# Patient Record
Sex: Female | Born: 1975
Health system: Southern US, Community
[De-identification: ages and names within clinical notes are randomized; demographics above are authoritative.]

## PROBLEM LIST (undated history)

## (undated) DIAGNOSIS — M1990 Unspecified osteoarthritis, unspecified site: Secondary | ICD-10-CM

## (undated) DIAGNOSIS — E785 Hyperlipidemia, unspecified: Secondary | ICD-10-CM

## (undated) DIAGNOSIS — K219 Gastro-esophageal reflux disease without esophagitis: Secondary | ICD-10-CM

## (undated) DIAGNOSIS — E119 Type 2 diabetes mellitus without complications: Secondary | ICD-10-CM

## (undated) DIAGNOSIS — Z800 Family history of malignant neoplasm of digestive organs: Secondary | ICD-10-CM

## (undated) DIAGNOSIS — Z1589 Genetic susceptibility to other disease: Secondary | ICD-10-CM

## (undated) DIAGNOSIS — Z8639 Personal history of other endocrine, nutritional and metabolic disease: Secondary | ICD-10-CM

## (undated) DIAGNOSIS — F32A Depression, unspecified: Secondary | ICD-10-CM

## (undated) DIAGNOSIS — D649 Anemia, unspecified: Secondary | ICD-10-CM

## (undated) DIAGNOSIS — C50919 Malignant neoplasm of unspecified site of unspecified female breast: Secondary | ICD-10-CM

## (undated) DIAGNOSIS — I10 Essential (primary) hypertension: Secondary | ICD-10-CM

## (undated) DIAGNOSIS — T7840XA Allergy, unspecified, initial encounter: Secondary | ICD-10-CM

## (undated) DIAGNOSIS — F339 Major depressive disorder, recurrent, unspecified: Secondary | ICD-10-CM

## (undated) DIAGNOSIS — F419 Anxiety disorder, unspecified: Secondary | ICD-10-CM

## (undated) DIAGNOSIS — R519 Headache, unspecified: Secondary | ICD-10-CM

## (undated) DIAGNOSIS — Z923 Personal history of irradiation: Secondary | ICD-10-CM

## (undated) HISTORY — PX: COLONOSCOPY: SHX174

## (undated) HISTORY — PX: BREAST CYST EXCISION: SHX579

## (undated) HISTORY — DX: Hyperlipidemia, unspecified: E78.5

## (undated) HISTORY — DX: Personal history of other endocrine, nutritional and metabolic disease: Z86.39

## (undated) HISTORY — DX: Family history of malignant neoplasm of digestive organs: Z80.0

## (undated) HISTORY — DX: Genetic susceptibility to other disease: Z15.89

## (undated) HISTORY — PX: THYROIDECTOMY, PARTIAL: SHX18

## (undated) HISTORY — PX: UPPER GASTROINTESTINAL ENDOSCOPY: SHX188

## (undated) HISTORY — DX: Malignant neoplasm of unspecified site of unspecified female breast: C50.919

## (undated) HISTORY — DX: Type 2 diabetes mellitus without complications: E11.9

## (undated) HISTORY — DX: Personal history of irradiation: Z92.3

## (undated) HISTORY — DX: Major depressive disorder, recurrent, unspecified: F33.9

## (undated) HISTORY — DX: Essential (primary) hypertension: I10

## (undated) HISTORY — DX: Allergy, unspecified, initial encounter: T78.40XA

## (undated) MED FILL — Dexamethasone Sodium Phosphate Inj 100 MG/10ML: INTRAMUSCULAR | Qty: 1 | Status: AC

---

## 2018-02-27 ENCOUNTER — Other Ambulatory Visit: Payer: Self-pay | Admitting: Family Medicine

## 2018-02-27 DIAGNOSIS — Z1231 Encounter for screening mammogram for malignant neoplasm of breast: Secondary | ICD-10-CM

## 2020-01-12 ENCOUNTER — Other Ambulatory Visit: Payer: Self-pay

## 2020-01-12 ENCOUNTER — Emergency Department (HOSPITAL_COMMUNITY)
Admission: EM | Admit: 2020-01-12 | Discharge: 2020-01-12 | Discharge status: Against Medical Advice | Payer: Self-pay | Attending: Emergency Medicine | Admitting: Emergency Medicine

## 2020-01-12 ENCOUNTER — Encounter (HOSPITAL_COMMUNITY): Payer: Self-pay | Admitting: Emergency Medicine

## 2020-01-12 DIAGNOSIS — R1084 Generalized abdominal pain: Secondary | ICD-10-CM | POA: Insufficient documentation

## 2020-01-12 DIAGNOSIS — Z5321 Procedure and treatment not carried out due to patient leaving prior to being seen by health care provider: Secondary | ICD-10-CM | POA: Insufficient documentation

## 2020-01-12 LAB — COMPREHENSIVE METABOLIC PANEL
ALT: 12 U/L (ref 0–44)
AST: 13 U/L — ABNORMAL LOW (ref 15–41)
Albumin: 3.8 g/dL (ref 3.5–5.0)
Alkaline Phosphatase: 58 U/L (ref 38–126)
Anion gap: 7 (ref 5–15)
BUN: 7 mg/dL (ref 6–20)
CO2: 25 mmol/L (ref 22–32)
Calcium: 8.6 mg/dL — ABNORMAL LOW (ref 8.9–10.3)
Chloride: 108 mmol/L (ref 98–111)
Creatinine, Ser: 0.67 mg/dL (ref 0.44–1.00)
GFR calc Af Amer: >60 mL/min (ref >60)
GFR calc non Af Amer: >60 mL/min (ref >60)
Glucose, Bld: 150 mg/dL — ABNORMAL HIGH (ref 70–99)
Potassium: 3.7 mmol/L (ref 3.5–5.1)
Sodium: 140 mmol/L (ref 135–145)
Total Bilirubin: 0.7 mg/dL (ref 0.3–1.2)
Total Protein: 7 g/dL (ref 6.5–8.1)

## 2020-01-12 LAB — CBC
HCT: 35.2 % — ABNORMAL LOW (ref 36.0–46.0)
Hemoglobin: 10.5 g/dL — ABNORMAL LOW (ref 12.0–15.0)
MCH: 25.1 pg — ABNORMAL LOW (ref 26.0–34.0)
MCHC: 29.8 g/dL — ABNORMAL LOW (ref 30.0–36.0)
MCV: 84 fL (ref 80.0–100.0)
Platelets: 277 10*3/uL (ref 150–400)
RBC: 4.19 MIL/uL (ref 3.87–5.11)
RDW: 15.8 % — ABNORMAL HIGH (ref 11.5–15.5)
WBC: 9.3 10*3/uL (ref 4.0–10.5)
nRBC: 0 % (ref 0.0–0.2)

## 2020-01-12 LAB — I-STAT BETA HCG BLOOD, ED (MC, WL, AP ONLY): I-stat hCG, quantitative: <5 m[IU]/mL (ref <5)

## 2020-01-12 LAB — LIPASE, BLOOD: Lipase: 23 U/L (ref 11–51)

## 2020-01-12 MED ORDER — SODIUM CHLORIDE 0.9% FLUSH: 3.0000 mL | Freq: Once | INTRAVENOUS | Status: DC

## 2020-01-12 NOTE — ED Notes (Addendum)
Called pt X3 no answer looked in bathrooms and outside of ED doors no answer as well.

## 2020-01-12 NOTE — ED Triage Notes (Signed)
Pt in w/generalized low abdominal pain and cramping x 5 days. Denies any n/v/d, just feels "tight". No constipation reported. Pain radiates to low back

## 2020-06-25 ENCOUNTER — Other Ambulatory Visit: Payer: Self-pay

## 2020-06-25 DIAGNOSIS — N6310 Unspecified lump in the right breast, unspecified quadrant: Secondary | ICD-10-CM

## 2020-07-07 ENCOUNTER — Ambulatory Visit: Payer: Self-pay | Admitting: *Deleted

## 2020-07-07 ENCOUNTER — Other Ambulatory Visit: Payer: Self-pay

## 2020-07-07 VITALS — BP 110/64 | Temp 97.8°F | Wt 207.0 lb

## 2020-07-07 DIAGNOSIS — Z1239 Encounter for other screening for malignant neoplasm of breast: Secondary | ICD-10-CM

## 2020-07-07 DIAGNOSIS — N6310 Unspecified lump in the right breast, unspecified quadrant: Secondary | ICD-10-CM

## 2020-07-07 DIAGNOSIS — N644 Mastodynia: Secondary | ICD-10-CM

## 2020-07-07 NOTE — Patient Instructions (Signed)
Explained breast self awareness with Armando Reichert. Patient did not need a Pap smear today due to last Pap smear and HPV typing was 05/26/2020. Let her know BCCCP will cover Pap smears and HPV typing every 5 years unless has a history of abnormal Pap smears. Referred patient to the [facility] for a diagnostic mammogram. Appointment scheduled Tuesday, July 13, 2020 at 0910. Patient aware of appointment and will be there. Discussed smoking cessation with patient. Referred to the [facility] Quitline and gave resources to the free smoking cessation classes at [facility]. Halleigh Comes verbalized understanding.  Romar Woodrick, Arvil Chaco, RN 1:56 PM

## 2020-07-07 NOTE — Progress Notes (Signed)
Ms. Melody Rodriguez is a 44 y.o. female who presents to [facility] clinic today with complaint of right breast lump x 2 years that has been increasing size. Patient complained of right breast pain x one year that is constant. Patient rates the pain at a 7 out of 10. Patient complained of bilateral nipple discharge when expresses x 10 years. Patient states the discharge is bloody to pus like within the left breast and pus like right breast.    Pap Smear: Pap smear not completed today. Last Pap smear was 05/26/2020 at the [facility] and was normal with negative HPV per patient. Per patient unsure if she has had an abnormal Pap smear. Last Pap smear result is not available in Epic. Previous Pap smear result from 01/01/2015 is in [place].   Physical exam: Breasts Breasts symmetrical. No skin abnormalities bilateral breasts. No nipple retraction bilateral breasts. No nipple discharge bilateral breasts. Unable to express any nipple discharge on exam. No lymphadenopathy. No lumps palpated left breast. Palpated a lump within the right breast between 9 o'clock and 12 o'clock 2 cm from the nipple. Patient complained of tenderness when palpated right breast lump.   Pelvic/Bimanual Pap is not indicated today per BCCCP guidelines.   Smoking History: Patient is a current smoker. Discussed smoking cessation with patient. Referred to the [name] Quitline and gave resources to the free smoking cessation classes at [facility].   Patient Navigation: Patient education provided. Access to services provided for patient through BCCCP program.    Breast and Cervical Cancer Risk Assessment: Patient does not have family history of breast cancer, known genetic mutations, or radiation treatment to the chest before age 30. Patient does not have history of cervical dysplasia, immunocompromised, or DES exposure in-utero.  Risk Assessment    Risk Scores      07/07/2020   Last edited by: Demetrius Revel, LPN   5-year risk: 0.8  %   Lifetime risk: 9.4 %          A: BCCCP exam without pap smear Complaint of right breast lump and pain.  P: Referred patient to the [facility] for a diagnostic mammogram. Appointment scheduled Tuesday, July 13, 2020 at 0910.  Loletta Parish, RN 07/07/2020 1:57 PM

## 2020-07-13 ENCOUNTER — Ambulatory Visit
Admission: RE | Admit: 2020-07-13 | Discharge: 2020-07-13 | Disposition: A | Discharge status: Home / Self Care | Payer: Medicaid Other | Source: Ambulatory Visit | Attending: Obstetrics and Gynecology | Admitting: Obstetrics and Gynecology

## 2020-07-13 ENCOUNTER — Other Ambulatory Visit: Payer: Self-pay | Admitting: Obstetrics and Gynecology

## 2020-07-13 DIAGNOSIS — N6310 Unspecified lump in the right breast, unspecified quadrant: Secondary | ICD-10-CM

## 2020-07-23 ENCOUNTER — Other Ambulatory Visit: Payer: Self-pay | Admitting: Obstetrics and Gynecology

## 2020-07-23 ENCOUNTER — Ambulatory Visit
Admission: RE | Admit: 2020-07-23 | Discharge: 2020-07-23 | Disposition: A | Discharge status: Home / Self Care | Payer: Medicaid Other | Source: Ambulatory Visit | Attending: Obstetrics and Gynecology | Admitting: Obstetrics and Gynecology

## 2020-07-23 ENCOUNTER — Ambulatory Visit
Admission: RE | Admit: 2020-07-23 | Discharge: 2020-07-23 | Disposition: A | Discharge status: Home / Self Care | Payer: No Typology Code available for payment source | Source: Ambulatory Visit | Attending: Obstetrics and Gynecology | Admitting: Obstetrics and Gynecology

## 2020-07-23 ENCOUNTER — Other Ambulatory Visit: Payer: Self-pay

## 2020-07-23 DIAGNOSIS — N6310 Unspecified lump in the right breast, unspecified quadrant: Secondary | ICD-10-CM

## 2020-07-26 ENCOUNTER — Telehealth: Payer: Self-pay | Admitting: *Deleted

## 2020-07-26 ENCOUNTER — Encounter: Payer: Self-pay | Admitting: *Deleted

## 2020-07-26 NOTE — Telephone Encounter (Signed)
Spoke with patient to discuss [facility] for 11/17 at 815am.  Discussed navigation resources and gave information regarding clinic. Patient verbalized understanding. Will email and mail packet for patient to fill out.

## 2020-07-27 ENCOUNTER — Telehealth: Payer: Self-pay

## 2020-07-27 NOTE — Telephone Encounter (Addendum)
Attempted to contact patient regarding BCCCP medicaid. Left message on voicemail requesting return call.   Patient returned call, application was completed, plans to come by [facility] tomorrow to sign Medicaid form. Patient has a very positive disposition, but is doubtful about having a supportive person/family members. Patient was encouraged to discuss with family members/friends, and to call back as needed. Patient stated she was meeting with some family members this week, and plans to discuss her diagnosis with her family members.

## 2020-07-28 ENCOUNTER — Other Ambulatory Visit: Payer: Self-pay | Admitting: *Deleted

## 2020-07-28 DIAGNOSIS — C50411 Malignant neoplasm of upper-outer quadrant of right female breast: Secondary | ICD-10-CM | POA: Insufficient documentation

## 2020-07-28 DIAGNOSIS — Z170 Estrogen receptor positive status [ER+]: Secondary | ICD-10-CM | POA: Insufficient documentation

## 2020-08-03 NOTE — Progress Notes (Signed)
[facility] NOTE  Patient Care Team: Patient, [name] as PCP - General (General Practice) Rockwell Germany, RN as Oncology Nurse Navigator Mauro Kaufmann, RN as Oncology Nurse Navigator [name], MD as Consulting Physician (General Surgery) Nicholas Lose, MD as Consulting Physician (Hematology and Oncology) Gery Pray, MD as Consulting Physician (Radiation Oncology)  CHIEF COMPLAINTS/PURPOSE OF CONSULTATION:  Newly diagnosed breast cancer  HISTORY OF PRESENTING ILLNESS:  Melody Rodriguez 44 y.o. female is here because of recent diagnosis of invasive ductal carcinoma of the right breast. Patient palpated a right breast mass for 1-2 years. Mammogram and [place] on 07/13/20 showed a 2.2cm mass at the 11 o'clock position with surrounding calcifications, 6.4cm in total extent, and up to 5 abnormal right axillary lymph nodes. Biopsy on 07/23/20 showed invasive and in situ ductal carcinoma in the breast and axilla, grade 2, HER-2 equivocal by IHC (2+), negative by FISH (ratio 1.6), ER+ 50% weak, PR+ 20%, Ki67 20%. She presents to the clinic today for initial evaluation and discussion of treatment options.   I reviewed her records extensively and collaborated the history with the patient.  SUMMARY OF ONCOLOGIC HISTORY: Oncology History  Malignant neoplasm of upper-outer quadrant of right breast in female, estrogen receptor positive ([place])  07/28/2020 Initial Diagnosis   Patient palpated a right breast mass for 1-2 years. Mammogram showed a 2.2cm mass at the 11 o'clock position with surrounding calcifications, 6.4cm in total extent, and up to 5 abnormal right axillary lymph nodes. 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      MEDICAL HISTORY:  Past Medical History:  Diagnosis Date  . Breast cancer [facility])     SURGICAL  HISTORY: Past Surgical History:  Procedure Laterality Date  . BREAST CYST EXCISION Right    Patient does not recall (2014 or 2015)  . CESAREAN SECTION    . THYROIDECTOMY, PARTIAL      SOCIAL HISTORY: Social History   Socioeconomic History  . Marital status: Single    Spouse name: Not on file  . Number of children: 2  . Years of education: Not on file  . Highest education level: Some college, no degree  Occupational History  . Not on file  Tobacco Use  . Smoking status: Current Every Day Smoker    Packs/day: 1.00    Types: Cigars  . Smokeless tobacco: Never Used  Vaping Use  . Vaping Use: Never used  Substance and Sexual Activity  . Alcohol use: Yes  . Drug use: Yes    Types: Marijuana  . Sexual activity: Not Currently  Other Topics Concern  . Not on file  Social History Narrative  . Not on file   Social Determinants of Health   Financial Resource Strain:   . Difficulty of Paying Living Expenses: Not on file  Food Insecurity:   . Worried About Charity fundraiser in the Last Year: Not on file  . Ran Out of Food in the Last Year: Not on file  Transportation Needs: No Transportation Needs  . Lack of Transportation (Medical): No  . Lack of Transportation (Non-Medical): No  Physical Activity:   . Days of Exercise per Week: Not on file  . Minutes of Exercise per Session: Not on file  Stress:   . Feeling of Stress : Not on file  Social  Connections:   . Frequency of Communication with Friends and Family: Not on file  . Frequency of Social Gatherings with Friends and Family: Not on file  . Attends Religious Services: Not on file  . Active Member of Clubs or Organizations: Not on file  . Attends Archivist Meetings: Not on file  . Marital Status: Not on file  Intimate Partner Violence:   . Fear of Current or Ex-Partner: Not on file  . Emotionally Abused: Not on file  . Physically Abused: Not on file  . Sexually Abused: Not on file    FAMILY  HISTORY: Family History  Problem Relation Age of Onset  . Hypertension Mother   . Colon cancer Sister   . Diabetes Maternal Grandmother     ALLERGIES:  is allergic to latex.  MEDICATIONS:  Current Outpatient Medications  Medication Sig Dispense Refill  . tamoxifen (NOLVADEX) 20 MG tablet Take 1 tablet (20 mg total) by mouth daily. 90 tablet 3   No current facility-administered medications for this visit.    REVIEW OF SYSTEMS:   Constitutional: Denies fevers, chills or abnormal night sweats All other systems were reviewed with the patient and are negative.  PHYSICAL EXAMINATION: ECOG PERFORMANCE STATUS: 1 - Symptomatic but completely ambulatory  Vitals:   08/04/20 0936  BP: 127/73  Pulse: 78  Resp: 18  Temp: 98.3 F (36.8 C)  SpO2: 99%   Filed Weights   08/04/20 0936  Weight: 204 lb (92.5 kg)      LABORATORY DATA:  I have reviewed the data as listed Lab Results  Component Value Date   WBC 7.0 08/04/2020   HGB 9.6 (L) 08/04/2020   HCT 31.6 (L) 08/04/2020   MCV 77.3 (L) 08/04/2020   PLT 290 08/04/2020   Lab Results  Component Value Date   NA 141 08/04/2020   K 3.4 (L) 08/04/2020   CL 108 08/04/2020   CO2 24 08/04/2020    RADIOGRAPHIC STUDIES: I have personally reviewed the radiological reports and agreed with the findings in the report.  ASSESSMENT AND PLAN:  Malignant neoplasm of upper-outer quadrant of right breast in female, estrogen receptor positive ([place]) 07/28/2020:Patient palpated a right breast mass for 1-2 years. Mammogram showed a 2.2cm mass at the 11 o'clock position with surrounding calcifications, 6.4cm in total extent, and up to 5 abnormal right axillary lymph nodes. 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   Pathology and radiology counseling: Discussed with the patient, the details of pathology including  the type of breast cancer,the clinical staging, the significance of ER, PR and HER-2/neu receptors and the implications for treatment. After reviewing the pathology in detail, we proceeded to discuss the different treatment options between surgery, radiation, chemotherapy, antiestrogen therapies.  Treatment plan: 1.  Neoadjuvant therapy (neoadjuvant antiestrogen therapy versus neoadjuvant chemotherapy based upon MammaPrint test results) 2. mastectomy versus breast conserving surgery with targeted node dissection 3.  Adjuvant radiation therapy 4.  Follow-up adjuvant antiestrogen therapy  Tamoxifen counseling:We discussed the risks and benefits of tamoxifen. These include but not limited to insomnia, hot flashes, mood changes, vaginal dryness, and weight gain. Although rare, serious side effects including endometrial cancer, risk of blood clots were also discussed. We strongly believe that the benefits far outweigh the risks. Patient understands these risks and consented to starting treatment.    Heavy menses will  refer her to gynecology.  Iron deficiency anemia: I recommend administering IV iron therapy. We will obtain iron studies and ferritin. Since her mother has a history of von Willebrand disease, we will obtain von Willebrand factor panel.   All questions were answered. The patient knows to call the clinic with any problems, questions or concerns.    Rulon Eisenmenger, MD, MPH 08/04/2020    I, Molly Dorshimer, am acting as scribe for Nicholas Lose, MD.  I have reviewed the above documentation for accuracy and completeness, and I agree with the above.

## 2020-08-04 ENCOUNTER — Encounter: Payer: Self-pay | Admitting: Hematology and Oncology

## 2020-08-04 ENCOUNTER — Ambulatory Visit (HOSPITAL_BASED_OUTPATIENT_CLINIC_OR_DEPARTMENT_OTHER): Payer: No Typology Code available for payment source | Admitting: Genetic Counselor

## 2020-08-04 ENCOUNTER — Inpatient Hospital Stay: Payer: Medicaid Other

## 2020-08-04 ENCOUNTER — Encounter: Payer: Self-pay | Admitting: Genetic Counselor

## 2020-08-04 ENCOUNTER — Encounter: Payer: Self-pay | Admitting: Physical Therapy

## 2020-08-04 ENCOUNTER — Encounter: Payer: Self-pay | Admitting: *Deleted

## 2020-08-04 ENCOUNTER — Inpatient Hospital Stay: Payer: Medicaid Other | Attending: Hematology and Oncology | Admitting: Hematology and Oncology

## 2020-08-04 ENCOUNTER — Inpatient Hospital Stay: Payer: Medicaid Other | Admitting: Licensed Clinical Social Worker

## 2020-08-04 ENCOUNTER — Other Ambulatory Visit: Payer: Self-pay

## 2020-08-04 ENCOUNTER — Other Ambulatory Visit: Payer: Self-pay | Admitting: *Deleted

## 2020-08-04 ENCOUNTER — Ambulatory Visit: Payer: No Typology Code available for payment source

## 2020-08-04 ENCOUNTER — Ambulatory Visit: Payer: Medicaid Other | Attending: Surgery | Admitting: Physical Therapy

## 2020-08-04 ENCOUNTER — Ambulatory Visit
Admission: RE | Admit: 2020-08-04 | Discharge: 2020-08-04 | Disposition: A | Discharge status: Home / Self Care | Payer: Medicaid Other | Source: Ambulatory Visit | Attending: Radiation Oncology | Admitting: Radiation Oncology

## 2020-08-04 VITALS — BP 127/73 | HR 78 | Temp 98.3°F | Resp 18 | Ht 65.0 in | Wt 204.0 lb

## 2020-08-04 DIAGNOSIS — R3589 Other polyuria: Secondary | ICD-10-CM | POA: Diagnosis not present

## 2020-08-04 DIAGNOSIS — D500 Iron deficiency anemia secondary to blood loss (chronic): Secondary | ICD-10-CM

## 2020-08-04 DIAGNOSIS — C50411 Malignant neoplasm of upper-outer quadrant of right female breast: Secondary | ICD-10-CM

## 2020-08-04 DIAGNOSIS — Z800 Family history of malignant neoplasm of digestive organs: Secondary | ICD-10-CM

## 2020-08-04 DIAGNOSIS — R293 Abnormal posture: Secondary | ICD-10-CM | POA: Diagnosis present

## 2020-08-04 DIAGNOSIS — D508 Other iron deficiency anemias: Secondary | ICD-10-CM | POA: Diagnosis not present

## 2020-08-04 DIAGNOSIS — Z79810 Long term (current) use of selective estrogen receptor modulators (SERMs): Secondary | ICD-10-CM | POA: Insufficient documentation

## 2020-08-04 DIAGNOSIS — Z833 Family history of diabetes mellitus: Secondary | ICD-10-CM | POA: Insufficient documentation

## 2020-08-04 DIAGNOSIS — R58 Hemorrhage, not elsewhere classified: Secondary | ICD-10-CM

## 2020-08-04 DIAGNOSIS — Z170 Estrogen receptor positive status [ER+]: Secondary | ICD-10-CM | POA: Insufficient documentation

## 2020-08-04 DIAGNOSIS — Z8249 Family history of ischemic heart disease and other diseases of the circulatory system: Secondary | ICD-10-CM | POA: Diagnosis not present

## 2020-08-04 DIAGNOSIS — N920 Excessive and frequent menstruation with regular cycle: Secondary | ICD-10-CM | POA: Diagnosis not present

## 2020-08-04 DIAGNOSIS — F17210 Nicotine dependence, cigarettes, uncomplicated: Secondary | ICD-10-CM | POA: Diagnosis not present

## 2020-08-04 LAB — CBC WITH DIFFERENTIAL (CANCER CENTER ONLY)
Abs Immature Granulocytes: 0.03 10*3/uL (ref 0.00–0.07)
Basophils Absolute: 0.1 10*3/uL (ref 0.0–0.1)
Basophils Relative: 1 %
Eosinophils Absolute: 0.3 10*3/uL (ref 0.0–0.5)
Eosinophils Relative: 4 %
HCT: 31.6 % — ABNORMAL LOW (ref 36.0–46.0)
Hemoglobin: 9.6 g/dL — ABNORMAL LOW (ref 12.0–15.0)
Immature Granulocytes: 0 %
Lymphocytes Relative: 26 %
Lymphs Abs: 1.8 10*3/uL (ref 0.7–4.0)
MCH: 23.5 pg — ABNORMAL LOW (ref 26.0–34.0)
MCHC: 30.4 g/dL (ref 30.0–36.0)
MCV: 77.3 fL — ABNORMAL LOW (ref 80.0–100.0)
Monocytes Absolute: 0.3 10*3/uL (ref 0.1–1.0)
Monocytes Relative: 5 %
Neutro Abs: 4.5 10*3/uL (ref 1.7–7.7)
Neutrophils Relative %: 64 %
Platelet Count: 290 10*3/uL (ref 150–400)
RBC: 4.09 MIL/uL (ref 3.87–5.11)
RDW: 17.2 % — ABNORMAL HIGH (ref 11.5–15.5)
WBC Count: 7 10*3/uL (ref 4.0–10.5)
nRBC: 0 % (ref 0.0–0.2)

## 2020-08-04 LAB — CMP (CANCER CENTER ONLY)
ALT: 10 U/L (ref 0–44)
AST: 13 U/L — ABNORMAL LOW (ref 15–41)
Albumin: 3.7 g/dL (ref 3.5–5.0)
Alkaline Phosphatase: 59 U/L (ref 38–126)
Anion gap: 9 (ref 5–15)
BUN: 10 mg/dL (ref 6–20)
CO2: 24 mmol/L (ref 22–32)
Calcium: 8.6 mg/dL — ABNORMAL LOW (ref 8.9–10.3)
Chloride: 108 mmol/L (ref 98–111)
Creatinine: 0.77 mg/dL (ref 0.44–1.00)
GFR, Estimated: >60 mL/min (ref >60)
Glucose, Bld: 191 mg/dL — ABNORMAL HIGH (ref 70–99)
Potassium: 3.4 mmol/L — ABNORMAL LOW (ref 3.5–5.1)
Sodium: 141 mmol/L (ref 135–145)
Total Bilirubin: 0.3 mg/dL (ref 0.3–1.2)
Total Protein: 6.9 g/dL (ref 6.5–8.1)

## 2020-08-04 LAB — IRON AND TIBC
Iron: 20 ug/dL — ABNORMAL LOW (ref 41–142)
Saturation Ratios: 5 % — ABNORMAL LOW (ref 21–57)
TIBC: 420 ug/dL (ref 236–444)
UIBC: 400 ug/dL — ABNORMAL HIGH (ref 120–384)

## 2020-08-04 LAB — FERRITIN: Ferritin: <4 ng/mL — ABNORMAL LOW (ref 11–307)

## 2020-08-04 LAB — GENETIC SCREENING ORDER

## 2020-08-04 MED ORDER — TAMOXIFEN CITRATE 20 MG PO TABS
20.0000 mg | ORAL_TABLET | Freq: Every day | ORAL | 3 refills | Status: DC
Start: 2020-08-04 — End: 2020-08-05

## 2020-08-04 NOTE — Progress Notes (Signed)
[place] Work  Initial Assessment   Melody Rodriguez is a 44 y.o. year old female presenting by herself. Clinical Social Work was referred by [facility] for assessment of psychosocial needs.   SDOH (Social Determinants of Health) assessments performed: Yes SDOH Interventions     Most Recent Value  SDOH Interventions  Food Insecurity Interventions Intervention Not Indicated  Financial Strain Interventions Development worker, community, Other (Comment)  [breast cancer foundations,  BCCCP Medicaid pending]      Distress Screen completed: Yes ONCBCN DISTRESS SCREENING 08/04/2020  Screening Type Initial Screening  Distress experienced in past week (1-10) 7  Practical problem type Insurance;Work/school;Childcare  Family Problem type Other (comment)  Emotional problem type Depression;Boredom  Spiritual/Religous concerns type Relating to God  Information Concerns Type Lack of info about diagnosis;Lack of info about treatment;Lack of info about complementary therapy choices  Physical Problem type Pain;Sleep/insomnia;Breathing;Loss of appetitie;Talking;Constipation/diarrhea;Changes in urination;Tingling hands/feet;Skin dry/itchy;Swollen arms/legs;Other (comment)      Family/Social Information:  . Housing Arrangement: patient lives with 10yo son who has Autism. 41 yo son lives in [place] . Family members/support persons in your life? Extremely limited. Has some family (mom, aunts) but they live in [place] . Transportation concerns: no  . Employment: Working part time for [employer]. Income source: Employment and son's SSI . Financial concerns: Yes, current concerns and will worsen if missing working hours due to treatment o Type of concern: Utilities, Rent/ mortgage, Medical bills and Care giving (Child care or elder care services) . Food access concerns: no, receiving enough SNAP benefits at this time . Medication Concerns: yes, worried about cost since BCCCP Medicaid is pending approval   . Services Currently in place:  SNAP, SSI (for son)  Coping/ Adjustment to diagnosis: . Patient understands treatment plan and what happens next? Understands current plan although is waiting on results from Mammaprint and MRI to determine exact plan . Concerns about diagnosis and/or treatment: How I will care for other members of my family and How will I pay for living expenses when missing work . 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 Hopes and priorities: priority is making sure her son is taken care of . Current coping skills/ strengths: Capable of independent living    SUMMARY: Current SDOH Barriers:  . Financial constraints related to losing hours for work due to treatment . Limited social support  Clinical Social Work Clinical Goal(s):  Marland Kitchen Patient will follow-up on resources provided by CSW for financial assistance  Interventions: . Discussed common feeling and emotions when being diagnosed with cancer, and the importance of support during treatment . Informed patient of the support team roles and support services at [facility] . Provided CSW contact information and encouraged patient to call with any questions or concerns . Provided patient with information about breast cancer foundations   Follow Up Plan: CSW will follow-up with patient by phone  and Patient will work on applications for breast cancer foundations Patient verbalizes understanding of plan: Yes    Christeen Douglas LCSW

## 2020-08-04 NOTE — Progress Notes (Signed)
REFERRING PROVIDER: Nicholas Lose, MD [place],  [place] [id number]  PRIMARY PROVIDER:  Patient, [name]  PRIMARY REASON FOR VISIT:  1. Malignant neoplasm of upper-outer quadrant of right breast in female, estrogen receptor positive ([place])   2. Family history of colon cancer      I connected with Melody Rodriguez on 08/04/2020 at 12:15 pm EDT by video conference and verified that I am speaking with the correct person using two identifiers.   Patient location: [facility] clinic Provider location: [facility] office  HISTORY OF PRESENT ILLNESS:   Melody Rodriguez, a 44 y.o. female, was seen for a [facility] cancer genetics consultation at the request of Dr. Lindi Adie due to a personal and family history of cancer.  Melody Rodriguez presents to clinic today to discuss the possibility of a hereditary predisposition to cancer, genetic testing, and to further clarify her future cancer risks, as well as potential cancer risks for family members.   In 2021, at the age of 18, Melody Rodriguez was diagnosed with invasive ductal carcinoma and ductal carcinoma in situ, ER+/PR+/Her2-, of the right breast. The treatment plan includes surgery, radiation therapy, and antiestrogen therapy.    CANCER HISTORY:  Oncology History  Malignant neoplasm of upper-outer quadrant of right breast in female, estrogen receptor positive ([place])  07/28/2020 Initial Diagnosis   Patient palpated a right breast mass for 1-2 years. Mammogram showed a 2.2cm mass at the 11 o'clock position with surrounding calcifications, 6.4cm in total extent, and up to 5 abnormal right axillary lymph nodes. 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       Past Medical History:  Diagnosis Date  . Breast cancer ([place])   . Family history of colon cancer     Past Surgical History:  Procedure Laterality Date  .  BREAST CYST EXCISION Right    Patient does not recall (2014 or 2015)  . CESAREAN SECTION    . THYROIDECTOMY, PARTIAL      Social History   Socioeconomic History  . Marital status: Single    Spouse name: Not on file  . Number of children: 2  . Years of education: Not on file  . Highest education level: Some college, no degree  Occupational History  . Not on file  Tobacco Use  . Smoking status: Current Every Day Smoker    Packs/day: 1.00    Types: Cigars  . Smokeless tobacco: Never Used  Vaping Use  . Vaping Use: Never used  Substance and Sexual Activity  . Alcohol use: Yes  . Drug use: Yes    Types: Marijuana  . Sexual activity: Not Currently  Other Topics Concern  . Not on file  Social History Narrative  . Not on file   Social Determinants of Health   Financial Resource Strain:   . Difficulty of Paying Living Expenses: Not on file  Food Insecurity:   . Worried About Charity fundraiser in the Last Year: Not on file  . Ran Out of Food in the Last Year: Not on file  Transportation Needs: No Transportation Needs  . Lack of Transportation (Medical): No  . Lack of Transportation (Non-Medical): No  Physical Activity:   . Days of Exercise per Week: Not on file  . Minutes of Exercise per Session: Not on file  Stress:   . Feeling  of Stress : Not on file  Social Connections:   . Frequency of Communication with Friends and Family: Not on file  . Frequency of Social Gatherings with Friends and Family: Not on file  . Attends Religious Services: Not on file  . Active Member of Clubs or Organizations: Not on file  . Attends Archivist Meetings: Not on file  . Marital Status: Not on file     FAMILY HISTORY:  We obtained a detailed, 4-generation family history.  Significant diagnoses are listed below: Family History  Problem Relation Age of Onset  . Hypertension Mother   . Colon cancer Sister 10  . Diabetes Maternal Grandmother   . Cancer Maternal Aunt         unknown type dx late 40s   Melody Rodriguez has two sons (ages 7 and 50). She has one sister (age 42) who has a history of colon cancer diagnosed at the age of 58. She does not know if her sister has had genetic testing.  Melody Rodriguez mother is 36 and has not had cancer. Melody Rodriguez has two maternal uncles and six maternal aunts. One aunt was diagnosed with cancer in her late 42s, although Melody Rodriguez does not know what type of cancer this was. Her maternal grandmother died at age 14 and her maternal grandfather died in his early 16s.  Melody Rodriguez father is 58 and has not had cancer. She does not know if she has any paternal aunts or uncles. Her paternal grandmother died older than 56, and she does not know how old her paternal grandfather was when he died.  Melody Rodriguez is [place] of previous family history of genetic testing for hereditary cancer risks. Patient's ancestors are of Black/African American, Native American, and White/Caucasian descent. There is no reported Ashkenazi Jewish ancestry. There is no known consanguinity.  GENETIC COUNSELING ASSESSMENT: Melody Rodriguez is a 44 y.o. female with a personal history of young-onset breast cancer and a family history of young-onset colon cancer, which is somewhat suggestive of a hereditary cancer syndrome and predisposition to cancer. We, therefore, discussed and recommended the following at today's visit.   DISCUSSION: We discussed that approximately 5-10% of breast cancer is hereditary, with most cases associated with the BRCA1 and BRCA2 genes. There are other genes that can be associated with hereditary breast cancer syndromes. These include ATM, CHEK2, PALB2, etc. We discussed that testing is beneficial for several reasons, including knowing about other cancer risks, identifying potential screening and risk-reduction options that may be appropriate, and to understand if other family members could be at risk for cancer and allow them to undergo genetic  testing.  We reviewed the characteristics, features and inheritance patterns of hereditary cancer syndromes. We also discussed genetic testing, including the appropriate family members to test, the process of testing, insurance coverage and turn-around-time for results. We discussed the implications of a negative, positive and/or variant of uncertain significant result. In order to get genetic test results in a timely manner so that Melody Rodriguez can use these genetic test results for surgical decisions, we recommended Melody Rodriguez pursue genetic testing for the [facility] Breast Cancer STAT panel. Once complete, we recommend Melody Rodriguez pursue reflex genetic testing to the Common Hereditary Cancers panel.   The Breast Cancer STAT Panel offered by [facility] includes sequencing and deletion/duplication analysis for the following 9 genes:  ATM, BRCA1, BRCA2, CDH1, CHEK2, PALB2, PTEN, STK11 and TP53. The Common Hereditary Cancers Panel offered by [facility] includes sequencing and/or  deletion duplication testing of the following 48 genes: APC, ATM, AXIN2, BARD1, BMPR1A, BRCA1, BRCA2, BRIP1, CDH1, CDK4, CDKN2A (p14ARF), CDKN2A (p16INK4a), CHEK2, CTNNA1, DICER1, EPCAM (Deletion/duplication testing only), GREM1 (promoter region deletion/duplication testing only), KIT, MEN1, MLH1, MSH2, MSH3, MSH6, MUTYH, NBN, NF1, NTHL1, PALB2, PDGFRA, PMS2, POLD1, POLE, PTEN, RAD50, RAD51C, RAD51D, RNF43, SDHB, SDHC, SDHD, SMAD4, SMARCA4. STK11, TP53, TSC1, TSC2, and VHL.  The following genes were evaluated for sequence changes only: SDHA and HOXB13 c.251G>A variant only.  Based on MelodyRodriguez's personal and family history of cancer, she meets medical criteria for genetic testing. Despite that she meets criteria, she may still have an out of pocket cost. We discussed that she qualifies for [facility]'s patient assistance program, which will waive the cost of her genetic testing if she provides her most recent federal tax return form. Otherwise, the  out of pocket cost may be $250.   PLAN: After considering the risks, benefits, and limitations, Melody Rodriguez provided informed consent to pursue genetic testing and the blood sample was sent to [facility] for analysis of the Breast Cancer STAT panel + Common Hereditary Cancers panel. Results should be available within approximately one-two weeks' time, at which point they will be disclosed by telephone to Melody Rodriguez, as will any additional recommendations warranted by these results. Melody Rodriguez will receive a summary of her genetic counseling visit and a copy of her results once available. This information will also be available in Epic.   Ms. Milholland questions were answered to her satisfaction today. Our contact information was provided should additional questions or concerns arise. Thank you for the referral and allowing [place] to share in the care of your patient.   Clint Guy, [place], [facility] Licensed, Certified Dispensing [email or web address] Phone: [phone]  The patient was seen for a total of 20 minutes in face-to-face genetic counseling.  This patient was discussed with Drs. Magrinat, Lindi Adie and/or Burr Medico who agrees with the above.    _______________________________________________________________________ For Office Staff:  Number of people involved in session: 1 Was an Intern/ student involved with case: no

## 2020-08-04 NOTE — Progress Notes (Signed)
Radiation Oncology         [phone] ________________________________  Multidisciplinary Breast Oncology Clinic [facility]) Initial Outpatient Consultation  Name: Melody Rodriguez MRN: [id number]  Date: 08/04/2020  DOB: 14-Apr-1976  YN:[id number], [name]  [name], MD   REFERRING PHYSICIAN: [name], MD  DIAGNOSIS: The encounter diagnosis was Malignant neoplasm of upper-outer quadrant of right breast in female, estrogen receptor positive ([place]).  Stage T2, N1, Mx Right Breast UOQ, Invasive Ductal Carcinoma with DCIS, ER+ / PR+ / Her2-, Grade 2    ICD-10-CM   1. Malignant neoplasm of upper-outer quadrant of right breast in female, estrogen receptor positive ([place])  C50.411    Z17.0     HISTORY OF PRESENT ILLNESS::Melody Rodriguez is a 44 y.o. female who is presenting to the office today for evaluation of her newly diagnosed breast cancer.  She underwent bilateral diagnostic mammography with tomography and right breast ultrasonography at [facility] on 07/13/2020 for evaluation of 1-2 year history of palpable right breast mass. Results showed a highly suspicious 2.2 cm mass in the right breast at the 11 o'clock position. There were calcifications within the mass that extended posteriorly and laterally to the mass, which altogether spanned 6.3 cm. Ultrasound of the right axilla demonstrated two clearly abnormal lymph nodes and three additional possible abnormal lymph nodes. There was no evidence of left breast malignancy.  Biopsy on 07/23/2020 showed grade 2 invasive ductal carcinoma and ductal carcinoma in situ with a right axillary lymph node that was positive for invasive carcinoma. Prognostic indicators were significant for: estrogen receptor, 50% positive with a weak staining intensity and progesterone receptor, 20% positive with a strong staining intensity. Proliferation marker Ki67 at 20%. HER2 negative.  Menarche: 44 years old Age at first live birth: 44years old GP:  2 LMP: 08/01/2020 Contraceptive: None HRT: None   The patient was referred today for presentation in the multidisciplinary conference.  Radiology studies and pathology slides were presented there for review and discussion of treatment options.  A consensus was discussed regarding potential next steps.  PREVIOUS RADIATION THERAPY: No  PAST MEDICAL HISTORY:  Past Medical History:  Diagnosis Date  . Breast cancer ([place])   . Family history of colon cancer     PAST SURGICAL HISTORY: Past Surgical History:  Procedure Laterality Date  . BREAST CYST EXCISION Right    Patient does not recall (2014 or 2015)  . CESAREAN SECTION    . THYROIDECTOMY, PARTIAL      FAMILY HISTORY:  Family History  Problem Relation Age of Onset  . Hypertension Mother   . Colon cancer Sister 31  . Diabetes Maternal Grandmother   . Cancer Maternal Aunt        unknown type dx late 1s    SOCIAL HISTORY:  Social History   Socioeconomic History  . Marital status: Single    Spouse name: Not on file  . Number of children: 2  . Years of education: Not on file  . Highest education level: Some college, no degree  Occupational History  . Not on file  Tobacco Use  . Smoking status: Current Every Day Smoker    Packs/day: 1.00    Types: Cigars  . Smokeless tobacco: Never Used  Vaping Use  . Vaping Use: Never used  Substance and Sexual Activity  . Alcohol use: Yes  . Drug use: Yes    Types: Marijuana  . Sexual activity: Not Currently  Other Topics Concern  . Not on file  Social History Narrative  . Not on file   Social Determinants of Health   Financial Resource Strain: High Risk  . Difficulty of Paying Living Expenses: Hard  Food Insecurity: No Food Insecurity  . Worried About Charity fundraiser in the Last Year: Never true  . Ran Out of Food in the Last Year: Never true  Transportation Needs: No Transportation Needs  . Lack of Transportation (Medical): No  . Lack of Transportation  (Non-Medical): No  Physical Activity:   . Days of Exercise per Week: Not on file  . Minutes of Exercise per Session: Not on file  Stress:   . Feeling of Stress : Not on file  Social Connections:   . Frequency of Communication with Friends and Family: Not on file  . Frequency of Social Gatherings with Friends and Family: Not on file  . Attends Religious Services: Not on file  . Active Member of Clubs or Organizations: Not on file  . Attends Archivist Meetings: Not on file  . Marital Status: Not on file    ALLERGIES:  Allergies  Allergen Reactions  . Latex Swelling    Itching, swelling    MEDICATIONS:  Current Outpatient Medications  Medication Sig Dispense Refill  . tamoxifen (NOLVADEX) 20 MG tablet Take 1 tablet (20 mg total) by mouth daily. 90 tablet 3   No current facility-administered medications for this encounter.    REVIEW OF SYSTEMS: A 10+ POINT REVIEW OF SYSTEMS WAS OBTAINED including neurology, dermatology, psychiatry, cardiac, respiratory, lymph, extremities, GI, GU, musculoskeletal, constitutional, reproductive, HEENT. On the provided form, she reports night sweats, fatigue, generalized pain and muscle aches, wearing glasses and contacts, ear drainage, tinnitus, rhinorrhea, sinus problems, hoarse voice, chest pain, pedal edema, shortness of breath with rest/walking/stairs, sleeping with 3-4 pillows, cough, heartburn, change in stool habits, abdominal pain, breast pain, lump in breast, nipple discharge, breast dimpling, rash, back pain, joint pain, headaches, weakness, numbness, depression, thyroid problem, hot flashes, and anemia. She denies dysuria and any other symptoms.    PHYSICAL EXAM:   Vitals with BMI 08/04/2020  Height _0   Weight 204 lbs  BMI 86.57  Systolic 846  Diastolic 73  Pulse 78   Lungs are clear to auscultation bilaterally. Heart has regular rate and rhythm. No palpable cervical, supraclavicular, or axillary adenopathy. Abdomen soft,  non-tender, normal bowel sounds. Left breast with no palpable mass, nipple discharge, or bleeding.  Right breast with a palpable mass in the periareolar area that measured approximately 2.0 - 2.5 cm in size. There was no nipple discharge or bleeding. No palpable adenopathy.   KPS = 90  100 - Normal; no complaints; no evidence of disease. 90   - Able to carry on normal activity; minor signs or symptoms of disease. 80   - Normal activity with effort; some signs or symptoms of disease. 77   - Cares for self; unable to carry on normal activity or to do active work. 60   - Requires occasional assistance, but is able to care for most of his personal needs. 50   - Requires considerable assistance and frequent medical care. 86   - Disabled; requires special care and assistance. 60   - Severely disabled; hospital admission is indicated although death not imminent. 83   - Very sick; hospital admission necessary; active supportive treatment necessary. 10   - Moribund; fatal processes progressing rapidly. 0     - Dead  Karnofsky DA, Abelmann WH, Craver LS and  Burchenal JH [phone]) The use of the nitrogen mustards in the palliative treatment of carcinoma: with particular reference to bronchogenic carcinoma Cancer 1 634-56  LABORATORY DATA:  Lab Results  Component Value Date   WBC 7.0 08/04/2020   HGB 9.6 (L) 08/04/2020   HCT 31.6 (L) 08/04/2020   MCV 77.3 (L) 08/04/2020   PLT 290 08/04/2020   Lab Results  Component Value Date   NA 141 08/04/2020   K 3.4 (L) 08/04/2020   CL 108 08/04/2020   CO2 24 08/04/2020   Lab Results  Component Value Date   ALT 10 08/04/2020   AST 13 (L) 08/04/2020   ALKPHOS 59 08/04/2020   BILITOT 0.3 08/04/2020    PULMONARY FUNCTION TEST:   Recent Review Flowsheet Data   There is no flowsheet data to display.     RADIOGRAPHY: US BREAST LTD UNI RIGHT INC AXILLA  Result Date: 07/13/2020 CLINICAL DATA:  44 year old female presenting for evaluation of a  palpable lump in the right breast present for 1-2 years. EXAM: DIGITAL DIAGNOSTIC BILATERAL MAMMOGRAM WITH TOMO AND CAD; ULTRASOUND RIGHT BREAST LIMITED COMPARISON:  None. The patient apparently has prior mammograms, but they are unable to be retrieved. ACR Breast Density Category b: There are scattered areas of fibroglandular density. FINDINGS: Spot compression tomosynthesis images through the lateral middle depth of the right breast demonstrates a highly suspicious spiculated mass measuring approximately 2.5 cm. There are associated amorphous and fine punctate calcifications throughout the mass and extending into the lateral posterior right breast. All together, the mass and the calcifications span about 6.4 cm. No suspicious calcifications, masses or areas of distortion are seen in the left breast. Mammographic images were processed with CAD. Ultrasound targeted to the right breast at 11 o'clock, 4 cm from the nipple demonstrates an irregular hypoechoic vascular mass measuring 2.2 x 1.9 x 2.0 cm. Ultrasound of the right axilla demonstrates at least 2 clearly abnormal and 3 suspicious but borderline lymph nodes. IMPRESSION: 1. There is a highly suspicious 2.2 cm mass in the right breast at 11 o'clock. There are calcifications within the mass and extending posterior and lateral to the mass, which altogether spans about 6.4 cm. 2. Ultrasound of the right axilla demonstrates 2 clearly abnormal lymph nodes, and 3 additional possible abnormal lymph nodes. 3.  No evidence of left breast malignancy. RECOMMENDATION: 1. Ultrasound-guided biopsy is recommended for the right breast mass at 11 o'clock and the most abnormal appearing right axillary lymph node. 2. Stereotactic biopsy is recommended for a distant site of calcifications in the upper-outer right breast. These procedures have been scheduled for 07/23/2020 at 7:30 a.m. I have discussed the findings and recommendations with the patient. If applicable, a reminder  letter will be sent to the patient regarding the next appointment. BI-RADS CATEGORY  5: Highly suggestive of malignancy. Electronically Signed   By: Ammie Ferrier M.D.   On: 07/13/2020 11:28   [place] AXILLARY NODE CORE BIOPSY RIGHT  Addendum Date: 07/26/2020   ADDENDUM REPORT: 07/26/2020 13:26 ADDENDUM: Pathology revealed GRADE II INVASIVE DUCTAL CARCINOMA, DUCTAL CARCINOMA IN SITU of the Right breast, upper outer. This was found to be concordant by Dr. Dorise Bullion. Pathology revealed INVASIVE CARCINOMA of the Right axillary lymph node. There is invasive carcinoma with no residual lymph node tissue present. This was found to be concordant by Dr. Dorise Bullion. Pathology results were discussed with the patient by telephone. The patient reported doing well after the biopsies with tenderness at the sites. Post biopsy  instructions and care were reviewed and questions were answered. The patient was encouraged to call [facility] for any additional concerns. My direct phone number was provided. The patient was referred to [facility] on August 04, 2020. Recommendation for a bilateral breast MRI for extent to exclude any additional sites of disease. NOTE: It is important to note the patient was also scheduled for stereotactic biopsy of calcifications extending posterolateral to the Right breast mass. On the diagnostic mammogram, there are clearly very subtle calcifications extending posterolateral to the Right breast mass. However, these calcifications are not well seen on non magnified views. With imaging obtained for the stereotactic biopsy on November 5, there were no definitive groups of calcifications on imaging to correlate with the magnified views. While there are clearly calcifications in this region on magnified views, they are not reliably seen on non magnified views. As a result, the stereotactic  biopsy was canceled on July 23, 2020. If the patient wishes to pursue breast conservation, recommend a breast MRI. If the MRI shows a clear target to demonstrate extent of disease, recommend MRI guided biopsy. If the MRI does not show a clear target to demonstrate extent of disease, we should re-attempt the stereotactic biopsy. If the patient pursues a mastectomy, no additional biopsies are necessary. Pathology results reported by Terie Purser, RN on 07/26/2020. Electronically Signed   By: Dorise Bullion III M.D   On: 07/26/2020 13:26   Result Date: 07/26/2020 CLINICAL DATA:  Biopsy of a right breast mass and a right axillary lymph node EXAM: ULTRASOUND GUIDED RIGHT BREAST CORE NEEDLE BIOPSIES COMPARISON:  Previous exam(s). PROCEDURE: I met with the patient and we discussed the procedure of ultrasound-guided biopsy, including benefits and alternatives. We discussed the high likelihood of a successful procedure. We discussed the risks of the procedure, including infection, bleeding, tissue injury, clip migration, and inadequate sampling. Informed written consent was given. The usual time-out protocol was performed immediately prior to the procedure. Lesion quadrant: Upper outer quadrant Using sterile technique and 1% Lidocaine as local anesthetic, under direct ultrasound visualization, a 12 gauge spring-loaded device was used to perform biopsy of a mass in the upper outer quadrant of the right breast using a lateral approach. At the conclusion of the procedure a ribbon shaped tissue marker clip was deployed into the biopsy cavity. Follow up 2 view mammogram was performed and dictated separately. Lesion quadrant: Right axilla Using sterile technique and 1% Lidocaine as local anesthetic, under direct ultrasound visualization, a 14 gauge spring-loaded device was used to perform biopsy of a right axillary lymph node using a lateral approach. At the conclusion of the procedure a heart shaped tissue marker clip was  deployed into the biopsy cavity. Follow up 2 view mammogram was performed and dictated separately. IMPRESSION: Ultrasound guided biopsy of a right breast mass and a right axillary lymph node. No apparent complications. It is important to note the patient was also scheduled for stereotactic biopsy of calcifications extending posterolateral to the right breast mass. On the diagnostic mammogram, there are clearly very subtle calcifications extending posterolateral to the right breast mass. However, these calcifications are not well seen on non magnified views. With imaging obtained for the stereotactic biopsy today, there were no definitive groups of calcifications on today's imaging to correlate with the magnified views. While there are clearly calcifications in this region on magnified views, they are not reliably seen on non magnified  views. As a result, the stereotactic biopsy was canceled. If today's of biopsies do demonstrate malignancy and the patient wishes to pursue breast conservation, recommend a breast MRI. If the MRI shows a clear target to demonstrate extent of disease, recommend MRI guided biopsy. If the MRI does not show a clear target to demonstrate extent of disease, we should re-attempt the stereotactic biopsy. If today's biopsies are malignant and the patient wants a mastectomy, no additional biopsies are necessary. Electronically Signed: By: Dorise Bullion III M.D On: 07/23/2020 09:30   MS DIGITAL DIAG TOMO BILAT  Result Date: 07/13/2020 CLINICAL DATA:  44 year old female presenting for evaluation of a palpable lump in the right breast present for 1-2 years. EXAM: DIGITAL DIAGNOSTIC BILATERAL MAMMOGRAM WITH TOMO AND CAD; ULTRASOUND RIGHT BREAST LIMITED COMPARISON:  None. The patient apparently has prior mammograms, but they are unable to be retrieved. ACR Breast Density Category b: There are scattered areas of fibroglandular density. FINDINGS: Spot compression tomosynthesis images through the  lateral middle depth of the right breast demonstrates a highly suspicious spiculated mass measuring approximately 2.5 cm. There are associated amorphous and fine punctate calcifications throughout the mass and extending into the lateral posterior right breast. All together, the mass and the calcifications span about 6.4 cm. No suspicious calcifications, masses or areas of distortion are seen in the left breast. Mammographic images were processed with CAD. Ultrasound targeted to the right breast at 11 o'clock, 4 cm from the nipple demonstrates an irregular hypoechoic vascular mass measuring 2.2 x 1.9 x 2.0 cm. Ultrasound of the right axilla demonstrates at least 2 clearly abnormal and 3 suspicious but borderline lymph nodes. IMPRESSION: 1. There is a highly suspicious 2.2 cm mass in the right breast at 11 o'clock. There are calcifications within the mass and extending posterior and lateral to the mass, which altogether spans about 6.4 cm. 2. Ultrasound of the right axilla demonstrates 2 clearly abnormal lymph nodes, and 3 additional possible abnormal lymph nodes. 3.  No evidence of left breast malignancy. RECOMMENDATION: 1. Ultrasound-guided biopsy is recommended for the right breast mass at 11 o'clock and the most abnormal appearing right axillary lymph node. 2. Stereotactic biopsy is recommended for a distant site of calcifications in the upper-outer right breast. These procedures have been scheduled for 07/23/2020 at 7:30 a.m. I have discussed the findings and recommendations with the patient. If applicable, a reminder letter will be sent to the patient regarding the next appointment. BI-RADS CATEGORY  5: Highly suggestive of malignancy. Electronically Signed   By: Ammie Ferrier M.D.   On: 07/13/2020 11:28   MM CLIP PLACEMENT RIGHT  Result Date: 07/23/2020 CLINICAL DATA:  Evaluate biopsy marker EXAM: DIAGNOSTIC RIGHT MAMMOGRAM POST ULTRASOUND BIOPSY COMPARISON:  Previous exam(s). FINDINGS: Mammographic  images were obtained following ultrasound guided biopsy of both a right breast mass and a right axillary node. The ribbon shaped clip is within the biopsied right breast mass. The heart shaped clip placed into the biopsied right axillary node is not visualized on this study. IMPRESSION: The ribbon shaped clip is within the biopsied right breast mass. The heart shaped clip placed into the biopsied right axillary node cannot be visualized on this study. Final Assessment: Post Procedure Mammograms for Marker Placement Electronically Signed   By: Dorise Bullion III M.D   On: 07/23/2020 09:20   [place] RT BREAST BX W LOC DEV 1ST LESION IMG BX SPEC US GUIDE  Addendum Date: 07/26/2020   ADDENDUM REPORT: 07/26/2020 13:26 ADDENDUM: Pathology revealed  GRADE II INVASIVE DUCTAL CARCINOMA, DUCTAL CARCINOMA IN SITU of the Right breast, upper outer. This was found to be concordant by Dr. Dorise Bullion. Pathology revealed INVASIVE CARCINOMA of the Right axillary lymph node. There is invasive carcinoma with no residual lymph node tissue present. This was found to be concordant by Dr. Dorise Bullion. Pathology results were discussed with the patient by telephone. The patient reported doing well after the biopsies with tenderness at the sites. Post biopsy instructions and care were reviewed and questions were answered. The patient was encouraged to call [facility] for any additional concerns. My direct phone number was provided. The patient was referred to [facility] on August 04, 2020. Recommendation for a bilateral breast MRI for extent to exclude any additional sites of disease. NOTE: It is important to note the patient was also scheduled for stereotactic biopsy of calcifications extending posterolateral to the Right breast mass. On the diagnostic mammogram, there are clearly very subtle calcifications extending  posterolateral to the Right breast mass. However, these calcifications are not well seen on non magnified views. With imaging obtained for the stereotactic biopsy on November 5, there were no definitive groups of calcifications on imaging to correlate with the magnified views. While there are clearly calcifications in this region on magnified views, they are not reliably seen on non magnified views. As a result, the stereotactic biopsy was canceled on July 23, 2020. If the patient wishes to pursue breast conservation, recommend a breast MRI. If the MRI shows a clear target to demonstrate extent of disease, recommend MRI guided biopsy. If the MRI does not show a clear target to demonstrate extent of disease, we should re-attempt the stereotactic biopsy. If the patient pursues a mastectomy, no additional biopsies are necessary. Pathology results reported by Terie Purser, RN on 07/26/2020. Electronically Signed   By: Dorise Bullion III M.D   On: 07/26/2020 13:26   Result Date: 07/26/2020 CLINICAL DATA:  Biopsy of a right breast mass and a right axillary lymph node EXAM: ULTRASOUND GUIDED RIGHT BREAST CORE NEEDLE BIOPSIES COMPARISON:  Previous exam(s). PROCEDURE: I met with the patient and we discussed the procedure of ultrasound-guided biopsy, including benefits and alternatives. We discussed the high likelihood of a successful procedure. We discussed the risks of the procedure, including infection, bleeding, tissue injury, clip migration, and inadequate sampling. Informed written consent was given. The usual time-out protocol was performed immediately prior to the procedure. Lesion quadrant: Upper outer quadrant Using sterile technique and 1% Lidocaine as local anesthetic, under direct ultrasound visualization, a 12 gauge spring-loaded device was used to perform biopsy of a mass in the upper outer quadrant of the right breast using a lateral approach. At the conclusion of the procedure a ribbon shaped tissue  marker clip was deployed into the biopsy cavity. Follow up 2 view mammogram was performed and dictated separately. Lesion quadrant: Right axilla Using sterile technique and 1% Lidocaine as local anesthetic, under direct ultrasound visualization, a 14 gauge spring-loaded device was used to perform biopsy of a right axillary lymph node using a lateral approach. At the conclusion of the procedure a heart shaped tissue marker clip was deployed into the biopsy cavity. Follow up 2 view mammogram was performed and dictated separately. IMPRESSION: Ultrasound guided biopsy of a right breast mass and a right axillary lymph node. No apparent complications. It is important to note the patient was also scheduled for stereotactic biopsy  of calcifications extending posterolateral to the right breast mass. On the diagnostic mammogram, there are clearly very subtle calcifications extending posterolateral to the right breast mass. However, these calcifications are not well seen on non magnified views. With imaging obtained for the stereotactic biopsy today, there were no definitive groups of calcifications on today's imaging to correlate with the magnified views. While there are clearly calcifications in this region on magnified views, they are not reliably seen on non magnified views. As a result, the stereotactic biopsy was canceled. If today's of biopsies do demonstrate malignancy and the patient wishes to pursue breast conservation, recommend a breast MRI. If the MRI shows a clear target to demonstrate extent of disease, recommend MRI guided biopsy. If the MRI does not show a clear target to demonstrate extent of disease, we should re-attempt the stereotactic biopsy. If today's biopsies are malignant and the patient wants a mastectomy, no additional biopsies are necessary. Electronically Signed: By: Dorise Bullion III M.D On: 07/23/2020 09:30      IMPRESSION: Stage T2, N1, Mx Right Breast UOQ, Invasive Ductal Carcinoma with  DCIS, ER+ / PR+ / Her2-, Grade 2  The patient may be candidate for breast conservation with radiotherapy to the right breast depending on MRI. We discussed the general course of radiation, potential side effects, and toxicities with radiation and the patient is interested in this approach.  If she is not a candidate for breast conserving surgery, we would recommend postmastectomy radiation therapy given her node positivity.   PLAN:  1. Genetics 2. MRI 3. MammaPrint from biopsy 4. Surgery TBD with TAD (targeted node dissection) 5. Adjuvant radiation therapy 6. Aromatase inhibitor   ------------------------------------------------  Blair Promise, PhD, MD  This document serves as a record of services personally performed by Gery Pray, MD. It was created on his behalf by Clerance Lav, a trained medical scribe. The creation of this record is based on the scribe's personal observations and the provider's statements to them. This document has been checked and approved by the attending provider.

## 2020-08-04 NOTE — Patient Instructions (Signed)

## 2020-08-04 NOTE — Therapy (Signed)
[place], [place], [address] Phone: [phone]   Fax:  [phone]  Physical Therapy Evaluation  Patient Details  Name: Melody Rodriguez MRN: [id number] Date of Birth: 19-Sep-1975 Referring Provider (PT): Dr. [name]   Encounter Date: 08/04/2020   PT End of Session - 08/04/20 1508    Visit Number 1    Number of Visits 2    Date for PT Re-Evaluation 02/01/21    PT Start Time 6962    PT Stop Time 1041   Also saw pt from 1105-1120 for a total of 34 minutes   PT Time Calculation (min) 19 min    Activity Tolerance Patient tolerated treatment well    Behavior During Therapy [facility] for tasks assessed/performed           Past Medical History:  Diagnosis Date  . Breast cancer ([place])   . Family history of colon cancer     Past Surgical History:  Procedure Laterality Date  . BREAST CYST EXCISION Right    Patient does not recall (2014 or 2015)  . CESAREAN SECTION    . THYROIDECTOMY, PARTIAL      There were no vitals filed for this visit.    Subjective Assessment - 08/04/20 1459    Subjective Patient reports she is here today at the cancer center to be seen by her medical team for her newly diagnosed left breast cancer. She also reports fatigue and significant bleeding from a prolonged menstral cycle.    Pertinent History Patient was diagnosed on 07/13/2020 with right grade 2 invasive ductal carcinoma breast cancer. The mass measures 2.5 cm with a total size of 6.4 cm including calcifications. It is ER/PR positive and HER2 negative with a Ki67 of 20%. She has 2 abnormal appearing lymph nodes with 1 biopsied and found to be positive for carcinoma. Iron deficiency.    Patient Stated Goals Reduce lymphedema risk and learn post op shoulder ROM HEP    Currently in Pain? Yes    Pain Score 7     Pain Location Neck    Pain Orientation Right    Pain Descriptors / Indicators Aching    Pain Type Chronic  pain    Pain Onset More than a month ago    Pain Frequency Intermittent    Aggravating Factors  stress    Pain Relieving Factors unknown              [facility] PT Assessment - 08/04/20 0001      Assessment   Medical Diagnosis Right breast cancer    Referring Provider (PT) Dr. [name]    Onset Date/Surgical Date 07/13/20    Hand Dominance Right    Prior Therapy none      Precautions   Precautions Other (comment)    Precaution Comments active cancer      Restrictions   Weight Bearing Restrictions No      Balance Screen   Has the patient fallen in the past 6 months No    Has the patient had a decrease in activity level because of a fear of falling?  No    Is the patient reluctant to leave their home because of a fear of falling?  No      Home Environment   Living Environment Private residence    Living Arrangements Children   28 y.o. son who has autism   Available Help at Discharge Family      Prior Function  Level of Independence Independent    Vocation Full time employment    Vocation Requirements [employer]    Leisure She does not exercise      Cognition   Overall Cognitive Status Within Functional Limits for tasks assessed      Posture/Postural Control   Posture/Postural Control Postural limitations    Postural Limitations Rounded Shoulders;Forward head      ROM / Strength   AROM / PROM / Strength AROM;Strength      AROM   Overall AROM Comments Cervical extension limited 25%all other cervical AROM is WNL    AROM Assessment Site Shoulder    Right/Left Shoulder Right;Left    Right Shoulder Extension 37 Degrees    Right Shoulder Flexion 130 Degrees    Right Shoulder ABduction 144 Degrees    Right Shoulder Internal Rotation 53 Degrees    Right Shoulder External Rotation 82 Degrees    Left Shoulder Extension 46 Degrees    Left Shoulder Flexion 123 Degrees    Left Shoulder ABduction 150 Degrees    Left Shoulder Internal Rotation 65 Degrees     Left Shoulder External Rotation 82 Degrees      Strength   Overall Strength Within functional limits for tasks performed             LYMPHEDEMA/ONCOLOGY QUESTIONNAIRE - 08/04/20 0001      Type   Cancer Type Right breast cancer      Lymphedema Assessments   Lymphedema Assessments Upper extremities      Right Upper Extremity Lymphedema   10 cm Proximal to Olecranon Process 33 cm    Olecranon Process 26.5 cm    10 cm Proximal to Ulnar Styloid Process 25 cm    Just Proximal to Ulnar Styloid Process 16.8 cm    Across Hand at [employer] 20.3 cm    At [place] of 2nd Digit 6.7 cm      Left Upper Extremity Lymphedema   10 cm Proximal to Olecranon Process 33.2 cm    Olecranon Process 26.5 cm    10 cm Proximal to Ulnar Styloid Process 23.7 cm    Just Proximal to Ulnar Styloid Process 17.2 cm    Across Hand at [employer] 20.8 cm    At [place] of 2nd Digit 6.4 cm           L-DEX FLOWSHEETS - 08/04/20 1500      L-DEX LYMPHEDEMA SCREENING   Measurement Type Unilateral    L-DEX MEASUREMENT EXTREMITY Upper Extremity    POSITION  Standing    DOMINANT SIDE Right    At Risk Side Right    BASELINE SCORE (UNILATERAL) 0.4           The patient was assessed using the L-Dex machine today to produce a lymphedema index baseline score. The patient will be reassessed on a regular basis (typically every 3 months) to obtain new L-Dex scores. If the score is > 6.5 points away from his/her baseline score indicating onset of subclinical lymphedema, it will be recommended to wear a compression garment for 4 weeks, 12 hours per day and then be reassessed. If the score continues to be > 6.5 points from baseline at reassessment, we will initiate lymphedema treatment. Assessing in this manner has a 95% rate of preventing clinically significant lymphedema.      Katina Dung - 08/04/20 0001    Open a tight or new jar Moderate difficulty    Do heavy household chores (  wash walls, wash floors) Mild  difficulty    Carry a shopping bag or briefcase Moderate difficulty    Wash your back Severe difficulty    Use a knife to cut food Moderate difficulty    Recreational activities in which you take some force or impact through your arm, shoulder, or hand (golf, hammering, tennis) Severe difficulty    During the past week, to what extent has your arm, shoulder or hand problem interfered with your normal social activities with family, friends, neighbors, or groups? Quite a bit    During the past week, to what extent has your arm, shoulder or hand problem limited your work or other regular daily activities Slightly    Arm, shoulder, or hand pain. Moderate    Tingling (pins and needles) in your arm, shoulder, or hand Moderate    Difficulty Sleeping Moderate difficulty    DASH Score 52.27 %            Objective measurements completed on examination: See above findings.       Patient was instructed today in a home exercise program today for post op shoulder range of motion. These included active assist shoulder flexion in sitting, scapular retraction, wall walking with shoulder abduction, and hands behind head external rotation.  She was encouraged to do these twice a day, holding 3 seconds and repeating 5 times when permitted by her physician.            PT Education - 08/04/20 1507    Education Details Lymphedema risk reduction and post op shoulder ROM HEP    Person(s) Educated Patient    Methods Explanation;Demonstration;Handout    Comprehension Returned demonstration;Verbalized understanding               PT Long Term Goals - 08/04/20 1515      PT LONG TERM GOAL #1   Title Patient will demonstrate she has regained full shoulder ROM and function post operatively compared to baselines.    Time 6    Period Months    Status New    Target Date 02/01/21           Breast Clinic Goals - 08/04/20 1515      Patient will be able to verbalize understanding of pertinent  lymphedema risk reduction practices relevant to her diagnosis specifically related to skin care.   Time 1    Period Days    Status Achieved      Patient will be able to return demonstrate and/or verbalize understanding of the post-op home exercise program related to regaining shoulder range of motion.   Time 1    Period Days    Status Achieved      Patient will be able to verbalize understanding of the importance of attending the postoperative After Breast Cancer Class for further lymphedema risk reduction education and therapeutic exercise.   Time 1    Period Days    Status Achieved                 Plan - 08/04/20 1509    Clinical Impression Statement Patient was diagnosed on 07/13/2020 with right grade 2 invasive ductal carcinoma breast cancer. The mass measures 2.5 cm with a total size of 6.4 cm including calcifications. It is ER/PR positive and HER2 negative with a Ki67 of 20%. She has 2 abnormal appearing lymph nodes with 1 biopsied and found to be positive for carcinoma. Iron deficiency. Her multidisciplinary medical team met prior to her assessments  to determine a recommended treatment plan. She is planning to have either neoadjuvant chemotherapy or a right mastectomy with a targeted axillary lymph node dissection as her first step. This will be followed by radiation and anti-estrogen therapy. She will benefit from a post op PT reassessment to determine needs and from L-Dex screens every 3 months for 2 years to detect subclinical lymphedema.    Stability/Clinical Decision Making Stable/Uncomplicated    Clinical Decision Making Low    Rehab Potential Excellent    PT Frequency --   Eval and 1 f/u visit   PT Treatment/Interventions ADLs/Self Care Home Management;Therapeutic exercise;Patient/family education    PT Next Visit Plan Will reassess 3-4 weeks post op to determine needs    PT Home Exercise Plan Post op shoulder ROM HEP    Consulted and Agree with Plan of Care Patient            Patient will benefit from skilled therapeutic intervention in order to improve the following deficits and impairments:  Postural dysfunction, Decreased range of motion, Pain, Impaired UE functional use, Decreased knowledge of precautions  Visit Diagnosis: Malignant neoplasm of upper-outer quadrant of right breast in female, estrogen receptor positive ([place]) - Plan: PT plan of care cert/re-cert  Abnormal posture - Plan: PT plan of care cert/re-cert   Patient will follow up at outpatient cancer rehab 3-4 weeks following surgery.  If the patient requires physical therapy at that time, a specific plan will be dictated and sent to the referring physician for approval. The patient was educated today on appropriate basic range of motion exercises to begin post operatively and the importance of attending the After Breast Cancer class following surgery.  Patient was educated today on lymphedema risk reduction practices as it pertains to recommendations that will benefit the patient immediately following surgery.  She verbalized good understanding.     Problem List Patient Active Problem List   Diagnosis Date Noted  . Iron deficiency anemia due to chronic blood loss 08/04/2020  . Family history of colon cancer   . Malignant neoplasm of upper-outer quadrant of right breast in female, estrogen receptor positive ([place]) 07/28/2020   Annia Friendly, PT 08/04/20 3:18 PM  [place], [place], [address] Phone: [phone]   Fax:  [phone]  Name: Janyla Biscoe MRN: [id number] Date of Birth: 10-09-1975

## 2020-08-04 NOTE — Assessment & Plan Note (Signed)
07/28/2020:Patient palpated a right breast mass for 1-2 years. Mammogram showed a 2.2cm mass at the 11 o'clock position with surrounding calcifications, 6.4cm in total extent, and up to 5 abnormal right axillary lymph nodes. 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   Pathology and radiology counseling: Discussed with the patient, the details of pathology including the type of breast cancer,the clinical staging, the significance of ER, PR and HER-2/neu receptors and the implications for treatment. After reviewing the pathology in detail, we proceeded to discuss the different treatment options between surgery, radiation, chemotherapy, antiestrogen therapies.  Treatment plan: 1.  Neoadjuvant therapy (neoadjuvant antiestrogen therapy versus neoadjuvant chemotherapy based upon MammaPrint test results) 2. mastectomy versus breast conserving surgery with targeted node dissection 3.  Adjuvant radiation therapy 4.  Follow-up adjuvant antiestrogen therapy  Tamoxifen counseling:We discussed the risks and benefits of tamoxifen. These include but not limited to insomnia, hot flashes, mood changes, vaginal dryness, and weight gain. Although rare, serious side effects including endometrial cancer, risk of blood clots were also discussed. We strongly believe that the benefits far outweigh the risks. Patient understands these risks and consented to starting treatment.    Heavy menses will refer her to gynecology. Iron deficiency anemia: I recommend administering IV iron therapy. We will obtain iron studies and ferritin.

## 2020-08-05 ENCOUNTER — Telehealth: Payer: Self-pay | Admitting: *Deleted

## 2020-08-05 ENCOUNTER — Telehealth: Payer: Self-pay | Admitting: Hematology and Oncology

## 2020-08-05 ENCOUNTER — Other Ambulatory Visit: Payer: Self-pay | Admitting: Pharmacist

## 2020-08-05 ENCOUNTER — Encounter: Payer: Self-pay | Admitting: Hematology and Oncology

## 2020-08-05 DIAGNOSIS — C50411 Malignant neoplasm of upper-outer quadrant of right female breast: Secondary | ICD-10-CM

## 2020-08-05 DIAGNOSIS — Z170 Estrogen receptor positive status [ER+]: Secondary | ICD-10-CM

## 2020-08-05 LAB — VON WILLEBRAND PANEL
Coagulation Factor VIII: 164 % — ABNORMAL HIGH (ref 56–140)
Ristocetin Co-factor, Plasma: 96 % (ref 50–200)
Von Willebrand Antigen, Plasma: 120 % (ref 50–200)

## 2020-08-05 LAB — COAG STUDIES INTERP REPORT

## 2020-08-05 MED ORDER — TAMOXIFEN CITRATE 20 MG PO TABS: 20.0000 mg | ORAL_TABLET | Freq: Every day | ORAL | 3 refills | Status: DC

## 2020-08-05 MED FILL — TAMOXIFEN 20 MG TABLET: 20 | 30 days supply | Qty: 30 | Fill #0

## 2020-08-05 NOTE — Telephone Encounter (Signed)
Spoke to pt concerning [place] from 11.17.21. Denies questions or concerns regarding dx or treatment care plan.   Pt did report having cough and congestion since "weather change" with out relief with OTC. Worse at night. Non-productive cough. Afebrile. Dr. Lindi Adie notified

## 2020-08-05 NOTE — Telephone Encounter (Signed)
Received order for mammaprint testing on core bx. Requisition faxed to pathology and [facility].

## 2020-08-05 NOTE — Telephone Encounter (Signed)
Scheduled appts per 11/17 los. Pt confirmed appt dates and time.

## 2020-08-05 NOTE — Progress Notes (Signed)
Met with patient at registration area to obtain income documents for [employer] grant to assist with Tamoxifen cost.  Patient approved for one-time $1000 [employer] and qualifications to assist with medication and other personal expenses. Went over expense sheet in detail. She received a gift card today from her grant. She has a copy of the approval letter and expense sheet along with the Outpatient pharmacy information.  She has heading over to [place] to pick up prescription.  She has my card for any additional financial questions or concerns.

## 2020-08-05 NOTE — Progress Notes (Signed)
Oral Oncology Pharmacist Encounter  Prescription for Tamoxifen redirected to [facility] per patient request given that cost will be more affordable for patient.   Leron Croak, PharmD, BCPS Hematology/Oncology Clinical Pharmacist [facility] [phone] 08/05/2020 9:17 AM

## 2020-08-06 ENCOUNTER — Other Ambulatory Visit: Payer: Self-pay | Admitting: *Deleted

## 2020-08-06 MED ORDER — AZITHROMYCIN 250 MG PO TABS: ORAL_TABLET | ORAL | 0 refills | Status: DC

## 2020-08-06 MED FILL — AZITHROMYCIN 250 MG TABLET: 250 | 5 days supply | Qty: 6 | Fill #0

## 2020-08-06 NOTE — Progress Notes (Signed)
Pt spoke with nurse navigator [place] yesterday regarding sinus infection.  Per MD pt to start Z-pack and follow up if symptoms do not resolve.  Pt notified and verbalized understanding.

## 2020-08-09 ENCOUNTER — Encounter: Payer: Self-pay | Admitting: Hematology and Oncology

## 2020-08-09 NOTE — Progress Notes (Signed)
Called patient whom emailed concerns regarding bills and treatment.   Spoke words of encouragement and advised all uninsured patients receive an automatic 56% discount for services billed through [facility] and that she may apply for additional discount by completing the financial assistance application if her Medicaid application is denied.  Spent quite a bit of time listening to concerns and helping patient identify what she has accomplished thus far. She was very Patent attorney.  She has my card for any additional financial questions or concerns.

## 2020-08-11 ENCOUNTER — Encounter: Payer: Self-pay | Admitting: Genetic Counselor

## 2020-08-11 ENCOUNTER — Telehealth: Payer: Self-pay | Admitting: Genetic Counselor

## 2020-08-11 DIAGNOSIS — Z1379 Encounter for other screening for genetic and chromosomal anomalies: Secondary | ICD-10-CM | POA: Insufficient documentation

## 2020-08-11 NOTE — Telephone Encounter (Signed)
Revealed negative genetic testing for [facility] Breast Cancer STAT Panel (9 genes).  Discussed that we do not know why she has breast cancer. It could be sporadic, due to a different gene that we are not testing, or maybe our current technology may not be able to pick something up.  It will be important for her to keep in contact with genetics to keep up with whether additional testing may be needed.  Common Hereditary Cancers Panel (48 genes) is pending, and Melody Rodriguez will be contacted when results are available.

## 2020-08-13 ENCOUNTER — Encounter: Payer: Self-pay | Admitting: Hematology and Oncology

## 2020-08-14 ENCOUNTER — Inpatient Hospital Stay: Payer: Medicaid Other

## 2020-08-14 VITALS — BP 121/61 | HR 70 | Temp 97.6°F | Resp 18

## 2020-08-14 DIAGNOSIS — C50411 Malignant neoplasm of upper-outer quadrant of right female breast: Secondary | ICD-10-CM | POA: Diagnosis not present

## 2020-08-14 DIAGNOSIS — D500 Iron deficiency anemia secondary to blood loss (chronic): Secondary | ICD-10-CM

## 2020-08-14 MED ORDER — SODIUM CHLORIDE 0.9 % IV SOLN
Freq: Once | INTRAVENOUS | Status: AC
  Filled 2020-08-14: qty 250

## 2020-08-14 MED ORDER — SODIUM CHLORIDE 0.9 % IV SOLN
300.0000 mg | Freq: Once | INTRAVENOUS | Status: AC
  Administered 2020-08-14: 300 mg via INTRAVENOUS
  Filled 2020-08-14: qty 10

## 2020-08-14 NOTE — Patient Instructions (Signed)

## 2020-08-16 ENCOUNTER — Other Ambulatory Visit: Payer: Self-pay

## 2020-08-16 ENCOUNTER — Telehealth: Payer: Self-pay | Admitting: Emergency Medicine

## 2020-08-16 ENCOUNTER — Other Ambulatory Visit: Payer: Self-pay | Admitting: Hematology and Oncology

## 2020-08-16 ENCOUNTER — Ambulatory Visit (HOSPITAL_COMMUNITY)
Admission: RE | Admit: 2020-08-16 | Discharge: 2020-08-16 | Disposition: A | Discharge status: Home / Self Care | Payer: Medicaid Other | Source: Ambulatory Visit | Attending: Surgery | Admitting: Surgery

## 2020-08-16 ENCOUNTER — Encounter: Payer: Self-pay | Admitting: *Deleted

## 2020-08-16 ENCOUNTER — Telehealth: Payer: Self-pay | Admitting: *Deleted

## 2020-08-16 DIAGNOSIS — Z170 Estrogen receptor positive status [ER+]: Secondary | ICD-10-CM

## 2020-08-16 DIAGNOSIS — M545 Low back pain, unspecified: Secondary | ICD-10-CM

## 2020-08-16 DIAGNOSIS — C50411 Malignant neoplasm of upper-outer quadrant of right female breast: Secondary | ICD-10-CM | POA: Diagnosis present

## 2020-08-16 DIAGNOSIS — R3589 Other polyuria: Secondary | ICD-10-CM

## 2020-08-16 MED ORDER — CYCLOBENZAPRINE HCL 5 MG PO TABS: 5.0000 mg | ORAL_TABLET | Freq: Three times a day (TID) | ORAL | 0 refills | Status: DC | PRN

## 2020-08-16 MED ORDER — GADOBUTROL 1 MMOL/ML IV SOLN
10.0000 mL | Freq: Once | INTRAVENOUS | Status: AC | PRN
  Administered 2020-08-16: 9 mL via INTRAVENOUS

## 2020-08-16 NOTE — Telephone Encounter (Signed)
Pt called to report muscle spasms on lower right/left back that started after she received IV iron infusion on Saturday (1st time).  Pt states she has a hx of back spasms but never in her lower back/this severe.  Pt reports polyuria but denies dysuria or hematuria.  Pt denies any other symptoms.  Pt advised to come to [facility] for urine sample tomorrow morning at 8 am, agreed to plan.  Orders placed and high priority scheduling message sent.  Pt will be contacted by [facility] regarding results.  Pt advised per PA Lucianne Lei that iron is not likely to cause back spasms but that she can go to Urgent care if symptoms worsen/change as needed or call [facility] back for more advisement.  Pt states she received medication for muscle spasms a while back from [name] and is requesting a refill.  MD Gudena's office made aware.

## 2020-08-16 NOTE — Telephone Encounter (Signed)
Received mammaprint result of HIGH RISK on CORE bx. Physician team notified. Called pt with results, scheduled and confirmed appt with Dr. Lindi Adie on 08/19/20 at 1200.  Discussed MRI results as well and need for MRI bx of NME. Received verbal understanding.

## 2020-08-17 ENCOUNTER — Inpatient Hospital Stay: Payer: Medicaid Other

## 2020-08-17 ENCOUNTER — Other Ambulatory Visit: Payer: Self-pay | Admitting: Hematology and Oncology

## 2020-08-17 ENCOUNTER — Other Ambulatory Visit: Payer: Self-pay | Admitting: *Deleted

## 2020-08-17 ENCOUNTER — Other Ambulatory Visit: Payer: Self-pay

## 2020-08-17 ENCOUNTER — Telehealth: Payer: Self-pay | Admitting: Emergency Medicine

## 2020-08-17 ENCOUNTER — Encounter: Payer: Self-pay | Admitting: *Deleted

## 2020-08-17 DIAGNOSIS — C50411 Malignant neoplasm of upper-outer quadrant of right female breast: Secondary | ICD-10-CM

## 2020-08-17 DIAGNOSIS — R3589 Other polyuria: Secondary | ICD-10-CM

## 2020-08-17 DIAGNOSIS — M545 Low back pain, unspecified: Secondary | ICD-10-CM

## 2020-08-17 DIAGNOSIS — Z170 Estrogen receptor positive status [ER+]: Secondary | ICD-10-CM

## 2020-08-17 LAB — URINALYSIS, COMPLETE (UACMP) WITH MICROSCOPIC
Bilirubin Urine: NEGATIVE
Glucose, UA: NEGATIVE mg/dL
Hgb urine dipstick: NEGATIVE
Ketones, ur: 5 mg/dL — AB
Leukocytes,Ua: NEGATIVE
Nitrite: NEGATIVE
Protein, ur: NEGATIVE mg/dL
Specific Gravity, Urine: 1.023 (ref 1.005–1.030)
pH: 5 (ref 5.0–8.0)

## 2020-08-17 MED ORDER — CYCLOBENZAPRINE HCL 5 MG PO TABS
5.0000 mg | ORAL_TABLET | Freq: Three times a day (TID) | ORAL | 0 refills | Status: DC | PRN
Start: 2020-08-17 — End: 2020-08-17

## 2020-08-17 MED FILL — CYCLOBENZAPRINE HCL 5 MG TA: 5 | 6 days supply | Qty: 20 | Fill #0

## 2020-08-17 NOTE — Telephone Encounter (Signed)
Called pt to report negative urinalysis results.  Pt states she forgot to let RN Navigator Dawn S know that her muscle relaxer refill needs to go to [facility] instead of [facility].  RN will make her aware.  Pt denies any further questions or concerns at this time.

## 2020-08-18 LAB — URINE CULTURE: Culture: NO GROWTH

## 2020-08-18 NOTE — Progress Notes (Signed)
..  The following Medication: Venofer is approved for drug replacement program by [employer]. The enrollment period is from 08/17/2020 to 11/15/2020.  Reason for Assistance: Self Pay/Medicaid Family Planning. ID: [id number] First DOS:08/14/2020.

## 2020-08-18 NOTE — Assessment & Plan Note (Signed)
07/28/2020:Patient palpated a right breast mass for 1-2 years. Mammogram showed a 2.2cm mass at the 11 o'clock position with surrounding calcifications, 6.4cm in total extent, and up to 5 abnormal right axillary lymph nodes. 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   Pathology and radiology counseling: Discussed with the patient, the details of pathology including the type of breast cancer,the clinical staging, the significance of ER, PR and HER-2/neu receptors and the implications for treatment. After reviewing the pathology in detail, we proceeded to discuss the different treatment options between surgery, radiation, chemotherapy, antiestrogen therapies.  Treatment plan: 1.  Neoadjuvant chemotherapy (MammaPrint test High Risk): AC-Pembro foll by Taxol-Pembro 2. mastectomy versus breast conserving surgery with targeted node dissection 3.  Adjuvant radiation therapy 4.  Follow-up adjuvant antiestrogen therapy ------------------------------------------------------------------------------------------------------------------------------- Chemotherapy Counseling: I discussed the risks and benefits of chemotherapy including the risks of nausea/ vomiting, risk of infection from low WBC count, fatigue due to chemo or anemia, bruising or bleeding due to low platelets, mouth sores, loss/ change in taste and decreased appetite. Liver and kidney function will be monitored through out chemotherapy as abnormalities in liver and kidney function may be a side effect of treatment. Cardiac dysfunction due to Adriamycin  were discussed in detail. Risk of permanent bone marrow dysfunction and leukemia due to chemo were also discussed.Immunotherapy AE was also discussed.  Port, chemo class, ECHO [facility] 16070: Treatment of refractory nausea.  After first cycle of chemo if patient  experience chemo induced nausea and vomiting the randomized from cycle 2 to Aloxi plus Dex plus olanzapine or placebo plus Compazine or placebo plus placebo prior to chemo and take home medications for day 2 today for of Dex plus olanzapine or placebo and Compazine or placebo every 8 hours.  If patient does not have nausea after cycle 1, then the trial is complete.  RTC in 2 weeks to start chemo

## 2020-08-18 NOTE — Progress Notes (Signed)
Patient Care Team: Patient, [name] as PCP - General (General Practice) Rockwell Germany, RN as Oncology Nurse Navigator Mauro Kaufmann, RN as Oncology Nurse Navigator [name], MD as Consulting Physician (General Surgery) Nicholas Lose, MD as Consulting Physician (Hematology and Oncology) Gery Pray, MD as Consulting Physician (Radiation Oncology)  DIAGNOSIS:    ICD-10-CM   1. Malignant neoplasm of upper-outer quadrant of right breast in female, estrogen receptor positive ([place])  C50.411    Z17.0     SUMMARY OF ONCOLOGIC HISTORY: Oncology History  Malignant neoplasm of upper-outer quadrant of right breast in female, estrogen receptor positive ([place])  07/28/2020 Initial Diagnosis   Patient palpated a right breast mass for 1-2 years. Mammogram showed a 2.2cm mass at the 11 o'clock position with surrounding calcifications, 6.4cm in total extent, and up to 5 abnormal right axillary lymph nodes. 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    08/05/2020 Miscellaneous   MammaPrint: High risk luminal type B   08/11/2020 Genetic Testing   Negative genetic testing: no pathogenic variants detected in [facility] Breast Cancer STAT Panel.  The report date is August 11, 2020.    The STAT Breast cancer panel offered by [facility] includes sequencing and rearrangement analysis for the following 9 genes:  ATM, BRCA1, BRCA2, CDH1, CHEK2, PALB2, PTEN, STK11 and TP53.    Results of Common Hereditary Cancers Panel (48 genes) are pending.      CHIEF COMPLIANT: Follow-up to discuss Mammaprint results  INTERVAL HISTORY: Melody Rodriguez is a 44 y.o. with above-mentioned history of right breast cancer. Mammaprint testing showed she is high risk. She presents to the clinic today to discuss the Mammaprint result and further treatment.  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  ALLERGIES:  is allergic to latex.  MEDICATIONS:  Current Outpatient Medications  Medication Sig Dispense Refill  . azithromycin (ZITHROMAX Z-PAK) 250 MG tablet Take as directed 6 each 0  . cyclobenzaprine (FLEXERIL) 5 MG tablet Take 1 tablet (5 mg total) by mouth 3 (three) times daily as needed for muscle spasms. 20 tablet 0  . tamoxifen (NOLVADEX) 20 MG tablet Take 1 tablet (20 mg total) by mouth daily. 90 tablet 3   No current facility-administered medications for this visit.    PHYSICAL EXAMINATION: ECOG PERFORMANCE STATUS: 1 - Symptomatic but completely ambulatory  Vitals:   08/19/20 1228  BP: 122/65  Pulse: 72  Resp: (!) 0  Temp: 99.4 F (37.4 C)  SpO2: 99%   Filed Weights   08/19/20 1228  Weight: 199 lb 9.6 oz (90.5 kg)    LABORATORY DATA:  I have reviewed the data as listed CMP Latest Ref Rng & Units 08/04/2020 01/12/2020  Glucose 70 - 99 mg/dL 191(H) 150(H)  BUN 6 - 20 mg/dL 10 7  Creatinine 0.44 - 1.00 mg/dL 0.77 0.67  Sodium 135 - 145 mmol/L 141 140  Potassium 3.5 - 5.1 mmol/L 3.4(L) 3.7  Chloride 98 - 111 mmol/L 108 108  CO2 22 - 32 mmol/L 24 25  Calcium 8.9 - 10.3 mg/dL 8.6(L) 8.6(L)  Total Protein 6.5 - 8.1 g/dL 6.9 7.0  Total Bilirubin 0.3 - 1.2 mg/dL 0.3 0.7  Alkaline Phos 38 - 126 U/L 59 58  AST 15 - 41  U/L 13(L) 13(L)  ALT 0 - 44 U/L 10 12    Lab Results  Component Value Date   WBC 7.0 08/04/2020   HGB 9.6 (L) 08/04/2020   HCT 31.6 (L) 08/04/2020   MCV 77.3 (L) 08/04/2020   PLT 290 08/04/2020   NEUTROABS 4.5 08/04/2020    ASSESSMENT & PLAN:  Malignant neoplasm of upper-outer quadrant of right breast in female, estrogen receptor positive ([place]) 07/28/2020:Patient palpated a right breast mass for 1-2 years. Mammogram showed a 2.2cm mass at the 11 o'clock position with surrounding calcifications, 6.4cm in total extent, and up to  5 abnormal right axillary lymph nodes. 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   Pathology and radiology counseling: Discussed with the patient, the details of pathology including the type of breast cancer,the clinical staging, the significance of ER, PR and HER-2/neu receptors and the implications for treatment. After reviewing the pathology in detail, we proceeded to discuss the different treatment options between surgery, radiation, chemotherapy, antiestrogen therapies.  Treatment plan: 1.  Neoadjuvant chemotherapy (MammaPrint test High Risk): AC  foll by Taxol  2. mastectomy versus breast conserving surgery with targeted node dissection 3.  Adjuvant radiation therapy 4.  Follow-up adjuvant antiestrogen therapy ------------------------------------------------------------------------------------------------------------------------------- Chemotherapy Counseling: I discussed the risks and benefits of chemotherapy including the risks of nausea/ vomiting, risk of infection from low WBC count, fatigue due to chemo or anemia, bruising or bleeding due to low platelets, mouth sores, loss/ change in taste and decreased appetite. Liver and kidney function will be monitored through out chemotherapy as abnormalities in liver and kidney function may be a side effect of treatment. Cardiac dysfunction due to Adriamycin  were discussed in detail. Risk of permanent bone marrow dysfunction and leukemia due to chemo were also discussed.   Port, chemo class, ECHO [facility] 16070: Treatment of refractory nausea.  After first cycle of chemo if patient experience chemo induced nausea and vomiting the randomized from cycle 2 to Aloxi plus Dex plus olanzapine or placebo plus Compazine or placebo plus placebo prior to chemo and take home medications for day 2 today for of Dex plus olanzapine  or placebo and Compazine or placebo every 8 hours.  If patient does not have nausea after cycle 1, then the trial is complete.  Patient plans to undergo additional biopsies. RTC in 2 weeks to start chemo  No orders of the defined types were placed in this encounter.  The patient has a good understanding of the overall plan. she agrees with it. she will call with any problems that may develop before the next visit here.  Total time spent: 45 mins including face to face time and time spent for planning, charting and coordination of care  Nicholas Lose, MD 08/19/2020  I, Cloyde Reams Dorshimer, am acting as scribe for Dr. Nicholas Lose.  I have reviewed the above documentation for accuracy and completeness, and I agree with the above.

## 2020-08-19 ENCOUNTER — Encounter: Payer: Self-pay | Admitting: Emergency Medicine

## 2020-08-19 ENCOUNTER — Other Ambulatory Visit: Payer: Self-pay

## 2020-08-19 ENCOUNTER — Encounter: Payer: Self-pay | Admitting: Licensed Clinical Social Worker

## 2020-08-19 ENCOUNTER — Other Ambulatory Visit: Payer: Self-pay | Admitting: Hematology and Oncology

## 2020-08-19 ENCOUNTER — Inpatient Hospital Stay: Payer: Medicaid Other | Attending: Hematology and Oncology | Admitting: Hematology and Oncology

## 2020-08-19 VITALS — BP 122/65 | HR 72 | Temp 99.4°F | Resp 0 | Ht 65.0 in | Wt 199.6 lb

## 2020-08-19 DIAGNOSIS — Z79899 Other long term (current) drug therapy: Secondary | ICD-10-CM | POA: Insufficient documentation

## 2020-08-19 DIAGNOSIS — Z8249 Family history of ischemic heart disease and other diseases of the circulatory system: Secondary | ICD-10-CM | POA: Diagnosis not present

## 2020-08-19 DIAGNOSIS — C50411 Malignant neoplasm of upper-outer quadrant of right female breast: Secondary | ICD-10-CM | POA: Diagnosis not present

## 2020-08-19 DIAGNOSIS — Z5111 Encounter for antineoplastic chemotherapy: Secondary | ICD-10-CM | POA: Diagnosis present

## 2020-08-19 DIAGNOSIS — Z5189 Encounter for other specified aftercare: Secondary | ICD-10-CM | POA: Insufficient documentation

## 2020-08-19 DIAGNOSIS — Z170 Estrogen receptor positive status [ER+]: Secondary | ICD-10-CM | POA: Diagnosis not present

## 2020-08-19 DIAGNOSIS — Z79810 Long term (current) use of selective estrogen receptor modulators (SERMs): Secondary | ICD-10-CM | POA: Insufficient documentation

## 2020-08-19 DIAGNOSIS — Z800 Family history of malignant neoplasm of digestive organs: Secondary | ICD-10-CM | POA: Diagnosis not present

## 2020-08-19 DIAGNOSIS — F17210 Nicotine dependence, cigarettes, uncomplicated: Secondary | ICD-10-CM | POA: Insufficient documentation

## 2020-08-19 DIAGNOSIS — D508 Other iron deficiency anemias: Secondary | ICD-10-CM | POA: Diagnosis present

## 2020-08-19 DIAGNOSIS — Z7952 Long term (current) use of systemic steroids: Secondary | ICD-10-CM | POA: Diagnosis not present

## 2020-08-19 DIAGNOSIS — Z833 Family history of diabetes mellitus: Secondary | ICD-10-CM | POA: Insufficient documentation

## 2020-08-19 DIAGNOSIS — C773 Secondary and unspecified malignant neoplasm of axilla and upper limb lymph nodes: Secondary | ICD-10-CM | POA: Diagnosis not present

## 2020-08-19 MED ORDER — ONDANSETRON HCL 8 MG PO TABS: 8.0000 mg | ORAL_TABLET | Freq: Two times a day (BID) | ORAL | 1 refills | Status: DC | PRN

## 2020-08-19 MED ORDER — LIDOCAINE-PRILOCAINE 2.5-2.5 % EX CREA: TOPICAL_CREAM | CUTANEOUS | 3 refills | Status: DC

## 2020-08-19 MED ORDER — PROCHLORPERAZINE MALEATE 10 MG PO TABS: 10.0000 mg | ORAL_TABLET | Freq: Four times a day (QID) | ORAL | 1 refills | Status: DC | PRN

## 2020-08-19 MED ORDER — DEXAMETHASONE 4 MG PO TABS: 4.0000 mg | ORAL_TABLET | Freq: Every day | ORAL | 0 refills | Status: DC

## 2020-08-19 MED FILL — DEXAMETHASONE 4 MG TABLET: 4 | 8 days supply | Qty: 8 | Fill #0

## 2020-08-19 MED FILL — ONDANSETRON HCL 8 MG TABLET: 8 | 15 days supply | Qty: 30 | Fill #0

## 2020-08-19 MED FILL — PROCHLORPERAZINE 10 MG TAB: 10 | 7 days supply | Qty: 30 | Fill #0

## 2020-08-19 NOTE — Progress Notes (Signed)
[facility] CSW Progress Note  [facility] representative met with patient to follow up on resources. Patient brought in [employer] application and [employer] forms.  CSW submitted [employer] by fax. [facility] missing one document which patient will give to CSW before submission.  CSW signed patient up for [employer] and give first installment today.     Christeen Douglas , LCSW

## 2020-08-19 NOTE — Research (Signed)
[id number] - TREATMENT OF REFRACTORY NAUSEA  08/19/20   Patient referred to study by Dr. Gudena.  Met with patient in private exam room for aproximately 10 minutes to provide patient with information about this study.  Presented patient with copy of informed consent form, protocol version date 04/04/19, and hippa form [place] active date 01/13/2020. Briefly explained to patient the purpose of the study along with possible risks and benefits. Informed patient that participation is completely voluntary.  Explained there are 2 parts to this study and patient could continue to part 2 which involves taking study drug combination for nausea at cycle 2 of chemotherapy. Informed patient she may receive placebo for one or more of the anti nausea medications and that it is blinded meaning neither patient or any of the study team will know which drugs she is taking. Informed patient that part 2 of the study depends on her level of nausea reported after her first cycle of chemotherapy. Informed patient the study requests blood sample prior to starting chemotherapy and questionnaires to be completed at various time points including prior to first chemotherapy.  Patient stated she was interested in participating in this study and she will review the consent form at home.  Informed patient that research nurse will contact her on Monday (08/23/20) to see if she has any questions about the study.  Informed patient if she decides to enroll then we can meet with her again when she comes in for chemotherapy education class and that the consent process will take approximately 30 minutes to complete. Patient verbalized understanding.   DCP-001 Use of a Clinical Trial Screening Tool to Address Cancer Health Disparities in the NCI Community Oncology Research Program: Informed patient briefly of the data collection study for patients being screened on NCI trials.  Informed patient research nurse will provide more information about  this one time data collection study at next visit. Gave patient a clinical trials informational brochure and my business card.  Encouraged patient to call or email if she has any questions or concerns before our next contact.  Thanked patient for her time and willingness to consider this study.  Patient verbalized understanding.     Clinical Research Coordinator I  08/19/20 1:45 PM  

## 2020-08-20 ENCOUNTER — Telehealth: Payer: Self-pay | Admitting: Genetic Counselor

## 2020-08-20 ENCOUNTER — Encounter: Payer: Self-pay | Admitting: *Deleted

## 2020-08-20 ENCOUNTER — Ambulatory Visit: Payer: Self-pay | Admitting: Genetic Counselor

## 2020-08-20 ENCOUNTER — Ambulatory Visit
Admission: RE | Admit: 2020-08-20 | Discharge: 2020-08-20 | Disposition: A | Discharge status: Home / Self Care | Payer: Medicaid Other | Source: Ambulatory Visit | Attending: Hematology and Oncology | Admitting: Hematology and Oncology

## 2020-08-20 ENCOUNTER — Other Ambulatory Visit (HOSPITAL_COMMUNITY): Payer: Self-pay | Admitting: Diagnostic Radiology

## 2020-08-20 ENCOUNTER — Ambulatory Visit
Admission: RE | Admit: 2020-08-20 | Discharge: 2020-08-20 | Disposition: A | Discharge status: Home / Self Care | Payer: Self-pay | Source: Ambulatory Visit | Attending: Hematology and Oncology | Admitting: Hematology and Oncology

## 2020-08-20 ENCOUNTER — Encounter: Payer: Self-pay | Admitting: Licensed Clinical Social Worker

## 2020-08-20 DIAGNOSIS — C50411 Malignant neoplasm of upper-outer quadrant of right female breast: Secondary | ICD-10-CM

## 2020-08-20 DIAGNOSIS — Z170 Estrogen receptor positive status [ER+]: Secondary | ICD-10-CM

## 2020-08-20 DIAGNOSIS — Z1379 Encounter for other screening for genetic and chromosomal anomalies: Secondary | ICD-10-CM

## 2020-08-20 MED ORDER — GADOBUTROL 1 MMOL/ML IV SOLN
8.0000 mL | Freq: Once | INTRAVENOUS | Status: AC | PRN
  Administered 2020-08-20: 8 mL via INTRAVENOUS

## 2020-08-20 NOTE — Progress Notes (Addendum)
HPI:  Ms. Decook was previously seen in the [facility] clinic due to a personal and family history of cancer and concerns regarding a hereditary predisposition to cancer. Please refer to our prior cancer genetics clinic note for more information regarding our discussion, assessment and recommendations, at the time. Ms. Fenner recent genetic test results were disclosed to her, as were recommendations warranted by these results. These results and recommendations are discussed in more detail below.  CANCER HISTORY:  Oncology History  Malignant neoplasm of upper-outer quadrant of right breast in female, estrogen receptor positive ([place])  07/28/2020 Initial Diagnosis   Patient palpated a right breast mass for 1-2 years. Mammogram showed a 2.2cm mass at the 11 o'clock position with surrounding calcifications, 6.4cm in total extent, and up to 5 abnormal right axillary lymph nodes. 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    08/05/2020 Miscellaneous   MammaPrint: High risk luminal type B   08/11/2020 Genetic Testing   Negative genetic testing: no pathogenic variants detected in [facility] Breast Cancer STAT Panel or Common Hereditary Cancers panel. The report dates are August 11, 2020 and August 19, 2020, respectively. Two variants of uncertain signficance were detected - one in the CTNNA1 gene called c.86del and the second in the MLH1 gene called c.808A>G.   The STAT Breast cancer panel offered by [facility] includes sequencing and rearrangement analysis for the following 9 genes:  ATM, BRCA1, BRCA2, CDH1, CHEK2, PALB2, PTEN, STK11 and TP53.  The Common Hereditary Cancers Panel offered by [facility] includes sequencing and/or deletion duplication testing of the following 48 genes: APC, ATM, AXIN2, BARD1, BMPR1A, BRCA1, BRCA2, BRIP1, CDH1, CDK4, CDKN2A (p14ARF),  CDKN2A (p16INK4a), CHEK2, CTNNA1, DICER1, EPCAM (Deletion/duplication testing only), GREM1 (promoter region deletion/duplication testing only), KIT, MEN1, MLH1, MSH2, MSH3, MSH6, MUTYH, NBN, NF1, NTHL1, PALB2, PDGFRA, PMS2, POLD1, POLE, PTEN, RAD50, RAD51C, RAD51D, RNF43, SDHB, SDHC, SDHD, SMAD4, SMARCA4. STK11, TP53, TSC1, TSC2, and VHL.  The following genes were evaluated for sequence changes only: SDHA and HOXB13 c.251G>A variant only.    09/02/2020 -  Chemotherapy   The patient had dexamethasone (DECADRON) 4 MG tablet, 4 mg (100 % of original dose 4 mg), Oral, Daily, 1 of 1 cycle, Start date: 08/19/2020, End date: -- Dose modification: 4 mg (original dose 4 mg, Cycle 0) DOXOrubicin (ADRIAMYCIN) chemo injection 122 mg, 60 mg/m2 = 122 mg, Intravenous,  Once, 0 of 4 cycles palonosetron (ALOXI) injection 0.25 mg, 0.25 mg, Intravenous,  Once, 0 of 8 cycles pegfilgrastim-jmdb (FULPHILA) injection 6 mg, 6 mg, Subcutaneous,  Once, 0 of 4 cycles cyclophosphamide (CYTOXAN) 1,220 mg in sodium chloride 0.9 % 250 mL chemo infusion, 600 mg/m2 = 1,220 mg, Intravenous,  Once, 0 of 4 cycles PACLitaxel (TAXOL) 162 mg in sodium chloride 0.9 % 250 mL chemo infusion (</= 30m/m2), 80 mg/m2 = 162 mg, Intravenous,  Once, 0 of 4 cycles fosaprepitant (EMEND) 150 mg in sodium chloride 0.9 % 145 mL IVPB, 150 mg, Intravenous,  Once, 0 of 8 cycles  for chemotherapy treatment.      FAMILY HISTORY:  We obtained a detailed, 4-generation family history.  Significant diagnoses are listed below: Family History  Problem Relation Age of Onset  . Hypertension Mother   . Colon cancer Sister 321 . Diabetes Maternal Grandmother   . Cancer Maternal Aunt  unknown type dx late 66s   Ms. Sobieski has two sons (ages 24 and 46). She has one sister (age 31) who has a history of colon cancer diagnosed at the age of 55. She does not know if her sister has had genetic testing.  Ms. Neeb mother is 64 and has not had cancer. Ms.  Weich has two maternal uncles and six maternal aunts. One aunt was diagnosed with cancer in her late 51s, although Ms. Goffredo does not know what type of cancer this was. Her maternal grandmother died at age 77 and her maternal grandfather died in his early 17s.  Ms. Laning father is 58 and has not had cancer. She does not know if she has any paternal aunts or uncles. Her paternal grandmother died older than 30, and she does not know how old her paternal grandfather was when he died.  Ms. Koslow is [place] of previous family history of genetic testing for hereditary cancer risks. Patient's ancestors are of Black/African American, Native American, and White/Caucasian descent. There is no reported Ashkenazi Jewish ancestry. There is no known consanguinity.  GENETIC TEST RESULTS: Genetic testing reported out on 08/19/2020 through the [facility] Common Hereditary Cancers panel, and on 08/11/2020 through the Breast Cancer STAT panel. No pathogenic variants were detected.   The Breast Cancer STAT Panel offered by [facility] includes sequencing and deletion/duplication analysis for the following 9 genes:  ATM, BRCA1, BRCA2, CDH1, CHEK2, PALB2, PTEN, STK11 and TP53. The Common Hereditary Cancers Panel offered by [facility] includes sequencing and/or deletion duplication testing of the following 48 genes: APC, ATM, AXIN2, BARD1, BMPR1A, BRCA1, BRCA2, BRIP1, CDH1, CDK4, CDKN2A (p14ARF), CDKN2A (p16INK4a), CHEK2, CTNNA1, DICER1, EPCAM (Deletion/duplication testing only), GREM1 (promoter region deletion/duplication testing only), KIT, MEN1, MLH1, MSH2, MSH3, MSH6, MUTYH, NBN, NF1, NTHL1, PALB2, PDGFRA, PMS2, POLD1, POLE, PTEN, RAD50, RAD51C, RAD51D, RNF43, SDHB, SDHC, SDHD, SMAD4, SMARCA4. STK11, TP53, TSC1, TSC2, and VHL.  The following genes were evaluated for sequence changes only: SDHA and HOXB13 c.251G>A variant only. The test report will be scanned into EPIC and located under the Molecular Pathology section of the  Results Review tab.  A portion of the result report is included below for reference.     We discussed with Ms. Urbanik that because current genetic testing is not perfect, it is possible there may be a gene mutation in one of these genes that current testing cannot detect, but that chance is small.  We also discussed that there could be another gene that has not yet been discovered, or that we have not yet tested, that is responsible for the cancer diagnoses in the family. It is also possible there is a hereditary cause for the cancer in the family that Ms. Vanamburg did not inherit and therefore was not identified in her testing.  Therefore, it is important to remain in touch with cancer genetics in the future so that we can continue to offer Ms. Bobby the most up to date genetic testing.   Genetic testing did identify two variants of uncertain significance (VUS) - one in the CTNNA1 gene called c.86del and a second in the MLH1 gene called c.808A>G.  At this time, it is unknown if these variants are associated with increased cancer risk or if they are normal findings, but most variants such as these get reclassified to being inconsequential. They should not be used to make medical management decisions. With time, we suspect the lab will determine the significance of these variants, if any. If we  do learn more about them, we will try to contact Ms. Toda to discuss it further. However, it is important to stay in touch with [place] periodically and keep the address and phone number up to date.  UPDATE:  The MLH1 c.808A>G VUS was reclassified to "Likely Benign" on 02/19/2021. The change in variant classification was made as a result of re-review of the evidence in light of new variant interpretation guidelines and/or new information.   CANCER SCREENING RECOMMENDATIONS: Ms. Rilling test result is considered negative (normal).  This means that we have not identified a hereditary cause for her personal and family  history of cancer at this time. While reassuring, this does not definitively rule out a hereditary predisposition to cancer. It is still possible that there could be genetic mutations that are undetectable by current technology. There could be genetic mutations in genes that have not been tested or identified to increase cancer risk.  Therefore, it is recommended she continue to follow the cancer management and screening guidelines provided by her oncology and primary healthcare provider.   We discussed that Ms. Colantuono should have colonoscopies beginning younger than the general population and at more frequent intervals given the family history of colon cancer in her sister at age 3. Ms. Hudson has not previously had a colonoscopy and is interested in a referral to GI. We will make the referral to [facility].   An individual's cancer risk and medical management are not determined by genetic test results alone. Overall cancer risk assessment incorporates additional factors, including personal medical history, family history, and any available genetic information that may result in a personalized plan for cancer prevention and surveillance.  RECOMMENDATIONS FOR FAMILY MEMBERS:  Individuals in this family might be at some increased risk of developing cancer, over the general population risk, simply due to the family history of cancer.  We recommended women in this family have a yearly mammogram beginning at age 31, or 24 years younger than the earliest onset of cancer, an annual clinical breast exam, and perform monthly breast self-exams. Women in this family should also have a gynecological exam as recommended by their primary provider. All family members should be referred for colonoscopy as recommended by their physicians given the young diagnosis of colon cancer in the family.  It is also possible there is a hereditary cause for the cancer in Ms. Cayton's family that she did not inherit and therefore was  not identified in her.  Based on Ms. Heinicke's family history, we recommended her sister, who was diagnosed with colon cancer at age 58, have genetic counseling and testing. Ms. Mcneil will let us know if we can be of any assistance in coordinating genetic counseling and/or testing for this family member.   FOLLOW-UP: Lastly, we discussed with Ms. Coderre that cancer genetics is a rapidly advancing field and it is possible that new genetic tests will be appropriate for her and/or her family members in the future. We encouraged her to remain in contact with cancer genetics on an annual basis so we can update her personal and family histories and let her know of advances in cancer genetics that may benefit this family.   Our contact number was provided. Ms. Meggison questions were answered to her satisfaction, and she knows she is welcome to call us at anytime with additional questions or concerns.   Clint Guy, MS, [facility] Genetic Counselor [email or web address] Phone: [phone]

## 2020-08-20 NOTE — Progress Notes (Signed)
[place] CSW Progress Note  Clinical Education officer, museum received last documents from patient for [employer] application. Application mailed today. They will contact patient directly via e-mail once application is received and processed.    Christeen Douglas , LCSW

## 2020-08-20 NOTE — Telephone Encounter (Signed)
Revealed negative genetic testing. Discussed that we do not know why she has breast cancer or why there is cancer in the family. There could be a genetic mutation in the family that Melody Rodriguez did not inherit. There could also be a mutation in a different gene that we are not testing, or our current technology may not be able detect certain mutations. It will therefore be important for her to stay in contact with genetics to keep up with whether additional testing may be appropriate in the future.  Two variants of uncertain significance were detected - one in the CTNNA1 gene called c.86del and a second in the MLH1 gene called c.808A>G. Her result is still considered normal at this time and should not impact her medical management.

## 2020-08-23 ENCOUNTER — Encounter: Payer: Self-pay | Admitting: *Deleted

## 2020-08-23 ENCOUNTER — Telehealth: Payer: Self-pay | Admitting: *Deleted

## 2020-08-23 NOTE — Telephone Encounter (Signed)
Called pt with port placement by IR on 12/10 at 12pm arrival time after chemo class. Received confirmation. Discussed starting chemo on 12/14 to allow pt to go home to visit family for Christmas.  No further needs voiced at this time.

## 2020-08-24 ENCOUNTER — Telehealth: Payer: Self-pay | Admitting: Hematology and Oncology

## 2020-08-24 ENCOUNTER — Other Ambulatory Visit: Payer: Self-pay

## 2020-08-24 ENCOUNTER — Inpatient Hospital Stay: Payer: Medicaid Other

## 2020-08-24 VITALS — BP 143/76 | HR 72 | Temp 98.5°F | Resp 18 | Ht 64.0 in | Wt 200.0 lb

## 2020-08-24 DIAGNOSIS — D500 Iron deficiency anemia secondary to blood loss (chronic): Secondary | ICD-10-CM

## 2020-08-24 DIAGNOSIS — Z5111 Encounter for antineoplastic chemotherapy: Secondary | ICD-10-CM | POA: Diagnosis not present

## 2020-08-24 MED ORDER — SODIUM CHLORIDE 0.9 % IV SOLN
Freq: Once | INTRAVENOUS | Status: AC
  Filled 2020-08-24: qty 250

## 2020-08-24 MED ORDER — SODIUM CHLORIDE 0.9 % IV SOLN
300.0000 mg | Freq: Once | INTRAVENOUS | Status: AC
  Administered 2020-08-24: 300 mg via INTRAVENOUS
  Filled 2020-08-24: qty 10

## 2020-08-24 NOTE — Telephone Encounter (Signed)
No 12/2 los, no changes made to pt schedule

## 2020-08-24 NOTE — Patient Instructions (Signed)

## 2020-08-24 NOTE — Progress Notes (Signed)
Patient declines to wait 30 minute post observation period. Vital signs stable and IV catheter removed.

## 2020-08-25 ENCOUNTER — Telehealth: Payer: Self-pay | Admitting: *Deleted

## 2020-08-25 ENCOUNTER — Other Ambulatory Visit: Payer: Self-pay | Admitting: Radiology

## 2020-08-25 NOTE — Telephone Encounter (Signed)
Spoke to pt concerning pre-meds and refills as well a future appts.

## 2020-08-26 ENCOUNTER — Encounter: Payer: Self-pay | Admitting: *Deleted

## 2020-08-26 ENCOUNTER — Other Ambulatory Visit: Payer: Self-pay

## 2020-08-26 ENCOUNTER — Inpatient Hospital Stay: Payer: Medicaid Other

## 2020-08-26 VITALS — BP 140/77 | HR 76 | Temp 99.0°F | Resp 16

## 2020-08-26 DIAGNOSIS — D500 Iron deficiency anemia secondary to blood loss (chronic): Secondary | ICD-10-CM

## 2020-08-26 DIAGNOSIS — Z5111 Encounter for antineoplastic chemotherapy: Secondary | ICD-10-CM | POA: Diagnosis not present

## 2020-08-26 MED ORDER — SODIUM CHLORIDE 0.9 % IV SOLN
300.0000 mg | Freq: Once | INTRAVENOUS | Status: AC
  Administered 2020-08-26: 300 mg via INTRAVENOUS
  Filled 2020-08-26: qty 5

## 2020-08-26 MED ORDER — SODIUM CHLORIDE 0.9 % IV SOLN
Freq: Once | INTRAVENOUS | Status: AC
  Filled 2020-08-26: qty 250

## 2020-08-26 NOTE — Progress Notes (Signed)
Pharmacist Chemotherapy Monitoring - Initial Assessment    Anticipated start date: 09/01/20  Regimen:  . Are orders appropriate based on the patient's diagnosis, regimen, and cycle? Yes . Does the plan date match the patient's scheduled date? Yes . Is the sequencing of drugs appropriate? Yes . Are the premedications appropriate for the patient's regimen? Yes . Prior Authorization for treatment is: Not Started o If applicable, is the correct biosimilar selected based on the patient's insurance? yes  Organ Function and Labs: Marland Kitchen Are dose adjustments needed based on the patient's renal function, hepatic function, or hematologic function? No . Are appropriate labs ordered prior to the start of patient's treatment? Yes . Other organ system assessment, if indicated: women of childbearing potential: pregnancy status  & ECHO - Adriamycin . The following baseline labs, if indicated, have been ordered: N/A  Dose Assessment: . Are the drug doses appropriate? Yes . Are the following correct: o Drug concentrations Yes o IV fluid compatible with drug Yes o Administration routes Yes o Timing of therapy Yes . If applicable, does the patient have documented access for treatment and/or plans for port-a-cath placement? yes . If applicable, have lifetime cumulative doses been properly documented and assessed? yes Lifetime Dose Tracking  No doses have been documented on this patient for the following tracked chemicals: Doxorubicin, Epirubicin, Idarubicin, Daunorubicin, Mitoxantrone, Bleomycin, Oxaliplatin, Carboplatin, Liposomal Doxorubicin  o   Toxicity Monitoring/Prevention: . The patient has the following take home antiemetics prescribed: Ondansetron, Prochlorperazine and Dexamethasone . The patient has the following take home medications prescribed: N/A . Medication allergies and previous infusion related reactions, if applicable, have been reviewed and addressed. Yes . The patient's current  medication list has been assessed for drug-drug interactions with their chemotherapy regimen. no significant drug-drug interactions were identified on review.  Order Review: . Are the treatment plan orders signed? Yes . Is the patient scheduled to see a provider prior to their treatment? Yes  I verify that I have reviewed each item in the above checklist and answered each question accordingly.  Kennith Center P 08/26/2020 3:02 PM

## 2020-08-26 NOTE — Patient Instructions (Signed)

## 2020-08-26 NOTE — Progress Notes (Signed)
Patient completed infusion without incident. Stayed for 15 minutes of 30 minute post-observation period. VSS. In no visible distress at time of discharge. Ambulated out of cancer center. AVS declined.

## 2020-08-27 ENCOUNTER — Inpatient Hospital Stay: Payer: Medicaid Other | Admitting: Emergency Medicine

## 2020-08-27 ENCOUNTER — Ambulatory Visit (HOSPITAL_COMMUNITY)
Admission: RE | Admit: 2020-08-27 | Discharge: 2020-08-27 | Disposition: A | Discharge status: Home / Self Care | Payer: Medicaid Other | Source: Ambulatory Visit | Attending: Hematology and Oncology | Admitting: Hematology and Oncology

## 2020-08-27 ENCOUNTER — Encounter (HOSPITAL_COMMUNITY): Payer: Self-pay

## 2020-08-27 ENCOUNTER — Other Ambulatory Visit: Payer: Self-pay

## 2020-08-27 ENCOUNTER — Telehealth: Payer: Self-pay

## 2020-08-27 ENCOUNTER — Ambulatory Visit: Payer: Medicaid Other

## 2020-08-27 ENCOUNTER — Other Ambulatory Visit: Payer: Self-pay | Admitting: *Deleted

## 2020-08-27 ENCOUNTER — Inpatient Hospital Stay: Payer: Medicaid Other

## 2020-08-27 DIAGNOSIS — Z5181 Encounter for therapeutic drug level monitoring: Secondary | ICD-10-CM | POA: Insufficient documentation

## 2020-08-27 DIAGNOSIS — Z170 Estrogen receptor positive status [ER+]: Secondary | ICD-10-CM

## 2020-08-27 DIAGNOSIS — C50411 Malignant neoplasm of upper-outer quadrant of right female breast: Secondary | ICD-10-CM

## 2020-08-27 DIAGNOSIS — Z5111 Encounter for antineoplastic chemotherapy: Secondary | ICD-10-CM | POA: Diagnosis not present

## 2020-08-27 DIAGNOSIS — Z0189 Encounter for other specified special examinations: Secondary | ICD-10-CM

## 2020-08-27 LAB — ECHOCARDIOGRAM COMPLETE
Area-P 1/2: 4.06 cm2
S' Lateral: 2.8 cm

## 2020-08-27 LAB — PREGNANCY, URINE: Preg Test, Ur: NEGATIVE

## 2020-08-27 MED ORDER — SODIUM CHLORIDE 0.9 % IV SOLN: INTRAVENOUS | Status: DC

## 2020-08-27 MED ORDER — LIDOCAINE-PRILOCAINE 2.5-2.5 % EX CREA
TOPICAL_CREAM | CUTANEOUS | Status: AC
  Filled 2020-08-27: qty 5

## 2020-08-27 MED ORDER — CEFAZOLIN SODIUM-DEXTROSE 2-4 GM/100ML-% IV SOLN: 2.0000 g | INTRAVENOUS | Status: DC

## 2020-08-27 NOTE — Research (Addendum)
[id number] - TREATMENT OF REFRACTORY NAUSEA  08/27/20   Consent:  Alona Danford presented to the cancer today alone.  This Research officer, political party met with patient in private room along with Doreatha Martin, RN, for aproximately 45 minutes to review the informed consent and HIPAA forms with the patient.  Presented patient with copy of informed consent form, protocol version date 04/04/19, and HIPAA form [place] active date 01/13/2020.  The patient has had time to review the forms at home and states she is still interested in participating.  She did not have any questions at the beginning of our visit.  The informed consent form (ICF) was reviewed section by section with the patient.  She was informed that the study is voluntary and she may withdraw at any time.  We discussed the potential risks and benefits of participating including potential side effects of study drugs.  Pregnancy risks were discussed with the patient.  The patient's other options including the usual approach to treating chemotherapy-related nausea and vomiting and the study procedures beyond standard care including blood collection, questionnaires and taking additional anti-nausea (or placebo) medications were reviewed.  Informed patient of time points for the study activities. Discussed the study design including randomization and the that is it double blinded. Neither patient nor study staff will know if patient is taking the study drug (prochlorperzine or olanzapine) or placebo. Informed patient she will not be paid to participate and that routine care will billed to her or insurance as usual.  I encouraged patient to contact her insurance company prior to participating in the study.  Informed patient that participation in cycle 2 of the study depends on the level of nausea she has after cycle 1. Informed patient that the research coordinator will call patient on day 4 of cycle 1 to assess her symptoms. Patient circled "yes" on ICF that we  can leave messages on her voicemail. Patient also agreed to the optional use of her leftover blood for future research and circled "yes" to the optional studies. Patient verbalized understanding of study information, denied any questions and signed and dated the study ICF and HIPAA forms at 8:44am.  She was provided with a signed copy of the ICF and HIPAA form.  Patient requests to complete questionnaires online by e-mail link if she is able to enroll and confirmed her e-mail address as [email or web address].  Eligibility: Eligibility for the study was discussed including a negative pregnancy test within 10 days of starting chemotherapy treatment.  She agreed to have a urine pregnancy test performed today while at the clinic and she provided a urine specimen to the lab while here.  Dr. Lindi Adie was made aware of the patient's most recent blood glucose levels.  He did not consider the patient to have uncontrolled diabetes mellitus or uncontrolled hyperglycemia.  The patient's eligibility criteria was reviewed by this clinical research coordinator and patient is eligible for participation.   Dr. Lindi Adie signed the eligibility checklist today, 08/27/2020.  Secondary eligibility review was performed by clinical research nurse, Doristine Johns, RN 08/30/2020.  Thanked patient for her time today. Encouraged patient to call clinic if any questions before our next contact.   Clabe Seal Clinical Research Coordinator I  08/27/20 4:15 PM

## 2020-08-27 NOTE — Progress Notes (Signed)
Patient ID: Melody Rodriguez, female   DOB: 14-Jun-1976, 44 y.o.   MRN: [id number] Pt presented to IR department today for outpatient Port-A-Cath placement.  Patient drove herself to the hospital. Preprocedure instructions had been given to patient prior to arrival today.  Per IR and hospital protocol patients are not allowed to drive home same day following IV conscious sedation; patient cannot find individual to assist with picking up her vehicle after procedure ,therefore she has canceled port placement at this time.  She chose not to reschedule.  Voicemail left with Dr. Geralyn Flash nurse regarding above.

## 2020-08-27 NOTE — Telephone Encounter (Signed)
RN received call today from IR that patient was unable to get port placement due to not having a driver/family member present.   RN spoke with patient to inquire a new date when a family member would be available.  Pt verbalized she did not have anyone available to take her. Pt stated, "I'm done with doing anything related to treatment."  RN provided therapeutic communication, and collaborated with social work and Art therapist.   After discussion patient has agreed to have PICC line placed prior to treatment.  RN arranged for placement and patient has been update and aware.  Patient educated that flushes will need to be 3 times weekly, with weekly PICC dressing changes.    Patient later called back to update that her aunt will be in town and would like to schedule for port placement the last week in December.  RN left message with IR to have scheduled for last week in December.    Patient was very thankful for care and coordination.

## 2020-08-27 NOTE — Research (Signed)
DCP-001 Use of a Clinical Trial Screening Tool to Address Cancer Health Disparities in the [place] Program  08/27/20   Informed patient briefly of the data collection study for patients being screened on NCI trials.  Due to lack of time today, this will be discussed in more detail at her next visit.   The patient was thanked for her time.  Clabe Seal Clinical Research Coordinator I  08/27/20 4:00 PM

## 2020-08-27 NOTE — Progress Notes (Addendum)
Patient arrived and stated she drove herself. She arranged a ride and stated her son would be present at home, but she did not have a way to get her car home. Rowe Robert, PA spoke with patient and she stated she would have to reschedule, but chose not to at this time.

## 2020-08-27 NOTE — Progress Notes (Signed)
  Echocardiogram 2D Echocardiogram has been performed.  Orlene Salmons G Cira Deyoe 08/27/2020, 10:07 AM

## 2020-08-30 ENCOUNTER — Encounter: Payer: Self-pay | Admitting: *Deleted

## 2020-08-30 ENCOUNTER — Other Ambulatory Visit: Payer: Self-pay | Admitting: *Deleted

## 2020-08-30 ENCOUNTER — Encounter: Payer: Self-pay | Admitting: Emergency Medicine

## 2020-08-30 DIAGNOSIS — C50411 Malignant neoplasm of upper-outer quadrant of right female breast: Secondary | ICD-10-CM

## 2020-08-30 DIAGNOSIS — Z170 Estrogen receptor positive status [ER+]: Secondary | ICD-10-CM

## 2020-08-30 DIAGNOSIS — Z006 Encounter for examination for normal comparison and control in clinical research program: Secondary | ICD-10-CM

## 2020-08-30 NOTE — Research (Signed)
[id number] - TREATMENT OF REFRACTORY NAUSEA  08/30/20   Registration:  The patient meets all eligibility criteria and was registered to the [place] study using the [facility] registration portal.  Study ID #[id number] was assigned.  Called patient to inform her that she was registered to the study and to remind her to complete the questionnaires sent to her e-mail address prior to Wednesday.  She verbalized understanding.  I encouraged her to call with any questions.  Patient was thanked for her time and participation.  Clabe Seal Clinical Research Coordinator I  08/30/20 3:58 PM

## 2020-08-30 NOTE — Progress Notes (Signed)
The following biosimilar Udenyca (pegfilgrastim-cbqv) has been selected for use in this patient.  Attempting to obtain patient assistance.  Kennith Center, Pharm.D., CPP 08/30/2020@11 :13 AM

## 2020-08-31 NOTE — Progress Notes (Signed)
Patient Care Team: Patient, [name] as PCP - General (General Practice) Rockwell Germany, RN as Oncology Nurse Navigator Mauro Kaufmann, RN as Oncology Nurse Navigator [name], MD as Consulting Physician (General Surgery) Nicholas Lose, MD as Consulting Physician (Hematology and Oncology) Gery Pray, MD as Consulting Physician (Radiation Oncology)  DIAGNOSIS:    ICD-10-CM   1. Malignant neoplasm of upper-outer quadrant of right breast in female, estrogen receptor positive ([place])  C50.411    Z17.0     SUMMARY OF ONCOLOGIC HISTORY: Oncology History  Malignant neoplasm of upper-outer quadrant of right breast in female, estrogen receptor positive ([place])  07/28/2020 Initial Diagnosis   Patient palpated a right breast mass for 1-2 years. Mammogram showed a 2.2cm mass at the 11 o'clock position with surrounding calcifications, 6.4cm in total extent, and up to 5 abnormal right axillary lymph nodes. 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    08/05/2020 Miscellaneous   MammaPrint: High risk luminal type B   08/11/2020 Genetic Testing   Negative genetic testing: no pathogenic variants detected in [facility] Breast Cancer STAT Panel or Common Hereditary Cancers panel. The report dates are August 11, 2020 and August 19, 2020, respectively. Two variants of uncertain signficance were detected - one in the CTNNA1 gene called c.86del and the second in the MLH1 gene called c.808A>G.   The STAT Breast cancer panel offered by [facility] includes sequencing and rearrangement analysis for the following 9 genes:  ATM, BRCA1, BRCA2, CDH1, CHEK2, PALB2, PTEN, STK11 and TP53.  The Common Hereditary Cancers Panel offered by [facility] includes sequencing and/or deletion duplication testing of the following 48 genes: APC, ATM, AXIN2, BARD1, BMPR1A, BRCA1, BRCA2, BRIP1,  CDH1, CDK4, CDKN2A (p14ARF), CDKN2A (p16INK4a), CHEK2, CTNNA1, DICER1, EPCAM (Deletion/duplication testing only), GREM1 (promoter region deletion/duplication testing only), KIT, MEN1, MLH1, MSH2, MSH3, MSH6, MUTYH, NBN, NF1, NTHL1, PALB2, PDGFRA, PMS2, POLD1, POLE, PTEN, RAD50, RAD51C, RAD51D, RNF43, SDHB, SDHC, SDHD, SMAD4, SMARCA4. STK11, TP53, TSC1, TSC2, and VHL.  The following genes were evaluated for sequence changes only: SDHA and HOXB13 c.251G>A variant only.    09/01/2020 -  Chemotherapy   The patient had dexamethasone (DECADRON) 4 MG tablet, 4 mg (100 % of original dose 4 mg), Oral, Daily, 1 of 1 cycle, Start date: 08/19/2020, End date: -- Dose modification: 4 mg (original dose 4 mg, Cycle 0) DOXOrubicin (ADRIAMYCIN) chemo injection 122 mg, 60 mg/m2 = 122 mg, Intravenous,  Once, 0 of 4 cycles palonosetron (ALOXI) injection 0.25 mg, 0.25 mg, Intravenous,  Once, 0 of 8 cycles pegfilgrastim-cbqv (UDENYCA) injection 6 mg, 6 mg, Subcutaneous, Once, 0 of 4 cycles cyclophosphamide (CYTOXAN) 1,220 mg in sodium chloride 0.9 % 250 mL chemo infusion, 600 mg/m2 = 1,220 mg, Intravenous,  Once, 0 of 4 cycles PACLitaxel (TAXOL) 162 mg in sodium chloride 0.9 % 250 mL chemo infusion (</= 19m/m2), 80 mg/m2 = 162 mg, Intravenous,  Once, 0 of 4 cycles fosaprepitant (EMEND) 150 mg in sodium chloride 0.9 % 145 mL IVPB, 150 mg, Intravenous,  Once, 0 of 8 cycles  for chemotherapy treatment.      CHIEF COMPLIANT: Cycle 1 Adriamycin and Cytoxan  INTERVAL HISTORY: Melody Foodyis a 44y.o. with above-mentioned history of right breast cancer currently on neoadjuvant chemotherapy with dose dense Adriamycin and Cytoxan. She presents to the clinic today for cycle 1.  She had a PICC line placed and is ready to get started with treatment.  She is also on the nausea trial URCC study.  ALLERGIES:  is allergic to latex.  MEDICATIONS:  Current Outpatient Medications  Medication Sig Dispense Refill  . azithromycin  (ZITHROMAX Z-PAK) 250 MG tablet Take as directed 6 each 0  . cyclobenzaprine (FLEXERIL) 5 MG tablet Take 1 tablet (5 mg total) by mouth 3 (three) times daily as needed for muscle spasms. 20 tablet 0  . dexamethasone (DECADRON) 4 MG tablet Take 1 tablet (4 mg total) by mouth daily. Take 1 tablet day after chemo and 1 tablet 2 days after chemo with food 8 tablet 0  . lidocaine-prilocaine (EMLA) cream Apply to affected area once 30 g 3  . ondansetron (ZOFRAN) 8 MG tablet Take 1 tablet (8 mg total) by mouth 2 (two) times daily as needed. Start on the third day after chemotherapy. 30 tablet 1  . prochlorperazine (COMPAZINE) 10 MG tablet Take 1 tablet (10 mg total) by mouth every 6 (six) hours as needed (Nausea or vomiting). 30 tablet 1  . tamoxifen (NOLVADEX) 20 MG tablet Take 1 tablet (20 mg total) by mouth daily. 90 tablet 3   No current facility-administered medications for this visit.   Facility-Administered Medications Ordered in Other Visits  Medication Dose Route Frequency Provider Last Rate Last Admin  . lidocaine (XYLOCAINE) 1 % (with pres) injection             PHYSICAL EXAMINATION: ECOG PERFORMANCE STATUS: 1 - Symptomatic but completely ambulatory  Vitals:   09/01/20 1019  BP: 133/72  Pulse: 73  Resp: 18  Temp: 98 F (36.7 C)  SpO2: 100%   Filed Weights   09/01/20 1019  Weight: 198 lb 6.4 oz (90 kg)    LABORATORY DATA:  I have reviewed the data as listed CMP Latest Ref Rng & Units 08/04/2020 01/12/2020  Glucose 70 - 99 mg/dL 191(H) 150(H)  BUN 6 - 20 mg/dL 10 7  Creatinine 0.44 - 1.00 mg/dL 0.77 0.67  Sodium 135 - 145 mmol/L 141 140  Potassium 3.5 - 5.1 mmol/L 3.4(L) 3.7  Chloride 98 - 111 mmol/L 108 108  CO2 22 - 32 mmol/L 24 25  Calcium 8.9 - 10.3 mg/dL 8.6(L) 8.6(L)  Total Protein 6.5 - 8.1 g/dL 6.9 7.0  Total Bilirubin 0.3 - 1.2 mg/dL 0.3 0.7  Alkaline Phos 38 - 126 U/L 59 58  AST 15 - 41 U/L 13(L) 13(L)  ALT 0 - 44 U/L 10 12    Lab Results  Component Value  Date   WBC 9.7 09/01/2020   HGB 10.9 (L) 09/01/2020   HCT 35.7 (L) 09/01/2020   MCV 81.9 09/01/2020   PLT 257 09/01/2020   NEUTROABS 6.6 09/01/2020    ASSESSMENT & PLAN:  Malignant neoplasm of upper-outer quadrant of right breast in female, estrogen receptor positive ([place]) 07/28/2020:Patient palpated a right breast mass for 1-2 years. Mammogram showed a 2.2cm mass at the 11 o'clock position with surrounding calcifications, 6.4cm in total extent, and up to 5 abnormal right axillary lymph nodes. 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  Treatment plan: 1.Neoadjuvant chemotherapy (MammaPrint test High Risk): AC  foll by Taxol  2.mastectomy versus breast conserving surgery with targeted node dissection 3.Adjuvant radiation therapy 4.Follow-up adjuvant antiestrogen therapy URCC 16070: Treatment of  refractory nausea ------------------------------------------------------------------------------------------------------------------------------- Current Treatment: Cycle 1 Adriamycin and Cytoxan Port could not be placed.  She now has a PICC line. Echocardiogram 08/27/2020: EF 55 to 60%: Normal Labs reviewed, chemo consent obtained, chemo education completed Return to clinic in 1 week for toxicity check     No orders of the defined types were placed in this encounter.  The patient has a good understanding of the overall plan. she agrees with it. she will call with any problems that may develop before the next visit here.  Total time spent: 30 mins including face to face time and time spent for planning, charting and coordination of care  Nicholas Lose, MD 09/01/2020  I, Cloyde Reams Dorshimer, am acting as scribe for Dr. Nicholas Lose.  I have reviewed the above documentation for accuracy and completeness, and I agree with the above.

## 2020-09-01 ENCOUNTER — Encounter: Payer: Self-pay | Admitting: Emergency Medicine

## 2020-09-01 ENCOUNTER — Other Ambulatory Visit: Payer: Self-pay

## 2020-09-01 ENCOUNTER — Inpatient Hospital Stay: Payer: Medicaid Other

## 2020-09-01 ENCOUNTER — Inpatient Hospital Stay (HOSPITAL_BASED_OUTPATIENT_CLINIC_OR_DEPARTMENT_OTHER): Payer: Medicaid Other | Admitting: Medical

## 2020-09-01 ENCOUNTER — Encounter: Payer: Self-pay | Admitting: Licensed Clinical Social Worker

## 2020-09-01 ENCOUNTER — Ambulatory Visit (HOSPITAL_COMMUNITY)
Admission: RE | Admit: 2020-09-01 | Discharge: 2020-09-01 | Disposition: A | Discharge status: Home / Self Care | Payer: Medicaid Other | Source: Ambulatory Visit | Attending: Hematology and Oncology | Admitting: Hematology and Oncology

## 2020-09-01 ENCOUNTER — Other Ambulatory Visit: Payer: Self-pay | Admitting: Hematology and Oncology

## 2020-09-01 ENCOUNTER — Inpatient Hospital Stay (HOSPITAL_BASED_OUTPATIENT_CLINIC_OR_DEPARTMENT_OTHER): Payer: Medicaid Other | Admitting: Hematology and Oncology

## 2020-09-01 ENCOUNTER — Other Ambulatory Visit: Payer: Self-pay | Admitting: *Deleted

## 2020-09-01 ENCOUNTER — Telehealth: Payer: Self-pay

## 2020-09-01 ENCOUNTER — Encounter: Payer: Self-pay | Admitting: *Deleted

## 2020-09-01 VITALS — BP 128/68 | HR 70 | Temp 98.2°F | Resp 18

## 2020-09-01 DIAGNOSIS — C50411 Malignant neoplasm of upper-outer quadrant of right female breast: Secondary | ICD-10-CM | POA: Diagnosis not present

## 2020-09-01 DIAGNOSIS — Z170 Estrogen receptor positive status [ER+]: Secondary | ICD-10-CM

## 2020-09-01 DIAGNOSIS — D500 Iron deficiency anemia secondary to blood loss (chronic): Secondary | ICD-10-CM

## 2020-09-01 DIAGNOSIS — Z5111 Encounter for antineoplastic chemotherapy: Secondary | ICD-10-CM | POA: Diagnosis not present

## 2020-09-01 DIAGNOSIS — T8090XA Unspecified complication following infusion and therapeutic injection, initial encounter: Secondary | ICD-10-CM

## 2020-09-01 DIAGNOSIS — R0602 Shortness of breath: Secondary | ICD-10-CM

## 2020-09-01 DIAGNOSIS — Z006 Encounter for examination for normal comparison and control in clinical research program: Secondary | ICD-10-CM

## 2020-09-01 LAB — CBC WITH DIFFERENTIAL (CANCER CENTER ONLY)
Abs Immature Granulocytes: 0.02 10*3/uL (ref 0.00–0.07)
Basophils Absolute: 0.1 10*3/uL (ref 0.0–0.1)
Basophils Relative: 1 %
Eosinophils Absolute: 0.2 10*3/uL (ref 0.0–0.5)
Eosinophils Relative: 2 %
HCT: 35.7 % — ABNORMAL LOW (ref 36.0–46.0)
Hemoglobin: 10.9 g/dL — ABNORMAL LOW (ref 12.0–15.0)
Immature Granulocytes: 0 %
Lymphocytes Relative: 25 %
Lymphs Abs: 2.4 10*3/uL (ref 0.7–4.0)
MCH: 25 pg — ABNORMAL LOW (ref 26.0–34.0)
MCHC: 30.5 g/dL (ref 30.0–36.0)
MCV: 81.9 fL (ref 80.0–100.0)
Monocytes Absolute: 0.4 10*3/uL (ref 0.1–1.0)
Monocytes Relative: 4 %
Neutro Abs: 6.6 10*3/uL (ref 1.7–7.7)
Neutrophils Relative %: 68 %
Platelet Count: 257 10*3/uL (ref 150–400)
RBC: 4.36 MIL/uL (ref 3.87–5.11)
RDW: 20.9 % — ABNORMAL HIGH (ref 11.5–15.5)
WBC Count: 9.7 10*3/uL (ref 4.0–10.5)
nRBC: 0 % (ref 0.0–0.2)

## 2020-09-01 LAB — CMP (CANCER CENTER ONLY)
ALT: 13 U/L (ref 0–44)
AST: 12 U/L — ABNORMAL LOW (ref 15–41)
Albumin: 3.9 g/dL (ref 3.5–5.0)
Alkaline Phosphatase: 48 U/L (ref 38–126)
Anion gap: 1 — ABNORMAL LOW (ref 5–15)
BUN: 11 mg/dL (ref 6–20)
CO2: 27 mmol/L (ref 22–32)
Calcium: 8.7 mg/dL — ABNORMAL LOW (ref 8.9–10.3)
Chloride: 115 mmol/L — ABNORMAL HIGH (ref 98–111)
Creatinine: 0.73 mg/dL (ref 0.44–1.00)
GFR, Estimated: >60 mL/min (ref >60)
Glucose, Bld: 109 mg/dL — ABNORMAL HIGH (ref 70–99)
Potassium: 3.9 mmol/L (ref 3.5–5.1)
Sodium: 143 mmol/L (ref 135–145)
Total Bilirubin: 0.5 mg/dL (ref 0.3–1.2)
Total Protein: 7.1 g/dL (ref 6.5–8.1)

## 2020-09-01 LAB — RESEARCH LABS

## 2020-09-01 MED ORDER — DOXORUBICIN HCL CHEMO IV INJECTION 2 MG/ML
60.0000 mg/m2 | Freq: Once | INTRAVENOUS | Status: AC
  Administered 2020-09-01: 122 mg via INTRAVENOUS
  Filled 2020-09-01: qty 61

## 2020-09-01 MED ORDER — SODIUM CHLORIDE 0.9% FLUSH
10.0000 mL | INTRAVENOUS | Status: AC | PRN
  Administered 2020-09-01: 10 mL
  Filled 2020-09-01: qty 10

## 2020-09-01 MED ORDER — SODIUM CHLORIDE 0.9% FLUSH
10.0000 mL | INTRAVENOUS | Status: DC | PRN
  Filled 2020-09-01: qty 10

## 2020-09-01 MED ORDER — SODIUM CHLORIDE 0.9 % IV SOLN
Freq: Once | INTRAVENOUS | Status: AC
  Filled 2020-09-01: qty 250

## 2020-09-01 MED ORDER — SODIUM CHLORIDE 0.9 % IV SOLN
10.0000 mg | Freq: Once | INTRAVENOUS | Status: AC
  Administered 2020-09-01: 10 mg via INTRAVENOUS
  Filled 2020-09-01: qty 10

## 2020-09-01 MED ORDER — HEPARIN SOD (PORK) LOCK FLUSH 100 UNIT/ML IV SOLN
250.0000 [IU] | Freq: Once | INTRAVENOUS | Status: AC | PRN
  Administered 2020-09-01: 250 [IU]
  Filled 2020-09-01: qty 5

## 2020-09-01 MED ORDER — PALONOSETRON HCL INJECTION 0.25 MG/5ML
0.2500 mg | Freq: Once | INTRAVENOUS | Status: AC
  Administered 2020-09-01: 0.25 mg via INTRAVENOUS

## 2020-09-01 MED ORDER — SODIUM CHLORIDE 0.9 % IV SOLN
150.0000 mg | Freq: Once | INTRAVENOUS | Status: AC
  Administered 2020-09-01: 150 mg via INTRAVENOUS
  Filled 2020-09-01: qty 150

## 2020-09-01 MED ORDER — SODIUM CHLORIDE 0.9% FLUSH
3.0000 mL | Freq: Once | INTRAVENOUS | Status: DC | PRN
  Filled 2020-09-01: qty 10

## 2020-09-01 MED ORDER — SODIUM CHLORIDE 0.9% FLUSH
10.0000 mL | Freq: Once | INTRAVENOUS | Status: DC | PRN
  Filled 2020-09-01: qty 10

## 2020-09-01 MED ORDER — ACETAMINOPHEN 500 MG PO TABS
1000.0000 mg | ORAL_TABLET | Freq: Once | ORAL | Status: AC
  Administered 2020-09-01: 1000 mg via ORAL

## 2020-09-01 MED ORDER — METHYLPREDNISOLONE SODIUM SUCC 125 MG IJ SOLR
125.0000 mg | Freq: Once | INTRAMUSCULAR | Status: AC | PRN
  Administered 2020-09-01: 60 mg via INTRAVENOUS

## 2020-09-01 MED ORDER — PALONOSETRON HCL INJECTION 0.25 MG/5ML
INTRAVENOUS | Status: AC
  Filled 2020-09-01: qty 5

## 2020-09-01 MED ORDER — ACETAMINOPHEN 500 MG PO TABS
ORAL_TABLET | ORAL | Status: AC
  Filled 2020-09-01: qty 2

## 2020-09-01 MED ORDER — LIDOCAINE HCL 1 % IJ SOLN
INTRAMUSCULAR | Status: AC | PRN
Start: 2020-09-01 — End: 2020-09-01
  Administered 2020-09-01: 10 mL via INTRADERMAL

## 2020-09-01 MED ORDER — DIPHENHYDRAMINE HCL 50 MG/ML IJ SOLN
50.0000 mg | Freq: Once | INTRAMUSCULAR | Status: AC | PRN
  Administered 2020-09-01: 25 mg via INTRAVENOUS

## 2020-09-01 MED ORDER — SODIUM CHLORIDE 0.9 % IV SOLN
600.0000 mg/m2 | Freq: Once | INTRAVENOUS | Status: AC
  Administered 2020-09-01: 1220 mg via INTRAVENOUS
  Filled 2020-09-01: qty 61

## 2020-09-01 MED ORDER — LIDOCAINE HCL 1 % IJ SOLN
INTRAMUSCULAR | Status: AC
  Filled 2020-09-01: qty 20

## 2020-09-01 MED ORDER — FAMOTIDINE IN NACL 20-0.9 MG/50ML-% IV SOLN
20.0000 mg | Freq: Once | INTRAVENOUS | Status: AC | PRN
  Administered 2020-09-01: 20 mg via INTRAVENOUS

## 2020-09-01 MED ORDER — HEPARIN SOD (PORK) LOCK FLUSH 100 UNIT/ML IV SOLN
250.0000 [IU] | INTRAVENOUS | Status: AC | PRN
  Administered 2020-09-01: 250 [IU]
  Filled 2020-09-01: qty 5

## 2020-09-01 MED ORDER — HEPARIN SOD (PORK) LOCK FLUSH 100 UNIT/ML IV SOLN
500.0000 [IU] | Freq: Once | INTRAVENOUS | Status: DC | PRN
  Filled 2020-09-01: qty 5

## 2020-09-01 NOTE — Progress Notes (Signed)
At approximately 1435 during the Cytoxin infusion patient began to complain of tightness in the chest.  Infusion was stopped and emergency protocol was put in place.  Lucianne Lei tanner called to chair, see MAR.  The nurse was instructed to restart infusion after emergency medications given and symptoms resolved.  At 1540 patient began to complain of sinus burning.  Infusion was stopped and Lucianne Lei tanner called to chair.  See MAR.  Vital signs remained stable and nurse instructed to re-start infusion once symptoms resolved.  Patient was discharged in stable condition and instructed to call the center with any concerns.

## 2020-09-01 NOTE — Patient Instructions (Signed)
[facility] Discharge Instructions for Patients Receiving Chemotherapy  Today you received the following chemotherapy agents Doxorubicin; Cyclophosphamide  To help prevent nausea and vomiting after your treatment, we encourage you to take your nausea medication as directed If you develop nausea and vomiting that is not controlled by your nausea medication, call the clinic.   BELOW ARE SYMPTOMS THAT SHOULD BE REPORTED IMMEDIATELY:  *FEVER GREATER THAN 100.5 F  *CHILLS WITH OR WITHOUT FEVER  NAUSEA AND VOMITING THAT IS NOT CONTROLLED WITH YOUR NAUSEA MEDICATION  *UNUSUAL SHORTNESS OF BREATH  *UNUSUAL BRUISING OR BLEEDING  TENDERNESS IN MOUTH AND THROAT WITH OR WITHOUT PRESENCE OF ULCERS  *URINARY PROBLEMS  *BOWEL PROBLEMS  UNUSUAL RASH Items with * indicate a potential emergency and should be followed up as soon as possible.  Feel free to call the clinic should you have any questions or concerns. The clinic phone number is [phone].  Please show the [facility] at check-in to the Emergency Department and triage nurse.  Doxorubicin injection What is this medicine? DOXORUBICIN (dox oh ROO bi sin) is a chemotherapy drug. It is used to treat many kinds of cancer like leukemia, lymphoma, neuroblastoma, sarcoma, and Wilms' tumor. It is also used to treat bladder cancer, breast cancer, lung cancer, ovarian cancer, stomach cancer, and thyroid cancer. This medicine may be used for other purposes; ask your health care provider or pharmacist if you have questions. COMMON BRAND NAME(S): Adriamycin, Adriamycin PFS, Adriamycin RDF, Rubex What should I tell my health care provider before I take this medicine? They need to know if you have any of these conditions:  heart disease  history of low blood counts caused by a medicine  liver disease  recent or ongoing radiation therapy  an unusual or allergic reaction to doxorubicin, other chemotherapy agents, other  medicines, foods, dyes, or preservatives  pregnant or trying to get pregnant  breast-feeding How should I use this medicine? This drug is given as an infusion into a vein. It is administered in a hospital or clinic by a specially trained health care professional. If you have pain, swelling, burning or any unusual feeling around the site of your injection, tell your health care professional right away. Talk to your pediatrician regarding the use of this medicine in children. Special care may be needed. Overdosage: If you think you have taken too much of this medicine contact a poison control center or emergency room at once. NOTE: This medicine is only for you. Do not share this medicine with others. What if I miss a dose? It is important not to miss your dose. Call your doctor or health care professional if you are unable to keep an appointment. What may interact with this medicine? This medicine may interact with the following medications:  6-mercaptopurine  paclitaxel  phenytoin  St. John's Wort  trastuzumab  verapamil This list may not describe all possible interactions. Give your health care provider a list of all the medicines, herbs, non-prescription drugs, or dietary supplements you use. Also tell them if you smoke, drink alcohol, or use illegal drugs. Some items may interact with your medicine. What should I watch for while using this medicine? This drug may make you feel generally unwell. This is not uncommon, as chemotherapy can affect healthy cells as well as cancer cells. Report any side effects. Continue your course of treatment even though you feel ill unless your doctor tells you to stop. There is a maximum amount of this medicine  you should receive throughout your life. The amount depends on the medical condition being treated and your overall health. Your doctor will watch how much of this medicine you receive in your lifetime. Tell your doctor if you have taken this  medicine before. You may need blood work done while you are taking this medicine. Your urine may turn red for a few days after your dose. This is not blood. If your urine is dark or brown, call your doctor. In some cases, you may be given additional medicines to help with side effects. Follow all directions for their use. Call your doctor or health care professional for advice if you get a fever, chills or sore throat, or other symptoms of a cold or flu. Do not treat yourself. This drug decreases your body's ability to fight infections. Try to avoid being around people who are sick. This medicine may increase your risk to bruise or bleed. Call your doctor or health care professional if you notice any unusual bleeding. Talk to your doctor about your risk of cancer. You may be more at risk for certain types of cancers if you take this medicine. Do not become pregnant while taking this medicine or for 6 months after stopping it. Women should inform their doctor if they wish to become pregnant or think they might be pregnant. Men should not father a child while taking this medicine and for 6 months after stopping it. There is a potential for serious side effects to an unborn child. Talk to your health care professional or pharmacist for more information. Do not breast-feed an infant while taking this medicine. This medicine has caused ovarian failure in some women and reduced sperm counts in some men This medicine may interfere with the ability to have a child. Talk with your doctor or health care professional if you are concerned about your fertility. This medicine may cause a decrease in Co-Enzyme Q-10. You should make sure that you get enough Co-Enzyme Q-10 while you are taking this medicine. Discuss the foods you eat and the vitamins you take with your health care professional. What side effects may I notice from receiving this medicine? Side effects that you should report to your doctor or health care  professional as soon as possible:  allergic reactions like skin rash, itching or hives, swelling of the face, lips, or tongue  breathing problems  chest pain  fast or irregular heartbeat  low blood counts - this medicine may decrease the number of white blood cells, red blood cells and platelets. You may be at increased risk for infections and bleeding.  pain, redness, or irritation at site where injected  signs of infection - fever or chills, cough, sore throat, pain or difficulty passing urine  signs of decreased platelets or bleeding - bruising, pinpoint red spots on the skin, black, tarry stools, blood in the urine  swelling of the ankles, feet, hands  tiredness  weakness Side effects that usually do not require medical attention (report to your doctor or health care professional if they continue or are bothersome):  diarrhea  hair loss  mouth sores  nail discoloration or damage  nausea  red colored urine  vomiting This list may not describe all possible side effects. Call your doctor for medical advice about side effects. You may report side effects to FDA at 1-800-FDA-1088. Where should I keep my medicine? This drug is given in a hospital or clinic and will not be stored at home. NOTE: This sheet  is a summary. It may not cover all possible information. If you have questions about this medicine, talk to your doctor, pharmacist, or health care provider.  2020 Elsevier/Gold Standard (2017-04-18 11:01:26)  Cyclophosphamide Injection What is this medicine? CYCLOPHOSPHAMIDE (sye kloe FOSS fa mide) is a chemotherapy drug. It slows the growth of cancer cells. This medicine is used to treat many types of cancer like lymphoma, myeloma, leukemia, breast cancer, and ovarian cancer, to name a few. This medicine may be used for other purposes; ask your health care provider or pharmacist if you have questions. COMMON BRAND NAME(S): Cytoxan, Neosar What should I tell my health  care provider before I take this medicine? They need to know if you have any of these conditions:  heart disease  history of irregular heartbeat  infection  kidney disease  liver disease  low blood counts, like white cells, platelets, or red blood cells  on hemodialysis  recent or ongoing radiation therapy  scarring or thickening of the lungs  trouble passing urine  an unusual or allergic reaction to cyclophosphamide, other medicines, foods, dyes, or preservatives  pregnant or trying to get pregnant  breast-feeding How should I use this medicine? This drug is usually given as an injection into a vein or muscle or by infusion into a vein. It is administered in a hospital or clinic by a specially trained health care professional. Talk to your pediatrician regarding the use of this medicine in children. Special care may be needed. Overdosage: If you think you have taken too much of this medicine contact a poison control center or emergency room at once. NOTE: This medicine is only for you. Do not share this medicine with others. What if I miss a dose? It is important not to miss your dose. Call your doctor or health care professional if you are unable to keep an appointment. What may interact with this medicine?  amphotericin B  azathioprine  certain antivirals for HIV or hepatitis  certain medicines for blood pressure, heart disease, irregular heart beat  certain medicines that treat or prevent blood clots like warfarin  certain other medicines for cancer  cyclosporine  etanercept  indomethacin  medicines that relax muscles for surgery  medicines to increase blood counts  metronidazole This list may not describe all possible interactions. Give your health care provider a list of all the medicines, herbs, non-prescription drugs, or dietary supplements you use. Also tell them if you smoke, drink alcohol, or use illegal drugs. Some items may interact with your  medicine. What should I watch for while using this medicine? Your condition will be monitored carefully while you are receiving this medicine. You may need blood work done while you are taking this medicine. Drink water or other fluids as directed. Urinate often, even at night. Some products may contain alcohol. Ask your health care professional if this medicine contains alcohol. Be sure to tell all health care professionals you are taking this medicine. Certain medicines, like metronidazole and disulfiram, can cause an unpleasant reaction when taken with alcohol. The reaction includes flushing, headache, nausea, vomiting, sweating, and increased thirst. The reaction can last from 30 minutes to several hours. Do not become pregnant while taking this medicine or for 1 year after stopping it. Women should inform their health care professional if they wish to become pregnant or think they might be pregnant. Men should not father a child while taking this medicine and for 4 months after stopping it. There is potential for serious  side effects to an unborn child. Talk to your health care professional for more information. Do not breast-feed an infant while taking this medicine or for 1 week after stopping it. This medicine has caused ovarian failure in some women. This medicine may make it more difficult to get pregnant. Talk to your health care professional if you are concerned about your fertility. This medicine has caused decreased sperm counts in some men. This may make it more difficult to father a child. Talk to your health care professional if you are concerned about your fertility. Call your health care professional for advice if you get a fever, chills, or sore throat, or other symptoms of a cold or flu. Do not treat yourself. This medicine decreases your body's ability to fight infections. Try to avoid being around people who are sick. Avoid taking medicines that contain aspirin, acetaminophen,  ibuprofen, naproxen, or ketoprofen unless instructed by your health care professional. These medicines may hide a fever. Talk to your health care professional about your risk of cancer. You may be more at risk for certain types of cancer if you take this medicine. If you are going to need surgery or other procedure, tell your health care professional that you are using this medicine. Be careful brushing or flossing your teeth or using a toothpick because you may get an infection or bleed more easily. If you have any dental work done, tell your dentist you are receiving this medicine. What side effects may I notice from receiving this medicine? Side effects that you should report to your doctor or health care professional as soon as possible:  allergic reactions like skin rash, itching or hives, swelling of the face, lips, or tongue  breathing problems  nausea, vomiting  signs and symptoms of bleeding such as bloody or black, tarry stools; red or dark brown urine; spitting up blood or brown material that looks like coffee grounds; red spots on the skin; unusual bruising or bleeding from the eyes, gums, or nose  signs and symptoms of heart failure like fast, irregular heartbeat, sudden weight gain; swelling of the ankles, feet, hands  signs and symptoms of infection like fever; chills; cough; sore throat; pain or trouble passing urine  signs and symptoms of kidney injury like trouble passing urine or change in the amount of urine  signs and symptoms of liver injury like dark yellow or brown urine; general ill feeling or flu-like symptoms; light-colored stools; loss of appetite; nausea; right upper belly pain; unusually weak or tired; yellowing of the eyes or skin Side effects that usually do not require medical attention (report to your doctor or health care professional if they continue or are bothersome):  confusion  decreased hearing  diarrhea  facial flushing  hair  loss  headache  loss of appetite  missed menstrual periods  signs and symptoms of low red blood cells or anemia such as unusually weak or tired; feeling faint or lightheaded; falls  skin discoloration This list may not describe all possible side effects. Call your doctor for medical advice about side effects. You may report side effects to FDA at 1-800-FDA-1088. Where should I keep my medicine? This drug is given in a hospital or clinic and will not be stored at home. NOTE: This sheet is a summary. It may not cover all possible information. If you have questions about this medicine, talk to your doctor, pharmacist, or health care provider.  2020 Elsevier/Gold Standard (2019-06-09 09:53:29)

## 2020-09-01 NOTE — Assessment & Plan Note (Signed)
07/28/2020:Patient palpated a right breast mass for 1-2 years. Mammogram showed a 2.2cm mass at the 11 o'clock position with surrounding calcifications, 6.4cm in total extent, and up to 5 abnormal right axillary lymph nodes. 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  Treatment plan: 1.Neoadjuvant chemotherapy (MammaPrint test High Risk): AC  foll by Taxol  2.mastectomy versus breast conserving surgery with targeted node dissection 3.Adjuvant radiation therapy 4.Follow-up adjuvant antiestrogen therapy URCC 16070: Treatment of refractory nausea ------------------------------------------------------------------------------------------------------------------------------- Current Treatment: Cycle 1 Adriamycin and Cytoxan Port could not be placed.  She now has a PICC line. Echocardiogram 08/27/2020: EF 55 to 60%: Normal Labs reviewed, chemo consent obtained, chemo education completed Return to clinic in 1 week for toxicity check

## 2020-09-01 NOTE — Research (Signed)
DCP-001 Use of a Clinical Trial Screening Tool to Address Cancer Health Disparities in the [place] Program  09/01/20   Americus Scheurich presented to the clinic for her Cycle 1 of chemotherapy today. The DCP-001 study was discussed with the patient including that it was an optional additional study.  She declined to participate in this study.  The patient was thanked for her time.    Clabe Seal Clinical Research Coordinator I  09/01/20 11:48 AM

## 2020-09-01 NOTE — Research (Signed)
[id number] - TREATMENT OF REFRACTORY NAUSEA  09/01/20   Armando Reichert presented to the clinic for Cycle 1 of chemotherapy today.  Baseline Questionnaires:  Montia Haslip completed her baseline questionnaires online yesterday, 08/31/2020.    Research Specimen:  The patient's research labs were drawn at 10:05 am in kit # [id number] through her PICC line along with routine labs.  Cycle 1 visit:  The patient's medication list was reviewed and updated.  The patient was given the 4 day home record and was instructed on how to complete this over the next four days.  She was also given paper copies of the FACT-G form, Medical symptom checklist, and [facility] Antiemesis Tool to use if unable to complete questionnaires online.  I informed patient that I would call on Friday, 09/03/20,  to remind her to complete questionnaires.    The patient did not have any questions at this time.  I encouraged her to call with any questions.  Patient was thanked for her time and participation.  Clabe Seal Clinical Research Coordinator I  09/01/20 11:42 AM

## 2020-09-01 NOTE — Procedures (Signed)
PROCEDURE SUMMARY:  Successful placement of image-guided double lumen PICC line to the right brachial vein. Length 34 cm. Tip at lower SVC/RA. No complications. EBL = <3 ml. Ready for use.  Please see imaging section of Epic for full dictation.   Theresa Duty, NP 09/01/2020 9:46 AM

## 2020-09-01 NOTE — Progress Notes (Signed)
[facility] CSW Progress Note  Holiday representative met with patient briefly to provide second set of [employer]. Spoke briefly about difficulties with transportation to appts that she is not allowed to drive herself. CSW referred pt for [facility] transportation for appts like infusion. Pt is having aunt come out to help when she gets her port placed.    Christeen Douglas , LCSW

## 2020-09-01 NOTE — Telephone Encounter (Signed)
Patient informed [facility] Medicaid approved x 12 months (07/19/2020-07/18/2021) ( MID# [id number] L.

## 2020-09-02 ENCOUNTER — Telehealth: Payer: Self-pay | Admitting: *Deleted

## 2020-09-02 NOTE — Progress Notes (Signed)
..  The following Medication: Melody Rodriguez is approved for drug replacement program by [employer]. The enrollment period is from 09/01/2020 to 09/01/2021.  Reason for Assistance: Self Pay. ID: [id number] First DOS:09/03/2020.

## 2020-09-03 ENCOUNTER — Telehealth: Payer: Self-pay | Admitting: Hematology and Oncology

## 2020-09-03 ENCOUNTER — Other Ambulatory Visit: Payer: Self-pay

## 2020-09-03 ENCOUNTER — Inpatient Hospital Stay: Payer: Medicaid Other

## 2020-09-03 ENCOUNTER — Telehealth: Payer: Self-pay | Admitting: Emergency Medicine

## 2020-09-03 VITALS — BP 143/79 | HR 82 | Temp 98.5°F | Resp 18

## 2020-09-03 DIAGNOSIS — Z5111 Encounter for antineoplastic chemotherapy: Secondary | ICD-10-CM | POA: Diagnosis not present

## 2020-09-03 DIAGNOSIS — Z170 Estrogen receptor positive status [ER+]: Secondary | ICD-10-CM

## 2020-09-03 DIAGNOSIS — C50411 Malignant neoplasm of upper-outer quadrant of right female breast: Secondary | ICD-10-CM

## 2020-09-03 DIAGNOSIS — D500 Iron deficiency anemia secondary to blood loss (chronic): Secondary | ICD-10-CM

## 2020-09-03 MED ORDER — PEGFILGRASTIM-CBQV 6 MG/0.6ML ~~LOC~~ SOSY
PREFILLED_SYRINGE | SUBCUTANEOUS | Status: AC
  Filled 2020-09-03: qty 0.6

## 2020-09-03 MED ORDER — HEPARIN SOD (PORK) LOCK FLUSH 100 UNIT/ML IV SOLN
250.0000 [IU] | Freq: Once | INTRAVENOUS | Status: AC | PRN
  Administered 2020-09-03: 250 [IU]
  Filled 2020-09-03: qty 5

## 2020-09-03 MED ORDER — SODIUM CHLORIDE 0.9% FLUSH
10.0000 mL | Freq: Once | INTRAVENOUS | Status: AC | PRN
  Administered 2020-09-03: 10 mL
  Filled 2020-09-03: qty 10

## 2020-09-03 MED ORDER — PEGFILGRASTIM-CBQV 6 MG/0.6ML ~~LOC~~ SOSY
6.0000 mg | PREFILLED_SYRINGE | Freq: Once | SUBCUTANEOUS | Status: AC
  Administered 2020-09-03: 6 mg via SUBCUTANEOUS

## 2020-09-03 NOTE — Telephone Encounter (Signed)
No 12/15 los, no changes made to pt schedule

## 2020-09-03 NOTE — Telephone Encounter (Signed)
[id number] - TREATMENT OF REFRACTORY NAUSEA  09/03/20  2:21 pm  Cycle 1 Telephone call: Lashunda Greis  was called today (Cycle 1, Day 3) to remind her to complete questionnaires and 4 day home diary tomorrow.  She was also reminded to bring in the diary with her to her appointment next week on Tuesday, 09/07/20.    The patient reports having nausea of as great as 6 on the scale of 1-7.  She reports no changes in her medications since her visit last week.  No adverse events related to study procedures were reported.  I advised her that based on the current information she would be eligible to continue on to the cycle 2 portion of the study involving taking study medication, and she agreed that she was willing to proceed at this time.  I reminded her of the voluntary nature of the study.  Plan:  Will plan on confirming eligibility criteria for the cycle 2 portion of the study randomizing patient on Monday once she completes her diary and questionnaires.  She has an appointment with Dr. Lindi Adie scheduled on 09/07/20.  Her next cycle of chemo is scheduled for 09/14/20.  Clabe Seal Clinical Research Coordinator I  09/03/20  2:36 PM

## 2020-09-03 NOTE — Progress Notes (Signed)
    DATE:  09/01/2020                                          X  CHEMO/IMMUNOTHERAPY REACTION           MD:  Dr. Nicholas Lose   AGENT/BLOOD PRODUCT RECEIVING TODAY:               Adriamycin and Cytoxan   AGENT/BLOOD PRODUCT RECEIVING IMMEDIATELY PRIOR TO REACTION:           Cytoxan   VS: BP:      133/65   P:        69       SPO2:        100% on room air                  REACTION(S):            Shortness of breath, chest tightness, and nasal congestion   PREMEDS:      Aloxi, [place], and dexamethasone   INTERVENTION: Cytoxan was paused and the patient was given Pepcid 20 mg IV x1   Review of Systems  Review of Systems  Constitutional: Negative for chills, diaphoresis and fever.  HENT: Positive for congestion. Negative for trouble swallowing and voice change.   Respiratory: Positive for chest tightness and shortness of breath. Negative for cough and wheezing.   Cardiovascular: Negative for chest pain and palpitations.  Gastrointestinal: Negative for abdominal pain, constipation, diarrhea, nausea and vomiting.  Musculoskeletal: Negative for back pain and myalgias.  Neurological: Negative for dizziness, light-headedness and headaches.     Physical Exam  Physical Exam Constitutional:      General: She is not in acute distress.    Appearance: She is not diaphoretic.  HENT:     Head: Normocephalic and atraumatic.  Cardiovascular:     Rate and Rhythm: Normal rate and regular rhythm.     Heart sounds: Normal heart sounds. No murmur heard. No friction rub. No gallop.   Pulmonary:     Effort: Pulmonary effort is normal. No respiratory distress.     Breath sounds: Normal breath sounds. No wheezing or rales.  Skin:    General: Skin is warm and dry.     Findings: No erythema or rash.  Neurological:     Mental Status: She is alert.     OUTCOME:                 The patient's symptoms abated after she was given Pepcid 20 mg IV.  Cytoxan was restarted and completed without any  additional issues of concern.   Sandi Mealy, MHS, PA-C

## 2020-09-06 MED FILL — LIDOCAINE-PRILOCAINE CREAM: 2.5-2.5 | 30 days supply | Qty: 30 | Fill #0

## 2020-09-06 NOTE — Progress Notes (Signed)
Patient Care Team: Patient, [name] as PCP - General (General Practice) Rockwell Germany, RN as Oncology Nurse Navigator Mauro Kaufmann, RN as Oncology Nurse Navigator [name], MD as Consulting Physician (General Surgery) Nicholas Lose, MD as Consulting Physician (Hematology and Oncology) Gery Pray, MD as Consulting Physician (Radiation Oncology)  DIAGNOSIS:    ICD-10-CM   1. Malignant neoplasm of upper-outer quadrant of right breast in female, estrogen receptor positive ([place])  C50.411    Z17.0     SUMMARY OF ONCOLOGIC HISTORY: Oncology History  Malignant neoplasm of upper-outer quadrant of right breast in female, estrogen receptor positive ([place])  07/28/2020 Initial Diagnosis   Patient palpated a right breast mass for 1-2 years. Mammogram showed a 2.2cm mass at the 11 o'clock position with surrounding calcifications, 6.4cm in total extent, and up to 5 abnormal right axillary lymph nodes. 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    08/05/2020 Miscellaneous   MammaPrint: High risk luminal type B   08/11/2020 Genetic Testing   Negative genetic testing: no pathogenic variants detected in [facility] Breast Cancer STAT Panel or Common Hereditary Cancers panel. The report dates are August 11, 2020 and August 19, 2020, respectively. Two variants of uncertain signficance were detected - one in the CTNNA1 gene called c.86del and the second in the MLH1 gene called c.808A>G.   The STAT Breast cancer panel offered by [facility] includes sequencing and rearrangement analysis for the following 9 genes:  ATM, BRCA1, BRCA2, CDH1, CHEK2, PALB2, PTEN, STK11 and TP53.  The Common Hereditary Cancers Panel offered by [facility] includes sequencing and/or deletion duplication testing of the following 48 genes: APC, ATM, AXIN2, BARD1, BMPR1A, BRCA1, BRCA2, BRIP1,  CDH1, CDK4, CDKN2A (p14ARF), CDKN2A (p16INK4a), CHEK2, CTNNA1, DICER1, EPCAM (Deletion/duplication testing only), GREM1 (promoter region deletion/duplication testing only), KIT, MEN1, MLH1, MSH2, MSH3, MSH6, MUTYH, NBN, NF1, NTHL1, PALB2, PDGFRA, PMS2, POLD1, POLE, PTEN, RAD50, RAD51C, RAD51D, RNF43, SDHB, SDHC, SDHD, SMAD4, SMARCA4. STK11, TP53, TSC1, TSC2, and VHL.  The following genes were evaluated for sequence changes only: SDHA and HOXB13 c.251G>A variant only.    09/01/2020 -  Chemotherapy   The patient had dexamethasone (DECADRON) 4 MG tablet, 4 mg (100 % of original dose 4 mg), Oral, Daily, 1 of 1 cycle, Start date: 08/19/2020, End date: -- Dose modification: 4 mg (original dose 4 mg, Cycle 0) DOXOrubicin (ADRIAMYCIN) chemo injection 122 mg, 60 mg/m2 = 122 mg, Intravenous,  Once, 1 of 4 cycles Dose modification: 50 mg/m2 (original dose 60 mg/m2, Cycle 2, Reason: Dose not tolerated) Administration: 122 mg (09/01/2020) palonosetron (ALOXI) injection 0.25 mg, 0.25 mg, Intravenous,  Once, 1 of 8 cycles Administration: 0.25 mg (09/01/2020) pegfilgrastim-cbqv (UDENYCA) injection 6 mg, 6 mg, Subcutaneous, Once, 1 of 4 cycles Administration: 6 mg (09/03/2020) cyclophosphamide (CYTOXAN) 1,220 mg in sodium chloride 0.9 % 250 mL chemo infusion, 600 mg/m2 = 1,220 mg, Intravenous,  Once, 1 of 4 cycles Dose modification: 500 mg/m2 (original dose 600 mg/m2, Cycle 2, Reason: Dose not tolerated) Administration: 1,220 mg (09/01/2020) PACLitaxel (TAXOL) 162 mg in sodium chloride 0.9 % 250 mL chemo infusion (</= 62m/m2), 80 mg/m2 = 162 mg, Intravenous,  Once, 0 of 4 cycles fosaprepitant (EMEND) 150 mg in sodium chloride 0.9 % 145 mL IVPB, 150 mg, Intravenous,  Once, 1 of 8 cycles Administration: 150 mg (09/01/2020)  for chemotherapy treatment.  CHIEF COMPLIANT: Cycle 1 Day 7 Adriamycin and Cytoxan  INTERVAL HISTORY: Melody Rodriguez is a 44 y.o. with above-mentioned history of right breast cancer  currently on neoadjuvant chemotherapy with dose dense Adriamycin and Cytoxan. She presents to the clinic todayfor toxicity check following cycle 1.   She experience moderate to severe nausea and threw up once.  Her biggest complaint is fogginess in the head, occasional dizziness and lack of taste.  She feels hungry but she cannot eat because of lack of taste.  On yesterday her symptoms started to improve.  She is starting to eat better today.  She had a reaction to Cytoxan when she felt shortness of breath and some chest tightness.  She was given Benadryl and the treatment was resumed.  She blames the Benadryl for her fogginess in the head.  ALLERGIES:  is allergic to latex.  MEDICATIONS:  Current Outpatient Medications  Medication Sig Dispense Refill  . cyclobenzaprine (FLEXERIL) 5 MG tablet Take 1 tablet (5 mg total) by mouth 3 (three) times daily as needed for muscle spasms. 20 tablet 0  . dexamethasone (DECADRON) 4 MG tablet Take 1 tablet (4 mg total) by mouth daily. Take 1 tablet day after chemo and 1 tablet 2 days after chemo with food 8 tablet 0  . lidocaine-prilocaine (EMLA) cream Apply to affected area once 30 g 3  . ondansetron (ZOFRAN) 8 MG tablet Take 1 tablet (8 mg total) by mouth 2 (two) times daily as needed. Start on the third day after chemotherapy. 30 tablet 1   No current facility-administered medications for this visit.    PHYSICAL EXAMINATION: ECOG PERFORMANCE STATUS: 1 - Symptomatic but completely ambulatory  Vitals:   09/07/20 1104  BP: 132/77  Pulse: 83  Resp: 18  Temp: 99 F (37.2 C)  SpO2: 100%   Filed Weights   09/07/20 1104  Weight: 196 lb 3.2 oz (89 kg)    LABORATORY DATA:  I have reviewed the data as listed CMP Latest Ref Rng & Units 09/01/2020 08/04/2020 01/12/2020  Glucose 70 - 99 mg/dL 109(H) 191(H) 150(H)  BUN 6 - 20 mg/dL _0 Creatinine 0.44 - 1.00 mg/dL 0.73 0.77 0.67  Sodium 135 - 145 mmol/L 143 141 140  Potassium 3.5 - 5.1 mmol/L 3.9  3.4(L) 3.7  Chloride 98 - 111 mmol/L 115(H) 108 108  CO2 22 - 32 mmol/L _1 Calcium 8.9 - 10.3 mg/dL 8.7(L) 8.6(L) 8.6(L)  Total Protein 6.5 - 8.1 g/dL 7.1 6.9 7.0  Total Bilirubin 0.3 - 1.2 mg/dL 0.5 0.3 0.7  Alkaline Phos 38 - 126 U/L 48 59 58  AST 15 - 41 U/L 12(L) 13(L) 13(L)  ALT 0 - 44 U/L _2 Lab Results  Component Value Date   WBC 1.9 (L) 09/07/2020   HGB 11.3 (L) 09/07/2020   HCT 35.6 (L) 09/07/2020   MCV 81.3 09/07/2020   PLT 132 (L) 09/07/2020   NEUTROABS PENDING 09/07/2020    ASSESSMENT & PLAN:  Malignant neoplasm of upper-outer quadrant of right breast in female, estrogen receptor positive ([place]) 07/28/2020:Patient palpated a right breast mass for 1-2 years. Mammogram showed a 2.2cm mass at the 11 o'clock position with surrounding calcifications, 6.4cm in total extent, and up to 5 abnormal right axillary lymph nodes. Biopsy showed invasive and in situ ductal carcinoma in the breast and axilla, grade 2, HER-2 equivocal by IHC (2+), negative by FISH (ratio 1.6), ER+ 50% weak, PR+ 20%, Ki67  20%.  Treatment plan: 1.Neoadjuvantchemotherapy (MammaPrint testHigh Risk): AC foll by Taxol  2.mastectomy versus breast conserving surgery with targeted node dissection 3.Adjuvant radiation therapy 4.Follow-up adjuvant antiestrogen therapy [facility] 16070: Treatment of refractory nausea ------------------------------------------------------------------------------------------------------------------------------- Current Treatment: Cycle 1 day 8 Adriamycin and Cytoxan  Chemo toxicities: 1.  Nausea and vomiting: Patient is on the clinical trial and will be randomized. 2. cloudiness in the head: I discussed with her that it will improve with time. 3.  Cytopenias: I reduce the dosage of Adriamycin and Cytoxan for the next treatment 4.  Severe fatigue 5.  Intermittent headaches  Return to clinic in 1 week for cycle 2    No orders of the defined types were  placed in this encounter.  The patient has a good understanding of the overall plan. she agrees with it. she will call with any problems that may develop before the next visit here.  Total time spent: 30 mins including face to face time and time spent for planning, charting and coordination of care  , , MD 09/07/2020  I, Molly Dorshimer, am acting as scribe for Dr.  .  I have reviewed the above documentation for accuracy and completeness, and I agree with the above.       

## 2020-09-07 ENCOUNTER — Encounter: Payer: Self-pay | Admitting: *Deleted

## 2020-09-07 ENCOUNTER — Inpatient Hospital Stay: Payer: Medicaid Other

## 2020-09-07 ENCOUNTER — Encounter: Payer: Self-pay | Admitting: Licensed Clinical Social Worker

## 2020-09-07 ENCOUNTER — Inpatient Hospital Stay (HOSPITAL_BASED_OUTPATIENT_CLINIC_OR_DEPARTMENT_OTHER): Payer: Medicaid Other | Admitting: Hematology and Oncology

## 2020-09-07 ENCOUNTER — Other Ambulatory Visit: Payer: Self-pay

## 2020-09-07 ENCOUNTER — Encounter: Payer: Self-pay | Admitting: Emergency Medicine

## 2020-09-07 DIAGNOSIS — Z170 Estrogen receptor positive status [ER+]: Secondary | ICD-10-CM

## 2020-09-07 DIAGNOSIS — C50411 Malignant neoplasm of upper-outer quadrant of right female breast: Secondary | ICD-10-CM

## 2020-09-07 DIAGNOSIS — D500 Iron deficiency anemia secondary to blood loss (chronic): Secondary | ICD-10-CM

## 2020-09-07 DIAGNOSIS — Z5111 Encounter for antineoplastic chemotherapy: Secondary | ICD-10-CM | POA: Diagnosis not present

## 2020-09-07 LAB — CMP (CANCER CENTER ONLY)
ALT: 11 U/L (ref 0–44)
AST: 12 U/L — ABNORMAL LOW (ref 15–41)
Albumin: 3.8 g/dL (ref 3.5–5.0)
Alkaline Phosphatase: 59 U/L (ref 38–126)
Anion gap: 8 (ref 5–15)
BUN: 9 mg/dL (ref 6–20)
CO2: 22 mmol/L (ref 22–32)
Calcium: 8.5 mg/dL — ABNORMAL LOW (ref 8.9–10.3)
Chloride: 108 mmol/L (ref 98–111)
Creatinine: 0.66 mg/dL (ref 0.44–1.00)
GFR, Estimated: >60 mL/min (ref >60)
Glucose, Bld: 109 mg/dL — ABNORMAL HIGH (ref 70–99)
Potassium: 3.4 mmol/L — ABNORMAL LOW (ref 3.5–5.1)
Sodium: 138 mmol/L (ref 135–145)
Total Bilirubin: 0.5 mg/dL (ref 0.3–1.2)
Total Protein: 7 g/dL (ref 6.5–8.1)

## 2020-09-07 LAB — CBC WITH DIFFERENTIAL (CANCER CENTER ONLY)
Abs Immature Granulocytes: 0.11 10*3/uL — ABNORMAL HIGH (ref 0.00–0.07)
Basophils Absolute: 0 10*3/uL (ref 0.0–0.1)
Basophils Relative: 2 %
Eosinophils Absolute: 0 10*3/uL (ref 0.0–0.5)
Eosinophils Relative: 2 %
HCT: 35.6 % — ABNORMAL LOW (ref 36.0–46.0)
Hemoglobin: 11.3 g/dL — ABNORMAL LOW (ref 12.0–15.0)
Immature Granulocytes: 6 %
Lymphocytes Relative: 57 %
Lymphs Abs: 1.1 10*3/uL (ref 0.7–4.0)
MCH: 25.8 pg — ABNORMAL LOW (ref 26.0–34.0)
MCHC: 31.7 g/dL (ref 30.0–36.0)
MCV: 81.3 fL (ref 80.0–100.0)
Monocytes Absolute: 0 10*3/uL — ABNORMAL LOW (ref 0.1–1.0)
Monocytes Relative: 2 %
Neutro Abs: 0.6 10*3/uL — ABNORMAL LOW (ref 1.7–7.7)
Neutrophils Relative %: 31 %
Platelet Count: 132 10*3/uL — ABNORMAL LOW (ref 150–400)
RBC: 4.38 MIL/uL (ref 3.87–5.11)
RDW: 20.1 % — ABNORMAL HIGH (ref 11.5–15.5)
WBC Count: 1.9 10*3/uL — ABNORMAL LOW (ref 4.0–10.5)
nRBC: 0 % (ref 0.0–0.2)

## 2020-09-07 MED ORDER — SODIUM CHLORIDE 0.9% FLUSH
10.0000 mL | Freq: Once | INTRAVENOUS | Status: AC | PRN
  Administered 2020-09-07: 10 mL
  Filled 2020-09-07: qty 10

## 2020-09-07 MED ORDER — HEPARIN SOD (PORK) LOCK FLUSH 100 UNIT/ML IV SOLN
250.0000 [IU] | Freq: Once | INTRAVENOUS | Status: AC | PRN
  Administered 2020-09-07: 250 [IU]
  Filled 2020-09-07: qty 5

## 2020-09-07 NOTE — Progress Notes (Signed)
[facility] CSW Progress Note  Holiday representative met with patient to update information for [employer] application. Pt is working on downloading and sending copies of requested documents. She did get her driver's license updated with her current address. CSW scanned & sent it per pt's request.  Pt having side effects from chemo, especially with taste, which is difficult for her. She has not been able to work much because of this and hopes to be feeling better by the end of the week. Her son is in [place] for the week which is letting her rest more.  CSW will see patient in infusion 10/16/19. Pt then getting port placed 1/29    Christeen Douglas , LCSW

## 2020-09-07 NOTE — Patient Instructions (Signed)

## 2020-09-07 NOTE — Research (Signed)
[id number] - TREATMENT OF REFRACTORY NAUSEA  09/07/20   Home Record: Melody Rodriguez presented to the clinic for her lab appointment and toxicity visit with Dr. Lindi Adie.  She brought her 4 day home record but it had not been completed yet.  She completed the diary in the clinic based off of her memory.  Assistance was required to fill out the medication section of the diary.  Her medication list was reviewed and updated.   Eligibility:  Patient meets all eligibility criteria for the cycle 2 portion of the study.  She agreed to continue with part 2 of the study. Second eligibility review was performed by Foye Spurling, RN, Clinical Research Nurse.  Randomization:  The patient was randomized to a treatment arm in [employer].  Study medication was ordered.  Plan: The patient is scheduled for cycle 2 of chemo on 09/14/2020.    The patient did not have any questions at this time.  I encouraged her to call with any questions.  Patient was thanked for her time and participation.  Clabe Seal Clinical Research Coordinator I  09/07/20 3:40 PM

## 2020-09-07 NOTE — Assessment & Plan Note (Signed)
07/28/2020:Patient palpated a right breast mass for 1-2 years. Mammogram showed a 2.2cm mass at the 11 o'clock position with surrounding calcifications, 6.4cm in total extent, and up to 5 abnormal right axillary lymph nodes. 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  Treatment plan: 1.Neoadjuvantchemotherapy (MammaPrint testHigh Risk): AC foll by Taxol  2.mastectomy versus breast conserving surgery with targeted node dissection 3.Adjuvant radiation therapy 4.Follow-up adjuvant antiestrogen therapy [facility] 16070: Treatment of refractory nausea ------------------------------------------------------------------------------------------------------------------------------- Current Treatment: Cycle 1 day 8 Adriamycin and Cytoxan  Chemo toxicities:  Return to clinic in 1 week for cycle 2

## 2020-09-09 ENCOUNTER — Inpatient Hospital Stay: Payer: Medicaid Other

## 2020-09-09 ENCOUNTER — Telehealth: Payer: Self-pay | Admitting: Hematology and Oncology

## 2020-09-09 DIAGNOSIS — Z5111 Encounter for antineoplastic chemotherapy: Secondary | ICD-10-CM | POA: Diagnosis not present

## 2020-09-09 DIAGNOSIS — Z452 Encounter for adjustment and management of vascular access device: Secondary | ICD-10-CM | POA: Insufficient documentation

## 2020-09-09 DIAGNOSIS — D500 Iron deficiency anemia secondary to blood loss (chronic): Secondary | ICD-10-CM

## 2020-09-09 MED ORDER — HEPARIN SOD (PORK) LOCK FLUSH 100 UNIT/ML IV SOLN
250.0000 [IU] | Freq: Once | INTRAVENOUS | Status: AC
  Administered 2020-09-09: 250 [IU]
  Filled 2020-09-09: qty 5

## 2020-09-09 MED ORDER — SODIUM CHLORIDE 0.9% FLUSH
10.0000 mL | Freq: Once | INTRAVENOUS | Status: AC
  Administered 2020-09-09: 10 mL
  Filled 2020-09-09: qty 10

## 2020-09-09 NOTE — Telephone Encounter (Signed)
No 12/21 los, no changes made to pt schedule  

## 2020-09-11 ENCOUNTER — Encounter (HOSPITAL_COMMUNITY): Payer: Self-pay

## 2020-09-11 ENCOUNTER — Other Ambulatory Visit: Payer: Self-pay

## 2020-09-11 DIAGNOSIS — Z853 Personal history of malignant neoplasm of breast: Secondary | ICD-10-CM | POA: Insufficient documentation

## 2020-09-11 DIAGNOSIS — F17290 Nicotine dependence, other tobacco product, uncomplicated: Secondary | ICD-10-CM | POA: Insufficient documentation

## 2020-09-11 DIAGNOSIS — M791 Myalgia, unspecified site: Secondary | ICD-10-CM | POA: Diagnosis present

## 2020-09-11 DIAGNOSIS — Z91040 Latex allergy status: Secondary | ICD-10-CM | POA: Diagnosis not present

## 2020-09-11 DIAGNOSIS — Z20822 Contact with and (suspected) exposure to covid-19: Secondary | ICD-10-CM | POA: Insufficient documentation

## 2020-09-11 DIAGNOSIS — F1590 Other stimulant use, unspecified, uncomplicated: Secondary | ICD-10-CM | POA: Diagnosis not present

## 2020-09-11 DIAGNOSIS — R079 Chest pain, unspecified: Secondary | ICD-10-CM | POA: Insufficient documentation

## 2020-09-11 DIAGNOSIS — R6883 Chills (without fever): Secondary | ICD-10-CM | POA: Diagnosis not present

## 2020-09-11 NOTE — ED Triage Notes (Signed)
Pt presents with c/o chills and body aches. Pt reports she was exposed to her soon who tested positive for covid. Pt is fully vaccinated against Covid, has not had a booster shot yet. Pt is a cancer pt, last chemo on 12/17.

## 2020-09-12 ENCOUNTER — Emergency Department (HOSPITAL_COMMUNITY)
Admission: EM | Admit: 2020-09-12 | Discharge: 2020-09-12 | Disposition: A | Discharge status: Home / Self Care | Payer: Medicaid Other | Attending: Emergency Medicine | Admitting: Emergency Medicine

## 2020-09-12 ENCOUNTER — Emergency Department (HOSPITAL_COMMUNITY): Payer: Medicaid Other

## 2020-09-12 DIAGNOSIS — M791 Myalgia, unspecified site: Secondary | ICD-10-CM

## 2020-09-12 LAB — COMPREHENSIVE METABOLIC PANEL
ALT: 16 U/L (ref 0–44)
AST: 14 U/L — ABNORMAL LOW (ref 15–41)
Albumin: 3.4 g/dL — ABNORMAL LOW (ref 3.5–5.0)
Alkaline Phosphatase: 44 U/L (ref 38–126)
Anion gap: 9 (ref 5–15)
BUN: 10 mg/dL (ref 6–20)
CO2: 24 mmol/L (ref 22–32)
Calcium: 8.7 mg/dL — ABNORMAL LOW (ref 8.9–10.3)
Chloride: 106 mmol/L (ref 98–111)
Creatinine, Ser: 0.64 mg/dL (ref 0.44–1.00)
GFR, Estimated: >60 mL/min (ref >60)
Glucose, Bld: 117 mg/dL — ABNORMAL HIGH (ref 70–99)
Potassium: 3.5 mmol/L (ref 3.5–5.1)
Sodium: 139 mmol/L (ref 135–145)
Total Bilirubin: 0.2 mg/dL — ABNORMAL LOW (ref 0.3–1.2)
Total Protein: 6 g/dL — ABNORMAL LOW (ref 6.5–8.1)

## 2020-09-12 LAB — URINALYSIS, ROUTINE W REFLEX MICROSCOPIC
Bilirubin Urine: NEGATIVE
Glucose, UA: NEGATIVE mg/dL
Hgb urine dipstick: NEGATIVE
Ketones, ur: NEGATIVE mg/dL
Leukocytes,Ua: NEGATIVE
Nitrite: NEGATIVE
Protein, ur: NEGATIVE mg/dL
Specific Gravity, Urine: 1.03 (ref 1.005–1.030)
pH: 5 (ref 5.0–8.0)

## 2020-09-12 LAB — CBC WITH DIFFERENTIAL/PLATELET
Abs Immature Granulocytes: 0.98 10*3/uL — ABNORMAL HIGH (ref 0.00–0.07)
Basophils Absolute: 0 10*3/uL (ref 0.0–0.1)
Basophils Relative: 0 %
Eosinophils Absolute: 0 10*3/uL (ref 0.0–0.5)
Eosinophils Relative: 0 %
HCT: 32.1 % — ABNORMAL LOW (ref 36.0–46.0)
Hemoglobin: 9.8 g/dL — ABNORMAL LOW (ref 12.0–15.0)
Immature Granulocytes: 9 %
Lymphocytes Relative: 21 %
Lymphs Abs: 2.4 10*3/uL (ref 0.7–4.0)
MCH: 25.7 pg — ABNORMAL LOW (ref 26.0–34.0)
MCHC: 30.5 g/dL (ref 30.0–36.0)
MCV: 84.3 fL (ref 80.0–100.0)
Monocytes Absolute: 1 10*3/uL (ref 0.1–1.0)
Monocytes Relative: 9 %
Neutro Abs: 6.8 10*3/uL (ref 1.7–7.7)
Neutrophils Relative %: 61 %
Platelets: 116 10*3/uL — ABNORMAL LOW (ref 150–400)
RBC: 3.81 MIL/uL — ABNORMAL LOW (ref 3.87–5.11)
RDW: 21.3 % — ABNORMAL HIGH (ref 11.5–15.5)
WBC Morphology: INCREASED
WBC: 11.3 10*3/uL — ABNORMAL HIGH (ref 4.0–10.5)
nRBC: 0.3 % — ABNORMAL HIGH (ref 0.0–0.2)

## 2020-09-12 LAB — CK: Total CK: 20 U/L — ABNORMAL LOW (ref 38–234)

## 2020-09-12 LAB — RESP PANEL BY RT-PCR (FLU A&B, COVID) ARPGX2
Influenza A by PCR: NEGATIVE
Influenza B by PCR: NEGATIVE
SARS Coronavirus 2 by RT PCR: NEGATIVE

## 2020-09-12 LAB — MAGNESIUM: Magnesium: 1.6 mg/dL — ABNORMAL LOW (ref 1.7–2.4)

## 2020-09-12 LAB — TROPONIN I (HIGH SENSITIVITY)
Troponin I (High Sensitivity): 6 ng/L (ref <18)
Troponin I (High Sensitivity): 9 ng/L (ref <18)

## 2020-09-12 MED ORDER — FENTANYL CITRATE (PF) 100 MCG/2ML IJ SOLN
100.0000 ug | Freq: Once | INTRAMUSCULAR | Status: AC
Start: 2020-09-12 — End: 2020-09-12
  Administered 2020-09-12: 100 ug via INTRAVENOUS
  Filled 2020-09-12: qty 2

## 2020-09-12 MED ORDER — SODIUM CHLORIDE 0.9 % IV BOLUS
1000.0000 mL | Freq: Once | INTRAVENOUS | Status: AC
  Administered 2020-09-12: 1000 mL via INTRAVENOUS

## 2020-09-12 MED ORDER — HYDROCODONE-ACETAMINOPHEN 5-325 MG PO TABS
1.0000 | ORAL_TABLET | Freq: Four times a day (QID) | ORAL | 0 refills | Status: DC | PRN
Start: 2020-09-12 — End: 2020-09-12

## 2020-09-12 MED ORDER — HYDROCODONE-ACETAMINOPHEN 5-325 MG PO TABS: 1.0000 | ORAL_TABLET | Freq: Four times a day (QID) | ORAL | 0 refills | Status: DC | PRN

## 2020-09-12 MED ORDER — ONDANSETRON HCL 4 MG/2ML IJ SOLN
4.0000 mg | Freq: Once | INTRAMUSCULAR | Status: AC
  Administered 2020-09-12: 4 mg via INTRAVENOUS
  Filled 2020-09-12: qty 2

## 2020-09-12 NOTE — ED Provider Notes (Signed)
[facility] DEPT Provider Note: Georgena Spurling, MD, FACEP  CSN: [id number] MRN: [id number] ARRIVAL: 09/11/20 at [place]: [id number]   CHIEF COMPLAINT  Generalized Body Aches   HISTORY OF PRESENT ILLNESS  09/12/20 12:26 AM Melody Rodriguez is a 44 y.o. female with breast cancer undergoing chemotherapy.  She is here with 2 days of generalized body aches, which she describes as feeling like muscle cramps.  She rates his pain is a 10 out of 10.  She is also having chest pain which she describes as a pressure.  She has taken a muscle relaxant without improvement in her pain.  She has had generalized weakness and chills but is not aware of having a fever (her temperature on arrival was 99.2).  She denies nasal congestion, sore throat, cough, shortness of breath, nausea, vomiting or diarrhea.  She has had a recent Covid exposure.   Past Medical History:  Diagnosis Date  . Breast cancer ([place])   . Family history of colon cancer     Past Surgical History:  Procedure Laterality Date  . BREAST CYST EXCISION Right    Patient does not recall (2014 or 2015)  . CESAREAN SECTION    . THYROIDECTOMY, PARTIAL      Family History  Problem Relation Age of Onset  . Hypertension Mother   . Colon cancer Sister 37  . Diabetes Maternal Grandmother   . Cancer Maternal Aunt        unknown type dx late 23s    Social History   Tobacco Use  . Smoking status: Current Every Day Smoker    Packs/day: 1.00    Types: Cigars  . Smokeless tobacco: Never Used  Vaping Use  . Vaping Use: Never used  Substance Use Topics  . Alcohol use: Yes  . Drug use: Yes    Types: Marijuana    Prior to Admission medications   Medication Sig Start Date End Date Taking? Authorizing Provider  cyclobenzaprine (FLEXERIL) 5 MG tablet Take 1 tablet (5 mg total) by mouth 3 (three) times daily as needed for muscle spasms. 08/17/20   Nicholas Lose, MD  dexamethasone (DECADRON) 4 MG tablet Take 1 tablet (4 mg total) by mouth  daily. Take 1 tablet day after chemo and 1 tablet 2 days after chemo with food 08/19/20   Nicholas Lose, MD  HYDROcodone-acetaminophen (NORCO) 5-325 MG tablet Take 1 tablet by mouth every 6 (six) hours as needed (for pain). 09/12/20   Camrynn Mcclintic, MD  lidocaine-prilocaine (EMLA) cream Apply to affected area once 08/19/20   Nicholas Lose, MD  loratadine (CLARITIN) 10 MG tablet Take 10 mg by mouth daily. 09/01/20   [provider]  ondansetron (ZOFRAN) 8 MG tablet Take 1 tablet (8 mg total) by mouth 2 (two) times daily as needed. Start on the third day after chemotherapy. 08/19/20   Nicholas Lose, MD    Allergies Latex   REVIEW OF SYSTEMS  Negative except as noted here or in the History of Present Illness.   PHYSICAL EXAMINATION  Initial Vital Signs Blood pressure 139/66, pulse 81, temperature 99.2 F (37.3 C), temperature source Oral, resp. rate 18, last menstrual period 09/03/2020, SpO2 97 %.  Examination General: Well-developed, well-nourished female in no acute distress; appearance consistent with age of record HENT: normocephalic; atraumatic Eyes: pupils equal, round and reactive to light; extraocular muscles intact Neck: supple Heart: regular rate and rhythm Lungs: clear to auscultation bilaterally Abdomen: soft; nondistended; nontender; bowel sounds present Extremities: No deformity; full range  of motion; pulses normal; generalized muscle tenderness Neurologic: Awake, alert and oriented; motor function intact in all extremities and symmetric; no facial droop Skin: Warm and dry Psychiatric: Flat affect   RESULTS  Summary of this visit's results, reviewed and interpreted by myself:   EKG Interpretation  Date/Time:  Sunday September 12 2020 00:57:35 EST Ventricular Rate:  71 PR Interval:    QRS Duration: 113 QT Interval:  421 QTC Calculation: 458 R Axis:   17 Text Interpretation: Sinus rhythm Borderline intraventricular conduction delay Abnormal R-wave  progression, early transition No previous ECGs available Confirmed by Shanon Rosser [phone]) on 09/12/2020 1:06:14 AM      Laboratory Studies: Results for orders placed or performed during the hospital encounter of 09/12/20 (from the past 24 hour(s))  Urinalysis, Routine w reflex microscopic Urine, Clean Catch     Status: None   Collection Time: 09/12/20 12:37 AM  Result Value Ref Range   Color, Urine YELLOW YELLOW   APPearance CLEAR CLEAR   Specific Gravity, Urine 1.030 1.005 - 1.030   pH 5.0 5.0 - 8.0   Glucose, UA NEGATIVE NEGATIVE mg/dL   Hgb urine dipstick NEGATIVE NEGATIVE   Bilirubin Urine NEGATIVE NEGATIVE   Ketones, ur NEGATIVE NEGATIVE mg/dL   Protein, ur NEGATIVE NEGATIVE mg/dL   Nitrite NEGATIVE NEGATIVE   Leukocytes,Ua NEGATIVE NEGATIVE  CBC with Differential/Platelet     Status: Abnormal   Collection Time: 09/12/20 12:56 AM  Result Value Ref Range   WBC 11.3 (H) 4.0 - 10.5 K/uL   RBC 3.81 (L) 3.87 - 5.11 MIL/uL   Hemoglobin 9.8 (L) 12.0 - 15.0 g/dL   HCT 32.1 (L) 36.0 - 46.0 %   MCV 84.3 80.0 - 100.0 fL   MCH 25.7 (L) 26.0 - 34.0 pg   MCHC 30.5 30.0 - 36.0 g/dL   RDW 21.3 (H) 11.5 - 15.5 %   Platelets 116 (L) 150 - 400 K/uL   nRBC 0.3 (H) 0.0 - 0.2 %   Neutrophils Relative % 61 %   Neutro Abs 6.8 1.7 - 7.7 K/uL   Lymphocytes Relative 21 %   Lymphs Abs 2.4 0.7 - 4.0 K/uL   Monocytes Relative 9 %   Monocytes Absolute 1.0 0.1 - 1.0 K/uL   Eosinophils Relative 0 %   Eosinophils Absolute 0.0 0.0 - 0.5 K/uL   Basophils Relative 0 %   Basophils Absolute 0.0 0.0 - 0.1 K/uL   WBC Morphology INCREASED BANDS (>20% BANDS)    Immature Granulocytes 9 %   Abs Immature Granulocytes 0.98 (H) 0.00 - 0.07 K/uL  Comprehensive metabolic panel     Status: Abnormal   Collection Time: 09/12/20 12:56 AM  Result Value Ref Range   Sodium 139 135 - 145 mmol/L   Potassium 3.5 3.5 - 5.1 mmol/L   Chloride 106 98 - 111 mmol/L   CO2 24 22 - 32 mmol/L   Glucose, Bld 117 (H) 70 - 99  mg/dL   BUN 10 6 - 20 mg/dL   Creatinine, Ser 0.64 0.44 - 1.00 mg/dL   Calcium 8.7 (L) 8.9 - 10.3 mg/dL   Total Protein 6.0 (L) 6.5 - 8.1 g/dL   Albumin 3.4 (L) 3.5 - 5.0 g/dL   AST 14 (L) 15 - 41 U/L   ALT 16 0 - 44 U/L   Alkaline Phosphatase 44 38 - 126 U/L   Total Bilirubin 0.2 (L) 0.3 - 1.2 mg/dL   GFR, Estimated >60 >60 mL/min   Anion gap 9  5 - 15  Magnesium     Status: Abnormal   Collection Time: 09/12/20 12:56 AM  Result Value Ref Range   Magnesium 1.6 (L) 1.7 - 2.4 mg/dL  Troponin I (High Sensitivity)     Status: None   Collection Time: 09/12/20 12:56 AM  Result Value Ref Range   Troponin I (High Sensitivity) 6 <18 ng/L  CK     Status: Abnormal   Collection Time: 09/12/20 12:56 AM  Result Value Ref Range   Total CK 20 (L) 38 - 234 U/L  Resp Panel by RT-PCR (Flu A&B, Covid) Nasopharyngeal Swab     Status: None   Collection Time: 09/12/20 12:56 AM   Specimen: Nasopharyngeal Swab; Nasopharyngeal(NP) swabs in vial transport medium  Result Value Ref Range   SARS Coronavirus 2 by RT PCR NEGATIVE NEGATIVE   Influenza A by PCR NEGATIVE NEGATIVE   Influenza B by PCR NEGATIVE NEGATIVE  Troponin I (High Sensitivity)     Status: None   Collection Time: 09/12/20  3:28 AM  Result Value Ref Range   Troponin I (High Sensitivity) 9 <18 ng/L   Imaging Studies: DG Chest 2 View  Result Date: 09/12/2020 CLINICAL DATA:  Chest pain. Chills and body aches. COVID-19 exposure. EXAM: CHEST - 2 VIEW COMPARISON:  None. FINDINGS: A right PICC terminates over the upper SVC. The cardiomediastinal silhouette is within normal limits. No airspace consolidation, edema, pleural effusion, or pneumothorax is identified. No acute osseous abnormality is seen. IMPRESSION: No active cardiopulmonary disease. Electronically Signed   By: Logan Bores M.D.   On: 09/12/2020 01:23    ED COURSE and MDM  Nursing notes, initial and subsequent vitals signs, including pulse oximetry, reviewed and interpreted by  myself.  Vitals:   09/12/20 0245 09/12/20 0300 09/12/20 0320 09/12/20 0330  BP: 118/64 119/66 122/65 116/65  Pulse: 69 72 76 67  Resp:      Temp:      TempSrc:      SpO2: 93% 96% 99% 95%   Medications  ondansetron (ZOFRAN) injection 4 mg (4 mg Intravenous Given 09/12/20 0104)  fentaNYL (SUBLIMAZE) injection 100 mcg (100 mcg Intravenous Given 09/12/20 0104)  sodium chloride 0.9 % bolus 1,000 mL (1,000 mLs Intravenous New Bag/Given 09/12/20 0327)   3:44 AM The patient's pain is improved with IV fentanyl.  Her urine shows no urinary tract infection.  Her chest x-ray shows no pneumonia.  Her Covid and influenza tests are negative.  She does not meet sepsis criteria and continues to have vital signs within normal limits.  The cause of her myalgias is unclear but could represent a viral illness.  4:25 AM Patient able to ambulate without difficulty.  Her vital signs remained normal.  I see no indication for admission at this time.  Blood cultures were obtained as a precaution.  She was advised to return if symptoms worsen.   PROCEDURES  Procedures   ED DIAGNOSES     ICD-10-CM   1. Myalgia  M79.10        Shanecia Hoganson, Jenny Reichmann, MD 09/12/20 0430

## 2020-09-12 NOTE — ED Notes (Signed)
Pt. Ambulated to bathroom without need for assistance. Pt. Gait steady on her feet.

## 2020-09-13 ENCOUNTER — Other Ambulatory Visit: Payer: Self-pay | Admitting: *Deleted

## 2020-09-13 ENCOUNTER — Other Ambulatory Visit: Payer: Self-pay | Admitting: Radiology

## 2020-09-13 NOTE — Progress Notes (Signed)
[facility] Follow up:    Patient, [name] No address on file   DIAGNOSIS: Cancer Staging Malignant neoplasm of upper-outer quadrant of right breast in female, estrogen receptor positive ([place]) Staging form: Breast, AJCC 8th Edition - Clinical stage from 08/04/2020: Stage IIA (cT2, cN1, cM0, G2, ER+, PR+, HER2-) - Unsigned   SUMMARY OF ONCOLOGIC HISTORY: Oncology History  Malignant neoplasm of upper-outer quadrant of right breast in female, estrogen receptor positive ([place])  07/28/2020 Initial Diagnosis   Patient palpated a right breast mass for 1-2 years. Mammogram showed a 2.2cm mass at the 11 o'clock position with surrounding calcifications, 6.4cm in total extent, and up to 5 abnormal right axillary lymph nodes. 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    08/05/2020 Miscellaneous   MammaPrint: High risk luminal type B   08/11/2020 Genetic Testing   Negative genetic testing: no pathogenic variants detected in [facility] Breast Cancer STAT Panel or Common Hereditary Cancers panel. The report dates are August 11, 2020 and August 19, 2020, respectively. Two variants of uncertain signficance were detected - one in the CTNNA1 gene called c.86del and the second in the MLH1 gene called c.808A>G.   The STAT Breast cancer panel offered by [facility] includes sequencing and rearrangement analysis for the following 9 genes:  ATM, BRCA1, BRCA2, CDH1, CHEK2, PALB2, PTEN, STK11 and TP53.  The Common Hereditary Cancers Panel offered by [facility] includes sequencing and/or deletion duplication testing of the following 48 genes: APC, ATM, AXIN2, BARD1, BMPR1A, BRCA1, BRCA2, BRIP1, CDH1, CDK4, CDKN2A (p14ARF), CDKN2A (p16INK4a), CHEK2, CTNNA1, DICER1, EPCAM (Deletion/duplication testing only), GREM1 (promoter region deletion/duplication testing only),  KIT, MEN1, MLH1, MSH2, MSH3, MSH6, MUTYH, NBN, NF1, NTHL1, PALB2, PDGFRA, PMS2, POLD1, POLE, PTEN, RAD50, RAD51C, RAD51D, RNF43, SDHB, SDHC, SDHD, SMAD4, SMARCA4. STK11, TP53, TSC1, TSC2, and VHL.  The following genes were evaluated for sequence changes only: SDHA and HOXB13 c.251G>A variant only.    09/01/2020 -  Chemotherapy   The patient had dexamethasone (DECADRON) 4 MG tablet, 4 mg (100 % of original dose 4 mg), Oral, Daily, 1 of 1 cycle, Start date: 08/19/2020, End date: -- Dose modification: 4 mg (original dose 4 mg, Cycle 0) DOXOrubicin (ADRIAMYCIN) chemo injection 122 mg, 60 mg/m2 = 122 mg, Intravenous,  Once, 1 of 4 cycles Dose modification: 50 mg/m2 (original dose 60 mg/m2, Cycle 2, Reason: Dose not tolerated) Administration: 122 mg (09/01/2020) palonosetron (ALOXI) injection 0.25 mg, 0.25 mg, Intravenous,  Once, 1 of 8 cycles Administration: 0.25 mg (09/01/2020) pegfilgrastim-cbqv (UDENYCA) injection 6 mg, 6 mg, Subcutaneous, Once, 1 of 4 cycles Administration: 6 mg (09/03/2020) cyclophosphamide (CYTOXAN) 1,220 mg in sodium chloride 0.9 % 250 mL chemo infusion, 600 mg/m2 = 1,220 mg, Intravenous,  Once, 1 of 4 cycles Dose modification: 500 mg/m2 (original dose 600 mg/m2, Cycle 2, Reason: Dose not tolerated) Administration: 1,220 mg (09/01/2020) PACLitaxel (TAXOL) 162 mg in sodium chloride 0.9 % 250 mL chemo infusion (</= 47m/m2), 80 mg/m2 = 162 mg, Intravenous,  Once, 0 of 4 cycles fosaprepitant (EMEND) 150 mg in sodium chloride 0.9 % 145 mL IVPB, 150 mg, Intravenous,  Once, 1 of 8 cycles Administration: 150 mg (09/01/2020)  for chemotherapy treatment.      CURRENT THERAPY:Adriamycin/Cytoxan cycle 2  INTERVAL HISTORY: Melody Harkless89y.o. female returns for evaluation of cycle 2 of Adriamycin, Cytoxan.  She had refractory nausea and vomiting following cycle 1.  She has enrolled in the [id number] nausea study.  She had significant issues with nausea, dehydration, and subsequent bony  pain after receiving treatment requiring an ER visit.  She underwent full work up, including a COVID test which was negative.    She still has mild pain, but otherwise is feeling moderately well without fever, chills, or any other concerns.  She is fatigued.   Patient Active Problem List   Diagnosis Date Noted  . PICC (peripherally inserted central catheter) in place 09/09/2020  . Genetic testing 08/11/2020  . Iron deficiency anemia due to chronic blood loss 08/04/2020  . Family history of colon cancer   . Malignant neoplasm of upper-outer quadrant of right breast in female, estrogen receptor positive ([place]) 07/28/2020    is allergic to latex.  MEDICAL HISTORY: Past Medical History:  Diagnosis Date  . Breast cancer ([place])   . Family history of colon cancer     SURGICAL HISTORY: Past Surgical History:  Procedure Laterality Date  . BREAST CYST EXCISION Right    Patient does not recall (2014 or 2015)  . CESAREAN SECTION    . THYROIDECTOMY, PARTIAL      SOCIAL HISTORY: Social History   Socioeconomic History  . Marital status: Single    Spouse name: Not on file  . Number of children: 2  . Years of education: Not on file  . Highest education level: Some college, no degree  Occupational History  . Not on file  Tobacco Use  . Smoking status: Current Every Day Smoker    Packs/day: 1.00    Types: Cigars  . Smokeless tobacco: Never Used  Vaping Use  . Vaping Use: Never used  Substance and Sexual Activity  . Alcohol use: Yes  . Drug use: Yes    Types: Marijuana  . Sexual activity: Not Currently  Other Topics Concern  . Not on file  Social History Narrative  . Not on file   Social Determinants of Health   Financial Resource Strain: High Risk  . Difficulty of Paying Living Expenses: Hard  Food Insecurity: No Food Insecurity  . Worried About Charity fundraiser in the Last Year: Never true  . Ran Out of Food in the Last Year: Never true  Transportation Needs: No  Transportation Needs  . Lack of Transportation (Medical): No  . Lack of Transportation (Non-Medical): No  Physical Activity: Not on file  Stress: Not on file  Social Connections: Not on file  Intimate Partner Violence: Not on file    FAMILY HISTORY: Family History  Problem Relation Age of Onset  . Hypertension Mother   . Colon cancer Sister 42  . Diabetes Maternal Grandmother   . Cancer Maternal Aunt        unknown type dx late 68s    Review of Systems  Constitutional: Positive for fatigue. Negative for appetite change, chills, fever and unexpected weight change.  HENT:   Negative for hearing loss, lump/mass and trouble swallowing.   Eyes: Negative for eye problems and icterus.  Respiratory: Negative for chest tightness, cough and shortness of breath.   Cardiovascular: Negative for chest pain, leg swelling and palpitations.  Gastrointestinal: Negative for abdominal distention, abdominal pain, constipation, diarrhea, nausea and vomiting.  Endocrine: Negative for hot flashes.  Genitourinary: Negative for difficulty urinating.   Musculoskeletal: Negative for arthralgias.  Skin: Negative for itching and rash.  Neurological: Negative for dizziness, extremity weakness, headaches and numbness.  Hematological: Negative for adenopathy. Does not bruise/bleed easily.  Psychiatric/Behavioral: Negative for depression. The patient is not nervous/anxious.       PHYSICAL EXAMINATION  ECOG PERFORMANCE STATUS: 2 - Symptomatic, <50% confined to bed  Vitals:   09/14/20 0837  BP: 122/74  Pulse: 78  Resp: 18  Temp: 98.1 F (36.7 C)  SpO2: 99%    Physical Exam Constitutional:      General: She is not in acute distress.    Appearance: Normal appearance. She is not toxic-appearing.  HENT:     Head: Normocephalic and atraumatic.  Eyes:     General: No scleral icterus. Cardiovascular:     Rate and Rhythm: Normal rate and regular rhythm.     Pulses: Normal pulses.     Heart sounds:  Normal heart sounds.  Pulmonary:     Effort: Pulmonary effort is normal.     Breath sounds: Normal breath sounds.     Comments: Softer right breast mass, likely early clinical indication of response, no progression noted Abdominal:     General: Abdomen is flat. Bowel sounds are normal. There is no distension.     Palpations: Abdomen is soft.     Tenderness: There is no abdominal tenderness.  Musculoskeletal:        General: No swelling.     Cervical back: Neck supple.  Lymphadenopathy:     Cervical: No cervical adenopathy.  Skin:    General: Skin is warm and dry.     Findings: No rash.  Neurological:     General: No focal deficit present.     Mental Status: She is alert.  Psychiatric:        Mood and Affect: Mood normal.        Behavior: Behavior normal.     LABORATORY DATA:  CBC    Component Value Date/Time   WBC 16.5 (H) 09/14/2020 0822   WBC 11.3 (H) 09/12/2020 0056   RBC 3.85 (L) 09/14/2020 0822   HGB 9.9 (L) 09/14/2020 0822   HCT 32.4 (L) 09/14/2020 0822   PLT 127 (L) 09/14/2020 0822   MCV 84.2 09/14/2020 0822   MCH 25.7 (L) 09/14/2020 0822   MCHC 30.6 09/14/2020 0822   RDW 21.4 (H) 09/14/2020 0822   LYMPHSABS 1.8 09/14/2020 0822   MONOABS 1.1 (H) 09/14/2020 0822   EOSABS 0.0 09/14/2020 0822   BASOSABS 0.1 09/14/2020 0822    CMP     Component Value Date/Time   NA 141 09/14/2020 0822   K 4.0 09/14/2020 0822   CL 110 09/14/2020 0822   CO2 25 09/14/2020 0822   GLUCOSE 133 (H) 09/14/2020 0822   BUN 9 09/14/2020 0822   CREATININE 0.66 09/14/2020 0822   CALCIUM 8.7 (L) 09/14/2020 0822   PROT 6.4 (L) 09/14/2020 0822   ALBUMIN 3.5 09/14/2020 0822   AST 14 (L) 09/14/2020 0822   ALT 15 09/14/2020 0822   ALKPHOS 63 09/14/2020 0822   BILITOT <0.2 (L) 09/14/2020 0822   GFRNONAA >60 09/14/2020 0822   GFRAA >60 01/12/2020 0759        RADIOGRAPHIC STUDIES:  No results found.       ASSESSMENT and PLAN:   Malignant neoplasm of upper-outer quadrant  of right breast in female, estrogen receptor positive ([place]) 07/28/2020:Patient palpated a right breast mass for 1-2 years. Mammogram showed a 2.2cm mass at the 11 o'clock position with surrounding calcifications, 6.4cm in total extent, and up to 5 abnormal right axillary lymph nodes. Biopsy showed invasive  and in situ ductal carcinoma in the breast and axilla, grade 2, HER-2 equivocal by IHC (2+), negative by FISH (ratio 1.6), ER+ 50% weak, PR+ 20%, Ki67 20%.  Treatment plan: 1.Neoadjuvantchemotherapy (MammaPrint testHigh Risk): AC foll by Taxol  2.mastectomy versus breast conserving surgery with targeted node dissection 3.Adjuvant radiation therapy 4.Follow-up adjuvant antiestrogen therapy [facility] 16070: Treatment of refractory nausea ------------------------------------------------------------------------------------------------------------------------------- Current Treatment: Cycle 2 day 1 Adriamycin and Cytoxan  Chemo toxicities: Refractory nausea/vomiting: she is enrolled in [facility] 16070 study.  She was instructed to let me know if she develops any further dehydration/inability to eat/drink.  I let her know we can get her in for IV fluids.  Bone pain: this is likely secondary to the neulasta she receives on day 3 of treatment.  I recommended she take 1 extra strength tylenol and one aleve as much as TID PRN for her bone aches.  Hopefully her pain will not be as severe with subsequent cycles.    Her breast tumor is softer, which indicates early clinical response.    I reviewed her labs today which are all within parameters for her to receive treatment.    Return to clinic in 1 week for cycle 3   All questions were answered. The patient knows to call the clinic with any problems, questions or concerns. We can certainly see the patient much sooner if necessary. This note was electronically signed.  Total encounter time: 30 minutes*  Wilber Bihari, NP 09/14/20 9:38 AM Medical  Oncology and Hematology [facility] [place], [place] [address] Tel. [phone]    Fax. [phone]  *Total Encounter Time as defined by the Centers for Medicare and Medicaid Services includes, in addition to the face-to-face time of a patient visit (documented in the note above) non-face-to-face time: obtaining and reviewing outside history, ordering and reviewing medications, tests or procedures, care coordination (communications with other health care professionals or caregivers) and documentation in the medical record.

## 2020-09-13 NOTE — Assessment & Plan Note (Addendum)
07/28/2020:Patient palpated a right breast mass for 1-2 years. Mammogram showed a 2.2cm mass at the 11 o'clock position with surrounding calcifications, 6.4cm in total extent, and up to 5 abnormal right axillary lymph nodes. 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  Treatment plan: 1.Neoadjuvantchemotherapy (MammaPrint testHigh Risk): AC foll by Taxol  2.mastectomy versus breast conserving surgery with targeted node dissection 3.Adjuvant radiation therapy 4.Follow-up adjuvant antiestrogen therapy [facility] 16070: Treatment of refractory nausea ------------------------------------------------------------------------------------------------------------------------------- Current Treatment: Cycle 2 day 1 Adriamycin and Cytoxan  Chemo toxicities: Refractory nausea/vomiting: she is enrolled in [facility] 16070 study.  She was instructed to let me know if she develops any further dehydration/inability to eat/drink.  I let her know we can get her in for IV fluids.  Bone pain: this is likely secondary to the neulasta she receives on day 3 of treatment.  I recommended she take 1 extra strength tylenol and one aleve as much as TID PRN for her bone aches.  Hopefully her pain will not be as severe with subsequent cycles.    Her breast tumor is softer, which indicates early clinical response.    I reviewed her labs today which are all within parameters for her to receive treatment.    Return to clinic in 1 week for cycle 3

## 2020-09-14 ENCOUNTER — Encounter: Payer: Self-pay | Admitting: Emergency Medicine

## 2020-09-14 ENCOUNTER — Encounter: Payer: Self-pay | Admitting: Adult Health

## 2020-09-14 ENCOUNTER — Inpatient Hospital Stay: Payer: Medicaid Other

## 2020-09-14 ENCOUNTER — Inpatient Hospital Stay (HOSPITAL_BASED_OUTPATIENT_CLINIC_OR_DEPARTMENT_OTHER): Payer: Medicaid Other | Admitting: Adult Health

## 2020-09-14 ENCOUNTER — Other Ambulatory Visit: Payer: Self-pay

## 2020-09-14 ENCOUNTER — Encounter: Payer: Self-pay | Admitting: Licensed Clinical Social Worker

## 2020-09-14 VITALS — BP 122/74 | HR 78 | Temp 98.1°F | Resp 18 | Ht 64.0 in | Wt 203.2 lb

## 2020-09-14 DIAGNOSIS — Z452 Encounter for adjustment and management of vascular access device: Secondary | ICD-10-CM

## 2020-09-14 DIAGNOSIS — Z5111 Encounter for antineoplastic chemotherapy: Secondary | ICD-10-CM | POA: Diagnosis not present

## 2020-09-14 DIAGNOSIS — Z170 Estrogen receptor positive status [ER+]: Secondary | ICD-10-CM

## 2020-09-14 DIAGNOSIS — D500 Iron deficiency anemia secondary to blood loss (chronic): Secondary | ICD-10-CM

## 2020-09-14 DIAGNOSIS — C50411 Malignant neoplasm of upper-outer quadrant of right female breast: Secondary | ICD-10-CM

## 2020-09-14 LAB — CBC WITH DIFFERENTIAL (CANCER CENTER ONLY)
Abs Immature Granulocytes: 1.82 10*3/uL — ABNORMAL HIGH (ref 0.00–0.07)
Basophils Absolute: 0.1 10*3/uL (ref 0.0–0.1)
Basophils Relative: 1 %
Eosinophils Absolute: 0 10*3/uL (ref 0.0–0.5)
Eosinophils Relative: 0 %
HCT: 32.4 % — ABNORMAL LOW (ref 36.0–46.0)
Hemoglobin: 9.9 g/dL — ABNORMAL LOW (ref 12.0–15.0)
Immature Granulocytes: 11 %
Lymphocytes Relative: 11 %
Lymphs Abs: 1.8 10*3/uL (ref 0.7–4.0)
MCH: 25.7 pg — ABNORMAL LOW (ref 26.0–34.0)
MCHC: 30.6 g/dL (ref 30.0–36.0)
MCV: 84.2 fL (ref 80.0–100.0)
Monocytes Absolute: 1.1 10*3/uL — ABNORMAL HIGH (ref 0.1–1.0)
Monocytes Relative: 7 %
Neutro Abs: 11.6 10*3/uL — ABNORMAL HIGH (ref 1.7–7.7)
Neutrophils Relative %: 70 %
Platelet Count: 127 10*3/uL — ABNORMAL LOW (ref 150–400)
RBC: 3.85 MIL/uL — ABNORMAL LOW (ref 3.87–5.11)
RDW: 21.4 % — ABNORMAL HIGH (ref 11.5–15.5)
WBC Count: 16.5 10*3/uL — ABNORMAL HIGH (ref 4.0–10.5)
nRBC: 0.4 % — ABNORMAL HIGH (ref 0.0–0.2)

## 2020-09-14 LAB — CMP (CANCER CENTER ONLY)
ALT: 15 U/L (ref 0–44)
AST: 14 U/L — ABNORMAL LOW (ref 15–41)
Albumin: 3.5 g/dL (ref 3.5–5.0)
Alkaline Phosphatase: 63 U/L (ref 38–126)
Anion gap: 6 (ref 5–15)
BUN: 9 mg/dL (ref 6–20)
CO2: 25 mmol/L (ref 22–32)
Calcium: 8.7 mg/dL — ABNORMAL LOW (ref 8.9–10.3)
Chloride: 110 mmol/L (ref 98–111)
Creatinine: 0.66 mg/dL (ref 0.44–1.00)
GFR, Estimated: >60 mL/min (ref >60)
Glucose, Bld: 133 mg/dL — ABNORMAL HIGH (ref 70–99)
Potassium: 4 mmol/L (ref 3.5–5.1)
Sodium: 141 mmol/L (ref 135–145)
Total Bilirubin: <0.2 mg/dL — ABNORMAL LOW (ref 0.3–1.2)
Total Protein: 6.4 g/dL — ABNORMAL LOW (ref 6.5–8.1)

## 2020-09-14 MED ORDER — ACETAMINOPHEN 325 MG PO TABS
ORAL_TABLET | ORAL | Status: AC
  Filled 2020-09-14: qty 2

## 2020-09-14 MED ORDER — INV-OLANZAPINE/PLACEBO 10 MG CAP URCC 16070 BLISTER CARD 2: 1.0000 | ORAL_CAPSULE | Freq: Every day | ORAL | 0 refills | Status: DC

## 2020-09-14 MED ORDER — INV-DEXAMETHASONE 4 MG PO TAB URCC 16070 BLISTER CARD 2: 2.0000 | ORAL_TABLET | Freq: Every day | ORAL | 0 refills | Status: DC

## 2020-09-14 MED ORDER — SODIUM CHLORIDE 0.9% FLUSH
10.0000 mL | INTRAVENOUS | Status: DC | PRN
  Administered 2020-09-14: 10 mL
  Filled 2020-09-14: qty 10

## 2020-09-14 MED ORDER — ACETAMINOPHEN 325 MG PO TABS
650.0000 mg | ORAL_TABLET | Freq: Once | ORAL | Status: AC
  Administered 2020-09-14: 650 mg via ORAL

## 2020-09-14 MED ORDER — SODIUM CHLORIDE 0.9% FLUSH
10.0000 mL | Freq: Once | INTRAVENOUS | Status: AC
  Administered 2020-09-14: 10 mL
  Filled 2020-09-14: qty 10

## 2020-09-14 MED ORDER — INV-PROCHLORPERAZINE MALEATE/PLACEBO 10 MG CAP URCC 16070
ORAL_TABLET | Freq: Once | ORAL | Status: AC
  Filled 2020-09-14: qty 1

## 2020-09-14 MED ORDER — SODIUM CHLORIDE 0.9 % IV SOLN
500.0000 mg/m2 | Freq: Once | INTRAVENOUS | Status: AC
  Administered 2020-09-14: 1020 mg via INTRAVENOUS
  Filled 2020-09-14: qty 51

## 2020-09-14 MED ORDER — SODIUM CHLORIDE 0.9 % IV SOLN
Freq: Once | INTRAVENOUS | Status: AC
  Filled 2020-09-14: qty 250

## 2020-09-14 MED ORDER — DOXORUBICIN HCL CHEMO IV INJECTION 2 MG/ML
50.0000 mg/m2 | Freq: Once | INTRAVENOUS | Status: AC
  Administered 2020-09-14: 102 mg via INTRAVENOUS
  Filled 2020-09-14: qty 51

## 2020-09-14 MED ORDER — INV-PROCHLORPERAZINE MALEATE/PLACEBO 10 MG CAP URCC 16070 BLISTER CARD 2: 1.0000 | ORAL_CAPSULE | Freq: Three times a day (TID) | ORAL | 0 refills | Status: DC

## 2020-09-14 MED ORDER — HEPARIN SOD (PORK) LOCK FLUSH 100 UNIT/ML IV SOLN
500.0000 [IU] | Freq: Once | INTRAVENOUS | Status: AC | PRN
  Administered 2020-09-14: 500 [IU]
  Filled 2020-09-14: qty 5

## 2020-09-14 NOTE — Patient Instructions (Signed)
[facility] Discharge Instructions for Patients Receiving Chemotherapy  Today you received the following chemotherapy agents Doxorubicin; Cyclophosphamide  To help prevent nausea and vomiting after your treatment, we encourage you to take your nausea medication as directed If you develop nausea and vomiting that is not controlled by your nausea medication, call the clinic.   BELOW ARE SYMPTOMS THAT SHOULD BE REPORTED IMMEDIATELY:  *FEVER GREATER THAN 100.5 F  *CHILLS WITH OR WITHOUT FEVER  NAUSEA AND VOMITING THAT IS NOT CONTROLLED WITH YOUR NAUSEA MEDICATION  *UNUSUAL SHORTNESS OF BREATH  *UNUSUAL BRUISING OR BLEEDING  TENDERNESS IN MOUTH AND THROAT WITH OR WITHOUT PRESENCE OF ULCERS  *URINARY PROBLEMS  *BOWEL PROBLEMS  UNUSUAL RASH Items with * indicate a potential emergency and should be followed up as soon as possible.  Feel free to call the clinic should you have any questions or concerns. The clinic phone number is [phone].  Please show the CHEMO ALERT CARD at check-in to the Emergency Department and triage nurse.   

## 2020-09-14 NOTE — Research (Signed)
[id number] - TREATMENT OF REFRACTORY NAUSEA  09/14/20   Cycle 2 Visit: Melody Rodriguez presented to the clinic today for her second cycle of chemotherapy.  The patient confirmed she wanted to continue in the study and receive study medication today.  She was seen by Lillard Anes, NP prior to her treatment.  Cycle 2 Treatment: Chemo premedications were administered per protocol by Domenica Reamer, RN.  Netupitant-palonsetron 300-0.5mg  was administered orally at 0951.  12mg  of dexamethasone was administered orally at 1020.  The capsules containing 10mg  of prochlorperazine or placebo, and 10mg  of olanzapine or placebo, were both administered at 1052.  Chemotherapy was started at 1053.  Home Medications: A blister pack containing the home medications for the patient was dispensed to the patient.  She was instructed on how to take these medications and given written instructions.  She was instructed how fill out the medication diary.  She was advised to not take zofran until the third day after chemo, and to not take benzodiazepines or metoclopramide while taking study medication. She was also advised to alert providers that she is on a research study taking study medication if she calls with symptoms or presents to an ER. The patient was urged to call with any side effects or new symptoms she notices. Patient verbalized understanding and denied having any questions.  Home Record and Questionnaires: The patient was also given the 4 day home record and a paper version of the questionnaires.  She was instructed to fill out the 4 day home record and return it in the mail along with the medication diary after day 4 in a provided preaddressed, prepaid envelope.  She was instructed to fill out the paper version of the questionnaires only if she is unable to complete the questionnaires online.  Follow up: The patient was made aware that she will receive a call on Monday 09/21/2019 to remind her to complete diary and  questionnaires as the office will be closed on Friday 09/17/2020.    The patient was thanked for her time and ongoing participation in the research study.  Monday Clinical Research Coordinator I  09/14/20 3:33 PM

## 2020-09-14 NOTE — Progress Notes (Signed)
1130--Patient with complaint of mild nasal burning. Rate of cytoxan decreased to 339mL/hr.

## 2020-09-14 NOTE — Progress Notes (Signed)
Patient c/o burning in sinus cavity accompanied by headache during Cytoxan infusion.  RN slowed rate per protocol.  Burning subsided minimally, however did not worsen.  Lillard Anes, NP notified - verbal orders for Tylenol 650mg  given for headache.  RN updated pharmacy to inquire rate for future infusions.

## 2020-09-14 NOTE — Progress Notes (Signed)
Patient stated she was getting a port placed tomorrow and the PICC line removed. However, I changed the PICC dressing, statlock and the biopatch today.

## 2020-09-14 NOTE — Patient Instructions (Signed)
PICC Home Care Guide  A peripherally inserted central catheter (PICC) is a form of IV access that allows medicines and IV fluids to be quickly distributed throughout the body. The PICC is a long, thin, flexible tube (catheter) that is inserted into a vein in the upper arm. The catheter ends in a large vein in the chest (superior vena cava, or SVC). After the PICC is inserted, a chest X-ray may be done to make sure that it is in the correct place. A PICC may be placed for different reasons, such as:  To give medicines and liquid nutrition.  To give IV fluids and blood products.  If there is trouble placing a peripheral intravenous (PIV) catheter. If taken care of properly, a PICC can remain in place for several months. Having a PICC can also allow a person to go home from the hospital sooner. Medicine and PICC care can be managed at home by a family member, caregiver, or home health care team. What are the risks? Generally, having a PICC is safe. However, problems may occur, including:  A blood clot (thrombus) forming in or at the tip of the PICC.  A blood clot forming in a vein (deep vein thrombosis) or traveling to the lung (pulmonary embolism).  Inflammation of the vein (phlebitis) in which the PICC is placed.  Infection. Central line associated blood stream infection (CLABSI) is a serious infection that often requires hospitalization.  PICC movement (malposition). The PICC tip may move from its original position due to excessive physical activity, forceful coughing, sneezing, or vomiting.  A break or cut in the PICC. It is important not to use scissors near the PICC.  Nerve or tendon irritation or injury during PICC insertion. How to take care of your PICC Preventing problems  You and any caregivers should wash your hands often with soap. Wash hands: ? Before touching the PICC line or the infusion device. ? Before changing a bandage (dressing).  Flush the PICC as told by your  health care provider. Let your health care provider know right away if the PICC is hard to flush or does not flush. Do not use force to flush the PICC.  Do not use a syringe that is less than 10 mL to flush the PICC.  Avoid blood pressure checks on the arm in which the PICC is placed.  Never pull or tug on the PICC.  Do not take the PICC out yourself. Only a trained clinical professional should remove the PICC.  Use clean and sterile supplies only. Keep the supplies in a dry place. Do not reuse needles, syringes, or any other supplies. Doing that can lead to infection.  Keep pets and children away from your PICC line.  Check the PICC insertion site every day for signs of infection. Check for: ? Leakage. ? Redness, swelling, or pain. ? Fluid or blood. ? Warmth. ? Pus or a bad smell. PICC dressing care  Keep your PICC bandage (dressing) clean and dry to prevent infection.  Do not take baths, swim, or use a hot tub until your health care provider approves. Ask your health care provider if you can take showers. You may only be allowed to take sponge baths for bathing. When you are allowed to shower: ? Ask your health care provider to teach you how to wrap the PICC line. ? Cover the PICC line with clear plastic wrap and tape to keep it dry while showering.  Follow instructions from your health care provider   about how to take care of your insertion site and dressing. Make sure you: ? Wash your hands with soap and water before you change your bandage (dressing). If soap and water are not available, use hand sanitizer. ? Change your dressing as told by your health care provider. ? Leave stitches (sutures), skin glue, or adhesive strips in place. These skin closures may need to stay in place for 2 weeks or longer. If adhesive strip edges start to loosen and curl up, you may trim the loose edges. Do not remove adhesive strips completely unless your health care provider tells you to do  that.  Change your PICC dressing if it becomes loose or wet. General instructions   Carry your PICC identification card or wear a medical alert bracelet at all times.  Keep the tube clamped at all times, unless it is being used.  Carry a smooth-edge clamp with you at all times to place on the tube if it breaks.  Do not use scissors or sharp objects near the tube.  You may bend your arm and move it freely. If your PICC is near or at the bend of your elbow, avoid activity with repeated motion at the elbow.  Avoid lifting heavy objects as told by your health care provider.  Keep all follow-up visits as told by your health care provider. This is important. Disposal of supplies  Throw away any syringes in a disposal container that is meant for sharp items (sharps container). You can buy a sharps container from a pharmacy, or you can make one by using an empty hard plastic bottle with a cover.  Place any used dressings or infusion bags into a plastic bag. Throw that bag in the trash. Contact a health care provider if:  You have pain in your arm, ear, face, or teeth.  You have a fever or chills.  You have redness, swelling, or pain around the insertion site.  You have fluid or blood coming from the insertion site.  Your insertion site feels warm to the touch.  You have pus or a bad smell coming from the insertion site.  Your skin feels hard and raised around the insertion site. Get help right away if:  Your PICC is accidentally pulled all the way out. If this happens, cover the insertion site with a bandage or gauze dressing. Do not throw the PICC away. Your health care provider will need to check it.  Your PICC was tugged or pulled and has partially come out. Do not  push the PICC back in.  You cannot flush the PICC, it is hard to flush, or the PICC leaks around the insertion site when it is flushed.  You hear a "flushing" sound when the PICC is flushed.  You feel your  heart racing or skipping beats.  There is a hole or tear in the PICC.  You have swelling in the arm in which the PICC was inserted.  You have a red streak going up your arm from where the PICC was inserted. Summary  A peripherally inserted central catheter (PICC) is a long, thin, flexible tube (catheter) that is inserted into a vein in the upper arm.  The PICC is inserted using a sterile technique by a specially trained nurse or physician. Only a trained clinical professional should remove it.  Keep your PICC identification card with you at all times.  Avoid blood pressure checks on the arm in which the PICC is placed.  If cared for   properly, a PICC can remain in place for several months. Having a PICC can also allow a person to go home from the hospital sooner. This information is not intended to replace advice given to you by your health care provider. Make sure you discuss any questions you have with your health care provider. Document Revised: 08/17/2017 Document Reviewed: 10/07/2016 Elsevier Patient Education  2020 [employer]  

## 2020-09-14 NOTE — Progress Notes (Signed)
[facility] CSW Progress Note  Holiday representative met with patient to provide ongoing support. Pt's son is still with family in [place] since he had tested positive for Covid. Pt is otherwise doing well and is ready to get her port tomorrow. 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  CSW will see pt in infusion on 09/28/2020    Christeen Douglas , LCSW

## 2020-09-15 ENCOUNTER — Ambulatory Visit (HOSPITAL_COMMUNITY)
Admission: RE | Admit: 2020-09-15 | Discharge: 2020-09-15 | Disposition: A | Discharge status: Home / Self Care | Payer: Medicaid Other | Source: Ambulatory Visit | Attending: Hematology and Oncology | Admitting: Hematology and Oncology

## 2020-09-15 ENCOUNTER — Other Ambulatory Visit: Payer: Self-pay

## 2020-09-15 ENCOUNTER — Other Ambulatory Visit: Payer: Self-pay | Admitting: Hematology and Oncology

## 2020-09-15 ENCOUNTER — Other Ambulatory Visit (HOSPITAL_COMMUNITY): Payer: Self-pay | Admitting: Student

## 2020-09-15 ENCOUNTER — Telehealth: Payer: Self-pay | Admitting: Adult Health

## 2020-09-15 ENCOUNTER — Encounter (HOSPITAL_COMMUNITY): Payer: Self-pay

## 2020-09-15 DIAGNOSIS — C50411 Malignant neoplasm of upper-outer quadrant of right female breast: Secondary | ICD-10-CM | POA: Diagnosis present

## 2020-09-15 DIAGNOSIS — Z170 Estrogen receptor positive status [ER+]: Secondary | ICD-10-CM

## 2020-09-15 DIAGNOSIS — F17210 Nicotine dependence, cigarettes, uncomplicated: Secondary | ICD-10-CM | POA: Diagnosis not present

## 2020-09-15 HISTORY — PX: IR IMAGING GUIDED PORT INSERTION: IMG5740

## 2020-09-15 HISTORY — PX: IR REMOVAL TUN CV CATH W/O FL: IMG2289

## 2020-09-15 MED ORDER — CEFAZOLIN SODIUM-DEXTROSE 2-4 GM/100ML-% IV SOLN: 2.0000 g | Freq: Once | INTRAVENOUS | Status: DC

## 2020-09-15 MED ORDER — SODIUM CHLORIDE 0.9 % IV SOLN: INTRAVENOUS | Status: DC

## 2020-09-15 MED ORDER — LIDOCAINE-EPINEPHRINE 1 %-1:100000 IJ SOLN
INTRAMUSCULAR | Status: AC
  Filled 2020-09-15: qty 1

## 2020-09-15 MED ORDER — FENTANYL CITRATE (PF) 100 MCG/2ML IJ SOLN
INTRAMUSCULAR | Status: AC | PRN
  Administered 2020-09-15 (×2): 50 ug via INTRAVENOUS

## 2020-09-15 MED ORDER — MIDAZOLAM HCL 2 MG/2ML IJ SOLN
INTRAMUSCULAR | Status: AC | PRN
  Administered 2020-09-15 (×3): 1 mg via INTRAVENOUS

## 2020-09-15 MED ORDER — MIDAZOLAM HCL 2 MG/2ML IJ SOLN
INTRAMUSCULAR | Status: AC
  Filled 2020-09-15: qty 4

## 2020-09-15 MED ORDER — HEPARIN SOD (PORK) LOCK FLUSH 100 UNIT/ML IV SOLN
INTRAVENOUS | Status: AC
  Filled 2020-09-15: qty 5

## 2020-09-15 MED ORDER — CEFAZOLIN SODIUM-DEXTROSE 2-4 GM/100ML-% IV SOLN
INTRAVENOUS | Status: AC
  Filled 2020-09-15: qty 100

## 2020-09-15 MED ORDER — FENTANYL CITRATE (PF) 100 MCG/2ML IJ SOLN
INTRAMUSCULAR | Status: AC
  Filled 2020-09-15: qty 2

## 2020-09-15 NOTE — Telephone Encounter (Signed)
No 12/28 los. No changes made to pt's schedule.  

## 2020-09-15 NOTE — Procedures (Signed)
Interventional Radiology Procedure Note  Procedure: Placement of a left IJ approach single lumen PowerPort.  Tip is positioned at the superior cavoatrial junction and catheter is ready for immediate use.  Complications: No immediate Recommendations:  - Ok to shower tomorrow - Do not submerge for 7 days - Routine line care   Signed,  Heath K. McCullough, MD   

## 2020-09-15 NOTE — Discharge Instructions (Signed)

## 2020-09-15 NOTE — Procedures (Signed)
Port-a-catheter placed today, PICC no longer needed and was removed.   Alwyn Ren, [place] [phone] 09/15/2020, 3:41 PM

## 2020-09-15 NOTE — Progress Notes (Addendum)
Vitals taken at 12:17

## 2020-09-15 NOTE — H&P (Signed)
Chief Complaint: Patient was seen in consultation today for port-a-catheter placement  Referring Physician(s): Nicholas Lose  Supervising Physician: Jacqulynn Cadet  Patient Status: Melody Rodriguez - Out-pt  History of Present Illness: Melody Rodriguez is a 44 y.o. female with a medical history significant for recently diagnosed right breast cancer. She has had a palpable right breast mass for 1-2 years and had it evaluated 07/28/20. 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 She will receive chemo and radiation therapy and this may be followed by a mastectomy versus breast conserving surgery with targeted node dissection.    Interventional Radiology has been asked to evaluate this patient for an image-guided port-a-catheter placement to facilitate her treatment plans. Of note, she presented to IR 08/27/20 for port-a-catheter placement but did not have a driver. She had a PICC placed by IR 09/01/20 and had had two cycles of chemotherapy so far.   Past Medical History:  Diagnosis Date  . Breast cancer ([place])   . Family history of colon cancer     Past Surgical History:  Procedure Laterality Date  . BREAST CYST EXCISION Right    Patient does not recall (2014 or 2015)  . CESAREAN SECTION    . THYROIDECTOMY, PARTIAL      Allergies: Latex  Medications: Prior to Admission medications   Medication Sig Start Date End Date Taking? Authorizing Provider  cyclobenzaprine (FLEXERIL) 5 MG tablet Take 1 tablet (5 mg total) by mouth 3 (three) times daily as needed for muscle spasms. 08/17/20   Nicholas Lose, MD  dexamethasone (DECADRON) 4 MG tablet Take 1 tablet (4 mg total) by mouth daily. Take 1 tablet day after chemo and 1 tablet 2 days after chemo with food 08/19/20   Nicholas Lose, MD  HYDROcodone-acetaminophen (NORCO) 5-325 MG tablet Take 1 tablet by mouth every 6 (six) hours as needed (for pain). 09/12/20    Molpus, John, MD  Investigational dexamethasone 4 MG tablet URCC 16070 Blister Card 2 Take 2 tablets by mouth daily. Take in the morning on Days 2-4. 09/14/20   Nicholas Lose, MD  Investigational olanzapine/placebo 10 MG capsule [facility]RCC 16070 Blister Card 2 Take 1 capsule by mouth daily. Take in the morning on Days 2-4. 09/14/20   Nicholas Lose, MD  Investigational prochlorperazine maleate/placebo 10 MG capsule [facility]RCC 16070 Blister Card 2 Take 1 capsule by mouth every 8 (eight) hours. Take on Days 1-4. 09/14/20   Nicholas Lose, MD  lidocaine-prilocaine (EMLA) cream Apply to affected area once 08/19/20   Nicholas Lose, MD  loratadine (CLARITIN) 10 MG tablet Take 10 mg by mouth daily. 09/01/20   [provider]  ondansetron (ZOFRAN) 8 MG tablet Take 1 tablet (8 mg total) by mouth 2 (two) times daily as needed. Start on the third day after chemotherapy. 08/19/20   Nicholas Lose, MD     Family History  Problem Relation Age of Onset  . Hypertension Mother   . Colon cancer Sister 80  . Diabetes Maternal Grandmother   . Cancer Maternal Aunt        unknown type dx late 65s    Social History   Socioeconomic History  . Marital status: Single    Spouse name: Not on file  . Number of children: 2  . Years of education: Not on file  . Highest education level: Some college, no degree  Occupational History  . Not on file  Tobacco Use  .  Smoking status: Current Every Day Smoker    Packs/day: 1.00    Types: Cigars  . Smokeless tobacco: Never Used  Vaping Use  . Vaping Use: Never used  Substance and Sexual Activity  . Alcohol use: Yes  . Drug use: Yes    Types: Marijuana  . Sexual activity: Not Currently  Other Topics Concern  . Not on file  Social History Narrative  . Not on file   Social Determinants of Health   Financial Resource Strain: High Risk  . Difficulty of Paying Living Expenses: Hard  Food Insecurity: No Food Insecurity  . Worried About Charity fundraiser in the Last  Year: Never true  . Ran Out of Food in the Last Year: Never true  Transportation Needs: No Transportation Needs  . Lack of Transportation (Medical): No  . Lack of Transportation (Non-Medical): No  Physical Activity: Not on file  Stress: Not on file  Social Connections: Not on file    Review of Systems: A 12 point ROS discussed and pertinent positives are indicated in the HPI above.  All other systems are negative.  Review of Systems  Constitutional: Negative for appetite change and fatigue.  Respiratory: Negative for cough and shortness of breath.   Cardiovascular: Negative for chest pain and leg swelling.  Gastrointestinal: Negative for diarrhea, nausea and vomiting.  Musculoskeletal: Negative for back pain.  Neurological: Positive for dizziness. Negative for headaches.    Vital Signs: BP 128/67   Pulse 68   Temp 98 F (36.7 C)   Resp 20   LMP 09/03/2020   SpO2 99%   Physical Exam Constitutional:      General: She is not in acute distress. HENT:     Mouth/Throat:     Mouth: Mucous membranes are moist.     Pharynx: Oropharynx is clear.  Cardiovascular:     Rate and Rhythm: Normal rate.     Pulses: Normal pulses.     Heart sounds: Normal heart sounds.     Comments: RUA PICC in place, not in use.  Pulmonary:     Effort: Pulmonary effort is normal.     Breath sounds: Normal breath sounds.  Abdominal:     General: Bowel sounds are normal.     Palpations: Abdomen is soft.  Musculoskeletal:        General: Normal range of motion.     Cervical back: Normal range of motion.  Skin:    General: Skin is warm and dry.  Neurological:     Mental Status: She is alert and oriented to person, place, and time.  Psychiatric:        Mood and Affect: Mood normal.        Behavior: Behavior normal.        Thought Content: Thought content normal.        Judgment: Judgment normal.     Imaging: DG Chest 2 View  Result Date: 09/12/2020 CLINICAL DATA:  Chest pain. Chills and  body aches. COVID-19 exposure. EXAM: CHEST - 2 VIEW COMPARISON:  None. FINDINGS: A right PICC terminates over the upper SVC. The cardiomediastinal silhouette is within normal limits. No airspace consolidation, edema, pleural effusion, or pneumothorax is identified. No acute osseous abnormality is seen. IMPRESSION: No active cardiopulmonary disease. Electronically Signed   By: Logan Bores M.D.   On: 09/12/2020 01:23   ECHOCARDIOGRAM COMPLETE  Result Date: 08/27/2020    ECHOCARDIOGRAM REPORT   Patient Name:   Melody Rodriguez Date of Exam:  08/27/2020 Medical Rec #:  [id number]    Height:       64.0 in Accession #:    [id number]   Weight:       200.0 lb Date of Birth:  08-12-1976    BSA:          1.956 m Patient Age:    89 years     BP:           125/75 mmHg Patient Gender: F            HR:           67 bpm. Exam Location:  Outpatient Procedure: 2D Echo, 3D Echo, Cardiac Doppler, Color Doppler and Strain Analysis Indications:    Z51.11 Encounter for antineoplastic chemotheraphy  History:        Patient has no prior history of Echocardiogram examinations.                 Breast Cancer.  Sonographer:    Tiffany Dance Referring Phys: [id number] Nicholas Lose IMPRESSIONS  1. Priminent papillary muscles     Normal GLS -16.8. Left ventricular ejection fraction, by estimation, is 55 to 60%. The left ventricle has normal function. The left ventricle has no regional wall motion abnormalities. Left ventricular diastolic parameters were normal.  2. Right ventricular systolic function is normal. The right ventricular size is normal.  3. The mitral valve is normal in structure. No evidence of mitral valve regurgitation. No evidence of mitral stenosis.  4. The aortic valve is tricuspid. Aortic valve regurgitation is not visualized. Mild aortic valve sclerosis is present, with no evidence of aortic valve stenosis.  5. The inferior vena cava is normal in size with greater than 50% respiratory variability, suggesting right atrial pressure  of 3 mmHg. FINDINGS  Left Ventricle: Priminent papillary muscles Normal GLS -16.8. Left ventricular ejection fraction, by estimation, is 55 to 60%. The left ventricle has normal function. The left ventricle has no regional wall motion abnormalities. The left ventricular internal cavity size was normal in size. There is no left ventricular hypertrophy. Left ventricular diastolic parameters were normal. Right Ventricle: The right ventricular size is normal. No increase in right ventricular wall thickness. Right ventricular systolic function is normal. Left Atrium: Left atrial size was normal in size. Right Atrium: Right atrial size was normal in size. Pericardium: There is no evidence of pericardial effusion. Mitral Valve: The mitral valve is normal in structure. There is mild thickening of the mitral valve leaflet(s). No evidence of mitral valve regurgitation. No evidence of mitral valve stenosis. Tricuspid Valve: The tricuspid valve is normal in structure. Tricuspid valve regurgitation is trivial. No evidence of tricuspid stenosis. Aortic Valve: The aortic valve is tricuspid. Aortic valve regurgitation is not visualized. Mild aortic valve sclerosis is present, with no evidence of aortic valve stenosis. Pulmonic Valve: The pulmonic valve was normal in structure. Pulmonic valve regurgitation is not visualized. No evidence of pulmonic stenosis. Aorta: The aortic root is normal in size and structure. Venous: The inferior vena cava is normal in size with greater than 50% respiratory variability, suggesting right atrial pressure of 3 mmHg. IAS/Shunts: No atrial level shunt detected by color flow Doppler.  LEFT VENTRICLE PLAX 2D LVIDd:         4.60 cm  Diastology LVIDs:         2.80 cm  LV e' medial:    6.85 cm/s LV PW:         0.90 cm  LV E/e'  medial:  11.2 LV IVS:        0.90 cm  LV e' lateral:   7.94 cm/s LVOT diam:     2.20 cm  LV E/e' lateral: 9.6 LV SV:         84 LV SV Index:   43 LVOT Area:     3.80 cm                           3D Volume EF:                         3D EF:        58 %                         LV EDV:       163 ml                         LV ESV:       68 ml                         LV SV:        95 ml RIGHT VENTRICLE             IVC RV Basal diam:  2.60 cm     IVC diam: 1.60 cm RV S prime:     14.40 cm/s TAPSE (M-mode): 2.4 cm LEFT ATRIUM             Index       RIGHT ATRIUM           Index LA diam:        3.40 cm 1.74 cm/m  RA Area:     15.20 cm LA Vol (A2C):   78.3 ml 40.02 ml/m RA Volume:   36.80 ml  18.81 ml/m LA Vol (A4C):   38.8 ml 19.83 ml/m LA Biplane Vol: 56.2 ml 28.73 ml/m  AORTIC VALVE LVOT Vmax:   114.00 cm/s LVOT Vmean:  71.300 cm/s LVOT VTI:    0.220 m  AORTA Ao Root diam: 3.10 cm Ao Asc diam:  3.20 cm MITRAL VALVE MV Area (PHT): 4.06 cm    SHUNTS MV Decel Time: 187 msec    Systemic VTI:  0.22 m MV E velocity: 76.40 cm/s  Systemic Diam: 2.20 cm MV A velocity: 83.20 cm/s MV E/A ratio:  0.92 Jenkins Rouge MD Electronically signed by Jenkins Rouge MD Signature Date/Time: 08/27/2020/10:50:01 AM    Final    MM CLIP PLACEMENT RIGHT  Result Date: 08/20/2020 CLINICAL DATA:  Status post MR guided core biopsy of the right breast. EXAM: 3D DIAGNOSTIC RIGHT MAMMOGRAM POST MRI BIOPSY COMPARISON:  Previous exam(s). FINDINGS: 3D Mammographic images were obtained following MR guided core biopsy of non masslike enhancement in the right breast. The biopsy marking clip is in the upper-outer quadrant of the right breast. IMPRESSION: Appropriate positioning of the barbell shaped biopsy marking clip at the site of biopsy in the upper-outer quadrant of the right breast. The barbell shaped clip is noted to be 2.9 cm posterior to the ribbon shaped clip. Final Assessment: Post Procedure Mammograms for Marker Placement Electronically Signed   By: Lillia Mountain M.D.   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 This was found to be concordant by Dr. Lillia Mountain, with bracketed excision recommended, to include the barbell marking clip put in at the time of MRI guided biopsy on August 20, 2020. Pathology results were discussed with the patient by telephone. The patient reported doing well after the biopsy with tenderness and bleeding at the site. Post biopsy instructions and care were reviewed and questions were answered. The patient was encouraged to call [facility] for any additional concerns. My direct phone number was provided. The patient has a recent diagnosis of Right breast cancer and should follow her outlined treatment plan. Bary Castilla, RN Oncology Nurse Navigator, was notified of biopsy results via EPIC message on August 23, 2020. Pathology results reported by Terie Purser, RN on 08/23/2020. Electronically Signed   By: Lillia Mountain M.D.   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 Patient's recent MRI showed suspicious non masslike enhancement extending for from the posterior margin of the mass. Biopsy was recommended for extent of disease. EXAM: MRI GUIDED CORE NEEDLE BIOPSY OF THE RIGHT BREAST TECHNIQUE: Multiplanar, multisequence MR imaging of the right breast was performed both before and after administration of intravenous contrast. CONTRAST:  22m GADAVIST GADOBUTROL 1 MMOL/ML IV SOLN COMPARISON:  Previous exams. FINDINGS: I met with the patient, and we discussed the procedure of MRI guided biopsy, including risks, benefits, and alternatives. Specifically, we discussed the risks of infection, bleeding, tissue injury, clip migration, and inadequate sampling. Informed, written consent was  given. The usual time out protocol was performed immediately prior to the procedure. Using sterile technique, 1% lidocaine and 1% lidocaine with epinephrine, MRI guidance, and a 9 gauge vacuum assisted device, biopsy was performed of non masslike enhancement in the upper-outer quadrant of the right breast using a lateral to medial approach. At the conclusion of the procedure, a barbell shaped tissue marker clip was deployed into the biopsy cavity. Follow-up 2-view mammogram was performed and dictated separately. IMPRESSION: MRI guided biopsy of the right breast.  No apparent complications. Electronically Signed: By: DLillia MountainM.D. On: 08/20/2020 10:22   IR PICC PLACEMENT RIGHT >5 YRS INC IMG GUIDE  Result Date: 09/01/2020 INDICATION: Patient with newly diagnosed breast cancer presents today for PICC placement to initiate chemotherapy. EXAM: ULTRASOUND AND FLUOROSCOPIC GUIDED PICC LINE INSERTION MEDICATIONS: 1% lidocaine, 3 mL CONTRAST:  None FLUOROSCOPY TIME:  48 seconds (9 mGy) COMPLICATIONS: None immediate. TECHNIQUE: The procedure, risks, benefits, and alternatives were explained to the patient and informed written consent was obtained. The right upper extremity was prepped with chlorhexidine in a sterile fashion, and a sterile drape was applied covering the operative field. Maximum barrier sterile technique with sterile gowns and gloves were used for the procedure. A timeout was performed prior to the initiation of the procedure. Local anesthesia was provided with 1% lidocaine. After the overlying soft tissues were anesthetized with 1% lidocaine, a micropuncture kit was utilized to access the right brachial vein. Real-time ultrasound guidance was utilized for vascular access including the acquisition of a permanent ultrasound image documenting patency of the accessed vessel. A guidewire was advanced to the level of the superior caval-atrial junction for measurement purposes  and the PICC line was cut to  length. A peel-away sheath was placed and a 34 cm, 5 [place], dual lumen was inserted to level of the superior caval-atrial junction. A post procedure spot fluoroscopic was obtained. The catheter easily aspirated and flushed and was secured in place. A dressing was placed. The patient tolerated the procedure well without immediate post procedural complication. FINDINGS: After catheter placement, the tip lies within the superior cavoatrial junction. The catheter aspirates and flushes normally and is ready for immediate use. IMPRESSION: Successful ultrasound and fluoroscopic guided placement of a right brachial vein approach, 34 cm, 5 French, dual lumen PICC with tip at the superior caval-atrial junction. The PICC line is ready for immediate use. Read by: Soyla Dryer, NP Electronically Signed   By: Aletta Edouard M.D.   On: 09/01/2020 09:53    Labs:  CBC: Recent Labs    09/01/20 1013 09/07/20 1055 09/12/20 0056 09/14/20 0822  WBC 9.7 1.9* 11.3* 16.5*  HGB 10.9* 11.3* 9.8* 9.9*  HCT 35.7* 35.6* 32.1* 32.4*  PLT 257 132* 116* 127*    COAGS: No results for input(s): INR, APTT in the last 8760 hours.  BMP: Recent Labs    01/12/20 0759 08/04/20 0825 09/01/20 1013 09/07/20 1055 09/12/20 0056 09/14/20 0822  NA 140   < > 143 138 139 141  K 3.7   < > 3.9 3.4* 3.5 4.0  CL 108   < > 115* 108 106 110  CO2 25   < > _0 GLUCOSE 150*   < > 109* 109* 117* 133*  BUN 7   < > _1 CALCIUM 8.6*   < > 8.7* 8.5* 8.7* 8.7*  CREATININE 0.67   < > 0.73 0.66 0.64 0.66  GFRNONAA >60   < > >60 >60 >60 >60  GFRAA >60  --   --   --   --   --    < > = values in this interval not displayed.    LIVER FUNCTION TESTS: Recent Labs    09/01/20 1013 09/07/20 1055 09/12/20 0056 09/14/20 0822  BILITOT 0.5 0.5 0.2* <0.2*  AST 12* 12* 14* 14*  ALT _2 ALKPHOS 48 59 44 63  PROT 7.1 7.0 6.0* 6.4*  ALBUMIN 3.9 3.8 3.4* 3.5    TUMOR MARKERS: No results for input(s): AFPTM, CEA,  CA199, CHROMGRNA in the last 8760 hours.  Assessment and Plan:  Right Breast Cancer: Chair Hendel, 44 year old female, presents today to the [facility] department for an image-guide port-a-catheter placement. The right upper arm PICC will be removed after the procedure.   Risks and benefits of an image-guided port-a-catheter placement were discussed with the patient including, but not limited to bleeding, infection, pneumothorax, or fibrin sheath development and need for additional procedures.  All of the patient's questions were answered, patient is agreeable to proceed.  She has been NPO. Labs and vitals have been reviewed.   Consent signed and in chart.  Thank you for this interesting consult.  I greatly enjoyed meeting Melody Rodriguez and look forward to participating in their care.  A copy of this report was sent to the requesting provider on this date.  Electronically Signed: Soyla Dryer, AGACNP-BC [phone] 09/15/2020, 1:03 PM   I spent a total of  30 Minutes   in face to face in clinical consultation, greater than 50% of which was counseling/coordinating care for port-a-catheter placement.

## 2020-09-16 ENCOUNTER — Inpatient Hospital Stay: Payer: Medicaid Other

## 2020-09-16 VITALS — BP 149/82 | HR 70 | Temp 99.1°F | Resp 17

## 2020-09-16 DIAGNOSIS — Z5111 Encounter for antineoplastic chemotherapy: Secondary | ICD-10-CM | POA: Diagnosis not present

## 2020-09-16 DIAGNOSIS — C50411 Malignant neoplasm of upper-outer quadrant of right female breast: Secondary | ICD-10-CM

## 2020-09-16 DIAGNOSIS — Z170 Estrogen receptor positive status [ER+]: Secondary | ICD-10-CM

## 2020-09-16 MED ORDER — PEGFILGRASTIM-CBQV 6 MG/0.6ML ~~LOC~~ SOSY
6.0000 mg | PREFILLED_SYRINGE | Freq: Once | SUBCUTANEOUS | Status: AC
  Administered 2020-09-16: 6 mg via SUBCUTANEOUS

## 2020-09-16 MED ORDER — PEGFILGRASTIM-CBQV 6 MG/0.6ML ~~LOC~~ SOSY
PREFILLED_SYRINGE | SUBCUTANEOUS | Status: AC
  Filled 2020-09-16: qty 0.6

## 2020-09-16 NOTE — Progress Notes (Signed)
..  The following Assist/Replace Program for Udenyca from [employer] has been terminated due to Medicaid started 08/18/2020, per [employer] no longer qualifies for Replacement Program.  Last DOS:09/03/2020

## 2020-09-16 NOTE — Progress Notes (Signed)
..  The following Assist/Replace Program for Venofer from [facility] has been terminated due to Per MAP patient no longer qualifies for Assistance Medicaid starting 08/18/2020.  Last DOS:08/26/2020

## 2020-09-17 LAB — CULTURE, BLOOD (ROUTINE X 2)
Culture: NO GROWTH
Culture: NO GROWTH
Special Requests: ADEQUATE
Special Requests: ADEQUATE

## 2020-09-20 ENCOUNTER — Telehealth: Payer: Self-pay | Admitting: *Deleted

## 2020-09-20 ENCOUNTER — Telehealth: Payer: Self-pay | Admitting: Emergency Medicine

## 2020-09-20 NOTE — Telephone Encounter (Signed)
[id number] - TREATMENT OF REFRACTORY NAUSEA  09/20/20  1107  Cycle 2 Telephone call: Delaynie Stetzer  was called today (Cycle 2, Day 7) to remind her to complete medication diary and 4 day home diary and return them through the mail using the prepaid envelope.  She stated she has not finished filling out the paper forms yet.  She has already completed the other questionnaires online.    She states she been having muscle spasms and aching since 09/11/20.  She reports that the symptoms have not changed or worsened during the past week.  She states she has reached out to her Nurse Navigator, [place], this morning concerning this.  She did not report any other new symptoms or potential side effects.  The patient reports that she had one capsule left over in the blister pack provided.  This was the dose scheduled for the afternoon of day 1 of chemo, and she was still receiving her infusion at that time.  She was advised to bring this in to her next appointment on 09/28/20.  The patient verbalized understanding.  Plan:  The patient understands to return her paper medication diary and 4 day home record via the mail.  The remaining study medication will be collected at her next appointment on 09/28/20.  The patient was thanked for her time and ongoing participation.  Lavonna Rua Clinical Research Coordinator I  09/20/20  11:14 AM

## 2020-09-20 NOTE — Telephone Encounter (Signed)
Called and spoke with patient regarding her muscle cramps and aches.  She states she has not been drinking enough fluids and it is hard for her get water down. Encouraged her to try some flavors of gatorade. She could also take some warm baths to help with aches and cramps as well as she has flexeril at home to take as needed.  She knows to call the office if symptoms worsen.Patient verbalized understanding.

## 2020-09-27 ENCOUNTER — Telehealth: Payer: Self-pay | Admitting: Emergency Medicine

## 2020-09-27 NOTE — Progress Notes (Signed)
Patient Care Team: Patient, [name] as PCP - General (General Practice) Rockwell Germany, RN as Oncology Nurse Navigator Mauro Kaufmann, RN as Oncology Nurse Navigator [name], MD as Consulting Physician (General Surgery) Nicholas Lose, MD as Consulting Physician (Hematology and Oncology) Gery Pray, MD as Consulting Physician (Radiation Oncology)  DIAGNOSIS:    ICD-10-CM   1. Malignant neoplasm of upper-outer quadrant of right breast in female, estrogen receptor positive ([name])  C50.411    Z17.0     SUMMARY OF ONCOLOGIC HISTORY: Oncology History  Malignant neoplasm of upper-outer quadrant of right breast in female, estrogen receptor positive ([name])  07/28/2020 Initial Diagnosis   Patient palpated a right breast mass for 1-2 years. Mammogram showed a 2.2cm mass at the 11 o'clock position with surrounding calcifications, 6.4cm in total extent, and up to 5 abnormal right axillary lymph nodes. 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    08/05/2020 Miscellaneous   MammaPrint: High risk luminal type B   08/11/2020 Genetic Testing   Negative genetic testing: no pathogenic variants detected in [facility] Breast Cancer STAT Panel or Common Hereditary Cancers panel. The report dates are August 11, 2020 and August 19, 2020, respectively. Two variants of uncertain signficance were detected - one in the CTNNA1 gene called c.86del and the second in the MLH1 gene called c.808A>G.   The STAT Breast cancer panel offered by [facility] includes sequencing and rearrangement analysis for the following 9 genes:  ATM, BRCA1, BRCA2, CDH1, CHEK2, PALB2, PTEN, STK11 and TP53.  The Common Hereditary Cancers Panel offered by [facility] includes sequencing and/or deletion duplication testing of the following 48 genes: APC, ATM, AXIN2, BARD1, BMPR1A, BRCA1, BRCA2, BRIP1,  CDH1, CDK4, CDKN2A (p14ARF), CDKN2A (p16INK4a), CHEK2, CTNNA1, DICER1, EPCAM (Deletion/duplication testing only), GREM1 (promoter region deletion/duplication testing only), KIT, MEN1, MLH1, MSH2, MSH3, MSH6, MUTYH, NBN, NF1, NTHL1, PALB2, PDGFRA, PMS2, POLD1, POLE, PTEN, RAD50, RAD51C, RAD51D, RNF43, SDHB, SDHC, SDHD, SMAD4, SMARCA4. STK11, TP53, TSC1, TSC2, and VHL.  The following genes were evaluated for sequence changes only: SDHA and HOXB13 c.251G>A variant only.    09/01/2020 -  Chemotherapy   The patient had dexamethasone (DECADRON) 4 MG tablet, 4 mg (100 % of original dose 4 mg), Oral, Daily, 1 of 1 cycle, Start date: 08/19/2020, End date: -- Dose modification: 4 mg (original dose 4 mg, Cycle 0) DOXOrubicin (ADRIAMYCIN) chemo injection 122 mg, 60 mg/m2 = 122 mg, Intravenous,  Once, 2 of 4 cycles Dose modification: 50 mg/m2 (original dose 60 mg/m2, Cycle 2, Reason: Dose not tolerated) Administration: 122 mg (09/01/2020), 102 mg (09/14/2020) palonosetron (ALOXI) injection 0.25 mg, 0.25 mg, Intravenous,  Once, 1 of 7 cycles Administration: 0.25 mg (09/01/2020) pegfilgrastim-cbqv (UDENYCA) injection 6 mg, 6 mg, Subcutaneous, Once, 2 of 4 cycles Administration: 6 mg (09/03/2020), 6 mg (09/16/2020) cyclophosphamide (CYTOXAN) 1,220 mg in sodium chloride 0.9 % 250 mL chemo infusion, 600 mg/m2 = 1,220 mg, Intravenous,  Once, 2 of 4 cycles Dose modification: 500 mg/m2 (original dose 600 mg/m2, Cycle 2, Reason: Dose not tolerated) Administration: 1,220 mg (09/01/2020), 1,020 mg (09/14/2020) PACLitaxel (TAXOL) 162 mg in sodium chloride 0.9 % 250 mL chemo infusion (</= 38m/m2), 80 mg/m2 = 162 mg, Intravenous,  Once, 0 of 4 cycles fosaprepitant (EMEND) 150 mg in sodium chloride 0.9 % 145 mL IVPB, 150 mg, Intravenous,  Once, 1 of 7  cycles Administration: 150 mg (09/01/2020)  for chemotherapy treatment.      CHIEF COMPLIANT: Cycle 3 Day 1 Adriamycin and Cytoxan  INTERVAL HISTORY: Melody Rodriguez is a 45  y.o. with above-mentioned history of right breast cancercurrently on neoadjuvant chemotherapy with dose dense Adriamycin and Cytoxan.She presents to the clinic todayfor toxicity check and cycle 3. She does not have any further issues with nausea.  She has been eating well.  Her major complaints are hot flashes which are all through the day.  She wants to get some relief from the hot flashes.  The other complaint is stomach upset which is more like gastritis.  ALLERGIES:  is allergic to latex.  MEDICATIONS:  Current Outpatient Medications  Medication Sig Dispense Refill  . cyclobenzaprine (FLEXERIL) 5 MG tablet Take 1 tablet (5 mg total) by mouth 3 (three) times daily as needed for muscle spasms. 20 tablet 0  . dexamethasone (DECADRON) 4 MG tablet Take 1 tablet (4 mg total) by mouth daily. Take 1 tablet day after chemo and 1 tablet 2 days after chemo with food 8 tablet 0  . HYDROcodone-acetaminophen (NORCO) 5-325 MG tablet Take 1 tablet by mouth every 6 (six) hours as needed (for pain). 20 tablet 0  . Investigational dexamethasone 4 MG tablet [facility] 16070 Blister Card 2 Take 2 tablets by mouth daily. Take in the morning on Days 2-4. 6 tablet 0  . Investigational olanzapine/placebo 10 MG capsule [facility] 16070 Blister Card 2 Take 1 capsule by mouth daily. Take in the morning on Days 2-4. 3 capsule 0  . Investigational prochlorperazine maleate/placebo 10 MG capsule [facility] 16070 Blister Card 2 Take 1 capsule by mouth every 8 (eight) hours. Take on Days 1-4. 11 capsule 0  . lidocaine-prilocaine (EMLA) cream Apply to affected area once 30 g 3  . loratadine (CLARITIN) 10 MG tablet Take 10 mg by mouth daily.    . ondansetron (ZOFRAN) 8 MG tablet Take 1 tablet (8 mg total) by mouth 2 (two) times daily as needed. Start on the third day after chemotherapy. 30 tablet 1   No current facility-administered medications for this visit.    PHYSICAL EXAMINATION: ECOG PERFORMANCE STATUS: 1 - Symptomatic but completely  ambulatory  Vitals:   09/28/20 0830  BP: (!) 120/93  Pulse: 85  Resp: 18  Temp: (!) 97.3 F (36.3 C)  SpO2: 99%   Filed Weights   09/28/20 0830  Weight: 199 lb 9.6 oz (90.5 kg)    LABORATORY DATA:  I have reviewed the data as listed CMP Latest Ref Rng & Units 09/14/2020 09/12/2020 09/07/2020  Glucose 70 - 99 mg/dL 133(H) 117(H) 109(H)  BUN 6 - 20 mg/dL _0 Creatinine 0.44 - 1.00 mg/dL 0.66 0.64 0.66  Sodium 135 - 145 mmol/L 141 139 138  Potassium 3.5 - 5.1 mmol/L 4.0 3.5 3.4(L)  Chloride 98 - 111 mmol/L 110 106 108  CO2 22 - 32 mmol/L _1 Calcium 8.9 - 10.3 mg/dL 8.7(L) 8.7(L) 8.5(L)  Total Protein 6.5 - 8.1 g/dL 6.4(L) 6.0(L) 7.0  Total Bilirubin 0.3 - 1.2 mg/dL <0.2(L) 0.2(L) 0.5  Alkaline Phos 38 - 126 U/L 63 44 59  AST 15 - 41 U/L 14(L) 14(L) 12(L)  ALT 0 - 44 U/L _2 Lab Results  Component Value Date   WBC 20.9 (H) 09/28/2020   HGB 10.9 (L) 09/28/2020   HCT 33.9 (L) 09/28/2020   MCV 83.3 09/28/2020   PLT 156  09/28/2020   NEUTROABS PENDING 09/28/2020    ASSESSMENT & PLAN:  Malignant neoplasm of upper-outer quadrant of right breast in female, estrogen receptor positive ([place]) 07/28/2020:Patient palpated a right breast mass for 1-2 years. Mammogram showed a 2.2cm mass at the 11 o'clock position with surrounding calcifications, 6.4cm in total extent, and up to 5 abnormal right axillary lymph nodes. 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  Treatment plan: 1.Neoadjuvantchemotherapy (MammaPrint testHigh Risk): AC foll by Taxol  2.mastectomy versus breast conserving surgery with targeted node dissection 3.Adjuvant radiation therapy 4.Follow-up adjuvant antiestrogen therapy [facility] 16070: Treatment of refractory  nausea ------------------------------------------------------------------------------------------------------------------------------- Current Treatment: Cycle 3 day 1 Adriamycin and Cytoxan  Chemo toxicities: 1. Refractory nausea/vomiting: she is enrolled in [place] study.  No further nausea issues 2. bone pain/spasms due to Neulasta: Much better 3.  Alopecia 4. stomach upset: Sent prescription for Protonix 5. severe hot flashes: Sent a prescription for Effexor 6.  Elevated blood sugar: I discontinued dexamethasone  Monitoring closely for toxicities. Labs reviewed  Return to clinic in 2 weeks for cycle 4    No orders of the defined types were placed in this encounter.  The patient has a good understanding of the overall plan. she agrees with it. she will call with any problems that may develop before the next visit here.  Total time spent: 30 mins including face to face time and time spent for planning, charting and coordination of care  Nicholas Lose, MD 09/28/2020  I, Cloyde Reams Dorshimer, am acting as scribe for Dr. Nicholas Lose.  I have reviewed the above documentation for accuracy and completeness, and I agree with the above.

## 2020-09-27 NOTE — Telephone Encounter (Signed)
[phone] - TREATMENT OF REFRACTORY NAUSEA  09/27/20   Called and left voicemail to remind her to bring the unused study medication with her to her appointment tomorrow and to bring her forms with her as well if she has not mailed them already.  Advised to call back at [phone] with any questions.  Clabe Seal Clinical Research Coordinator I  09/27/20  4:37 PM

## 2020-09-28 ENCOUNTER — Inpatient Hospital Stay: Payer: Medicaid Other

## 2020-09-28 ENCOUNTER — Inpatient Hospital Stay (HOSPITAL_BASED_OUTPATIENT_CLINIC_OR_DEPARTMENT_OTHER): Payer: Medicaid Other | Admitting: Hematology and Oncology

## 2020-09-28 ENCOUNTER — Inpatient Hospital Stay: Payer: Medicaid Other | Attending: Hematology and Oncology

## 2020-09-28 ENCOUNTER — Encounter: Payer: Self-pay | Admitting: *Deleted

## 2020-09-28 ENCOUNTER — Encounter: Payer: Self-pay | Admitting: Emergency Medicine

## 2020-09-28 ENCOUNTER — Other Ambulatory Visit: Payer: Self-pay | Admitting: Hematology and Oncology

## 2020-09-28 ENCOUNTER — Other Ambulatory Visit: Payer: Self-pay

## 2020-09-28 VITALS — BP 112/65 | HR 75 | Resp 18

## 2020-09-28 DIAGNOSIS — C50411 Malignant neoplasm of upper-outer quadrant of right female breast: Secondary | ICD-10-CM | POA: Insufficient documentation

## 2020-09-28 DIAGNOSIS — Z923 Personal history of irradiation: Secondary | ICD-10-CM | POA: Diagnosis not present

## 2020-09-28 DIAGNOSIS — Z170 Estrogen receptor positive status [ER+]: Secondary | ICD-10-CM

## 2020-09-28 DIAGNOSIS — Z9221 Personal history of antineoplastic chemotherapy: Secondary | ICD-10-CM | POA: Insufficient documentation

## 2020-09-28 DIAGNOSIS — Z5189 Encounter for other specified aftercare: Secondary | ICD-10-CM | POA: Insufficient documentation

## 2020-09-28 DIAGNOSIS — Z452 Encounter for adjustment and management of vascular access device: Secondary | ICD-10-CM

## 2020-09-28 DIAGNOSIS — C773 Secondary and unspecified malignant neoplasm of axilla and upper limb lymph nodes: Secondary | ICD-10-CM | POA: Insufficient documentation

## 2020-09-28 DIAGNOSIS — Z7952 Long term (current) use of systemic steroids: Secondary | ICD-10-CM | POA: Diagnosis not present

## 2020-09-28 DIAGNOSIS — Z5111 Encounter for antineoplastic chemotherapy: Secondary | ICD-10-CM | POA: Diagnosis not present

## 2020-09-28 DIAGNOSIS — Z79899 Other long term (current) drug therapy: Secondary | ICD-10-CM | POA: Insufficient documentation

## 2020-09-28 DIAGNOSIS — D500 Iron deficiency anemia secondary to blood loss (chronic): Secondary | ICD-10-CM

## 2020-09-28 DIAGNOSIS — D508 Other iron deficiency anemias: Secondary | ICD-10-CM | POA: Diagnosis not present

## 2020-09-28 LAB — CMP (CANCER CENTER ONLY)
ALT: 12 U/L (ref 0–44)
AST: 10 U/L — ABNORMAL LOW (ref 15–41)
Albumin: 3.8 g/dL (ref 3.5–5.0)
Alkaline Phosphatase: 102 U/L (ref 38–126)
Anion gap: 11 (ref 5–15)
BUN: 11 mg/dL (ref 6–20)
CO2: 22 mmol/L (ref 22–32)
Calcium: 9.2 mg/dL (ref 8.9–10.3)
Chloride: 106 mmol/L (ref 98–111)
Creatinine: 0.68 mg/dL (ref 0.44–1.00)
GFR, Estimated: >60 mL/min (ref >60)
Glucose, Bld: 174 mg/dL — ABNORMAL HIGH (ref 70–99)
Potassium: 3.7 mmol/L (ref 3.5–5.1)
Sodium: 139 mmol/L (ref 135–145)
Total Bilirubin: 0.3 mg/dL (ref 0.3–1.2)
Total Protein: 6.8 g/dL (ref 6.5–8.1)

## 2020-09-28 LAB — CBC WITH DIFFERENTIAL (CANCER CENTER ONLY)
Abs Immature Granulocytes: 1.21 10*3/uL — ABNORMAL HIGH (ref 0.00–0.07)
Basophils Absolute: 0.1 10*3/uL (ref 0.0–0.1)
Basophils Relative: 1 %
Eosinophils Absolute: 0.1 10*3/uL (ref 0.0–0.5)
Eosinophils Relative: 0 %
HCT: 33.9 % — ABNORMAL LOW (ref 36.0–46.0)
Hemoglobin: 10.9 g/dL — ABNORMAL LOW (ref 12.0–15.0)
Immature Granulocytes: 6 %
Lymphocytes Relative: 8 %
Lymphs Abs: 1.7 10*3/uL (ref 0.7–4.0)
MCH: 26.8 pg (ref 26.0–34.0)
MCHC: 32.2 g/dL (ref 30.0–36.0)
MCV: 83.3 fL (ref 80.0–100.0)
Monocytes Absolute: 1.1 10*3/uL — ABNORMAL HIGH (ref 0.1–1.0)
Monocytes Relative: 5 %
Neutro Abs: 16.8 10*3/uL — ABNORMAL HIGH (ref 1.7–7.7)
Neutrophils Relative %: 80 %
Platelet Count: 156 10*3/uL (ref 150–400)
RBC: 4.07 MIL/uL (ref 3.87–5.11)
RDW: 22.5 % — ABNORMAL HIGH (ref 11.5–15.5)
WBC Count: 20.9 10*3/uL — ABNORMAL HIGH (ref 4.0–10.5)
nRBC: 0.2 % (ref 0.0–0.2)

## 2020-09-28 MED ORDER — HEPARIN SOD (PORK) LOCK FLUSH 100 UNIT/ML IV SOLN
500.0000 [IU] | Freq: Once | INTRAVENOUS | Status: AC | PRN
  Administered 2020-09-28: 500 [IU]
  Filled 2020-09-28: qty 5

## 2020-09-28 MED ORDER — PALONOSETRON HCL INJECTION 0.25 MG/5ML
0.2500 mg | Freq: Once | INTRAVENOUS | Status: AC
  Administered 2020-09-28: 0.25 mg via INTRAVENOUS

## 2020-09-28 MED ORDER — SODIUM CHLORIDE 0.9 % IV SOLN: 25.0000 mg | Freq: Once | INTRAVENOUS | Status: DC

## 2020-09-28 MED ORDER — DOXORUBICIN HCL CHEMO IV INJECTION 2 MG/ML
50.0000 mg/m2 | Freq: Once | INTRAVENOUS | Status: AC
  Administered 2020-09-28: 102 mg via INTRAVENOUS
  Filled 2020-09-28: qty 51

## 2020-09-28 MED ORDER — DIPHENHYDRAMINE HCL 50 MG/ML IJ SOLN
INTRAMUSCULAR | Status: AC
  Filled 2020-09-28: qty 1

## 2020-09-28 MED ORDER — SODIUM CHLORIDE 0.9 % IV SOLN
500.0000 mg/m2 | Freq: Once | INTRAVENOUS | Status: AC
  Administered 2020-09-28: 1020 mg via INTRAVENOUS
  Filled 2020-09-28: qty 51

## 2020-09-28 MED ORDER — SODIUM CHLORIDE 0.9% FLUSH
10.0000 mL | INTRAVENOUS | Status: DC | PRN
  Administered 2020-09-28: 10 mL
  Filled 2020-09-28: qty 10

## 2020-09-28 MED ORDER — DIPHENHYDRAMINE HCL 50 MG/ML IJ SOLN
25.0000 mg | Freq: Once | INTRAMUSCULAR | Status: AC
  Administered 2020-09-28: 25 mg via INTRAVENOUS

## 2020-09-28 MED ORDER — SODIUM CHLORIDE 0.9% FLUSH
10.0000 mL | Freq: Once | INTRAVENOUS | Status: AC
  Administered 2020-09-28: 10 mL
  Filled 2020-09-28: qty 10

## 2020-09-28 MED ORDER — PANTOPRAZOLE SODIUM 20 MG PO TBEC: 20.0000 mg | DELAYED_RELEASE_TABLET | Freq: Every day | ORAL | 1 refills | Status: DC

## 2020-09-28 MED ORDER — SODIUM CHLORIDE 0.9 % IV SOLN
150.0000 mg | Freq: Once | INTRAVENOUS | Status: AC
  Administered 2020-09-28: 150 mg via INTRAVENOUS
  Filled 2020-09-28: qty 150

## 2020-09-28 MED ORDER — SODIUM CHLORIDE 0.9 % IV SOLN
10.0000 mg | Freq: Once | INTRAVENOUS | Status: AC
  Administered 2020-09-28: 10 mg via INTRAVENOUS
  Filled 2020-09-28: qty 10

## 2020-09-28 MED ORDER — PALONOSETRON HCL INJECTION 0.25 MG/5ML
INTRAVENOUS | Status: AC
  Filled 2020-09-28: qty 5

## 2020-09-28 MED ORDER — VENLAFAXINE HCL ER 37.5 MG PO CP24: 37.5000 mg | ORAL_CAPSULE | Freq: Every day | ORAL | 6 refills | Status: DC

## 2020-09-28 MED ORDER — SODIUM CHLORIDE 0.9 % IV SOLN
Freq: Once | INTRAVENOUS | Status: AC
  Filled 2020-09-28: qty 250

## 2020-09-28 MED FILL — VENLAFAXINE HCL ER 37.5 MG: 37.5 | 30 days supply | Qty: 30 | Fill #0

## 2020-09-28 MED FILL — PANTOPRAZOLE SOD DR 20 MG T: 20 | 30 days supply | Qty: 30 | Fill #0

## 2020-09-28 NOTE — Progress Notes (Signed)
At 1125 during cytoxin infusion, patient began to complain of sensitivity to light, dizziness and sob.  Infusion was stopped and emergency protocol was placed.  Dr. Lindi Adie paged. See MAR.  Dr. Lindi Adie instructed to re-start infusion at a slower rate once symptoms resolved.  Patient reported she felt better and infusion was completed.

## 2020-09-28 NOTE — Assessment & Plan Note (Signed)
07/28/2020:Patient palpated a right breast mass for 1-2 years. Mammogram showed a 2.2cm mass at the 11 o'clock position with surrounding calcifications, 6.4cm in total extent, and up to 5 abnormal right axillary lymph nodes. 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  Treatment plan: 1.Neoadjuvantchemotherapy (MammaPrint testHigh Risk): AC foll by Taxol  2.mastectomy versus breast conserving surgery with targeted node dissection 3.Adjuvant radiation therapy 4.Follow-up adjuvant antiestrogen therapy [facility] 16070: Treatment of refractory nausea ------------------------------------------------------------------------------------------------------------------------------- Current Treatment: Cycle 3 day 1 Adriamycin and Cytoxan  Chemo toxicities: 1. Refractory nausea/vomiting: she is enrolled in [place] study. 2. bone pain due to Neulasta 3.  Alopecia Monitoring closely for toxicities. Labs reviewed  Return to clinic in 2 weeks for cycle 4

## 2020-09-28 NOTE — Research (Signed)
[id number] - TREATMENT OF REFRACTORY NAUSEA  09/28/20   The unused study medication was picked up from the patient at her office visit.  She provided a blister pack containing one remaining blue capsule in the section for for noon on day 1.    She states that she had mailed her forms in the envelope provided.  The patient was thanked for her time and participation in the research study.  Clabe Seal Clinical Research Coordinator I  09/28/20 9:15 AM

## 2020-09-28 NOTE — Patient Instructions (Signed)
[facility] Discharge Instructions for Patients Receiving Chemotherapy  Today you received the following chemotherapy agents Doxorubicin; Cyclophosphamide  To help prevent nausea and vomiting after your treatment, we encourage you to take your nausea medication as directed If you develop nausea and vomiting that is not controlled by your nausea medication, call the clinic.   BELOW ARE SYMPTOMS THAT SHOULD BE REPORTED IMMEDIATELY:  *FEVER GREATER THAN 100.5 F  *CHILLS WITH OR WITHOUT FEVER  NAUSEA AND VOMITING THAT IS NOT CONTROLLED WITH YOUR NAUSEA MEDICATION  *UNUSUAL SHORTNESS OF BREATH  *UNUSUAL BRUISING OR BLEEDING  TENDERNESS IN MOUTH AND THROAT WITH OR WITHOUT PRESENCE OF ULCERS  *URINARY PROBLEMS  *BOWEL PROBLEMS  UNUSUAL RASH Items with * indicate a potential emergency and should be followed up as soon as possible.  Feel free to call the clinic should you have any questions or concerns. The clinic phone number is [phone].  Please show the [facility] at check-in to the Emergency Department and triage nurse.

## 2020-09-29 ENCOUNTER — Encounter: Payer: Self-pay | Admitting: Emergency Medicine

## 2020-09-29 DIAGNOSIS — C50411 Malignant neoplasm of upper-outer quadrant of right female breast: Secondary | ICD-10-CM

## 2020-09-29 DIAGNOSIS — Z170 Estrogen receptor positive status [ER+]: Secondary | ICD-10-CM

## 2020-09-29 NOTE — Research (Signed)
[id number] - TREATMENT OF REFRACTORY NAUSEA  09/29/20   The unused study medication consisting of one blue capsule from the blister pack section for Noon on Day 1 was given to Raul Del, Pharmacist, for destruction according to site policy.      The Study Agent Return form has been completed.  Clabe Seal Clinical Research Coordinator I  09/29/20 9:49 AM

## 2020-09-30 ENCOUNTER — Telehealth: Payer: Self-pay

## 2020-09-30 ENCOUNTER — Inpatient Hospital Stay: Payer: Medicaid Other

## 2020-09-30 ENCOUNTER — Telehealth: Payer: Self-pay | Admitting: Hematology and Oncology

## 2020-09-30 ENCOUNTER — Other Ambulatory Visit: Payer: Self-pay

## 2020-09-30 ENCOUNTER — Encounter: Payer: Self-pay | Admitting: *Deleted

## 2020-09-30 VITALS — BP 140/83 | HR 86 | Temp 98.6°F | Resp 18 | Ht 64.0 in | Wt 201.2 lb

## 2020-09-30 DIAGNOSIS — C50411 Malignant neoplasm of upper-outer quadrant of right female breast: Secondary | ICD-10-CM

## 2020-09-30 DIAGNOSIS — Z170 Estrogen receptor positive status [ER+]: Secondary | ICD-10-CM

## 2020-09-30 DIAGNOSIS — Z5111 Encounter for antineoplastic chemotherapy: Secondary | ICD-10-CM | POA: Diagnosis not present

## 2020-09-30 MED ORDER — PEGFILGRASTIM-CBQV 6 MG/0.6ML ~~LOC~~ SOSY
6.0000 mg | PREFILLED_SYRINGE | Freq: Once | SUBCUTANEOUS | Status: AC
  Administered 2020-09-30: 6 mg via SUBCUTANEOUS

## 2020-09-30 MED ORDER — PEGFILGRASTIM-CBQV 6 MG/0.6ML ~~LOC~~ SOSY
PREFILLED_SYRINGE | SUBCUTANEOUS | Status: AC
  Filled 2020-09-30: qty 0.6

## 2020-09-30 NOTE — Patient Instructions (Signed)
Pegfilgrastim injection What is this medicine? PEGFILGRASTIM (PEG fil gra stim) is a long-acting granulocyte colony-stimulating factor that stimulates the growth of neutrophils, a type of white blood cell important in the body's fight against infection. It is used to reduce the incidence of fever and infection in patients with certain types of cancer who are receiving chemotherapy that affects the bone marrow, and to increase survival after being exposed to high doses of radiation. This medicine may be used for other purposes; ask your health care provider or pharmacist if you have questions. COMMON BRAND NAME(S): Fulphila, Neulasta, Nyvepria, UDENYCA, Ziextenzo What should I tell my health care provider before I take this medicine? They need to know if you have any of these conditions:  kidney disease  latex allergy  ongoing radiation therapy  sickle cell disease  skin reactions to acrylic adhesives (On-Body Injector only)  an unusual or allergic reaction to pegfilgrastim, filgrastim, other medicines, foods, dyes, or preservatives  pregnant or trying to get pregnant  breast-feeding How should I use this medicine? This medicine is for injection under the skin. If you get this medicine at home, you will be taught how to prepare and give the pre-filled syringe or how to use the On-body Injector. Refer to the patient Instructions for Use for detailed instructions. Use exactly as directed. Tell your healthcare provider immediately if you suspect that the On-body Injector may not have performed as intended or if you suspect the use of the On-body Injector resulted in a missed or partial dose. It is important that you put your used needles and syringes in a special sharps container. Do not put them in a trash can. If you do not have a sharps container, call your pharmacist or healthcare provider to get one. Talk to your pediatrician regarding the use of this medicine in children. While this drug  may be prescribed for selected conditions, precautions do apply. Overdosage: If you think you have taken too much of this medicine contact a poison control center or emergency room at once. NOTE: This medicine is only for you. Do not share this medicine with others. What if I miss a dose? It is important not to miss your dose. Call your doctor or health care professional if you miss your dose. If you miss a dose due to an On-body Injector failure or leakage, a new dose should be administered as soon as possible using a single prefilled syringe for manual use. What may interact with this medicine? Interactions have not been studied. This list may not describe all possible interactions. Give your health care provider a list of all the medicines, herbs, non-prescription drugs, or dietary supplements you use. Also tell them if you smoke, drink alcohol, or use illegal drugs. Some items may interact with your medicine. What should I watch for while using this medicine? Your condition will be monitored carefully while you are receiving this medicine. You may need blood work done while you are taking this medicine. Talk to your health care provider about your risk of cancer. You may be more at risk for certain types of cancer if you take this medicine. If you are going to need a MRI, CT scan, or other procedure, tell your doctor that you are using this medicine (On-Body Injector only). What side effects may I notice from receiving this medicine? Side effects that you should report to your doctor or health care professional as soon as possible:  allergic reactions (skin rash, itching or hives, swelling of   the face, lips, or tongue)  back pain  dizziness  fever  pain, redness, or irritation at site where injected  pinpoint red spots on the skin  red or dark-brown urine  shortness of breath or breathing problems  stomach or side pain, or pain at the shoulder  swelling  tiredness  trouble  passing urine or change in the amount of urine  unusual bruising or bleeding Side effects that usually do not require medical attention (report to your doctor or health care professional if they continue or are bothersome):  bone pain  muscle pain This list may not describe all possible side effects. Call your doctor for medical advice about side effects. You may report side effects to FDA at 1-800-FDA-1088. Where should I keep my medicine? Keep out of the reach of children. If you are using this medicine at home, you will be instructed on how to store it. Throw away any unused medicine after the expiration date on the label. NOTE: This sheet is a summary. It may not cover all possible information. If you have questions about this medicine, talk to your doctor, pharmacist, or health care provider.  2021 Elsevier/Gold Standard (2019-09-26 13:20:51)  

## 2020-09-30 NOTE — Telephone Encounter (Signed)
Scheduled per 1/11 los. Called pt and left a msg  °

## 2020-09-30 NOTE — Telephone Encounter (Signed)
Pt came in today for Udenyca injection. Pt. Stated she is feeling nausea and stated her antinausea medication was discontinued (zofran ) informed Pt. She has compazine to take. Pt. Stated that the compazine makes her sleepy and doesn't like taking it. TC to Dr. Geralyn Flash Nurse Kathlee Nations )to inquire about discontinuation of zofran. Per Kathlee Nations she doesn't see anything in the chart about why she can't take the zofran. Informed Pt. She can take the zofran and if the nausea does not get better to give a call back to the office. Pt verbalized understanding. No further problems or concerns noted.

## 2020-10-05 ENCOUNTER — Ambulatory Visit: Payer: Medicaid Other | Admitting: Gastroenterology

## 2020-10-11 MED FILL — Dexamethasone Sodium Phosphate Inj 100 MG/10ML: INTRAMUSCULAR | Qty: 1 | Status: AC

## 2020-10-11 NOTE — Progress Notes (Signed)
Patient Care Team: Patient, [name] as PCP - General (General Practice) Rockwell Germany, RN as Oncology Nurse Navigator Mauro Kaufmann, RN as Oncology Nurse Navigator [name], MD as Consulting Physician (General Surgery) Nicholas Lose, MD as Consulting Physician (Hematology and Oncology) Gery Pray, MD as Consulting Physician (Radiation Oncology)  DIAGNOSIS:    ICD-10-CM   1. Malignant neoplasm of upper-outer quadrant of right breast in female, estrogen receptor positive ([place])  C50.411    Z17.0     SUMMARY OF ONCOLOGIC HISTORY: Oncology History  Malignant neoplasm of upper-outer quadrant of right breast in female, estrogen receptor positive ([place])  07/28/2020 Initial Diagnosis   Patient palpated a right breast mass for 1-2 years. Mammogram showed a 2.2cm mass at the 11 o'clock position with surrounding calcifications, 6.4cm in total extent, and up to 5 abnormal right axillary lymph nodes. 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    08/05/2020 Miscellaneous   MammaPrint: High risk luminal type B   08/11/2020 Genetic Testing   Negative genetic testing: no pathogenic variants detected in [facility] Breast Cancer STAT Panel or Common Hereditary Cancers panel. The report dates are August 11, 2020 and August 19, 2020, respectively. Two variants of uncertain signficance were detected - one in the CTNNA1 gene called c.86del and the second in the MLH1 gene called c.808A>G.   The STAT Breast cancer panel offered by [facility] includes sequencing and rearrangement analysis for the following 9 genes:  ATM, BRCA1, BRCA2, CDH1, CHEK2, PALB2, PTEN, STK11 and TP53.  The Common Hereditary Cancers Panel offered by [facility] includes sequencing and/or deletion duplication testing of the following 48 genes: APC, ATM, AXIN2, BARD1, BMPR1A, BRCA1, BRCA2, BRIP1,  CDH1, CDK4, CDKN2A (p14ARF), CDKN2A (p16INK4a), CHEK2, CTNNA1, DICER1, EPCAM (Deletion/duplication testing only), GREM1 (promoter region deletion/duplication testing only), KIT, MEN1, MLH1, MSH2, MSH3, MSH6, MUTYH, NBN, NF1, NTHL1, PALB2, PDGFRA, PMS2, POLD1, POLE, PTEN, RAD50, RAD51C, RAD51D, RNF43, SDHB, SDHC, SDHD, SMAD4, SMARCA4. STK11, TP53, TSC1, TSC2, and VHL.  The following genes were evaluated for sequence changes only: SDHA and HOXB13 c.251G>A variant only.    09/01/2020 -  Chemotherapy    Patient is on Treatment Plan: BREAST DOSE DENSE AC Q14D / CARBOPLATIN D1 + PACLITAXEL D1,8,15 Q21D        CHIEF COMPLIANT: Cycle 4Day 1Adriamycin and Cytoxan  INTERVAL HISTORY: Melody Rodriguez is a 45 y.o. with above-mentioned history of right breast cancercurrently on neoadjuvant chemotherapy with dose dense Adriamycin and Cytoxan.She presents to the clinic todayfortoxicity check andcycle 4.  Towards the end of last cycle of chemotherapy she felt dizzy lightheaded and did not feel well.  Treatment was slowed down and she was given Benadryl.  She finally recovered from that.  She does have very mild nausea but I think most of it is anticipatory nausea.  At home she has been doing quite well.  ALLERGIES:  is allergic to latex.  MEDICATIONS:  Current Outpatient Medications  Medication Sig Dispense Refill  . cyclobenzaprine (FLEXERIL) 5 MG tablet Take 1 tablet (5 mg total) by mouth 3 (three) times daily as needed for muscle spasms. 20 tablet 0  . Investigational dexamethasone 4 MG tablet [facility] 16070 Blister Card 2 Take 2 tablets by mouth daily. Take in the morning on Days 2-4. 6 tablet 0  . Investigational olanzapine/placebo 10 MG capsule [facility] 16070 Blister Card 2  Take 1 capsule by mouth daily. Take in the morning on Days 2-4. 3 capsule 0  . Investigational prochlorperazine maleate/placebo 10 MG capsule [facility] 16070 Blister Card 2 Take 1 capsule by mouth every 8 (eight) hours. Take on Days 1-4. 11  capsule 0  . lidocaine-prilocaine (EMLA) cream Apply to affected area once 30 g 3  . loratadine (CLARITIN) 10 MG tablet Take 10 mg by mouth daily.    . ondansetron (ZOFRAN) 8 MG tablet Take 1 tablet (8 mg total) by mouth 2 (two) times daily as needed. Start on the third day after chemotherapy. 30 tablet 1  . pantoprazole (PROTONIX) 20 MG tablet Take 1 tablet (20 mg total) by mouth daily. 30 tablet 1  . venlafaxine XR (EFFEXOR-XR) 37.5 MG 24 hr capsule Take 1 capsule (37.5 mg total) by mouth daily with breakfast. 30 capsule 6   No current facility-administered medications for this visit.   Facility-Administered Medications Ordered in Other Visits  Medication Dose Route Frequency Provider Last Rate Last Admin  . cyclophosphamide (CYTOXAN) 1,020 mg in sodium chloride 0.9 % 500 mL chemo infusion  500 mg/m2 (Treatment Plan Recorded) Intravenous Once Nicholas Lose, MD      . DOXOrubicin (ADRIAMYCIN) chemo injection 102 mg  50 mg/m2 (Treatment Plan Recorded) Intravenous Once Nicholas Lose, MD      . famotidine (PEPCID) IVPB 20 mg premix  20 mg Intravenous Once Nicholas Lose, MD      . fosaprepitant (EMEND) 150 mg in sodium chloride 0.9 % 145 mL IVPB  150 mg Intravenous Once Nicholas Lose, MD 450 mL/hr at 10/12/20 1246 150 mg at 10/12/20 1246  . heparin lock flush 100 unit/mL  500 Units Intracatheter Once PRN Nicholas Lose, MD      . sodium chloride flush (NS) 0.9 % injection 10 mL  10 mL Intracatheter PRN Nicholas Lose, MD        PHYSICAL EXAMINATION: ECOG PERFORMANCE STATUS: 1 - Symptomatic but completely ambulatory  Vitals:   10/12/20 1135  BP: 122/76  Pulse: 83  Resp: 18  Temp: 98.1 F (36.7 C)  SpO2: 100%   Filed Weights   10/12/20 1135  Weight: 200 lb 3.2 oz (90.8 kg)     LABORATORY DATA:  I have reviewed the data as listed CMP Latest Ref Rng & Units 10/12/2020 09/28/2020 09/14/2020  Glucose 70 - 99 mg/dL 178(H) 174(H) 133(H)  BUN 6 - 20 mg/dL _0 Creatinine 0.44 - 1.00  mg/dL 0.66 0.68 0.66  Sodium 135 - 145 mmol/L 141 139 141  Potassium 3.5 - 5.1 mmol/L 3.7 3.7 4.0  Chloride 98 - 111 mmol/L 107 106 110  CO2 22 - 32 mmol/L _1 Calcium 8.9 - 10.3 mg/dL 8.8(L) 9.2 8.7(L)  Total Protein 6.5 - 8.1 g/dL 6.9 6.8 6.4(L)  Total Bilirubin 0.3 - 1.2 mg/dL 0.2(L) 0.3 <0.2(L)  Alkaline Phos 38 - 126 U/L 104 102 63  AST 15 - 41 U/L 11(L) 10(L) 14(L)  ALT 0 - 44 U/L _2 Lab Results  Component Value Date   WBC 20.6 (H) 10/12/2020   HGB 10.4 (L) 10/12/2020   HCT 31.3 (L) 10/12/2020   MCV 83.9 10/12/2020   PLT 158 10/12/2020   NEUTROABS 16.1 (H) 10/12/2020    ASSESSMENT & PLAN:  Malignant neoplasm of upper-outer quadrant of right breast in female, estrogen receptor positive ([place]) 07/28/2020:Patient palpated a right breast mass for 1-2 years. Mammogram showed a 2.2cm mass at  the 11 o'clock position with surrounding calcifications, 6.4cm in total extent, and up to 5 abnormal right axillary lymph nodes. 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  Treatment plan: 1.Neoadjuvantchemotherapy (MammaPrint testHigh Risk): AC foll by Taxol  2.mastectomy versus breast conserving surgery with targeted node dissection 3.Adjuvant radiation therapy 4.Follow-up adjuvant antiestrogen therapy [facility] 16070: Treatment of refractory nausea ------------------------------------------------------------------------------------------------------------------------------- Current Treatment: Cycle4day 1Adriamycin and Cytoxan  Chemo toxicities: 1. Refractory nausea/vomiting: she is enrolled in [place] study.  No further nausea issues 2. bone pain/spasms due to Neulasta: Much better 3.  Alopecia 4. stomach upset: Sent prescription for Protonix 5. severe hot flashes: Slightly better on Effexor 6.  Elevated blood sugar: I  discontinued dexamethasone  Monitoring closely for toxicities. Labs reviewed  Return to clinic in 2 weeks for cycle 1 Taxol    No orders of the defined types were placed in this encounter.  The patient has a good understanding of the overall plan. she agrees with it. she will call with any problems that may develop before the next visit here.  Total time spent: 30 mins including face to face time and time spent for planning, charting and coordination of care  Nicholas Lose, MD 10/12/2020  I, Cloyde Reams Dorshimer, am acting as scribe for Dr. Nicholas Lose.  I have reviewed the above documentation for accuracy and completeness, and I agree with the above.

## 2020-10-12 ENCOUNTER — Other Ambulatory Visit: Payer: Self-pay

## 2020-10-12 ENCOUNTER — Inpatient Hospital Stay: Payer: Medicaid Other

## 2020-10-12 ENCOUNTER — Encounter: Payer: Self-pay | Admitting: Licensed Clinical Social Worker

## 2020-10-12 ENCOUNTER — Inpatient Hospital Stay (HOSPITAL_BASED_OUTPATIENT_CLINIC_OR_DEPARTMENT_OTHER): Payer: Medicaid Other | Admitting: Hematology and Oncology

## 2020-10-12 ENCOUNTER — Encounter: Payer: Self-pay | Admitting: Emergency Medicine

## 2020-10-12 DIAGNOSIS — C50411 Malignant neoplasm of upper-outer quadrant of right female breast: Secondary | ICD-10-CM | POA: Diagnosis not present

## 2020-10-12 DIAGNOSIS — Z452 Encounter for adjustment and management of vascular access device: Secondary | ICD-10-CM

## 2020-10-12 DIAGNOSIS — Z170 Estrogen receptor positive status [ER+]: Secondary | ICD-10-CM | POA: Diagnosis not present

## 2020-10-12 DIAGNOSIS — D500 Iron deficiency anemia secondary to blood loss (chronic): Secondary | ICD-10-CM

## 2020-10-12 DIAGNOSIS — Z5111 Encounter for antineoplastic chemotherapy: Secondary | ICD-10-CM | POA: Diagnosis not present

## 2020-10-12 LAB — CBC WITH DIFFERENTIAL (CANCER CENTER ONLY)
Abs Immature Granulocytes: 1.28 10*3/uL — ABNORMAL HIGH (ref 0.00–0.07)
Basophils Absolute: 0.2 10*3/uL — ABNORMAL HIGH (ref 0.0–0.1)
Basophils Relative: 1 %
Eosinophils Absolute: 0.1 10*3/uL (ref 0.0–0.5)
Eosinophils Relative: 1 %
HCT: 31.3 % — ABNORMAL LOW (ref 36.0–46.0)
Hemoglobin: 10.4 g/dL — ABNORMAL LOW (ref 12.0–15.0)
Immature Granulocytes: 6 %
Lymphocytes Relative: 8 %
Lymphs Abs: 1.7 10*3/uL (ref 0.7–4.0)
MCH: 27.9 pg (ref 26.0–34.0)
MCHC: 33.2 g/dL (ref 30.0–36.0)
MCV: 83.9 fL (ref 80.0–100.0)
Monocytes Absolute: 1.2 10*3/uL — ABNORMAL HIGH (ref 0.1–1.0)
Monocytes Relative: 6 %
Neutro Abs: 16.1 10*3/uL — ABNORMAL HIGH (ref 1.7–7.7)
Neutrophils Relative %: 78 %
Platelet Count: 158 10*3/uL (ref 150–400)
RBC: 3.73 MIL/uL — ABNORMAL LOW (ref 3.87–5.11)
RDW: 23 % — ABNORMAL HIGH (ref 11.5–15.5)
WBC Count: 20.6 10*3/uL — ABNORMAL HIGH (ref 4.0–10.5)
nRBC: 0.2 % (ref 0.0–0.2)

## 2020-10-12 LAB — CMP (CANCER CENTER ONLY)
ALT: 14 U/L (ref 0–44)
AST: 11 U/L — ABNORMAL LOW (ref 15–41)
Albumin: 3.8 g/dL (ref 3.5–5.0)
Alkaline Phosphatase: 104 U/L (ref 38–126)
Anion gap: 11 (ref 5–15)
BUN: 9 mg/dL (ref 6–20)
CO2: 23 mmol/L (ref 22–32)
Calcium: 8.8 mg/dL — ABNORMAL LOW (ref 8.9–10.3)
Chloride: 107 mmol/L (ref 98–111)
Creatinine: 0.66 mg/dL (ref 0.44–1.00)
GFR, Estimated: >60 mL/min (ref >60)
Glucose, Bld: 178 mg/dL — ABNORMAL HIGH (ref 70–99)
Potassium: 3.7 mmol/L (ref 3.5–5.1)
Sodium: 141 mmol/L (ref 135–145)
Total Bilirubin: 0.2 mg/dL — ABNORMAL LOW (ref 0.3–1.2)
Total Protein: 6.9 g/dL (ref 6.5–8.1)

## 2020-10-12 MED ORDER — DOXORUBICIN HCL CHEMO IV INJECTION 2 MG/ML
50.0000 mg/m2 | Freq: Once | INTRAVENOUS | Status: AC
  Administered 2020-10-12: 102 mg via INTRAVENOUS
  Filled 2020-10-12: qty 51

## 2020-10-12 MED ORDER — SODIUM CHLORIDE 0.9% FLUSH
10.0000 mL | Freq: Once | INTRAVENOUS | Status: AC
  Administered 2020-10-12: 10 mL
  Filled 2020-10-12: qty 10

## 2020-10-12 MED ORDER — FAMOTIDINE IN NACL 20-0.9 MG/50ML-% IV SOLN
20.0000 mg | Freq: Once | INTRAVENOUS | Status: AC
  Administered 2020-10-12: 20 mg via INTRAVENOUS

## 2020-10-12 MED ORDER — DIPHENHYDRAMINE HCL 50 MG/ML IJ SOLN
50.0000 mg | Freq: Once | INTRAMUSCULAR | Status: AC
  Administered 2020-10-12: 50 mg via INTRAVENOUS

## 2020-10-12 MED ORDER — DIPHENHYDRAMINE HCL 50 MG/ML IJ SOLN
INTRAMUSCULAR | Status: AC
  Filled 2020-10-12: qty 1

## 2020-10-12 MED ORDER — PALONOSETRON HCL INJECTION 0.25 MG/5ML
0.2500 mg | Freq: Once | INTRAVENOUS | Status: AC
Start: 2020-10-12 — End: 2020-10-12
  Administered 2020-10-12: 0.25 mg via INTRAVENOUS

## 2020-10-12 MED ORDER — SODIUM CHLORIDE 0.9 % IV SOLN
500.0000 mg/m2 | Freq: Once | INTRAVENOUS | Status: AC
  Administered 2020-10-12: 1020 mg via INTRAVENOUS
  Filled 2020-10-12: qty 51

## 2020-10-12 MED ORDER — PALONOSETRON HCL INJECTION 0.25 MG/5ML
INTRAVENOUS | Status: AC
  Filled 2020-10-12: qty 5

## 2020-10-12 MED ORDER — SODIUM CHLORIDE 0.9 % IV SOLN
Freq: Once | INTRAVENOUS | Status: AC
  Filled 2020-10-12: qty 250

## 2020-10-12 MED ORDER — ACETAMINOPHEN 325 MG PO TABS
ORAL_TABLET | ORAL | Status: AC
  Filled 2020-10-12: qty 2

## 2020-10-12 MED ORDER — SODIUM CHLORIDE 0.9% FLUSH
10.0000 mL | INTRAVENOUS | Status: DC | PRN
  Administered 2020-10-12: 10 mL
  Filled 2020-10-12: qty 10

## 2020-10-12 MED ORDER — HEPARIN SOD (PORK) LOCK FLUSH 100 UNIT/ML IV SOLN
500.0000 [IU] | Freq: Once | INTRAVENOUS | Status: AC | PRN
  Administered 2020-10-12: 500 [IU]
  Filled 2020-10-12: qty 5

## 2020-10-12 MED ORDER — FAMOTIDINE IN NACL 20-0.9 MG/50ML-% IV SOLN
INTRAVENOUS | Status: AC
  Filled 2020-10-12: qty 50

## 2020-10-12 MED ORDER — SODIUM CHLORIDE 0.9 % IV SOLN
150.0000 mg | Freq: Once | INTRAVENOUS | Status: AC
  Administered 2020-10-12: 150 mg via INTRAVENOUS
  Filled 2020-10-12: qty 150

## 2020-10-12 MED ORDER — ACETAMINOPHEN 325 MG PO TABS
650.0000 mg | ORAL_TABLET | Freq: Once | ORAL | Status: AC
  Administered 2020-10-12: 650 mg via ORAL

## 2020-10-12 NOTE — Progress Notes (Signed)
[place] CSW Progress Note  Clinical Social Worker attempted to see patient in infusion for ongoing support. Patient sleeping. Left next [employer] with pt's belongings. Patient was also approved for assistance from the [facility] on 10/01/2020.    Christeen Douglas , LCSW

## 2020-10-12 NOTE — Assessment & Plan Note (Signed)
07/28/2020:Patient palpated a right breast mass for 1-2 years. Mammogram showed a 2.2cm mass at the 11 o'clock position with surrounding calcifications, 6.4cm in total extent, and up to 5 abnormal right axillary lymph nodes. 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  Treatment plan: 1.Neoadjuvantchemotherapy (MammaPrint testHigh Risk): AC foll by Taxol  2.mastectomy versus breast conserving surgery with targeted node dissection 3.Adjuvant radiation therapy 4.Follow-up adjuvant antiestrogen therapy URCC 16070: Treatment of refractory nausea ------------------------------------------------------------------------------------------------------------------------------- Current Treatment: Cycle4day 1Adriamycin and Cytoxan  Chemo toxicities: 1. Refractory nausea/vomiting: she is enrolled in [place] study.  No further nausea issues 2. bone pain/spasms due to Neulasta: Much better 3.  Alopecia 4. stomach upset: Sent prescription for Protonix 5. severe hot flashes: Sent a prescription for Effexor 6.  Elevated blood sugar: I discontinued dexamethasone  Monitoring closely for toxicities. Labs reviewed  Return to clinic in 2 weeks for cycle 1 Taxol

## 2020-10-12 NOTE — Progress Notes (Signed)
Received orders to add Pepcid 20 mg IVPB and Benadryl 50 mg IVPush due to reaction occurring during cyclophosphamide infusion.  Slow cyclophosphamide infusion to 90 minutes.  T.O. Dr Arnell Asal, RN/Nichalos Brenton Ronnald Ramp, PharmD  .Wynona Neat, [facility]

## 2020-10-12 NOTE — Patient Instructions (Signed)
[facility] Discharge Instructions for Patients Receiving Chemotherapy  Today you received the following chemotherapy agents Doxorubicin; Cyclophosphamide  To help prevent nausea and vomiting after your treatment, we encourage you to take your nausea medication as directed If you develop nausea and vomiting that is not controlled by your nausea medication, call the clinic.   BELOW ARE SYMPTOMS THAT SHOULD BE REPORTED IMMEDIATELY:  *FEVER GREATER THAN 100.5 F  *CHILLS WITH OR WITHOUT FEVER  NAUSEA AND VOMITING THAT IS NOT CONTROLLED WITH YOUR NAUSEA MEDICATION  *UNUSUAL SHORTNESS OF BREATH  *UNUSUAL BRUISING OR BLEEDING  TENDERNESS IN MOUTH AND THROAT WITH OR WITHOUT PRESENCE OF ULCERS  *URINARY PROBLEMS  *BOWEL PROBLEMS  UNUSUAL RASH Items with * indicate a potential emergency and should be followed up as soon as possible.  Feel free to call the clinic should you have any questions or concerns. The clinic phone number is [phone].  Please show the CHEMO ALERT CARD at check-in to the Emergency Department and triage nurse.   

## 2020-10-12 NOTE — Progress Notes (Signed)
Patient complaining of headache, reports she gets headaches often. Sandi Mealy, PA, notified. Verbal order 650mg  Tylenol. She denies SOB or any other symptoms. States that this does not feel like the reaction she had during her previous Cytoxan infusion.

## 2020-10-13 ENCOUNTER — Telehealth: Payer: Self-pay | Admitting: Oncology

## 2020-10-13 NOTE — Telephone Encounter (Signed)
No 1/25 los. No changes made to pt's schedule.  

## 2020-10-14 ENCOUNTER — Ambulatory Visit: Payer: Medicaid Other | Admitting: Hematology and Oncology

## 2020-10-14 ENCOUNTER — Other Ambulatory Visit: Payer: Medicaid Other

## 2020-10-14 ENCOUNTER — Other Ambulatory Visit: Payer: Self-pay

## 2020-10-14 ENCOUNTER — Encounter: Payer: Self-pay | Admitting: *Deleted

## 2020-10-14 ENCOUNTER — Inpatient Hospital Stay: Payer: Medicaid Other

## 2020-10-14 ENCOUNTER — Ambulatory Visit: Payer: Medicaid Other

## 2020-10-14 ENCOUNTER — Other Ambulatory Visit: Payer: Self-pay | Admitting: Medical

## 2020-10-14 ENCOUNTER — Inpatient Hospital Stay (HOSPITAL_BASED_OUTPATIENT_CLINIC_OR_DEPARTMENT_OTHER): Payer: Medicaid Other | Admitting: Medical

## 2020-10-14 VITALS — BP 113/71 | HR 88 | Temp 99.1°F | Resp 18

## 2020-10-14 DIAGNOSIS — R110 Nausea: Secondary | ICD-10-CM

## 2020-10-14 DIAGNOSIS — C50411 Malignant neoplasm of upper-outer quadrant of right female breast: Secondary | ICD-10-CM

## 2020-10-14 DIAGNOSIS — Z170 Estrogen receptor positive status [ER+]: Secondary | ICD-10-CM

## 2020-10-14 DIAGNOSIS — Z5111 Encounter for antineoplastic chemotherapy: Secondary | ICD-10-CM | POA: Diagnosis not present

## 2020-10-14 MED ORDER — ONDANSETRON HCL 8 MG PO TABS
ORAL_TABLET | ORAL | Status: AC
  Filled 2020-10-14: qty 1

## 2020-10-14 MED ORDER — PEGFILGRASTIM-CBQV 6 MG/0.6ML ~~LOC~~ SOSY
6.0000 mg | PREFILLED_SYRINGE | Freq: Once | SUBCUTANEOUS | Status: AC
  Administered 2020-10-14: 6 mg via SUBCUTANEOUS

## 2020-10-14 MED ORDER — ONDANSETRON HCL 8 MG PO TABS: 8.0000 mg | ORAL_TABLET | Freq: Two times a day (BID) | ORAL | 2 refills | Status: DC | PRN

## 2020-10-14 MED ORDER — ONDANSETRON HCL 8 MG PO TABS
8.0000 mg | ORAL_TABLET | Freq: Once | ORAL | Status: AC
  Administered 2020-10-14: 8 mg via ORAL

## 2020-10-14 MED ORDER — PEGFILGRASTIM-CBQV 6 MG/0.6ML ~~LOC~~ SOSY
PREFILLED_SYRINGE | SUBCUTANEOUS | Status: AC
  Filled 2020-10-14: qty 0.6

## 2020-10-14 MED FILL — ONDANSETRON HCL 8 MG TABLET: 8 | 15 days supply | Qty: 30 | Fill #0

## 2020-10-14 NOTE — Patient Instructions (Signed)
Pegfilgrastim injection What is this medicine? PEGFILGRASTIM (PEG fil gra stim) is a long-acting granulocyte colony-stimulating factor that stimulates the growth of neutrophils, a type of white blood cell important in the body's fight against infection. It is used to reduce the incidence of fever and infection in patients with certain types of cancer who are receiving chemotherapy that affects the bone marrow, and to increase survival after being exposed to high doses of radiation. This medicine may be used for other purposes; ask your health care provider or pharmacist if you have questions. COMMON BRAND NAME(S): Fulphila, Neulasta, Nyvepria, UDENYCA, Ziextenzo What should I tell my health care provider before I take this medicine? They need to know if you have any of these conditions:  kidney disease  latex allergy  ongoing radiation therapy  sickle cell disease  skin reactions to acrylic adhesives (On-Body Injector only)  an unusual or allergic reaction to pegfilgrastim, filgrastim, other medicines, foods, dyes, or preservatives  pregnant or trying to get pregnant  breast-feeding How should I use this medicine? This medicine is for injection under the skin. If you get this medicine at home, you will be taught how to prepare and give the pre-filled syringe or how to use the On-body Injector. Refer to the patient Instructions for Use for detailed instructions. Use exactly as directed. Tell your healthcare provider immediately if you suspect that the On-body Injector may not have performed as intended or if you suspect the use of the On-body Injector resulted in a missed or partial dose. It is important that you put your used needles and syringes in a special sharps container. Do not put them in a trash can. If you do not have a sharps container, call your pharmacist or healthcare provider to get one. Talk to your pediatrician regarding the use of this medicine in children. While this drug  may be prescribed for selected conditions, precautions do apply. Overdosage: If you think you have taken too much of this medicine contact a poison control center or emergency room at once. NOTE: This medicine is only for you. Do not share this medicine with others. What if I miss a dose? It is important not to miss your dose. Call your doctor or health care professional if you miss your dose. If you miss a dose due to an On-body Injector failure or leakage, a new dose should be administered as soon as possible using a single prefilled syringe for manual use. What may interact with this medicine? Interactions have not been studied. This list may not describe all possible interactions. Give your health care provider a list of all the medicines, herbs, non-prescription drugs, or dietary supplements you use. Also tell them if you smoke, drink alcohol, or use illegal drugs. Some items may interact with your medicine. What should I watch for while using this medicine? Your condition will be monitored carefully while you are receiving this medicine. You may need blood work done while you are taking this medicine. Talk to your health care provider about your risk of cancer. You may be more at risk for certain types of cancer if you take this medicine. If you are going to need a MRI, CT scan, or other procedure, tell your doctor that you are using this medicine (On-Body Injector only). What side effects may I notice from receiving this medicine? Side effects that you should report to your doctor or health care professional as soon as possible:  allergic reactions (skin rash, itching or hives, swelling of   the face, lips, or tongue)  back pain  dizziness  fever  pain, redness, or irritation at site where injected  pinpoint red spots on the skin  red or dark-brown urine  shortness of breath or breathing problems  stomach or side pain, or pain at the shoulder  swelling  tiredness  trouble  passing urine or change in the amount of urine  unusual bruising or bleeding Side effects that usually do not require medical attention (report to your doctor or health care professional if they continue or are bothersome):  bone pain  muscle pain This list may not describe all possible side effects. Call your doctor for medical advice about side effects. You may report side effects to FDA at 1-800-FDA-1088. Where should I keep my medicine? Keep out of the reach of children. If you are using this medicine at home, you will be instructed on how to store it. Throw away any unused medicine after the expiration date on the label. NOTE: This sheet is a summary. It may not cover all possible information. If you have questions about this medicine, talk to your doctor, pharmacist, or health care provider.  2021 Elsevier/Gold Standard (2019-09-26 13:20:51)  

## 2020-10-14 NOTE — Progress Notes (Signed)
While in flush room, patient reports feeling nauseous since Tuesday, she denies emesis but reports decreased appetite. Patient states she feels warm and has the chills but reports that she also gets hot flashes and thinks this could be one. Lucianne Lei, PA, notified of patient's symptoms.  Lucianne Lei visited patient in flush room and reported she was safe to go home. Patient was ambulatory and with no complaints upon discharge.

## 2020-10-18 NOTE — Progress Notes (Signed)
The patient was seen in the flush room today as she presented to receive her Udenyca injection.  She reports that she had continued nausea despite her use of Zofran.  She denied fevers, chills, or sweats.  She did report having subjective hot flashes.  She is given Zofran 8 mg p.o. x1 and then was released home.  Sandi Mealy, MHS, PA-C Physician Assistant

## 2020-10-25 NOTE — Progress Notes (Signed)
Patient Care Team: Patient, [name] as PCP - General (General Practice) Rockwell Germany, RN as Oncology Nurse Navigator Mauro Kaufmann, RN as Oncology Nurse Navigator [name], MD as Consulting Physician (General Surgery) Nicholas Lose, MD as Consulting Physician (Hematology and Oncology) Gery Pray, MD as Consulting Physician (Radiation Oncology)  DIAGNOSIS:    ICD-10-CM   1. Malignant neoplasm of upper-outer quadrant of right breast in female, estrogen receptor positive ([place])  C50.411    Z17.0     SUMMARY OF ONCOLOGIC HISTORY: Oncology History  Malignant neoplasm of upper-outer quadrant of right breast in female, estrogen receptor positive ([place])  07/28/2020 Initial Diagnosis   Patient palpated a right breast mass for 1-2 years. Mammogram showed a 2.2cm mass at the 11 o'clock position with surrounding calcifications, 6.4cm in total extent, and up to 5 abnormal right axillary lymph nodes. 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    08/05/2020 Miscellaneous   MammaPrint: High risk luminal type B   08/11/2020 Genetic Testing   Negative genetic testing: no pathogenic variants detected in [facility] Breast Cancer STAT Panel or Common Hereditary Cancers panel. The report dates are August 11, 2020 and August 19, 2020, respectively. Two variants of uncertain signficance were detected - one in the CTNNA1 gene called c.86del and the second in the MLH1 gene called c.808A>G.   The STAT Breast cancer panel offered by [facility] includes sequencing and rearrangement analysis for the following 9 genes:  ATM, BRCA1, BRCA2, CDH1, CHEK2, PALB2, PTEN, STK11 and TP53.  The Common Hereditary Cancers Panel offered by [facility] includes sequencing and/or deletion duplication testing of the following 48 genes: APC, ATM, AXIN2, BARD1, BMPR1A, BRCA1, BRCA2, BRIP1,  CDH1, CDK4, CDKN2A (p14ARF), CDKN2A (p16INK4a), CHEK2, CTNNA1, DICER1, EPCAM (Deletion/duplication testing only), GREM1 (promoter region deletion/duplication testing only), KIT, MEN1, MLH1, MSH2, MSH3, MSH6, MUTYH, NBN, NF1, NTHL1, PALB2, PDGFRA, PMS2, POLD1, POLE, PTEN, RAD50, RAD51C, RAD51D, RNF43, SDHB, SDHC, SDHD, SMAD4, SMARCA4. STK11, TP53, TSC1, TSC2, and VHL.  The following genes were evaluated for sequence changes only: SDHA and HOXB13 c.251G>A variant only.    09/01/2020 -  Chemotherapy    Patient is on Treatment Plan: BREAST DOSE DENSE AC Q14D / CARBOPLATIN D1 + PACLITAXEL D1,8,15 Q21D        CHIEF COMPLIANT: Cycle1Taxol  INTERVAL HISTORY: Nitzia Perren is a 45 y.o. with above-mentioned history of right breast cancercurrently on neoadjuvant chemotherapy with weekly Taxol after completing four cycles of dose dense Adriamycin and Cytoxan.She presents to the clinic todayfortoxicity checkandcycle1. She felt miserable after the last cycle of chemo.lasted 5 days. Anticipatory nausea.  ALLERGIES:  is allergic to latex.  MEDICATIONS:  Current Outpatient Medications  Medication Sig Dispense Refill  . cyclobenzaprine (FLEXERIL) 5 MG tablet Take 1 tablet (5 mg total) by mouth 3 (three) times daily as needed for muscle spasms. 20 tablet 0  . Investigational dexamethasone 4 MG tablet [facility] 16070 Blister Card 2 Take 2 tablets by mouth daily. Take in the morning on Days 2-4. 6 tablet 0  . Investigational olanzapine/placebo 10 MG capsule [facility] 16070 Blister Card 2 Take 1 capsule by mouth daily. Take in the morning on Days 2-4. 3 capsule 0  . Investigational prochlorperazine maleate/placebo 10 MG capsule [facility]RCC 16070 Blister Card 2 Take 1 capsule by mouth every 8 (eight) hours. Take on Days 1-4. 11 capsule 0  .  lidocaine-prilocaine (EMLA) cream Apply to affected area once 30 g 3  . loratadine (CLARITIN) 10 MG tablet Take 10 mg by mouth daily.    . ondansetron (ZOFRAN) 8 MG tablet Take 1 tablet  (8 mg total) by mouth 2 (two) times daily as needed. Start on the third day after chemotherapy. 30 tablet 2  . pantoprazole (PROTONIX) 20 MG tablet Take 1 tablet (20 mg total) by mouth daily. 30 tablet 1  . venlafaxine XR (EFFEXOR-XR) 37.5 MG 24 hr capsule Take 1 capsule (37.5 mg total) by mouth daily with breakfast. 30 capsule 6   No current facility-administered medications for this visit.    PHYSICAL EXAMINATION: ECOG PERFORMANCE STATUS: 2 - Symptomatic, <50% confined to bed  Vitals:   10/26/20 0832  BP: 119/83  Pulse: 78  Resp: 17  Temp: 98.1 F (36.7 C)  SpO2: 100%   Filed Weights   10/26/20 0832  Weight: 201 lb 1.6 oz (91.2 kg)     LABORATORY DATA:  I have reviewed the data as listed CMP Latest Ref Rng & Units 10/12/2020 09/28/2020 09/14/2020  Glucose 70 - 99 mg/dL 178(H) 174(H) 133(H)  BUN 6 - 20 mg/dL _0 Creatinine 0.44 - 1.00 mg/dL 0.66 0.68 0.66  Sodium 135 - 145 mmol/L 141 139 141  Potassium 3.5 - 5.1 mmol/L 3.7 3.7 4.0  Chloride 98 - 111 mmol/L 107 106 110  CO2 22 - 32 mmol/L _1 Calcium 8.9 - 10.3 mg/dL 8.8(L) 9.2 8.7(L)  Total Protein 6.5 - 8.1 g/dL 6.9 6.8 6.4(L)  Total Bilirubin 0.3 - 1.2 mg/dL 0.2(L) 0.3 <0.2(L)  Alkaline Phos 38 - 126 U/L 104 102 63  AST 15 - 41 U/L 11(L) 10(L) 14(L)  ALT 0 - 44 U/L _2 Lab Results  Component Value Date   WBC 12.0 (H) 10/26/2020   HGB 10.3 (L) 10/26/2020   HCT 32.1 (L) 10/26/2020   MCV 87.7 10/26/2020   PLT 150 10/26/2020   NEUTROABS 8.6 (H) 10/26/2020    ASSESSMENT & PLAN:  Malignant neoplasm of upper-outer quadrant of right breast in female, estrogen receptor positive ([place]) 07/28/2020:Patient palpated a right breast mass for 1-2 years. Mammogram showed a 2.2cm mass at the 11 o'clock position with surrounding calcifications, 6.4cm in total extent, and up to 5 abnormal right axillary lymph nodes. 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  Treatment plan: 1.Neoadjuvantchemotherapy (MammaPrint testHigh Risk): AC foll by Taxol  2.mastectomy versus breast conserving surgery with targeted node dissection 3.Adjuvant radiation therapy 4.Follow-up adjuvant antiestrogen therapy URCC 16070: Treatment of refractory nausea ------------------------------------------------------------------------------------------------------------------------------- Current Treatment: completed 4 cycles ofAdriamycin and Cytoxan, today is cycle 1 Taxol  Chemo toxicities: 1.Refractory nausea/vomiting: she is enrolled in [place] study.No further nausea issues 2.Alopecia 4.stomach upset: Protonix 5.severe hot flashes: Slightly better on Effexor 6.Elevated blood sugar: I discontinued dexamethasone  Monitoring closely for toxicities. RTC weekly for taxol and every other week for follow up with me     No orders of the defined types were placed in this encounter.  The patient has a good understanding of the overall plan. she agrees with it. she will call with any problems that may develop before the next visit here.  Total time spent: 30 mins including face to face time and time spent for planning, charting and  coordination of care  Rulon Eisenmenger, MD, MPH 10/26/2020  I, Molly Dorshimer, am acting as scribe for Dr. Nicholas Lose.  I have reviewed the above documentation for accuracy and completeness, and I agree with the above.

## 2020-10-26 ENCOUNTER — Inpatient Hospital Stay: Payer: Medicaid Other | Attending: Hematology and Oncology

## 2020-10-26 ENCOUNTER — Inpatient Hospital Stay: Payer: Medicaid Other

## 2020-10-26 ENCOUNTER — Inpatient Hospital Stay (HOSPITAL_BASED_OUTPATIENT_CLINIC_OR_DEPARTMENT_OTHER): Payer: Medicaid Other | Admitting: Hematology and Oncology

## 2020-10-26 ENCOUNTER — Other Ambulatory Visit: Payer: Self-pay

## 2020-10-26 VITALS — BP 135/80 | HR 66 | Temp 98.6°F | Resp 18

## 2020-10-26 DIAGNOSIS — Z170 Estrogen receptor positive status [ER+]: Secondary | ICD-10-CM | POA: Diagnosis not present

## 2020-10-26 DIAGNOSIS — C50411 Malignant neoplasm of upper-outer quadrant of right female breast: Secondary | ICD-10-CM | POA: Diagnosis present

## 2020-10-26 DIAGNOSIS — Z79899 Other long term (current) drug therapy: Secondary | ICD-10-CM | POA: Diagnosis not present

## 2020-10-26 DIAGNOSIS — C773 Secondary and unspecified malignant neoplasm of axilla and upper limb lymph nodes: Secondary | ICD-10-CM | POA: Insufficient documentation

## 2020-10-26 DIAGNOSIS — Z7952 Long term (current) use of systemic steroids: Secondary | ICD-10-CM | POA: Insufficient documentation

## 2020-10-26 DIAGNOSIS — D500 Iron deficiency anemia secondary to blood loss (chronic): Secondary | ICD-10-CM

## 2020-10-26 DIAGNOSIS — Z452 Encounter for adjustment and management of vascular access device: Secondary | ICD-10-CM

## 2020-10-26 DIAGNOSIS — Z5111 Encounter for antineoplastic chemotherapy: Secondary | ICD-10-CM | POA: Diagnosis present

## 2020-10-26 LAB — CMP (CANCER CENTER ONLY)
ALT: 14 U/L (ref 0–44)
AST: 11 U/L — ABNORMAL LOW (ref 15–41)
Albumin: 3.9 g/dL (ref 3.5–5.0)
Alkaline Phosphatase: 87 U/L (ref 38–126)
Anion gap: 8 (ref 5–15)
BUN: 10 mg/dL (ref 6–20)
CO2: 22 mmol/L (ref 22–32)
Calcium: 9.1 mg/dL (ref 8.9–10.3)
Chloride: 108 mmol/L (ref 98–111)
Creatinine: 0.66 mg/dL (ref 0.44–1.00)
GFR, Estimated: >60 mL/min (ref >60)
Glucose, Bld: 163 mg/dL — ABNORMAL HIGH (ref 70–99)
Potassium: 3.8 mmol/L (ref 3.5–5.1)
Sodium: 138 mmol/L (ref 135–145)
Total Bilirubin: 0.3 mg/dL (ref 0.3–1.2)
Total Protein: 7 g/dL (ref 6.5–8.1)

## 2020-10-26 LAB — CBC WITH DIFFERENTIAL (CANCER CENTER ONLY)
Abs Immature Granulocytes: 0.61 10*3/uL — ABNORMAL HIGH (ref 0.00–0.07)
Basophils Absolute: 0.1 10*3/uL (ref 0.0–0.1)
Basophils Relative: 1 %
Eosinophils Absolute: 0.2 10*3/uL (ref 0.0–0.5)
Eosinophils Relative: 2 %
HCT: 32.1 % — ABNORMAL LOW (ref 36.0–46.0)
Hemoglobin: 10.3 g/dL — ABNORMAL LOW (ref 12.0–15.0)
Immature Granulocytes: 5 %
Lymphocytes Relative: 12 %
Lymphs Abs: 1.4 10*3/uL (ref 0.7–4.0)
MCH: 28.1 pg (ref 26.0–34.0)
MCHC: 32.1 g/dL (ref 30.0–36.0)
MCV: 87.7 fL (ref 80.0–100.0)
Monocytes Absolute: 1.2 10*3/uL — ABNORMAL HIGH (ref 0.1–1.0)
Monocytes Relative: 10 %
Neutro Abs: 8.6 10*3/uL — ABNORMAL HIGH (ref 1.7–7.7)
Neutrophils Relative %: 70 %
Platelet Count: 150 10*3/uL (ref 150–400)
RBC: 3.66 MIL/uL — ABNORMAL LOW (ref 3.87–5.11)
RDW: 23.6 % — ABNORMAL HIGH (ref 11.5–15.5)
WBC Count: 12 10*3/uL — ABNORMAL HIGH (ref 4.0–10.5)
nRBC: 0 % (ref 0.0–0.2)

## 2020-10-26 MED ORDER — SODIUM CHLORIDE 0.9 % IV SOLN
80.0000 mg/m2 | Freq: Once | INTRAVENOUS | Status: AC
  Administered 2020-10-26: 162 mg via INTRAVENOUS
  Filled 2020-10-26: qty 27

## 2020-10-26 MED ORDER — FAMOTIDINE IN NACL 20-0.9 MG/50ML-% IV SOLN
INTRAVENOUS | Status: AC
  Filled 2020-10-26: qty 50

## 2020-10-26 MED ORDER — SODIUM CHLORIDE 0.9 % IV SOLN
Freq: Once | INTRAVENOUS | Status: AC
  Filled 2020-10-26: qty 250

## 2020-10-26 MED ORDER — DIPHENHYDRAMINE HCL 50 MG/ML IJ SOLN
INTRAMUSCULAR | Status: AC
  Filled 2020-10-26: qty 1

## 2020-10-26 MED ORDER — FAMOTIDINE IN NACL 20-0.9 MG/50ML-% IV SOLN
20.0000 mg | Freq: Once | INTRAVENOUS | Status: AC
  Administered 2020-10-26: 20 mg via INTRAVENOUS

## 2020-10-26 MED ORDER — HEPARIN SOD (PORK) LOCK FLUSH 100 UNIT/ML IV SOLN
500.0000 [IU] | Freq: Once | INTRAVENOUS | Status: AC | PRN
Start: 2020-10-26 — End: 2020-10-26
  Administered 2020-10-26: 500 [IU]
  Filled 2020-10-26: qty 5

## 2020-10-26 MED ORDER — PALONOSETRON HCL INJECTION 0.25 MG/5ML
INTRAVENOUS | Status: AC
  Filled 2020-10-26: qty 5

## 2020-10-26 MED ORDER — DIPHENHYDRAMINE HCL 50 MG/ML IJ SOLN
25.0000 mg | Freq: Once | INTRAMUSCULAR | Status: AC
  Administered 2020-10-26: 25 mg via INTRAVENOUS

## 2020-10-26 MED ORDER — SODIUM CHLORIDE 0.9 % IV SOLN
150.0000 mg | Freq: Once | INTRAVENOUS | Status: AC
  Administered 2020-10-26: 150 mg via INTRAVENOUS
  Filled 2020-10-26: qty 150

## 2020-10-26 MED ORDER — SODIUM CHLORIDE 0.9% FLUSH
10.0000 mL | Freq: Once | INTRAVENOUS | Status: AC
  Administered 2020-10-26: 10 mL
  Filled 2020-10-26: qty 10

## 2020-10-26 MED ORDER — SODIUM CHLORIDE 0.9% FLUSH
10.0000 mL | INTRAVENOUS | Status: DC | PRN
  Administered 2020-10-26: 10 mL
  Filled 2020-10-26: qty 10

## 2020-10-26 MED ORDER — PALONOSETRON HCL INJECTION 0.25 MG/5ML
0.2500 mg | Freq: Once | INTRAVENOUS | Status: AC
  Administered 2020-10-26: 0.25 mg via INTRAVENOUS

## 2020-10-26 NOTE — Patient Instructions (Signed)
[facility] Discharge Instructions for Patients Receiving Chemotherapy  Today you received the following chemotherapy agents: paclitaxel.  To help prevent nausea and vomiting after your treatment, we encourage you to take your nausea medication as directed.   If you develop nausea and vomiting that is not controlled by your nausea medication, call the clinic.   BELOW ARE SYMPTOMS THAT SHOULD BE REPORTED IMMEDIATELY:  *FEVER GREATER THAN 100.5 F  *CHILLS WITH OR WITHOUT FEVER  NAUSEA AND VOMITING THAT IS NOT CONTROLLED WITH YOUR NAUSEA MEDICATION  *UNUSUAL SHORTNESS OF BREATH  *UNUSUAL BRUISING OR BLEEDING  TENDERNESS IN MOUTH AND THROAT WITH OR WITHOUT PRESENCE OF ULCERS  *URINARY PROBLEMS  *BOWEL PROBLEMS  UNUSUAL RASH Items with * indicate a potential emergency and should be followed up as soon as possible.  Feel free to call the clinic should you have any questions or concerns. The clinic phone number is [phone].  Please show the CHEMO ALERT CARD at check-in to the Emergency Department and triage nurse.   

## 2020-10-26 NOTE — Assessment & Plan Note (Signed)
07/28/2020:Patient palpated a right breast mass for 1-2 years. Mammogram showed a 2.2cm mass at the 11 o'clock position with surrounding calcifications, 6.4cm in total extent, and up to 5 abnormal right axillary lymph nodes. 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  Treatment plan: 1.Neoadjuvantchemotherapy (MammaPrint testHigh Risk): AC foll by Taxol  2.mastectomy versus breast conserving surgery with targeted node dissection 3.Adjuvant radiation therapy 4.Follow-up adjuvant antiestrogen therapy URCC 16070: Treatment of refractory nausea ------------------------------------------------------------------------------------------------------------------------------- Current Treatment: completed 4 cycles ofAdriamycin and Cytoxan, today is cycle 1 Taxol  Chemo toxicities: 1.Refractory nausea/vomiting: she is enrolled in [place] study.No further nausea issues 2.bone pain/spasmsdue to Neulasta: Much better 3.Alopecia 4.stomach upset: Sent prescription for Protonix 5.severe hot flashes: Slightly better on Effexor 6.Elevated blood sugar: I discontinued dexamethasone  Monitoring closely for toxicities. RTC weekly for taxol and every other week for follow up with me

## 2020-10-26 NOTE — Progress Notes (Signed)
Continue Emend and Aloxi with weekly Taxol treatments per MD.  Orders added to future treatments as requested. No dexamethasone due to hyperglycemia.   Raul Del Ogilvie, [place], BCPS, BCOP 10/26/2020 9:12 AM

## 2020-11-01 NOTE — Assessment & Plan Note (Signed)
07/28/2020:Patient palpated a right breast mass for 1-2 years. Mammogram showed a 2.2cm mass at the 11 o'clock position with surrounding calcifications, 6.4cm in total extent, and up to 5 abnormal right axillary lymph nodes. 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  Treatment plan: 1.Neoadjuvantchemotherapy (MammaPrint testHigh Risk): AC foll by Taxol  2.mastectomy versus breast conserving surgery with targeted node dissection 3.Adjuvant radiation therapy 4.Follow-up adjuvant antiestrogen therapy [facility] 16070: Treatment of refractory nausea ------------------------------------------------------------------------------------------------------------------------------- Current Treatment: completed 4 cycles ofAdriamycin and Cytoxan, today is cycle 2 Taxol  Chemo toxicities: 1.Refractory nausea/vomiting: she is enrolled in [place] study.No further nausea issues 2.Alopecia 4.stomach upset: Protonix 5.severe hot flashes:Slightly better on Effexor 6.Elevated blood sugar: I discontinued dexamethasone  Monitoring closely for toxicities. RTC weekly for taxol and every other week for follow up with me

## 2020-11-01 NOTE — Progress Notes (Signed)
HEMATOLOGY-ONCOLOGY PROGRESS NOTE   CHIEF COMPLIANT: Cycle2Taxol  INTERVAL HISTORY: Melody Rodriguez is a 45 y.o. female with above-mentioned history of right breast cancercurrently on neoadjuvant chemotherapy with weekly Taxol after completing four cycles of dose dense Adriamycin and Cytoxan.She presents to the clinic todayfortoxicity checkandcycle2.  Denies nausea. Dryness of hands.  Oncology History  Malignant neoplasm of upper-outer quadrant of right breast in female, estrogen receptor positive ([place])  07/28/2020 Initial Diagnosis   Patient palpated a right breast mass for 1-2 years. Mammogram showed a 2.2cm mass at the 11 o'clock position with surrounding calcifications, 6.4cm in total extent, and up to 5 abnormal right axillary lymph nodes. 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    08/05/2020 Miscellaneous   MammaPrint: High risk luminal type B   08/11/2020 Genetic Testing   Negative genetic testing: no pathogenic variants detected in [facility] Breast Cancer STAT Panel or Common Hereditary Cancers panel. The report dates are August 11, 2020 and August 19, 2020, respectively. Two variants of uncertain signficance were detected - one in the CTNNA1 gene called c.86del and the second in the MLH1 gene called c.808A>G.   The STAT Breast cancer panel offered by [facility] includes sequencing and rearrangement analysis for the following 9 genes:  ATM, BRCA1, BRCA2, CDH1, CHEK2, PALB2, PTEN, STK11 and TP53.  The Common Hereditary Cancers Panel offered by [facility] includes sequencing and/or deletion duplication testing of the following 48 genes: APC, ATM, AXIN2, BARD1, BMPR1A, BRCA1, BRCA2, BRIP1, CDH1, CDK4, CDKN2A (p14ARF), CDKN2A (p16INK4a), CHEK2, CTNNA1, DICER1, EPCAM (Deletion/duplication testing only), GREM1 (promoter region deletion/duplication testing  only), KIT, MEN1, MLH1, MSH2, MSH3, MSH6, MUTYH, NBN, NF1, NTHL1, PALB2, PDGFRA, PMS2, POLD1, POLE, PTEN, RAD50, RAD51C, RAD51D, RNF43, SDHB, SDHC, SDHD, SMAD4, SMARCA4. STK11, TP53, TSC1, TSC2, and VHL.  The following genes were evaluated for sequence changes only: SDHA and HOXB13 c.251G>A variant only.    09/01/2020 -  Chemotherapy    Patient is on Treatment Plan: BREAST DOSE DENSE AC Q14D / PACLITAXEL D1,8,15 Q21D (CARBO REMOVED BY DR. Lindi Adie)        Observations/Objective:  Today's Vitals   11/02/20 0857  BP: 127/70  Pulse: 78  Resp: 18  Temp: 98.1 F (36.7 C)  SpO2: 100%  Weight: 207 lb 6.4 oz (94.1 kg)  Height: _0  (1.626 m)   Body mass index is 35.6 kg/m.  I have reviewed the data as listed CMP Latest Ref Rng & Units 10/26/2020 10/12/2020 09/28/2020  Glucose 70 - 99 mg/dL 163(H) 178(H) 174(H)  BUN 6 - 20 mg/dL _1 Creatinine 0.44 - 1.00 mg/dL 0.66 0.66 0.68  Sodium 135 - 145 mmol/L 138 141 139  Potassium 3.5 - 5.1 mmol/L 3.8 3.7 3.7  Chloride 98 - 111 mmol/L 108 107 106  CO2 22 - 32 mmol/L _2 Calcium 8.9 - 10.3 mg/dL 9.1 8.8(L) 9.2  Total Protein 6.5 - 8.1 g/dL 7.0 6.9 6.8  Total Bilirubin 0.3 - 1.2 mg/dL 0.3 0.2(L) 0.3  Alkaline Phos 38 - 126 U/L 87 104 102  AST 15 - 41 U/L 11(L) 11(L) 10(L)  ALT 0 - 44 U/L _3 Lab Results  Component Value Date   WBC 5.8 11/02/2020   HGB 9.5 (L) 11/02/2020   HCT 29.7 (L) 11/02/2020   MCV 89.2 11/02/2020   PLT 283  11/02/2020   NEUTROABS 3.8 11/02/2020      Assessment Plan:  Malignant neoplasm of upper-outer quadrant of right breast in female, estrogen receptor positive ([place]) 07/28/2020:Patient palpated a right breast mass for 1-2 years. Mammogram showed a 2.2cm mass at the 11 o'clock position with surrounding calcifications, 6.4cm in total extent, and up to 5 abnormal right axillary lymph nodes. 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  Treatment plan: 1.Neoadjuvantchemotherapy (MammaPrint testHigh Risk): AC foll by Taxol  2.mastectomy versus breast conserving surgery with targeted node dissection 3.Adjuvant radiation therapy 4.Follow-up adjuvant antiestrogen therapy [facility] 16070: Treatment of refractory nausea ------------------------------------------------------------------------------------------------------------------------------- Current Treatment: completed 4 cycles ofAdriamycin and Cytoxan, today is cycle 2 Taxol  Chemo toxicities: 1.Refractory nausea/vomiting: she is enrolled in [place] study.No further nausea issues 2.Alopecia 4.Constipation: Rec to use miralax if needed. 5.severe hot flashes:Slightly better on Effexor 6.Elevated blood sugar: I discontinued dexamethasone  Monitoring closely for toxicities. RTC weekly for taxol and every other week for follow up with me   I discussed the assessment and treatment plan with the patient. The patient was provided an opportunity to ask questions and all were answered. The patient agreed with the plan and demonstrated an understanding of the instructions. The patient was advised to call back or seek an in-person evaluation if the symptoms worsen or if the condition fails to improve as anticipated.   Total time spent: 30 minutes including face-to-face MyChart video visit time and time spent for planning, charting and coordination of care  Rulon Eisenmenger, MD 11/02/2020   I, Cloyde Reams Dorshimer, am acting as scribe for Nicholas Lose, MD.  I have reviewed the above documentation for accuracy and completeness, and I agree with the above.

## 2020-11-02 ENCOUNTER — Encounter: Payer: Self-pay | Admitting: *Deleted

## 2020-11-02 ENCOUNTER — Other Ambulatory Visit: Payer: Self-pay

## 2020-11-02 ENCOUNTER — Inpatient Hospital Stay: Payer: Medicaid Other

## 2020-11-02 ENCOUNTER — Inpatient Hospital Stay (HOSPITAL_BASED_OUTPATIENT_CLINIC_OR_DEPARTMENT_OTHER): Payer: Medicaid Other | Admitting: Hematology and Oncology

## 2020-11-02 DIAGNOSIS — Z170 Estrogen receptor positive status [ER+]: Secondary | ICD-10-CM

## 2020-11-02 DIAGNOSIS — C50411 Malignant neoplasm of upper-outer quadrant of right female breast: Secondary | ICD-10-CM

## 2020-11-02 DIAGNOSIS — Z452 Encounter for adjustment and management of vascular access device: Secondary | ICD-10-CM

## 2020-11-02 DIAGNOSIS — D500 Iron deficiency anemia secondary to blood loss (chronic): Secondary | ICD-10-CM

## 2020-11-02 DIAGNOSIS — Z5111 Encounter for antineoplastic chemotherapy: Secondary | ICD-10-CM | POA: Diagnosis not present

## 2020-11-02 LAB — CBC WITH DIFFERENTIAL (CANCER CENTER ONLY)
Abs Immature Granulocytes: 0.03 10*3/uL (ref 0.00–0.07)
Basophils Absolute: 0.1 10*3/uL (ref 0.0–0.1)
Basophils Relative: 1 %
Eosinophils Absolute: 0.3 10*3/uL (ref 0.0–0.5)
Eosinophils Relative: 5 %
HCT: 29.7 % — ABNORMAL LOW (ref 36.0–46.0)
Hemoglobin: 9.5 g/dL — ABNORMAL LOW (ref 12.0–15.0)
Immature Granulocytes: 1 %
Lymphocytes Relative: 18 %
Lymphs Abs: 1 10*3/uL (ref 0.7–4.0)
MCH: 28.5 pg (ref 26.0–34.0)
MCHC: 32 g/dL (ref 30.0–36.0)
MCV: 89.2 fL (ref 80.0–100.0)
Monocytes Absolute: 0.6 10*3/uL (ref 0.1–1.0)
Monocytes Relative: 10 %
Neutro Abs: 3.8 10*3/uL (ref 1.7–7.7)
Neutrophils Relative %: 65 %
Platelet Count: 283 10*3/uL (ref 150–400)
RBC: 3.33 MIL/uL — ABNORMAL LOW (ref 3.87–5.11)
RDW: 22.5 % — ABNORMAL HIGH (ref 11.5–15.5)
WBC Count: 5.8 10*3/uL (ref 4.0–10.5)
nRBC: 0 % (ref 0.0–0.2)

## 2020-11-02 LAB — CMP (CANCER CENTER ONLY)
ALT: 19 U/L (ref 0–44)
AST: 18 U/L (ref 15–41)
Albumin: 3.6 g/dL (ref 3.5–5.0)
Alkaline Phosphatase: 63 U/L (ref 38–126)
Anion gap: 7 (ref 5–15)
BUN: 8 mg/dL (ref 6–20)
CO2: 22 mmol/L (ref 22–32)
Calcium: 8.5 mg/dL — ABNORMAL LOW (ref 8.9–10.3)
Chloride: 110 mmol/L (ref 98–111)
Creatinine: 0.66 mg/dL (ref 0.44–1.00)
GFR, Estimated: >60 mL/min (ref >60)
Glucose, Bld: 159 mg/dL — ABNORMAL HIGH (ref 70–99)
Potassium: 3.8 mmol/L (ref 3.5–5.1)
Sodium: 139 mmol/L (ref 135–145)
Total Bilirubin: <0.2 mg/dL — ABNORMAL LOW (ref 0.3–1.2)
Total Protein: 6.3 g/dL — ABNORMAL LOW (ref 6.5–8.1)

## 2020-11-02 MED ORDER — DIPHENHYDRAMINE HCL 50 MG/ML IJ SOLN
INTRAMUSCULAR | Status: AC
  Filled 2020-11-02: qty 1

## 2020-11-02 MED ORDER — PROCHLORPERAZINE MALEATE 10 MG PO TABS
ORAL_TABLET | ORAL | Status: AC
  Filled 2020-11-02: qty 1

## 2020-11-02 MED ORDER — HEPARIN SOD (PORK) LOCK FLUSH 100 UNIT/ML IV SOLN
500.0000 [IU] | Freq: Once | INTRAVENOUS | Status: AC | PRN
  Administered 2020-11-02: 500 [IU]
  Filled 2020-11-02: qty 5

## 2020-11-02 MED ORDER — SODIUM CHLORIDE 0.9 % IV SOLN
80.0000 mg/m2 | Freq: Once | INTRAVENOUS | Status: AC
  Administered 2020-11-02: 162 mg via INTRAVENOUS
  Filled 2020-11-02: qty 27

## 2020-11-02 MED ORDER — PALONOSETRON HCL INJECTION 0.25 MG/5ML
INTRAVENOUS | Status: AC
  Filled 2020-11-02: qty 5

## 2020-11-02 MED ORDER — DIPHENHYDRAMINE HCL 50 MG/ML IJ SOLN
25.0000 mg | Freq: Once | INTRAMUSCULAR | Status: AC
  Administered 2020-11-02: 25 mg via INTRAVENOUS

## 2020-11-02 MED ORDER — PALONOSETRON HCL INJECTION 0.25 MG/5ML
0.2500 mg | Freq: Once | INTRAVENOUS | Status: AC
  Administered 2020-11-02: 0.25 mg via INTRAVENOUS

## 2020-11-02 MED ORDER — SODIUM CHLORIDE 0.9 % IV SOLN
150.0000 mg | Freq: Once | INTRAVENOUS | Status: AC
  Administered 2020-11-02: 150 mg via INTRAVENOUS
  Filled 2020-11-02: qty 150

## 2020-11-02 MED ORDER — SODIUM CHLORIDE 0.9 % IV SOLN
Freq: Once | INTRAVENOUS | Status: AC
  Filled 2020-11-02: qty 250

## 2020-11-02 MED ORDER — FAMOTIDINE IN NACL 20-0.9 MG/50ML-% IV SOLN
INTRAVENOUS | Status: AC
  Filled 2020-11-02: qty 50

## 2020-11-02 MED ORDER — DIPHENHYDRAMINE HCL 25 MG PO CAPS
ORAL_CAPSULE | ORAL | Status: AC
  Filled 2020-11-02: qty 1

## 2020-11-02 MED ORDER — FAMOTIDINE IN NACL 20-0.9 MG/50ML-% IV SOLN
20.0000 mg | Freq: Once | INTRAVENOUS | Status: AC
  Administered 2020-11-02: 20 mg via INTRAVENOUS

## 2020-11-02 MED ORDER — SODIUM CHLORIDE 0.9% FLUSH
10.0000 mL | Freq: Once | INTRAVENOUS | Status: AC
  Administered 2020-11-02: 10 mL
  Filled 2020-11-02: qty 10

## 2020-11-02 MED ORDER — ACETAMINOPHEN 325 MG PO TABS
ORAL_TABLET | ORAL | Status: AC
  Filled 2020-11-02: qty 2

## 2020-11-02 MED ORDER — SODIUM CHLORIDE 0.9% FLUSH
10.0000 mL | INTRAVENOUS | Status: DC | PRN
  Administered 2020-11-02: 10 mL
  Filled 2020-11-02: qty 10

## 2020-11-02 NOTE — Patient Instructions (Signed)
[facility] Discharge Instructions for Patients Receiving Chemotherapy  Today you received the following chemotherapy agents: paclitaxel.  To help prevent nausea and vomiting after your treatment, we encourage you to take your nausea medication as directed.   If you develop nausea and vomiting that is not controlled by your nausea medication, call the clinic.   BELOW ARE SYMPTOMS THAT SHOULD BE REPORTED IMMEDIATELY:  *FEVER GREATER THAN 100.5 F  *CHILLS WITH OR WITHOUT FEVER  NAUSEA AND VOMITING THAT IS NOT CONTROLLED WITH YOUR NAUSEA MEDICATION  *UNUSUAL SHORTNESS OF BREATH  *UNUSUAL BRUISING OR BLEEDING  TENDERNESS IN MOUTH AND THROAT WITH OR WITHOUT PRESENCE OF ULCERS  *URINARY PROBLEMS  *BOWEL PROBLEMS  UNUSUAL RASH Items with * indicate a potential emergency and should be followed up as soon as possible.  Feel free to call the clinic should you have any questions or concerns. The clinic phone number is [phone].  Please show the CHEMO ALERT CARD at check-in to the Emergency Department and triage nurse.   

## 2020-11-08 NOTE — Assessment & Plan Note (Signed)
07/28/2020:Patient palpated a right breast mass for 1-2 years. Mammogram showed a 2.2cm mass at the 11 o'clock position with surrounding calcifications, 6.4cm in total extent, and up to 5 abnormal right axillary lymph nodes. 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  Treatment plan:  1. Neoadjuvant chemotherapy (MammaPrint test High Risk): AC foll by Taxol  2. mastectomy versus breast conserving surgery with targeted node dissection  3. Adjuvant radiation therapy  4. Follow-up adjuvant antiestrogen therapy  [facility] 16070: Treatment of refractory nausea  -------------------------------------------------------------------------------------------------------------------------------  Current Treatment: completed 4 cycles of Adriamycin and Cytoxan, today is cycle 2 Taxol  Chemo toxicities:  1. Refractory nausea/vomiting: she is enrolled in [place] study. No further nausea issues  2. Alopecia  4. Constipation: Rec to use miralax if needed.  5. severe hot flashes: Slightly better on Effexor 07/28/2020:Patient palpated a right breast mass for 1-2 years. Mammogram showed a 2.2cm mass at the 11 o'clock position with surrounding calcifications, 6.4cm in total extent, and up to 5 abnormal right axillary lymph nodes. 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  Treatment plan:  1. Neoadjuvant chemotherapy (MammaPrint test High Risk): AC foll by Taxol  2. mastectomy versus breast conserving surgery with targeted node dissection  3. Adjuvant radiation therapy  4. Follow-up adjuvant antiestrogen therapy  [facility] 16070: Treatment of refractory nausea   -------------------------------------------------------------------------------------------------------------------------------  Current Treatment: completed 4 cycles of Adriamycin and Cytoxan, today is cycle 3 Taxol  Chemo toxicities:  1. Refractory nausea/vomiting: she is enrolled in [place] study. No further nausea issues  2. Alopecia  4. Constipation: Rec to use miralax if needed.  5. severe hot flashes: Slightly better on Effexor  6. Elevated blood sugar: I discontinued dexamethasone  Monitoring closely for toxicities.  RTC weekly for taxol and every other week for follow up with me   6. Elevated blood sugar: I discontinued dexamethasone  Monitoring closely for toxicities.  RTC weekly for taxol and every other week for follow up with me

## 2020-11-08 NOTE — Progress Notes (Signed)
Patient Care Team: Patient, [name] as PCP - General (General Practice) Rockwell Germany, RN as Oncology Nurse Navigator Mauro Kaufmann, RN as Oncology Nurse Navigator [name], MD as Consulting Physician (General Surgery) Nicholas Lose, MD as Consulting Physician (Hematology and Oncology) Gery Pray, MD as Consulting Physician (Radiation Oncology)  DIAGNOSIS:    ICD-10-CM   1. Malignant neoplasm of upper-outer quadrant of right breast in female, estrogen receptor positive ([place])  C50.411    Z17.0     SUMMARY OF ONCOLOGIC HISTORY: Oncology History  Malignant neoplasm of upper-outer quadrant of right breast in female, estrogen receptor positive ([place])  07/28/2020 Initial Diagnosis   Patient palpated a right breast mass for 1-2 years. Mammogram showed a 2.2cm mass at the 11 o'clock position with surrounding calcifications, 6.4cm in total extent, and up to 5 abnormal right axillary lymph nodes. 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    08/05/2020 Miscellaneous   MammaPrint: High risk luminal type B   08/11/2020 Genetic Testing   Negative genetic testing: no pathogenic variants detected in [facility] Breast Cancer STAT Panel or Common Hereditary Cancers panel. The report dates are August 11, 2020 and August 19, 2020, respectively. Two variants of uncertain signficance were detected - one in the CTNNA1 gene called c.86del and the second in the MLH1 gene called c.808A>G.   The STAT Breast cancer panel offered by [facility] includes sequencing and rearrangement analysis for the following 9 genes:  ATM, BRCA1, BRCA2, CDH1, CHEK2, PALB2, PTEN, STK11 and TP53.  The Common Hereditary Cancers Panel offered by [facility] includes sequencing and/or deletion duplication testing of the following 48 genes: APC, ATM, AXIN2, BARD1, BMPR1A, BRCA1, BRCA2, BRIP1,  CDH1, CDK4, CDKN2A (p14ARF), CDKN2A (p16INK4a), CHEK2, CTNNA1, DICER1, EPCAM (Deletion/duplication testing only), GREM1 (promoter region deletion/duplication testing only), KIT, MEN1, MLH1, MSH2, MSH3, MSH6, MUTYH, NBN, NF1, NTHL1, PALB2, PDGFRA, PMS2, POLD1, POLE, PTEN, RAD50, RAD51C, RAD51D, RNF43, SDHB, SDHC, SDHD, SMAD4, SMARCA4. STK11, TP53, TSC1, TSC2, and VHL.  The following genes were evaluated for sequence changes only: SDHA and HOXB13 c.251G>A variant only.    09/01/2020 -  Chemotherapy    Patient is on Treatment Plan: BREAST DOSE DENSE AC Q14D / PACLITAXEL D1,8,15 Q21D (CARBO REMOVED BY DR. Lindi Adie)        CHIEF COMPLIANT: Cycle3Taxol  INTERVAL HISTORY: Melody Rodriguez is a 45 y.o. with above-mentioned history of right breast cancercurrently on neoadjuvant chemotherapy withweekly Taxol after completing four cycles ofdose dense Adriamycin and Cytoxan.She presents to the clinic todayfortoxicity checkandcycle3.  She complains of mild nausea today but it could be anticipatory nausea. Today is her birthday and she is here for treatment and partly she is emotional because of that.  ALLERGIES:  is allergic to latex.  MEDICATIONS:  Current Outpatient Medications  Medication Sig Dispense Refill  . cyclobenzaprine (FLEXERIL) 5 MG tablet Take 1 tablet (5 mg total) by mouth 3 (three) times daily as needed for muscle spasms. 20 tablet 0  . Investigational dexamethasone 4 MG tablet [facility] 16070 Blister Card 2 Take 2 tablets by mouth daily. Take in the morning on Days 2-4. 6 tablet 0  . Investigational olanzapine/placebo 10 MG capsule [facility] 16070 Blister Card 2 Take 1 capsule by mouth daily. Take in the morning on Days 2-4. 3 capsule 0  . Investigational prochlorperazine maleate/placebo 10 MG capsule [facility] 16070 Blister  Card 2 Take 1 capsule by mouth every 8 (eight) hours. Take on Days 1-4. 11 capsule 0  . lidocaine-prilocaine (EMLA) cream Apply to affected area once 30 g 3  . loratadine  (CLARITIN) 10 MG tablet Take 10 mg by mouth daily.    . ondansetron (ZOFRAN) 8 MG tablet Take 1 tablet (8 mg total) by mouth 2 (two) times daily as needed. Start on the third day after chemotherapy. 30 tablet 2  . pantoprazole (PROTONIX) 20 MG tablet Take 1 tablet (20 mg total) by mouth daily. 30 tablet 1  . venlafaxine XR (EFFEXOR-XR) 37.5 MG 24 hr capsule Take 1 capsule (37.5 mg total) by mouth daily with breakfast. 30 capsule 6   No current facility-administered medications for this visit.    PHYSICAL EXAMINATION: ECOG PERFORMANCE STATUS: 1 - Symptomatic but completely ambulatory  Vitals:   11/09/20 0924  BP: 136/69  Pulse: 78  Resp: 20  Temp: 97.7 F (36.5 C)  SpO2: 100%   Filed Weights   11/09/20 0924  Weight: 207 lb 12.8 oz (94.3 kg)    LABORATORY DATA:  I have reviewed the data as listed CMP Latest Ref Rng & Units 11/02/2020 10/26/2020 10/12/2020  Glucose 70 - 99 mg/dL 159(H) 163(H) 178(H)  BUN 6 - 20 mg/dL _0 Creatinine 0.44 - 1.00 mg/dL 0.66 0.66 0.66  Sodium 135 - 145 mmol/L 139 138 141  Potassium 3.5 - 5.1 mmol/L 3.8 3.8 3.7  Chloride 98 - 111 mmol/L 110 108 107  CO2 22 - 32 mmol/L _1 Calcium 8.9 - 10.3 mg/dL 8.5(L) 9.1 8.8(L)  Total Protein 6.5 - 8.1 g/dL 6.3(L) 7.0 6.9  Total Bilirubin 0.3 - 1.2 mg/dL <0.2(L) 0.3 0.2(L)  Alkaline Phos 38 - 126 U/L 63 87 104  AST 15 - 41 U/L 18 11(L) 11(L)  ALT 0 - 44 U/L _2 Lab Results  Component Value Date   WBC 6.1 11/09/2020   HGB 10.1 (L) 11/09/2020   HCT 30.6 (L) 11/09/2020   MCV 91.1 11/09/2020   PLT 273 11/09/2020   NEUTROABS 3.9 11/09/2020    ASSESSMENT & PLAN:  Malignant neoplasm of upper-outer quadrant of right breast in female, estrogen receptor positive ([place]) 07/28/2020:Patient palpated a right breast mass for 1-2 years. Mammogram showed a 2.2cm mass at the 11 o'clock position with surrounding calcifications, 6.4cm in total extent, and up to 5 abnormal right axillary lymph nodes. 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  Treatment plan:  1. Neoadjuvant chemotherapy (MammaPrint test High Risk): AC foll by Taxol  2. mastectomy versus breast conserving surgery with targeted node dissection  3. Adjuvant radiation therapy  4. Follow-up adjuvant antiestrogen therapy  [facility] 16070: Treatment of refractory nausea  -------------------------------------------------------------------------------------------------------------------------------  Current Treatment: completed 4 cycles of Adriamycin and Cytoxan, today is cycle 2 Taxol  Chemo toxicities:  1. Refractory nausea/vomiting: she is enrolled in [place] study. No further nausea issues  2. Alopecia  4. Constipation: Rec to use miralax if needed.  5. severe hot flashes: Slightly better on Effexor 07/28/2020:Patient palpated a right breast mass for 1-2 years. Mammogram showed a 2.2cm mass at the 11 o'clock position with surrounding calcifications, 6.4cm in total extent, and up to 5 abnormal right axillary lymph nodes. Biopsy showed invasive and in situ ductal carcinoma in  the breast and axilla, grade 2, HER-2 equivocal by IHC (2+), negative by FISH (ratio 1.6), ER+ 50% weak, PR+ 20%, Ki67 20%.  Treatment plan:  1. Neoadjuvant chemotherapy (MammaPrint test High Risk): AC foll by Taxol  2. mastectomy versus breast conserving surgery with targeted node dissection  3. Adjuvant radiation therapy  4. Follow-up adjuvant antiestrogen therapy  [facility] 16070: Treatment of refractory nausea  -------------------------------------------------------------------------------------------------------------------------------  Current Treatment: completed 4 cycles of Adriamycin and Cytoxan, today is cycle 3 Taxol  Chemo toxicities:  1. Nausea/vomiting: she is enrolled in [facility] 16070 study. Mild nausea (probably anticipatory  nausea) 2. Alopecia  4. Constipation: Much improved 5. severe hot flashes: Slightly better on Effexor  6. Elevated blood sugar: I discontinued dexamethasone monitoring closely Monitoring closely for toxicities.  RTC weekly for taxol and every other week for follow up with me    No orders of the defined types were placed in this encounter.  The patient has a good understanding of the overall plan. she agrees with it. she will call with any problems that may develop before the next visit here.  Total time spent: 30 mins including face to face time and time spent for planning, charting and coordination of care  Vinay K Gudena, MD, MPH 11/09/2020  I, Molly Dorshimer, am acting as scribe for Dr. Vinay Gudena.  I have reviewed the above documentation for accuracy and completeness, and I agree with the above.       

## 2020-11-09 ENCOUNTER — Other Ambulatory Visit: Payer: Self-pay

## 2020-11-09 ENCOUNTER — Inpatient Hospital Stay (HOSPITAL_BASED_OUTPATIENT_CLINIC_OR_DEPARTMENT_OTHER): Payer: Medicaid Other | Admitting: Hematology and Oncology

## 2020-11-09 ENCOUNTER — Inpatient Hospital Stay: Payer: Medicaid Other

## 2020-11-09 ENCOUNTER — Encounter: Payer: Self-pay | Admitting: Licensed Clinical Social Worker

## 2020-11-09 DIAGNOSIS — Z5111 Encounter for antineoplastic chemotherapy: Secondary | ICD-10-CM | POA: Diagnosis not present

## 2020-11-09 DIAGNOSIS — Z170 Estrogen receptor positive status [ER+]: Secondary | ICD-10-CM

## 2020-11-09 DIAGNOSIS — C50411 Malignant neoplasm of upper-outer quadrant of right female breast: Secondary | ICD-10-CM

## 2020-11-09 DIAGNOSIS — Z452 Encounter for adjustment and management of vascular access device: Secondary | ICD-10-CM

## 2020-11-09 DIAGNOSIS — D500 Iron deficiency anemia secondary to blood loss (chronic): Secondary | ICD-10-CM

## 2020-11-09 LAB — CBC WITH DIFFERENTIAL (CANCER CENTER ONLY)
Abs Immature Granulocytes: 0.02 10*3/uL (ref 0.00–0.07)
Basophils Absolute: 0.1 10*3/uL (ref 0.0–0.1)
Basophils Relative: 1 %
Eosinophils Absolute: 0.5 10*3/uL (ref 0.0–0.5)
Eosinophils Relative: 8 %
HCT: 30.6 % — ABNORMAL LOW (ref 36.0–46.0)
Hemoglobin: 10.1 g/dL — ABNORMAL LOW (ref 12.0–15.0)
Immature Granulocytes: 0 %
Lymphocytes Relative: 17 %
Lymphs Abs: 1 10*3/uL (ref 0.7–4.0)
MCH: 30.1 pg (ref 26.0–34.0)
MCHC: 33 g/dL (ref 30.0–36.0)
MCV: 91.1 fL (ref 80.0–100.0)
Monocytes Absolute: 0.5 10*3/uL (ref 0.1–1.0)
Monocytes Relative: 9 %
Neutro Abs: 3.9 10*3/uL (ref 1.7–7.7)
Neutrophils Relative %: 65 %
Platelet Count: 273 10*3/uL (ref 150–400)
RBC: 3.36 MIL/uL — ABNORMAL LOW (ref 3.87–5.11)
RDW: 22.8 % — ABNORMAL HIGH (ref 11.5–15.5)
WBC Count: 6.1 10*3/uL (ref 4.0–10.5)
nRBC: 0 % (ref 0.0–0.2)

## 2020-11-09 LAB — CMP (CANCER CENTER ONLY)
ALT: 37 U/L (ref 0–44)
AST: 20 U/L (ref 15–41)
Albumin: 3.8 g/dL (ref 3.5–5.0)
Alkaline Phosphatase: 59 U/L (ref 38–126)
Anion gap: 10 (ref 5–15)
BUN: 12 mg/dL (ref 6–20)
CO2: 21 mmol/L — ABNORMAL LOW (ref 22–32)
Calcium: 8.5 mg/dL — ABNORMAL LOW (ref 8.9–10.3)
Chloride: 112 mmol/L — ABNORMAL HIGH (ref 98–111)
Creatinine: 0.64 mg/dL (ref 0.44–1.00)
GFR, Estimated: >60 mL/min (ref >60)
Glucose, Bld: 153 mg/dL — ABNORMAL HIGH (ref 70–99)
Potassium: 3.8 mmol/L (ref 3.5–5.1)
Sodium: 143 mmol/L (ref 135–145)
Total Bilirubin: 0.2 mg/dL — ABNORMAL LOW (ref 0.3–1.2)
Total Protein: 6.5 g/dL (ref 6.5–8.1)

## 2020-11-09 MED ORDER — SODIUM CHLORIDE 0.9 % IV SOLN
150.0000 mg | Freq: Once | INTRAVENOUS | Status: AC
  Administered 2020-11-09: 150 mg via INTRAVENOUS
  Filled 2020-11-09: qty 150

## 2020-11-09 MED ORDER — PALONOSETRON HCL INJECTION 0.25 MG/5ML
0.2500 mg | Freq: Once | INTRAVENOUS | Status: AC
  Administered 2020-11-09: 0.25 mg via INTRAVENOUS

## 2020-11-09 MED ORDER — HEPARIN SOD (PORK) LOCK FLUSH 100 UNIT/ML IV SOLN
500.0000 [IU] | Freq: Once | INTRAVENOUS | Status: AC | PRN
  Administered 2020-11-09: 500 [IU]
  Filled 2020-11-09: qty 5

## 2020-11-09 MED ORDER — PALONOSETRON HCL INJECTION 0.25 MG/5ML
INTRAVENOUS | Status: AC
  Filled 2020-11-09: qty 5

## 2020-11-09 MED ORDER — SODIUM CHLORIDE 0.9% FLUSH
10.0000 mL | INTRAVENOUS | Status: DC | PRN
  Administered 2020-11-09: 10 mL
  Filled 2020-11-09: qty 10

## 2020-11-09 MED ORDER — FAMOTIDINE IN NACL 20-0.9 MG/50ML-% IV SOLN
20.0000 mg | Freq: Once | INTRAVENOUS | Status: AC
  Administered 2020-11-09: 20 mg via INTRAVENOUS

## 2020-11-09 MED ORDER — FAMOTIDINE IN NACL 20-0.9 MG/50ML-% IV SOLN
INTRAVENOUS | Status: AC
  Filled 2020-11-09: qty 50

## 2020-11-09 MED ORDER — SODIUM CHLORIDE 0.9 % IV SOLN
80.0000 mg/m2 | Freq: Once | INTRAVENOUS | Status: AC
  Administered 2020-11-09: 162 mg via INTRAVENOUS
  Filled 2020-11-09: qty 27

## 2020-11-09 MED ORDER — DIPHENHYDRAMINE HCL 50 MG/ML IJ SOLN
INTRAMUSCULAR | Status: AC
  Filled 2020-11-09: qty 1

## 2020-11-09 MED ORDER — DIPHENHYDRAMINE HCL 50 MG/ML IJ SOLN
25.0000 mg | Freq: Once | INTRAMUSCULAR | Status: AC
  Administered 2020-11-09: 25 mg via INTRAVENOUS

## 2020-11-09 MED ORDER — SODIUM CHLORIDE 0.9% FLUSH
10.0000 mL | Freq: Once | INTRAVENOUS | Status: AC
  Administered 2020-11-09: 10 mL
  Filled 2020-11-09: qty 10

## 2020-11-09 MED ORDER — SODIUM CHLORIDE 0.9 % IV SOLN
Freq: Once | INTRAVENOUS | Status: AC
  Filled 2020-11-09: qty 250

## 2020-11-09 NOTE — Progress Notes (Signed)
[place] CSW Progress Note  Holiday representative met with patient in infusion to provide ongoing support. Gave last set of [employer]. Patient also received [employer] which helped with some housing and car costs.  Patient reports feeling well overall today. She had an opportunity for a work-from-home job but declined due to time needs and not wanting to add that extra stress at this time.  CSW reminded patient of resources available through [facility] (such as the food pantry).   Patient may reach out with any future needs.    Christeen Douglas , LCSW

## 2020-11-09 NOTE — Patient Instructions (Signed)
[facility] Discharge Instructions for Patients Receiving Chemotherapy  Today you received the following chemotherapy agents: paclitaxel.  To help prevent nausea and vomiting after your treatment, we encourage you to take your nausea medication as directed.   If you develop nausea and vomiting that is not controlled by your nausea medication, call the clinic.   BELOW ARE SYMPTOMS THAT SHOULD BE REPORTED IMMEDIATELY:  *FEVER GREATER THAN 100.5 F  *CHILLS WITH OR WITHOUT FEVER  NAUSEA AND VOMITING THAT IS NOT CONTROLLED WITH YOUR NAUSEA MEDICATION  *UNUSUAL SHORTNESS OF BREATH  *UNUSUAL BRUISING OR BLEEDING  TENDERNESS IN MOUTH AND THROAT WITH OR WITHOUT PRESENCE OF ULCERS  *URINARY PROBLEMS  *BOWEL PROBLEMS  UNUSUAL RASH Items with * indicate a potential emergency and should be followed up as soon as possible.  Feel free to call the clinic should you have any questions or concerns. The clinic phone number is [phone].  Please show the CHEMO ALERT CARD at check-in to the Emergency Department and triage nurse.   

## 2020-11-10 ENCOUNTER — Telehealth: Payer: Self-pay | Admitting: Hematology and Oncology

## 2020-11-10 NOTE — Telephone Encounter (Signed)
No 2/22 los. No changes made to pt's schedule.  

## 2020-11-16 ENCOUNTER — Other Ambulatory Visit: Payer: Self-pay

## 2020-11-16 ENCOUNTER — Inpatient Hospital Stay: Payer: Medicaid Other

## 2020-11-16 ENCOUNTER — Inpatient Hospital Stay: Payer: Medicaid Other | Attending: Hematology and Oncology

## 2020-11-16 VITALS — BP 123/69 | HR 81 | Temp 98.3°F | Resp 18 | Ht 64.0 in | Wt 208.2 lb

## 2020-11-16 DIAGNOSIS — Z170 Estrogen receptor positive status [ER+]: Secondary | ICD-10-CM | POA: Insufficient documentation

## 2020-11-16 DIAGNOSIS — Z452 Encounter for adjustment and management of vascular access device: Secondary | ICD-10-CM

## 2020-11-16 DIAGNOSIS — Z7952 Long term (current) use of systemic steroids: Secondary | ICD-10-CM | POA: Diagnosis not present

## 2020-11-16 DIAGNOSIS — C50411 Malignant neoplasm of upper-outer quadrant of right female breast: Secondary | ICD-10-CM | POA: Insufficient documentation

## 2020-11-16 DIAGNOSIS — C773 Secondary and unspecified malignant neoplasm of axilla and upper limb lymph nodes: Secondary | ICD-10-CM | POA: Insufficient documentation

## 2020-11-16 DIAGNOSIS — Z5111 Encounter for antineoplastic chemotherapy: Secondary | ICD-10-CM | POA: Insufficient documentation

## 2020-11-16 DIAGNOSIS — R112 Nausea with vomiting, unspecified: Secondary | ICD-10-CM | POA: Diagnosis not present

## 2020-11-16 DIAGNOSIS — Z79899 Other long term (current) drug therapy: Secondary | ICD-10-CM | POA: Insufficient documentation

## 2020-11-16 DIAGNOSIS — D500 Iron deficiency anemia secondary to blood loss (chronic): Secondary | ICD-10-CM

## 2020-11-16 LAB — CMP (CANCER CENTER ONLY)
ALT: 25 U/L (ref 0–44)
AST: 16 U/L (ref 15–41)
Albumin: 3.9 g/dL (ref 3.5–5.0)
Alkaline Phosphatase: 59 U/L (ref 38–126)
Anion gap: 8 (ref 5–15)
BUN: 13 mg/dL (ref 6–20)
CO2: 22 mmol/L (ref 22–32)
Calcium: 8.6 mg/dL — ABNORMAL LOW (ref 8.9–10.3)
Chloride: 109 mmol/L (ref 98–111)
Creatinine: 0.67 mg/dL (ref 0.44–1.00)
GFR, Estimated: >60 mL/min (ref >60)
Glucose, Bld: 166 mg/dL — ABNORMAL HIGH (ref 70–99)
Potassium: 3.9 mmol/L (ref 3.5–5.1)
Sodium: 139 mmol/L (ref 135–145)
Total Bilirubin: 0.2 mg/dL — ABNORMAL LOW (ref 0.3–1.2)
Total Protein: 6.5 g/dL (ref 6.5–8.1)

## 2020-11-16 LAB — CBC WITH DIFFERENTIAL (CANCER CENTER ONLY)
Abs Immature Granulocytes: 0.02 10*3/uL (ref 0.00–0.07)
Basophils Absolute: 0.1 10*3/uL (ref 0.0–0.1)
Basophils Relative: 1 %
Eosinophils Absolute: 0.4 10*3/uL (ref 0.0–0.5)
Eosinophils Relative: 8 %
HCT: 32.7 % — ABNORMAL LOW (ref 36.0–46.0)
Hemoglobin: 10.6 g/dL — ABNORMAL LOW (ref 12.0–15.0)
Immature Granulocytes: 0 %
Lymphocytes Relative: 21 %
Lymphs Abs: 1.2 10*3/uL (ref 0.7–4.0)
MCH: 30.5 pg (ref 26.0–34.0)
MCHC: 32.4 g/dL (ref 30.0–36.0)
MCV: 94.2 fL (ref 80.0–100.0)
Monocytes Absolute: 0.4 10*3/uL (ref 0.1–1.0)
Monocytes Relative: 8 %
Neutro Abs: 3.7 10*3/uL (ref 1.7–7.7)
Neutrophils Relative %: 62 %
Platelet Count: 238 10*3/uL (ref 150–400)
RBC: 3.47 MIL/uL — ABNORMAL LOW (ref 3.87–5.11)
RDW: 20.6 % — ABNORMAL HIGH (ref 11.5–15.5)
WBC Count: 5.9 10*3/uL (ref 4.0–10.5)
nRBC: 0 % (ref 0.0–0.2)

## 2020-11-16 MED ORDER — PALONOSETRON HCL INJECTION 0.25 MG/5ML
INTRAVENOUS | Status: AC
  Filled 2020-11-16: qty 5

## 2020-11-16 MED ORDER — FAMOTIDINE IN NACL 20-0.9 MG/50ML-% IV SOLN
INTRAVENOUS | Status: AC
  Filled 2020-11-16: qty 50

## 2020-11-16 MED ORDER — SODIUM CHLORIDE 0.9 % IV SOLN
Freq: Once | INTRAVENOUS | Status: AC
  Filled 2020-11-16: qty 250

## 2020-11-16 MED ORDER — DIPHENHYDRAMINE HCL 50 MG/ML IJ SOLN
INTRAMUSCULAR | Status: AC
  Filled 2020-11-16: qty 1

## 2020-11-16 MED ORDER — HEPARIN SOD (PORK) LOCK FLUSH 100 UNIT/ML IV SOLN
500.0000 [IU] | Freq: Once | INTRAVENOUS | Status: AC | PRN
  Administered 2020-11-16: 500 [IU]
  Filled 2020-11-16: qty 5

## 2020-11-16 MED ORDER — SODIUM CHLORIDE 0.9 % IV SOLN
80.0000 mg/m2 | Freq: Once | INTRAVENOUS | Status: AC
  Administered 2020-11-16: 162 mg via INTRAVENOUS
  Filled 2020-11-16: qty 27

## 2020-11-16 MED ORDER — SODIUM CHLORIDE 0.9% FLUSH
10.0000 mL | INTRAVENOUS | Status: DC | PRN
  Administered 2020-11-16: 10 mL
  Filled 2020-11-16: qty 10

## 2020-11-16 MED ORDER — SODIUM CHLORIDE 0.9% FLUSH
10.0000 mL | Freq: Once | INTRAVENOUS | Status: AC
  Administered 2020-11-16: 10 mL
  Filled 2020-11-16: qty 10

## 2020-11-16 MED ORDER — SODIUM CHLORIDE 0.9 % IV SOLN
150.0000 mg | Freq: Once | INTRAVENOUS | Status: AC
  Administered 2020-11-16: 150 mg via INTRAVENOUS
  Filled 2020-11-16: qty 150

## 2020-11-16 MED ORDER — FAMOTIDINE IN NACL 20-0.9 MG/50ML-% IV SOLN
20.0000 mg | Freq: Once | INTRAVENOUS | Status: AC
  Administered 2020-11-16: 20 mg via INTRAVENOUS

## 2020-11-16 MED ORDER — DIPHENHYDRAMINE HCL 50 MG/ML IJ SOLN
25.0000 mg | Freq: Once | INTRAMUSCULAR | Status: AC
  Administered 2020-11-16: 25 mg via INTRAVENOUS

## 2020-11-16 MED ORDER — PALONOSETRON HCL INJECTION 0.25 MG/5ML
0.2500 mg | Freq: Once | INTRAVENOUS | Status: AC
  Administered 2020-11-16: 0.25 mg via INTRAVENOUS

## 2020-11-16 NOTE — Patient Instructions (Signed)
[facility] Discharge Instructions for Patients Receiving Chemotherapy  Today you received the following chemotherapy agents: Paclitaxel (Taxol)  To help prevent nausea and vomiting after your treatment, we encourage you to take your nausea medication as prescribed. If you develop nausea and vomiting that is not controlled by your nausea medication, call the clinic.   BELOW ARE SYMPTOMS THAT SHOULD BE REPORTED IMMEDIATELY:  *FEVER GREATER THAN 100.5 F  *CHILLS WITH OR WITHOUT FEVER  NAUSEA AND VOMITING THAT IS NOT CONTROLLED WITH YOUR NAUSEA MEDICATION  *UNUSUAL SHORTNESS OF BREATH  *UNUSUAL BRUISING OR BLEEDING  TENDERNESS IN MOUTH AND THROAT WITH OR WITHOUT PRESENCE OF ULCERS  *URINARY PROBLEMS  *BOWEL PROBLEMS  UNUSUAL RASH Items with * indicate a potential emergency and should be followed up as soon as possible.  Feel free to call the clinic should you have any questions or concerns. The clinic phone number is [phone].  Please show the CHEMO ALERT CARD at check-in to the Emergency Department and triage nurse.   

## 2020-11-19 NOTE — Progress Notes (Signed)
[id number] - TREATMENT OF REFRACTORY NAUSEA  10/12/2020  Have not received forms for cycle 2 in the mail at this time.  The patient filled out a medication diary form and 4 day home record form for cycle 2 of the study in office today.    Patient was thanked for her time and participation in the study.  Clabe Seal Clinical Research Coordinator I  10/12/2020

## 2020-11-22 NOTE — Assessment & Plan Note (Signed)
07/28/2020:Patient palpated a right breast mass for 1-2 years. Mammogram showed a 2.2cm mass at the 11 o'clock position with surrounding calcifications, 6.4cm in total extent, and up to 5 abnormal right axillary lymph nodes. 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  Treatment plan:  1. Neoadjuvant chemotherapy (MammaPrint test High Risk): AC foll by Taxol  2. mastectomy versus breast conserving surgery with targeted node dissection  3. Adjuvant radiation therapy  4. Follow-up adjuvant antiestrogen therapy  [facility]CC 16070: Treatment of refractory nausea  -------------------------------------------------------------------------------------------------------------------------------  Current Treatment: completed 4 cycles of Adriamycin and Cytoxan, today is cycle 2 Taxol  Chemo toxicities:  1. Refractory nausea/vomiting: she is enrolled in [place] study. No further nausea issues  2. Alopecia  4. Constipation: Rec to use miralax if needed.  5. severe hot flashes: Slightly better on Effexor 07/28/2020:Patient palpated a right breast mass for 1-2 years. Mammogram showed a 2.2cm mass at the 11 o'clock position with surrounding calcifications, 6.4cm in total extent, and up to 5 abnormal right axillary lymph nodes. 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   Treatment plan:  1. Neoadjuvant chemotherapy (MammaPrint test High Risk): AC foll by Taxol  2. mastectomy versus breast conserving surgery with targeted node dissection  3. Adjuvant radiation therapy  4. Follow-up adjuvant antiestrogen therapy  URCC 16070: Treatment of refractory nausea   -------------------------------------------------------------------------------------------------------------------------------  Current Treatment: completed 4 cycles of Adriamycin and Cytoxan, today is cycle 3 Taxol  Chemo toxicities:  1. Nausea/vomiting: she is enrolled in [place] study. Mild nausea (probably anticipatory nausea) 2. Alopecia  4. Constipation: Much improved 5. severe hot flashes: Slightly better on Effexor  6. Elevated blood sugar: I discontinued dexamethasone monitoring closely Monitoring closely for toxicities.  RTC weekly for taxol and every other week for follow up with me

## 2020-11-22 NOTE — Progress Notes (Signed)
Melody Rodriguez Care Team: Melody Rodriguez, [name] as PCP - General (General Practice) Rockwell Germany, RN as Oncology Nurse Navigator Mauro Kaufmann, RN as Oncology Nurse Navigator [name], MD as Consulting Physician (General Surgery) Nicholas Lose, MD as Consulting Physician (Hematology and Oncology) Gery Pray, MD as Consulting Physician (Radiation Oncology)  DIAGNOSIS:    ICD-10-CM   1. Malignant neoplasm of upper-outer quadrant of right breast in female, estrogen receptor positive ([place])  C50.411    Z17.0     SUMMARY OF ONCOLOGIC HISTORY: Oncology History  Malignant neoplasm of upper-outer quadrant of right breast in female, estrogen receptor positive ([place])  07/28/2020 Initial Diagnosis   Melody Rodriguez palpated a right breast mass for 1-2 years. Mammogram showed a 2.2cm mass at the 11 o'clock position with surrounding calcifications, 6.4cm in total extent, and up to 5 abnormal right axillary lymph nodes. 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    08/05/2020 Miscellaneous   MammaPrint: High risk luminal type B   08/11/2020 Genetic Testing   Negative genetic testing: no pathogenic variants detected in [facility] Breast Cancer STAT Panel or Common Hereditary Cancers panel. The report dates are August 11, 2020 and August 19, 2020, respectively. Two variants of uncertain signficance were detected - one in the CTNNA1 gene called c.86del and the second in the MLH1 gene called c.808A>G.   The STAT Breast cancer panel offered by [facility] includes sequencing and rearrangement analysis for the following 9 genes:  ATM, BRCA1, BRCA2, CDH1, CHEK2, PALB2, PTEN, STK11 and TP53.  The Common Hereditary Cancers Panel offered by [facility] includes sequencing and/or deletion duplication testing of the following 48 genes: APC, ATM, AXIN2, BARD1, BMPR1A, BRCA1, BRCA2, BRIP1,  CDH1, CDK4, CDKN2A (p14ARF), CDKN2A (p16INK4a), CHEK2, CTNNA1, DICER1, EPCAM (Deletion/duplication testing only), GREM1 (promoter region deletion/duplication testing only), KIT, MEN1, MLH1, MSH2, MSH3, MSH6, MUTYH, NBN, NF1, NTHL1, PALB2, PDGFRA, PMS2, POLD1, POLE, PTEN, RAD50, RAD51C, RAD51D, RNF43, SDHB, SDHC, SDHD, SMAD4, SMARCA4. STK11, TP53, TSC1, TSC2, and VHL.  The following genes were evaluated for sequence changes only: SDHA and HOXB13 c.251G>A variant only.    09/01/2020 -  Chemotherapy    Melody Rodriguez is on Treatment Plan: BREAST DOSE DENSE AC Q14D / PACLITAXEL D1,8,15 Q21D (CARBO REMOVED BY DR. Lindi Adie)        CHIEF COMPLIANT: Cycle5Taxol  INTERVAL HISTORY: Josseline Reddin is a 45 y.o. with above-mentioned history of right breast cancercurrently on neoadjuvant chemotherapy withweekly Taxol after completing four cycles ofdose dense Adriamycin and Cytoxan.She presents to the clinic todayfortoxicity checkandcycle5.   ALLERGIES:  is allergic to latex.  MEDICATIONS:  Current Outpatient Medications  Medication Sig Dispense Refill  . cyclobenzaprine (FLEXERIL) 5 MG tablet Take 1 tablet (5 mg total) by mouth 3 (three) times daily as needed for muscle spasms. 20 tablet 0  . Investigational dexamethasone 4 MG tablet [facility]RCC 16070 Blister Card 2 Take 2 tablets by mouth daily. Take in the morning on Days 2-4. 6 tablet 0  . Investigational olanzapine/placebo 10 MG capsule [facility] 16070 Blister Card 2 Take 1 capsule by mouth daily. Take in the morning on Days 2-4. 3 capsule 0  . Investigational prochlorperazine maleate/placebo 10 MG capsule URCC 16070 Blister Card 2 Take 1 capsule by mouth every 8 (eight) hours. Take on Days 1-4. 11 capsule 0  . lidocaine-prilocaine (EMLA) cream Apply to affected area once 30 g  3  . loratadine (CLARITIN) 10 MG tablet Take 10 mg by mouth daily.    . ondansetron (ZOFRAN) 8 MG tablet Take 1 tablet (8 mg total) by mouth 2 (two) times daily as needed. Start on the  third day after chemotherapy. 30 tablet 2  . pantoprazole (PROTONIX) 20 MG tablet Take 1 tablet (20 mg total) by mouth daily. 30 tablet 1  . venlafaxine XR (EFFEXOR-XR) 37.5 MG 24 hr capsule Take 1 capsule (37.5 mg total) by mouth daily with breakfast. 30 capsule 6   No current facility-administered medications for this visit.    PHYSICAL EXAMINATION: ECOG PERFORMANCE STATUS: 1 - Symptomatic but completely ambulatory  Vitals:   11/23/20 0842  BP: 132/74  Pulse: 87  Resp: 18  Temp: (!) 97.5 F (36.4 C)  SpO2: 100%   Filed Weights   11/23/20 0842  Weight: 211 lb 1.6 oz (95.8 kg)    LABORATORY DATA:  I have reviewed the data as listed CMP Latest Ref Rng & Units 11/16/2020 11/09/2020 11/02/2020  Glucose 70 - 99 mg/dL 166(H) 153(H) 159(H)  BUN 6 - 20 mg/dL _0 Creatinine 0.44 - 1.00 mg/dL 0.67 0.64 0.66  Sodium 135 - 145 mmol/L 139 143 139  Potassium 3.5 - 5.1 mmol/L 3.9 3.8 3.8  Chloride 98 - 111 mmol/L 109 112(H) 110  CO2 22 - 32 mmol/L 22 21(L) 22  Calcium 8.9 - 10.3 mg/dL 8.6(L) 8.5(L) 8.5(L)  Total Protein 6.5 - 8.1 g/dL 6.5 6.5 6.3(L)  Total Bilirubin 0.3 - 1.2 mg/dL 0.2(L) 0.2(L) <0.2(L)  Alkaline Phos 38 - 126 U/L 59 59 63  AST 15 - 41 U/L _1 ALT 0 - 44 U/L 25 37 19    Lab Results  Component Value Date   WBC 6.6 11/23/2020   HGB 11.3 (L) 11/23/2020   HCT 34.1 (L) 11/23/2020   MCV 94.2 11/23/2020   PLT 242 11/23/2020   NEUTROABS 4.3 11/23/2020    ASSESSMENT & PLAN:  Malignant neoplasm of upper-outer quadrant of right breast in female, estrogen receptor positive ([place]) 07/28/2020:Melody Rodriguez palpated a right breast mass for 1-2 years. Mammogram showed a 2.2cm mass at the 11 o'clock position with surrounding calcifications, 6.4cm in total extent, and up to 5 abnormal right axillary lymph nodes. Biopsy showed invasive and in situ ductal carcinoma in the breast and axilla, grade 2, HER-2 equivocal by IHC (2+), negative by FISH (ratio 1.6), ER+ 50% weak, PR+ 20%,  Ki67 20%.  Treatment plan:  1. Neoadjuvant chemotherapy (MammaPrint test High Risk): AC foll by Taxol  2. mastectomy versus breast conserving surgery with targeted node dissection  3. Adjuvant radiation therapy  4. Follow-up adjuvant antiestrogen therapy  URCC 16070: Treatment of refractory nausea  -------------------------------------------------------------------------------------------------------------------------------  Current Treatment: completed 4 cycles of Adriamycin and Cytoxan, today is cycle 2 Taxol  Chemo toxicities:  1. Refractory nausea/vomiting: she is enrolled in [place] study. No further nausea issues  2. Alopecia  4. Constipation: Rec to use miralax if needed.  5. severe hot flashes: Slightly better on Effexor 07/28/2020:Melody Rodriguez palpated a right breast mass for 1-2 years. Mammogram showed a 2.2cm mass at the 11 o'clock position with surrounding calcifications, 6.4cm in total extent, and up to 5 abnormal right axillary lymph nodes. 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   Treatment plan:  1.  Neoadjuvant chemotherapy (MammaPrint test High Risk): AC foll by Taxol  2. mastectomy versus breast conserving surgery with targeted node dissection  3. Adjuvant radiation therapy  4. Follow-up adjuvant antiestrogen therapy  URCC 16070: Treatment of refractory nausea  -------------------------------------------------------------------------------------------------------------------------------  Current Treatment: completed 4 cycles of Adriamycin and Cytoxan, today is cycle 5 Taxol  Chemo toxicities:  1. Nausea/vomiting: she is enrolled in [place] study. Mild nausea (probably anticipatory nausea) 2. Alopecia  4. Constipation: Much improved 5. severe hot flashes: Slightly better on Effexor  6. Elevated blood sugar: I discontinued  dexamethasone monitoring closely.  She is addicted to carbohydrates.  She is gaining a lot of weight.  We spent a lot of time talking about cutting down carbohydrates and eating healthier foods. 6.  Worsening fatigue: I reduce the dosage of chemotherapy to 60 mg/m with cycle 5  Monitoring closely for toxicities.  RTC weekly for taxol and every other week for follow up with me      No orders of the defined types were placed in this encounter.  The Melody Rodriguez has a good understanding of the overall plan. she agrees with it. she will call with any problems that may develop before the next visit here.  Total time spent: 30 mins including face to face time and time spent for planning, charting and coordination of care  Rulon Eisenmenger, MD, MPH 11/23/2020  I, Molly Dorshimer, am acting as scribe for Dr. Nicholas Lose.  I have reviewed the above documentation for accuracy and completeness, and I agree with the above.

## 2020-11-23 ENCOUNTER — Other Ambulatory Visit: Payer: Self-pay

## 2020-11-23 ENCOUNTER — Inpatient Hospital Stay: Payer: Medicaid Other

## 2020-11-23 ENCOUNTER — Encounter: Payer: Self-pay | Admitting: *Deleted

## 2020-11-23 ENCOUNTER — Inpatient Hospital Stay (HOSPITAL_BASED_OUTPATIENT_CLINIC_OR_DEPARTMENT_OTHER): Payer: Medicaid Other | Admitting: Hematology and Oncology

## 2020-11-23 DIAGNOSIS — Z170 Estrogen receptor positive status [ER+]: Secondary | ICD-10-CM | POA: Diagnosis not present

## 2020-11-23 DIAGNOSIS — D500 Iron deficiency anemia secondary to blood loss (chronic): Secondary | ICD-10-CM

## 2020-11-23 DIAGNOSIS — Z5111 Encounter for antineoplastic chemotherapy: Secondary | ICD-10-CM | POA: Diagnosis not present

## 2020-11-23 DIAGNOSIS — C50411 Malignant neoplasm of upper-outer quadrant of right female breast: Secondary | ICD-10-CM

## 2020-11-23 DIAGNOSIS — Z452 Encounter for adjustment and management of vascular access device: Secondary | ICD-10-CM

## 2020-11-23 LAB — CBC WITH DIFFERENTIAL (CANCER CENTER ONLY)
Abs Immature Granulocytes: 0.02 10*3/uL (ref 0.00–0.07)
Basophils Absolute: 0.1 10*3/uL (ref 0.0–0.1)
Basophils Relative: 1 %
Eosinophils Absolute: 0.6 10*3/uL — ABNORMAL HIGH (ref 0.0–0.5)
Eosinophils Relative: 8 %
HCT: 34.1 % — ABNORMAL LOW (ref 36.0–46.0)
Hemoglobin: 11.3 g/dL — ABNORMAL LOW (ref 12.0–15.0)
Immature Granulocytes: 0 %
Lymphocytes Relative: 18 %
Lymphs Abs: 1.2 10*3/uL (ref 0.7–4.0)
MCH: 31.2 pg (ref 26.0–34.0)
MCHC: 33.1 g/dL (ref 30.0–36.0)
MCV: 94.2 fL (ref 80.0–100.0)
Monocytes Absolute: 0.4 10*3/uL (ref 0.1–1.0)
Monocytes Relative: 7 %
Neutro Abs: 4.3 10*3/uL (ref 1.7–7.7)
Neutrophils Relative %: 66 %
Platelet Count: 242 10*3/uL (ref 150–400)
RBC: 3.62 MIL/uL — ABNORMAL LOW (ref 3.87–5.11)
RDW: 18.4 % — ABNORMAL HIGH (ref 11.5–15.5)
WBC Count: 6.6 10*3/uL (ref 4.0–10.5)
nRBC: 0 % (ref 0.0–0.2)

## 2020-11-23 LAB — CMP (CANCER CENTER ONLY)
ALT: 32 U/L (ref 0–44)
AST: 27 U/L (ref 15–41)
Albumin: 4 g/dL (ref 3.5–5.0)
Alkaline Phosphatase: 59 U/L (ref 38–126)
Anion gap: 12 (ref 5–15)
BUN: 11 mg/dL (ref 6–20)
CO2: 20 mmol/L — ABNORMAL LOW (ref 22–32)
Calcium: 8.9 mg/dL (ref 8.9–10.3)
Chloride: 106 mmol/L (ref 98–111)
Creatinine: 0.66 mg/dL (ref 0.44–1.00)
GFR, Estimated: >60 mL/min (ref >60)
Glucose, Bld: 174 mg/dL — ABNORMAL HIGH (ref 70–99)
Potassium: 4.2 mmol/L (ref 3.5–5.1)
Sodium: 138 mmol/L (ref 135–145)
Total Bilirubin: 0.3 mg/dL (ref 0.3–1.2)
Total Protein: 6.9 g/dL (ref 6.5–8.1)

## 2020-11-23 MED ORDER — FAMOTIDINE IN NACL 20-0.9 MG/50ML-% IV SOLN
20.0000 mg | Freq: Once | INTRAVENOUS | Status: AC
  Administered 2020-11-23: 20 mg via INTRAVENOUS

## 2020-11-23 MED ORDER — SODIUM CHLORIDE 0.9 % IV SOLN
150.0000 mg | Freq: Once | INTRAVENOUS | Status: AC
  Administered 2020-11-23: 150 mg via INTRAVENOUS
  Filled 2020-11-23: qty 150

## 2020-11-23 MED ORDER — SODIUM CHLORIDE 0.9 % IV SOLN
60.0000 mg/m2 | Freq: Once | INTRAVENOUS | Status: AC
  Administered 2020-11-23: 120 mg via INTRAVENOUS
  Filled 2020-11-23: qty 20

## 2020-11-23 MED ORDER — DIPHENHYDRAMINE HCL 50 MG/ML IJ SOLN
25.0000 mg | Freq: Once | INTRAMUSCULAR | Status: AC
  Administered 2020-11-23: 25 mg via INTRAVENOUS

## 2020-11-23 MED ORDER — FAMOTIDINE IN NACL 20-0.9 MG/50ML-% IV SOLN
INTRAVENOUS | Status: AC
  Filled 2020-11-23: qty 50

## 2020-11-23 MED ORDER — SODIUM CHLORIDE 0.9% FLUSH
10.0000 mL | Freq: Once | INTRAVENOUS | Status: AC
  Administered 2020-11-23: 10 mL
  Filled 2020-11-23: qty 10

## 2020-11-23 MED ORDER — PALONOSETRON HCL INJECTION 0.25 MG/5ML
0.2500 mg | Freq: Once | INTRAVENOUS | Status: AC
  Administered 2020-11-23: 0.25 mg via INTRAVENOUS

## 2020-11-23 MED ORDER — DIPHENHYDRAMINE HCL 50 MG/ML IJ SOLN
INTRAMUSCULAR | Status: AC
  Filled 2020-11-23: qty 1

## 2020-11-23 MED ORDER — PALONOSETRON HCL INJECTION 0.25 MG/5ML
INTRAVENOUS | Status: AC
  Filled 2020-11-23: qty 5

## 2020-11-23 MED ORDER — SODIUM CHLORIDE 0.9 % IV SOLN
Freq: Once | INTRAVENOUS | Status: AC
  Filled 2020-11-23: qty 250

## 2020-11-23 NOTE — Patient Instructions (Signed)
Implanted Port Insertion, Care After This sheet gives you information about how to care for yourself after your procedure. Your health care provider may also give you more specific instructions. If you have problems or questions, contact your health care provider. What can I expect after the procedure? After the procedure, it is common to have:  Discomfort at the port insertion site.  Bruising on the skin over the port. This should improve over 3-4 days. Follow these instructions at home: Port care  After your port is placed, you will get a manufacturer's information card. The card has information about your port. Keep this card with you at all times.  Take care of the port as told by your health care provider. Ask your health care provider if you or a family member can get training for taking care of the port at home. A home health care nurse may also take care of the port.  Make sure to remember what type of port you have. Incision care  Follow instructions from your health care provider about how to take care of your port insertion site. Make sure you: ? Wash your hands with soap and water before and after you change your bandage (dressing). If soap and water are not available, use hand sanitizer. ? Change your dressing as told by your health care provider. ? Leave stitches (sutures), skin glue, or adhesive strips in place. These skin closures may need to stay in place for 2 weeks or longer. If adhesive strip edges start to loosen and curl up, you may trim the loose edges. Do not remove adhesive strips completely unless your health care provider tells you to do that.  Check your port insertion site every day for signs of infection. Check for: ? Redness, swelling, or pain. ? Fluid or blood. ? Warmth. ? Pus or a bad smell.      Activity  Return to your normal activities as told by your health care provider. Ask your health care provider what activities are safe for you.  Do not  lift anything that is heavier than 10 lb (4.5 kg), or the limit that you are told, until your health care provider says that it is safe. General instructions  Take over-the-counter and prescription medicines only as told by your health care provider.  Do not take baths, swim, or use a hot tub until your health care provider approves. Ask your health care provider if you may take showers. You may only be allowed to take sponge baths.  Do not drive for 24 hours if you were given a sedative during your procedure.  Wear a medical alert bracelet in case of an emergency. This will tell any health care providers that you have a port.  Keep all follow-up visits as told by your health care provider. This is important. Contact a health care provider if:  You cannot flush your port with saline as directed, or you cannot draw blood from the port.  You have a fever or chills.  You have redness, swelling, or pain around your port insertion site.  You have fluid or blood coming from your port insertion site.  Your port insertion site feels warm to the touch.  You have pus or a bad smell coming from the port insertion site. Get help right away if:  You have chest pain or shortness of breath.  You have bleeding from your port that you cannot control. Summary  Take care of the port as told by your   health care provider. Keep the manufacturer's information card with you at all times.  Change your dressing as told by your health care provider.  Contact a health care provider if you have a fever or chills or if you have redness, swelling, or pain around your port insertion site.  Keep all follow-up visits as told by your health care provider. This information is not intended to replace advice given to you by your health care provider. Make sure you discuss any questions you have with your health care provider. Document Revised: 04/02/2018 Document Reviewed: 04/02/2018 Elsevier Patient Education   2021 [employer]  

## 2020-11-23 NOTE — Patient Instructions (Signed)
[facility] Discharge Instructions for Patients Receiving Chemotherapy  Today you received the following chemotherapy agents: Paclitaxel (Taxol)  To help prevent nausea and vomiting after your treatment, we encourage you to take your nausea medication as prescribed. If you develop nausea and vomiting that is not controlled by your nausea medication, call the clinic.   BELOW ARE SYMPTOMS THAT SHOULD BE REPORTED IMMEDIATELY:  *FEVER GREATER THAN 100.5 F  *CHILLS WITH OR WITHOUT FEVER  NAUSEA AND VOMITING THAT IS NOT CONTROLLED WITH YOUR NAUSEA MEDICATION  *UNUSUAL SHORTNESS OF BREATH  *UNUSUAL BRUISING OR BLEEDING  TENDERNESS IN MOUTH AND THROAT WITH OR WITHOUT PRESENCE OF ULCERS  *URINARY PROBLEMS  *BOWEL PROBLEMS  UNUSUAL RASH Items with * indicate a potential emergency and should be followed up as soon as possible.  Feel free to call the clinic should you have any questions or concerns. The clinic phone number is [phone].  Please show the CHEMO ALERT CARD at check-in to the Emergency Department and triage nurse.   

## 2020-11-24 ENCOUNTER — Telehealth: Payer: Self-pay | Admitting: Hematology and Oncology

## 2020-11-24 NOTE — Telephone Encounter (Signed)
Scheduled appts per 3/8 los. Pt to get updated appt calendar at next visit per appt notes.

## 2020-11-29 ENCOUNTER — Encounter: Payer: Self-pay | Admitting: *Deleted

## 2020-11-30 ENCOUNTER — Other Ambulatory Visit: Payer: Self-pay

## 2020-11-30 ENCOUNTER — Inpatient Hospital Stay: Payer: Medicaid Other

## 2020-11-30 VITALS — BP 128/85 | HR 80 | Temp 97.9°F | Resp 16 | Wt 214.5 lb

## 2020-11-30 DIAGNOSIS — Z170 Estrogen receptor positive status [ER+]: Secondary | ICD-10-CM

## 2020-11-30 DIAGNOSIS — Z5111 Encounter for antineoplastic chemotherapy: Secondary | ICD-10-CM | POA: Diagnosis not present

## 2020-11-30 DIAGNOSIS — C50411 Malignant neoplasm of upper-outer quadrant of right female breast: Secondary | ICD-10-CM

## 2020-11-30 LAB — CMP (CANCER CENTER ONLY)
ALT: 31 U/L (ref 0–44)
AST: 19 U/L (ref 15–41)
Albumin: 3.8 g/dL (ref 3.5–5.0)
Alkaline Phosphatase: 57 U/L (ref 38–126)
Anion gap: 8 (ref 5–15)
BUN: 14 mg/dL (ref 6–20)
CO2: 23 mmol/L (ref 22–32)
Calcium: 8.8 mg/dL — ABNORMAL LOW (ref 8.9–10.3)
Chloride: 108 mmol/L (ref 98–111)
Creatinine: 0.66 mg/dL (ref 0.44–1.00)
GFR, Estimated: >60 mL/min (ref >60)
Glucose, Bld: 173 mg/dL — ABNORMAL HIGH (ref 70–99)
Potassium: 3.9 mmol/L (ref 3.5–5.1)
Sodium: 139 mmol/L (ref 135–145)
Total Bilirubin: 0.3 mg/dL (ref 0.3–1.2)
Total Protein: 6.6 g/dL (ref 6.5–8.1)

## 2020-11-30 LAB — CBC WITH DIFFERENTIAL (CANCER CENTER ONLY)
Abs Immature Granulocytes: 0.02 10*3/uL (ref 0.00–0.07)
Basophils Absolute: 0.1 10*3/uL (ref 0.0–0.1)
Basophils Relative: 1 %
Eosinophils Absolute: 0.4 10*3/uL (ref 0.0–0.5)
Eosinophils Relative: 7 %
HCT: 32.8 % — ABNORMAL LOW (ref 36.0–46.0)
Hemoglobin: 10.8 g/dL — ABNORMAL LOW (ref 12.0–15.0)
Immature Granulocytes: 0 %
Lymphocytes Relative: 21 %
Lymphs Abs: 1.2 10*3/uL (ref 0.7–4.0)
MCH: 31.3 pg (ref 26.0–34.0)
MCHC: 32.9 g/dL (ref 30.0–36.0)
MCV: 95.1 fL (ref 80.0–100.0)
Monocytes Absolute: 0.4 10*3/uL (ref 0.1–1.0)
Monocytes Relative: 7 %
Neutro Abs: 3.6 10*3/uL (ref 1.7–7.7)
Neutrophils Relative %: 64 %
Platelet Count: 222 10*3/uL (ref 150–400)
RBC: 3.45 MIL/uL — ABNORMAL LOW (ref 3.87–5.11)
RDW: 17.1 % — ABNORMAL HIGH (ref 11.5–15.5)
WBC Count: 5.6 10*3/uL (ref 4.0–10.5)
nRBC: 0 % (ref 0.0–0.2)

## 2020-11-30 MED ORDER — FAMOTIDINE IN NACL 20-0.9 MG/50ML-% IV SOLN
INTRAVENOUS | Status: AC
  Filled 2020-11-30: qty 50

## 2020-11-30 MED ORDER — PALONOSETRON HCL INJECTION 0.25 MG/5ML
INTRAVENOUS | Status: AC
  Filled 2020-11-30: qty 5

## 2020-11-30 MED ORDER — DIPHENHYDRAMINE HCL 50 MG/ML IJ SOLN
INTRAMUSCULAR | Status: AC
  Filled 2020-11-30: qty 1

## 2020-11-30 MED ORDER — PALONOSETRON HCL INJECTION 0.25 MG/5ML
0.2500 mg | Freq: Once | INTRAVENOUS | Status: AC
  Administered 2020-11-30: 0.25 mg via INTRAVENOUS

## 2020-11-30 MED ORDER — HEPARIN SOD (PORK) LOCK FLUSH 100 UNIT/ML IV SOLN
500.0000 [IU] | Freq: Once | INTRAVENOUS | Status: AC | PRN
  Administered 2020-11-30: 500 [IU]
  Filled 2020-11-30: qty 5

## 2020-11-30 MED ORDER — FOSAPREPITANT DIMEGLUMINE INJECTION 150 MG
150.0000 mg | Freq: Once | INTRAVENOUS | Status: AC
  Administered 2020-11-30: 150 mg via INTRAVENOUS
  Filled 2020-11-30: qty 150

## 2020-11-30 MED ORDER — SODIUM CHLORIDE 0.9 % IV SOLN
Freq: Once | INTRAVENOUS | Status: AC
  Filled 2020-11-30: qty 250

## 2020-11-30 MED ORDER — DIPHENHYDRAMINE HCL 50 MG/ML IJ SOLN
25.0000 mg | Freq: Once | INTRAMUSCULAR | Status: AC
  Administered 2020-11-30: 25 mg via INTRAVENOUS

## 2020-11-30 MED ORDER — SODIUM CHLORIDE 0.9% FLUSH
10.0000 mL | INTRAVENOUS | Status: DC | PRN
  Administered 2020-11-30: 10 mL
  Filled 2020-11-30: qty 10

## 2020-11-30 MED ORDER — SODIUM CHLORIDE 0.9 % IV SOLN
60.0000 mg/m2 | Freq: Once | INTRAVENOUS | Status: AC
  Administered 2020-11-30: 120 mg via INTRAVENOUS
  Filled 2020-11-30: qty 20

## 2020-11-30 MED ORDER — FAMOTIDINE IN NACL 20-0.9 MG/50ML-% IV SOLN
20.0000 mg | Freq: Once | INTRAVENOUS | Status: AC
  Administered 2020-11-30: 20 mg via INTRAVENOUS

## 2020-11-30 NOTE — Patient Instructions (Signed)
[facility] Discharge Instructions for Patients Receiving Chemotherapy  Today you received the following chemotherapy agents: paclitaxel.  To help prevent nausea and vomiting after your treatment, we encourage you to take your nausea medication as directed.   If you develop nausea and vomiting that is not controlled by your nausea medication, call the clinic.   BELOW ARE SYMPTOMS THAT SHOULD BE REPORTED IMMEDIATELY:  *FEVER GREATER THAN 100.5 F  *CHILLS WITH OR WITHOUT FEVER  NAUSEA AND VOMITING THAT IS NOT CONTROLLED WITH YOUR NAUSEA MEDICATION  *UNUSUAL SHORTNESS OF BREATH  *UNUSUAL BRUISING OR BLEEDING  TENDERNESS IN MOUTH AND THROAT WITH OR WITHOUT PRESENCE OF ULCERS  *URINARY PROBLEMS  *BOWEL PROBLEMS  UNUSUAL RASH Items with * indicate a potential emergency and should be followed up as soon as possible.  Feel free to call the clinic should you have any questions or concerns. The clinic phone number is [phone].  Please show the CHEMO ALERT CARD at check-in to the Emergency Department and triage nurse.   

## 2020-12-06 NOTE — Progress Notes (Signed)
Patient Care Team: Patient, [name] as PCP - General (General Practice) Rockwell Germany, RN as Oncology Nurse Navigator Mauro Kaufmann, RN as Oncology Nurse Navigator [name], MD as Consulting Physician (General Surgery) Nicholas Lose, MD as Consulting Physician (Hematology and Oncology) Gery Pray, MD as Consulting Physician (Radiation Oncology)  DIAGNOSIS:    ICD-10-CM   1. Malignant neoplasm of upper-outer quadrant of right breast in female, estrogen receptor positive ([place])  C50.411    Z17.0     SUMMARY OF ONCOLOGIC HISTORY: Oncology History  Malignant neoplasm of upper-outer quadrant of right breast in female, estrogen receptor positive ([place])  07/28/2020 Initial Diagnosis   Patient palpated a right breast mass for 1-2 years. Mammogram showed a 2.2cm mass at the 11 o'clock position with surrounding calcifications, 6.4cm in total extent, and up to 5 abnormal right axillary lymph nodes. 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    08/05/2020 Miscellaneous   MammaPrint: High risk luminal type B   08/11/2020 Genetic Testing   Negative genetic testing: no pathogenic variants detected in [facility] Breast Cancer STAT Panel or Common Hereditary Cancers panel. The report dates are August 11, 2020 and August 19, 2020, respectively. Two variants of uncertain signficance were detected - one in the CTNNA1 gene called c.86del and the second in the MLH1 gene called c.808A>G.   The STAT Breast cancer panel offered by [facility] includes sequencing and rearrangement analysis for the following 9 genes:  ATM, BRCA1, BRCA2, CDH1, CHEK2, PALB2, PTEN, STK11 and TP53.  The Common Hereditary Cancers Panel offered by [facility] includes sequencing and/or deletion duplication testing of the following 48 genes: APC, ATM, AXIN2, BARD1, BMPR1A, BRCA1, BRCA2, BRIP1,  CDH1, CDK4, CDKN2A (p14ARF), CDKN2A (p16INK4a), CHEK2, CTNNA1, DICER1, EPCAM (Deletion/duplication testing only), GREM1 (promoter region deletion/duplication testing only), KIT, MEN1, MLH1, MSH2, MSH3, MSH6, MUTYH, NBN, NF1, NTHL1, PALB2, PDGFRA, PMS2, POLD1, POLE, PTEN, RAD50, RAD51C, RAD51D, RNF43, SDHB, SDHC, SDHD, SMAD4, SMARCA4. STK11, TP53, TSC1, TSC2, and VHL.  The following genes were evaluated for sequence changes only: SDHA and HOXB13 c.251G>A variant only.    09/01/2020 -  Chemotherapy    Patient is on Treatment Plan: BREAST DOSE DENSE AC Q14D / PACLITAXEL D1,8,15 Q21D (CARBO REMOVED BY DR. Lindi Adie)        CHIEF COMPLIANT: Cycle7Taxol  INTERVAL HISTORY: Allysha Tryon is a 45 y.o. with above-mentioned history of right breast cancercurrently on neoadjuvant chemotherapy withweekly Taxol after completing four cycles ofdose dense Adriamycin and Cytoxan.She presents to the clinic todayfortoxicity checkandcycle7.    ALLERGIES:  is allergic to latex.  MEDICATIONS:  Current Outpatient Medications  Medication Sig Dispense Refill   cyclobenzaprine (FLEXERIL) 5 MG tablet Take 1 tablet (5 mg total) by mouth 3 (three) times daily as needed for muscle spasms. 20 tablet 0   Investigational dexamethasone 4 MG tablet [facility] 16070 Blister Card 2 Take 2 tablets by mouth daily. Take in the morning on Days 2-4. 6 tablet 0   Investigational olanzapine/placebo 10 MG capsule U[facility] 16070 Blister Card 2 Take 1 capsule by mouth daily. Take in the morning on Days 2-4. 3 capsule 0   Investigational prochlorperazine maleate/placebo 10 MG capsule URCC 16070 Blister Card 2 Take 1 capsule by mouth every 8 (eight) hours. Take on Days 1-4. 11 capsule 0   lidocaine-prilocaine (EMLA) cream Apply to affected area once 30  g 3   loratadine (CLARITIN) 10 MG tablet Take 10 mg by mouth daily.     ondansetron (ZOFRAN) 8 MG tablet Take 1 tablet (8 mg total) by mouth 2 (two) times daily as needed. Start on the  third day after chemotherapy. 30 tablet 2   pantoprazole (PROTONIX) 20 MG tablet Take 1 tablet (20 mg total) by mouth daily. 30 tablet 1   venlafaxine XR (EFFEXOR-XR) 37.5 MG 24 hr capsule Take 1 capsule (37.5 mg total) by mouth daily with breakfast. 30 capsule 6   No current facility-administered medications for this visit.    PHYSICAL EXAMINATION: ECOG PERFORMANCE STATUS: 1 - Symptomatic but completely ambulatory  There were no vitals filed for this visit. There were no vitals filed for this visit.   LABORATORY DATA:  I have reviewed the data as listed CMP Latest Ref Rng & Units 11/30/2020 11/23/2020 11/16/2020  Glucose 70 - 99 mg/dL 173(H) 174(H) 166(H)  BUN 6 - 20 mg/dL _0 Creatinine 0.44 - 1.00 mg/dL 0.66 0.66 0.67  Sodium 135 - 145 mmol/L 139 138 139  Potassium 3.5 - 5.1 mmol/L 3.9 4.2 3.9  Chloride 98 - 111 mmol/L 108 106 109  CO2 22 - 32 mmol/L 23 20(L) 22  Calcium 8.9 - 10.3 mg/dL 8.8(L) 8.9 8.6(L)  Total Protein 6.5 - 8.1 g/dL 6.6 6.9 6.5  Total Bilirubin 0.3 - 1.2 mg/dL 0.3 0.3 0.2(L)  Alkaline Phos 38 - 126 U/L 57 59 59  AST 15 - 41 U/L _1 ALT 0 - 44 U/L 31 32 25    Lab Results  Component Value Date   WBC 5.6 11/30/2020   HGB 10.8 (L) 11/30/2020   HCT 32.8 (L) 11/30/2020   MCV 95.1 11/30/2020   PLT 222 11/30/2020   NEUTROABS 3.6 11/30/2020    ASSESSMENT & PLAN:  Malignant neoplasm of upper-outer quadrant of right breast in female, estrogen receptor positive ([place]) 07/28/2020:Patient palpated a right breast mass for 1-2 years. Mammogram showed a 2.2cm mass at the 11 o'clock position with surrounding calcifications, 6.4cm in total extent, and up to 5 abnormal right axillary lymph nodes. 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  Treatment plan:  1. Neoadjuvant chemotherapy (MammaPrint test High Risk): AC foll by  Taxol  2. mastectomy versus breast conserving surgery with targeted node dissection  3. Adjuvant radiation therapy  4. Follow-up adjuvant antiestrogen therapy  [facility] 16070: Treatment of refractory nausea  -------------------------------------------------------------------------------------------------------------------------------  Current Treatment: completed 4 cycles of Adriamycin and Cytoxan, today is cycle 7 Taxol  Chemo toxicities:  1. Refractory nausea/vomiting: she is enrolled in [place] study. No further nausea issues  2. Alopecia  3. Constipation: Rec to use miralax if needed.  4. severe hot flashes: Slightly better on Effexor  5.  Mild peripheral neuropathy: Monitoring closely.  Weight gain: Patient has been gaining significant amount of weight.  I discussed with her about monitoring her calorie intake.  Monitoring closely for toxicities.  RTC weekly for taxol and every other week for follow up with me   No orders of the defined types were placed in this encounter.  The patient has a good understanding of the overall plan. she agrees with it. she will call with any problems that may develop before the next visit here.  Total time spent: 30 mins including  face to face time and time spent for planning, charting and coordination of care  Rulon Eisenmenger, MD, MPH 12/07/2020  I, Molly Dorshimer, am acting as scribe for Dr. Nicholas Lose.  I have reviewed the above documentation for accuracy and completeness, and I agree with the above.

## 2020-12-07 ENCOUNTER — Inpatient Hospital Stay (HOSPITAL_BASED_OUTPATIENT_CLINIC_OR_DEPARTMENT_OTHER): Payer: Medicaid Other | Admitting: Hematology and Oncology

## 2020-12-07 ENCOUNTER — Inpatient Hospital Stay: Payer: Medicaid Other

## 2020-12-07 ENCOUNTER — Encounter: Payer: Self-pay | Admitting: Emergency Medicine

## 2020-12-07 ENCOUNTER — Other Ambulatory Visit: Payer: Self-pay

## 2020-12-07 DIAGNOSIS — C50411 Malignant neoplasm of upper-outer quadrant of right female breast: Secondary | ICD-10-CM

## 2020-12-07 DIAGNOSIS — Z170 Estrogen receptor positive status [ER+]: Secondary | ICD-10-CM

## 2020-12-07 DIAGNOSIS — Z5111 Encounter for antineoplastic chemotherapy: Secondary | ICD-10-CM | POA: Diagnosis not present

## 2020-12-07 LAB — CBC WITH DIFFERENTIAL (CANCER CENTER ONLY)
Abs Immature Granulocytes: 0.02 10*3/uL (ref 0.00–0.07)
Basophils Absolute: 0.1 10*3/uL (ref 0.0–0.1)
Basophils Relative: 1 %
Eosinophils Absolute: 0.3 10*3/uL (ref 0.0–0.5)
Eosinophils Relative: 5 %
HCT: 33.5 % — ABNORMAL LOW (ref 36.0–46.0)
Hemoglobin: 11.1 g/dL — ABNORMAL LOW (ref 12.0–15.0)
Immature Granulocytes: 0 %
Lymphocytes Relative: 20 %
Lymphs Abs: 1.2 10*3/uL (ref 0.7–4.0)
MCH: 32.1 pg (ref 26.0–34.0)
MCHC: 33.1 g/dL (ref 30.0–36.0)
MCV: 96.8 fL (ref 80.0–100.0)
Monocytes Absolute: 0.4 10*3/uL (ref 0.1–1.0)
Monocytes Relative: 6 %
Neutro Abs: 4.1 10*3/uL (ref 1.7–7.7)
Neutrophils Relative %: 68 %
Platelet Count: 233 10*3/uL (ref 150–400)
RBC: 3.46 MIL/uL — ABNORMAL LOW (ref 3.87–5.11)
RDW: 15.9 % — ABNORMAL HIGH (ref 11.5–15.5)
WBC Count: 6.1 10*3/uL (ref 4.0–10.5)
nRBC: 0 % (ref 0.0–0.2)

## 2020-12-07 LAB — CMP (CANCER CENTER ONLY)
ALT: 26 U/L (ref 0–44)
AST: 17 U/L (ref 15–41)
Albumin: 3.9 g/dL (ref 3.5–5.0)
Alkaline Phosphatase: 54 U/L (ref 38–126)
Anion gap: 12 (ref 5–15)
BUN: 15 mg/dL (ref 6–20)
CO2: 22 mmol/L (ref 22–32)
Calcium: 9 mg/dL (ref 8.9–10.3)
Chloride: 107 mmol/L (ref 98–111)
Creatinine: 0.74 mg/dL (ref 0.44–1.00)
GFR, Estimated: >60 mL/min (ref >60)
Glucose, Bld: 208 mg/dL — ABNORMAL HIGH (ref 70–99)
Potassium: 4 mmol/L (ref 3.5–5.1)
Sodium: 141 mmol/L (ref 135–145)
Total Bilirubin: 0.3 mg/dL (ref 0.3–1.2)
Total Protein: 6.6 g/dL (ref 6.5–8.1)

## 2020-12-07 MED ORDER — SODIUM CHLORIDE 0.9% FLUSH
10.0000 mL | INTRAVENOUS | Status: DC | PRN
  Administered 2020-12-07: 10 mL
  Filled 2020-12-07: qty 10

## 2020-12-07 MED ORDER — SODIUM CHLORIDE 0.9 % IV SOLN
60.0000 mg/m2 | Freq: Once | INTRAVENOUS | Status: AC
  Administered 2020-12-07: 120 mg via INTRAVENOUS
  Filled 2020-12-07: qty 20

## 2020-12-07 MED ORDER — DIPHENHYDRAMINE HCL 25 MG PO CAPS
ORAL_CAPSULE | ORAL | Status: AC
  Filled 2020-12-07: qty 1

## 2020-12-07 MED ORDER — FAMOTIDINE IN NACL 20-0.9 MG/50ML-% IV SOLN
20.0000 mg | Freq: Once | INTRAVENOUS | Status: AC
  Administered 2020-12-07: 20 mg via INTRAVENOUS

## 2020-12-07 MED ORDER — FOSAPREPITANT DIMEGLUMINE INJECTION 150 MG
150.0000 mg | Freq: Once | INTRAVENOUS | Status: AC
  Administered 2020-12-07: 150 mg via INTRAVENOUS
  Filled 2020-12-07: qty 150

## 2020-12-07 MED ORDER — DIPHENHYDRAMINE HCL 50 MG/ML IJ SOLN
25.0000 mg | Freq: Once | INTRAMUSCULAR | Status: AC
  Administered 2020-12-07: 25 mg via INTRAVENOUS

## 2020-12-07 MED ORDER — PALONOSETRON HCL INJECTION 0.25 MG/5ML
0.2500 mg | Freq: Once | INTRAVENOUS | Status: AC
  Administered 2020-12-07: 0.25 mg via INTRAVENOUS

## 2020-12-07 MED ORDER — DIPHENHYDRAMINE HCL 50 MG/ML IJ SOLN
INTRAMUSCULAR | Status: AC
  Filled 2020-12-07: qty 1

## 2020-12-07 MED ORDER — FAMOTIDINE IN NACL 20-0.9 MG/50ML-% IV SOLN
INTRAVENOUS | Status: AC
  Filled 2020-12-07: qty 50

## 2020-12-07 MED ORDER — PALONOSETRON HCL INJECTION 0.25 MG/5ML
INTRAVENOUS | Status: AC
  Filled 2020-12-07: qty 5

## 2020-12-07 MED ORDER — SODIUM CHLORIDE 0.9 % IV SOLN
Freq: Once | INTRAVENOUS | Status: AC
  Filled 2020-12-07: qty 250

## 2020-12-07 MED ORDER — HEPARIN SOD (PORK) LOCK FLUSH 100 UNIT/ML IV SOLN
500.0000 [IU] | Freq: Once | INTRAVENOUS | Status: AC | PRN
  Administered 2020-12-07: 500 [IU]
  Filled 2020-12-07: qty 5

## 2020-12-07 NOTE — Progress Notes (Signed)
[id number] - TREATMENT OF REFRACTORY NAUSEA  12/07/20  Received in the mail yesterday (12/06/2020) the original medication diary and four day home record filled out by patient after cycle 2.  The envelope was postmarked 09/23/2020.  Study sponsor was notified and EDC was updated as advised.  Clabe Seal Clinical Research Coordinator I  12/07/20 10:57 AM

## 2020-12-07 NOTE — Patient Instructions (Signed)
[facility] Discharge Instructions for Patients Receiving Chemotherapy  Today you received the following chemotherapy agents: paclitaxel.  To help prevent nausea and vomiting after your treatment, we encourage you to take your nausea medication as directed.   If you develop nausea and vomiting that is not controlled by your nausea medication, call the clinic.   BELOW ARE SYMPTOMS THAT SHOULD BE REPORTED IMMEDIATELY:  *FEVER GREATER THAN 100.5 F  *CHILLS WITH OR WITHOUT FEVER  NAUSEA AND VOMITING THAT IS NOT CONTROLLED WITH YOUR NAUSEA MEDICATION  *UNUSUAL SHORTNESS OF BREATH  *UNUSUAL BRUISING OR BLEEDING  TENDERNESS IN MOUTH AND THROAT WITH OR WITHOUT PRESENCE OF ULCERS  *URINARY PROBLEMS  *BOWEL PROBLEMS  UNUSUAL RASH Items with * indicate a potential emergency and should be followed up as soon as possible.  Feel free to call the clinic should you have any questions or concerns. The clinic phone number is [phone].  Please show the CHEMO ALERT CARD at check-in to the Emergency Department and triage nurse.   

## 2020-12-07 NOTE — Assessment & Plan Note (Signed)
07/28/2020:Patient palpated a right breast mass for 1-2 years. Mammogram showed a 2.2cm mass at the 11 o'clock position with surrounding calcifications, 6.4cm in total extent, and up to 5 abnormal right axillary lymph nodes. 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  Treatment plan:  1. Neoadjuvant chemotherapy (MammaPrint test High Risk): AC foll by Taxol  2. mastectomy versus breast conserving surgery with targeted node dissection  3. Adjuvant radiation therapy  4. Follow-up adjuvant antiestrogen therapy  URCC 16070: Treatment of refractory nausea  -------------------------------------------------------------------------------------------------------------------------------  Current Treatment: completed 4 cycles of Adriamycin and Cytoxan, today is cycle 7 Taxol  Chemo toxicities:  1. Refractory nausea/vomiting: she is enrolled in [place] study. No further nausea issues  2. Alopecia  3. Constipation: Rec to use miralax if needed.  4. severe hot flashes: Slightly better on Effexor   Monitoring closely for toxicities.  RTC weekly for taxol and every other week for follow up with me

## 2020-12-13 NOTE — Assessment & Plan Note (Signed)
07/28/2020:Patient palpated a right breast mass for 1-2 years. Mammogram showed a 2.2cm mass at the 11 o'clock position with surrounding calcifications, 6.4cm in total extent, and up to 5 abnormal right axillary lymph nodes. 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  Treatment plan:  1. Neoadjuvant chemotherapy (MammaPrint test High Risk): AC foll by Taxol  2. mastectomy versus breast conserving surgery with targeted node dissection  3. Adjuvant radiation therapy  4. Follow-up adjuvant antiestrogen therapy  URCC 16070: Treatment of refractory nausea  -------------------------------------------------------------------------------------------------------------------------------  Current Treatment: completed 4 cycles of Adriamycin and Cytoxan, today is cycle 9 Taxol  Chemo toxicities:  1. Refractory nausea/vomiting: she is enrolled in [place] study. No further nausea issues  2. Alopecia  3. Constipation: Rec to use miralax if needed.  4. severe hot flashes: Slightly better on Effexor  5.  Mild peripheral neuropathy: Monitoring closely.  Weight gain: Patient has been gaining significant amount of weight.  I discussed with her about monitoring her calorie intake.  Monitoring closely for toxicities.  RTC weekly for taxol and every other week for follow up with me

## 2020-12-13 NOTE — Progress Notes (Signed)
Patient Care Team: Patient, [name] as PCP - General (General Practice) Rockwell Germany, RN as Oncology Nurse Navigator Mauro Kaufmann, RN as Oncology Nurse Navigator [name], MD as Consulting Physician (General Surgery) Nicholas Lose, MD as Consulting Physician (Hematology and Oncology) Gery Pray, MD as Consulting Physician (Radiation Oncology)  DIAGNOSIS:    ICD-10-CM   1. Malignant neoplasm of upper-outer quadrant of right breast in female, estrogen receptor positive ([place])  C50.411    Z17.0     SUMMARY OF ONCOLOGIC HISTORY: Oncology History  Malignant neoplasm of upper-outer quadrant of right breast in female, estrogen receptor positive ([place])  07/28/2020 Initial Diagnosis   Patient palpated a right breast mass for 1-2 years. Mammogram showed a 2.2cm mass at the 11 o'clock position with surrounding calcifications, 6.4cm in total extent, and up to 5 abnormal right axillary lymph nodes. 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    08/05/2020 Miscellaneous   MammaPrint: High risk luminal type B   08/11/2020 Genetic Testing   Negative genetic testing: no pathogenic variants detected in [facility] Breast Cancer STAT Panel or Common Hereditary Cancers panel. The report dates are August 11, 2020 and August 19, 2020, respectively. Two variants of uncertain signficance were detected - one in the CTNNA1 gene called c.86del and the second in the MLH1 gene called c.808A>G.   The STAT Breast cancer panel offered by [facility] includes sequencing and rearrangement analysis for the following 9 genes:  ATM, BRCA1, BRCA2, CDH1, CHEK2, PALB2, PTEN, STK11 and TP53.  The Common Hereditary Cancers Panel offered by [facility] includes sequencing and/or deletion duplication testing of the following 48 genes: APC, ATM, AXIN2, BARD1, BMPR1A, BRCA1, BRCA2, BRIP1,  CDH1, CDK4, CDKN2A (p14ARF), CDKN2A (p16INK4a), CHEK2, CTNNA1, DICER1, EPCAM (Deletion/duplication testing only), GREM1 (promoter region deletion/duplication testing only), KIT, MEN1, MLH1, MSH2, MSH3, MSH6, MUTYH, NBN, NF1, NTHL1, PALB2, PDGFRA, PMS2, POLD1, POLE, PTEN, RAD50, RAD51C, RAD51D, RNF43, SDHB, SDHC, SDHD, SMAD4, SMARCA4. STK11, TP53, TSC1, TSC2, and VHL.  The following genes were evaluated for sequence changes only: SDHA and HOXB13 c.251G>A variant only.    09/01/2020 -  Chemotherapy    Patient is on Treatment Plan: BREAST DOSE DENSE AC Q14D / PACLITAXEL D1,8,15 Q21D (CARBO REMOVED BY DR. Lindi Adie)        CHIEF COMPLIANT: Cycle8Taxol  INTERVAL HISTORY: Melody Rodriguez is a 45 y.o. with above-mentioned history of right breast cancercurrently on neoadjuvant chemotherapy withweekly Taxol after completing four cycles ofdose dense Adriamycin and Cytoxan.She presents to the clinic todayfortoxicity checkandcycle8.  She tolerated last cycle of chemo fairly well.  She has mild numbness of the tips of her toes.  She went to [facility] and back in a few days by driving in a car and subsequently her bilateral lower extremity edema.  She tells me that she gets the leg swelling constantly especially when she travels long distances and thinks that it is already starting to get better.  She does continue to have fatigue issues.  Mild neuropathy in the tips of her toes.  ALLERGIES:  is allergic to latex.  MEDICATIONS:  Current Outpatient Medications  Medication Sig Dispense Refill  . cyclobenzaprine (FLEXERIL) 5 MG tablet Take 1 tablet (5 mg total) by mouth 3 (three) times daily as needed for muscle spasms. 20 tablet 0  . Investigational dexamethasone 4 MG tablet [facility]RCC 16070 Blister Card  2 Take 2 tablets by mouth daily. Take in the morning on Days 2-4. 6 tablet 0  . Investigational olanzapine/placebo 10 MG capsule URCC 16070 Blister Card 2 Take 1 capsule by mouth daily. Take in the morning on Days  2-4. 3 capsule 0  . Investigational prochlorperazine maleate/placebo 10 MG capsule [facility]RCC 16070 Blister Card 2 Take 1 capsule by mouth every 8 (eight) hours. Take on Days 1-4. 11 capsule 0  . lidocaine-prilocaine (EMLA) cream Apply to affected area once 30 g 3  . loratadine (CLARITIN) 10 MG tablet Take 10 mg by mouth daily.    . ondansetron (ZOFRAN) 8 MG tablet Take 1 tablet (8 mg total) by mouth 2 (two) times daily as needed. Start on the third day after chemotherapy. 30 tablet 2  . pantoprazole (PROTONIX) 20 MG tablet Take 1 tablet (20 mg total) by mouth daily. 30 tablet 1  . venlafaxine XR (EFFEXOR-XR) 37.5 MG 24 hr capsule Take 1 capsule (37.5 mg total) by mouth daily with breakfast. 30 capsule 6   No current facility-administered medications for this visit.    PHYSICAL EXAMINATION: ECOG PERFORMANCE STATUS: 1 - Symptomatic but completely ambulatory  Vitals:   12/14/20 0820  BP: 123/69  Pulse: 83  Resp: 18  Temp: (!) 97.5 F (36.4 C)  SpO2: 98%   Filed Weights   12/14/20 0820  Weight: 220 lb 8 oz (100 kg)    LABORATORY DATA:  I have reviewed the data as listed CMP Latest Ref Rng & Units 12/07/2020 11/30/2020 11/23/2020  Glucose 70 - 99 mg/dL 208(H) 173(H) 174(H)  BUN 6 - 20 mg/dL _0 Creatinine 0.44 - 1.00 mg/dL 0.74 0.66 0.66  Sodium 135 - 145 mmol/L 141 139 138  Potassium 3.5 - 5.1 mmol/L 4.0 3.9 4.2  Chloride 98 - 111 mmol/L 107 108 106  CO2 22 - 32 mmol/L 22 23 20(L)  Calcium 8.9 - 10.3 mg/dL 9.0 8.8(L) 8.9  Total Protein 6.5 - 8.1 g/dL 6.6 6.6 6.9  Total Bilirubin 0.3 - 1.2 mg/dL 0.3 0.3 0.3  Alkaline Phos 38 - 126 U/L 54 57 59  AST 15 - 41 U/L _1 ALT 0 - 44 U/L 26 31 32    Lab Results  Component Value Date   WBC 5.8 12/14/2020   HGB 11.1 (L) 12/14/2020   HCT 33.3 (L) 12/14/2020   MCV 96.5 12/14/2020   PLT 223 12/14/2020   NEUTROABS 3.9 12/14/2020    ASSESSMENT & PLAN:  Malignant neoplasm of upper-outer quadrant of right breast in female,  estrogen receptor positive ([place]) 07/28/2020:Patient palpated a right breast mass for 1-2 years. Mammogram showed a 2.2cm mass at the 11 o'clock position with surrounding calcifications, 6.4cm in total extent, and up to 5 abnormal right axillary lymph nodes. 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  Treatment plan:  1. Neoadjuvant chemotherapy (MammaPrint test High Risk): AC foll by Taxol  2. mastectomy versus breast conserving surgery with targeted node dissection  3. Adjuvant radiation therapy  4. Follow-up adjuvant antiestrogen therapy  URCC 16070: Treatment of refractory nausea  -------------------------------------------------------------------------------------------------------------------------------  Current Treatment: completed 4 cycles of Adriamycin and Cytoxan, today is cycle 8 Taxol  Chemo toxicities:  1.nausea/vomiting: she is enrolled in [place] study. No current nausea issues  2. Alopecia  3. Constipation: Rec to use miralax  if needed.  4. severe hot flashes: Slightly better on Effexor  5.  Mild peripheral neuropathy: Monitoring closely.  Weight gain: Patient has been gaining significant amount of weight.  I discussed with her about monitoring her calorie intake. Bilateral lower extremity edema: Secondary to long distance travel to [place] and back.  Since it is bilateral and it is not very tender I will start her on Lasix today.  If her symptoms do not get better we may have to get an ultrasound of the legs.  Monitoring closely for toxicities.  RTC weekly for taxol and every other week for follow up with me     No orders of the defined types were placed in this encounter.  The patient has a good understanding of the overall plan. she agrees with it. she will call with any problems that may develop before the next visit  here.  Total time spent: 30 mins including face to face time and time spent for planning, charting and coordination of care  Rulon Eisenmenger, MD, MPH 12/14/2020  I, Cloyde Reams Dorshimer, am acting as scribe for Dr. Nicholas Lose.  I have reviewed the above documentation for accuracy and completeness, and I agree with the above.

## 2020-12-14 ENCOUNTER — Inpatient Hospital Stay: Payer: Medicaid Other

## 2020-12-14 ENCOUNTER — Inpatient Hospital Stay (HOSPITAL_BASED_OUTPATIENT_CLINIC_OR_DEPARTMENT_OTHER): Payer: Medicaid Other | Admitting: Hematology and Oncology

## 2020-12-14 ENCOUNTER — Other Ambulatory Visit: Payer: Self-pay | Admitting: Hematology and Oncology

## 2020-12-14 ENCOUNTER — Other Ambulatory Visit: Payer: Self-pay

## 2020-12-14 ENCOUNTER — Encounter: Payer: Self-pay | Admitting: *Deleted

## 2020-12-14 DIAGNOSIS — D500 Iron deficiency anemia secondary to blood loss (chronic): Secondary | ICD-10-CM

## 2020-12-14 DIAGNOSIS — Z170 Estrogen receptor positive status [ER+]: Secondary | ICD-10-CM | POA: Diagnosis not present

## 2020-12-14 DIAGNOSIS — Z5111 Encounter for antineoplastic chemotherapy: Secondary | ICD-10-CM | POA: Diagnosis not present

## 2020-12-14 DIAGNOSIS — C50411 Malignant neoplasm of upper-outer quadrant of right female breast: Secondary | ICD-10-CM | POA: Diagnosis not present

## 2020-12-14 DIAGNOSIS — Z452 Encounter for adjustment and management of vascular access device: Secondary | ICD-10-CM

## 2020-12-14 LAB — CBC WITH DIFFERENTIAL (CANCER CENTER ONLY)
Abs Immature Granulocytes: 0.02 10*3/uL (ref 0.00–0.07)
Basophils Absolute: 0 10*3/uL (ref 0.0–0.1)
Basophils Relative: 1 %
Eosinophils Absolute: 0.3 10*3/uL (ref 0.0–0.5)
Eosinophils Relative: 5 %
HCT: 33.3 % — ABNORMAL LOW (ref 36.0–46.0)
Hemoglobin: 11.1 g/dL — ABNORMAL LOW (ref 12.0–15.0)
Immature Granulocytes: 0 %
Lymphocytes Relative: 18 %
Lymphs Abs: 1.1 10*3/uL (ref 0.7–4.0)
MCH: 32.2 pg (ref 26.0–34.0)
MCHC: 33.3 g/dL (ref 30.0–36.0)
MCV: 96.5 fL (ref 80.0–100.0)
Monocytes Absolute: 0.4 10*3/uL (ref 0.1–1.0)
Monocytes Relative: 7 %
Neutro Abs: 3.9 10*3/uL (ref 1.7–7.7)
Neutrophils Relative %: 69 %
Platelet Count: 223 10*3/uL (ref 150–400)
RBC: 3.45 MIL/uL — ABNORMAL LOW (ref 3.87–5.11)
RDW: 15 % (ref 11.5–15.5)
WBC Count: 5.8 10*3/uL (ref 4.0–10.5)
nRBC: 0 % (ref 0.0–0.2)

## 2020-12-14 LAB — CMP (CANCER CENTER ONLY)
ALT: 29 U/L (ref 0–44)
AST: 23 U/L (ref 15–41)
Albumin: 3.9 g/dL (ref 3.5–5.0)
Alkaline Phosphatase: 51 U/L (ref 38–126)
Anion gap: 12 (ref 5–15)
BUN: 11 mg/dL (ref 6–20)
CO2: 23 mmol/L (ref 22–32)
Calcium: 8.3 mg/dL — ABNORMAL LOW (ref 8.9–10.3)
Chloride: 106 mmol/L (ref 98–111)
Creatinine: 0.66 mg/dL (ref 0.44–1.00)
GFR, Estimated: >60 mL/min (ref >60)
Glucose, Bld: 174 mg/dL — ABNORMAL HIGH (ref 70–99)
Potassium: 3.6 mmol/L (ref 3.5–5.1)
Sodium: 141 mmol/L (ref 135–145)
Total Bilirubin: 0.5 mg/dL (ref 0.3–1.2)
Total Protein: 6.5 g/dL (ref 6.5–8.1)

## 2020-12-14 MED ORDER — SODIUM CHLORIDE 0.9 % IV SOLN
60.0000 mg/m2 | Freq: Once | INTRAVENOUS | Status: AC
  Administered 2020-12-14: 120 mg via INTRAVENOUS
  Filled 2020-12-14: qty 20

## 2020-12-14 MED ORDER — DIPHENHYDRAMINE HCL 50 MG/ML IJ SOLN
25.0000 mg | Freq: Once | INTRAMUSCULAR | Status: AC
  Administered 2020-12-14: 25 mg via INTRAVENOUS

## 2020-12-14 MED ORDER — FOSAPREPITANT DIMEGLUMINE INJECTION 150 MG
150.0000 mg | Freq: Once | INTRAVENOUS | Status: AC
  Administered 2020-12-14: 150 mg via INTRAVENOUS
  Filled 2020-12-14: qty 150

## 2020-12-14 MED ORDER — FAMOTIDINE IN NACL 20-0.9 MG/50ML-% IV SOLN
INTRAVENOUS | Status: AC
  Filled 2020-12-14: qty 50

## 2020-12-14 MED ORDER — SODIUM CHLORIDE 0.9% FLUSH
10.0000 mL | Freq: Once | INTRAVENOUS | Status: AC
  Administered 2020-12-14: 10 mL
  Filled 2020-12-14: qty 10

## 2020-12-14 MED ORDER — SODIUM CHLORIDE 0.9 % IV SOLN
Freq: Once | INTRAVENOUS | Status: AC
  Filled 2020-12-14: qty 250

## 2020-12-14 MED ORDER — FAMOTIDINE IN NACL 20-0.9 MG/50ML-% IV SOLN
20.0000 mg | Freq: Once | INTRAVENOUS | Status: AC
  Administered 2020-12-14: 20 mg via INTRAVENOUS

## 2020-12-14 MED ORDER — DIPHENHYDRAMINE HCL 50 MG/ML IJ SOLN
INTRAMUSCULAR | Status: AC
  Filled 2020-12-14: qty 1

## 2020-12-14 MED ORDER — PALONOSETRON HCL INJECTION 0.25 MG/5ML
0.2500 mg | Freq: Once | INTRAVENOUS | Status: AC
Start: 2020-12-14 — End: 2020-12-14
  Administered 2020-12-14: 0.25 mg via INTRAVENOUS

## 2020-12-14 MED ORDER — PALONOSETRON HCL INJECTION 0.25 MG/5ML
INTRAVENOUS | Status: AC
  Filled 2020-12-14: qty 5

## 2020-12-14 MED ORDER — SODIUM CHLORIDE 0.9% FLUSH
10.0000 mL | INTRAVENOUS | Status: DC | PRN
  Administered 2020-12-14: 10 mL
  Filled 2020-12-14: qty 10

## 2020-12-14 MED ORDER — HEPARIN SOD (PORK) LOCK FLUSH 100 UNIT/ML IV SOLN
500.0000 [IU] | Freq: Once | INTRAVENOUS | Status: AC | PRN
  Administered 2020-12-14: 500 [IU]
  Filled 2020-12-14: qty 5

## 2020-12-14 MED ORDER — FUROSEMIDE 20 MG PO TABS: 20.0000 mg | ORAL_TABLET | Freq: Two times a day (BID) | ORAL | 0 refills | Status: DC

## 2020-12-14 MED FILL — FUROSEMIDE 20 MG TABS: 20 | 15 days supply | Qty: 30 | Fill #0

## 2020-12-14 NOTE — Patient Instructions (Signed)
[facility] Discharge Instructions for Patients Receiving Chemotherapy  Today you received the following chemotherapy agents: paclitaxel.  To help prevent nausea and vomiting after your treatment, we encourage you to take your nausea medication as directed.   If you develop nausea and vomiting that is not controlled by your nausea medication, call the clinic.   BELOW ARE SYMPTOMS THAT SHOULD BE REPORTED IMMEDIATELY:  *FEVER GREATER THAN 100.5 F  *CHILLS WITH OR WITHOUT FEVER  NAUSEA AND VOMITING THAT IS NOT CONTROLLED WITH YOUR NAUSEA MEDICATION  *UNUSUAL SHORTNESS OF BREATH  *UNUSUAL BRUISING OR BLEEDING  TENDERNESS IN MOUTH AND THROAT WITH OR WITHOUT PRESENCE OF ULCERS  *URINARY PROBLEMS  *BOWEL PROBLEMS  UNUSUAL RASH Items with * indicate a potential emergency and should be followed up as soon as possible.  Feel free to call the clinic should you have any questions or concerns. The clinic phone number is [phone].  Please show the CHEMO ALERT CARD at check-in to the Emergency Department and triage nurse.   

## 2020-12-15 ENCOUNTER — Telehealth: Payer: Self-pay | Admitting: Hematology and Oncology

## 2020-12-15 NOTE — Telephone Encounter (Signed)
Scheduled per 3/29 los. Pt will receive an updated appt calendar per next visit appt notes  

## 2020-12-20 NOTE — Progress Notes (Signed)
Patient Care Team: Patient, [name] as PCP - General (General Practice) Rockwell Germany, RN as Oncology Nurse Navigator Mauro Kaufmann, RN as Oncology Nurse Navigator [name], MD as Consulting Physician (General Surgery) Nicholas Lose, MD as Consulting Physician (Hematology and Oncology) Gery Pray, MD as Consulting Physician (Radiation Oncology)  DIAGNOSIS:    ICD-10-CM   1. Malignant neoplasm of upper-outer quadrant of right breast in female, estrogen receptor positive ([place])  C50.411    Z17.0     SUMMARY OF ONCOLOGIC HISTORY: Oncology History  Malignant neoplasm of upper-outer quadrant of right breast in female, estrogen receptor positive ([place])  07/28/2020 Initial Diagnosis   Patient palpated a right breast mass for 1-2 years. Mammogram showed a 2.2cm mass at the 11 o'clock position with surrounding calcifications, 6.4cm in total extent, and up to 5 abnormal right axillary lymph nodes. 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    08/05/2020 Miscellaneous   MammaPrint: High risk luminal type B   08/11/2020 Genetic Testing   Negative genetic testing: no pathogenic variants detected in [facility] Breast Cancer STAT Panel or Common Hereditary Cancers panel. The report dates are August 11, 2020 and August 19, 2020, respectively. Two variants of uncertain signficance were detected - one in the CTNNA1 gene called c.86del and the second in the MLH1 gene called c.808A>G.   The STAT Breast cancer panel offered by [facility] includes sequencing and rearrangement analysis for the following 9 genes:  ATM, BRCA1, BRCA2, CDH1, CHEK2, PALB2, PTEN, STK11 and TP53.  The Common Hereditary Cancers Panel offered by [facility] includes sequencing and/or deletion duplication testing of the following 48 genes: APC, ATM, AXIN2, BARD1, BMPR1A, BRCA1,  BRCA2, BRIP1, CDH1, CDK4, CDKN2A (p14ARF), CDKN2A (p16INK4a), CHEK2, CTNNA1, DICER1, EPCAM (Deletion/duplication testing only), GREM1 (promoter region deletion/duplication testing only), KIT, MEN1, MLH1, MSH2, MSH3, MSH6, MUTYH, NBN, NF1, NTHL1, PALB2, PDGFRA, PMS2, POLD1, POLE, PTEN, RAD50, RAD51C, RAD51D, RNF43, SDHB, SDHC, SDHD, SMAD4, SMARCA4. STK11, TP53, TSC1, TSC2, and VHL.  The following genes were evaluated for sequence changes only: SDHA and HOXB13 c.251G>A variant only.    09/01/2020 -  Chemotherapy    Patient is on Treatment Plan: BREAST DOSE DENSE AC Q14D / PACLITAXEL D1,8,15 Q21D (CARBO REMOVED BY DR. Lindi Adie)        CHIEF COMPLIANT: Cycle9Taxol  INTERVAL HISTORY: Kahlyn Shippey is a 45 y.o. with above-mentioned history of right breast cancercurrently on neoadjuvant chemotherapy withweekly Taxol after completing four cycles ofdose dense Adriamycin and Cytoxan.She presents to the clinic todayfortoxicity checkandcycle9.  Her sugars continue to run high.  We stopped all steroids and therefore her appetite is slightly lower.  Denies any nausea vomiting.  Denies any worsening neuropathy.  Leg swelling has improved dramatically with Lasix.  ALLERGIES:  is allergic to latex.  MEDICATIONS:  Current Outpatient Medications  Medication Sig Dispense Refill  . cyclobenzaprine (FLEXERIL) 5 MG tablet TAKE 1 TABLET BY MOUTH 3 TIMES DAILY AS NEEDED FOR MUSCLE SPASMS. 20 tablet 0  . furosemide (LASIX) 20 MG tablet TAKE 1 TABLET BY MOUTH 2 TIMES DAILY 30 tablet 0  . Investigational dexamethasone 4 MG tablet [facility] 16070 Blister Card 2 Take 2 tablets by mouth daily. Take in the morning on Days 2-4. 6 tablet 0  . Investigational olanzapine/placebo 10 MG capsule [facility] 16070 Blister Card 2 Take 1 capsule by mouth daily.  Take in the morning on Days 2-4. 3 capsule 0  . Investigational prochlorperazine maleate/placebo 10 MG capsule [facility] 16070 Blister Card 2 Take 1 capsule by mouth every 8 (eight)  hours. Take on Days 1-4. 11 capsule 0  . lidocaine-prilocaine (EMLA) cream Apply to affected area once 30 g 3  . loratadine (CLARITIN) 10 MG tablet Take 10 mg by mouth daily.    . ondansetron (ZOFRAN) 8 MG tablet TAKE 1 TABLET BY MOUTH 2 TIMES DAILY AS NEEDED *START ON THE THIRD DAY AFTER CHEMOTHERAPY 30 tablet 2  . pantoprazole (PROTONIX) 20 MG tablet TAKE 1 TABLET BY MOUTH DAILY 30 tablet 1  . venlafaxine XR (EFFEXOR-XR) 37.5 MG 24 hr capsule TAKE 1 CAPSULE BY MOUTH DAILY WITH BREAKFAST 30 capsule 6   No current facility-administered medications for this visit.    PHYSICAL EXAMINATION: ECOG PERFORMANCE STATUS: 1 - Symptomatic but completely ambulatory  Vitals:   12/21/20 0836  BP: (!) 152/69  Pulse: 74  Resp: 20  Temp: 98.1 F (36.7 C)  SpO2: 100%   Filed Weights   12/21/20 0836  Weight: 215 lb 9.6 oz (97.8 kg)    LABORATORY DATA:  I have reviewed the data as listed CMP Latest Ref Rng & Units 12/21/2020 12/14/2020 12/07/2020  Glucose 70 - 99 mg/dL 182(H) 174(H) 208(H)  BUN 6 - 20 mg/dL _0 Creatinine 0.44 - 1.00 mg/dL 0.72 0.66 0.74  Sodium 135 - 145 mmol/L 143 141 141  Potassium 3.5 - 5.1 mmol/L 4.1 3.6 4.0  Chloride 98 - 111 mmol/L 109 106 107  CO2 22 - 32 mmol/L 20(L) 23 22  Calcium 8.9 - 10.3 mg/dL 8.6(L) 8.3(L) 9.0  Total Protein 6.5 - 8.1 g/dL 7.0 6.5 6.6  Total Bilirubin 0.3 - 1.2 mg/dL 0.2(L) 0.5 0.3  Alkaline Phos 38 - 126 U/L 56 51 54  AST 15 - 41 U/L _1 ALT 0 - 44 U/L 36 29 26    Lab Results  Component Value Date   WBC 5.3 12/21/2020   HGB 11.5 (L) 12/21/2020   HCT 33.9 (L) 12/21/2020   MCV 96.3 12/21/2020   PLT 227 12/21/2020   NEUTROABS 3.4 12/21/2020    ASSESSMENT & PLAN:  Malignant neoplasm of upper-outer quadrant of right breast in female, estrogen receptor positive ([place]) 07/28/2020:Patient palpated a right breast mass for 1-2 years. Mammogram showed a 2.2cm mass at the 11 o'clock position with surrounding calcifications, 6.4cm in  total extent, and up to 5 abnormal right axillary lymph nodes. 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  Treatment plan:  1. Neoadjuvant chemotherapy (MammaPrint test High Risk): AC foll by Taxol  2. mastectomy versus breast conserving surgery with targeted node dissection  3. Adjuvant radiation therapy  4. Follow-up adjuvant antiestrogen therapy  [facility] 16070: Treatment of refractory nausea  -------------------------------------------------------------------------------------------------------------------------------  Current Treatment: completed 4 cycles of Adriamycin and Cytoxan, today is cycle9 Taxol  Chemo toxicities:  1.nausea/vomiting: she is enrolled in [facility] 16070 study. No current nausea issues  2. Alopecia 3. Constipation: Rec to use miralax if needed. 4. severe hot flashes: Slightly better on Effexor 5.Mild peripheral neuropathy: Monitoring closely.  Weight gain: Patient has been gaining significant amount of weight.   Bilateral lower extremity edema: Secondary to long distance travel to [place] and back.  Lasix has helped her significantly.  We will discontinue it at this time.  Monitoring closely for toxicities.  Breast MRI will be planned after her last treatment on 01/19/2021 RTC weekly for taxol and every other week for follow up with me    No orders of the defined types were placed in this encounter.  The patient has a good understanding of the overall plan. she agrees with it. she will call with any problems that may develop before the next visit here.  Total time spent: 30 mins including face to face time and time spent for planning, charting and coordination of care  Rulon Eisenmenger, MD, MPH 12/21/2020  I, Cloyde Reams Dorshimer, am acting as scribe for Dr. Nicholas Lose.  I have reviewed the above documentation for accuracy and  completeness, and I agree with the above.

## 2020-12-21 ENCOUNTER — Inpatient Hospital Stay: Payer: Medicaid Other

## 2020-12-21 ENCOUNTER — Other Ambulatory Visit: Payer: Self-pay

## 2020-12-21 ENCOUNTER — Inpatient Hospital Stay (HOSPITAL_BASED_OUTPATIENT_CLINIC_OR_DEPARTMENT_OTHER): Payer: Medicaid Other | Admitting: Hematology and Oncology

## 2020-12-21 ENCOUNTER — Encounter: Payer: Self-pay | Admitting: Licensed Clinical Social Worker

## 2020-12-21 ENCOUNTER — Inpatient Hospital Stay: Payer: Medicaid Other | Attending: Hematology and Oncology

## 2020-12-21 DIAGNOSIS — Z79899 Other long term (current) drug therapy: Secondary | ICD-10-CM | POA: Diagnosis not present

## 2020-12-21 DIAGNOSIS — Z7952 Long term (current) use of systemic steroids: Secondary | ICD-10-CM | POA: Insufficient documentation

## 2020-12-21 DIAGNOSIS — C50411 Malignant neoplasm of upper-outer quadrant of right female breast: Secondary | ICD-10-CM

## 2020-12-21 DIAGNOSIS — Z5111 Encounter for antineoplastic chemotherapy: Secondary | ICD-10-CM | POA: Diagnosis not present

## 2020-12-21 DIAGNOSIS — Z170 Estrogen receptor positive status [ER+]: Secondary | ICD-10-CM | POA: Insufficient documentation

## 2020-12-21 DIAGNOSIS — D500 Iron deficiency anemia secondary to blood loss (chronic): Secondary | ICD-10-CM

## 2020-12-21 DIAGNOSIS — Z452 Encounter for adjustment and management of vascular access device: Secondary | ICD-10-CM

## 2020-12-21 DIAGNOSIS — C773 Secondary and unspecified malignant neoplasm of axilla and upper limb lymph nodes: Secondary | ICD-10-CM | POA: Insufficient documentation

## 2020-12-21 LAB — CMP (CANCER CENTER ONLY)
ALT: 36 U/L (ref 0–44)
AST: 25 U/L (ref 15–41)
Albumin: 4 g/dL (ref 3.5–5.0)
Alkaline Phosphatase: 56 U/L (ref 38–126)
Anion gap: 14 (ref 5–15)
BUN: 13 mg/dL (ref 6–20)
CO2: 20 mmol/L — ABNORMAL LOW (ref 22–32)
Calcium: 8.6 mg/dL — ABNORMAL LOW (ref 8.9–10.3)
Chloride: 109 mmol/L (ref 98–111)
Creatinine: 0.72 mg/dL (ref 0.44–1.00)
GFR, Estimated: >60 mL/min (ref >60)
Glucose, Bld: 182 mg/dL — ABNORMAL HIGH (ref 70–99)
Potassium: 4.1 mmol/L (ref 3.5–5.1)
Sodium: 143 mmol/L (ref 135–145)
Total Bilirubin: 0.2 mg/dL — ABNORMAL LOW (ref 0.3–1.2)
Total Protein: 7 g/dL (ref 6.5–8.1)

## 2020-12-21 LAB — CBC WITH DIFFERENTIAL (CANCER CENTER ONLY)
Abs Immature Granulocytes: 0.02 10*3/uL (ref 0.00–0.07)
Basophils Absolute: 0 10*3/uL (ref 0.0–0.1)
Basophils Relative: 1 %
Eosinophils Absolute: 0.3 10*3/uL (ref 0.0–0.5)
Eosinophils Relative: 6 %
HCT: 33.9 % — ABNORMAL LOW (ref 36.0–46.0)
Hemoglobin: 11.5 g/dL — ABNORMAL LOW (ref 12.0–15.0)
Immature Granulocytes: 0 %
Lymphocytes Relative: 22 %
Lymphs Abs: 1.2 10*3/uL (ref 0.7–4.0)
MCH: 32.7 pg (ref 26.0–34.0)
MCHC: 33.9 g/dL (ref 30.0–36.0)
MCV: 96.3 fL (ref 80.0–100.0)
Monocytes Absolute: 0.4 10*3/uL (ref 0.1–1.0)
Monocytes Relative: 7 %
Neutro Abs: 3.4 10*3/uL (ref 1.7–7.7)
Neutrophils Relative %: 64 %
Platelet Count: 227 10*3/uL (ref 150–400)
RBC: 3.52 MIL/uL — ABNORMAL LOW (ref 3.87–5.11)
RDW: 13.7 % (ref 11.5–15.5)
WBC Count: 5.3 10*3/uL (ref 4.0–10.5)
nRBC: 0 % (ref 0.0–0.2)

## 2020-12-21 MED ORDER — SODIUM CHLORIDE 0.9% FLUSH
10.0000 mL | Freq: Once | INTRAVENOUS | Status: AC
Start: 2020-12-21 — End: 2020-12-21
  Administered 2020-12-21: 10 mL
  Filled 2020-12-21: qty 10

## 2020-12-21 MED ORDER — FAMOTIDINE IN NACL 20-0.9 MG/50ML-% IV SOLN
INTRAVENOUS | Status: AC
  Filled 2020-12-21: qty 50

## 2020-12-21 MED ORDER — HEPARIN SOD (PORK) LOCK FLUSH 100 UNIT/ML IV SOLN
500.0000 [IU] | Freq: Once | INTRAVENOUS | Status: AC | PRN
  Administered 2020-12-21: 500 [IU]
  Filled 2020-12-21: qty 5

## 2020-12-21 MED ORDER — SODIUM CHLORIDE 0.9 % IV SOLN
60.0000 mg/m2 | Freq: Once | INTRAVENOUS | Status: AC
  Administered 2020-12-21: 120 mg via INTRAVENOUS
  Filled 2020-12-21: qty 20

## 2020-12-21 MED ORDER — SODIUM CHLORIDE 0.9% FLUSH
10.0000 mL | INTRAVENOUS | Status: DC | PRN
  Administered 2020-12-21: 10 mL
  Filled 2020-12-21: qty 10

## 2020-12-21 MED ORDER — PALONOSETRON HCL INJECTION 0.25 MG/5ML
INTRAVENOUS | Status: AC
  Filled 2020-12-21: qty 5

## 2020-12-21 MED ORDER — PALONOSETRON HCL INJECTION 0.25 MG/5ML
0.2500 mg | Freq: Once | INTRAVENOUS | Status: AC
  Administered 2020-12-21: 0.25 mg via INTRAVENOUS

## 2020-12-21 MED ORDER — DIPHENHYDRAMINE HCL 50 MG/ML IJ SOLN
25.0000 mg | Freq: Once | INTRAMUSCULAR | Status: AC
  Administered 2020-12-21: 25 mg via INTRAVENOUS

## 2020-12-21 MED ORDER — FAMOTIDINE IN NACL 20-0.9 MG/50ML-% IV SOLN
20.0000 mg | Freq: Once | INTRAVENOUS | Status: AC
  Administered 2020-12-21: 20 mg via INTRAVENOUS

## 2020-12-21 MED ORDER — SODIUM CHLORIDE 0.9 % IV SOLN
150.0000 mg | Freq: Once | INTRAVENOUS | Status: AC
  Administered 2020-12-21: 150 mg via INTRAVENOUS
  Filled 2020-12-21: qty 150

## 2020-12-21 MED ORDER — SODIUM CHLORIDE 0.9 % IV SOLN
Freq: Once | INTRAVENOUS | Status: AC
  Filled 2020-12-21: qty 250

## 2020-12-21 MED ORDER — DIPHENHYDRAMINE HCL 50 MG/ML IJ SOLN
INTRAMUSCULAR | Status: AC
  Filled 2020-12-21: qty 1

## 2020-12-21 NOTE — Patient Instructions (Signed)
[facility] Discharge Instructions for Patients Receiving Chemotherapy  Today you received the following chemotherapy agents: paclitaxel.  To help prevent nausea and vomiting after your treatment, we encourage you to take your nausea medication as directed.   If you develop nausea and vomiting that is not controlled by your nausea medication, call the clinic.   BELOW ARE SYMPTOMS THAT SHOULD BE REPORTED IMMEDIATELY:  *FEVER GREATER THAN 100.5 F  *CHILLS WITH OR WITHOUT FEVER  NAUSEA AND VOMITING THAT IS NOT CONTROLLED WITH YOUR NAUSEA MEDICATION  *UNUSUAL SHORTNESS OF BREATH  *UNUSUAL BRUISING OR BLEEDING  TENDERNESS IN MOUTH AND THROAT WITH OR WITHOUT PRESENCE OF ULCERS  *URINARY PROBLEMS  *BOWEL PROBLEMS  UNUSUAL RASH Items with * indicate a potential emergency and should be followed up as soon as possible.  Feel free to call the clinic should you have any questions or concerns. The clinic phone number is [phone].  Please show the CHEMO ALERT CARD at check-in to the Emergency Department and triage nurse.   

## 2020-12-21 NOTE — Progress Notes (Signed)
[facility] CSW Progress Note  Holiday representative met with patient in infusion. Received verbal permission to share her contact information to receive a donated meal for Easter.  Also provided applications for [employer] and [employer] as well as general assistance resources as patient still has surgery and then radiation to complete once chemotherapy finishes, so she is still not back to full work ability.    Christeen Douglas , LCSW

## 2020-12-21 NOTE — Assessment & Plan Note (Signed)
07/28/2020:Patient palpated a right breast mass for 1-2 years. Mammogram showed a 2.2cm mass at the 11 o'clock position with surrounding calcifications, 6.4cm in total extent, and up to 5 abnormal right axillary lymph nodes. 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  Treatment plan:  1. Neoadjuvant chemotherapy (MammaPrint test High Risk): AC foll by Taxol  2. mastectomy versus breast conserving surgery with targeted node dissection  3. Adjuvant radiation therapy  4. Follow-up adjuvant antiestrogen therapy  [facility] 16070: Treatment of refractory nausea  -------------------------------------------------------------------------------------------------------------------------------  Current Treatment: completed 4 cycles of Adriamycin and Cytoxan, today is cycle9 Taxol  Chemo toxicities:  1.nausea/vomiting: she is enrolled in [facility] 16070 study. No current nausea issues  2. Alopecia 3. Constipation: Rec to use miralax if needed. 4. severe hot flashes: Slightly better on Effexor 5.Mild peripheral neuropathy: Monitoring closely.  Weight gain: Patient has been gaining significant amount of weight.   Bilateral lower extremity edema: Secondary to long distance travel to [place] and back.    Given Lasix  Monitoring closely for toxicities.  RTC weekly for taxol and every other week for follow up with me

## 2020-12-22 ENCOUNTER — Telehealth: Payer: Self-pay | Admitting: Hematology and Oncology

## 2020-12-22 NOTE — Telephone Encounter (Signed)
Scheduled per 4/5 los. Pt will receive an updated appt calendar per next visit appt notes

## 2020-12-27 ENCOUNTER — Encounter: Payer: Self-pay | Admitting: *Deleted

## 2020-12-27 NOTE — Progress Notes (Signed)
Patient Care Team: Patient, [name] as PCP - General (General Practice) Rockwell Germany, RN as Oncology Nurse Navigator Mauro Kaufmann, RN as Oncology Nurse Navigator [name], MD as Consulting Physician (General Surgery) Nicholas Lose, MD as Consulting Physician (Hematology and Oncology) Gery Pray, MD as Consulting Physician (Radiation Oncology)  DIAGNOSIS:    ICD-10-CM   1. Malignant neoplasm of upper-outer quadrant of right breast in female, estrogen receptor positive ([place])  C50.411    Z17.0     SUMMARY OF ONCOLOGIC HISTORY: Oncology History  Malignant neoplasm of upper-outer quadrant of right breast in female, estrogen receptor positive ([place])  07/28/2020 Initial Diagnosis   Patient palpated a right breast mass for 1-2 years. Mammogram showed a 2.2cm mass at the 11 o'clock position with surrounding calcifications, 6.4cm in total extent, and up to 5 abnormal right axillary lymph nodes. 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    08/05/2020 Miscellaneous   MammaPrint: High risk luminal type B   08/11/2020 Genetic Testing   Negative genetic testing: no pathogenic variants detected in [facility] Breast Cancer STAT Panel or Common Hereditary Cancers panel. The report dates are August 11, 2020 and August 19, 2020, respectively. Two variants of uncertain signficance were detected - one in the CTNNA1 gene called c.86del and the second in the MLH1 gene called c.808A>G.   The STAT Breast cancer panel offered by [facility] includes sequencing and rearrangement analysis for the following 9 genes:  ATM, BRCA1, BRCA2, CDH1, CHEK2, PALB2, PTEN, STK11 and TP53.  The Common Hereditary Cancers Panel offered by [facility] includes sequencing and/or deletion duplication testing of the following 48 genes: APC, ATM, AXIN2, BARD1, BMPR1A, BRCA1,  BRCA2, BRIP1, CDH1, CDK4, CDKN2A (p14ARF), CDKN2A (p16INK4a), CHEK2, CTNNA1, DICER1, EPCAM (Deletion/duplication testing only), GREM1 (promoter region deletion/duplication testing only), KIT, MEN1, MLH1, MSH2, MSH3, MSH6, MUTYH, NBN, NF1, NTHL1, PALB2, PDGFRA, PMS2, POLD1, POLE, PTEN, RAD50, RAD51C, RAD51D, RNF43, SDHB, SDHC, SDHD, SMAD4, SMARCA4. STK11, TP53, TSC1, TSC2, and VHL.  The following genes were evaluated for sequence changes only: SDHA and HOXB13 c.251G>A variant only.    09/01/2020 -  Chemotherapy    Patient is on Treatment Plan: BREAST DOSE DENSE AC Q14D / PACLITAXEL D1,8,15 Q21D (CARBO REMOVED BY DR. Lindi Adie)        CHIEF COMPLIANT: Cycle10Taxol  INTERVAL HISTORY: Melody Rodriguez is a 45 y.o. with above-mentioned history of right breast cancercurrently on neoadjuvant chemotherapy withweekly Taxol after completing four cycles ofdose dense Adriamycin and Cytoxan.She presents to the clinic todayfortoxicity checkandcycle10.   ALLERGIES:  is allergic to cephalexin and latex.  MEDICATIONS:  Current Outpatient Medications  Medication Sig Dispense Refill  . cyclobenzaprine (FLEXERIL) 5 MG tablet TAKE 1 TABLET BY MOUTH 3 TIMES DAILY AS NEEDED FOR MUSCLE SPASMS. 20 tablet 0  . Investigational dexamethasone 4 MG tablet [facility] 16070 Blister Card 2 Take 2 tablets by mouth daily. Take in the morning on Days 2-4. 6 tablet 0  . Investigational olanzapine/placebo 10 MG capsule [facility] 16070 Blister Card 2 Take 1 capsule by mouth daily. Take in the morning on Days 2-4. 3 capsule 0  . Investigational prochlorperazine maleate/placebo 10 MG capsule [facility] 16070 Blister Card 2 Take 1 capsule by mouth every 8 (eight) hours. Take on Days 1-4. 11 capsule 0  . lidocaine-prilocaine (EMLA) cream Apply to affected area once 30 g 3  .  loratadine (CLARITIN) 10 MG tablet Take 10 mg by mouth daily.    . ondansetron (ZOFRAN) 8 MG tablet TAKE 1 TABLET BY MOUTH 2 TIMES DAILY AS NEEDED *START ON THE THIRD DAY  AFTER CHEMOTHERAPY 30 tablet 2  . pantoprazole (PROTONIX) 20 MG tablet TAKE 1 TABLET BY MOUTH DAILY 30 tablet 1  . venlafaxine XR (EFFEXOR-XR) 37.5 MG 24 hr capsule TAKE 1 CAPSULE BY MOUTH DAILY WITH BREAKFAST 30 capsule 6   No current facility-administered medications for this visit.    PHYSICAL EXAMINATION: ECOG PERFORMANCE STATUS: 1 - Symptomatic but completely ambulatory  Vitals:   12/28/20 0850  BP: 137/73  Pulse: 79  Resp: 18  Temp: 97.9 F (36.6 C)  SpO2: 98%   Filed Weights   12/28/20 0850  Weight: 213 lb 4.8 oz (96.8 kg)     LABORATORY DATA:  I have reviewed the data as listed CMP Latest Ref Rng & Units 12/21/2020 12/14/2020 12/07/2020  Glucose 70 - 99 mg/dL 182(H) 174(H) 208(H)  BUN 6 - 20 mg/dL _0 Creatinine 0.44 - 1.00 mg/dL 0.72 0.66 0.74  Sodium 135 - 145 mmol/L 143 141 141  Potassium 3.5 - 5.1 mmol/L 4.1 3.6 4.0  Chloride 98 - 111 mmol/L 109 106 107  CO2 22 - 32 mmol/L 20(L) 23 22  Calcium 8.9 - 10.3 mg/dL 8.6(L) 8.3(L) 9.0  Total Protein 6.5 - 8.1 g/dL 7.0 6.5 6.6  Total Bilirubin 0.3 - 1.2 mg/dL 0.2(L) 0.5 0.3  Alkaline Phos 38 - 126 U/L 56 51 54  AST 15 - 41 U/L _1 ALT 0 - 44 U/L 36 29 26    Lab Results  Component Value Date   WBC 5.6 12/28/2020   HGB 12.0 12/28/2020   HCT 35.6 (L) 12/28/2020   MCV 94.9 12/28/2020   PLT 240 12/28/2020   NEUTROABS 3.6 12/28/2020    ASSESSMENT & PLAN:  Malignant neoplasm of upper-outer quadrant of right breast in female, estrogen receptor positive ([place]) 07/28/2020:Patient palpated a right breast mass for 1-2 years. Mammogram showed a 2.2cm mass at the 11 o'clock position with surrounding calcifications, 6.4cm in total extent, and up to 5 abnormal right axillary lymph nodes. 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  Treatment plan:  1. Neoadjuvant chemotherapy  (MammaPrint test High Risk): AC foll by Taxol  2. mastectomy versus breast conserving surgery with targeted node dissection  3. Adjuvant radiation therapy  4. Follow-up adjuvant antiestrogen therapy  [facility] 16070: Treatment of refractory nausea  -------------------------------------------------------------------------------------------------------------------------------  Current Treatment: completed 4 cycles of Adriamycin and Cytoxan, today is cycle10 Taxol  Chemo toxicities:  1.nausea/vomiting: she is enrolled in [facility] 16070 study. Nocurrentnausea issues  2. Alopecia 3. Constipation: Rec to use miralax if needed. 4. severe hot flashes: Slightly better on Effexor 5.Mild peripheral neuropathy: Monitoring closely.    Bilateral lower extremity edema: resolved  Monitoring closely for toxicities.  Breast MRI will be planned after her last treatment on 01/19/2021    No orders of the defined types were placed in this encounter.  The patient has a good understanding of the overall plan. she agrees with it. she will call with any problems that may develop before the next visit here.  Total time spent: 30 mins including face to face time and time spent for planning, charting and coordination of care  Rulon Eisenmenger, MD, MPH 12/28/2020  I, Molly Dorshimer, am acting as scribe for Dr. Nicholas Lose.  I have reviewed the above documentation for accuracy and completeness, and I agree with the above.

## 2020-12-28 ENCOUNTER — Inpatient Hospital Stay: Payer: Medicaid Other

## 2020-12-28 ENCOUNTER — Other Ambulatory Visit: Payer: Self-pay

## 2020-12-28 ENCOUNTER — Inpatient Hospital Stay (HOSPITAL_BASED_OUTPATIENT_CLINIC_OR_DEPARTMENT_OTHER): Payer: Medicaid Other | Admitting: Hematology and Oncology

## 2020-12-28 DIAGNOSIS — C50411 Malignant neoplasm of upper-outer quadrant of right female breast: Secondary | ICD-10-CM

## 2020-12-28 DIAGNOSIS — Z170 Estrogen receptor positive status [ER+]: Secondary | ICD-10-CM | POA: Diagnosis not present

## 2020-12-28 DIAGNOSIS — Z5111 Encounter for antineoplastic chemotherapy: Secondary | ICD-10-CM | POA: Diagnosis not present

## 2020-12-28 DIAGNOSIS — D500 Iron deficiency anemia secondary to blood loss (chronic): Secondary | ICD-10-CM

## 2020-12-28 DIAGNOSIS — Z452 Encounter for adjustment and management of vascular access device: Secondary | ICD-10-CM

## 2020-12-28 LAB — CBC WITH DIFFERENTIAL (CANCER CENTER ONLY)
Abs Immature Granulocytes: 0.01 10*3/uL (ref 0.00–0.07)
Basophils Absolute: 0.1 10*3/uL (ref 0.0–0.1)
Basophils Relative: 1 %
Eosinophils Absolute: 0.3 10*3/uL (ref 0.0–0.5)
Eosinophils Relative: 6 %
HCT: 35.6 % — ABNORMAL LOW (ref 36.0–46.0)
Hemoglobin: 12 g/dL (ref 12.0–15.0)
Immature Granulocytes: 0 %
Lymphocytes Relative: 22 %
Lymphs Abs: 1.2 10*3/uL (ref 0.7–4.0)
MCH: 32 pg (ref 26.0–34.0)
MCHC: 33.7 g/dL (ref 30.0–36.0)
MCV: 94.9 fL (ref 80.0–100.0)
Monocytes Absolute: 0.4 10*3/uL (ref 0.1–1.0)
Monocytes Relative: 7 %
Neutro Abs: 3.6 10*3/uL (ref 1.7–7.7)
Neutrophils Relative %: 64 %
Platelet Count: 240 10*3/uL (ref 150–400)
RBC: 3.75 MIL/uL — ABNORMAL LOW (ref 3.87–5.11)
RDW: 12.9 % (ref 11.5–15.5)
WBC Count: 5.6 10*3/uL (ref 4.0–10.5)
nRBC: 0 % (ref 0.0–0.2)

## 2020-12-28 LAB — CMP (CANCER CENTER ONLY)
ALT: 22 U/L (ref 0–44)
AST: 19 U/L (ref 15–41)
Albumin: 4.1 g/dL (ref 3.5–5.0)
Alkaline Phosphatase: 59 U/L (ref 38–126)
Anion gap: 14 (ref 5–15)
BUN: 12 mg/dL (ref 6–20)
CO2: 21 mmol/L — ABNORMAL LOW (ref 22–32)
Calcium: 9 mg/dL (ref 8.9–10.3)
Chloride: 107 mmol/L (ref 98–111)
Creatinine: 0.67 mg/dL (ref 0.44–1.00)
GFR, Estimated: >60 mL/min (ref >60)
Glucose, Bld: 173 mg/dL — ABNORMAL HIGH (ref 70–99)
Potassium: 3.9 mmol/L (ref 3.5–5.1)
Sodium: 142 mmol/L (ref 135–145)
Total Bilirubin: 0.3 mg/dL (ref 0.3–1.2)
Total Protein: 7 g/dL (ref 6.5–8.1)

## 2020-12-28 MED ORDER — DIPHENHYDRAMINE HCL 50 MG/ML IJ SOLN
25.0000 mg | Freq: Once | INTRAMUSCULAR | Status: AC
  Administered 2020-12-28: 25 mg via INTRAVENOUS

## 2020-12-28 MED ORDER — FAMOTIDINE IN NACL 20-0.9 MG/50ML-% IV SOLN
INTRAVENOUS | Status: AC
  Filled 2020-12-28: qty 50

## 2020-12-28 MED ORDER — SODIUM CHLORIDE 0.9% FLUSH
10.0000 mL | INTRAVENOUS | Status: DC | PRN
  Administered 2020-12-28: 10 mL
  Filled 2020-12-28: qty 10

## 2020-12-28 MED ORDER — HEPARIN SOD (PORK) LOCK FLUSH 100 UNIT/ML IV SOLN
500.0000 [IU] | Freq: Once | INTRAVENOUS | Status: AC | PRN
  Administered 2020-12-28: 500 [IU]
  Filled 2020-12-28: qty 5

## 2020-12-28 MED ORDER — SODIUM CHLORIDE 0.9 % IV SOLN
Freq: Once | INTRAVENOUS | Status: AC
  Filled 2020-12-28: qty 250

## 2020-12-28 MED ORDER — FOSAPREPITANT DIMEGLUMINE INJECTION 150 MG
150.0000 mg | Freq: Once | INTRAVENOUS | Status: AC
  Administered 2020-12-28: 150 mg via INTRAVENOUS
  Filled 2020-12-28: qty 150

## 2020-12-28 MED ORDER — SODIUM CHLORIDE 0.9 % IV SOLN
60.0000 mg/m2 | Freq: Once | INTRAVENOUS | Status: AC
  Administered 2020-12-28: 120 mg via INTRAVENOUS
  Filled 2020-12-28: qty 20

## 2020-12-28 MED ORDER — SODIUM CHLORIDE 0.9% FLUSH
10.0000 mL | Freq: Once | INTRAVENOUS | Status: AC
  Administered 2020-12-28: 10 mL
  Filled 2020-12-28: qty 10

## 2020-12-28 MED ORDER — DIPHENHYDRAMINE HCL 50 MG/ML IJ SOLN
INTRAMUSCULAR | Status: AC
  Filled 2020-12-28: qty 1

## 2020-12-28 MED ORDER — PALONOSETRON HCL INJECTION 0.25 MG/5ML
INTRAVENOUS | Status: AC
  Filled 2020-12-28: qty 5

## 2020-12-28 MED ORDER — FAMOTIDINE IN NACL 20-0.9 MG/50ML-% IV SOLN
20.0000 mg | Freq: Once | INTRAVENOUS | Status: AC
  Administered 2020-12-28: 20 mg via INTRAVENOUS

## 2020-12-28 MED ORDER — PALONOSETRON HCL INJECTION 0.25 MG/5ML
0.2500 mg | Freq: Once | INTRAVENOUS | Status: AC
  Administered 2020-12-28: 0.25 mg via INTRAVENOUS

## 2020-12-28 NOTE — Assessment & Plan Note (Signed)
07/28/2020:Patient palpated a right breast mass for 1-2 years. Mammogram showed a 2.2cm mass at the 11 o'clock position with surrounding calcifications, 6.4cm in total extent, and up to 5 abnormal right axillary lymph nodes. 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  Treatment plan:  1. Neoadjuvant chemotherapy (MammaPrint test High Risk): AC foll by Taxol  2. mastectomy versus breast conserving surgery with targeted node dissection  3. Adjuvant radiation therapy  4. Follow-up adjuvant antiestrogen therapy  [facility] 16070: Treatment of refractory nausea  -------------------------------------------------------------------------------------------------------------------------------  Current Treatment: completed 4 cycles of Adriamycin and Cytoxan, today is cycle10 Taxol  Chemo toxicities:  1.nausea/vomiting: she is enrolled in [facility] 16070 study. Nocurrentnausea issues  2. Alopecia 3. Constipation: Rec to use miralax if needed. 4. severe hot flashes: Slightly better on Effexor 5.Mild peripheral neuropathy: Monitoring closely.  Weight gain: Patient has been gaining significant amount of weight.   Bilateral lower extremity edema: Secondary to long distance travel to [place] and back.  Lasix has helped her significantly.  We will discontinue it at this time.  Monitoring closely for toxicities.  Breast MRI will be planned after her last treatment on 01/19/2021

## 2020-12-28 NOTE — Patient Instructions (Signed)
[facility] Discharge Instructions for Patients Receiving Chemotherapy  Today you received the following chemotherapy agents: paclitaxel.  To help prevent nausea and vomiting after your treatment, we encourage you to take your nausea medication as directed.   If you develop nausea and vomiting that is not controlled by your nausea medication, call the clinic.   BELOW ARE SYMPTOMS THAT SHOULD BE REPORTED IMMEDIATELY:  *FEVER GREATER THAN 100.5 F  *CHILLS WITH OR WITHOUT FEVER  NAUSEA AND VOMITING THAT IS NOT CONTROLLED WITH YOUR NAUSEA MEDICATION  *UNUSUAL SHORTNESS OF BREATH  *UNUSUAL BRUISING OR BLEEDING  TENDERNESS IN MOUTH AND THROAT WITH OR WITHOUT PRESENCE OF ULCERS  *URINARY PROBLEMS  *BOWEL PROBLEMS  UNUSUAL RASH Items with * indicate a potential emergency and should be followed up as soon as possible.  Feel free to call the clinic should you have any questions or concerns. The clinic phone number is [phone].  Please show the CHEMO ALERT CARD at check-in to the Emergency Department and triage nurse.   

## 2021-01-04 ENCOUNTER — Other Ambulatory Visit: Payer: Self-pay

## 2021-01-04 ENCOUNTER — Inpatient Hospital Stay: Payer: Medicaid Other

## 2021-01-04 ENCOUNTER — Encounter: Payer: Self-pay | Admitting: *Deleted

## 2021-01-04 VITALS — BP 144/66 | HR 78 | Temp 97.9°F | Resp 18

## 2021-01-04 DIAGNOSIS — Z5111 Encounter for antineoplastic chemotherapy: Secondary | ICD-10-CM | POA: Diagnosis not present

## 2021-01-04 DIAGNOSIS — Z170 Estrogen receptor positive status [ER+]: Secondary | ICD-10-CM

## 2021-01-04 DIAGNOSIS — C50411 Malignant neoplasm of upper-outer quadrant of right female breast: Secondary | ICD-10-CM

## 2021-01-04 LAB — CMP (CANCER CENTER ONLY)
ALT: 18 U/L (ref 0–44)
AST: 15 U/L (ref 15–41)
Albumin: 3.9 g/dL (ref 3.5–5.0)
Alkaline Phosphatase: 52 U/L (ref 38–126)
Anion gap: 10 (ref 5–15)
BUN: 11 mg/dL (ref 6–20)
CO2: 24 mmol/L (ref 22–32)
Calcium: 8.7 mg/dL — ABNORMAL LOW (ref 8.9–10.3)
Chloride: 108 mmol/L (ref 98–111)
Creatinine: 0.68 mg/dL (ref 0.44–1.00)
GFR, Estimated: >60 mL/min (ref >60)
Glucose, Bld: 180 mg/dL — ABNORMAL HIGH (ref 70–99)
Potassium: 3.8 mmol/L (ref 3.5–5.1)
Sodium: 142 mmol/L (ref 135–145)
Total Bilirubin: <0.2 mg/dL — ABNORMAL LOW (ref 0.3–1.2)
Total Protein: 6.6 g/dL (ref 6.5–8.1)

## 2021-01-04 LAB — CBC WITH DIFFERENTIAL (CANCER CENTER ONLY)
Abs Immature Granulocytes: 0.02 10*3/uL (ref 0.00–0.07)
Basophils Absolute: 0 10*3/uL (ref 0.0–0.1)
Basophils Relative: 1 %
Eosinophils Absolute: 0.3 10*3/uL (ref 0.0–0.5)
Eosinophils Relative: 6 %
HCT: 34 % — ABNORMAL LOW (ref 36.0–46.0)
Hemoglobin: 11.6 g/dL — ABNORMAL LOW (ref 12.0–15.0)
Immature Granulocytes: 0 %
Lymphocytes Relative: 21 %
Lymphs Abs: 1.1 10*3/uL (ref 0.7–4.0)
MCH: 32.8 pg (ref 26.0–34.0)
MCHC: 34.1 g/dL (ref 30.0–36.0)
MCV: 96 fL (ref 80.0–100.0)
Monocytes Absolute: 0.4 10*3/uL (ref 0.1–1.0)
Monocytes Relative: 8 %
Neutro Abs: 3.5 10*3/uL (ref 1.7–7.7)
Neutrophils Relative %: 64 %
Platelet Count: 214 10*3/uL (ref 150–400)
RBC: 3.54 MIL/uL — ABNORMAL LOW (ref 3.87–5.11)
RDW: 12.4 % (ref 11.5–15.5)
WBC Count: 5.4 10*3/uL (ref 4.0–10.5)
nRBC: 0 % (ref 0.0–0.2)

## 2021-01-04 MED ORDER — SODIUM CHLORIDE 0.9 % IV SOLN
Freq: Once | INTRAVENOUS | Status: AC
  Filled 2021-01-04: qty 250

## 2021-01-04 MED ORDER — PALONOSETRON HCL INJECTION 0.25 MG/5ML
0.2500 mg | Freq: Once | INTRAVENOUS | Status: AC
  Administered 2021-01-04: 0.25 mg via INTRAVENOUS

## 2021-01-04 MED ORDER — FAMOTIDINE IN NACL 20-0.9 MG/50ML-% IV SOLN
INTRAVENOUS | Status: AC
  Filled 2021-01-04: qty 50

## 2021-01-04 MED ORDER — FAMOTIDINE IN NACL 20-0.9 MG/50ML-% IV SOLN
20.0000 mg | Freq: Once | INTRAVENOUS | Status: AC
Start: 2021-01-04 — End: 2021-01-04
  Administered 2021-01-04: 20 mg via INTRAVENOUS

## 2021-01-04 MED ORDER — DIPHENHYDRAMINE HCL 50 MG/ML IJ SOLN
INTRAMUSCULAR | Status: AC
  Filled 2021-01-04: qty 1

## 2021-01-04 MED ORDER — DIPHENHYDRAMINE HCL 50 MG/ML IJ SOLN
25.0000 mg | Freq: Once | INTRAMUSCULAR | Status: AC
Start: 2021-01-04 — End: 2021-01-04
  Administered 2021-01-04: 25 mg via INTRAVENOUS

## 2021-01-04 MED ORDER — SODIUM CHLORIDE 0.9% FLUSH
10.0000 mL | INTRAVENOUS | Status: DC | PRN
  Administered 2021-01-04: 10 mL
  Filled 2021-01-04: qty 10

## 2021-01-04 MED ORDER — HEPARIN SOD (PORK) LOCK FLUSH 100 UNIT/ML IV SOLN
500.0000 [IU] | Freq: Once | INTRAVENOUS | Status: AC | PRN
Start: 2021-01-04 — End: 2021-01-04
  Administered 2021-01-04: 500 [IU]
  Filled 2021-01-04: qty 5

## 2021-01-04 MED ORDER — SODIUM CHLORIDE 0.9 % IV SOLN
60.0000 mg/m2 | Freq: Once | INTRAVENOUS | Status: AC
  Administered 2021-01-04: 120 mg via INTRAVENOUS
  Filled 2021-01-04: qty 20

## 2021-01-04 MED ORDER — SODIUM CHLORIDE 0.9 % IV SOLN
150.0000 mg | Freq: Once | INTRAVENOUS | Status: AC
  Administered 2021-01-04: 150 mg via INTRAVENOUS
  Filled 2021-01-04: qty 150

## 2021-01-04 MED ORDER — PALONOSETRON HCL INJECTION 0.25 MG/5ML
INTRAVENOUS | Status: AC
  Filled 2021-01-04: qty 5

## 2021-01-04 NOTE — Patient Instructions (Signed)
[facility] Discharge Instructions for Patients Receiving Chemotherapy  Today you received the following chemotherapy agents taxol  To help prevent nausea and vomiting after your treatment, we encourage you to take your nausea medication as directed   If you develop nausea and vomiting that is not controlled by your nausea medication, call the clinic.   BELOW ARE SYMPTOMS THAT SHOULD BE REPORTED IMMEDIATELY:  *FEVER GREATER THAN 100.5 F  *CHILLS WITH OR WITHOUT FEVER  NAUSEA AND VOMITING THAT IS NOT CONTROLLED WITH YOUR NAUSEA MEDICATION  *UNUSUAL SHORTNESS OF BREATH  *UNUSUAL BRUISING OR BLEEDING  TENDERNESS IN MOUTH AND THROAT WITH OR WITHOUT PRESENCE OF ULCERS  *URINARY PROBLEMS  *BOWEL PROBLEMS  UNUSUAL RASH Items with * indicate a potential emergency and should be followed up as soon as possible.  Feel free to call the clinic should you have any questions or concerns. The clinic phone number is [phone].  Please show the CHEMO ALERT CARD at check-in to the Emergency Department and triage nurse.   

## 2021-01-10 NOTE — Progress Notes (Signed)
Patient Care Team: Patient, [name] as PCP - General (General Practice) Rockwell Germany, RN as Oncology Nurse Navigator Mauro Kaufmann, RN as Oncology Nurse Navigator [name], MD as Consulting Physician (General Surgery) Nicholas Lose, MD as Consulting Physician (Hematology and Oncology) Gery Pray, MD as Consulting Physician (Radiation Oncology)  DIAGNOSIS:    ICD-10-CM   1. Malignant neoplasm of upper-outer quadrant of right breast in female, estrogen receptor positive ([place])  C50.411    Z17.0     SUMMARY OF ONCOLOGIC HISTORY: Oncology History  Malignant neoplasm of upper-outer quadrant of right breast in female, estrogen receptor positive ([place])  07/28/2020 Initial Diagnosis   Patient palpated a right breast mass for 1-2 years. Mammogram showed a 2.2cm mass at the 11 o'clock position with surrounding calcifications, 6.4cm in total extent, and up to 5 abnormal right axillary lymph nodes. 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    08/05/2020 Miscellaneous   MammaPrint: High risk luminal type B   08/11/2020 Genetic Testing   Negative genetic testing: no pathogenic variants detected in [facility] Breast Cancer STAT Panel or Common Hereditary Cancers panel. The report dates are August 11, 2020 and August 19, 2020, respectively. Two variants of uncertain signficance were detected - one in the CTNNA1 gene called c.86del and the second in the MLH1 gene called c.808A>G.   The STAT Breast cancer panel offered by [facility] includes sequencing and rearrangement analysis for the following 9 genes:  ATM, BRCA1, BRCA2, CDH1, CHEK2, PALB2, PTEN, STK11 and TP53.  The Common Hereditary Cancers Panel offered by [facility] includes sequencing and/or deletion duplication testing of the following 48 genes: APC, ATM, AXIN2, BARD1, BMPR1A, BRCA1,  BRCA2, BRIP1, CDH1, CDK4, CDKN2A (p14ARF), CDKN2A (p16INK4a), CHEK2, CTNNA1, DICER1, EPCAM (Deletion/duplication testing only), GREM1 (promoter region deletion/duplication testing only), KIT, MEN1, MLH1, MSH2, MSH3, MSH6, MUTYH, NBN, NF1, NTHL1, PALB2, PDGFRA, PMS2, POLD1, POLE, PTEN, RAD50, RAD51C, RAD51D, RNF43, SDHB, SDHC, SDHD, SMAD4, SMARCA4. STK11, TP53, TSC1, TSC2, and VHL.  The following genes were evaluated for sequence changes only: SDHA and HOXB13 c.251G>A variant only.    09/01/2020 -  Chemotherapy    Patient is on Treatment Plan: BREAST DOSE DENSE AC Q14D / PACLITAXEL D1,8,15 Q21D (CARBO REMOVED BY DR. Lindi Adie)        CHIEF COMPLIANT: Cycle12Taxol  INTERVAL HISTORY: Gilbert Narain is a 45 y.o. with above-mentioned history of right breast cancercurrently on neoadjuvant chemotherapy withweekly Taxol after completing four cycles ofdose dense Adriamycin and Cytoxan.She presents to the clinic todayfortoxicity checkandcycle12.  ALLERGIES:  is allergic to cephalexin and latex.  MEDICATIONS:  Current Outpatient Medications  Medication Sig Dispense Refill  . cyclobenzaprine (FLEXERIL) 5 MG tablet TAKE 1 TABLET BY MOUTH 3 TIMES DAILY AS NEEDED FOR MUSCLE SPASMS. 20 tablet 0  . Investigational dexamethasone 4 MG tablet [facility] 16070 Blister Card 2 Take 2 tablets by mouth daily. Take in the morning on Days 2-4. 6 tablet 0  . Investigational olanzapine/placebo 10 MG capsule [facility] 16070 Blister Card 2 Take 1 capsule by mouth daily. Take in the morning on Days 2-4. 3 capsule 0  . Investigational prochlorperazine maleate/placebo 10 MG capsule [facility] 16070 Blister Card 2 Take 1 capsule by mouth every 8 (eight) hours. Take on Days 1-4. 11 capsule 0  . lidocaine-prilocaine (EMLA) cream Apply to affected area once 30 g 3  .  loratadine (CLARITIN) 10 MG tablet Take 10 mg by mouth daily.    . ondansetron (ZOFRAN) 8 MG tablet TAKE 1 TABLET BY MOUTH 2 TIMES DAILY AS NEEDED *START ON THE THIRD DAY  AFTER CHEMOTHERAPY 30 tablet 2  . pantoprazole (PROTONIX) 20 MG tablet TAKE 1 TABLET BY MOUTH DAILY 30 tablet 1  . venlafaxine XR (EFFEXOR-XR) 37.5 MG 24 hr capsule TAKE 1 CAPSULE BY MOUTH DAILY WITH BREAKFAST 30 capsule 6   No current facility-administered medications for this visit.    PHYSICAL EXAMINATION: ECOG PERFORMANCE STATUS: 1 - Symptomatic but completely ambulatory  Vitals:   01/11/21 0827  BP: 125/84  Pulse: 80  Resp: 18  Temp: (!) 97.5 F (36.4 C)  SpO2: 100%   Filed Weights   01/11/21 0827  Weight: 216 lb (98 kg)    LABORATORY DATA:  I have reviewed the data as listed CMP Latest Ref Rng & Units 01/04/2021 12/28/2020 12/21/2020  Glucose 70 - 99 mg/dL 180(H) 173(H) 182(H)  BUN 6 - 20 mg/dL _0 Creatinine 0.44 - 1.00 mg/dL 0.68 0.67 0.72  Sodium 135 - 145 mmol/L 142 142 143  Potassium 3.5 - 5.1 mmol/L 3.8 3.9 4.1  Chloride 98 - 111 mmol/L 108 107 109  CO2 22 - 32 mmol/L 24 21(L) 20(L)  Calcium 8.9 - 10.3 mg/dL 8.7(L) 9.0 8.6(L)  Total Protein 6.5 - 8.1 g/dL 6.6 7.0 7.0  Total Bilirubin 0.3 - 1.2 mg/dL <0.2(L) 0.3 0.2(L)  Alkaline Phos 38 - 126 U/L 52 59 56  AST 15 - 41 U/L _1 ALT 0 - 44 U/L 18 22 36    Lab Results  Component Value Date   WBC 5.4 01/11/2021   HGB 12.6 01/11/2021   HCT 36.8 01/11/2021   MCV 96.1 01/11/2021   PLT 239 01/11/2021   NEUTROABS 3.5 01/11/2021    ASSESSMENT & PLAN:  Malignant neoplasm of upper-outer quadrant of right breast in female, estrogen receptor positive ([place]) 07/28/2020:Patient palpated a right breast mass for 1-2 years. Mammogram showed a 2.2cm mass at the 11 o'clock position with surrounding calcifications, 6.4cm in total extent, and up to 5 abnormal right axillary lymph nodes. 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  Treatment plan:  1. Neoadjuvant chemotherapy  (MammaPrint test High Risk): AC foll by Taxol  2. mastectomy versus breast conserving surgery with targeted node dissection  3. Adjuvant radiation therapy  4. Follow-up adjuvant antiestrogen therapy  [facility] 16070: Treatment of refractory nausea  -------------------------------------------------------------------------------------------------------------------------------  Current Treatment: completed 4 cycles of Adriamycin and Cytoxan, today is cycle12Taxol  Chemo toxicities:  1.nausea/vomiting: she is enrolled in [facility] 16070 study. Nocurrentnausea issues  2. Alopecia 3. Constipation: Rec to use miralax if needed. 4. severe hot flashes: Slightly better on Effexor 5.Mild peripheral neuropathy: Monitoring closely.    Bilateral lower extremity edema: resolved  Dysfunctional uterine bleeding: I will refer her to gynecology  Monitoring closely for toxicities. Breast MRI will be planned after her last treatment on 01/19/2021  Return to clinic after surgery   No orders of the defined types were placed in this encounter.  The patient has a good understanding of the overall plan. she agrees with it. she will call with any problems that may develop before the next visit here.  Total time spent: 30 mins including face to face time  and time spent for planning, charting and coordination of care  Rulon Eisenmenger, MD, MPH 01/11/2021  I, Molly Dorshimer, am acting as scribe for Dr. Nicholas Lose.  I have reviewed the above documentation for accuracy and completeness, and I agree with the above.

## 2021-01-11 ENCOUNTER — Inpatient Hospital Stay: Payer: Medicaid Other

## 2021-01-11 ENCOUNTER — Other Ambulatory Visit: Payer: Self-pay

## 2021-01-11 ENCOUNTER — Encounter: Payer: Self-pay | Admitting: *Deleted

## 2021-01-11 ENCOUNTER — Inpatient Hospital Stay (HOSPITAL_BASED_OUTPATIENT_CLINIC_OR_DEPARTMENT_OTHER): Payer: Medicaid Other | Admitting: Hematology and Oncology

## 2021-01-11 ENCOUNTER — Encounter: Payer: Self-pay | Admitting: Licensed Clinical Social Worker

## 2021-01-11 ENCOUNTER — Other Ambulatory Visit: Payer: Self-pay | Admitting: *Deleted

## 2021-01-11 VITALS — BP 125/84 | HR 80 | Temp 97.5°F | Resp 18 | Ht 64.0 in | Wt 216.0 lb

## 2021-01-11 DIAGNOSIS — Z170 Estrogen receptor positive status [ER+]: Secondary | ICD-10-CM

## 2021-01-11 DIAGNOSIS — N938 Other specified abnormal uterine and vaginal bleeding: Secondary | ICD-10-CM

## 2021-01-11 DIAGNOSIS — C50411 Malignant neoplasm of upper-outer quadrant of right female breast: Secondary | ICD-10-CM

## 2021-01-11 DIAGNOSIS — D500 Iron deficiency anemia secondary to blood loss (chronic): Secondary | ICD-10-CM

## 2021-01-11 DIAGNOSIS — Z452 Encounter for adjustment and management of vascular access device: Secondary | ICD-10-CM

## 2021-01-11 DIAGNOSIS — Z5111 Encounter for antineoplastic chemotherapy: Secondary | ICD-10-CM | POA: Diagnosis not present

## 2021-01-11 LAB — CBC WITH DIFFERENTIAL (CANCER CENTER ONLY)
Abs Immature Granulocytes: 0.01 10*3/uL (ref 0.00–0.07)
Basophils Absolute: 0 10*3/uL (ref 0.0–0.1)
Basophils Relative: 1 %
Eosinophils Absolute: 0.3 10*3/uL (ref 0.0–0.5)
Eosinophils Relative: 5 %
HCT: 36.8 % (ref 36.0–46.0)
Hemoglobin: 12.6 g/dL (ref 12.0–15.0)
Immature Granulocytes: 0 %
Lymphocytes Relative: 21 %
Lymphs Abs: 1.1 10*3/uL (ref 0.7–4.0)
MCH: 32.9 pg (ref 26.0–34.0)
MCHC: 34.2 g/dL (ref 30.0–36.0)
MCV: 96.1 fL (ref 80.0–100.0)
Monocytes Absolute: 0.4 10*3/uL (ref 0.1–1.0)
Monocytes Relative: 8 %
Neutro Abs: 3.5 10*3/uL (ref 1.7–7.7)
Neutrophils Relative %: 65 %
Platelet Count: 239 10*3/uL (ref 150–400)
RBC: 3.83 MIL/uL — ABNORMAL LOW (ref 3.87–5.11)
RDW: 12.2 % (ref 11.5–15.5)
WBC Count: 5.4 10*3/uL (ref 4.0–10.5)
nRBC: 0 % (ref 0.0–0.2)

## 2021-01-11 LAB — CMP (CANCER CENTER ONLY)
ALT: 29 U/L (ref 0–44)
AST: 22 U/L (ref 15–41)
Albumin: 4.2 g/dL (ref 3.5–5.0)
Alkaline Phosphatase: 56 U/L (ref 38–126)
Anion gap: 11 (ref 5–15)
BUN: 12 mg/dL (ref 6–20)
CO2: 21 mmol/L — ABNORMAL LOW (ref 22–32)
Calcium: 9 mg/dL (ref 8.9–10.3)
Chloride: 108 mmol/L (ref 98–111)
Creatinine: 0.69 mg/dL (ref 0.44–1.00)
GFR, Estimated: >60 mL/min (ref >60)
Glucose, Bld: 211 mg/dL — ABNORMAL HIGH (ref 70–99)
Potassium: 4 mmol/L (ref 3.5–5.1)
Sodium: 140 mmol/L (ref 135–145)
Total Bilirubin: 0.2 mg/dL — ABNORMAL LOW (ref 0.3–1.2)
Total Protein: 7.3 g/dL (ref 6.5–8.1)

## 2021-01-11 MED ORDER — FAMOTIDINE IN NACL 20-0.9 MG/50ML-% IV SOLN
20.0000 mg | Freq: Once | INTRAVENOUS | Status: AC
  Administered 2021-01-11: 20 mg via INTRAVENOUS

## 2021-01-11 MED ORDER — PALONOSETRON HCL INJECTION 0.25 MG/5ML
0.2500 mg | Freq: Once | INTRAVENOUS | Status: AC
  Administered 2021-01-11: 0.25 mg via INTRAVENOUS

## 2021-01-11 MED ORDER — SODIUM CHLORIDE 0.9% FLUSH
10.0000 mL | INTRAVENOUS | Status: DC | PRN
  Administered 2021-01-11: 10 mL
  Filled 2021-01-11: qty 10

## 2021-01-11 MED ORDER — SODIUM CHLORIDE 0.9 % IV SOLN
60.0000 mg/m2 | Freq: Once | INTRAVENOUS | Status: AC
  Administered 2021-01-11: 120 mg via INTRAVENOUS
  Filled 2021-01-11: qty 20

## 2021-01-11 MED ORDER — PALONOSETRON HCL INJECTION 0.25 MG/5ML
INTRAVENOUS | Status: AC
  Filled 2021-01-11: qty 5

## 2021-01-11 MED ORDER — SODIUM CHLORIDE 0.9 % IV SOLN
150.0000 mg | Freq: Once | INTRAVENOUS | Status: AC
  Administered 2021-01-11: 150 mg via INTRAVENOUS
  Filled 2021-01-11: qty 150

## 2021-01-11 MED ORDER — DIPHENHYDRAMINE HCL 50 MG/ML IJ SOLN
INTRAMUSCULAR | Status: AC
  Filled 2021-01-11: qty 1

## 2021-01-11 MED ORDER — HEPARIN SOD (PORK) LOCK FLUSH 100 UNIT/ML IV SOLN
500.0000 [IU] | Freq: Once | INTRAVENOUS | Status: AC | PRN
  Administered 2021-01-11: 500 [IU]
  Filled 2021-01-11: qty 5

## 2021-01-11 MED ORDER — DIPHENHYDRAMINE HCL 50 MG/ML IJ SOLN
25.0000 mg | Freq: Once | INTRAMUSCULAR | Status: AC
  Administered 2021-01-11: 25 mg via INTRAVENOUS

## 2021-01-11 MED ORDER — SODIUM CHLORIDE 0.9% FLUSH
10.0000 mL | Freq: Once | INTRAVENOUS | Status: AC
  Administered 2021-01-11: 10 mL
  Filled 2021-01-11: qty 10

## 2021-01-11 MED ORDER — SODIUM CHLORIDE 0.9 % IV SOLN
Freq: Once | INTRAVENOUS | Status: AC
  Filled 2021-01-11: qty 250

## 2021-01-11 MED ORDER — FAMOTIDINE IN NACL 20-0.9 MG/50ML-% IV SOLN
INTRAVENOUS | Status: AC
  Filled 2021-01-11: qty 50

## 2021-01-11 NOTE — Patient Instructions (Signed)
Implanted Port Insertion, Care After This sheet gives you information about how to care for yourself after your procedure. Your health care provider may also give you more specific instructions. If you have problems or questions, contact your health care provider. What can I expect after the procedure? After the procedure, it is common to have:  Discomfort at the port insertion site.  Bruising on the skin over the port. This should improve over 3-4 days. Follow these instructions at home: Port care  After your port is placed, you will get a manufacturer's information card. The card has information about your port. Keep this card with you at all times.  Take care of the port as told by your health care provider. Ask your health care provider if you or a family member can get training for taking care of the port at home. A home health care nurse may also take care of the port.  Make sure to remember what type of port you have. Incision care  Follow instructions from your health care provider about how to take care of your port insertion site. Make sure you: ? Wash your hands with soap and water before and after you change your bandage (dressing). If soap and water are not available, use hand sanitizer. ? Change your dressing as told by your health care provider. ? Leave stitches (sutures), skin glue, or adhesive strips in place. These skin closures may need to stay in place for 2 weeks or longer. If adhesive strip edges start to loosen and curl up, you may trim the loose edges. Do not remove adhesive strips completely unless your health care provider tells you to do that.  Check your port insertion site every day for signs of infection. Check for: ? Redness, swelling, or pain. ? Fluid or blood. ? Warmth. ? Pus or a bad smell.      Activity  Return to your normal activities as told by your health care provider. Ask your health care provider what activities are safe for you.  Do not  lift anything that is heavier than 10 lb (4.5 kg), or the limit that you are told, until your health care provider says that it is safe. General instructions  Take over-the-counter and prescription medicines only as told by your health care provider.  Do not take baths, swim, or use a hot tub until your health care provider approves. Ask your health care provider if you may take showers. You may only be allowed to take sponge baths.  Do not drive for 24 hours if you were given a sedative during your procedure.  Wear a medical alert bracelet in case of an emergency. This will tell any health care providers that you have a port.  Keep all follow-up visits as told by your health care provider. This is important. Contact a health care provider if:  You cannot flush your port with saline as directed, or you cannot draw blood from the port.  You have a fever or chills.  You have redness, swelling, or pain around your port insertion site.  You have fluid or blood coming from your port insertion site.  Your port insertion site feels warm to the touch.  You have pus or a bad smell coming from the port insertion site. Get help right away if:  You have chest pain or shortness of breath.  You have bleeding from your port that you cannot control. Summary  Take care of the port as told by your   health care provider. Keep the manufacturer's information card with you at all times.  Change your dressing as told by your health care provider.  Contact a health care provider if you have a fever or chills or if you have redness, swelling, or pain around your port insertion site.  Keep all follow-up visits as told by your health care provider. This information is not intended to replace advice given to you by your health care provider. Make sure you discuss any questions you have with your health care provider. Document Revised: 04/02/2018 Document Reviewed: 04/02/2018 Elsevier Patient Education   2021 [employer]  

## 2021-01-11 NOTE — Progress Notes (Signed)
Received fax from [facility] stating they do not accept Medicaid pts.  RN contacted [facility] for [employer] at [facility] who stated they do accept Medicaid pts.  Referral successfully faxed to [phone].

## 2021-01-11 NOTE — Progress Notes (Signed)
Per MD request, RN successfully faxed referral to [facility].

## 2021-01-11 NOTE — Assessment & Plan Note (Signed)
07/28/2020:Patient palpated a right breast mass for 1-2 years. Mammogram showed a 2.2cm mass at the 11 o'clock position with surrounding calcifications, 6.4cm in total extent, and up to 5 abnormal right axillary lymph nodes. 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  Treatment plan:  1. Neoadjuvant chemotherapy (MammaPrint test High Risk): AC foll by Taxol  2. mastectomy versus breast conserving surgery with targeted node dissection  3. Adjuvant radiation therapy  4. Follow-up adjuvant antiestrogen therapy  [facility] 16070: Treatment of refractory nausea  -------------------------------------------------------------------------------------------------------------------------------  Current Treatment: completed 4 cycles of Adriamycin and Cytoxan, today is cycle12Taxol  Chemo toxicities:  1.nausea/vomiting: she is enrolled in [facility] study. Nocurrentnausea issues  2. Alopecia 3. Constipation: Rec to use miralax if needed. 4. severe hot flashes: Slightly better on Effexor 5.Mild peripheral neuropathy: Monitoring closely.    Bilateral lower extremity edema: resolved  Monitoring closely for toxicities. Breast MRI will be planned after her last treatment on 01/19/2021

## 2021-01-11 NOTE — Patient Instructions (Signed)
[facility]   Discharge Instructions: Thank you for choosing [facility] to provide your oncology and hematology care.   If you have a lab appointment with the Cancer Center, please go directly to the Cancer Center and check in at the registration area.   Wear comfortable clothing and clothing appropriate for easy access to any Portacath or PICC line.   We strive to give you quality time with your provider. You may need to reschedule your appointment if you arrive late (15 or more minutes).  Arriving late affects you and other patients whose appointments are after yours.  Also, if you miss three or more appointments without notifying the office, you may be dismissed from the clinic at the provider's discretion.      For prescription refill requests, have your pharmacy contact our office and allow 72 hours for refills to be completed.    Today you received the following chemotherapy and/or immunotherapy agents: paclitaxel.      To help prevent nausea and vomiting after your treatment, we encourage you to take your nausea medication as directed.  BELOW ARE SYMPTOMS THAT SHOULD BE REPORTED IMMEDIATELY: *FEVER GREATER THAN 100.4 F (38 C) OR HIGHER *CHILLS OR SWEATING *NAUSEA AND VOMITING THAT IS NOT CONTROLLED WITH YOUR NAUSEA MEDICATION *UNUSUAL SHORTNESS OF BREATH *UNUSUAL BRUISING OR BLEEDING *URINARY PROBLEMS (pain or burning when urinating, or frequent urination) *BOWEL PROBLEMS (unusual diarrhea, constipation, pain near the anus) TENDERNESS IN MOUTH AND THROAT WITH OR WITHOUT PRESENCE OF ULCERS (sore throat, sores in mouth, or a toothache) UNUSUAL RASH, SWELLING OR PAIN  UNUSUAL VAGINAL DISCHARGE OR ITCHING   Items with * indicate a potential emergency and should be followed up as soon as possible or go to the Emergency Department if any problems should occur.  Please show the CHEMOTHERAPY ALERT CARD or IMMUNOTHERAPY ALERT CARD at check-in  to the Emergency Department and triage nurse.  Should you have questions after your visit or need to cancel or reschedule your appointment, please contact [facility]  Dept: [phone]  and follow the prompts.  Office hours are 8:00 a.m. to 4:30 p.m. Monday - Friday. Please note that voicemails left after 4:00 p.m. may not be returned until the following business day.  We are closed weekends and major holidays. You have access to a nurse at all times for urgent questions. Please call the main number to the clinic Dept: [phone] and follow the prompts.   For any non-urgent questions, you may also contact your provider using MyChart. We now offer e-Visits for anyone 18 and older to request care online for non-urgent symptoms. For details visit [email or web address].   Also download the MyChart app! Go to the app store, search "MyChart", open the app, select [facility], and log in with your MyChart username and password.  Due to Covid, a mask is required upon entering the hospital/clinic. If you do not have a mask, one will be given to you upon arrival. For doctor visits, patients may have 1 support person aged 18 or older with them. For treatment visits, patients cannot have anyone with them due to current Covid guidelines and our immunocompromised population.   

## 2021-01-11 NOTE — Progress Notes (Signed)
[place] CSW Progress Note  Clinical Social Worker attempted to check-in on patient in infusion for supportive counsel.  Patient sleeping. CSW will be available as needed at future treatments.   Christeen Douglas , LCSW

## 2021-01-12 ENCOUNTER — Ambulatory Visit
Admission: RE | Admit: 2021-01-12 | Discharge: 2021-01-12 | Disposition: A | Discharge status: Home / Self Care | Payer: Medicaid Other | Source: Ambulatory Visit | Attending: Hematology and Oncology | Admitting: Hematology and Oncology

## 2021-01-12 DIAGNOSIS — Z170 Estrogen receptor positive status [ER+]: Secondary | ICD-10-CM

## 2021-01-12 DIAGNOSIS — C50411 Malignant neoplasm of upper-outer quadrant of right female breast: Secondary | ICD-10-CM

## 2021-01-12 MED ORDER — GADOBUTROL 1 MMOL/ML IV SOLN
9.0000 mL | Freq: Once | INTRAVENOUS | Status: AC | PRN
  Administered 2021-01-12: 9 mL via INTRAVENOUS

## 2021-01-13 ENCOUNTER — Encounter: Payer: Self-pay | Admitting: *Deleted

## 2021-01-14 ENCOUNTER — Other Ambulatory Visit: Payer: Self-pay | Admitting: Surgery

## 2021-01-14 DIAGNOSIS — Z853 Personal history of malignant neoplasm of breast: Secondary | ICD-10-CM

## 2021-01-17 ENCOUNTER — Other Ambulatory Visit: Payer: Self-pay | Admitting: Surgery

## 2021-01-17 DIAGNOSIS — Z853 Personal history of malignant neoplasm of breast: Secondary | ICD-10-CM

## 2021-01-18 ENCOUNTER — Encounter: Payer: Self-pay | Admitting: Licensed Clinical Social Worker

## 2021-01-18 ENCOUNTER — Ambulatory Visit: Payer: PRIVATE HEALTH INSURANCE | Admitting: Hematology and Oncology

## 2021-01-18 ENCOUNTER — Ambulatory Visit: Payer: PRIVATE HEALTH INSURANCE

## 2021-01-18 ENCOUNTER — Encounter: Payer: Self-pay | Admitting: *Deleted

## 2021-01-18 ENCOUNTER — Telehealth: Payer: Self-pay | Admitting: Hematology and Oncology

## 2021-01-18 ENCOUNTER — Other Ambulatory Visit: Payer: PRIVATE HEALTH INSURANCE

## 2021-01-18 DIAGNOSIS — Z170 Estrogen receptor positive status [ER+]: Secondary | ICD-10-CM

## 2021-01-18 DIAGNOSIS — C50411 Malignant neoplasm of upper-outer quadrant of right female breast: Secondary | ICD-10-CM

## 2021-01-18 NOTE — Progress Notes (Signed)
[place] CSW Progress Note  Clinical Education officer, museum received message from patient asking how to contact [employer] re: question about assistance. CSW provided contact information.  Pt also stated that she needs to move as inspection failed due to landlord not completing what needed to be fixed. CSW provided information on [employer] and [employer] to assist in search for new housing.   Christeen Douglas , LCSW

## 2021-01-18 NOTE — Telephone Encounter (Signed)
Scheduled appt per 5/3 sch msg. Pt aware.  

## 2021-01-20 ENCOUNTER — Encounter: Payer: Self-pay | Admitting: *Deleted

## 2021-01-31 ENCOUNTER — Other Ambulatory Visit: Payer: Self-pay

## 2021-01-31 ENCOUNTER — Encounter (HOSPITAL_BASED_OUTPATIENT_CLINIC_OR_DEPARTMENT_OTHER): Payer: Self-pay | Admitting: Surgery

## 2021-02-03 ENCOUNTER — Other Ambulatory Visit (HOSPITAL_COMMUNITY): Payer: PRIVATE HEALTH INSURANCE

## 2021-02-03 ENCOUNTER — Other Ambulatory Visit: Payer: Self-pay

## 2021-02-03 ENCOUNTER — Ambulatory Visit (INDEPENDENT_AMBULATORY_CARE_PROVIDER_SITE_OTHER): Payer: Medicaid Other | Admitting: Obstetrics and Gynecology

## 2021-02-03 ENCOUNTER — Encounter (HOSPITAL_BASED_OUTPATIENT_CLINIC_OR_DEPARTMENT_OTHER)
Admission: RE | Admit: 2021-02-03 | Discharge: 2021-02-03 | Disposition: A | Discharge status: Home / Self Care | Payer: Medicaid Other | Source: Ambulatory Visit | Attending: Surgery | Admitting: Surgery

## 2021-02-03 ENCOUNTER — Encounter: Payer: Self-pay | Admitting: Obstetrics and Gynecology

## 2021-02-03 VITALS — BP 124/86 | HR 96 | Ht 64.0 in | Wt 213.1 lb

## 2021-02-03 DIAGNOSIS — C50411 Malignant neoplasm of upper-outer quadrant of right female breast: Secondary | ICD-10-CM | POA: Diagnosis not present

## 2021-02-03 DIAGNOSIS — Z170 Estrogen receptor positive status [ER+]: Secondary | ICD-10-CM

## 2021-02-03 LAB — BASIC METABOLIC PANEL
Anion gap: 12 (ref 5–15)
BUN: 12 mg/dL (ref 6–20)
CO2: 22 mmol/L (ref 22–32)
Calcium: 9.7 mg/dL (ref 8.9–10.3)
Chloride: 101 mmol/L (ref 98–111)
Creatinine, Ser: 0.56 mg/dL (ref 0.44–1.00)
GFR, Estimated: >60 mL/min (ref >60)
Glucose, Bld: 197 mg/dL — ABNORMAL HIGH (ref 70–99)
Potassium: 4.2 mmol/L (ref 3.5–5.1)
Sodium: 135 mmol/L (ref 135–145)

## 2021-02-03 MED ORDER — ENSURE PRE-SURGERY PO LIQD: 296.0000 mL | Freq: Once | ORAL | Status: DC

## 2021-02-03 NOTE — Progress Notes (Signed)
New Patient is in the office for  Pt reports had pap smear in Oct 2021 at [facility] Pt was recently recieving chemo after lump discovered in R breast. Pt states that she finished chemo in April. Pt reports that last cycle was when she started chemo in Dec 2021.Previously cycles have had painful cramps and excessive bleeding, changing pad every 20 minutes.  Pt would like to discuss hysterectomy today.

## 2021-02-03 NOTE — Progress Notes (Signed)

## 2021-02-04 ENCOUNTER — Ambulatory Visit
Admission: RE | Admit: 2021-02-04 | Discharge: 2021-02-04 | Disposition: A | Discharge status: Home / Self Care | Payer: Medicaid Other | Source: Ambulatory Visit | Attending: Surgery | Admitting: Surgery

## 2021-02-04 ENCOUNTER — Other Ambulatory Visit: Payer: Self-pay

## 2021-02-04 ENCOUNTER — Other Ambulatory Visit: Payer: Self-pay | Admitting: Surgery

## 2021-02-04 DIAGNOSIS — Z853 Personal history of malignant neoplasm of breast: Secondary | ICD-10-CM

## 2021-02-06 NOTE — H&P (Signed)
Melody Rodriguez  Location: [facility] Patient #: [id number] DOB: 12/26/1975 Undefined / Language: Cleophus Molt / Race: Black or African American Female   History of Present Illness  The patient is a 45 year old female who presents with breast cancer. Chief complaint: Right breast mass with right breast cancer  This is a 45 year old female who is otherwise healthy who palpated a mass in her breast almost a year ago on the right side. Since then, she is also had some bilateral nipple discharge. She underwent mammograms and ultrasound showing a 2.5 cm mass at the 11 o'clock position of the right breast. There were also surrounding calcifications with a total area measuring 6.4 cm. She also had a large lymph node. The mass and lymph node were biopsied. This showed an invasive ductal carcinoma as well as in situ disease. ER was 50% weak, PR was 20% and strong, HER-2 was negative, and the Ki-67 was 20%. She is otherwise without complaints. There is a family history of colon cancer in her sister but no breast cancer.  she started neoadjuvant chemotherapy after her diagnosis. She has been doing very well. She just had a follow-up MRI showing that the lymph nodes in the axilla resolved and the tumor is now 2.5 cm in size. All the previous mass like enhancement has resolved. She's been doing very well and is tolerating her therapy.    Past Surgical History  Breast Biopsy  Right. Cesarean Section - Multiple  Thyroid Surgery   Diagnostic Studies History Colonoscopy  never Mammogram  >3 years ago Pap Smear  1-5 years ago  Medication History Conni Slipper, RN;  Medications Reconciled  Social History Conni Slipper, RN;  Alcohol use  Occasional alcohol use. Caffeine use  Coffee. Illicit drug use  Uses socially only. Tobacco use  Current every day smoker.  Family History Conni Slipper, RN;  Anesthetic complications  Mother. Bleeding disorder  Mother. Breast Cancer   Family Members In General. Colon Cancer  Sister. Diabetes Mellitus  Family Members In General. Heart disease in female family member before age 22  Hypertension  Sister. Migraine Headache  Mother. Seizure disorder  Mother.  Pregnancy / Birth History Age at menarche  25 years. Gravida  2 Irregular periods  Length (months) of breastfeeding  7-12 Maternal age  7-25 Para  2  Other Problems Back Pain  Breast Cancer  Gastroesophageal Reflux Disease  Hemorrhoids  Lump In Breast  Migraine Headache  Thyroid Disease     Review of Systems General Present- Chills, Fatigue and Night Sweats. Not Present- Appetite Loss, Fever, Weight Gain and Weight Loss. Skin Present- Change in Wart/Mole, Dryness, Hives, New Lesions and Rash. Not Present- Jaundice, Non-Healing Wounds and Ulcer. HEENT Present- Earache, Hoarseness, Ringing in the Ears, Seasonal Allergies, Sinus Pain, Visual Disturbances and Wears glasses/contact lenses. Not Present- Hearing Loss, Nose Bleed, Oral Ulcers, Sore Throat and Yellow Eyes. Respiratory Present- Chronic Cough, Difficulty Breathing, Snoring and Wheezing. Not Present- Bloody sputum. Breast Present- Breast Mass, Breast Pain and Skin Changes. Not Present- Nipple Discharge. Cardiovascular Present- Chest Pain, Difficulty Breathing Lying Down, Leg Cramps, Rapid Heart Rate, Shortness of Breath and Swelling of Extremities. Not Present- Palpitations. Gastrointestinal Present- Abdominal Pain, Bloating, Chronic diarrhea, Excessive gas, Gets full quickly at meals, Hemorrhoids and Rectal Pain. Not Present- Bloody Stool, Change in Bowel Habits, Constipation, Difficulty Swallowing, Indigestion, Nausea and Vomiting. Female Genitourinary Present- Frequency, Nocturia, Pelvic Pain and Urgency. Not Present- Painful Urination. Musculoskeletal Present- Back Pain, Joint Pain, Joint Stiffness,  Muscle Pain, Muscle Weakness and Swelling of Extremities. Neurological Present-  Headaches, Tingling and Weakness. Not Present- Decreased Memory, Fainting, Numbness, Seizures, Tremor and Trouble walking. Psychiatric Not Present- Anxiety, Bipolar, Change in Sleep Pattern, Depression, Fearful and Frequent crying. Endocrine Present- Hair Changes, Heat Intolerance and Hot flashes. Not Present- Cold Intolerance, Excessive Hunger and New Diabetes. Hematology Not Present- Blood Thinners, Easy Bruising, Excessive bleeding, Gland problems, HIV and Persistent Infections.   Physical Exam The physical exam findings are as follows: Note: On examination, she appears well  There is a vague mass which is over 2-1/2 cm in size at the 11 o'clock position near the areola of the right breast. There is slight shotty adenopathy bilaterally. There are no other palpable masses. There is no skin dimpling.    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 I have reviewed her mammogram, ultrasound, and pathology results. We have also discussed her at length in our multidisciplinary breast conference this morning. She has a large right breast invasive ductal carcinoma. She also has DCIS. Including the area of calcifications this area measures 6.4 cm in size. She also had one positive lymph node. At this point a long discussion with her regarding treatment of breast cancer. We discussed both breast conservation and mastectomy. Given the size of this area, her age, and her family history, as well as the bloody nipple discharge from the left breast, she needs bilateral breast MRI to define the extent of disease. She may not be a candidate for breast conservation. She would need neoadjuvant treatment if she insist on breast conservation but this may change depending on the stent of disease on the MRI. With mastectomies, we discussed also referring her to  plastic surgery for immediate reconstruction. She will also be meeting with genetics for counseling regarding this as well. She will also be meeting with the medical and radiation oncologist.  Addendum: She has had significant improvement with neoadjuvant therapy regarding the right breast cancer DCIS.  On examination of the right breast, I can no longer feel a mass in the breast. There is no axillary adenopathy. The nipple areolar complex is normal in appearance  Given the MRI, I believe she is a candidate to attempt a radioactive seed guided lumpectomy as well as a right axillary target lymph node dissection. I discussed this with her in detail. We discussed the risk which includes but is not limited to bleeding, infection, the need for further surgery if the margins are positive, injury to strike structures, cardiopulmonary issues, etc. We also plan on removing the Port-A-Cath same time as is no longer needed. She understands and is eager to surgery which will be scheduled.

## 2021-02-07 ENCOUNTER — Ambulatory Visit
Admission: RE | Admit: 2021-02-07 | Discharge: 2021-02-07 | Disposition: A | Discharge status: Home / Self Care | Payer: Medicaid Other | Source: Ambulatory Visit | Attending: Surgery | Admitting: Surgery

## 2021-02-07 ENCOUNTER — Ambulatory Visit (HOSPITAL_BASED_OUTPATIENT_CLINIC_OR_DEPARTMENT_OTHER): Payer: Medicaid Other | Admitting: Anesthesiology

## 2021-02-07 ENCOUNTER — Other Ambulatory Visit (HOSPITAL_COMMUNITY): Payer: Self-pay

## 2021-02-07 ENCOUNTER — Other Ambulatory Visit: Payer: Self-pay

## 2021-02-07 ENCOUNTER — Encounter (HOSPITAL_BASED_OUTPATIENT_CLINIC_OR_DEPARTMENT_OTHER): Payer: Self-pay | Admitting: Surgery

## 2021-02-07 ENCOUNTER — Ambulatory Visit (HOSPITAL_COMMUNITY)
Admission: RE | Admit: 2021-02-07 | Discharge: 2021-02-07 | Disposition: A | Discharge status: Home / Self Care | Payer: Medicaid Other | Source: Ambulatory Visit | Attending: Surgery | Admitting: Surgery

## 2021-02-07 ENCOUNTER — Other Ambulatory Visit: Payer: Self-pay | Admitting: Surgery

## 2021-02-07 ENCOUNTER — Encounter (HOSPITAL_BASED_OUTPATIENT_CLINIC_OR_DEPARTMENT_OTHER)
Admission: RE | Disposition: A | Discharge status: Home / Self Care | Payer: Self-pay | Source: Home / Self Care | Attending: Surgery

## 2021-02-07 ENCOUNTER — Ambulatory Visit (HOSPITAL_BASED_OUTPATIENT_CLINIC_OR_DEPARTMENT_OTHER)
Admission: RE | Admit: 2021-02-07 | Discharge: 2021-02-07 | Disposition: A | Discharge status: Home / Self Care | Payer: Medicaid Other | Attending: Surgery | Admitting: Surgery

## 2021-02-07 DIAGNOSIS — Z853 Personal history of malignant neoplasm of breast: Secondary | ICD-10-CM

## 2021-02-07 DIAGNOSIS — Z803 Family history of malignant neoplasm of breast: Secondary | ICD-10-CM | POA: Diagnosis not present

## 2021-02-07 DIAGNOSIS — C773 Secondary and unspecified malignant neoplasm of axilla and upper limb lymph nodes: Secondary | ICD-10-CM | POA: Diagnosis not present

## 2021-02-07 DIAGNOSIS — Z170 Estrogen receptor positive status [ER+]: Secondary | ICD-10-CM | POA: Diagnosis not present

## 2021-02-07 DIAGNOSIS — Z832 Family history of diseases of the blood and blood-forming organs and certain disorders involving the immune mechanism: Secondary | ICD-10-CM | POA: Diagnosis not present

## 2021-02-07 DIAGNOSIS — Z9221 Personal history of antineoplastic chemotherapy: Secondary | ICD-10-CM | POA: Diagnosis not present

## 2021-02-07 DIAGNOSIS — Z800 Family history of malignant neoplasm of digestive organs: Secondary | ICD-10-CM | POA: Diagnosis not present

## 2021-02-07 DIAGNOSIS — F17200 Nicotine dependence, unspecified, uncomplicated: Secondary | ICD-10-CM | POA: Insufficient documentation

## 2021-02-07 DIAGNOSIS — C50911 Malignant neoplasm of unspecified site of right female breast: Secondary | ICD-10-CM | POA: Diagnosis present

## 2021-02-07 DIAGNOSIS — C50411 Malignant neoplasm of upper-outer quadrant of right female breast: Secondary | ICD-10-CM | POA: Diagnosis not present

## 2021-02-07 DIAGNOSIS — Z833 Family history of diabetes mellitus: Secondary | ICD-10-CM | POA: Diagnosis not present

## 2021-02-07 DIAGNOSIS — Z820 Family history of epilepsy and other diseases of the nervous system: Secondary | ICD-10-CM | POA: Diagnosis not present

## 2021-02-07 DIAGNOSIS — K219 Gastro-esophageal reflux disease without esophagitis: Secondary | ICD-10-CM | POA: Insufficient documentation

## 2021-02-07 HISTORY — PX: BREAST LUMPECTOMY WITH RADIOACTIVE SEED AND SENTINEL LYMPH NODE BIOPSY: SHX6550

## 2021-02-07 HISTORY — PX: PORT-A-CATH REMOVAL: SHX5289

## 2021-02-07 LAB — POCT PREGNANCY, URINE: Preg Test, Ur: NEGATIVE

## 2021-02-07 SURGERY — BREAST LUMPECTOMY WITH RADIOACTIVE SEED AND SENTINEL LYMPH NODE BIOPSY
Anesthesia: General | Site: Chest | Laterality: Right

## 2021-02-07 MED ORDER — MIDAZOLAM HCL 2 MG/2ML IJ SOLN
2.0000 mg | Freq: Once | INTRAMUSCULAR | Status: AC
  Administered 2021-02-07: 2 mg via INTRAVENOUS

## 2021-02-07 MED ORDER — DEXAMETHASONE SODIUM PHOSPHATE 4 MG/ML IJ SOLN
INTRAMUSCULAR | Status: DC | PRN
  Administered 2021-02-07: 5 mg via INTRAVENOUS

## 2021-02-07 MED ORDER — ROPIVACAINE HCL 5 MG/ML IJ SOLN
INTRAMUSCULAR | Status: DC | PRN
  Administered 2021-02-07: 30 mL via PERINEURAL

## 2021-02-07 MED ORDER — MIDAZOLAM HCL 2 MG/2ML IJ SOLN
INTRAMUSCULAR | Status: AC
  Filled 2021-02-07: qty 2

## 2021-02-07 MED ORDER — LIDOCAINE 2% (20 MG/ML) 5 ML SYRINGE
INTRAMUSCULAR | Status: DC | PRN
  Administered 2021-02-07: 100 mg via INTRAVENOUS

## 2021-02-07 MED ORDER — FENTANYL CITRATE (PF) 100 MCG/2ML IJ SOLN
INTRAMUSCULAR | Status: AC
  Filled 2021-02-07: qty 2

## 2021-02-07 MED ORDER — CHLORHEXIDINE GLUCONATE CLOTH 2 % EX PADS: 6.0000 | MEDICATED_PAD | Freq: Once | CUTANEOUS | Status: DC

## 2021-02-07 MED ORDER — LACTATED RINGERS IV SOLN: INTRAVENOUS | Status: DC

## 2021-02-07 MED ORDER — CIPROFLOXACIN IN D5W 400 MG/200ML IV SOLN
INTRAVENOUS | Status: AC
  Filled 2021-02-07: qty 200

## 2021-02-07 MED ORDER — ACETAMINOPHEN 500 MG PO TABS
ORAL_TABLET | ORAL | Status: AC
  Filled 2021-02-07: qty 2

## 2021-02-07 MED ORDER — PROPOFOL 500 MG/50ML IV EMUL
INTRAVENOUS | Status: DC | PRN
  Administered 2021-02-07: 25 ug/kg/min via INTRAVENOUS

## 2021-02-07 MED ORDER — FENTANYL CITRATE (PF) 100 MCG/2ML IJ SOLN
25.0000 ug | INTRAMUSCULAR | Status: DC | PRN
  Administered 2021-02-07: 50 ug via INTRAVENOUS

## 2021-02-07 MED ORDER — CIPROFLOXACIN IN D5W 400 MG/200ML IV SOLN
400.0000 mg | INTRAVENOUS | Status: AC
  Administered 2021-02-07: 400 mg via INTRAVENOUS

## 2021-02-07 MED ORDER — TECHNETIUM TC 99M TILMANOCEPT KIT
1.0000 | PACK | Freq: Once | INTRAVENOUS | Status: AC | PRN
  Administered 2021-02-07: 1 via INTRADERMAL

## 2021-02-07 MED ORDER — BUPIVACAINE-EPINEPHRINE 0.5% -1:200000 IJ SOLN
INTRAMUSCULAR | Status: DC | PRN
  Administered 2021-02-07: 30 mL

## 2021-02-07 MED ORDER — ONDANSETRON HCL 4 MG/2ML IJ SOLN: 4.0000 mg | Freq: Once | INTRAMUSCULAR | Status: DC | PRN

## 2021-02-07 MED ORDER — ONDANSETRON HCL 4 MG/2ML IJ SOLN
INTRAMUSCULAR | Status: DC | PRN
  Administered 2021-02-07: 4 mg via INTRAVENOUS

## 2021-02-07 MED ORDER — PROPOFOL 10 MG/ML IV BOLUS
INTRAVENOUS | Status: DC | PRN
  Administered 2021-02-07: 150 mg via INTRAVENOUS

## 2021-02-07 MED ORDER — AMISULPRIDE (ANTIEMETIC) 5 MG/2ML IV SOLN
10.0000 mg | Freq: Once | INTRAVENOUS | Status: AC | PRN
  Administered 2021-02-07: 10 mg via INTRAVENOUS

## 2021-02-07 MED ORDER — OXYCODONE HCL 5 MG PO TABS
5.0000 mg | ORAL_TABLET | Freq: Once | ORAL | Status: AC | PRN
  Administered 2021-02-07: 5 mg via ORAL

## 2021-02-07 MED ORDER — FENTANYL CITRATE (PF) 100 MCG/2ML IJ SOLN
100.0000 ug | Freq: Once | INTRAMUSCULAR | Status: AC
  Administered 2021-02-07: 100 ug via INTRAVENOUS

## 2021-02-07 MED ORDER — FENTANYL CITRATE (PF) 100 MCG/2ML IJ SOLN
INTRAMUSCULAR | Status: DC | PRN
  Administered 2021-02-07 (×4): 25 ug via INTRAVENOUS

## 2021-02-07 MED ORDER — ACETAMINOPHEN 500 MG PO TABS
1000.0000 mg | ORAL_TABLET | ORAL | Status: AC
  Administered 2021-02-07: 1000 mg via ORAL

## 2021-02-07 MED ORDER — OXYCODONE HCL 5 MG/5ML PO SOLN: 5.0000 mg | Freq: Once | ORAL | Status: AC | PRN

## 2021-02-07 MED ORDER — AMISULPRIDE (ANTIEMETIC) 5 MG/2ML IV SOLN
INTRAVENOUS | Status: AC
  Filled 2021-02-07: qty 4

## 2021-02-07 MED ORDER — OXYCODONE HCL 5 MG PO TABS
ORAL_TABLET | ORAL | Status: AC
  Filled 2021-02-07: qty 1

## 2021-02-07 MED ORDER — OXYCODONE HCL 5 MG PO TABS
5.0000 mg | ORAL_TABLET | Freq: Four times a day (QID) | ORAL | 0 refills | Status: DC | PRN
  Filled 2021-02-07: qty 25, 7d supply, fill #0

## 2021-02-07 SURGICAL SUPPLY — 47 items
ADH SKN CLS APL DERMABOND .7 (GAUZE/BANDAGES/DRESSINGS) ×2
APL PRP STRL LF DISP 70% ISPRP (MISCELLANEOUS) ×2
APPLIER CLIP 9.375 MED OPEN (MISCELLANEOUS) ×3
APR CLP MED 9.3 20 MLT OPN (MISCELLANEOUS) ×2
BINDER BREAST 3XL (GAUZE/BANDAGES/DRESSINGS) IMPLANT
BINDER BREAST XLRG (GAUZE/BANDAGES/DRESSINGS) ×3 IMPLANT
BINDER BREAST XXLRG (GAUZE/BANDAGES/DRESSINGS) IMPLANT
BLADE SURG 15 STRL LF DISP TIS (BLADE) ×2 IMPLANT
BLADE SURG 15 STRL SS (BLADE) ×3
CANISTER SUCT 1200ML W/VALVE (MISCELLANEOUS) ×3 IMPLANT
CHLORAPREP W/TINT 26 (MISCELLANEOUS) ×3 IMPLANT
CLIP APPLIE 9.375 MED OPEN (MISCELLANEOUS) ×2 IMPLANT
COVER BACK TABLE 60X90IN (DRAPES) ×3 IMPLANT
COVER MAYO STAND STRL (DRAPES) ×3 IMPLANT
COVER PROBE W GEL 5X96 (DRAPES) ×3 IMPLANT
DERMABOND ADVANCED (GAUZE/BANDAGES/DRESSINGS) ×1
DERMABOND ADVANCED .7 DNX12 (GAUZE/BANDAGES/DRESSINGS) ×2 IMPLANT
DRAPE LAPAROSCOPIC ABDOMINAL (DRAPES) ×3 IMPLANT
DRAPE LAPAROTOMY 100X72 PEDS (DRAPES) ×3 IMPLANT
DRAPE UTILITY XL STRL (DRAPES) ×3 IMPLANT
ELECT REM PT RETURN 9FT ADLT (ELECTROSURGICAL) ×3
ELECTRODE REM PT RTRN 9FT ADLT (ELECTROSURGICAL) ×2 IMPLANT
GAUZE SPONGE 4X4 12PLY STRL LF (GAUZE/BANDAGES/DRESSINGS) IMPLANT
GLOVE SURG POLYISO LF SZ6.5 (GLOVE) ×3 IMPLANT
GLOVE SURG POLYISO LF SZ7.5 (GLOVE) ×3 IMPLANT
GLOVE SURG UNDER POLY LF SZ7 (GLOVE) ×3 IMPLANT
GOWN STRL REUS W/ TWL LRG LVL3 (GOWN DISPOSABLE) ×2 IMPLANT
GOWN STRL REUS W/ TWL XL LVL3 (GOWN DISPOSABLE) ×2 IMPLANT
GOWN STRL REUS W/TWL LRG LVL3 (GOWN DISPOSABLE) ×3
GOWN STRL REUS W/TWL XL LVL3 (GOWN DISPOSABLE) ×3
KIT MARKER MARGIN INK (KITS) ×3 IMPLANT
NEEDLE HYPO 25X1 1.5 SAFETY (NEEDLE) ×3 IMPLANT
NS IRRIG 1000ML POUR BTL (IV SOLUTION) ×3 IMPLANT
PACK BASIN DAY SURGERY FS (CUSTOM PROCEDURE TRAY) ×3 IMPLANT
PENCIL SMOKE EVACUATOR (MISCELLANEOUS) ×3 IMPLANT
SLEEVE SCD COMPRESS KNEE MED (STOCKING) ×3 IMPLANT
SPONGE LAP 4X18 RFD (DISPOSABLE) ×3 IMPLANT
SUT MNCRL AB 4-0 PS2 18 (SUTURE) ×3 IMPLANT
SUT SILK 2 0 SH (SUTURE) IMPLANT
SUT VIC AB 3-0 SH 27 (SUTURE) ×6
SUT VIC AB 3-0 SH 27X BRD (SUTURE) ×4 IMPLANT
SYR BULB EAR ULCER 3OZ GRN STR (SYRINGE) IMPLANT
SYR CONTROL 10ML LL (SYRINGE) ×3 IMPLANT
TOWEL GREEN STERILE FF (TOWEL DISPOSABLE) ×3 IMPLANT
TRAY FAXITRON CT DISP (TRAY / TRAY PROCEDURE) ×3 IMPLANT
TUBE CONNECTING 20X1/4 (TUBING) ×3 IMPLANT
YANKAUER SUCT BULB TIP NO VENT (SUCTIONS) ×3 IMPLANT

## 2021-02-07 NOTE — Transfer of Care (Signed)
Immediate Anesthesia Transfer of Care Note  Patient: Melody Rodriguez  Procedure(s) Performed: RIGHT BREAST LUMPECTOMY WITH RADIOACTIVE SEED AND SEED TARGETED LYMPH NODE BIOPSY AND SENTINEL LYMPH NODE BIOPSY (Right Breast) REMOVAL PORT-A-CATH (Left Chest)  Patient Location: PACU  Anesthesia Type:General  Level of Consciousness: drowsy  Airway & Oxygen Therapy: Patient Spontanous Breathing and Patient connected to face mask oxygen  Post-op Assessment: Report given to RN and Post -op Vital signs reviewed and stable  Post vital signs: Reviewed and stable  Last Vitals:  Vitals Value Taken Time  BP    Temp    Pulse    Resp    SpO2      Last Pain:  Vitals:   02/07/21 1103  TempSrc: Oral  PainSc: 0-No pain      Patients Stated Pain Goal: 5 (74/14/23 9532)  Complications: No complications documented.

## 2021-02-07 NOTE — Anesthesia Preprocedure Evaluation (Addendum)
Anesthesia Evaluation  Patient identified by MRN, date of birth, ID band Patient awake    Reviewed: Allergy & Precautions, NPO status , Patient's Chart, lab work & pertinent test results  History of Anesthesia Complications Negative for: history of anesthetic complications  Airway Mallampati: II  TM Distance: >3 FB Neck ROM: Full    Dental  (+) Dental Advisory Given   Pulmonary neg pulmonary ROS, former smoker,    Pulmonary exam normal        Cardiovascular negative cardio ROS Normal cardiovascular exam     Neuro/Psych negative neurological ROS     GI/Hepatic negative GI ROS, Neg liver ROS,   Endo/Other  negative endocrine ROS  Renal/GU negative Renal ROS  negative genitourinary   Musculoskeletal negative musculoskeletal ROS (+)   Abdominal   Peds  Hematology negative hematology ROS (+)   Anesthesia Other Findings  Breast cancer  Normal echo 08/27/20  Reproductive/Obstetrics                            Anesthesia Physical Anesthesia Plan  ASA: II  Anesthesia Plan: General   Post-op Pain Management: GA combined w/ Regional for post-op pain   Induction: Intravenous  PONV Risk Score and Plan: 3 and Ondansetron, Dexamethasone, Midazolam and Treatment may vary due to age or medical condition  Airway Management Planned: LMA  Additional Equipment: None  Intra-op Plan:   Post-operative Plan: Extubation in OR  Informed Consent: I have reviewed the patients History and Physical, chart, labs and discussed the procedure including the risks, benefits and alternatives for the proposed anesthesia with the patient or authorized representative who has indicated his/her understanding and acceptance.     Dental advisory given  Plan Discussed with:   Anesthesia Plan Comments:         Anesthesia Quick Evaluation

## 2021-02-07 NOTE — Discharge Instructions (Signed)
[place] Office Phone Number [phone]  BREAST BIOPSY/ PARTIAL MASTECTOMY: POST OP INSTRUCTIONS  Always review your discharge instruction sheet given to you by the facility where your surgery was performed.  IF YOU HAVE DISABILITY OR FAMILY LEAVE FORMS, YOU MUST BRING THEM TO THE OFFICE FOR PROCESSING.  DO NOT GIVE THEM TO YOUR DOCTOR.  1. A prescription for pain medication may be given to you upon discharge.  Take your pain medication as prescribed, if needed.  If narcotic pain medicine is not needed, then you may take acetaminophen (Tylenol) or ibuprofen (Advil) as needed. Next dose of Tylenol can be given at 5:15pm if needed. Next dose of Oxycodone can be given at 9:00pm if needed. 2. Take your usually prescribed medications unless otherwise directed 3. If you need a refill on your pain medication, please contact your pharmacy.  They will contact our office to request authorization.  Prescriptions will not be filled after 5pm or on week-ends. 4. You should eat very light the first 24 hours after surgery, such as soup, crackers, pudding, etc.  Resume your normal diet the day after surgery. 5. Most patients will experience some swelling and bruising in the breast.  Ice packs and a good support bra will help.  Swelling and bruising can take several days to resolve.  6. It is common to experience some constipation if taking pain medication after surgery.  Increasing fluid intake and taking a stool softener will usually help or prevent this problem from occurring.  A mild laxative (Milk of Magnesia or Miralax) should be taken according to package directions if there are no bowel movements after 48 hours. 7. Unless discharge instructions indicate otherwise, you may remove your bandages 24-48 hours after surgery, and you may shower at that time.  You may have steri-strips (small skin tapes) in place directly over the incision.  These strips should be left on the skin for 7-10 days.   If your surgeon used skin glue on the incision, you may shower in 24 hours.  The glue will flake off over the next 2-3 weeks.  Any sutures or staples will be removed at the office during your follow-up visit. 8. ACTIVITIES:  You may resume regular daily activities (gradually increasing) beginning the next day.  Wearing a good support bra or sports bra minimizes pain and swelling.  You may have sexual intercourse when it is comfortable. a. You may drive when you no longer are taking prescription pain medication, you can comfortably wear a seatbelt, and you can safely maneuver your car and apply brakes. b. RETURN TO WORK:  ______________________________________________________________________________________ 9. You should see your doctor in the office for a follow-up appointment approximately two weeks after your surgery.  Your doctor's nurse will typically make your follow-up appointment when she calls you with your pathology report.  Expect your pathology report 2-3 business days after your surgery.  You may call to check if you do not hear from [place] after three days. 10. OTHER INSTRUCTIONS:OK TO REMOVE THE BINDER AND SHOWER STARTING TOMORROW 11. ICE PACK, TYLENOL, AND IBUPROFEN ALSO FOR PAIN 12. NO VIGOROUS ACTIVITY FOR ONE WEEK _______________________________________________________________________________________________ _____________________________________________________________________________________________________________________________________ _____________________________________________________________________________________________________________________________________ _____________________________________________________________________________________________________________________________________  WHEN TO CALL YOUR DOCTOR: 1. Fever over 101.0 2. Nausea and/or vomiting. 3. Extreme swelling or bruising. 4. Continued bleeding from incision. 5. Increased pain, redness, or drainage  from the incision.  The clinic staff is available to answer your questions during regular business hours.  Please don't hesitate to call and ask to speak  to one of the nurses for clinical concerns.  If you have a medical emergency, go to the nearest emergency room or call 911.  A surgeon from [facility] is always on call at the hospital.  For further questions, please visit [email or web address]     Post Anesthesia Home Care Instructions  Activity: Get plenty of rest for the remainder of the day. A responsible individual must stay with you for 24 hours following the procedure.  For the next 24 hours, DO NOT: -Drive a car -Paediatric nurse -Drink alcoholic beverages -Take any medication unless instructed by your physician -Make any legal decisions or sign important papers.  Meals: Start with liquid foods such as gelatin or soup. Progress to regular foods as tolerated. Avoid greasy, spicy, heavy foods. If nausea and/or vomiting occur, drink only clear liquids until the nausea and/or vomiting subsides. Call your physician if vomiting continues.  Special Instructions/Symptoms: Your throat may feel dry or sore from the anesthesia or the breathing tube placed in your throat during surgery. If this causes discomfort, gargle with warm salt water. The discomfort should disappear within 24 hours.

## 2021-02-07 NOTE — Progress Notes (Signed)
Nuc med at bedside to do right breast injections.  VSS, pt tolerate well.

## 2021-02-07 NOTE — Progress Notes (Signed)
Assisted Dr. Witman with right, ultrasound guided, pectoralis block. Side rails up, monitors on throughout procedure. See vital signs in flow sheet. Tolerated Procedure well. 

## 2021-02-07 NOTE — Anesthesia Procedure Notes (Signed)
Anesthesia Regional Block: Pectoralis block   Pre-Anesthetic Checklist: ,, timeout performed, Correct Patient, Correct Site, Correct Laterality, Correct Procedure, Correct Position, site marked, Risks and benefits discussed,  Surgical consent,  Pre-op evaluation,  At surgeon's request and post-op pain management  Laterality: Right  Prep: chloraprep       Needles:  Injection technique: Single-shot  Needle Type: Echogenic Stimulator Needle     Needle Length: 10cm  Needle Gauge: 20     Additional Needles:   Procedures:,,,, ultrasound used (permanent image in chart),,,,  Narrative:  Start time: 02/07/2021 12:02 PM End time: 02/07/2021 12:06 PM Injection made incrementally with aspirations every 5 mL.  Performed by: Personally  Anesthesiologist: Lidia Collum, MD  Additional Notes: Standard monitors applied. Skin prepped. Good needle visualization with ultrasound. Injection made in 5cc increments with no resistance to injection. Patient tolerated the procedure well.

## 2021-02-07 NOTE — Interval H&P Note (Signed)
History and Physical Interval Note:no change in H and P  02/07/2021 11:57 AM  Armando Reichert  has presented today for surgery, with the diagnosis of RIGHT BREAST CANCER.  The various methods of treatment have been discussed with the patient and family. After consideration of risks, benefits and other options for treatment, the patient has consented to  Procedure(s): RIGHT BREAST LUMPECTOMY WITH RADIOACTIVE SEED AND TARGETED LYMPH NODE DISSECTION (Right) REMOVAL PORT-A-CATH (N/A) as a surgical intervention.  The patient's history has been reviewed, patient examined, no change in status, stable for surgery.  I have reviewed the patient's chart and labs.  Questions were answered to the patient's satisfaction.     [name]

## 2021-02-07 NOTE — Anesthesia Procedure Notes (Signed)
Procedure Name: LMA Insertion Date/Time: 02/07/2021 12:28 PM Performed by: Maryella Shivers, CRNA Pre-anesthesia Checklist: Patient identified, Emergency Drugs available, Suction available and Patient being monitored Patient Re-evaluated:Patient Re-evaluated prior to induction Oxygen Delivery Method: Circle system utilized Preoxygenation: Pre-oxygenation with 100% oxygen Induction Type: IV induction Ventilation: Mask ventilation without difficulty LMA: LMA inserted LMA Size: 4.0 Number of attempts: 1 Airway Equipment and Method: Bite block Placement Confirmation: positive ETCO2 Tube secured with: Tape Dental Injury: Teeth and Oropharynx as per pre-operative assessment

## 2021-02-07 NOTE — Anesthesia Postprocedure Evaluation (Signed)
Anesthesia Post Note  Patient: Melody Rodriguez  Procedure(s) Performed: RIGHT BREAST LUMPECTOMY WITH RADIOACTIVE SEED AND SEED TARGETED LYMPH NODE BIOPSY AND SENTINEL LYMPH NODE BIOPSY (Right Breast) REMOVAL PORT-A-CATH (Left Chest)     Patient location during evaluation: PACU Anesthesia Type: General Level of consciousness: awake and alert Pain management: pain level controlled Vital Signs Assessment: post-procedure vital signs reviewed and stable Respiratory status: spontaneous breathing, nonlabored ventilation and respiratory function stable Cardiovascular status: blood pressure returned to baseline and stable Postop Assessment: no apparent nausea or vomiting Anesthetic complications: no   No complications documented.  Last Vitals:  Vitals:   02/07/21 1439 02/07/21 1500  BP: 137/76 139/79  Pulse: 79 78  Resp: 16 16  Temp:  37 C  SpO2: 95% 96%    Last Pain:  Vitals:   02/07/21 1414  TempSrc:   PainSc: Asleep                 Lidia Collum

## 2021-02-07 NOTE — Op Note (Signed)
Melody Rodriguez 02/07/2021   Pre-op Diagnosis: RIGHT BREAST CANCER     Post-op Diagnosis: same  Procedure(s): RADIOACTIVE SEED GUIDED RIGHT BREAST LUMPECTOMY TARGETED RIGHT AXILLARY LYMPH NODE DISSECTION REMOVAL PORT-A-CATH  Surgeon(s): [name], MD  Anesthesia: General  Staff:  Circulator: Izora Ribas, RN Relief Circulator: Eston Esters, RN Relief Scrub: Donnita Falls, RN Scrub Person: Charisse March, RN  Estimated Blood Loss: Minimal               Specimens: sent to path  Indications: This is a 45 year old female diagnosed with a right breast cancer late last year.  She had a biopsy of the cancer as well as a separate area in the right breast showing ADH.  She also had a lymph node biopsy showing metastatic disease.  She underwent neoadjuvant chemotherapy and has had a good response to chemotherapy with reduction of the size of the tumor and a significant decrease in the size of the axillary lymph nodes. The decision was made to proceed with a radioactive seed guided right breast lumpectomy with the seed in the malignancy and another seed in the area of ADH as well as a targeted lymph node dissection  Procedure: The patient was brought to the operating room and identifies correct patient.  She is placed upon the operating table general anesthesia was induced.  Her right breast and axilla were prepped and draped in usual sterile fashion.  Using the neoprobe identify the 2 separate radioactive seeds in the upper outer quadrant of the breast.  I anesthetized the lateral edge of the right areola with Marcaine and then made a circumareolar incision with a scalpel.  I then dissected down to the breast tissue with electrocautery.  I performed a wide lumpectomy to incorporate both seeds going down to the chest wall and undermining the skin significantly.  All the anterior breast tissue was removed.  I then took dissection down again laterally to the chest wall and  then completed the lumpectomy coming completely around both seeds with the aid of the neoprobe.  Once the specimen was removed I marked all margins with paint.  An x-ray was performed confirming the previous biopsy clips and radioactive seeds were both in the lumpectomy specimen. I next turned my attention toward the right axilla.  Identified an area of increased uptake from the radioactive seed at the targeted node with the neoprobe.  I anesthetized skin with Marcaine and made incision in the right axilla.  I then dissected into the deep axillary tissue.  I was able to identify the lymph node with the biopsy clip and radioactive seed.  I excised this with multiple surrounding lymph nodes with electrocautery.  Hemostasis was achieved with the cautery and several clips.  I again evaluate the nodal basin with the neoprobe and saw no other increased uptake and palpated no other enlarged lymph nodes I anesthetized both incisions further.  I placed surgical clips around the biopsy cavity of the breast.  I then closed both incisions with interrupted 3-0 Vicryl sutures in the subcutaneous tissue and running 4-0 Monocryl sutures. I next anesthetized the skin of the scar from the Port-A-Cath on the left chest with Marcaine.  I made incision with a scalpel and dissected down to the port.  I freed up attachments to the port with the cautery and then was able to remove the port and the catheter in its entirety.  I closed off the catheter tract with a figure-of-eight 3-0 Vicryl suture.  I then closed subtenons tissue with interrupted 3-0 Vicryl sutures and closed the skin with running 4-0 Monocryl.  Dermabond was applied to all incisions.  The patient was placed in a breast binder.  She tolerated the procedure well.  All the counts were correct at the end of the procedure.  She was then extubated in the operating room and taken in a stable condition to the recovery room.          [name]   Date: 02/07/2021  Time:  1:46 PM

## 2021-02-08 ENCOUNTER — Encounter (HOSPITAL_BASED_OUTPATIENT_CLINIC_OR_DEPARTMENT_OTHER): Payer: Self-pay | Admitting: Surgery

## 2021-02-10 NOTE — Progress Notes (Signed)
Patient ID: Melody Rodriguez, female   DOB: 21-Mar-1976, 45 y.o.   MRN: [id number] [name] presents for discussion of amenorrhea following completion of her chemotherapy for breast cancer. She reports heavy painful cycles prior to chemotherapy. However she has not had a cycle since starting and completing her chemotherapy. She still has radiation therapy to completed and lumpectomy with Sentile node dissection.   PE AF VSS Lungs clear  Heart RRR Abd soft + BS GU deferred  A/P Secondary Amenorrhea d/t to chemotherapy.  Discussed with pt. Pt was wondering she needed a hysterectomy. Pt informed that her cycles may not return since treatment for breast cancer. She is not sure if she will be on a chemopreventive medication after surgery and radiation. Will obtain GYN U/S and have her return for further discussion. I did discuss relation between breast cancer and ovarian cancer as well as endometrial hyperplasia and chemopreventive treatment.

## 2021-02-11 ENCOUNTER — Other Ambulatory Visit: Payer: Self-pay | Admitting: *Deleted

## 2021-02-11 ENCOUNTER — Encounter: Payer: Self-pay | Admitting: *Deleted

## 2021-02-14 NOTE — Progress Notes (Signed)
Patient Care Team: Patient, [name] as PCP - General (General Practice) Rockwell Germany, RN as Oncology Nurse Navigator Mauro Kaufmann, RN as Oncology Nurse Navigator [name], MD as Consulting Physician (General Surgery) Nicholas Lose, MD as Consulting Physician (Hematology and Oncology) Gery Pray, MD as Consulting Physician (Radiation Oncology)  DIAGNOSIS:    ICD-10-CM   1. Malignant neoplasm of upper-outer quadrant of right breast in female, estrogen receptor positive ([place])  C50.411    Z17.0     SUMMARY OF ONCOLOGIC HISTORY: Oncology History  Malignant neoplasm of upper-outer quadrant of right breast in female, estrogen receptor positive ([place])  07/28/2020 Initial Diagnosis   Patient palpated a right breast mass for 1-2 years. Mammogram showed a 2.2cm mass at the 11 o'clock position with surrounding calcifications, 6.4cm in total extent, and up to 5 abnormal right axillary lymph nodes. 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    08/05/2020 Miscellaneous   MammaPrint: High risk luminal type B   08/11/2020 Genetic Testing   Negative genetic testing: no pathogenic variants detected in [facility] Breast Cancer STAT Panel or Common Hereditary Cancers panel. The report dates are August 11, 2020 and August 19, 2020, respectively. Two variants of uncertain signficance were detected - one in the CTNNA1 gene called c.86del and the second in the MLH1 gene called c.808A>G.   The STAT Breast cancer panel offered by [facility] includes sequencing and rearrangement analysis for the following 9 genes:  ATM, BRCA1, BRCA2, CDH1, CHEK2, PALB2, PTEN, STK11 and TP53.  The Common Hereditary Cancers Panel offered by [facility] includes sequencing and/or deletion duplication testing of the following 48 genes: APC, ATM, AXIN2, BARD1, BMPR1A, BRCA1,  BRCA2, BRIP1, CDH1, CDK4, CDKN2A (p14ARF), CDKN2A (p16INK4a), CHEK2, CTNNA1, DICER1, EPCAM (Deletion/duplication testing only), GREM1 (promoter region deletion/duplication testing only), KIT, MEN1, MLH1, MSH2, MSH3, MSH6, MUTYH, NBN, NF1, NTHL1, PALB2, PDGFRA, PMS2, POLD1, POLE, PTEN, RAD50, RAD51C, RAD51D, RNF43, SDHB, SDHC, SDHD, SMAD4, SMARCA4. STK11, TP53, TSC1, TSC2, and VHL.  The following genes were evaluated for sequence changes only: SDHA and HOXB13 c.251G>A variant only.    09/01/2020 -  Chemotherapy    Patient is on Treatment Plan: BREAST DOSE DENSE AC Q14D / PACLITAXEL D1,8,15 Q21D (CARBO REMOVED BY DR. Lindi Adie)      02/07/2021 Surgery   Right lumpectomy [name]): invasive and in situ ductal carcinoma, 2.8cm, clear margins, with metastatic carcinoma in 1/1 right axillary lymph nodes.     CHIEF COMPLIANT: Follow-up of right breast cancer s/p lumpectomy   INTERVAL HISTORY: Melody Rodriguez is a 45 y.o. with above-mentioned history of right breast who completed neoadjuvant chemotherapy. She underwent a right lumpectomy on 02/07/21 with Dr. [name] for which pathology showed invasive and in situ ductal carcinoma, 2.8cm, clear margins, with metastatic carcinoma in 1/1 right axillary lymph nodes. She presents to the clinic todayto review the pathology report and discuss further treatment.  Her major complaint today is seroma in the right axilla which is causing her significant pain and discomfort.  ALLERGIES:  is allergic to cephalexin and latex.  MEDICATIONS:  Current Outpatient Medications  Medication Sig Dispense Refill  . cyclobenzaprine (FLEXERIL) 5 MG tablet TAKE 1 TABLET BY MOUTH 3 TIMES DAILY AS NEEDED FOR MUSCLE SPASMS. 20 tablet 0  . loratadine (CLARITIN) 10 MG tablet Take 10 mg by mouth daily.    Marland Kitchen  oxyCODONE (OXY IR/ROXICODONE) 5 MG immediate release tablet Take 1 tablet (5 mg total) by mouth every 6 (six) hours as needed for moderate pain or severe pain. 30 tablet 0  .  venlafaxine XR (EFFEXOR-XR) 37.5 MG 24 hr capsule TAKE 1 CAPSULE BY MOUTH DAILY WITH BREAKFAST 30 capsule 6   No current facility-administered medications for this visit.    PHYSICAL EXAMINATION: ECOG PERFORMANCE STATUS: 1 - Symptomatic but completely ambulatory  Vitals:   02/15/21 0934  BP: (!) 143/73  Pulse: 88  Resp: 18  Temp: 97.6 F (36.4 C)  SpO2: 99%   Filed Weights   02/15/21 0934  Weight: 219 lb 6.4 oz (99.5 kg)    LABORATORY DATA:  I have reviewed the data as listed CMP Latest Ref Rng & Units 02/03/2021 01/11/2021 01/04/2021  Glucose 70 - 99 mg/dL 197(H) 211(H) 180(H)  BUN 6 - 20 mg/dL _0 Creatinine 0.44 - 1.00 mg/dL 0.56 0.69 0.68  Sodium 135 - 145 mmol/L 135 140 142  Potassium 3.5 - 5.1 mmol/L 4.2 4.0 3.8  Chloride 98 - 111 mmol/L 101 108 108  CO2 22 - 32 mmol/L 22 21(L) 24  Calcium 8.9 - 10.3 mg/dL 9.7 9.0 8.7(L)  Total Protein 6.5 - 8.1 g/dL - 7.3 6.6  Total Bilirubin 0.3 - 1.2 mg/dL - 0.2(L) <0.2(L)  Alkaline Phos 38 - 126 U/L - 56 52  AST 15 - 41 U/L - 22 15  ALT 0 - 44 U/L - 29 18    Lab Results  Component Value Date   WBC 5.4 01/11/2021   HGB 12.6 01/11/2021   HCT 36.8 01/11/2021   MCV 96.1 01/11/2021   PLT 239 01/11/2021   NEUTROABS 3.5 01/11/2021    ASSESSMENT & PLAN:  Malignant neoplasm of upper-outer quadrant of right breast in female, estrogen receptor positive ([place]) 07/28/2020:Patient palpated a right breast mass for 1-2 years. Mammogram showed a 2.2cm mass at the 11 o'clock position with surrounding calcifications, 6.4cm in total extent, and up to 5 abnormal right axillary lymph nodes. 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   Treatment plan:  1. Neoadjuvant chemotherapy (MammaPrint test High Risk): AC foll by Taxol  2. Right lumpectomy: 02/07/2021: Grade 2 IDC 2.8 cm with DCIS, margins negative,  lymphovascular space invasion present, 1/1 lymph node positive with extracapsular extension, ER 50% weak, PR 20% strong, HER2 negative, Ki-67 20% 3. Adjuvant radiation therapy  4. Follow-up adjuvant antiestrogen therapy  [facility] 16070: Treatment of refractory nausea  -------------------------------------------------------------------------------------------------------------------------------  Pathology counseling: I discussed the final pathology report of the patient provided  a copy of this report. I discussed the margins as well as lymph node surgeries. We also discussed the final staging along with previously performed ER/PR and HER-2/neu testing.  Recommendation: 1.  Axillary lymph node dissection 2. Adjuvant radiation 3.  Followed by adjuvant antiestrogen therapy  Seroma in the right axilla causing severe pain: I do not think there is any infection.  I sent additional prescription for oxycodone because she is almost out of the initial prescription given by her surgeon.  We discussed the pros and cons of continuing narcotic pain medications including the risk of addiction.  She will start weaning off and taking Tylenol or Advil if necessary.  Return to clinic after radiation to start antiestrogen therapy.    No orders  of the defined types were placed in this encounter.  The patient has a good understanding of the overall plan. she agrees with it. she will call with any problems that may develop before the next visit here.  Total time spent: 30 mins including face to face time and time spent for planning, charting and coordination of care  Rulon Eisenmenger, MD, MPH 02/15/2021  I, Cloyde Reams Dorshimer, am acting as scribe for Dr. Nicholas Lose.  I have reviewed the above documentation for accuracy and completeness, and I agree with the above.

## 2021-02-15 ENCOUNTER — Other Ambulatory Visit: Payer: Self-pay

## 2021-02-15 ENCOUNTER — Encounter: Payer: Self-pay | Admitting: *Deleted

## 2021-02-15 ENCOUNTER — Inpatient Hospital Stay: Payer: Medicaid Other | Attending: Hematology and Oncology | Admitting: Hematology and Oncology

## 2021-02-15 ENCOUNTER — Other Ambulatory Visit: Payer: Self-pay | Admitting: Surgery

## 2021-02-15 ENCOUNTER — Other Ambulatory Visit (HOSPITAL_COMMUNITY): Payer: Self-pay

## 2021-02-15 ENCOUNTER — Encounter: Payer: Self-pay | Admitting: Hematology and Oncology

## 2021-02-15 DIAGNOSIS — Z170 Estrogen receptor positive status [ER+]: Secondary | ICD-10-CM | POA: Insufficient documentation

## 2021-02-15 DIAGNOSIS — C50411 Malignant neoplasm of upper-outer quadrant of right female breast: Secondary | ICD-10-CM | POA: Diagnosis present

## 2021-02-15 DIAGNOSIS — Z79899 Other long term (current) drug therapy: Secondary | ICD-10-CM | POA: Insufficient documentation

## 2021-02-15 DIAGNOSIS — C773 Secondary and unspecified malignant neoplasm of axilla and upper limb lymph nodes: Secondary | ICD-10-CM | POA: Insufficient documentation

## 2021-02-15 MED ORDER — OXYCODONE HCL 5 MG PO TABS
5.0000 mg | ORAL_TABLET | Freq: Four times a day (QID) | ORAL | 0 refills | Status: DC | PRN
  Filled 2021-02-15: qty 30, 7d supply, fill #0

## 2021-02-15 NOTE — Assessment & Plan Note (Signed)
07/28/2020:Patient palpated a right breast mass for 1-2 years. Mammogram showed a 2.2cm mass at the 11 o'clock position with surrounding calcifications, 6.4cm in total extent, and up to 5 abnormal right axillary lymph nodes. 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   Treatment plan:  1. Neoadjuvant chemotherapy (MammaPrint test High Risk): AC foll by Taxol  2. 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 Adjuvant radiation therapy  4. Follow-up adjuvant antiestrogen therapy  [facility] 16070: Treatment of refractory nausea  -------------------------------------------------------------------------------------------------------------------------------  Pathology counseling: I discussed the final pathology report of the patient provided  a copy of this report. I discussed the margins as well as lymph node surgeries. We also discussed the final staging along with previously performed ER/PR and HER-2/neu testing.  Recommendation: 1.  Axillary lymph node dissection 2. Adjuvant radiation 3.  Followed by adjuvant antiestrogen therapy  Return to clinic after radiation to start antiestrogen therapy.

## 2021-02-16 ENCOUNTER — Ambulatory Visit (HOSPITAL_COMMUNITY)
Admission: RE | Admit: 2021-02-16 | Discharge: 2021-02-16 | Disposition: A | Discharge status: Home / Self Care | Payer: Medicaid Other | Source: Ambulatory Visit | Attending: Obstetrics and Gynecology | Admitting: Obstetrics and Gynecology

## 2021-02-16 ENCOUNTER — Other Ambulatory Visit: Payer: Self-pay | Admitting: Surgery

## 2021-02-16 ENCOUNTER — Other Ambulatory Visit: Payer: Self-pay

## 2021-02-16 ENCOUNTER — Emergency Department (HOSPITAL_COMMUNITY)
Admission: EM | Admit: 2021-02-16 | Discharge: 2021-02-16 | Disposition: A | Discharge status: Home / Self Care | Payer: Medicaid Other | Attending: Emergency Medicine | Admitting: Emergency Medicine

## 2021-02-16 DIAGNOSIS — Z170 Estrogen receptor positive status [ER+]: Secondary | ICD-10-CM

## 2021-02-16 DIAGNOSIS — C50411 Malignant neoplasm of upper-outer quadrant of right female breast: Secondary | ICD-10-CM | POA: Diagnosis not present

## 2021-02-16 DIAGNOSIS — Z5321 Procedure and treatment not carried out due to patient leaving prior to being seen by health care provider: Secondary | ICD-10-CM | POA: Diagnosis not present

## 2021-02-16 DIAGNOSIS — T889XXA Complication of surgical and medical care, unspecified, initial encounter: Secondary | ICD-10-CM | POA: Insufficient documentation

## 2021-02-16 LAB — SURGICAL PATHOLOGY

## 2021-02-16 NOTE — ED Triage Notes (Signed)
Pt reports having lymph node removal surgery last Monday and today site began bleeding in R axilla. Went to [facility] today who advised her to come to Ed because they did not have the instruments to properly drain the area in the office.

## 2021-02-16 NOTE — ED Provider Notes (Signed)
Emergency Medicine Provider Triage Evaluation Note  Karisma Meiser , a 45 y.o. female  was evaluated in triage.  Pt complains of post-op complication.  Review of Systems  Positive: Pain to R axillary surgical site, drainage Negative: fever  Physical Exam  BP (!) 141/77 (BP Location: Left Arm)   Pulse 92   Temp 99.1 F (37.3 C) (Oral)   Resp 14   SpO2 98%  Gen:   Awake, no distress, appears frustrated Resp:  Normal effort  MSK:   Moves extremities without difficulty  Other:  R axillary region: dressing in place, it is saturated with blood.  ttp  Medical Decision Making  Medically screening exam initiated at 6:59 PM.  Appropriate orders placed.  Momo Braun was informed that the remainder of the evaluation will be completed by another provider, this initial triage assessment does not replace that evaluation, and the importance of remaining in the ED until their evaluation is complete.  Pt request dressing change to R axillary region after she had lymph node removal over a week ago but it started to drain and became painful today.  Report Dr. Georgette Dover performed some drainage procedure today and was told to come to ER if worsening pain.    Domenic Moras, PA-C 02/16/21 Creed Copper, MD 02/16/21 2011

## 2021-02-16 NOTE — ED Notes (Signed)
Post-op bleeding under arm. Removed and redressed gauze & bandage at wound site

## 2021-02-17 ENCOUNTER — Encounter: Payer: Self-pay | Admitting: *Deleted

## 2021-02-17 ENCOUNTER — Encounter (HOSPITAL_BASED_OUTPATIENT_CLINIC_OR_DEPARTMENT_OTHER): Payer: Self-pay | Admitting: Surgery

## 2021-02-17 NOTE — H&P (Signed)
Melody Rodriguez is an 45 y.o. female.   Chief Complaint: right axillary hematoma HPI: She is status post a radioactive seed guided right breast lumpectomy with targeted lymph node dissection Port-A-Cath removal.  Apparently, she developed a hematoma in the right axilla which is symptomatic and required drainage.  At the time of her initial surgery, the 1 targeted lymph node removed was still positive for malignancy despite neoadjuvant therapy.  Past Medical History:  Diagnosis Date  . Breast cancer ([place])   . Family history of colon cancer     Past Surgical History:  Procedure Laterality Date  . BREAST CYST EXCISION Right    Patient does not recall (2014 or 2015)  . BREAST LUMPECTOMY WITH RADIOACTIVE SEED AND SENTINEL LYMPH NODE BIOPSY Right 02/07/2021   Procedure: RIGHT BREAST LUMPECTOMY WITH RADIOACTIVE SEED AND SEED TARGETED LYMPH NODE BIOPSY AND SENTINEL LYMPH NODE BIOPSY;  Surgeon: [name], MD;  Location: [facility];  Service: General;  Laterality: Right;  . CESAREAN SECTION    . IR IMAGING GUIDED PORT INSERTION  09/15/2020  . IR REMOVAL TUN CV CATH W/O FL  09/15/2020  . PORT-A-CATH REMOVAL Left 02/07/2021   Procedure: REMOVAL PORT-A-CATH;  Surgeon: [name], MD;  Location: [place] [facility];  Service: General;  Laterality: Left;  . THYROIDECTOMY, PARTIAL      Family History  Problem Relation Age of Onset  . Hypertension Mother   . Colon cancer Sister 7  . Diabetes Maternal Grandmother   . Cancer Maternal Aunt        unknown type dx late 48s   Social History:  reports that she has quit smoking. Her smoking use included cigars. She smoked 1.00 pack per day. She has never used smokeless tobacco. She reports current alcohol use. She reports current drug use. Drug: Marijuana.  Allergies:  Allergies  Allergen Reactions  . Cephalexin Hives and Rash  . Latex Swelling    Itching, swelling    No medications prior to admission.    No  results found for this or any previous visit (from the past 48 hour(s)). US PELVIS (TRANSABDOMINAL ONLY)  Result Date: 02/16/2021 CLINICAL DATA:  Breast cancer, heavy menses for 7 years, unknown LMP EXAM: TRANSABDOMINAL ULTRASOUND OF PELVIS TECHNIQUE: Transabdominal ultrasound examination of the pelvis was performed including evaluation of the uterus, ovaries, adnexal regions, and pelvic cul-de-sac. Image quality degraded secondary to body habitus and inadequate bladder distention/poor acoustic window. Transvaginal imaging was not ordered. COMPARISON:  None. FINDINGS: Uterus Measurements: 8.0 x 4.9 x 4.5 cm = volume: 92 mL. Suboptimally visualized. Heterogeneous myometrium. No gross mass. Endometrium Inadequately visualized due to inadequate bladder distention and body habitus. Right ovary Not visualized, likely obscured by bowel Left ovary Not visualized, likely obscured by bowel Other findings:  No free pelvic fluid or adnexal masses identified. IMPRESSION: Limited exam due to inadequate bladder distention/poor acoustic window and body habitus. Nonvisualization of ovaries and inadequate visualization of endometrial complex. Consider transvaginal imaging for further assessment. Electronically Signed   By: Lavonia Dana M.D.   On: 02/16/2021 18:57    Review of Systems  All other systems reviewed and are negative.   Height 5\' 4"  (1.626 m), weight 99.5 kg. Physical Exam Constitutional:      Appearance: Normal appearance.  HENT:     Head: Normocephalic and atraumatic.     Nose: Nose normal.  Eyes:     Pupils: Pupils are equal, round, and reactive to light.  Cardiovascular:  Rate and Rhythm: Normal rate and regular rhythm.     Pulses: Normal pulses.  Pulmonary:     Effort: Pulmonary effort is normal. No respiratory distress.  Abdominal:     General: Abdomen is flat.     Tenderness: There is no abdominal tenderness.  Musculoskeletal:        General: Normal range of motion.     Cervical back:  Normal range of motion.  Skin:    General: Skin is warm.     Comments: .  There is a hematoma with ecchymosis and tenderness in the right axilla  Neurological:     General: No focal deficit present.     Mental Status: She is alert.  Psychiatric:        Behavior: Behavior normal.      Assessment/Plan Right axillary hematoma  Given her symptom from the hematoma and its size, it required evacuation in the OR.  Unfortunately, it would be difficult at this surgery to proceed with a complete axillary dissection given risks of further bleeding. I discussed the risks which includes but is not limited to further bleeding, injury to surrounding structures, the need for a drain, the need for further procedures, etc. She agrees to proceed.  [name], MD 02/17/2021, 12:32 PM

## 2021-02-17 NOTE — Progress Notes (Signed)
COVID Vaccine Completed: Date COVID Vaccine completed: Has received booster: COVID vaccine manufacturer: [employer]  Date of COVID positive in last 90 days:  PCP -  Cardiologist -   Chest x-ray - 09/12/20 in epic EKG - 09/12/20 in epic Stress Test -  ECHO - 08/27/20 in epic Cardiac Cath -  Pacemaker/ICD device last checked:  Sleep Study -  CPAP -   Fasting Blood Sugar -  Checks Blood Sugar _____ times a day  Blood Thinner Instructions: Aspirin Instructions: Last Dose:  Activity level:  Unable to go up a flight of stairs without symptoms   Can go up a flight of stairs and activities of daily living without stopping and without symptoms   Able to exercise without symptoms     Anesthesia review:   Patient denies shortness of breath, fever, cough and chest pain at PAT appointment   Patient verbalized understanding of instructions that were given to them at the PAT appointment. Patient was also instructed that they will need to review over the PAT instructions again at home before surgery.

## 2021-02-18 ENCOUNTER — Ambulatory Visit (HOSPITAL_BASED_OUTPATIENT_CLINIC_OR_DEPARTMENT_OTHER): Payer: Medicaid Other | Admitting: Anesthesiology

## 2021-02-18 ENCOUNTER — Encounter (HOSPITAL_BASED_OUTPATIENT_CLINIC_OR_DEPARTMENT_OTHER): Payer: Self-pay | Admitting: Surgery

## 2021-02-18 ENCOUNTER — Encounter (HOSPITAL_BASED_OUTPATIENT_CLINIC_OR_DEPARTMENT_OTHER)
Admission: RE | Disposition: A | Discharge status: Home / Self Care | Payer: Self-pay | Source: Home / Self Care | Attending: Surgery

## 2021-02-18 ENCOUNTER — Other Ambulatory Visit: Payer: Self-pay

## 2021-02-18 ENCOUNTER — Ambulatory Visit (HOSPITAL_BASED_OUTPATIENT_CLINIC_OR_DEPARTMENT_OTHER)
Admission: RE | Admit: 2021-02-18 | Discharge: 2021-02-18 | Disposition: A | Discharge status: Home / Self Care | Payer: Medicaid Other | Attending: Surgery | Admitting: Surgery

## 2021-02-18 DIAGNOSIS — Z91040 Latex allergy status: Secondary | ICD-10-CM | POA: Diagnosis not present

## 2021-02-18 DIAGNOSIS — L7632 Postprocedural hematoma of skin and subcutaneous tissue following other procedure: Secondary | ICD-10-CM | POA: Insufficient documentation

## 2021-02-18 DIAGNOSIS — Z87891 Personal history of nicotine dependence: Secondary | ICD-10-CM | POA: Diagnosis not present

## 2021-02-18 DIAGNOSIS — Z853 Personal history of malignant neoplasm of breast: Secondary | ICD-10-CM | POA: Diagnosis not present

## 2021-02-18 DIAGNOSIS — Z881 Allergy status to other antibiotic agents status: Secondary | ICD-10-CM | POA: Insufficient documentation

## 2021-02-18 HISTORY — PX: BREAST BIOPSY: SHX20

## 2021-02-18 LAB — POCT PREGNANCY, URINE: Preg Test, Ur: NEGATIVE

## 2021-02-18 SURGERY — BREAST BIOPSY
Anesthesia: General | Site: Axilla | Laterality: Right

## 2021-02-18 MED ORDER — ACETAMINOPHEN 500 MG PO TABS
ORAL_TABLET | ORAL | Status: AC
  Filled 2021-02-18: qty 2

## 2021-02-18 MED ORDER — OXYCODONE HCL 5 MG/5ML PO SOLN: 5.0000 mg | Freq: Once | ORAL | Status: AC | PRN

## 2021-02-18 MED ORDER — FENTANYL CITRATE (PF) 100 MCG/2ML IJ SOLN
INTRAMUSCULAR | Status: AC
  Filled 2021-02-18: qty 2

## 2021-02-18 MED ORDER — ACETAMINOPHEN 10 MG/ML IV SOLN: 1000.0000 mg | Freq: Once | INTRAVENOUS | Status: DC | PRN

## 2021-02-18 MED ORDER — ACETAMINOPHEN 500 MG PO TABS
1000.0000 mg | ORAL_TABLET | ORAL | Status: AC
  Administered 2021-02-18: 1000 mg via ORAL

## 2021-02-18 MED ORDER — DEXAMETHASONE SODIUM PHOSPHATE 4 MG/ML IJ SOLN
INTRAMUSCULAR | Status: DC | PRN
  Administered 2021-02-18: 10 mg via INTRAVENOUS

## 2021-02-18 MED ORDER — CHLORHEXIDINE GLUCONATE CLOTH 2 % EX PADS: 6.0000 | MEDICATED_PAD | Freq: Once | CUTANEOUS | Status: DC

## 2021-02-18 MED ORDER — OXYCODONE HCL 5 MG PO TABS
5.0000 mg | ORAL_TABLET | Freq: Once | ORAL | Status: AC | PRN
Start: 2021-02-18 — End: 2021-02-18
  Administered 2021-02-18: 5 mg via ORAL

## 2021-02-18 MED ORDER — SCOPOLAMINE 1 MG/3DAYS TD PT72
1.0000 | MEDICATED_PATCH | TRANSDERMAL | Status: DC
  Administered 2021-02-18: 1.5 mg via TRANSDERMAL

## 2021-02-18 MED ORDER — CIPROFLOXACIN IN D5W 400 MG/200ML IV SOLN
400.0000 mg | INTRAVENOUS | Status: AC
  Administered 2021-02-18: 400 mg via INTRAVENOUS

## 2021-02-18 MED ORDER — AMISULPRIDE (ANTIEMETIC) 5 MG/2ML IV SOLN: 10.0000 mg | Freq: Once | INTRAVENOUS | Status: DC | PRN

## 2021-02-18 MED ORDER — BUPIVACAINE-EPINEPHRINE (PF) 0.5% -1:200000 IJ SOLN
INTRAMUSCULAR | Status: AC
  Filled 2021-02-18: qty 30

## 2021-02-18 MED ORDER — MIDAZOLAM HCL 2 MG/2ML IJ SOLN
INTRAMUSCULAR | Status: AC
  Filled 2021-02-18: qty 2

## 2021-02-18 MED ORDER — ONDANSETRON HCL 4 MG/2ML IJ SOLN
INTRAMUSCULAR | Status: DC | PRN
  Administered 2021-02-18: 4 mg via INTRAVENOUS

## 2021-02-18 MED ORDER — FENTANYL CITRATE (PF) 100 MCG/2ML IJ SOLN
INTRAMUSCULAR | Status: DC | PRN
  Administered 2021-02-18: 100 ug via INTRAVENOUS

## 2021-02-18 MED ORDER — EPHEDRINE 5 MG/ML INJ
INTRAVENOUS | Status: AC
  Filled 2021-02-18: qty 10

## 2021-02-18 MED ORDER — LIDOCAINE HCL (PF) 2 % IJ SOLN
INTRAMUSCULAR | Status: AC
  Filled 2021-02-18: qty 5

## 2021-02-18 MED ORDER — LIDOCAINE HCL (CARDIAC) PF 100 MG/5ML IV SOSY
PREFILLED_SYRINGE | INTRAVENOUS | Status: DC | PRN
  Administered 2021-02-18: 60 mg via INTRAVENOUS

## 2021-02-18 MED ORDER — OXYCODONE HCL 5 MG PO TABS
ORAL_TABLET | ORAL | Status: AC
  Filled 2021-02-18: qty 1

## 2021-02-18 MED ORDER — MIDAZOLAM HCL 5 MG/5ML IJ SOLN
INTRAMUSCULAR | Status: DC | PRN
  Administered 2021-02-18: 2 mg via INTRAVENOUS

## 2021-02-18 MED ORDER — DEXAMETHASONE SODIUM PHOSPHATE 10 MG/ML IJ SOLN
INTRAMUSCULAR | Status: AC
  Filled 2021-02-18: qty 1

## 2021-02-18 MED ORDER — PHENYLEPHRINE 40 MCG/ML (10ML) SYRINGE FOR IV PUSH (FOR BLOOD PRESSURE SUPPORT)
PREFILLED_SYRINGE | INTRAVENOUS | Status: AC
  Filled 2021-02-18: qty 10

## 2021-02-18 MED ORDER — BUPIVACAINE-EPINEPHRINE 0.5% -1:200000 IJ SOLN
INTRAMUSCULAR | Status: DC | PRN
  Administered 2021-02-18: 20 mL

## 2021-02-18 MED ORDER — LACTATED RINGERS IV SOLN: INTRAVENOUS | Status: DC

## 2021-02-18 MED ORDER — ACETAMINOPHEN 325 MG PO TABS: 325.0000 mg | ORAL_TABLET | ORAL | Status: DC | PRN

## 2021-02-18 MED ORDER — PROMETHAZINE HCL 25 MG/ML IJ SOLN: 6.2500 mg | INTRAMUSCULAR | Status: DC | PRN

## 2021-02-18 MED ORDER — ONDANSETRON HCL 4 MG/2ML IJ SOLN
INTRAMUSCULAR | Status: AC
  Filled 2021-02-18: qty 2

## 2021-02-18 MED ORDER — ACETAMINOPHEN 160 MG/5ML PO SOLN: 325.0000 mg | ORAL | Status: DC | PRN

## 2021-02-18 MED ORDER — LIDOCAINE HCL (PF) 1 % IJ SOLN
INTRAMUSCULAR | Status: AC
  Filled 2021-02-18: qty 30

## 2021-02-18 MED ORDER — SCOPOLAMINE 1 MG/3DAYS TD PT72
MEDICATED_PATCH | TRANSDERMAL | Status: AC
  Filled 2021-02-18: qty 1

## 2021-02-18 MED ORDER — SUCCINYLCHOLINE CHLORIDE 200 MG/10ML IV SOSY
PREFILLED_SYRINGE | INTRAVENOUS | Status: AC
  Filled 2021-02-18: qty 10

## 2021-02-18 MED ORDER — CIPROFLOXACIN IN D5W 400 MG/200ML IV SOLN
INTRAVENOUS | Status: AC
  Filled 2021-02-18: qty 200

## 2021-02-18 MED ORDER — PROPOFOL 10 MG/ML IV BOLUS
INTRAVENOUS | Status: DC | PRN
  Administered 2021-02-18: 200 mg via INTRAVENOUS

## 2021-02-18 MED ORDER — FENTANYL CITRATE (PF) 100 MCG/2ML IJ SOLN
25.0000 ug | INTRAMUSCULAR | Status: DC | PRN
  Administered 2021-02-18 (×2): 50 ug via INTRAVENOUS

## 2021-02-18 SURGICAL SUPPLY — 44 items
ADH SKN CLS APL DERMABOND .7 (GAUZE/BANDAGES/DRESSINGS) ×1
APL PRP STRL LF DISP 70% ISPRP (MISCELLANEOUS) ×1
BLADE SURG 15 STRL LF DISP TIS (BLADE) ×1 IMPLANT
BLADE SURG 15 STRL SS (BLADE) ×2
CANISTER SUCT 1200ML W/VALVE (MISCELLANEOUS) ×2 IMPLANT
CHLORAPREP W/TINT 26 (MISCELLANEOUS) ×2 IMPLANT
CLIP VESOCCLUDE SM WIDE 6/CT (CLIP) IMPLANT
COVER BACK TABLE 60X90IN (DRAPES) ×2 IMPLANT
COVER MAYO STAND STRL (DRAPES) ×2 IMPLANT
COVER WAND RF STERILE (DRAPES) IMPLANT
DECANTER SPIKE VIAL GLASS SM (MISCELLANEOUS) IMPLANT
DERMABOND ADVANCED (GAUZE/BANDAGES/DRESSINGS) ×1
DERMABOND ADVANCED .7 DNX12 (GAUZE/BANDAGES/DRESSINGS) ×1 IMPLANT
DRAPE LAPAROTOMY 100X72 PEDS (DRAPES) ×2 IMPLANT
DRAPE UTILITY XL STRL (DRAPES) ×2 IMPLANT
ELECT REM PT RETURN 9FT ADLT (ELECTROSURGICAL) ×2
ELECTRODE REM PT RTRN 9FT ADLT (ELECTROSURGICAL) ×1 IMPLANT
GAUZE SPONGE 4X4 12PLY STRL LF (GAUZE/BANDAGES/DRESSINGS) IMPLANT
GLOVE SURG SIGNA 7.5 PF LTX (GLOVE) ×2 IMPLANT
GOWN STRL REUS W/ TWL LRG LVL3 (GOWN DISPOSABLE) ×1 IMPLANT
GOWN STRL REUS W/ TWL XL LVL3 (GOWN DISPOSABLE) ×1 IMPLANT
GOWN STRL REUS W/TWL LRG LVL3 (GOWN DISPOSABLE) ×2
GOWN STRL REUS W/TWL XL LVL3 (GOWN DISPOSABLE) ×2
HEMOSTAT ARISTA ABSORB 3G PWDR (HEMOSTASIS) ×2 IMPLANT
KIT MARKER MARGIN INK (KITS) IMPLANT
NEEDLE HYPO 25X1 1.5 SAFETY (NEEDLE) ×2 IMPLANT
NS IRRIG 1000ML POUR BTL (IV SOLUTION) ×2 IMPLANT
PACK BASIN DAY SURGERY FS (CUSTOM PROCEDURE TRAY) ×2 IMPLANT
PENCIL SMOKE EVACUATOR (MISCELLANEOUS) ×2 IMPLANT
SLEEVE SCD COMPRESS KNEE MED (STOCKING) ×2 IMPLANT
SPONGE LAP 18X18 RF (DISPOSABLE) ×2 IMPLANT
SPONGE LAP 4X18 RFD (DISPOSABLE) ×2 IMPLANT
SUT MNCRL AB 4-0 PS2 18 (SUTURE) ×2 IMPLANT
SUT SILK 2 0 SH (SUTURE) IMPLANT
SUT VIC AB 2-0 SH 27 (SUTURE) ×2
SUT VIC AB 2-0 SH 27XBRD (SUTURE) ×1 IMPLANT
SUT VIC AB 3-0 SH 27 (SUTURE) ×2
SUT VIC AB 3-0 SH 27X BRD (SUTURE) ×1 IMPLANT
SYR BULB EAR ULCER 3OZ GRN STR (SYRINGE) ×2 IMPLANT
SYR CONTROL 10ML LL (SYRINGE) ×2 IMPLANT
TOWEL GREEN STERILE FF (TOWEL DISPOSABLE) ×2 IMPLANT
TRAY FAXITRON CT DISP (TRAY / TRAY PROCEDURE) IMPLANT
TUBE CONNECTING 20X1/4 (TUBING) ×2 IMPLANT
YANKAUER SUCT BULB TIP NO VENT (SUCTIONS) ×2 IMPLANT

## 2021-02-18 NOTE — Op Note (Signed)
EVACUATION HEMATOMA RIGHT AXILLA  Procedure Note  Melody Rodriguez 02/18/2021   Pre-op Diagnosis: POST-OP HEMATOMA - RIGHT AXILLA     Post-op Diagnosis: same  Procedure(s): EVACUATION HEMATOMA RIGHT AXILLA  Surgeon(s): [name], MD  Anesthesia: General  Staff:  Circulator: McDonough-Hughes, Delene Ruffini, RN Scrub Person: Izora Ribas, RN  Estimated Blood Loss: Minimal               Indications; she is 11 days status post a targeted lymph node dissection in the right axilla.  She has developed a postoperative hematoma in the right axilla which is causing significant discomfort.  The decision was made to proceed to the operating room for evacuation of the hematoma  Procedure: The patient was brought to the operating room identifies a correct patient.  She was placed upon the operating room table and general anesthesia was induced.  Her right axilla was prepped and draped in usual sterile fashion.  I anesthetized skin with Marcaine and then opened up the previous incision with a scalpel.  I then entered the axilla and found a large hematoma which I evacuated completely.  I then copiously irrigated the axilla with normal saline.  I saw no obvious bleeding vessel.  There was mild oozing from the raw surfaces circumferentially in the wound.  I was able to achieve hemostasis with the cautery and Arista powder.  After visualizing the wound open for several minutes again hemostasis appeared to be achieved.  I closed the deep axillary tissue with interrupted 2-0 Vicryl sutures.  I then closed subcutaneous tissue with interrupted 3-0 Vicryl sutures and closed the skin with a running 4-0 Monocryl.  Dermabond was then applied.  The patient tolerated the procedure well.  All the counts were correct at the end of the procedure.  The patient was then extubated in the operating room and taken in a stable condition to the recovery room.          [name]   Date: 02/18/2021  Time: 12:17 PM

## 2021-02-18 NOTE — Transfer of Care (Signed)
Immediate Anesthesia Transfer of Care Note  Patient: Melody Rodriguez  Procedure(s) Performed: EVACUATION HEMATOMA RIGHT AXILLA (Right Axilla)  Patient Location: PACU  Anesthesia Type:General  Level of Consciousness: awake, alert , oriented, drowsy and patient cooperative  Airway & Oxygen Therapy: Patient Spontanous Breathing and Patient connected to face mask oxygen  Post-op Assessment: Report given to RN and Post -op Vital signs reviewed and stable  Post vital signs: Reviewed and stable  Last Vitals:  Vitals Value Taken Time  BP    Temp    Pulse    Resp    SpO2      Last Pain:  Vitals:   02/18/21 0856  TempSrc: Oral  PainSc: 7       Patients Stated Pain Goal: 5 (31/49/70 2637)  Complications: No complications documented.

## 2021-02-18 NOTE — Anesthesia Preprocedure Evaluation (Addendum)
Anesthesia Evaluation  Patient identified by MRN, date of birth, ID band Patient awake    Reviewed: Allergy & Precautions, NPO status , Patient's Chart, lab work & pertinent test results  Airway Mallampati: III  TM Distance: >3 FB Neck ROM: Full    Dental  (+) Teeth Intact, Dental Advisory Given   Pulmonary Patient abstained from smoking., former smoker,    breath sounds clear to auscultation       Cardiovascular negative cardio ROS   Rhythm:Regular Rate:Normal     Neuro/Psych negative neurological ROS  negative psych ROS   GI/Hepatic negative GI ROS, Neg liver ROS,   Endo/Other  negative endocrine ROS  Renal/GU negative Renal ROS     Musculoskeletal negative musculoskeletal ROS (+)   Abdominal Normal abdominal exam  (+)   Peds  Hematology negative hematology ROS (+)   Anesthesia Other Findings   Reproductive/Obstetrics                            Anesthesia Physical Anesthesia Plan  ASA: II  Anesthesia Plan: General   Post-op Pain Management:    Induction: Intravenous  PONV Risk Score and Plan: 4 or greater and Ondansetron, Dexamethasone, Midazolam and Scopolamine patch - Pre-op  Airway Management Planned: LMA  Additional Equipment: None  Intra-op Plan:   Post-operative Plan: Extubation in OR  Informed Consent: I have reviewed the patients History and Physical, chart, labs and discussed the procedure including the risks, benefits and alternatives for the proposed anesthesia with the patient or authorized representative who has indicated his/her understanding and acceptance.     Dental advisory given  Plan Discussed with: CRNA  Anesthesia Plan Comments:        Anesthesia Quick Evaluation

## 2021-02-18 NOTE — Discharge Instructions (Signed)
  Post Anesthesia Home Care Instructions  Activity: Get plenty of rest for the remainder of the day. A responsible individual must stay with you for 24 hours following the procedure.  For the next 24 hours, DO NOT: -Drive a car -Paediatric nurse -Drink alcoholic beverages -Take any medication unless instructed by your physician -Make any legal decisions or sign important papers.  Meals: Start with liquid foods such as gelatin or soup. Progress to regular foods as tolerated. Avoid greasy, spicy, heavy foods. If nausea and/or vomiting occur, drink only clear liquids until the nausea and/or vomiting subsides. Call your physician if vomiting continues.  Special Instructions/Symptoms: Your throat may feel dry or sore from the anesthesia or the breathing tube placed in your throat during surgery. If this causes discomfort, gargle with warm salt water. The discomfort should disappear within 24 hours.  If you had a scopolamine patch placed behind your ear for the management of post- operative nausea and/or vomiting:  1. The medication in the patch is effective for 72 hours, after which it should be removed.  Wrap patch in a tissue and discard in the trash. Wash hands thoroughly with soap and water. 2. You may remove the patch earlier than 72 hours if you experience unpleasant side effects which may include dry mouth, dizziness or visual disturbances. 3. Avoid touching the patch. Wash your hands with soap and water after contact with the patch.    Ok to shower starting tomorrow  Ice pack and tylenol for pain  No vigorous activity for one week

## 2021-02-18 NOTE — Interval H&P Note (Signed)
History and Physical Interval Note:no change in H and P  02/18/2021 11:13 AM  Melody Rodriguez  has presented today for surgery, with the diagnosis of POST-OP HEMATOMA - RIGHT AXILLA.  The various methods of treatment have been discussed with the patient and family. After consideration of risks, benefits and other options for treatment, the patient has consented to  Procedure(s): EVACUATION HEMATOMA RIGHT AXILLA (Right) as a surgical intervention.  The patient's history has been reviewed, patient examined, no change in status, stable for surgery.  I have reviewed the patient's chart and labs.  Questions were answered to the patient's satisfaction.     [name]

## 2021-02-18 NOTE — Anesthesia Postprocedure Evaluation (Signed)
Anesthesia Post Note  Patient: Melody Rodriguez  Procedure(s) Performed: EVACUATION HEMATOMA RIGHT AXILLA (Right Axilla)     Patient location during evaluation: PACU Anesthesia Type: General Level of consciousness: awake and alert Pain management: pain level controlled Vital Signs Assessment: post-procedure vital signs reviewed and stable Respiratory status: spontaneous breathing, nonlabored ventilation, respiratory function stable and patient connected to nasal cannula oxygen Cardiovascular status: blood pressure returned to baseline and stable Postop Assessment: no apparent nausea or vomiting Anesthetic complications: no   No complications documented.  Last Vitals:  Vitals:   02/18/21 1430 02/18/21 1445  BP: 124/75   Pulse: 67 66  Resp: 16   Temp: 36.7 C   SpO2: 96% 98%    Last Pain:  Vitals:   02/18/21 1445  TempSrc:   PainSc: Asleep                 Effie Berkshire

## 2021-02-18 NOTE — Anesthesia Procedure Notes (Signed)
Procedure Name: LMA Insertion Date/Time: 02/18/2021 11:42 AM Performed by: Willa Frater, CRNA Pre-anesthesia Checklist: Patient identified, Emergency Drugs available, Suction available and Patient being monitored Patient Re-evaluated:Patient Re-evaluated prior to induction Oxygen Delivery Method: Circle system utilized Preoxygenation: Pre-oxygenation with 100% oxygen Induction Type: IV induction Ventilation: Mask ventilation without difficulty LMA: LMA inserted LMA Size: 4.0 Number of attempts: 1 Airway Equipment and Method: Bite block Placement Confirmation: positive ETCO2 Tube secured with: Tape Dental Injury: Teeth and Oropharynx as per pre-operative assessment

## 2021-02-21 ENCOUNTER — Encounter: Payer: Self-pay | Admitting: Genetic Counselor

## 2021-02-22 ENCOUNTER — Encounter (HOSPITAL_BASED_OUTPATIENT_CLINIC_OR_DEPARTMENT_OTHER): Payer: Self-pay | Admitting: Surgery

## 2021-03-02 ENCOUNTER — Ambulatory Visit (INDEPENDENT_AMBULATORY_CARE_PROVIDER_SITE_OTHER): Payer: Medicaid Other | Admitting: Obstetrics and Gynecology

## 2021-03-02 ENCOUNTER — Encounter: Payer: Self-pay | Admitting: Obstetrics and Gynecology

## 2021-03-02 ENCOUNTER — Other Ambulatory Visit: Payer: Self-pay

## 2021-03-02 DIAGNOSIS — N911 Secondary amenorrhea: Secondary | ICD-10-CM

## 2021-03-02 NOTE — Progress Notes (Signed)
RGYN patient presents for Follow up visit today  Last visit 02/03/21.  Pt had U/S 02/16/21 needs to discuss.  Pt states she wants a hysterectomy.

## 2021-03-02 NOTE — Progress Notes (Signed)
Ms Eilts presents in follow up to discuss U/S results. Secondary amenorrhea due to chemotherapy for breast cancer. GYN U/S was essentially normal, unable to see ovaries.  Since last visit she has had a lumpectomy with node dissection. Evacuation of axillary hematoma.  Stage 2 breast cancer  To start radiation therapy soon and possible antiestrogen therapy afterwards.  PE AF VSS Lungs clear Heart RRR Abd soft + BS   A/P Amenorrhea, secondary         Stage 2 breast cancer   GYN U/S results reviewed with pt. Pt to complete radiation therapy and then follow back up with me.

## 2021-03-04 ENCOUNTER — Encounter: Payer: Self-pay | Admitting: *Deleted

## 2021-03-10 ENCOUNTER — Encounter: Payer: Self-pay | Admitting: Hematology and Oncology

## 2021-03-14 ENCOUNTER — Ambulatory Visit: Payer: Medicaid Other | Admitting: Radiation Oncology

## 2021-03-14 ENCOUNTER — Ambulatory Visit: Payer: Medicaid Other

## 2021-03-18 ENCOUNTER — Encounter (HOSPITAL_BASED_OUTPATIENT_CLINIC_OR_DEPARTMENT_OTHER): Payer: Self-pay | Admitting: Surgery

## 2021-03-18 ENCOUNTER — Other Ambulatory Visit: Payer: Self-pay

## 2021-03-27 NOTE — H&P (Signed)
Melody Rodriguez is an 45 y.o. female.   Chief Complaint: breast cancer, positive right axillary lymph nodes HPI: this is a pleasant 45 year old female that has undergone neoadjuvant chemo for an invasive right breast cancer.  She then had a right breast lumpectomy and targeted lymph node dissection for a pretreatment positive node.  There was still metastatic cancer in the node removed so a complete right axillary lymph node dissection has been recommended.  Her initial post op coarse was complicated by a right axillary post op hematoma that required evacuation in the OR on 6/3. She is now doing well a presents for a right axillary complete nodal dissection.  Past Medical History:  Diagnosis Date   Breast cancer [facility])    Family history of colon cancer     Past Surgical History:  Procedure Laterality Date   BREAST BIOPSY Right 02/18/2021   Procedure: EVACUATION HEMATOMA RIGHT AXILLA;  Surgeon: [name], MD;  Location: [place];  Service: General;  Laterality: Right;   BREAST CYST EXCISION Right    Patient does not recall (2014 or 2015)   BREAST LUMPECTOMY WITH RADIOACTIVE SEED AND SENTINEL LYMPH NODE BIOPSY Right 02/07/2021   Procedure: RIGHT BREAST LUMPECTOMY WITH RADIOACTIVE SEED AND SEED TARGETED LYMPH NODE BIOPSY AND SENTINEL LYMPH NODE BIOPSY;  Surgeon: [name], MD;  Location: [place];  Service: General;  Laterality: Right;   CESAREAN SECTION     IR IMAGING GUIDED PORT INSERTION  09/15/2020   IR REMOVAL TUN CV CATH W/O FL  09/15/2020   PORT-A-CATH REMOVAL Left 02/07/2021   Procedure: REMOVAL PORT-A-CATH;  Surgeon: [name], MD;  Location: [place];  Service: General;  Laterality: Left;   THYROIDECTOMY, PARTIAL      Family History  Problem Relation Age of Onset   Hypertension Mother    Colon cancer Sister 15   Diabetes Maternal Grandmother    Cancer Maternal Aunt        unknown type dx late 30s   Social  History:  reports that she has quit smoking. Her smoking use included cigars. She smoked an average of 1.00 packs per day. She has never used smokeless tobacco. She reports current alcohol use. She reports current drug use. Drug: Marijuana.  Allergies:  Allergies  Allergen Reactions   Cephalexin Hives and Rash   Latex Swelling    Itching, swelling    No medications prior to admission.    No results found for this or any previous visit (from the past 48 hour(s)). No results found.  Review of Systems  All other systems reviewed and are negative.  Height 5\' 4"  (1.626 m), weight 98.9 kg. Physical Exam Constitutional:      General: She is not in acute distress.    Appearance: Normal appearance.  HENT:     Head: Normocephalic and atraumatic.  Eyes:     Pupils: Pupils are equal, round, and reactive to light.  Cardiovascular:     Rate and Rhythm: Normal rate and regular rhythm.  Pulmonary:     Effort: Pulmonary effort is normal. No respiratory distress.     Breath sounds: Normal breath sounds.  Abdominal:     General: Abdomen is flat.     Tenderness: There is no abdominal tenderness.  Musculoskeletal:        General: No swelling. Normal range of motion.     Cervical back: Normal range of motion.  Skin:    General: Skin is warm  and dry.  Neurological:     General: No focal deficit present.     Mental Status: She is alert.  Psychiatric:        Behavior: Behavior normal.        Thought Content: Thought content normal.     Assessment/Plan Right breast cancer with positive lymph nodes for metastatic disease  We will now proceed to the OR for a complete right axillary lymph node dissection.  We discussed the risks which include but are not limited to bleeding, infection, injury to surrounding structures, the need for a drain post op, the need for further procedures, arm swelling, cardiopulmonary issues, etc.  She agrees to proceed with surgery  [name], MD 03/27/2021,  12:22 PM

## 2021-03-28 ENCOUNTER — Other Ambulatory Visit (HOSPITAL_COMMUNITY): Payer: Self-pay

## 2021-03-28 ENCOUNTER — Encounter (HOSPITAL_BASED_OUTPATIENT_CLINIC_OR_DEPARTMENT_OTHER)
Admission: RE | Disposition: A | Discharge status: Home / Self Care | Payer: Self-pay | Source: Home / Self Care | Attending: Surgery

## 2021-03-28 ENCOUNTER — Other Ambulatory Visit: Payer: Self-pay

## 2021-03-28 ENCOUNTER — Ambulatory Visit (HOSPITAL_BASED_OUTPATIENT_CLINIC_OR_DEPARTMENT_OTHER): Payer: Medicaid Other | Admitting: Anesthesiology

## 2021-03-28 ENCOUNTER — Encounter (HOSPITAL_BASED_OUTPATIENT_CLINIC_OR_DEPARTMENT_OTHER): Payer: Self-pay | Admitting: Surgery

## 2021-03-28 ENCOUNTER — Ambulatory Visit (HOSPITAL_COMMUNITY)
Admission: RE | Admit: 2021-03-28 | Discharge: 2021-03-28 | Disposition: A | Discharge status: Home / Self Care | Payer: Medicaid Other | Attending: Surgery | Admitting: Surgery

## 2021-03-28 ENCOUNTER — Other Ambulatory Visit: Payer: Self-pay | Admitting: Surgery

## 2021-03-28 DIAGNOSIS — F1290 Cannabis use, unspecified, uncomplicated: Secondary | ICD-10-CM | POA: Insufficient documentation

## 2021-03-28 DIAGNOSIS — Z87891 Personal history of nicotine dependence: Secondary | ICD-10-CM | POA: Diagnosis not present

## 2021-03-28 DIAGNOSIS — C50911 Malignant neoplasm of unspecified site of right female breast: Secondary | ICD-10-CM | POA: Insufficient documentation

## 2021-03-28 DIAGNOSIS — C773 Secondary and unspecified malignant neoplasm of axilla and upper limb lymph nodes: Secondary | ICD-10-CM | POA: Insufficient documentation

## 2021-03-28 DIAGNOSIS — Z881 Allergy status to other antibiotic agents status: Secondary | ICD-10-CM | POA: Diagnosis not present

## 2021-03-28 DIAGNOSIS — Z98891 History of uterine scar from previous surgery: Secondary | ICD-10-CM | POA: Insufficient documentation

## 2021-03-28 DIAGNOSIS — Z800 Family history of malignant neoplasm of digestive organs: Secondary | ICD-10-CM | POA: Insufficient documentation

## 2021-03-28 DIAGNOSIS — Z809 Family history of malignant neoplasm, unspecified: Secondary | ICD-10-CM | POA: Insufficient documentation

## 2021-03-28 DIAGNOSIS — Z91040 Latex allergy status: Secondary | ICD-10-CM | POA: Insufficient documentation

## 2021-03-28 HISTORY — PX: NODE DISSECTION: SHX5269

## 2021-03-28 LAB — POCT PREGNANCY, URINE: Preg Test, Ur: NEGATIVE

## 2021-03-28 SURGERY — NODE DISSECTION
Anesthesia: General | Site: Axilla | Laterality: Right

## 2021-03-28 MED ORDER — FENTANYL CITRATE (PF) 100 MCG/2ML IJ SOLN
INTRAMUSCULAR | Status: AC
  Filled 2021-03-28: qty 2

## 2021-03-28 MED ORDER — AMISULPRIDE (ANTIEMETIC) 5 MG/2ML IV SOLN
INTRAVENOUS | Status: AC
  Filled 2021-03-28: qty 4

## 2021-03-28 MED ORDER — GLYCOPYRROLATE 0.2 MG/ML IJ SOLN
INTRAMUSCULAR | Status: DC | PRN
  Administered 2021-03-28: .1 mg via INTRAVENOUS

## 2021-03-28 MED ORDER — ONDANSETRON HCL 4 MG/2ML IJ SOLN: 4.0000 mg | Freq: Once | INTRAMUSCULAR | Status: DC | PRN

## 2021-03-28 MED ORDER — OXYCODONE HCL 5 MG PO TABS
5.0000 mg | ORAL_TABLET | Freq: Once | ORAL | Status: AC | PRN
  Administered 2021-03-28: 5 mg via ORAL

## 2021-03-28 MED ORDER — CIPROFLOXACIN IN D5W 400 MG/200ML IV SOLN
INTRAVENOUS | Status: AC
  Filled 2021-03-28: qty 200

## 2021-03-28 MED ORDER — FENTANYL CITRATE (PF) 100 MCG/2ML IJ SOLN
INTRAMUSCULAR | Status: DC | PRN
  Administered 2021-03-28: 25 ug via INTRAVENOUS
  Administered 2021-03-28 (×2): 50 ug via INTRAVENOUS

## 2021-03-28 MED ORDER — DEXAMETHASONE SODIUM PHOSPHATE 10 MG/ML IJ SOLN
INTRAMUSCULAR | Status: AC
  Filled 2021-03-28: qty 1

## 2021-03-28 MED ORDER — GLYCOPYRROLATE PF 0.2 MG/ML IJ SOSY
PREFILLED_SYRINGE | INTRAMUSCULAR | Status: AC
  Filled 2021-03-28: qty 1

## 2021-03-28 MED ORDER — OXYCODONE HCL 5 MG/5ML PO SOLN: 5.0000 mg | Freq: Once | ORAL | Status: AC | PRN

## 2021-03-28 MED ORDER — MIDAZOLAM HCL 2 MG/2ML IJ SOLN
INTRAMUSCULAR | Status: AC
  Filled 2021-03-28: qty 2

## 2021-03-28 MED ORDER — 0.9 % SODIUM CHLORIDE (POUR BTL) OPTIME
TOPICAL | Status: DC | PRN
  Administered 2021-03-28: 1000 mL

## 2021-03-28 MED ORDER — OXYCODONE HCL 5 MG PO TABS: 5.0000 mg | ORAL_TABLET | Freq: Four times a day (QID) | ORAL | 0 refills | Status: DC | PRN

## 2021-03-28 MED ORDER — OXYCODONE HCL 5 MG PO TABS
5.0000 mg | ORAL_TABLET | Freq: Four times a day (QID) | ORAL | 0 refills | Status: DC | PRN
  Filled 2021-03-28: qty 30, 8d supply, fill #0

## 2021-03-28 MED ORDER — LACTATED RINGERS IV SOLN: INTRAVENOUS | Status: DC

## 2021-03-28 MED ORDER — AMISULPRIDE (ANTIEMETIC) 5 MG/2ML IV SOLN
10.0000 mg | Freq: Once | INTRAVENOUS | Status: AC | PRN
  Administered 2021-03-28: 10 mg via INTRAVENOUS

## 2021-03-28 MED ORDER — MIDAZOLAM HCL 5 MG/5ML IJ SOLN
INTRAMUSCULAR | Status: DC | PRN
  Administered 2021-03-28: 2 mg via INTRAVENOUS

## 2021-03-28 MED ORDER — DEXAMETHASONE SODIUM PHOSPHATE 4 MG/ML IJ SOLN
INTRAMUSCULAR | Status: DC | PRN
  Administered 2021-03-28: 5 mg via INTRAVENOUS

## 2021-03-28 MED ORDER — ONDANSETRON HCL 4 MG/2ML IJ SOLN
INTRAMUSCULAR | Status: DC | PRN
  Administered 2021-03-28: 4 mg via INTRAVENOUS

## 2021-03-28 MED ORDER — PROPOFOL 10 MG/ML IV BOLUS
INTRAVENOUS | Status: DC | PRN
  Administered 2021-03-28: 200 mg via INTRAVENOUS

## 2021-03-28 MED ORDER — LIDOCAINE HCL (CARDIAC) PF 100 MG/5ML IV SOSY
PREFILLED_SYRINGE | INTRAVENOUS | Status: DC | PRN
  Administered 2021-03-28: 50 mg via INTRAVENOUS

## 2021-03-28 MED ORDER — FENTANYL CITRATE (PF) 100 MCG/2ML IJ SOLN
25.0000 ug | Freq: Once | INTRAMUSCULAR | Status: AC
Start: 2021-03-28 — End: 2021-03-28
  Administered 2021-03-28: 50 ug via INTRAVENOUS

## 2021-03-28 MED ORDER — OXYCODONE HCL 5 MG PO TABS
ORAL_TABLET | ORAL | Status: AC
  Filled 2021-03-28: qty 1

## 2021-03-28 MED ORDER — PROPOFOL 10 MG/ML IV BOLUS
INTRAVENOUS | Status: AC
  Filled 2021-03-28: qty 20

## 2021-03-28 MED ORDER — ONDANSETRON HCL 4 MG/2ML IJ SOLN
INTRAMUSCULAR | Status: AC
  Filled 2021-03-28: qty 2

## 2021-03-28 MED ORDER — FENTANYL CITRATE (PF) 100 MCG/2ML IJ SOLN
25.0000 ug | INTRAMUSCULAR | Status: DC | PRN
  Administered 2021-03-28: 50 ug via INTRAVENOUS

## 2021-03-28 MED ORDER — BUPIVACAINE-EPINEPHRINE 0.5% -1:200000 IJ SOLN
INTRAMUSCULAR | Status: DC | PRN
  Administered 2021-03-28: 20 mL

## 2021-03-28 MED ORDER — LIDOCAINE HCL (PF) 2 % IJ SOLN
INTRAMUSCULAR | Status: AC
  Filled 2021-03-28: qty 5

## 2021-03-28 SURGICAL SUPPLY — 47 items
ADH SKN CLS APL DERMABOND .7 (GAUZE/BANDAGES/DRESSINGS) ×2
APL PRP STRL LF DISP 70% ISPRP (MISCELLANEOUS) ×1
APPLIER CLIP 9.375 MED OPEN (MISCELLANEOUS) ×2
APR CLP MED 9.3 20 MLT OPN (MISCELLANEOUS) ×1
BIOPATCH RED 1 DISK 7.0 (GAUZE/BANDAGES/DRESSINGS) ×2 IMPLANT
BLADE SURG 15 STRL LF DISP TIS (BLADE) ×1 IMPLANT
BLADE SURG 15 STRL SS (BLADE) ×2
CANISTER SUCT 1200ML W/VALVE (MISCELLANEOUS) ×2 IMPLANT
CHLORAPREP W/TINT 26 (MISCELLANEOUS) ×2 IMPLANT
CLIP APPLIE 9.375 MED OPEN (MISCELLANEOUS) ×1 IMPLANT
COVER BACK TABLE 60X90IN (DRAPES) ×2 IMPLANT
COVER MAYO STAND STRL (DRAPES) ×2 IMPLANT
DERMABOND ADVANCED (GAUZE/BANDAGES/DRESSINGS) ×2
DERMABOND ADVANCED .7 DNX12 (GAUZE/BANDAGES/DRESSINGS) ×2 IMPLANT
DRAIN CHANNEL 19F RND (DRAIN) ×2 IMPLANT
DRAPE LAPAROTOMY 100X72 PEDS (DRAPES) ×2 IMPLANT
DRAPE UTILITY XL STRL (DRAPES) ×2 IMPLANT
DRSG TEGADERM 4X4.75 (GAUZE/BANDAGES/DRESSINGS) ×2 IMPLANT
ELECT BLADE 4.0 EZ CLEAN MEGAD (MISCELLANEOUS) ×2
ELECT REM PT RETURN 9FT ADLT (ELECTROSURGICAL) ×2
ELECTRODE BLDE 4.0 EZ CLN MEGD (MISCELLANEOUS) ×1 IMPLANT
ELECTRODE REM PT RTRN 9FT ADLT (ELECTROSURGICAL) ×1 IMPLANT
EVACUATOR SILICONE 100CC (DRAIN) ×2 IMPLANT
GLOVE SURG POLYISO LF SZ6.5 (GLOVE) ×4 IMPLANT
GLOVE SURG POLYISO LF SZ7.5 (GLOVE) ×2 IMPLANT
GLOVE SURG UNDER POLY LF SZ7 (GLOVE) ×4 IMPLANT
GOWN STRL REUS W/ TWL LRG LVL3 (GOWN DISPOSABLE) ×2 IMPLANT
GOWN STRL REUS W/ TWL XL LVL3 (GOWN DISPOSABLE) ×1 IMPLANT
GOWN STRL REUS W/TWL LRG LVL3 (GOWN DISPOSABLE) ×4
GOWN STRL REUS W/TWL XL LVL3 (GOWN DISPOSABLE) ×2
NEEDLE HYPO 25X1 1.5 SAFETY (NEEDLE) ×2 IMPLANT
NS IRRIG 1000ML POUR BTL (IV SOLUTION) ×2 IMPLANT
PACK BASIN DAY SURGERY FS (CUSTOM PROCEDURE TRAY) ×2 IMPLANT
PENCIL SMOKE EVACUATOR (MISCELLANEOUS) ×2 IMPLANT
PIN SAFETY STERILE (MISCELLANEOUS) ×2 IMPLANT
SLEEVE SCD COMPRESS KNEE MED (STOCKING) ×2 IMPLANT
SPONGE T-LAP 4X18 ~~LOC~~+RFID (SPONGE) ×2 IMPLANT
SUT ETHILON 2 0 FS 18 (SUTURE) ×2 IMPLANT
SUT MNCRL AB 4-0 PS2 18 (SUTURE) ×2 IMPLANT
SUT VIC AB 2-0 SH 27 (SUTURE) ×2
SUT VIC AB 2-0 SH 27XBRD (SUTURE) ×1 IMPLANT
SUT VIC AB 3-0 SH 27 (SUTURE) ×2
SUT VIC AB 3-0 SH 27X BRD (SUTURE) ×1 IMPLANT
SYR CONTROL 10ML LL (SYRINGE) ×2 IMPLANT
TOWEL GREEN STERILE FF (TOWEL DISPOSABLE) ×2 IMPLANT
TUBE CONNECTING 20X1/4 (TUBING) ×2 IMPLANT
YANKAUER SUCT BULB TIP NO VENT (SUCTIONS) ×2 IMPLANT

## 2021-03-28 NOTE — Transfer of Care (Signed)
Immediate Anesthesia Transfer of Care Note  Patient: Venna Berberich  Procedure(s) Performed: RIGHT AXILLARY LYMPH NODE DISSECTION (Right: Axilla)  Patient Location: PACU  Anesthesia Type:General  Level of Consciousness: awake, alert  and oriented  Airway & Oxygen Therapy: Patient Spontanous Breathing and Patient connected to nasal cannula oxygen  Post-op Assessment: Report given to RN and Post -op Vital signs reviewed and stable  Post vital signs: Reviewed and stable  Last Vitals:  Vitals Value Taken Time  BP    Temp    Pulse    Resp    SpO2      Last Pain:  Vitals:   03/28/21 0636  TempSrc: Oral  PainSc: 8          Complications: No notable events documented.

## 2021-03-28 NOTE — Discharge Instructions (Addendum)
You may shower daily.  Cover the drain site with a dry gauze  Dump the fluid from the drain bulb twice daily and record the output.  You may use Tylenol, ibuprofen, and an ice pack also for pain  No vigorous activity until we remove the drain.  Call our office at [phone] and make an appointment to see me on July 26   Post Anesthesia Home Care Instructions  Activity: Get plenty of rest for the remainder of the day. A responsible individual must stay with you for 24 hours following the procedure.  For the next 24 hours, DO NOT: -Drive a car -Paediatric nurse -Drink alcoholic beverages -Take any medication unless instructed by your physician -Make any legal decisions or sign important papers.  Meals: Start with liquid foods such as gelatin or soup. Progress to regular foods as tolerated. Avoid greasy, spicy, heavy foods. If nausea and/or vomiting occur, drink only clear liquids until the nausea and/or vomiting subsides. Call your physician if vomiting continues.  Special Instructions/Symptoms: Your throat may feel dry or sore from the anesthesia or the breathing tube placed in your throat during surgery. If this causes discomfort, gargle with warm salt water. The discomfort should disappear within 24 hours.  If you had a scopolamine patch placed behind your ear for the management of post- operative nausea and/or vomiting:  1. The medication in the patch is effective for 72 hours, after which it should be removed.  Wrap patch in a tissue and discard in the trash. Wash hands thoroughly with soap and water. 2. You may remove the patch earlier than 72 hours if you experience unpleasant side effects which may include dry mouth, dizziness or visual disturbances. 3. Avoid touching the patch. Wash your hands with soap and water after contact with the patch.    About my Jackson-Pratt Bulb Drain  What is a Jackson-Pratt bulb? A Jackson-Pratt is a soft, round device used to collect  drainage. It is connected to a long, thin drainage catheter, which is held in place by one or two small stiches near your surgical incision site. When the bulb is squeezed, it forms a vacuum, forcing the drainage to empty into the bulb.  Emptying the Jackson-Pratt bulb- To empty the bulb: 1. Release the plug on the top of the bulb. 2. Pour the bulb's contents into a measuring container which your nurse will provide. 3. Record the time emptied and amount of drainage. Empty the drain(s) as often as your     doctor or nurse recommends.  Date                  Time                    Amount (Drain 1)                 Amount (Drain 2)  _____________________________________________________________________  _____________________________________________________________________  _____________________________________________________________________  _____________________________________________________________________  _____________________________________________________________________  _____________________________________________________________________  _____________________________________________________________________  _____________________________________________________________________  Squeezing the Jackson-Pratt Bulb- To squeeze the bulb: 1. Make sure the plug at the top of the bulb is open. 2. Squeeze the bulb tightly in your fist. You will hear air squeezing from the bulb. 3. Replace the plug while the bulb is squeezed. 4. Use a safety pin to attach the bulb to your clothing. This will keep the catheter from     pulling at the bulb insertion site.  When to call your doctor- Call your doctor if: Drain site becomes red,  swollen or hot. You have a fever greater than 101 degrees F. There is oozing at the drain site. Drain falls out (apply a guaze bandage over the drain hole and secure it with tape). Drainage increases daily not related to activity patterns. (You will usually  have more drainage when you are active than when you are resting.) Drainage has a bad odor.   Oxycodone given at 9:42am today 03/28/21

## 2021-03-28 NOTE — Anesthesia Postprocedure Evaluation (Signed)
Anesthesia Post Note  Patient: Akiyah Eppolito  Procedure(s) Performed: RIGHT AXILLARY LYMPH NODE DISSECTION (Right: Axilla)     Patient location during evaluation: PACU Anesthesia Type: General Level of consciousness: awake and alert Pain management: pain level controlled Vital Signs Assessment: post-procedure vital signs reviewed and stable Respiratory status: spontaneous breathing, nonlabored ventilation and respiratory function stable Cardiovascular status: blood pressure returned to baseline and stable Postop Assessment: no apparent nausea or vomiting Anesthetic complications: no   No notable events documented.  Last Vitals:  Vitals:   03/28/21 0945 03/28/21 1007  BP:  (!) 143/74  Pulse:  82  Resp: 18 16  Temp:  36.7 C  SpO2: 95% 96%    Last Pain:  Vitals:   03/28/21 1007  TempSrc: Oral  PainSc: [place]

## 2021-03-28 NOTE — Op Note (Signed)
RIGHT AXILLARY LYMPH NODE DISSECTION  Procedure Note  Melody Rodriguez 03/28/2021   Pre-op Diagnosis: RIGHT BREAST CANCER POSITIVE NODES     Post-op Diagnosis: same  Procedure(s): RIGHT AXILLARY LYMPH NODE DISSECTION  Surgeon(s): [name], MD Carlena Hurl, PA-C  Anesthesia: General  Staff:  Circulator: Izora Ribas, RN Scrub Person: Lorenza Burton, CST  Estimated Blood Loss: Minimal               Specimens: sent to path  Indications: This is a 45 year old female who originally presented with breast cancer and a biopsy positive lymph node.  She underwent neoadjuvant therapy.  She then underwent a right breast lumpectomy and targeted lymph node dissection.  Only 1 lymph node was removed containing the targeted seed and the still showed metastatic cancer so decision was made to proceed to the operating room for complete axillary dissection.  After her original surgery, she developed a hematoma in the axilla which had to be drained 1 month ago.  Procedure: The patient is brought to operating identifies correct patient.  She is placed upon the operating table and general anesthesia was induced.  Her right axilla was then prepped and draped in usual sterile fashion.  I anesthetized skin around the previous scar with Marcaine and then made elliptical incision with a scalpel and remove the old scar with the cautery.  I then dissected down into the deep axillary tissue.  The patient had a minimal seroma but still had moderate scarring from her previous surgery.  I was able to dissect into the deep axillary tissue and identify the axillary vein.  A superficial vein was clipped with surgical clips.  The thoracodorsal bundle was identified and care was taken to not disrupt the nerve.  Secondary to the scarring, it was difficult to feel enlarged lymph nodes in the axillary tissue.  With the aid of the cautery and surgical clips of able to complete the axillary dissection removing the axillary  fat and nodal package.  This was then sent to pathology for evaluation.  I then irrigated the wound with a liter of normal saline.  Again, hemostasis appeared to be achieved.  I made a separate skin incision and placed a 19 [place] Blake drain into the incision which I then secured in place with a 2-0 nylon suture.  The deep tissue was then closed interrupted 2-0 Vicryl sutures.  Subtenons tissue was closed interrupted 3-0 Vicryl sutures and skin was closed with a running 4-0 Monocryl.  Dermabond was then applied.  The patient tolerated the procedure well.  All the counts were correct at the end of the procedure.  The patient was then extubated in the operating room and taken in a stable condition to the recovery room.          [name]   Date: 03/28/2021  Time: 8:18 AM

## 2021-03-28 NOTE — Anesthesia Procedure Notes (Signed)
Procedure Name: LMA Insertion Date/Time: 03/28/2021 7:31 AM Performed by: Bufford Spikes, CRNA Pre-anesthesia Checklist: Patient identified, Emergency Drugs available, Suction available and Patient being monitored Patient Re-evaluated:Patient Re-evaluated prior to induction Oxygen Delivery Method: Circle system utilized Preoxygenation: Pre-oxygenation with 100% oxygen Induction Type: IV induction Ventilation: Mask ventilation without difficulty LMA: LMA inserted LMA Size: 4.0 Number of attempts: 1 Placement Confirmation: positive ETCO2 Tube secured with: Tape Dental Injury: Teeth and Oropharynx as per pre-operative assessment

## 2021-03-28 NOTE — Anesthesia Preprocedure Evaluation (Signed)
Anesthesia Evaluation  Patient identified by MRN, date of birth, ID band Patient awake    Reviewed: Allergy & Precautions, NPO status , Patient's Chart, lab work & pertinent test results  History of Anesthesia Complications Negative for: history of anesthetic complications  Airway Mallampati: II  TM Distance: >3 FB Neck ROM: Full    Dental  (+) Dental Advisory Given   Pulmonary neg pulmonary ROS, Patient abstained from smoking., former smoker,    Pulmonary exam normal        Cardiovascular negative cardio ROS Normal cardiovascular exam     Neuro/Psych negative neurological ROS     GI/Hepatic negative GI ROS, Neg liver ROS,   Endo/Other  negative endocrine ROS  Renal/GU negative Renal ROS  negative genitourinary   Musculoskeletal negative musculoskeletal ROS (+)   Abdominal   Peds  Hematology negative hematology ROS (+)   Anesthesia Other Findings  Breast cancer  Normal echo 08/27/20  Reproductive/Obstetrics                             Anesthesia Physical  Anesthesia Plan  ASA: 2  Anesthesia Plan: General   Post-op Pain Management:    Induction: Intravenous  PONV Risk Score and Plan: 3 and Ondansetron, Dexamethasone, Midazolam and Treatment may vary due to age or medical condition  Airway Management Planned: LMA  Additional Equipment: None  Intra-op Plan:   Post-operative Plan: Extubation in OR  Informed Consent: I have reviewed the patients History and Physical, chart, labs and discussed the procedure including the risks, benefits and alternatives for the proposed anesthesia with the patient or authorized representative who has indicated his/her understanding and acceptance.     Dental advisory given  Plan Discussed with:   Anesthesia Plan Comments:         Anesthesia Quick Evaluation

## 2021-03-28 NOTE — Interval H&P Note (Signed)
History and Physical Interval Note:no change in H and P  03/28/2021 7:05 AM  Melody Rodriguez  has presented today for surgery, with the diagnosis of RIGHT BREAST CANCER POSITIVE NODES.  The various methods of treatment have been discussed with the patient and family. After consideration of risks, benefits and other options for treatment, the patient has consented to  Procedure(s): RIGHT AXILLARY LYMPH NODE DISSECTION (Right) as a surgical intervention.  The patient's history has been reviewed, patient examined, no change in status, stable for surgery.  I have reviewed the patient's chart and labs.  Questions were answered to the patient's satisfaction.     [name]

## 2021-03-29 LAB — SURGICAL PATHOLOGY

## 2021-03-31 ENCOUNTER — Encounter: Payer: Self-pay | Admitting: *Deleted

## 2021-04-01 ENCOUNTER — Encounter (HOSPITAL_BASED_OUTPATIENT_CLINIC_OR_DEPARTMENT_OTHER): Payer: Self-pay | Admitting: Surgery

## 2021-04-08 ENCOUNTER — Encounter: Payer: Self-pay | Admitting: Hematology and Oncology

## 2021-04-18 ENCOUNTER — Other Ambulatory Visit: Payer: Self-pay

## 2021-04-18 ENCOUNTER — Ambulatory Visit: Payer: Medicaid Other | Attending: Surgery | Admitting: Physical Therapy

## 2021-04-18 ENCOUNTER — Encounter: Payer: Self-pay | Admitting: Physical Therapy

## 2021-04-18 DIAGNOSIS — C50411 Malignant neoplasm of upper-outer quadrant of right female breast: Secondary | ICD-10-CM | POA: Insufficient documentation

## 2021-04-18 DIAGNOSIS — M25611 Stiffness of right shoulder, not elsewhere classified: Secondary | ICD-10-CM | POA: Diagnosis present

## 2021-04-18 DIAGNOSIS — Z170 Estrogen receptor positive status [ER+]: Secondary | ICD-10-CM | POA: Insufficient documentation

## 2021-04-18 DIAGNOSIS — Z483 Aftercare following surgery for neoplasm: Secondary | ICD-10-CM | POA: Insufficient documentation

## 2021-04-18 DIAGNOSIS — M25511 Pain in right shoulder: Secondary | ICD-10-CM | POA: Diagnosis present

## 2021-04-18 DIAGNOSIS — R293 Abnormal posture: Secondary | ICD-10-CM | POA: Insufficient documentation

## 2021-04-18 NOTE — Progress Notes (Signed)
Patient Care Team: Patient, [name] as PCP - General (General Practice) Melody Germany, RN as Oncology Nurse Navigator Melody Kaufmann, RN as Oncology Nurse Navigator Melody Keens, MD as Consulting Physician (General Surgery) Melody Lose, MD as Consulting Physician (Hematology and Oncology) Melody Pray, MD as Consulting Physician (Radiation Oncology)  DIAGNOSIS:    ICD-10-CM   1. Malignant neoplasm of upper-outer quadrant of right breast in female, estrogen receptor positive ([place])  C50.411    Z17.0       SUMMARY OF ONCOLOGIC HISTORY: Oncology History  Malignant neoplasm of upper-outer quadrant of right breast in female, estrogen receptor positive ([place])  07/28/2020 Initial Diagnosis   Patient palpated a right breast mass for 1-2 years. Mammogram showed a 2.2cm mass at the 11 o'clock position with surrounding calcifications, 6.4cm in total extent, and up to 5 abnormal right axillary lymph nodes. 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    08/05/2020 Miscellaneous   MammaPrint: High risk luminal type B   08/11/2020 Genetic Testing   Negative genetic testing: no pathogenic variants detected in [facility] Breast Cancer STAT Panel or Common Hereditary Cancers panel. The report dates are August 11, 2020 and August 19, 2020, respectively. Two variants of uncertain signficance were detected - one in the CTNNA1 gene called c.86del and the second in the MLH1 gene called c.808A>G.   UPDATE:  The MLH1 c.808A>G VUS was reclassified to "Likely Benign" on 02/19/2021. The change in variant classification was made as a result of re-review of the evidence in light of new variant interpretation guidelines and/or new information.   The STAT Breast cancer panel offered by [facility] includes sequencing and rearrangement analysis for the following 9  genes:  ATM, BRCA1, BRCA2, CDH1, CHEK2, PALB2, PTEN, STK11 and TP53.  The Common Hereditary Cancers Panel offered by [facility] includes sequencing and/or deletion duplication testing of the following 48 genes: APC, ATM, AXIN2, BARD1, BMPR1A, BRCA1, BRCA2, BRIP1, CDH1, CDK4, CDKN2A (p14ARF), CDKN2A (p16INK4a), CHEK2, CTNNA1, DICER1, EPCAM (Deletion/duplication testing only), GREM1 (promoter region deletion/duplication testing only), KIT, MEN1, MLH1, MSH2, MSH3, MSH6, MUTYH, NBN, NF1, NTHL1, PALB2, PDGFRA, PMS2, POLD1, POLE, PTEN, RAD50, RAD51C, RAD51D, RNF43, SDHB, SDHC, SDHD, SMAD4, SMARCA4. STK11, TP53, TSC1, TSC2, and VHL.  The following genes were evaluated for sequence changes only: SDHA and HOXB13 c.251G>A variant only.    09/01/2020 -  Chemotherapy    Patient is on Treatment Plan: BREAST DOSE DENSE AC Q14D / PACLITAXEL D1,8,15 Q21D (CARBO REMOVED BY Melody Rodriguez)       02/07/2021 Surgery   Right lumpectomy Melody Rodriguez): invasive and in situ ductal carcinoma, 2.8cm, clear margins, with metastatic carcinoma in 1/1 right axillary lymph nodes.     CHIEF COMPLIANT:  Follow-up of right breast cancer  INTERVAL HISTORY: Melody Rodriguez is a 45 y.o. with above-mentioned history of right breast who completed neoadjuvant chemotherapy and lumpectomy. She presents to the clinic today for follow-up.  Since her axillary lymph node dissection she is having throbbing pain underneath the right arm.  She has experienced some boils in the groin area and she wanted to be checked out today.  She reports that the right axillary drain fell off.  ALLERGIES:  is allergic to cephalexin and latex.  MEDICATIONS:  Current Outpatient Medications  Medication Sig Dispense Refill   cyclobenzaprine (FLEXERIL) 5 MG tablet TAKE 1 TABLET  BY MOUTH 3 TIMES DAILY AS NEEDED FOR MUSCLE SPASMS. 20 tablet 0   loratadine (CLARITIN) 10 MG tablet Take 10 mg by mouth daily.     oxyCODONE (OXY IR/ROXICODONE) 5 MG immediate release tablet Take  1-2 tablets (5-10 mg total) by mouth every 6 (six) hours as needed for moderate pain, severe pain or breakthrough pain. 30 tablet 0   oxyCODONE (OXY IR/ROXICODONE) 5 MG immediate release tablet Take 1 tablet (5 mg total) by mouth every 6 (six) hours as needed for moderate or severe pain. 30 tablet 0   venlafaxine XR (EFFEXOR-XR) 37.5 MG 24 hr capsule TAKE 1 CAPSULE BY MOUTH DAILY WITH BREAKFAST 30 capsule 6   No current facility-administered medications for this visit.    PHYSICAL EXAMINATION: ECOG PERFORMANCE STATUS: 1 - Symptomatic but completely ambulatory  Vitals:   04/19/21 1118  BP: 117/71  Pulse: 89  Resp: 18  Temp: 97.7 F (36.5 C)  SpO2: 98%   Filed Weights   04/19/21 1118  Weight: 213 lb 6.4 oz (96.8 kg)       LABORATORY DATA:  I have reviewed the data as listed CMP Latest Ref Rng & Units 02/03/2021 01/11/2021 01/04/2021  Glucose 70 - 99 mg/dL 197(H) 211(H) 180(H)  BUN 6 - 20 mg/dL 12 12 11  Creatinine 0.44 - 1.00 mg/dL 0.56 0.69 0.68  Sodium 135 - 145 mmol/L 135 140 142  Potassium 3.5 - 5.1 mmol/L 4.2 4.0 3.8  Chloride 98 - 111 mmol/L 101 108 108  CO2 22 - 32 mmol/L 22 21(L) 24  Calcium 8.9 - 10.3 mg/dL 9.7 9.0 8.7(L)  Total Protein 6.5 - 8.1 g/dL - 7.3 6.6  Total Bilirubin 0.3 - 1.2 mg/dL - 0.2(L) <0.2(L)  Alkaline Phos 38 - 126 U/L - 56 52  AST 15 - 41 U/L - 22 15  ALT 0 - 44 U/L - 29 18    Lab Results  Component Value Date   WBC 5.4 01/11/2021   HGB 12.6 01/11/2021   HCT 36.8 01/11/2021   MCV 96.1 01/11/2021   PLT 239 01/11/2021   NEUTROABS 3.5 01/11/2021    ASSESSMENT & PLAN:  Malignant neoplasm of upper-outer quadrant of right breast in female, estrogen receptor positive ([facility]) 07/28/2020:Patient palpated a right breast mass for 1-2 years. Mammogram showed a 2.2cm mass at the 11 o'clock position with surrounding calcifications, 6.4cm in total extent, and up to 5 abnormal right axillary lymph nodes. Biopsy showed invasive and in situ ductal carcinoma  in the breast and axilla, grade 2, HER-2 equivocal by IHC (2+), negative by FISH (ratio 1.6), ER+ 50% weak, PR+ 20%, Ki67 20%.   Treatment plan: 1. Neoadjuvant chemotherapy (MammaPrint test High Risk): AC foll by Taxol completed 01/11/21 2. 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 Adjuvant radiation therapy 4. Follow-up adjuvant antiestrogen therapy [facility] 16070: Treatment of refractory nausea 5. ALND 03/28/21: 3/7 LN positive -------------------------------------------------------------------------------------------------------------------------------  Boils in the perineum: There is a Bartholin cyst along the medial margin of the vulvar area.:  We will call patient's gynecologist and get her to see her soon. Radiation will start soon. Follow-up after radiation is complete.     No orders of the defined types were placed in this encounter.  The patient has a good understanding of the overall plan. she agrees with it. she will call with   any problems that may develop before the next visit here.  Total time spent: 20 mins including face to face time and time spent for planning, charting and coordination of care  Rulon Eisenmenger, MD, MPH 04/19/2021  I, Thana Ates, am acting as scribe for Dr. Nicholas Rodriguez.  I have reviewed the above documentation for accuracy and completeness, and I agree with the above.

## 2021-04-18 NOTE — Assessment & Plan Note (Signed)
07/28/2020:Patient palpated a right breast mass for 1-2 years. Mammogram showed a 2.2cm mass at the 11 o'clock position with surrounding calcifications, 6.4cm in total extent, and up to 5 abnormal right axillary lymph nodes. 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  Treatment plan: 1. Neoadjuvant chemotherapy (MammaPrint test High Risk): AC foll by Taxol completed 01/11/21 2. 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 Adjuvant radiation therapy 4. Follow-up adjuvant antiestrogen therapy [facility] 16070: Treatment of refractory nausea 5. ALND 03/28/21: 3/7 LN positive -------------------------------------------------------------------------------------------------------------------------------

## 2021-04-18 NOTE — Therapy (Signed)
[place], [place], [address] Phone: [phone]   Fax:  [phone]  Physical Therapy Treatment  Patient Details  Name: Melody Rodriguez MRN: [id number] Date of Birth: 25-Sep-1975 Referring Provider (PT): Dr. [name]   Encounter Date: 04/18/2021   PT End of Session - 04/18/21 1040     Visit Number 2    Number of Visits 10    Date for PT Re-Evaluation 05/16/21    PT Start Time 1000    PT Stop Time 1050    PT Time Calculation (min) 50 min    Activity Tolerance Patient tolerated treatment well    Behavior During Therapy [facility] for tasks assessed/performed             Past Medical History:  Diagnosis Date   Breast cancer [facility])    Family history of colon cancer     Past Surgical History:  Procedure Laterality Date   BREAST BIOPSY Right 02/18/2021   Procedure: EVACUATION HEMATOMA RIGHT AXILLA;  Surgeon: [name], MD;  Location: [place];  Service: General;  Laterality: Right;   BREAST CYST EXCISION Right    Patient does not recall (2014 or 2015)   BREAST LUMPECTOMY WITH RADIOACTIVE SEED AND SENTINEL LYMPH NODE BIOPSY Right 02/07/2021   Procedure: RIGHT BREAST LUMPECTOMY WITH RADIOACTIVE SEED AND SEED TARGETED LYMPH NODE BIOPSY AND SENTINEL LYMPH NODE BIOPSY;  Surgeon: [name], MD;  Location: [place];  Service: General;  Laterality: Right;   CESAREAN SECTION     IR IMAGING GUIDED PORT INSERTION  09/15/2020   IR REMOVAL TUN CV CATH W/O FL  09/15/2020   NODE DISSECTION Right 03/28/2021   Procedure: RIGHT AXILLARY LYMPH NODE DISSECTION;  Surgeon: [name], MD;  Location: [place];  Service: General;  Laterality: Right;   PORT-A-CATH REMOVAL Left 02/07/2021   Procedure: REMOVAL PORT-A-CATH;  Surgeon: [name], MD;  Location: [place];  Service: General;  Laterality: Left;   THYROIDECTOMY, PARTIAL       There were no vitals filed for this visit.   Subjective Assessment - 04/18/21 1005     Subjective Patient underwent neoadjuvant chemotherapy from 09/01/2020 - 01/14/2021. She had a right lumpectomy and sentinel node biopsy (1/1 nodes positive) on 02/07/2021. Hematoma evacuation on 02/18/2021. Axillary dissection on 03/28/2021 with 3/7 positive nodes. She will start radiation 05/02/2021.    Pertinent History Patient was diagnosed on 07/13/2020 with right grade 2 invasive ductal carcinoma breast cancer. It is ER/PR positive and HER2 negative with a Ki67 of 20%. Patient underwent neoadjuvant chemotherapy from 09/01/2020 - 01/14/2021. She had a right lumpectomy and sentinel node biopsy (1/1 nodes positive) on 02/07/2021. Hematoma evacuation on 02/18/2021. Axillary dissection on 03/28/2021 with 3/7 positive nodes. Iron deficiency.    Patient Stated Goals Get my arm better    Currently in Pain? Yes    Pain Score 8     Pain Location Neck    Pain Orientation Right    Pain Descriptors / Indicators Other (Comment);Spasm   Muscular   Pain Type Acute pain    Pain Onset 1 to 4 weeks ago    Pain Frequency Intermittent    Aggravating Factors  Turning head    Pain Relieving Factors Nothing    Multiple Pain Sites No                [facility] PT Assessment - 04/18/21 0001  Assessment   Medical Diagnosis s/p right lumpectomy and ALND    Referring Provider (PT) Dr. [name]    Onset Date/Surgical Date 02/07/21   And ALND on 7/11/02022   Hand Dominance Right    Prior Therapy Baselines      Precautions   Precautions Other (comment)    Precaution Comments recent surgery; right arm lymphedema risk      Restrictions   Weight Bearing Restrictions No      Balance Screen   Has the patient fallen in the past 6 months No    Has the patient had a decrease in activity level because of a fear of falling?  No    Is the patient reluctant to leave their home because of a fear of falling?  No      Home  Environment   Living Environment Private residence    Living Arrangements Children   18 y.o. 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 Right drain site covered with a bandage but patient reports it is not red or open. Scar tissue present at both incisions with thickness present.      Posture/Postural Control   Posture/Postural Control Postural limitations    Postural Limitations Rounded Shoulders;Forward head      ROM / Strength   AROM / PROM / Strength AROM      AROM   AROM Assessment Site Shoulder    Right/Left Shoulder Right    Right Shoulder Extension 27 Degrees    Right Shoulder Flexion 78 Degrees    Right Shoulder ABduction 80 Degrees               LYMPHEDEMA/ONCOLOGY QUESTIONNAIRE - 04/18/21 0001       Type   Cancer Type Right breast cancer      Surgeries   Lumpectomy Date 02/07/21    Sentinel Lymph Node Biopsy Date 02/07/21    Axillary Lymph Node Dissection Date 03/28/21    Number Lymph Nodes Removed 8      Treatment   Active Chemotherapy Treatment No    Past Chemotherapy Treatment Yes    Date 01/14/21    Active Radiation Treatment No    Past Radiation Treatment No    Current Hormone Treatment No    Past Hormone Therapy No      What other symptoms do you have   Are you Having Heaviness or Tightness Yes    Are you having Pain Yes    Are you having pitting edema No    Is it Hard or Difficult finding clothes that fit No    Do you have infections No    Is there Decreased scar mobility Yes    Stemmer Sign No      Right Upper Extremity Lymphedema   10 cm Proximal to  Olecranon Process 34.8 cm    Olecranon Process 27.6 cm    10 cm Proximal to Ulnar Styloid Process 24.2 cm    Just Proximal to Ulnar Styloid Process 16.8 cm    Across Hand at Thumb  Web Space 20.4 cm    At [place] of 2nd Digit 6.6 cm      Left Upper Extremity Lymphedema   10 cm Proximal to Olecranon Process 34.5 cm    Olecranon Process 27 cm    10 cm Proximal to Ulnar Styloid Process 23.2 cm    Just Proximal to Ulnar Styloid Process 16.8 cm    Across Hand at [employer] 20.1 cm    At [place] of 2nd Digit 6.5 cm                Quick Dash - 04/18/21 0001     Open a tight or new jar Moderate difficulty    Do heavy household chores (wash walls, wash floors) Unable    Carry a shopping bag or briefcase Moderate difficulty    Wash your back Unable    Use a knife to cut food Mild difficulty    Recreational activities in which you take some force or impact through your arm, shoulder, or hand (golf, hammering, tennis) Unable    During the past week, to what extent has your arm, shoulder or hand problem interfered with your normal social activities with family, friends, neighbors, or groups? Quite a bit    During the past week, to what extent has your arm, shoulder or hand problem limited your work or other regular daily activities Quite a bit    Arm, shoulder, or hand pain. Severe    Tingling (pins and needles) in your arm, shoulder, or hand Severe    Difficulty Sleeping Severe difficulty    DASH Score 72.73 %                            PT Education - 04/18/21 1037     Education Details Aftercare; where to be fitted for compression bra; issued script for a bra; scar massage    Person(s) Educated Patient    Methods Explanation;Demonstration;Handout    Comprehension Returned demonstration;Verbalized understanding                 PT Long Term Goals - 04/18/21 1046       PT LONG TERM GOAL #1   Title Patient will demonstrate she has regained full shoulder  ROM and function post operatively compared to baselines.    Baseline See eval for objectives    Time 4    Period Weeks    Status New    Target Date 05/16/21      PT LONG TERM GOAL #2   Title Patient will increase right shoulder active flexion to >/= 120 degrees for inceased ease reaching overhead.    Baseline 78 post op    Time 4    Period Weeks    Status New    Target Date 05/16/21      PT LONG TERM GOAL #3   Title Patient will increase her right shoulder active abduction to >/= 130 degrees to obtain radiation positioning.    Baseline 80 degrees    Time 4    Period Weeks    Status New    Target Date 05/16/21      PT LONG TERM GOAL #4   Title Patient will improve her DASH score to </= 53 for improved arm function.    Baseline 78    Time 4    Period Weeks    Status New    Target Date 05/16/21  PT LONG TERM GOAL #5   Title Patient will report she has a good understanding of lymphedema risk reduction practices.    Baseline No knowledge    Time 4    Period Weeks    Status New    Target Date 05/16/21                   Plan - 04/18/21 1041     Clinical Impression Statement Patient is having a hard time since her right lumpectomy 02/07/2021. She had 1/1 positive nodes initially and then an axillary lymph node dissection on 03/28/2021 with 3/7 positive nodes. She underwent neoadjuvant chemotherapy which was completed 01/14/2021. She is schedulde to begin radiation 05/02/2021. Her shoulder ROM is significantly limited and her pain is significant limiting her function. She will benefit from PT to regain full shoulder ROM and function and decrease pain.    PT Frequency 2x / week    PT Duration 4 weeks    PT Treatment/Interventions Therapeutic exercise;Patient/family education;Manual techniques;Manual lymph drainage;Scar mobilization;Passive range of motion;ADLs/Self Care Home Management    PT Next Visit Plan PROM and AAROM exercises    PT Home Exercise Plan Post op  shoulder ROM HEP    Consulted and Agree with Plan of Care Patient             Patient will benefit from skilled therapeutic intervention in order to improve the following deficits and impairments:  Postural dysfunction, Decreased range of motion, Pain, Impaired UE functional use, Decreased knowledge of precautions  Visit Diagnosis: Malignant neoplasm of upper-outer quadrant of right breast in female, estrogen receptor positive ([place]) - Plan: PT plan of care cert/re-cert  Abnormal posture - Plan: PT plan of care cert/re-cert  Acute pain of right shoulder - Plan: PT plan of care cert/re-cert  Stiffness of right shoulder, not elsewhere classified - Plan: PT plan of care cert/re-cert     Problem List Patient Active Problem List   Diagnosis Date Noted   Amenorrhea, secondary 03/02/2021   PICC (peripherally inserted central catheter) in place 09/09/2020   Genetic testing 08/11/2020   Iron deficiency anemia due to chronic blood loss 08/04/2020   Family history of colon cancer    Malignant neoplasm of upper-outer quadrant of right breast in female, estrogen receptor positive ([place]) 07/28/2020   Annia Friendly, PT 04/18/21 11:01 AM   [place], [place], [address] Phone: [phone]   Fax:  [phone]  Name: Melody Rodriguez MRN: [id number] Date of Birth: July 10, 1976

## 2021-04-18 NOTE — Patient Instructions (Addendum)
            [facility]         [address], [place] [address]         [phone]         Annia Friendly, PT, CLT   After Breast Cancer Class It is recommended you attend the ABC class to be educated on lymphedema risk reduction. This class is free of charge and lasts for 1 hour. It is a 1-time class.  You are scheduled for September 19th at 11:00. You need to download the Webex app and we will send you a link for the class.  Scar massage You can begin gentle scar massage using coconut oil on both incisions a few minutes each day like I showed you.  Compression garment We recommend a sports bra or compression bra to reduce your swelling.  Home exercise Program You need to begin doing the exercises to improve your range of motion so you can get positioned for radiation.  Follow up PT: It is recommended you return every 3 months for the first 3 years following surgery to be assessed on the SOZO machine for an L-Dex score. This helps prevent clinically significant lymphedema in 95% of patients. These follow up screens are 10 minute appointments that you are not billed for. The phone number is [phone]. You are scheduled for May 09, 2021 at 9:40 here at [address].

## 2021-04-19 ENCOUNTER — Encounter: Payer: Self-pay | Admitting: Licensed Clinical Social Worker

## 2021-04-19 ENCOUNTER — Inpatient Hospital Stay: Payer: Medicaid Other | Attending: Hematology and Oncology | Admitting: Hematology and Oncology

## 2021-04-19 ENCOUNTER — Telehealth: Payer: Self-pay

## 2021-04-19 DIAGNOSIS — Z170 Estrogen receptor positive status [ER+]: Secondary | ICD-10-CM | POA: Diagnosis not present

## 2021-04-19 DIAGNOSIS — C50411 Malignant neoplasm of upper-outer quadrant of right female breast: Secondary | ICD-10-CM | POA: Diagnosis not present

## 2021-04-19 DIAGNOSIS — C773 Secondary and unspecified malignant neoplasm of axilla and upper limb lymph nodes: Secondary | ICD-10-CM | POA: Diagnosis present

## 2021-04-19 DIAGNOSIS — Z923 Personal history of irradiation: Secondary | ICD-10-CM | POA: Diagnosis not present

## 2021-04-19 DIAGNOSIS — Z79899 Other long term (current) drug therapy: Secondary | ICD-10-CM | POA: Insufficient documentation

## 2021-04-19 DIAGNOSIS — Z9221 Personal history of antineoplastic chemotherapy: Secondary | ICD-10-CM | POA: Diagnosis not present

## 2021-04-19 NOTE — Telephone Encounter (Signed)
RN left voicemail on nurse triage line for Dr. Salli Quarry office regarding need for appointment due to bartholin cyst.

## 2021-04-19 NOTE — Progress Notes (Signed)
[place] CSW Progress Note  Holiday representative met with patient per pt request. Pt is recovering from multiple surgeries and still dealing with pain. She has not been able to work since May 23 and is filing for disability. She had forms today for physician to complete regarding recovery time.  Pt has been connected with a case manager through her insurance who is helping her connect with PCP, dentist, eye doctor.    CSW provided bags from food pantry and a gift card for gas today.    Christeen Douglas , LCSW

## 2021-04-20 ENCOUNTER — Ambulatory Visit: Payer: Medicaid Other

## 2021-04-20 ENCOUNTER — Other Ambulatory Visit: Payer: Self-pay

## 2021-04-20 DIAGNOSIS — Z170 Estrogen receptor positive status [ER+]: Secondary | ICD-10-CM

## 2021-04-20 DIAGNOSIS — M25511 Pain in right shoulder: Secondary | ICD-10-CM

## 2021-04-20 DIAGNOSIS — C50411 Malignant neoplasm of upper-outer quadrant of right female breast: Secondary | ICD-10-CM | POA: Diagnosis not present

## 2021-04-20 DIAGNOSIS — M25611 Stiffness of right shoulder, not elsewhere classified: Secondary | ICD-10-CM

## 2021-04-20 DIAGNOSIS — R293 Abnormal posture: Secondary | ICD-10-CM

## 2021-04-20 NOTE — Therapy (Signed)
[place], [place], [address] Phone: [phone]   Fax:  [phone]  Physical Therapy Treatment  Patient Details  Name: Melody Rodriguez MRN: [id number] Date of Birth: 1976-01-21 Referring Provider (PT): Dr. [name]   Encounter Date: 04/20/2021   PT End of Session - 04/20/21 1134     Visit Number 3    Number of Visits 10    Date for PT Re-Evaluation 05/16/21    Authorization Type [employer] - needs auth after 12 visits    Authorization - Visit Number 3    Authorization - Number of Visits 12    PT Start Time 1006    PT Stop Time 1102    PT Time Calculation (min) 56 min    Activity Tolerance Patient tolerated treatment well    Behavior During Therapy [facility] for tasks assessed/performed             Past Medical History:  Diagnosis Date   Breast cancer ([place])    Family history of colon cancer     Past Surgical History:  Procedure Laterality Date   BREAST BIOPSY Right 02/18/2021   Procedure: EVACUATION HEMATOMA RIGHT AXILLA;  Surgeon: [name], MD;  Location: [place];  Service: General;  Laterality: Right;   BREAST CYST EXCISION Right    Patient does not recall (2014 or 2015)   BREAST LUMPECTOMY WITH RADIOACTIVE SEED AND SENTINEL LYMPH NODE BIOPSY Right 02/07/2021   Procedure: RIGHT BREAST LUMPECTOMY WITH RADIOACTIVE SEED AND SEED TARGETED LYMPH NODE BIOPSY AND SENTINEL LYMPH NODE BIOPSY;  Surgeon: [name], MD;  Location: [place];  Service: General;  Laterality: Right;   CESAREAN SECTION     IR IMAGING GUIDED PORT INSERTION  09/15/2020   IR REMOVAL TUN CV CATH W/O FL  09/15/2020   NODE DISSECTION Right 03/28/2021   Procedure: RIGHT AXILLARY LYMPH NODE DISSECTION;  Surgeon: [name], MD;  Location: [place];  Service: General;  Laterality: Right;   PORT-A-CATH REMOVAL Left 02/07/2021   Procedure: REMOVAL  PORT-A-CATH;  Surgeon: [name], MD;  Location: [place];  Service: General;  Laterality: Left;   THYROIDECTOMY, PARTIAL      There were no vitals filed for this visit.   Subjective Assessment - 04/20/21 1012     Subjective I thought I had a boil pop up at my Rt groin and I told Dr. Lindi Adie at my appt yesterday (tried calling OB/GYN as [place] suggested but they wouldn't see me until Sept). His nurse looked at it and she said it's a cyst and they are going to contact my OB/GYN and see what they say should be done. My Rt shoulder feels like fire in my arm and my neck (points to upper trap area) is sore. Today I have an appt to be measured for a compression bra right after I leave here.    Pertinent History Patient was diagnosed on 07/13/2020 with right grade 2 invasive ductal carcinoma breast cancer. It is ER/PR positive and HER2 negative with a Ki67 of 20%. Patient underwent neoadjuvant chemotherapy from 09/01/2020 - 01/14/2021. She had a right lumpectomy and sentinel node biopsy (1/1 nodes positive) on 02/07/2021. Hematoma evacuation on 02/18/2021. Axillary dissection on 03/28/2021 with 3/7 positive nodes. Iron deficiency.    Patient Stated Goals Get my arm better    Currently in Pain? Yes    Pain Score 6     Pain Location Arm  Pain Orientation Right    Pain Descriptors / Indicators Other (Comment)   feels like fire in my arm   Pain Type Surgical pain    Pain Onset 1 to 4 weeks ago    Pain Frequency Constant    Aggravating Factors  overusing arm    Pain Relieving Factors it's always above a 5/10                               [facility] Adult PT Treatment/Exercise - 04/20/21 0001       Manual Therapy   Manual Therapy Myofascial release;Scapular mobilization;Manual Lymphatic Drainage (MLD);Passive ROM;Soft tissue mobilization    Soft tissue mobilization To Rt upper trap in supine, then in Lt S/L to Rt medial scapular border with cocoa butter for trigger  point release    Myofascial Release Gently to pts tolerance to Rt axilla, cording palpable here    Scapular Mobilization In Lt S/L for protraction and retraction; depression during sholder P/ROM    Manual Lymphatic Drainage (MLD) In Supine: Short neck, superficial and deep abdominals, Lt axillary nodes, Lt intact upper quadrant, and anterior inter-axillary anastomosis (avoided Rt inguinal nodes due to painful cyst here), then Rt chest wall and Rt upper arm redirecting towards anastomosis    Passive ROM In Supine to Rt shoulder into flexion and abduction to pts tolerance, slow mtion with long holds; VCs required throughout to relax due to muscle guarding                         PT Long Term Goals - 04/18/21 1046       PT LONG TERM GOAL #1   Title Patient will demonstrate she has regained full shoulder ROM and function post operatively compared to baselines.    Baseline See eval for objectives    Time 4    Period Weeks    Status New    Target Date 05/16/21      PT LONG TERM GOAL #2   Title Patient will increase right shoulder active flexion to >/= 120 degrees for inceased ease reaching overhead.    Baseline 78 post op    Time 4    Period Weeks    Status New    Target Date 05/16/21      PT LONG TERM GOAL #3   Title Patient will increase her right shoulder active abduction to >/= 130 degrees to obtain radiation positioning.    Baseline 80 degrees    Time 4    Period Weeks    Status New    Target Date 05/16/21      PT LONG TERM GOAL #4   Title Patient will improve her DASH score to </= 53 for improved arm function.    Baseline 78    Time 4    Period Weeks    Status New    Target Date 05/16/21      PT LONG TERM GOAL #5   Title Patient will report she has a good understanding of lymphedema risk reduction practices.    Baseline No knowledge    Time 4    Period Weeks    Status New    Target Date 05/16/21                   Plan - 04/20/21 1135      Clinical Impression Statement First session of manual therapy. Pt  is very tight and tender at Rt axilla and cording was noted near end of session once P/ROM had improved enough to palpate this. She required multiple VCs to relax during session due to muscle guarding, though was able to do with cuing. Also included scpaular mobs with STM to same area. Pts P/ROM was some improved by end of session and she reports feeling looser in Rt upper quadrant as well. Encouraged her to mindful of trying to decrease muscle guarding with ADLs and pt was able to verblaize good understanding of this.    Stability/Clinical Decision Making Stable/Uncomplicated    Rehab Potential Excellent    PT Frequency 2x / week    PT Duration 4 weeks    PT Treatment/Interventions Therapeutic exercise;Patient/family education;Manual techniques;Manual lymph drainage;Scar mobilization;Passive range of motion;ADLs/Self Care Home Management    PT Next Visit Plan Cont PROM of Rt shoulder and manual therapy to Rt upper quadrant; add AAROM exercises with pulleys and ball roll up wall    PT Home Exercise Plan Post op shoulder ROM HEP    Consulted and Agree with Plan of Care Patient             Patient will benefit from skilled therapeutic intervention in order to improve the following deficits and impairments:  Postural dysfunction, Decreased range of motion, Pain, Impaired UE functional use, Decreased knowledge of precautions  Visit Diagnosis: Malignant neoplasm of upper-outer quadrant of right breast in female, estrogen receptor positive ([facility])  Abnormal posture  Acute pain of right shoulder  Stiffness of right shoulder, not elsewhere classified     Problem List Patient Active Problem List   Diagnosis Date Noted   Amenorrhea, secondary 03/02/2021   PICC (peripherally inserted central catheter) in place 09/09/2020   Genetic testing 08/11/2020   Iron deficiency anemia due to chronic blood loss 08/04/2020   Family history  of colon cancer    Malignant neoplasm of upper-outer quadrant of right breast in female, estrogen receptor positive ([place]) 07/28/2020    Otelia Limes, PTA 04/20/2021, 11:44 AM  [place], [place], [address] Phone: [phone]   Fax:  [phone]  Name: Melody Rodriguez MRN: [id number] Date of Birth: Aug 04, 1976

## 2021-04-21 ENCOUNTER — Telehealth: Payer: Self-pay

## 2021-04-21 ENCOUNTER — Other Ambulatory Visit: Payer: Self-pay

## 2021-04-21 DIAGNOSIS — N750 Cyst of Bartholin's gland: Secondary | ICD-10-CM

## 2021-04-21 NOTE — Telephone Encounter (Signed)
Spoke with Dr. Marjory Lies office, they will be contacting patient to get appointment scheduled.

## 2021-04-21 NOTE — Progress Notes (Signed)
Patient called back to update that appointment has been scheduled with Dr. Rip Harbour.  Pt is inquiring to see if we can place referral to see if she can be evaluated at different location for sooner apt.   RN attempted several offices for referrals.  Clinics availability not until September, and some offices are not accepting insurance.   Call placed to [facility] at [phone].  Voicemail left for Mat Carne, new patient coordinator.  RN notified patient, patient was given contact information for follow up as well.

## 2021-04-21 NOTE — Telephone Encounter (Signed)
RN left voicemail on Dr. Salli Quarry nurse triage line requesting call back regarding cyst.

## 2021-04-22 ENCOUNTER — Telehealth: Payer: Self-pay

## 2021-04-22 ENCOUNTER — Telehealth: Payer: Self-pay | Admitting: *Deleted

## 2021-04-22 NOTE — Telephone Encounter (Signed)
Received a voicemail from 04/21/21 am stating she is a Marine scientist from [facility] named Kathlee Nations with Dr. Payton Mccallum. States she called to see if she can get this patient scheduled due to a cyst that is pretty aggravating. States it is making it hard for patient to walk. Wants to see if can get her seen by Dr. Rip Harbour.  Per chart is [place] patient, will route to that office. Toneka Fullen,RN

## 2021-04-22 NOTE — Progress Notes (Signed)
Location of Breast Cancer: upper-outer quadrant of right breast in female, estrogen receptor positive  Histology per Pathology Report:  07/23/2020  02/07/2021  03/28/2021   Receptor Status: HER-2 equivocal by IHC (2+), negative by FISH (ratio 1.6), ER+ 50% weak, PR+ 20%, Ki67 20%.  Did patient present with symptoms (if so, please note symptoms) or was this found on screening mammography?:  07/28/2020 Initial Diagnosis    Patient palpated a right breast mass for 1-2 years    Past/Anticipated interventions by surgeon, if any:  03/28/2021 Procedure(s): RIGHT AXILLARY LYMPH NODE DISSECTION   Surgeon(s): [name], MD  02/07/2021 Procedure(s): RADIOACTIVE SEED GUIDED RIGHT BREAST LUMPECTOMY TARGETED RIGHT AXILLARY LYMPH NODE DISSECTION REMOVAL PORT-A-CATH   Surgeon(s): [name], MD  Past/Anticipated interventions by medical oncology, if any: Dr Lindi Adie Chemotherapy      Patient is on Treatment Plan: BREAST DOSE DENSE AC Q14D / PACLITAXEL D1,8,15 Q21D (CARBO REMOVED BY DR. Lindi Adie)  Treatment plan: 1. Neoadjuvant chemotherapy (MammaPrint test High Risk): AC foll by Taxol completed 01/11/21 2. 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 Adjuvant radiation therapy 4. Follow-up adjuvant antiestrogen therapy [facility] 16070: Treatment of refractory nausea 5. ALND 03/28/21: 3/7 LN positive  Lymphedema issues, if any:  yes right arm   Pain issues, if any:  yes, right arm, right breast, right knee constant, sharp, and burning  SAFETY ISSUES: Prior radiation? no Pacemaker/ICD? no Possible current pregnancy? no Is the patient on methotrexate? no  Current Complaints / other details:  none    Vitals:   05/02/21 0837  BP: 134/75  Pulse: 84  Resp: 18  Temp: 97.9 F (36.6 C)  SpO2: 98%   Weight: 216 lb (98 kg)  Height: _0  (1.626 m)

## 2021-04-22 NOTE — Telephone Encounter (Signed)
Notified [facility] of completion of forms from [place] requesting additional information for processing Disability claim. Fax transmission confirmation received and copy mailed to [place] as requested.

## 2021-04-27 ENCOUNTER — Other Ambulatory Visit: Payer: Self-pay

## 2021-04-27 ENCOUNTER — Ambulatory Visit: Payer: Medicaid Other | Admitting: Rehabilitation

## 2021-04-27 ENCOUNTER — Encounter: Payer: Self-pay | Admitting: Rehabilitation

## 2021-04-27 DIAGNOSIS — R293 Abnormal posture: Secondary | ICD-10-CM

## 2021-04-27 DIAGNOSIS — C50411 Malignant neoplasm of upper-outer quadrant of right female breast: Secondary | ICD-10-CM | POA: Diagnosis not present

## 2021-04-27 DIAGNOSIS — M25611 Stiffness of right shoulder, not elsewhere classified: Secondary | ICD-10-CM

## 2021-04-27 DIAGNOSIS — M25511 Pain in right shoulder: Secondary | ICD-10-CM

## 2021-04-27 DIAGNOSIS — Z170 Estrogen receptor positive status [ER+]: Secondary | ICD-10-CM

## 2021-04-27 NOTE — Therapy (Signed)
[place], [place], [address] Phone: [phone]   Fax:  [phone]  Physical Therapy Treatment  Patient Details  Name: Melody Rodriguez MRN: [id number] Date of Birth: 10-13-1975 Referring Provider (PT): Dr. [name]   Encounter Date: 04/27/2021   PT End of Session - 04/27/21 1453     Visit Number 4    Number of Visits 10    Date for PT Re-Evaluation 05/16/21    Authorization - Visit Number 4    Authorization - Number of Visits 12    PT Start Time 1400    PT Stop Time 1452    PT Time Calculation (min) 52 min    Activity Tolerance Patient tolerated treatment well    Behavior During Therapy [facility] for tasks assessed/performed             Past Medical History:  Diagnosis Date   Breast cancer ([place])    Family history of colon cancer     Past Surgical History:  Procedure Laterality Date   BREAST BIOPSY Right 02/18/2021   Procedure: EVACUATION HEMATOMA RIGHT AXILLA;  Surgeon: [name], MD;  Location: [facility];  Service: General;  Laterality: Right;   BREAST CYST EXCISION Right    Patient does not recall (2014 or 2015)   BREAST LUMPECTOMY WITH RADIOACTIVE SEED AND SENTINEL LYMPH NODE BIOPSY Right 02/07/2021   Procedure: RIGHT BREAST LUMPECTOMY WITH RADIOACTIVE SEED AND SEED TARGETED LYMPH NODE BIOPSY AND SENTINEL LYMPH NODE BIOPSY;  Surgeon: [name], MD;  Location: [facility];  Service: General;  Laterality: Right;   CESAREAN SECTION     IR IMAGING GUIDED PORT INSERTION  09/15/2020   IR REMOVAL TUN CV CATH W/O FL  09/15/2020   NODE DISSECTION Right 03/28/2021   Procedure: RIGHT AXILLARY LYMPH NODE DISSECTION;  Surgeon: [name], MD;  Location: [facility];  Service: General;  Laterality: Right;   PORT-A-CATH REMOVAL Left 02/07/2021   Procedure: REMOVAL PORT-A-CATH;  Surgeon: [name], MD;  Location: [facility];  Service: General;  Laterality: Left;   THYROIDECTOMY, PARTIAL      There were no vitals filed for this visit.   Subjective Assessment - 04/27/21 1359     Subjective Nothing new. Appt for the cyst next Friday    Pertinent History Patient was diagnosed on 07/13/2020 with right grade 2 invasive ductal carcinoma breast cancer. It is ER/PR positive and HER2 negative with a Ki67 of 20%. Patient underwent neoadjuvant chemotherapy from 09/01/2020 - 01/14/2021. She had a right lumpectomy and sentinel node biopsy (1/1 nodes positive) on 02/07/2021. Hematoma evacuation on 02/18/2021. Axillary dissection on 03/28/2021 with 3/7 positive nodes. Iron deficiency.    Currently in Pain? Yes    Pain Score 6     Pain Location Arm    Pain Orientation Upper    Pain Descriptors / Indicators Aching    Pain Type Surgical pain    Pain Onset 1 to 4 weeks ago    Pain Frequency Constant                               [facility] Adult PT Treatment/Exercise - 04/27/21 0001       Exercises   Exercises Shoulder      Shoulder Exercises: Pulleys   Flexion 2 minutes    Flexion Limitations with initial instruction    Scaption 2 minutes  Scaption Limitations with initial instruction      Shoulder Exercises: Therapy Ball   Flexion Both;5 reps      Manual Therapy   Soft tissue mobilization To Rt upper trap in supine, deltoid and pectoralis    Manual Lymphatic Drainage (MLD) In Supine: Short neck, superficial and deep abdominals, Lt axillary nodes, Lt intact upper quadrant, and anterior inter-axillary anastomosis (avoided Rt inguinal nodes due to painful cyst here), then Rt chest wall and Rt upper arm redirecting towards anastomosis    Passive ROM In Supine to Rt shoulder into flexion and abduction to pts tolerance, slow mtion with long holds; VCs required throughout to relax due to muscle guarding                         PT Long Term Goals - 04/18/21 1046       PT  LONG TERM GOAL #1   Title Patient will demonstrate she has regained full shoulder ROM and function post operatively compared to baselines.    Baseline See eval for objectives    Time 4    Period Weeks    Status New    Target Date 05/16/21      PT LONG TERM GOAL #2   Title Patient will increase right shoulder active flexion to >/= 120 degrees for inceased ease reaching overhead.    Baseline 78 post op    Time 4    Period Weeks    Status New    Target Date 05/16/21      PT LONG TERM GOAL #3   Title Patient will increase her right shoulder active abduction to >/= 130 degrees to obtain radiation positioning.    Baseline 80 degrees    Time 4    Period Weeks    Status New    Target Date 05/16/21      PT LONG TERM GOAL #4   Title Patient will improve her DASH score to </= 53 for improved arm function.    Baseline 78    Time 4    Period Weeks    Status New    Target Date 05/16/21      PT LONG TERM GOAL #5   Title Patient will report she has a good understanding of lymphedema risk reduction practices.    Baseline No knowledge    Time 4    Period Weeks    Status New    Target Date 05/16/21                   Plan - 04/27/21 1453     Clinical Impression Statement Continued AAROM/PROM/STM/MLD for the Rt upper arm and upper quadrant.  Pt has radiation simulation on Monday.  Reports having not done any exercises yet so reemphasized starting these to get ready for radiation.  Pt has a very busy schedule with her son's appointments, radiation, and other medical so she had to cancel a few today.    PT Frequency 2x / week    PT Duration 4 weeks    PT Treatment/Interventions Therapeutic exercise;Patient/family education;Manual techniques;Manual lymph drainage;Scar mobilization;Passive range of motion;ADLs/Self Care Home Management    PT Next Visit Plan Cont PROM of Rt shoulder and manual therapy to Rt upper quadrant; AAROM exercises with pulleys and ball roll up wall     Consulted and Agree with Plan of Care Patient             Patient will benefit  from skilled therapeutic intervention in order to improve the following deficits and impairments:     Visit Diagnosis: Malignant neoplasm of upper-outer quadrant of right breast in female, estrogen receptor positive ([place])  Abnormal posture  Acute pain of right shoulder  Stiffness of right shoulder, not elsewhere classified     Problem List Patient Active Problem List   Diagnosis Date Noted   Amenorrhea, secondary 03/02/2021   PICC (peripherally inserted central catheter) in place 09/09/2020   Genetic testing 08/11/2020   Iron deficiency anemia due to chronic blood loss 08/04/2020   Family history of colon cancer    Malignant neoplasm of upper-outer quadrant of right breast in female, estrogen receptor positive ([place]) 07/28/2020    Stark Bray 04/27/2021, 2:55 PM  [place], [place], [address] Phone: [phone]   Fax:  [phone]  Name: Melody Rodriguez MRN: [id number] Date of Birth: 11/29/1975

## 2021-04-29 ENCOUNTER — Encounter: Payer: Medicaid Other | Admitting: Physical Therapy

## 2021-04-29 NOTE — Progress Notes (Signed)
Radiation Oncology         [phone] ________________________________  Name: Melody Rodriguez MRN: [id number]  Date: 05/02/2021  DOB: May 29, 1976  Re-Evaluation Note  CC: Patient, [name]  Nicholas Lose, MD    ICD-10-CM   1. Malignant neoplasm of upper-outer quadrant of right breast in female, estrogen receptor positive ([place])  C50.411 Ambulatory referral to Social Work   Z17.0       Diagnosis:  Stage IIA (cT2,cN1, cM0) Right Breast UOQ, Invasive Carcinoma with DCIS, ER+ / PR+ / Her2-, Grade 2  Narrative:  The patient returns today to discuss radiation treatment options. She was seen in the multidisciplinary breast clinic on 08/04/20.   Since consultation, she underwent genetic testing on 08/04/21. Results were negative. 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  She has been treated with neoadjuvant chemotherapy with dose dense Adriamycin and Cytoxan under Dr. Lindi Adie; first dose on 09/01/20. Patient's current treatment plan as of 04/19/21: paclitaxel Q21D / breast does dense AC Q14D.  She opted to proceed with right breast lumpectomy w/ nodal biopsy on 02/07/21 under Dr. [name]. Pathology from the procedure revealed: invasive carcinoma measuring 2.8 cm with high grade ductal carcinoma in-situ; calcifications associated with carcinoma; with margins uninvolved by carcinoma. Of note: lymphovascular space invasion present. Right axillary nodal biopsy revealed metastatic carcinoma involving one lymph node with extracapsular invasion present. Prognostic indicators significant for ER: 20%, positive; PR: 5%, positive, both with moderate staining intensity; Her2: negative; Ki67: 20%; Grade 2.  Of note: the patient developed postoperative hematoma from right axillary biopsy. Subsequently, she underwent evacuation of the hematoma on 02/18/21 under Dr. [name].  The patient then  underwent right axillary node dissection on 03/28/21 under Dr. [name]. Pathology from the procedure revealed: metastatic carcinoma in 3/7 lymph nodes. Microscopic description further detailed the largest micrometastasis to measure 4 mm.   Pertinent imaging since the patient was last seen is as follows:  --Bilateral breast MRI on 01/12/21 demonstrating a positive response to neoadjuvant chemotherapy; with a mild decrease in the size of the malignancy (decreasing from 2.7 to 2.5 cm). Additionally, the non mass enhancement previously visualized on prior imaging was noted to be no longer visualized. The abnormal right axillary nodes were noted to have significantly improved as well.  No new right breast masses or evidence of left breast malignancy was visualized.  On review of systems, the patient reports no pain within the breast area nipple discharge or bleeding. She denies swelling in her right arm or hand and any other symptoms.    Allergies:  is allergic to cephalexin, latex, and naproxen.  Meds: Current Outpatient Medications  Medication Sig Dispense Refill   acetaminophen (TYLENOL) 325 MG tablet Take by mouth.     cyclobenzaprine (FLEXERIL) 5 MG tablet TAKE 1 TABLET BY MOUTH 3 TIMES DAILY AS NEEDED FOR MUSCLE SPASMS. 20 tablet 0   fexofenadine (ALLEGRA) 180 MG tablet Take by mouth.     fluticasone (FLONASE) 50 MCG/ACT nasal spray 2 sprays by Each Nare route daily for 30 days.     loratadine (CLARITIN) 10 MG tablet Take 10 mg by mouth daily.     oxyCODONE (OXY IR/ROXICODONE) 5 MG immediate release tablet Take 1-2 tablets (5-10 mg total) by mouth every 6 (six) hours as needed for moderate pain, severe pain or breakthrough pain. 30 tablet 0   venlafaxine XR (EFFEXOR-XR) 37.5 MG 24 hr capsule TAKE 1  CAPSULE BY MOUTH DAILY WITH BREAKFAST 30 capsule 6   No current facility-administered medications for this encounter.    Physical Findings: The patient is in no acute distress. Patient is alert  and oriented.  height is _0  (1.626 m) and weight is 216 lb (98 kg). Her temperature is 97.9 F (36.6 C). Her blood pressure is 134/75 and her pulse is 84. Her respiration is 18 and oxygen saturation is 98%.  No significant changes. Lungs are clear to auscultation bilaterally. Heart has regular rate and rhythm. No palpable cervical, supraclavicular, or axillary adenopathy. Abdomen soft, non-tender, normal bowel sounds. Left breast: no palpable mass, nipple discharge or bleeding. Right breast: Lumpectomy scar well-healed.  No signs of infection within the breast.  No dominant mass appreciated breast nipple discharge or bleeding.  Lab Findings: Lab Results  Component Value Date   WBC 5.4 01/11/2021   HGB 12.6 01/11/2021   HCT 36.8 01/11/2021   MCV 96.1 01/11/2021   PLT 239 01/11/2021    Radiographic Findings: No results found.  Impression:  Stage IIA (cT2,cN1, cM0) Right Breast UOQ, Invasive Carcinoma with DCIS, ER+ / PR+ / Her2-, Grade 2  She is now ready to proceed with adjuvant radiation therapy.  Would recommend elective coverage of the axilla along with her breast radiation therapy given the lymph node involvement as above.  I discussed the general course of radiation therapy side effects and potential toxicities.  Patient appears to understand and wishes to proceed with planned course of treatment.  Plan:  Patient is scheduled for CT simulation later today.  Anticipate 6 and half weeks of radiation therapy.  -----------------------------------  Blair Promise, PhD, MD  This document serves as a record of services personally performed by Gery Pray, MD. It was created on his behalf by Roney Mans, a trained medical scribe. The creation of this record is based on the scribe's personal observations and the provider's statements to them. This document has been checked and approved by the attending provider.

## 2021-05-02 ENCOUNTER — Encounter: Payer: Self-pay | Admitting: Radiation Oncology

## 2021-05-02 ENCOUNTER — Ambulatory Visit
Admission: RE | Admit: 2021-05-02 | Discharge: 2021-05-02 | Disposition: A | Discharge status: Home / Self Care | Payer: Medicaid Other | Source: Ambulatory Visit | Attending: Radiation Oncology | Admitting: Radiation Oncology

## 2021-05-02 ENCOUNTER — Other Ambulatory Visit: Payer: Self-pay

## 2021-05-02 VITALS — BP 134/75 | HR 84 | Temp 97.9°F | Resp 18 | Ht 64.0 in | Wt 216.0 lb

## 2021-05-02 DIAGNOSIS — Z79899 Other long term (current) drug therapy: Secondary | ICD-10-CM | POA: Diagnosis not present

## 2021-05-02 DIAGNOSIS — Z170 Estrogen receptor positive status [ER+]: Secondary | ICD-10-CM

## 2021-05-02 DIAGNOSIS — C50411 Malignant neoplasm of upper-outer quadrant of right female breast: Secondary | ICD-10-CM | POA: Insufficient documentation

## 2021-05-02 DIAGNOSIS — Z510 Encounter for antineoplastic radiation therapy: Secondary | ICD-10-CM | POA: Insufficient documentation

## 2021-05-02 NOTE — Progress Notes (Signed)
See MD note for nursing evaluation. °

## 2021-05-03 ENCOUNTER — Encounter: Payer: Self-pay | Admitting: Licensed Clinical Social Worker

## 2021-05-03 NOTE — Progress Notes (Signed)
[facility] Psychosocial Distress Screening Clinical Social Work  Clinical Social Work was referred by distress screening protocol.  The patient scored a 7 on the Psychosocial Distress Thermometer which indicates moderate distress. Clinical Social Worker  attempted to contact patient by phone  to assess for distress and other psychosocial needs.  No answer. Left VM with direct contact information. CSW has worked with patient throughout treatment and pt is aware of support services.  [facility] DISTRESS SCREENING 05/02/2021  Screening Type Initial Screening  Distress experienced in past week (1-10) 7  Practical problem type Work/school  Family Problem type Children  Emotional problem type Depression;Nervousness/Anxiety;Adjusting to illness;Feeling hopeless;Boredom;Adjusting to appearance changes  Spiritual/Religous concerns type   Information Concerns Type Lack of info about treatment  Physical Problem type Pain;Sleep/insomnia;Breathing;Tingling hands/feet      [place] E Buford Gayler, LCSW

## 2021-05-04 ENCOUNTER — Ambulatory Visit: Payer: Medicaid Other | Admitting: Physical Therapy

## 2021-05-04 ENCOUNTER — Other Ambulatory Visit: Payer: Self-pay

## 2021-05-04 DIAGNOSIS — M25511 Pain in right shoulder: Secondary | ICD-10-CM

## 2021-05-04 DIAGNOSIS — Z170 Estrogen receptor positive status [ER+]: Secondary | ICD-10-CM

## 2021-05-04 DIAGNOSIS — C50411 Malignant neoplasm of upper-outer quadrant of right female breast: Secondary | ICD-10-CM

## 2021-05-04 DIAGNOSIS — R293 Abnormal posture: Secondary | ICD-10-CM

## 2021-05-04 DIAGNOSIS — M25611 Stiffness of right shoulder, not elsewhere classified: Secondary | ICD-10-CM

## 2021-05-04 NOTE — Therapy (Signed)
[place], [place], [address] Phone: [phone]   Fax:  [phone]  Physical Therapy Treatment  Patient Details  Name: Melody Rodriguez MRN: [id number] Date of Birth: 1976/06/09 Referring Provider (PT): Dr. [name]   Encounter Date: 05/04/2021   PT End of Session - 05/04/21 1612     Visit Number 5    Number of Visits 10    Date for PT Re-Evaluation 05/16/21    Authorization Type [employer] - needs auth after 12 visits    Authorization - Visit Number 5    Authorization - Number of Visits 12    PT Start Time 1200    PT Stop Time 1300    PT Time Calculation (min) 60 min    Activity Tolerance Patient limited by fatigue    Behavior During Therapy [facility] for tasks assessed/performed             Past Medical History:  Diagnosis Date   Breast cancer ([name])    Family history of colon cancer     Past Surgical History:  Procedure Laterality Date   BREAST BIOPSY Right 02/18/2021   Procedure: EVACUATION HEMATOMA RIGHT AXILLA;  Surgeon: [name], MD;  Location: [facility];  Service: General;  Laterality: Right;   BREAST CYST EXCISION Right    Patient does not recall (2014 or 2015)   BREAST LUMPECTOMY WITH RADIOACTIVE SEED AND SENTINEL LYMPH NODE BIOPSY Right 02/07/2021   Procedure: RIGHT BREAST LUMPECTOMY WITH RADIOACTIVE SEED AND SEED TARGETED LYMPH NODE BIOPSY AND SENTINEL LYMPH NODE BIOPSY;  Surgeon: [name], MD;  Location: [facility];  Service: General;  Laterality: Right;   CESAREAN SECTION     IR IMAGING GUIDED PORT INSERTION  09/15/2020   IR REMOVAL TUN CV CATH W/O FL  09/15/2020   NODE DISSECTION Right 03/28/2021   Procedure: RIGHT AXILLARY LYMPH NODE DISSECTION;  Surgeon: [name], MD;  Location: [facility];  Service: General;  Laterality: Right;   PORT-A-CATH REMOVAL Left 02/07/2021   Procedure: REMOVAL  PORT-A-CATH;  Surgeon: [name], MD;  Location: [facility];  Service: General;  Laterality: Left;   THYROIDECTOMY, PARTIAL      There were no vitals filed for this visit.   Subjective Assessment - 05/04/21 1204     Subjective Pt says her right shoulder is still achy and sore. "It feel like I pulled something"  Pt states she is so fatigues she can barely keep her eyes open    Pertinent History Patient was diagnosed on 07/13/2020 with right grade 2 invasive ductal carcinoma breast cancer. It is ER/PR positive and HER2 negative with a Ki67 of 20%. Patient underwent neoadjuvant chemotherapy from 09/01/2020 - 01/14/2021. She had a right lumpectomy and sentinel node biopsy (1/1 nodes positive) on 02/07/2021. Hematoma evacuation on 02/18/2021. Axillary dissection on 03/28/2021 with 3/7 positive nodes. Iron deficiency. Radiation starts 05/10/2021    Patient Stated Goals Get my arm better    Currently in Pain? Yes    Pain Score 6     Pain Location Breast    Pain Orientation Right    Pain Descriptors / Indicators Burning;Numbness   it pulls   Pain Type Surgical pain    Pain Radiating Towards from brast to under arm , gets worse    Pain Frequency Intermittent    Aggravating Factors  using arm , its her dominant hand so she uses it  LYMPHEDEMA/ONCOLOGY QUESTIONNAIRE - 05/04/21 0001       Right Upper Extremity Lymphedema   10 cm Proximal to Olecranon Process 35.5 cm    Olecranon Process 27.5 cm    10 cm Proximal to Ulnar Styloid Process 24 cm    Just Proximal to Ulnar Styloid Process 16.9 cm    Across Hand at [employer] 21 cm    At [place] of 2nd Digit 6.5 cm                        [facility] Adult PT Treatment/Exercise - 05/04/21 0001       Self-Care   Self-Care Other Self-Care Comments    Other Self-Care Comments  provided medium tg soft for upper arm and small tg soft for lower arm and instructed pt to keep a large fold at the top  and wear for comfort      Manual Therapy   Manual Therapy Soft tissue mobilization;Manual Lymphatic Drainage (MLD)    Manual therapy comments cording palpable at axilla with guitar string at eblow and pt feels pulling down into forearm    Soft tissue mobilization To Rt upper trap in supine, deltoid and pectoralis Pt not able to tolerate any pressure due to tenderness at posterior axilla    Manual Lymphatic Drainage (MLD) In Supine: Short neck, superficial and deep abdominals, Lt axillary nodes, Lt intact upper quadrant, and anterior inter-axillary anastomosis then to sidelying for extra time on posterior interaxillary anastamosis and fullness at posterior axilla    Passive ROM In Supine to Rt shoulder into flexion and abduction to pts tolerance, slow mtion with long holds; VCs required throughout to relax due to muscle guarding                         PT Long Term Goals - 04/18/21 1046       PT LONG TERM GOAL #1   Title Patient will demonstrate she has regained full shoulder ROM and function post operatively compared to baselines.    Baseline See eval for objectives    Time 4    Period Weeks    Status New    Target Date 05/16/21      PT LONG TERM GOAL #2   Title Patient will increase right shoulder active flexion to >/= 120 degrees for inceased ease reaching overhead.    Baseline 78 post op    Time 4    Period Weeks    Status New    Target Date 05/16/21      PT LONG TERM GOAL #3   Title Patient will increase her right shoulder active abduction to >/= 130 degrees to obtain radiation positioning.    Baseline 80 degrees    Time 4    Period Weeks    Status New    Target Date 05/16/21      PT LONG TERM GOAL #4   Title Patient will improve her DASH score to </= 53 for improved arm function.    Baseline 78    Time 4    Period Weeks    Status New    Target Date 05/16/21      PT LONG TERM GOAL #5   Title Patient will report she has a good understanding of  lymphedema risk reduction practices.    Baseline No knowledge    Time 4    Period Weeks    Status New  Target Date 05/16/21                   Plan - 05/04/21 1615     Clinical Impression Statement Pt reports she is having a lot of fatigue. She also has pain and fullness in her right arm with palpable cording in axilla and elbow with pulling she feels down to her wrist. Pt says she got some relieft with treatment today and tg soft initially feels good on her arm    Personal Factors and Comorbidities Comorbidity 3+    Comorbidities muliple surgeries, chemotherapy    Examination-Activity Limitations Reach Overhead;Lift;Carry    Examination-Participation Restrictions Occupation    PT Frequency 2x / week    PT Duration 4 weeks    PT Next Visit Plan Cont PROM of Rt shoulder and manual therapy to Rt upper quadrant; AAROM exercises with pulleys and ball roll up wall  how was tg soft?  schedule more visits?    Consulted and Agree with Plan of Care Patient             Patient will benefit from skilled therapeutic intervention in order to improve the following deficits and impairments:  Postural dysfunction, Decreased range of motion, Pain, Impaired UE functional use, Decreased knowledge of precautions  Visit Diagnosis: Malignant neoplasm of upper-outer quadrant of right breast in female, estrogen receptor positive ([place])  Abnormal posture  Acute pain of right shoulder  Stiffness of right shoulder, not elsewhere classified     Problem List Patient Active Problem List   Diagnosis Date Noted   Amenorrhea, secondary 03/02/2021   PICC (peripherally inserted central catheter) in place 09/09/2020   Genetic testing 08/11/2020   Iron deficiency anemia due to chronic blood loss 08/04/2020   Family history of colon cancer    Malignant neoplasm of upper-outer quadrant of right breast in female, estrogen receptor positive ([place]) 07/28/2020   Donato Heinz. Owens Shark PT  Norwood Levo 05/04/2021, 4:22 PM  [place], [place], [address] Phone: [phone]   Fax:  [phone]  Name: Melody Rodriguez MRN: [id number] Date of Birth: 1976-04-03

## 2021-05-06 ENCOUNTER — Ambulatory Visit: Payer: Medicaid Other | Admitting: Physical Therapy

## 2021-05-06 ENCOUNTER — Encounter: Payer: Self-pay | Admitting: Obstetrics and Gynecology

## 2021-05-06 ENCOUNTER — Ambulatory Visit (INDEPENDENT_AMBULATORY_CARE_PROVIDER_SITE_OTHER): Payer: Medicaid Other | Admitting: Obstetrics and Gynecology

## 2021-05-06 ENCOUNTER — Other Ambulatory Visit: Payer: Self-pay

## 2021-05-06 DIAGNOSIS — R293 Abnormal posture: Secondary | ICD-10-CM

## 2021-05-06 DIAGNOSIS — C50411 Malignant neoplasm of upper-outer quadrant of right female breast: Secondary | ICD-10-CM | POA: Diagnosis not present

## 2021-05-06 DIAGNOSIS — M25611 Stiffness of right shoulder, not elsewhere classified: Secondary | ICD-10-CM

## 2021-05-06 DIAGNOSIS — M25511 Pain in right shoulder: Secondary | ICD-10-CM

## 2021-05-06 DIAGNOSIS — Z170 Estrogen receptor positive status [ER+]: Secondary | ICD-10-CM

## 2021-05-06 DIAGNOSIS — N750 Cyst of Bartholin's gland: Secondary | ICD-10-CM | POA: Insufficient documentation

## 2021-05-06 NOTE — Therapy (Signed)
[facility] [address] [place], [place], [address] Phone: [phone]   Fax:  [phone]  Physical Therapy Treatment  Patient Details  Name: Melody Rodriguez MRN: [id number] Date of Birth: 05/06/1976 Referring Provider (PT): Dr. Douglas Blackman   Encounter Date: 05/06/2021   PT End of Session - 05/06/21 0954     Visit Number 6    Number of Visits 10    Date for PT Re-Evaluation 05/16/21    PT Start Time 0900    PT Stop Time 0955    PT Time Calculation (min) 55 min    Activity Tolerance Patient tolerated treatment well    Behavior During Therapy WFL for tasks assessed/performed             Past Medical History:  Diagnosis Date   Breast cancer (HCC)    Family history of colon cancer     Past Surgical History:  Procedure Laterality Date   BREAST BIOPSY Right 02/18/2021   Procedure: EVACUATION HEMATOMA RIGHT AXILLA;  Surgeon: Blackman, Douglas, MD;  Location: [facility];  Service: General;  Laterality: Right;   BREAST CYST EXCISION Right    Patient does not recall (2014 or 2015)   BREAST LUMPECTOMY WITH RADIOACTIVE SEED AND SENTINEL LYMPH NODE BIOPSY Right 02/07/2021   Procedure: RIGHT BREAST LUMPECTOMY WITH RADIOACTIVE SEED AND SEED TARGETED LYMPH NODE BIOPSY AND SENTINEL LYMPH NODE BIOPSY;  Surgeon: Blackman, Douglas, MD;  Location: [facility];  Service: General;  Laterality: Right;   CESAREAN SECTION     IR IMAGING GUIDED PORT INSERTION  09/15/2020   IR REMOVAL TUN CV CATH W/O FL  09/15/2020   NODE DISSECTION Right 03/28/2021   Procedure: RIGHT AXILLARY LYMPH NODE DISSECTION;  Surgeon: Blackman, Douglas, MD;  Location: [facility];  Service: General;  Laterality: Right;   PORT-A-CATH REMOVAL Left 02/07/2021   Procedure: REMOVAL PORT-A-CATH;  Surgeon: Blackman, Douglas, MD;  Location: [facility];  Service: General;  Laterality: Left;   THYROIDECTOMY, PARTIAL       There were no vitals filed for this visit.   Subjective Assessment - 05/06/21 0900     Subjective Pt says that she has been wearing the tg soft 24/7 except for showering and it has really helped her    Pertinent History Patient was diagnosed on 07/13/2020 with right grade 2 invasive ductal carcinoma breast cancer. It is ER/PR positive and HER2 negative with a Ki67 of 20%. Patient underwent neoadjuvant chemotherapy from 09/01/2020 - 01/14/2021. She had a right lumpectomy and sentinel node biopsy (1/1 nodes positive) on 02/07/2021. Hematoma evacuation on 02/18/2021. Axillary dissection on 03/28/2021 with 3/7 positive nodes. Iron deficiency. Radiation starts 05/10/2021    Patient Stated Goals Get my arm better    Currently in Pain? Yes    Pain Score 5     Pain Location Arm    Pain Orientation Right;Posterior    Pain Descriptors / Indicators Burning    Pain Onset 1 to 4 weeks ago    Pain Frequency Intermittent                               [facility] Adult PT Treatment/Exercise - 05/06/21 0001       Shoulder Exercises: Sidelying   ABduction AROM;Right;10 reps    Other Sidelying Exercises small circles with hand pointed to ceiling      Shoulder Exercises: Pulleys   Flexion   2 minutes    Scaption 1 minute    Scaption Limitations pt with tightness in right posterior shoulder      Manual Therapy   Manual Therapy Soft tissue mobilization;Manual Lymphatic Drainage (MLD);Edema management    Manual therapy comments cording less palpable today    Edema Management Provided another set of medium and small tg soft as pt finds it to be very helpful    Soft tissue mobilization To Rt upper trap in supine, deltoid and pectoralis Pt not able to tolerate any pressure due to tenderness at posterior axilla    Manual Lymphatic Drainage (MLD) In Supine: Short neck, superficial and deep abdominals, Lt axillary nodes, Lt intact upper quadrant, and anterior inter-axillary anastomosis then to  sidelying for extra time on posterior interaxillary anastamosis and fullness at posterior axilla    Passive ROM In Supine to Rt shoulder into flexion and abduction to pts tolerance, slow mtion with long holds; VCs required throughout to relax due to muscle guarding                         PT Long Term Goals - 04/18/21 1046       PT LONG TERM GOAL #1   Title Patient will demonstrate she has regained full shoulder ROM and function post operatively compared to baselines.    Baseline See eval for objectives    Time 4    Period Weeks    Status New    Target Date 05/16/21      PT LONG TERM GOAL #2   Title Patient will increase right shoulder active flexion to >/= 120 degrees for inceased ease reaching overhead.    Baseline 78 post op    Time 4    Period Weeks    Status New    Target Date 05/16/21      PT LONG TERM GOAL #3   Title Patient will increase her right shoulder active abduction to >/= 130 degrees to obtain radiation positioning.    Baseline 80 degrees    Time 4    Period Weeks    Status New    Target Date 05/16/21      PT LONG TERM GOAL #4   Title Patient will improve her DASH score to </= 53 for improved arm function.    Baseline 78    Time 4    Period Weeks    Status New    Target Date 05/16/21      PT LONG TERM GOAL #5   Title Patient will report she has a good understanding of lymphedema risk reduction practices.    Baseline No knowledge    Time 4    Period Weeks    Status New    Target Date 05/16/21                   Plan - 05/06/21 0955     Clinical Impression Statement Pt comes in wearing tg soft and Prarie Wear bra and states she is feeling much better. Cording is reduced but she can still feel it when she puts her arm in certain ways.  She has easier ROM and feels she can use her arms better with less pain. Pain reduced to 3/10 after treatment Pt agrees to getting measured for compresion sleeve so demographics sent to [facility]     Personal Factors and Comorbidities Comorbidity 3+    Comorbidities muliple surgeries, chemotherapy    Examination-Activity Limitations  Reach Overhead;Lift;Carry    Examination-Participation Restrictions Occupation    Stability/Clinical Decision Making Stable/Uncomplicated    Rehab Potential Excellent    PT Frequency 2x / week    PT Duration 4 weeks    PT Treatment/Interventions Therapeutic exercise;Patient/family education;Manual techniques;Manual lymph drainage;Scar mobilization;Passive range of motion;ADLs/Self Care Home Management    PT Next Visit Plan Cont PROM of Rt shoulder and manual therapy to Rt upper quadrant; AAROM exercises with pulleys and ball roll up wall  how was tg soft?  schedule more visits? measure for compression sleeve?             Patient will benefit from skilled therapeutic intervention in order to improve the following deficits and impairments:  Postural dysfunction, Decreased range of motion, Pain, Impaired UE functional use, Decreased knowledge of precautions  Visit Diagnosis: Malignant neoplasm of upper-outer quadrant of right breast in female, estrogen receptor positive ([place])  Abnormal posture  Acute pain of right shoulder  Stiffness of right shoulder, not elsewhere classified     Problem List Patient Active Problem List   Diagnosis Date Noted   Amenorrhea, secondary 03/02/2021   PICC (peripherally inserted central catheter) in place 09/09/2020   Genetic testing 08/11/2020   Iron deficiency anemia due to chronic blood loss 08/04/2020   Family history of colon cancer    Malignant neoplasm of upper-outer quadrant of right breast in female, estrogen receptor positive ([place]) 07/28/2020   Donato Heinz. Owens Shark PT  Norwood Levo 05/06/2021, 9:59 AM  [place], [place], [address] Phone: [phone]   Fax:  [phone]  Name: Melody Rodriguez MRN: [id number] Date of  Birth: 06/24/1976

## 2021-05-06 NOTE — Progress Notes (Signed)
Reports has a cyst that "busted and drained" but she can still feel it.

## 2021-05-06 NOTE — Progress Notes (Signed)
Melody Rodriguez presents for eval of possible Bartholin cyst. Noted 2 weeks ago Spontanous drainage 1 week ago. Feels much better now  PE AF VSS   Chaperone present during exam Lungs clear Heart RRR Abd soft + BS GU Nl EGBUS, no evidence of labial or bartholin cyst on today's exam  A/P H/O Labial vs Bartholin cyst  Pt reassured. Instructed to call office for antibiotic if recurs.

## 2021-05-09 ENCOUNTER — Encounter: Payer: Self-pay | Admitting: *Deleted

## 2021-05-09 ENCOUNTER — Ambulatory Visit: Payer: Medicaid Other | Admitting: Radiation Oncology

## 2021-05-09 ENCOUNTER — Ambulatory Visit: Payer: Medicaid Other

## 2021-05-09 ENCOUNTER — Telehealth: Payer: Self-pay | Admitting: Hematology and Oncology

## 2021-05-09 ENCOUNTER — Other Ambulatory Visit: Payer: Self-pay

## 2021-05-09 VITALS — Wt 213.0 lb

## 2021-05-09 DIAGNOSIS — Z483 Aftercare following surgery for neoplasm: Secondary | ICD-10-CM

## 2021-05-09 NOTE — Telephone Encounter (Signed)
Scheduled appt per 8/22 sch msg. Pt aware.

## 2021-05-09 NOTE — Therapy (Signed)
[place], [place], [address] Phone: [phone]   Fax:  [phone]  Physical Therapy Treatment  Patient Details  Name: Melody Rodriguez MRN: [id number] Date of Birth: 11/19/1975 Referring Provider (PT): Dr. [name]   Encounter Date: 05/09/2021   PT End of Session - 05/09/21 0916     Visit Number 6   # unchanged due to screen only   PT Start Time 0908    PT Stop Time 0915    PT Time Calculation (min) 7 min    Activity Tolerance Patient tolerated treatment well    Behavior During Therapy [facility] for tasks assessed/performed             Past Medical History:  Diagnosis Date   Breast cancer [facility])    Family history of colon cancer     Past Surgical History:  Procedure Laterality Date   BREAST BIOPSY Right 02/18/2021   Procedure: EVACUATION HEMATOMA RIGHT AXILLA;  Surgeon: [name], MD;  Location: [facility];  Service: General;  Laterality: Right;   BREAST CYST EXCISION Right    Patient does not recall (2014 or 2015)   BREAST LUMPECTOMY WITH RADIOACTIVE SEED AND SENTINEL LYMPH NODE BIOPSY Right 02/07/2021   Procedure: RIGHT BREAST LUMPECTOMY WITH RADIOACTIVE SEED AND SEED TARGETED LYMPH NODE BIOPSY AND SENTINEL LYMPH NODE BIOPSY;  Surgeon: [name], MD;  Location: [facility];  Service: General;  Laterality: Right;   CESAREAN SECTION     IR IMAGING GUIDED PORT INSERTION  09/15/2020   IR REMOVAL TUN CV CATH W/O FL  09/15/2020   NODE DISSECTION Right 03/28/2021   Procedure: RIGHT AXILLARY LYMPH NODE DISSECTION;  Surgeon: [name], MD;  Location: [facility];  Service: General;  Laterality: Right;   PORT-A-CATH REMOVAL Left 02/07/2021   Procedure: REMOVAL PORT-A-CATH;  Surgeon: [name], MD;  Location: [facility];  Service: General;  Laterality: Left;   THYROIDECTOMY, PARTIAL      Vitals:   05/09/21 0912   Weight: 213 lb (96.6 kg)     Subjective Assessment - 05/09/21 0912     Subjective Pt returns for her 3 month L-Dex screen.    Pertinent History Patient was diagnosed on 07/13/2020 with right grade 2 invasive ductal carcinoma breast cancer. It is ER/PR positive and HER2 negative with a Ki67 of 20%. Patient underwent neoadjuvant chemotherapy from 09/01/2020 - 01/14/2021. She had a right lumpectomy and sentinel node biopsy (1/1 nodes positive) on 02/07/2021. Hematoma evacuation on 02/18/2021. Axillary dissection on 03/28/2021 with 3/7 positive nodes. Iron deficiency. Radiation starts 05/10/2021                    L-DEX FLOWSHEETS - 05/09/21 0900       L-DEX LYMPHEDEMA SCREENING   Measurement Type Unilateral    L-DEX MEASUREMENT EXTREMITY Upper Extremity    POSITION  Standing    DOMINANT SIDE Right    At Risk Side Right    BASELINE SCORE (UNILATERAL) 0.4    L-DEX SCORE (UNILATERAL) -4.5    VALUE CHANGE (UNILAT) -4.9                                    PT Long Term Goals - 04/18/21 1046       PT LONG TERM GOAL #1   Title Patient will demonstrate she has regained full shoulder  ROM and function post operatively compared to baselines.    Baseline See eval for objectives    Time 4    Period Weeks    Status New    Target Date 05/16/21      PT LONG TERM GOAL #2   Title Patient will increase right shoulder active flexion to >/= 120 degrees for inceased ease reaching overhead.    Baseline 78 post op    Time 4    Period Weeks    Status New    Target Date 05/16/21      PT LONG TERM GOAL #3   Title Patient will increase her right shoulder active abduction to >/= 130 degrees to obtain radiation positioning.    Baseline 80 degrees    Time 4    Period Weeks    Status New    Target Date 05/16/21      PT LONG TERM GOAL #4   Title Patient will improve her DASH score to </= 53 for improved arm function.    Baseline 78    Time 4    Period Weeks     Status New    Target Date 05/16/21      PT LONG TERM GOAL #5   Title Patient will report she has a good understanding of lymphedema risk reduction practices.    Baseline No knowledge    Time 4    Period Weeks    Status New    Target Date 05/16/21                   Plan - 05/09/21 0916     Clinical Impression Statement Pt returns for her 3 month L-Dex screen. Her change from baseline of -4.9 is WNLs so no further treament is required at this time except to cont every 3 month L-Dex screens which pt is agreeable to.    PT Next Visit Plan Cont PROM of Rt shoulder and manual therapy to Rt upper quadrant; AAROM exercises with pulleys and ball roll up wall  how was tg soft?  schedule more visits? measure for compression sleeve?/ cont every 3 month L-Dex screens for up to 2 years from her [facility]    Consulted and Agree with Plan of Care Patient             Patient will benefit from skilled therapeutic intervention in order to improve the following deficits and impairments:     Visit Diagnosis: Aftercare following surgery for neoplasm     Problem List Patient Active Problem List   Diagnosis Date Noted   Bartholin cyst 05/06/2021   Amenorrhea, secondary 03/02/2021   PICC (peripherally inserted central catheter) in place 09/09/2020   Genetic testing 08/11/2020   Iron deficiency anemia due to chronic blood loss 08/04/2020   Family history of colon cancer    Malignant neoplasm of upper-outer quadrant of right breast in female, estrogen receptor positive ([place]) 07/28/2020    Otelia Limes, PTA 05/09/2021, 9:18 AM  [place], [place], [address] Phone: [phone]   Fax:  [phone]  Name: Glenna Brunkow MRN: [id number] Date of Birth: 07-03-1976

## 2021-05-10 ENCOUNTER — Ambulatory Visit
Admission: RE | Admit: 2021-05-10 | Discharge: 2021-05-10 | Disposition: A | Discharge status: Home / Self Care | Payer: Medicaid Other | Source: Ambulatory Visit | Attending: Radiation Oncology | Admitting: Radiation Oncology

## 2021-05-10 DIAGNOSIS — C50411 Malignant neoplasm of upper-outer quadrant of right female breast: Secondary | ICD-10-CM

## 2021-05-10 DIAGNOSIS — Z170 Estrogen receptor positive status [ER+]: Secondary | ICD-10-CM

## 2021-05-10 DIAGNOSIS — Z510 Encounter for antineoplastic radiation therapy: Secondary | ICD-10-CM | POA: Diagnosis not present

## 2021-05-10 MED ORDER — ALRA NON-METALLIC DEODORANT (RAD-ONC)
1.0000 "application " | Freq: Once | TOPICAL | Status: AC
  Administered 2021-05-10: 1 via TOPICAL

## 2021-05-10 MED ORDER — RADIAPLEXRX EX GEL
1.0000 | Freq: Once | CUTANEOUS | Status: AC
Start: 2021-05-10 — End: 2021-05-10
  Administered 2021-05-10: 1 via TOPICAL

## 2021-05-11 ENCOUNTER — Other Ambulatory Visit: Payer: Self-pay

## 2021-05-11 ENCOUNTER — Ambulatory Visit
Admission: RE | Admit: 2021-05-11 | Discharge: 2021-05-11 | Disposition: A | Discharge status: Home / Self Care | Payer: Medicaid Other | Source: Ambulatory Visit | Attending: Radiation Oncology | Admitting: Radiation Oncology

## 2021-05-11 DIAGNOSIS — Z510 Encounter for antineoplastic radiation therapy: Secondary | ICD-10-CM | POA: Diagnosis not present

## 2021-05-12 ENCOUNTER — Ambulatory Visit
Admission: RE | Admit: 2021-05-12 | Discharge: 2021-05-12 | Disposition: A | Discharge status: Home / Self Care | Payer: Medicaid Other | Source: Ambulatory Visit | Attending: Radiation Oncology | Admitting: Radiation Oncology

## 2021-05-12 ENCOUNTER — Other Ambulatory Visit: Payer: Self-pay

## 2021-05-12 DIAGNOSIS — Z510 Encounter for antineoplastic radiation therapy: Secondary | ICD-10-CM | POA: Diagnosis not present

## 2021-05-13 ENCOUNTER — Ambulatory Visit
Admission: RE | Admit: 2021-05-13 | Discharge: 2021-05-13 | Disposition: A | Discharge status: Home / Self Care | Payer: Medicaid Other | Source: Ambulatory Visit | Attending: Radiation Oncology | Admitting: Radiation Oncology

## 2021-05-13 DIAGNOSIS — Z510 Encounter for antineoplastic radiation therapy: Secondary | ICD-10-CM | POA: Diagnosis not present

## 2021-05-16 ENCOUNTER — Ambulatory Visit
Admission: RE | Admit: 2021-05-16 | Discharge: 2021-05-16 | Disposition: A | Discharge status: Home / Self Care | Payer: Medicaid Other | Source: Ambulatory Visit | Attending: Radiation Oncology | Admitting: Radiation Oncology

## 2021-05-16 ENCOUNTER — Other Ambulatory Visit: Payer: Self-pay

## 2021-05-16 DIAGNOSIS — Z510 Encounter for antineoplastic radiation therapy: Secondary | ICD-10-CM | POA: Diagnosis not present

## 2021-05-17 ENCOUNTER — Ambulatory Visit
Admission: RE | Admit: 2021-05-17 | Discharge: 2021-05-17 | Disposition: A | Discharge status: Home / Self Care | Payer: Medicaid Other | Source: Ambulatory Visit | Attending: Radiation Oncology | Admitting: Radiation Oncology

## 2021-05-17 DIAGNOSIS — Z510 Encounter for antineoplastic radiation therapy: Secondary | ICD-10-CM | POA: Diagnosis not present

## 2021-05-18 ENCOUNTER — Other Ambulatory Visit: Payer: Self-pay

## 2021-05-18 ENCOUNTER — Ambulatory Visit
Admission: RE | Admit: 2021-05-18 | Discharge: 2021-05-18 | Disposition: A | Discharge status: Home / Self Care | Payer: Medicaid Other | Source: Ambulatory Visit | Attending: Radiation Oncology | Admitting: Radiation Oncology

## 2021-05-18 DIAGNOSIS — Z510 Encounter for antineoplastic radiation therapy: Secondary | ICD-10-CM | POA: Diagnosis not present

## 2021-05-19 ENCOUNTER — Ambulatory Visit
Admission: RE | Admit: 2021-05-19 | Discharge: 2021-05-19 | Disposition: A | Discharge status: Home / Self Care | Payer: Medicaid Other | Source: Ambulatory Visit | Attending: Radiation Oncology | Admitting: Radiation Oncology

## 2021-05-19 DIAGNOSIS — Z483 Aftercare following surgery for neoplasm: Secondary | ICD-10-CM | POA: Diagnosis present

## 2021-05-19 DIAGNOSIS — I890 Lymphedema, not elsewhere classified: Secondary | ICD-10-CM | POA: Diagnosis present

## 2021-05-19 DIAGNOSIS — L599 Disorder of the skin and subcutaneous tissue related to radiation, unspecified: Secondary | ICD-10-CM | POA: Diagnosis present

## 2021-05-19 DIAGNOSIS — R293 Abnormal posture: Secondary | ICD-10-CM | POA: Diagnosis present

## 2021-05-19 DIAGNOSIS — C50411 Malignant neoplasm of upper-outer quadrant of right female breast: Secondary | ICD-10-CM | POA: Insufficient documentation

## 2021-05-19 DIAGNOSIS — M25511 Pain in right shoulder: Secondary | ICD-10-CM | POA: Diagnosis present

## 2021-05-19 DIAGNOSIS — Z510 Encounter for antineoplastic radiation therapy: Secondary | ICD-10-CM | POA: Insufficient documentation

## 2021-05-19 DIAGNOSIS — Z170 Estrogen receptor positive status [ER+]: Secondary | ICD-10-CM | POA: Insufficient documentation

## 2021-05-19 DIAGNOSIS — M25611 Stiffness of right shoulder, not elsewhere classified: Secondary | ICD-10-CM | POA: Diagnosis present

## 2021-05-20 ENCOUNTER — Ambulatory Visit
Admission: RE | Admit: 2021-05-20 | Discharge: 2021-05-20 | Disposition: A | Discharge status: Home / Self Care | Payer: Medicaid Other | Source: Ambulatory Visit | Attending: Radiation Oncology | Admitting: Radiation Oncology

## 2021-05-20 ENCOUNTER — Other Ambulatory Visit: Payer: Self-pay

## 2021-05-20 DIAGNOSIS — I890 Lymphedema, not elsewhere classified: Secondary | ICD-10-CM | POA: Diagnosis not present

## 2021-05-24 ENCOUNTER — Ambulatory Visit: Admission: RE | Admit: 2021-05-24 | Payer: Medicaid Other | Source: Ambulatory Visit

## 2021-05-24 ENCOUNTER — Other Ambulatory Visit: Payer: Self-pay

## 2021-05-25 ENCOUNTER — Ambulatory Visit
Admission: RE | Admit: 2021-05-25 | Discharge: 2021-05-25 | Disposition: A | Discharge status: Home / Self Care | Payer: Medicaid Other | Source: Ambulatory Visit | Attending: Radiation Oncology | Admitting: Radiation Oncology

## 2021-05-25 DIAGNOSIS — I890 Lymphedema, not elsewhere classified: Secondary | ICD-10-CM | POA: Diagnosis not present

## 2021-05-26 ENCOUNTER — Ambulatory Visit
Admission: RE | Admit: 2021-05-26 | Discharge: 2021-05-26 | Disposition: A | Discharge status: Home / Self Care | Payer: Medicaid Other | Source: Ambulatory Visit | Attending: Radiation Oncology | Admitting: Radiation Oncology

## 2021-05-26 ENCOUNTER — Other Ambulatory Visit: Payer: Self-pay

## 2021-05-26 DIAGNOSIS — I890 Lymphedema, not elsewhere classified: Secondary | ICD-10-CM | POA: Diagnosis not present

## 2021-05-27 ENCOUNTER — Ambulatory Visit
Admission: RE | Admit: 2021-05-27 | Discharge: 2021-05-27 | Disposition: A | Discharge status: Home / Self Care | Payer: Medicaid Other | Source: Ambulatory Visit | Attending: Radiation Oncology | Admitting: Radiation Oncology

## 2021-05-27 DIAGNOSIS — I890 Lymphedema, not elsewhere classified: Secondary | ICD-10-CM | POA: Diagnosis not present

## 2021-05-30 ENCOUNTER — Other Ambulatory Visit: Payer: Self-pay

## 2021-05-30 ENCOUNTER — Encounter: Payer: Self-pay | Admitting: *Deleted

## 2021-05-30 ENCOUNTER — Ambulatory Visit
Admission: RE | Admit: 2021-05-30 | Discharge: 2021-05-30 | Disposition: A | Discharge status: Home / Self Care | Payer: Medicaid Other | Source: Ambulatory Visit | Attending: Radiation Oncology | Admitting: Radiation Oncology

## 2021-05-30 DIAGNOSIS — I890 Lymphedema, not elsewhere classified: Secondary | ICD-10-CM | POA: Diagnosis not present

## 2021-05-31 ENCOUNTER — Ambulatory Visit: Payer: Medicaid Other | Attending: Surgery

## 2021-05-31 ENCOUNTER — Ambulatory Visit
Admission: RE | Admit: 2021-05-31 | Discharge: 2021-05-31 | Disposition: A | Discharge status: Home / Self Care | Payer: Medicaid Other | Source: Ambulatory Visit | Attending: Radiation Oncology | Admitting: Radiation Oncology

## 2021-05-31 DIAGNOSIS — L599 Disorder of the skin and subcutaneous tissue related to radiation, unspecified: Secondary | ICD-10-CM

## 2021-05-31 DIAGNOSIS — C50411 Malignant neoplasm of upper-outer quadrant of right female breast: Secondary | ICD-10-CM

## 2021-05-31 DIAGNOSIS — Z170 Estrogen receptor positive status [ER+]: Secondary | ICD-10-CM

## 2021-05-31 DIAGNOSIS — R293 Abnormal posture: Secondary | ICD-10-CM

## 2021-05-31 DIAGNOSIS — M25511 Pain in right shoulder: Secondary | ICD-10-CM

## 2021-05-31 DIAGNOSIS — I890 Lymphedema, not elsewhere classified: Secondary | ICD-10-CM

## 2021-05-31 DIAGNOSIS — Z483 Aftercare following surgery for neoplasm: Secondary | ICD-10-CM

## 2021-05-31 DIAGNOSIS — M25611 Stiffness of right shoulder, not elsewhere classified: Secondary | ICD-10-CM

## 2021-05-31 NOTE — Patient Instructions (Signed)
Shoulder: Abduction (Supine)    With right arm flat on floor, hold dowel in palm. Slowly move arm up to side of head by pushing with opposite arm. Do not let elbow bend. Hold _5-10___ seconds. Repeat _5-10___ times. Do _2___ sessions per day. CAUTION: Stretch slowly and gently.  Copyright  VHI. All rights reserved.  Shoulder: Flexion (Supine)    With hands shoulder width apart, slowly lower dowel to floor behind head. Do not let elbows bend. Keep back flat. Then repeat with arms further out in a "V" Hold _5-10___ seconds. Repeat __5-10__ times. Do __2__ sessions per day. CAUTION: Stretch slowly and gently.  Copyright  VHI. All rights reserved.

## 2021-05-31 NOTE — Therapy (Signed)
[place], [place], [address] Phone: [phone]   Fax:  [phone]  Physical Therapy Treatment  Patient Details  Name: Melody Rodriguez MRN: [id number] Date of Birth: 01-08-1976 Referring Provider (PT): Dr. [name]   Encounter Date: 05/31/2021   PT End of Session - 05/31/21 1151     Visit Number 7    Number of Visits 15    Date for PT Re-Evaluation 06/28/21    Authorization Type [employer] - needs auth after 12 visits    Authorization - Visit Number 6    Authorization - Number of Visits 12    PT Start Time 1110    PT Stop Time 1155    PT Time Calculation (min) 45 min    Activity Tolerance Patient tolerated treatment well    Behavior During Therapy [facility] for tasks assessed/performed             Past Medical History:  Diagnosis Date   Breast cancer ([place])    Family history of colon cancer     Past Surgical History:  Procedure Laterality Date   BREAST BIOPSY Right 02/18/2021   Procedure: EVACUATION HEMATOMA RIGHT AXILLA;  Surgeon: [name], MD;  Location: [place];  Service: General;  Laterality: Right;   BREAST CYST EXCISION Right    Patient does not recall (2014 or 2015)   BREAST LUMPECTOMY WITH RADIOACTIVE SEED AND SENTINEL LYMPH NODE BIOPSY Right 02/07/2021   Procedure: RIGHT BREAST LUMPECTOMY WITH RADIOACTIVE SEED AND SEED TARGETED LYMPH NODE BIOPSY AND SENTINEL LYMPH NODE BIOPSY;  Surgeon: [name], MD;  Location: [place];  Service: General;  Laterality: Right;   CESAREAN SECTION     IR IMAGING GUIDED PORT INSERTION  09/15/2020   IR REMOVAL TUN CV CATH W/O FL  09/15/2020   NODE DISSECTION Right 03/28/2021   Procedure: RIGHT AXILLARY LYMPH NODE DISSECTION;  Surgeon: [name], MD;  Location: [place];  Service: General;  Laterality: Right;   PORT-A-CATH REMOVAL Left 02/07/2021   Procedure: REMOVAL  PORT-A-CATH;  Surgeon: [name], MD;  Location: [place];  Service: General;  Laterality: Left;   THYROIDECTOMY, PARTIAL      There were no vitals filed for this visit.   Subjective Assessment - 05/31/21 1112     Subjective I did not have any scheduled appointments. My arm feels heavy. I am feeling some swelling in my breast.    Pertinent History Patient was diagnosed on 07/13/2020 with right grade 2 invasive ductal carcinoma breast cancer. It is ER/PR positive and HER2 negative with a Ki67 of 20%. Patient underwent neoadjuvant chemotherapy from 09/01/2020 - 01/14/2021. She had a right lumpectomy and sentinel node biopsy (1/1 nodes positive) on 02/07/2021. Hematoma evacuation on 02/18/2021. Axillary dissection on 03/28/2021 with 3/7 positive nodes. Iron deficiency. Radiation starts 05/10/2021    Patient Stated Goals Get my arm better    Currently in Pain? Yes    Pain Score 7     Pain Location Arm    Pain Orientation Upper    Pain Descriptors / Indicators Throbbing;Shooting    Pain Type Acute pain    Pain Onset More than a month ago    Pain Frequency Intermittent    Aggravating Factors  nothing    Pain Relieving Factors nothing    Effect of Pain on Daily Activities does not do much now due to the pain  [facility] PT Assessment - 05/31/21 0001       Observation/Other Assessments   Skin Integrity firmness and edema near incision and proximal to nipple      AROM   Right Shoulder Flexion 139 Degrees    Right Shoulder ABduction 137 Degrees               LYMPHEDEMA/ONCOLOGY QUESTIONNAIRE - 05/31/21 0001       Right Upper Extremity Lymphedema   10 cm Proximal to Olecranon Process 33.9 cm    Olecranon Process 27.6 cm    10 cm Proximal to Ulnar Styloid Process 23.6 cm    Just Proximal to Ulnar Styloid Process 17.8 cm    Across Hand at [employer] 21.3 cm    At [place] of 2nd Digit 6.5 cm             L-DEX FLOWSHEETS - 05/31/21 1100        L-DEX LYMPHEDEMA SCREENING   Measurement Type Unilateral    L-DEX MEASUREMENT EXTREMITY Upper Extremity    POSITION  Standing    DOMINANT SIDE Right    At Risk Side Right    BASELINE SCORE (UNILATERAL) 0.4    L-DEX SCORE (UNILATERAL) -0.7    VALUE CHANGE (UNILAT) -1.1               Quick Dash - 05/31/21 0001     Open a tight or new jar Moderate difficulty    Do heavy household chores (wash walls, wash floors) Severe difficulty    Carry a shopping bag or briefcase Moderate difficulty    Wash your back Moderate difficulty    Use a knife to cut food No difficulty    Recreational activities in which you take some force or impact through your arm, shoulder, or hand (golf, hammering, tennis) Moderate difficulty    During the past week, to what extent has your arm, shoulder or hand problem interfered with your normal social activities with family, friends, neighbors, or groups? Modererately    During the past week, to what extent has your arm, shoulder or hand problem limited your work or other regular daily activities Modererately    Arm, shoulder, or hand pain. Moderate    Tingling (pins and needles) in your arm, shoulder, or hand Severe    Difficulty Sleeping Moderate difficulty    DASH Score 50 %                    [facility] Adult PT Treatment/Exercise - 05/31/21 0001       Shoulder Exercises: Supine   Flexion AAROM;Both;5 reps;Other (comment)   using dowel in to flexion and scaption   ABduction AAROM;Right;5 reps   uisng dowel                    PT Education - 05/31/21 1148     Education Details lymphedema risk reduction practices, supine dowel exercises    Person(s) Educated Patient    Methods Explanation    Comprehension Verbalized understanding                 PT Long Term Goals - 05/31/21 1117       PT LONG TERM GOAL #1   Title Patient will demonstrate she has regained full shoulder ROM and function post operatively compared to  baselines.    Time 4    Period Weeks    Status On-going      PT LONG TERM GOAL #  2   Title Patient will increase right shoulder active flexion to >/= 120 degrees for inceased ease reaching overhead.    Baseline 78 post op; 05/31/21- 139    Time 4    Period Weeks    Status Achieved      PT LONG TERM GOAL #3   Title Patient will increase her right shoulder active abduction to >/= 130 degrees to obtain radiation positioning.    Baseline 80 degrees; 05/31/21- 137    Time 4    Period Weeks    Status Achieved      PT LONG TERM GOAL #4   Title Patient will improve her DASH score to </= 53 for improved arm function.    Baseline 78; 05/31/21- 50    Time 4    Period Weeks    Status Achieved      PT LONG TERM GOAL #5   Title Patient will report she has a good understanding of lymphedema risk reduction practices.    Baseline No knowledge; 05/31/21- educated pt on this today and issued handout    Time 4    Period Weeks    Status On-going      Additional Long Term Goals   Additional Long Term Goals Yes      PT LONG TERM GOAL #6   Title Pt will be independent in a home exercise program for continued strengthening and stretching.    Time 4    Period Weeks    Status New    Target Date 06/28/21      PT LONG TERM GOAL #7   Title Pt will report a 50% improvement in pain to allow her to clean the bathroom and do ADLs with improved comfort.    Baseline 7/10    Time 4    Period Weeks    Status New    Target Date 06/28/21      PT LONG TERM GOAL #8   Title Pt will be independent in self MLD for long term management of edema.    Time 4    Period Weeks    Status New    Target Date 06/28/21      PT LONG TERM GOAL  #9   TITLE Pt will be able to reach up in to the cabinet with her right arm to put up dishes to allow her to return to prior level of function.    Baseline uses L UE    Time 4    Period Weeks    Status New    Target Date 06/28/21                   Plan - 05/31/21  1142     Clinical Impression Statement Pt returns to PT after not being seen since 8/19 due to scheduling.  Pt reports that her RUE is feeling heavy and she has intermittent pain that is affecting her ability to do her ADLs. Pt reports her cording has improved and her ROM has improved since her eval and she has met her ROM goals but she is still limited when reaching overhead to put dishes in the cabinet. She has now presents with R breast edema with fibrosis present just superior to nipple and in vicinity of her scar. She is half way through radiation at this point. Pt has a compression bra but has not been wearing it regularly. She would benefit from continued skilled PT services to continue to improve R shoulder ROM,  decrease pain, improve strength and decrease R breast lymphedema.    Rehab Potential Good    PT Frequency 2x / week    PT Duration 4 weeks    PT Treatment/Interventions Therapeutic exercise;Patient/family education;Manual techniques;Manual lymph drainage;Scar mobilization;Passive range of motion;ADLs/Self Care Home Management;Orthotic Fit/Training;Vasopneumatic Device;Taping    PT Next Visit Plan Cont PROM of Rt shoulder and manual therapy to Rt upper quadrant; AAROM exercises with pulleys and ball roll up wall, MLD for R breast and instruct pt and issued handout,  measure for compression sleeve?/ cont every 3 month L-Dex screens for up to 2 years from her SLNB    PT Home Exercise Plan Post op shoulder ROM HEP, supine dowel exercises    Consulted and Agree with Plan of Care Patient             Patient will benefit from skilled therapeutic intervention in order to improve the following deficits and impairments:  Postural dysfunction, Decreased range of motion, Pain, Impaired UE functional use, Decreased knowledge of precautions, Increased fascial restricitons, Decreased scar mobility, Increased edema, Decreased strength  Visit Diagnosis: Lymphedema, not elsewhere classified - Plan:  PT plan of care cert/re-cert  Disorder of the skin and subcutaneous tissue related to radiation, unspecified - Plan: PT plan of care cert/re-cert  Aftercare following surgery for neoplasm - Plan: PT plan of care cert/re-cert  Stiffness of right shoulder, not elsewhere classified - Plan: PT plan of care cert/re-cert  Acute pain of right shoulder - Plan: PT plan of care cert/re-cert  Abnormal posture - Plan: PT plan of care cert/re-cert  Malignant neoplasm of upper-outer quadrant of right breast in female, estrogen receptor positive ([place]) - Plan: PT plan of care cert/re-cert     Problem List Patient Active Problem List   Diagnosis Date Noted   Bartholin cyst 05/06/2021   Amenorrhea, secondary 03/02/2021   PICC (peripherally inserted central catheter) in place 09/09/2020   Genetic testing 08/11/2020   Iron deficiency anemia due to chronic blood loss 08/04/2020   Family history of colon cancer    Malignant neoplasm of upper-outer quadrant of right breast in female, estrogen receptor positive ([place]) 07/28/2020    Claris Pong, PT 05/31/2021, 11:58 AM  [facility] [place], [place], [address] Phone: [phone]   Fax:  [phone]  Name: Daziah Hesler MRN: [id number] Date of Birth: [birth year]  Cheral Almas, PT 05/31/21 11:58 AM

## 2021-06-01 ENCOUNTER — Ambulatory Visit
Admission: RE | Admit: 2021-06-01 | Discharge: 2021-06-01 | Disposition: A | Discharge status: Home / Self Care | Payer: Medicaid Other | Source: Ambulatory Visit | Attending: Radiation Oncology | Admitting: Radiation Oncology

## 2021-06-01 ENCOUNTER — Other Ambulatory Visit: Payer: Self-pay

## 2021-06-01 DIAGNOSIS — I890 Lymphedema, not elsewhere classified: Secondary | ICD-10-CM | POA: Diagnosis not present

## 2021-06-02 ENCOUNTER — Ambulatory Visit
Admission: RE | Admit: 2021-06-02 | Discharge: 2021-06-02 | Disposition: A | Discharge status: Home / Self Care | Payer: Medicaid Other | Source: Ambulatory Visit | Attending: Radiation Oncology | Admitting: Radiation Oncology

## 2021-06-02 ENCOUNTER — Ambulatory Visit: Payer: Medicaid Other

## 2021-06-02 DIAGNOSIS — I890 Lymphedema, not elsewhere classified: Secondary | ICD-10-CM | POA: Diagnosis not present

## 2021-06-02 DIAGNOSIS — L599 Disorder of the skin and subcutaneous tissue related to radiation, unspecified: Secondary | ICD-10-CM

## 2021-06-02 DIAGNOSIS — Z483 Aftercare following surgery for neoplasm: Secondary | ICD-10-CM

## 2021-06-02 DIAGNOSIS — M25611 Stiffness of right shoulder, not elsewhere classified: Secondary | ICD-10-CM

## 2021-06-02 DIAGNOSIS — Z170 Estrogen receptor positive status [ER+]: Secondary | ICD-10-CM

## 2021-06-02 DIAGNOSIS — C50411 Malignant neoplasm of upper-outer quadrant of right female breast: Secondary | ICD-10-CM

## 2021-06-02 DIAGNOSIS — R293 Abnormal posture: Secondary | ICD-10-CM

## 2021-06-02 DIAGNOSIS — M25511 Pain in right shoulder: Secondary | ICD-10-CM

## 2021-06-02 NOTE — Therapy (Signed)
[place], [place], [address] Phone: [phone]   Fax:  [phone]  Physical Therapy Treatment  Patient Details  Name: Melody Rodriguez MRN: [id number] Date of Birth: 06/03/1976 Referring Provider (PT): Dr. [name]   Encounter Date: 06/02/2021   PT End of Session - 06/02/21 1216     Visit Number 8    Number of Visits 15    Date for PT Re-Evaluation 06/28/21    Authorization Type [employer] - needs auth after 12 visits    Authorization - Visit Number 7    Authorization - Number of Visits 12    PT Start Time 1108    PT Stop Time 1209    PT Time Calculation (min) 61 min    Activity Tolerance Patient tolerated treatment well    Behavior During Therapy [facility] for tasks assessed/performed             Past Medical History:  Diagnosis Date   Breast cancer ([place])    Family history of colon cancer     Past Surgical History:  Procedure Laterality Date   BREAST BIOPSY Right 02/18/2021   Procedure: EVACUATION HEMATOMA RIGHT AXILLA;  Surgeon: [name], MD;  Location: [place];  Service: General;  Laterality: Right;   BREAST CYST EXCISION Right    Patient does not recall (2014 or 2015)   BREAST LUMPECTOMY WITH RADIOACTIVE SEED AND SENTINEL LYMPH NODE BIOPSY Right 02/07/2021   Procedure: RIGHT BREAST LUMPECTOMY WITH RADIOACTIVE SEED AND SEED TARGETED LYMPH NODE BIOPSY AND SENTINEL LYMPH NODE BIOPSY;  Surgeon: [name], MD;  Location: [place];  Service: General;  Laterality: Right;   CESAREAN SECTION     IR IMAGING GUIDED PORT INSERTION  09/15/2020   IR REMOVAL TUN CV CATH W/O FL  09/15/2020   NODE DISSECTION Right 03/28/2021   Procedure: RIGHT AXILLARY LYMPH NODE DISSECTION;  Surgeon: [name], MD;  Location: [place];  Service: General;  Laterality: Right;   PORT-A-CATH REMOVAL Left 02/07/2021   Procedure: REMOVAL  PORT-A-CATH;  Surgeon: [name], MD;  Location: [place];  Service: General;  Laterality: Left;   THYROIDECTOMY, PARTIAL      There were no vitals filed for this visit.   Subjective Assessment - 06/02/21 1110     Subjective I've started wearing my compression bra again. My arm just still feels heavy.    Pertinent History Patient was diagnosed on 07/13/2020 with right grade 2 invasive ductal carcinoma breast cancer. It is ER/PR positive and HER2 negative with a Ki67 of 20%. Patient underwent neoadjuvant chemotherapy from 09/01/2020 - 01/14/2021. She had a right lumpectomy and sentinel node biopsy (1/1 nodes positive) on 02/07/2021. Hematoma evacuation on 02/18/2021. Axillary dissection on 03/28/2021 with 3/7 positive nodes. Iron deficiency. Radiation starts 05/10/2021    Patient Stated Goals Get my arm better    Currently in Pain? Yes    Pain Score 5     Pain Location Scapula    Pain Orientation Right    Pain Descriptors / Indicators Aching    Pain Type Acute pain    Pain Onset More than a month ago    Pain Frequency Intermittent    Aggravating Factors  deep breath    Pain Relieving Factors just hurts all the time  [place] Adult PT Treatment/Exercise - 06/02/21 0001       Manual Therapy   Myofascial Release Gently to pts tolerance to Rt axilla, cording palpable here    Manual Lymphatic Drainage (MLD) In Supine: Short neck, superficial and deep abdominals, Lt axillary nodes, Lt intact upper quadrant, and anterior inter-axillary anastomosis, Rt inguinal nodes and Rt axillo-inguinal anastomosis, then focused on Rt breast redirecting towards anastomosis; next into Lt sidelying for extra time on posterior interaxillary anastamosis and fullness at posterior axilla, then finished retracing steps in supine    Passive ROM In Supine to Rt shoulder into flexion, abduction and D2 to pts tolerance; pts end P/ROM was improved since  this therapist saw her last and her muscle guarding was much improved as well                          PT Long Term Goals - 05/31/21 1117       PT LONG TERM GOAL #1   Title Patient will demonstrate she has regained full shoulder ROM and function post operatively compared to baselines.    Time 4    Period Weeks    Status On-going      PT LONG TERM GOAL #2   Title Patient will increase right shoulder active flexion to >/= 120 degrees for inceased ease reaching overhead.    Baseline 78 post op; 05/31/21- 139    Time 4    Period Weeks    Status Achieved      PT LONG TERM GOAL #3   Title Patient will increase her right shoulder active abduction to >/= 130 degrees to obtain radiation positioning.    Baseline 80 degrees; 05/31/21- 137    Time 4    Period Weeks    Status Achieved      PT LONG TERM GOAL #4   Title Patient will improve her DASH score to </= 53 for improved arm function.    Baseline 78; 05/31/21- 50    Time 4    Period Weeks    Status Achieved      PT LONG TERM GOAL #5   Title Patient will report she has a good understanding of lymphedema risk reduction practices.    Baseline No knowledge; 05/31/21- educated pt on this today and issued handout    Time 4    Period Weeks    Status On-going      Additional Long Term Goals   Additional Long Term Goals Yes      PT LONG TERM GOAL #6   Title Pt will be independent in a home exercise program for continued strengthening and stretching.    Time 4    Period Weeks    Status New    Target Date 06/28/21      PT LONG TERM GOAL #7   Title Pt will report a 50% improvement in pain to allow her to clean the bathroom and do ADLs with improved comfort.    Baseline 7/10    Time 4    Period Weeks    Status New    Target Date 06/28/21      PT LONG TERM GOAL #8   Title Pt will be independent in self MLD for long term management of edema.    Time 4    Period Weeks    Status New    Target Date 06/28/21      PT  LONG TERM GOAL  #9  TITLE Pt will be able to reach up in to the cabinet with her right arm to put up dishes to allow her to return to prior level of function.    Baseline uses L UE    Time 4    Period Weeks    Status New    Target Date 06/28/21                   Plan - 06/02/21 1217     Clinical Impression Statement Resumed manual therapy to Rt upper quadrant working to decease fascial restrictions from cording at Rt axilla. Also included manual lymph drainage to Rt breast as pt is now presenting with Rt breast edema as she is currently undergoing radiation.    Personal Factors and Comorbidities Comorbidity 3+    Comorbidities muliple surgeries, chemotherapy    Examination-Activity Limitations Reach Overhead;Lift;Carry    Examination-Participation Restrictions Occupation    Stability/Clinical Decision Making Stable/Uncomplicated    Rehab Potential Good    PT Frequency 2x / week    PT Duration 4 weeks    PT Treatment/Interventions Therapeutic exercise;Patient/family education;Manual techniques;Manual lymph drainage;Scar mobilization;Passive range of motion;ADLs/Self Care Home Management;Orthotic Fit/Training;Vasopneumatic Device;Taping    PT Next Visit Plan Cont PROM of Rt shoulder and manual therapy to Rt upper quadrant; AAROM exercises with pulleys and ball roll up wall, MLD for R breast and instruct pt and issued handout,  measure for compression sleeve?/ cont every 3 month L-Dex screens for up to 2 years from her SLNB    PT Home Exercise Plan Post op shoulder ROM HEP, supine dowel exercises    Consulted and Agree with Plan of Care Patient             Patient will benefit from skilled therapeutic intervention in order to improve the following deficits and impairments:  Postural dysfunction, Decreased range of motion, Pain, Impaired UE functional use, Decreased knowledge of precautions, Increased fascial restricitons, Decreased scar mobility, Increased edema, Decreased  strength  Visit Diagnosis: Lymphedema, not elsewhere classified  Disorder of the skin and subcutaneous tissue related to radiation, unspecified  Aftercare following surgery for neoplasm  Stiffness of right shoulder, not elsewhere classified  Acute pain of right shoulder  Abnormal posture  Malignant neoplasm of upper-outer quadrant of right breast in female, estrogen receptor positive ([place])     Problem List Patient Active Problem List   Diagnosis Date Noted   Bartholin cyst 05/06/2021   Amenorrhea, secondary 03/02/2021   PICC (peripherally inserted central catheter) in place 09/09/2020   Genetic testing 08/11/2020   Iron deficiency anemia due to chronic blood loss 08/04/2020   Family history of colon cancer    Malignant neoplasm of upper-outer quadrant of right breast in female, estrogen receptor positive ([place]) 07/28/2020    Otelia Limes, PTA 06/02/2021, 12:20 PM  [place], [place], [address] Phone: [phone]   Fax:  [phone]  Name: Tammera Engert MRN: [id number] Date of Birth: [birth year]

## 2021-06-03 ENCOUNTER — Other Ambulatory Visit: Payer: Self-pay

## 2021-06-03 ENCOUNTER — Ambulatory Visit
Admission: RE | Admit: 2021-06-03 | Discharge: 2021-06-03 | Disposition: A | Discharge status: Home / Self Care | Payer: Medicaid Other | Source: Ambulatory Visit | Attending: Radiation Oncology | Admitting: Radiation Oncology

## 2021-06-03 DIAGNOSIS — I890 Lymphedema, not elsewhere classified: Secondary | ICD-10-CM | POA: Diagnosis not present

## 2021-06-06 ENCOUNTER — Ambulatory Visit
Admission: RE | Admit: 2021-06-06 | Discharge: 2021-06-06 | Disposition: A | Discharge status: Home / Self Care | Payer: Medicaid Other | Source: Ambulatory Visit | Attending: Radiation Oncology | Admitting: Radiation Oncology

## 2021-06-06 ENCOUNTER — Ambulatory Visit: Payer: Medicaid Other

## 2021-06-06 ENCOUNTER — Other Ambulatory Visit: Payer: Self-pay

## 2021-06-06 DIAGNOSIS — Z170 Estrogen receptor positive status [ER+]: Secondary | ICD-10-CM

## 2021-06-06 DIAGNOSIS — C50411 Malignant neoplasm of upper-outer quadrant of right female breast: Secondary | ICD-10-CM

## 2021-06-06 DIAGNOSIS — Z483 Aftercare following surgery for neoplasm: Secondary | ICD-10-CM

## 2021-06-06 DIAGNOSIS — R293 Abnormal posture: Secondary | ICD-10-CM

## 2021-06-06 DIAGNOSIS — I890 Lymphedema, not elsewhere classified: Secondary | ICD-10-CM | POA: Diagnosis not present

## 2021-06-06 DIAGNOSIS — L599 Disorder of the skin and subcutaneous tissue related to radiation, unspecified: Secondary | ICD-10-CM

## 2021-06-06 DIAGNOSIS — M25511 Pain in right shoulder: Secondary | ICD-10-CM

## 2021-06-06 DIAGNOSIS — M25611 Stiffness of right shoulder, not elsewhere classified: Secondary | ICD-10-CM

## 2021-06-06 NOTE — Therapy (Signed)
[place], [place], [address] Phone: [phone]   Fax:  [phone]  Physical Therapy Treatment  Patient Details  Name: Melody Rodriguez MRN: [id number] Date of Birth: 1975/10/29 Referring Provider (PT): Dr. [name]   Encounter Date: 06/06/2021   PT End of Session - 06/06/21 1233     Visit Number 9    Number of Visits 15    Date for PT Re-Evaluation 06/28/21    Authorization Type [employer] - needs auth after 12 visits    Authorization - Visit Number 8    Authorization - Number of Visits 12    PT Start Time 1009    PT Stop Time 1104    PT Time Calculation (min) 55 min    Activity Tolerance Patient tolerated treatment well    Behavior During Therapy [facility] for tasks assessed/performed             Past Medical History:  Diagnosis Date   Breast cancer ([place])    Family history of colon cancer     Past Surgical History:  Procedure Laterality Date   BREAST BIOPSY Right 02/18/2021   Procedure: EVACUATION HEMATOMA RIGHT AXILLA;  Surgeon: [name], MD;  Location: [place];  Service: General;  Laterality: Right;   BREAST CYST EXCISION Right    Patient does not recall (2014 or 2015)   BREAST LUMPECTOMY WITH RADIOACTIVE SEED AND SENTINEL LYMPH NODE BIOPSY Right 02/07/2021   Procedure: RIGHT BREAST LUMPECTOMY WITH RADIOACTIVE SEED AND SEED TARGETED LYMPH NODE BIOPSY AND SENTINEL LYMPH NODE BIOPSY;  Surgeon: [name], MD;  Location: [facility];  Service: General;  Laterality: Right;   CESAREAN SECTION     IR IMAGING GUIDED PORT INSERTION  09/15/2020   IR REMOVAL TUN CV CATH W/O FL  09/15/2020   NODE DISSECTION Right 03/28/2021   Procedure: RIGHT AXILLARY LYMPH NODE DISSECTION;  Surgeon: [name], MD;  Location: [place];  Service: General;  Laterality: Right;   PORT-A-CATH REMOVAL Left 02/07/2021   Procedure: REMOVAL  PORT-A-CATH;  Surgeon: [name], MD;  Location: [facility];  Service: General;  Laterality: Left;   THYROIDECTOMY, PARTIAL      There were no vitals filed for this visit.   Subjective Assessment - 06/06/21 1010     Subjective I've been busy packing getting ready to move so that has made my Rt inner upper arm hurt a little more today. But I like wearing the TG soft sleeve, that seems to help.    Pertinent History Patient was diagnosed on 07/13/2020 with right grade 2 invasive ductal carcinoma breast cancer. It is ER/PR positive and HER2 negative with a Ki67 of 20%. Patient underwent neoadjuvant chemotherapy from 09/01/2020 - 01/14/2021. She had a right lumpectomy and sentinel node biopsy (1/1 nodes positive) on 02/07/2021. Hematoma evacuation on 02/18/2021. Axillary dissection on 03/28/2021 with 3/7 positive nodes. Iron deficiency. Radiation starts 05/10/2021    Patient Stated Goals Get my arm better    Currently in Pain? Yes    Pain Score 5     Pain Location Arm    Pain Orientation Right;Upper    Pain Descriptors / Indicators Stabbing    Pain Type Surgical pain    Pain Onset More than a month ago    Pain Frequency Intermittent    Aggravating Factors  didn't wear my TG soft enough over the weekend and I've been packing also so doing more  Pain Relieving Factors stetching helps some                               [facility] Adult PT Treatment/Exercise - 06/06/21 0001       Shoulder Exercises: Pulleys   Flexion 2 minutes    Flexion Limitations VCs to remind pt to hold stretch a few seconds    ABduction 2 minutes      Shoulder Exercises: Therapy Ball   Flexion Both;10 reps   forward lean into end of stretch   Flexion Limitations Tactile cues to decrease Rt scapular compensation      Shoulder Exercises: Stretch   Table Stretch - Abduction 5 reps   Rt UE with finger ladder   Other Shoulder Stretches --      Manual Therapy   Myofascial Release  Gently to pts tolerance to Rt axilla, cording palpable here    Manual Lymphatic Drainage (MLD) In Supine: Short neck, superficial and deep abdominals, Lt axillary nodes, Lt intact upper quadrant, and anterior inter-axillary anastomosis, Rt inguinal nodes and Rt axillo-inguinal anastomosis, then focused on Rt breast redirecting towards anastomosis; next into Lt sidelying for extra time on posterior interaxillary anastamosis and fullness at posterior axilla, then finished retracing steps in supine    Passive ROM In Supine to Rt shoulder into flexion, abduction and D2 to pts tolerance                          PT Long Term Goals - 05/31/21 1117       PT LONG TERM GOAL #1   Title Patient will demonstrate she has regained full shoulder ROM and function post operatively compared to baselines.    Time 4    Period Weeks    Status On-going      PT LONG TERM GOAL #2   Title Patient will increase right shoulder active flexion to >/= 120 degrees for inceased ease reaching overhead.    Baseline 78 post op; 05/31/21- 139    Time 4    Period Weeks    Status Achieved      PT LONG TERM GOAL #3   Title Patient will increase her right shoulder active abduction to >/= 130 degrees to obtain radiation positioning.    Baseline 80 degrees; 05/31/21- 137    Time 4    Period Weeks    Status Achieved      PT LONG TERM GOAL #4   Title Patient will improve her DASH score to </= 53 for improved arm function.    Baseline 78; 05/31/21- 50    Time 4    Period Weeks    Status Achieved      PT LONG TERM GOAL #5   Title Patient will report she has a good understanding of lymphedema risk reduction practices.    Baseline No knowledge; 05/31/21- educated pt on this today and issued handout    Time 4    Period Weeks    Status On-going      Additional Long Term Goals   Additional Long Term Goals Yes      PT LONG TERM GOAL #6   Title Pt will be independent in a home exercise program for continued  strengthening and stretching.    Time 4    Period Weeks    Status New    Target Date 06/28/21      PT  LONG TERM GOAL #7   Title Pt will report a 50% improvement in pain to allow her to clean the bathroom and do ADLs with improved comfort.    Baseline 7/10    Time 4    Period Weeks    Status New    Target Date 06/28/21      PT LONG TERM GOAL #8   Title Pt will be independent in self MLD for long term management of edema.    Time 4    Period Weeks    Status New    Target Date 06/28/21      PT LONG TERM GOAL  #9   TITLE Pt will be able to reach up in to the cabinet with her right arm to put up dishes to allow her to return to prior level of function.    Baseline uses L UE    Time 4    Period Weeks    Status New    Target Date 06/28/21                   Plan - 06/06/21 1234     Clinical Impression Statement Continued with AA/ROM and pt still requires mod tactile and VCs to decrease Rt scapular compensation though is able to correct this with cuing. She is still guarded with movements allowing for these compensations. Pt is aware of this and working to correct this and has improved with this since beginning therapy as her guarding is less. Also continued with maual therapy working to decrease fascial restrictions from cording at axilla that is limiting her end P/A/ROM. Pt reports noticing overall that she is able to lift her arm higher since starting physical therapy. Her breast lymphedema is still present though does reduce some with MLD. Cautioned pt that if her skin gets hypersensitive and/or begins to blister due to radiation we will need to place her on hold until she finishes radiation to allow for her skin to heal. Pt verbalized understanding this. Issued another TG soft, size med, for pt to wear on her Rt UE as she reports this has been giving her great relief from her cording symptoms.    Personal Factors and Comorbidities Comorbidity 3+    Comorbidities muliple  surgeries, chemotherapy    Examination-Activity Limitations Reach Overhead;Lift;Carry    Examination-Participation Restrictions Occupation    Stability/Clinical Decision Making Stable/Uncomplicated    Rehab Potential Good    PT Frequency 2x / week    PT Duration 4 weeks    PT Treatment/Interventions Therapeutic exercise;Patient/family education;Manual techniques;Manual lymph drainage;Scar mobilization;Passive range of motion;ADLs/Self Care Home Management;Orthotic Fit/Training;Vasopneumatic Device;Taping    PT Next Visit Plan Cont PROM of Rt shoulder and manual therapy to Rt upper quadrant; AAROM exercises with pulleys and ball roll up wall, MLD for R breast and instruct pt and issued handout,  measure for compression sleeve?/ cont every 3 month L-Dex screens for up to 2 years from her SLNB    PT Home Exercise Plan Post op shoulder ROM HEP, supine dowel exercises    Consulted and Agree with Plan of Care Patient             Patient will benefit from skilled therapeutic intervention in order to improve the following deficits and impairments:  Postural dysfunction, Decreased range of motion, Pain, Impaired UE functional use, Decreased knowledge of precautions, Increased fascial restricitons, Decreased scar mobility, Increased edema, Decreased strength  Visit Diagnosis: Lymphedema, not elsewhere classified  Disorder of the skin  and subcutaneous tissue related to radiation, unspecified  Aftercare following surgery for neoplasm  Stiffness of right shoulder, not elsewhere classified  Acute pain of right shoulder  Abnormal posture  Malignant neoplasm of upper-outer quadrant of right breast in female, estrogen receptor positive ([place])     Problem List Patient Active Problem List   Diagnosis Date Noted   Bartholin cyst 05/06/2021   Amenorrhea, secondary 03/02/2021   PICC (peripherally inserted central catheter) in place 09/09/2020   Genetic testing 08/11/2020   Iron deficiency  anemia due to chronic blood loss 08/04/2020   Family history of colon cancer    Malignant neoplasm of upper-outer quadrant of right breast in female, estrogen receptor positive ([place]) 07/28/2020    Otelia Limes, PTA 06/06/2021, 12:39 PM  [place], [place], [address] Phone: [phone]   Fax:  [phone]  Name: Melody Rodriguez MRN: [id number] Date of Birth: 08/07/1976

## 2021-06-07 ENCOUNTER — Ambulatory Visit
Admission: RE | Admit: 2021-06-07 | Discharge: 2021-06-07 | Disposition: A | Discharge status: Home / Self Care | Payer: Medicaid Other | Source: Ambulatory Visit | Attending: Radiation Oncology | Admitting: Radiation Oncology

## 2021-06-07 DIAGNOSIS — I890 Lymphedema, not elsewhere classified: Secondary | ICD-10-CM | POA: Diagnosis not present

## 2021-06-08 ENCOUNTER — Other Ambulatory Visit: Payer: Self-pay

## 2021-06-08 ENCOUNTER — Ambulatory Visit: Payer: Medicaid Other

## 2021-06-08 ENCOUNTER — Ambulatory Visit
Admission: RE | Admit: 2021-06-08 | Discharge: 2021-06-08 | Disposition: A | Discharge status: Home / Self Care | Payer: Medicaid Other | Source: Ambulatory Visit | Attending: Radiation Oncology | Admitting: Radiation Oncology

## 2021-06-08 DIAGNOSIS — R293 Abnormal posture: Secondary | ICD-10-CM

## 2021-06-08 DIAGNOSIS — I890 Lymphedema, not elsewhere classified: Secondary | ICD-10-CM | POA: Diagnosis not present

## 2021-06-08 DIAGNOSIS — M25511 Pain in right shoulder: Secondary | ICD-10-CM

## 2021-06-08 DIAGNOSIS — Z170 Estrogen receptor positive status [ER+]: Secondary | ICD-10-CM

## 2021-06-08 DIAGNOSIS — C50411 Malignant neoplasm of upper-outer quadrant of right female breast: Secondary | ICD-10-CM

## 2021-06-08 DIAGNOSIS — M25611 Stiffness of right shoulder, not elsewhere classified: Secondary | ICD-10-CM

## 2021-06-08 DIAGNOSIS — Z483 Aftercare following surgery for neoplasm: Secondary | ICD-10-CM

## 2021-06-08 DIAGNOSIS — L599 Disorder of the skin and subcutaneous tissue related to radiation, unspecified: Secondary | ICD-10-CM

## 2021-06-08 IMAGING — XA IR PICC >5YO
1 series · 1 of 1 positions shown · IV contrast (agent unspecified)
Comparison: none

INDICATION: Patient with newly diagnosed breast cancer presents today for PICC
placement to initiate chemotherapy.

EXAM:
ULTRASOUND AND FLUOROSCOPIC GUIDED PICC LINE INSERTION
MEDICATIONS:
1% lidocaine, 3 mL
CONTRAST:  None
FLUOROSCOPY TIME:  48 seconds (9 mGy)
COMPLICATIONS:
None immediate.
TECHNIQUE: The procedure, risks, benefits, and alternatives were explained to
the patient and informed written consent was obtained.

[Series 300: ir fluoro guide cv line left · 1 of 1 slices shown]
[im 1/1]
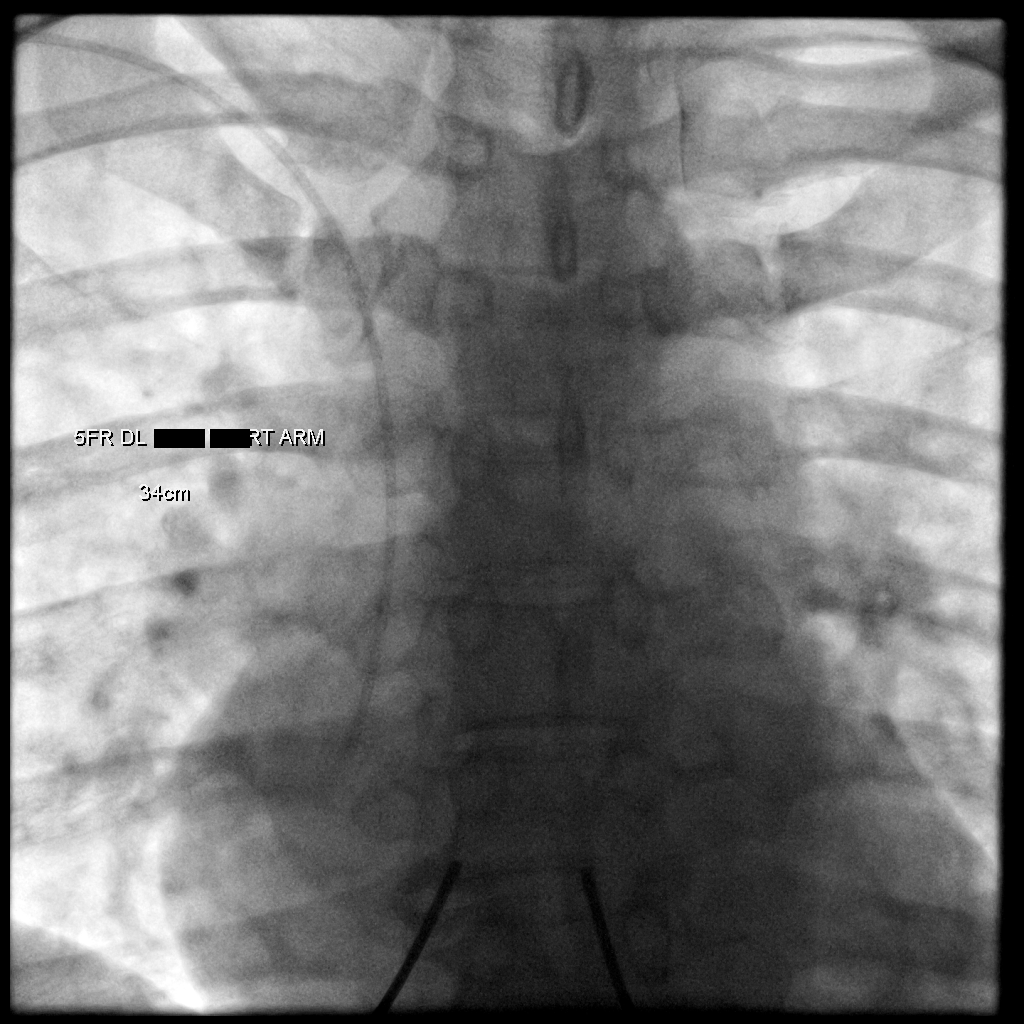

[1 of 1 positions shown; findings below may reference images not displayed]

The right upper extremity was prepped with chlorhexidine in a
sterile fashion, and a sterile drape was applied covering the
operative field. Maximum barrier sterile technique with sterile
gowns and gloves were used for the procedure. A timeout was
performed prior to the initiation of the procedure. Local anesthesia
was provided with 1% lidocaine.

After the overlying soft tissues were anesthetized with 1%
lidocaine, a micropuncture kit was utilized to access the right
brachial vein. Real-time ultrasound guidance was utilized for
vascular access including the acquisition of a permanent ultrasound
image documenting patency of the accessed vessel.

A guidewire was advanced to the level of the superior caval-atrial
junction for measurement purposes and the PICC line was cut to
length. A peel-away sheath was placed and a 34 cm, 5 French, dual
lumen was inserted to level of the superior caval-atrial junction. A
post procedure spot fluoroscopic was obtained. The catheter easily
aspirated and flushed and was secured in place. A dressing was
placed. The patient tolerated the procedure well without immediate
post procedural complication.
FINDINGS: After catheter placement, the tip lies within the superior
cavoatrial junction. The catheter aspirates and flushes normally and
is ready for immediate use.
IMPRESSION: Successful ultrasound and fluoroscopic guided placement of a right
brachial vein approach, 34 cm, 5 French, dual lumen PICC with tip at
the superior caval-atrial junction. The PICC line is ready for
immediate use. Read by: Blade Aujla, NP

## 2021-06-08 NOTE — Therapy (Signed)
[place], [place], [address] Phone: [phone]   Fax:  [phone]  Physical Therapy Treatment  Patient Details  Name: Melody Rodriguez MRN: [id number] Date of Birth: 01-06-1976 Referring Provider (PT): Dr. [name]   Encounter Date: 06/08/2021   PT End of Session - 06/08/21 1104     Visit Number 10    Number of Visits 15    Date for PT Re-Evaluation 06/28/21    Authorization Type [employer] - needs auth after 12 visits    Authorization - Visit Number 10    Authorization - Number of Visits 12    PT Start Time 1007    PT Stop Time 1103    PT Time Calculation (min) 56 min    Activity Tolerance Patient tolerated treatment well    Behavior During Therapy [facility] for tasks assessed/performed             Past Medical History:  Diagnosis Date   Breast cancer ([place])    Family history of colon cancer     Past Surgical History:  Procedure Laterality Date   BREAST BIOPSY Right 02/18/2021   Procedure: EVACUATION HEMATOMA RIGHT AXILLA;  Surgeon: [name], MD;  Location: [facility];  Service: General;  Laterality: Right;   BREAST CYST EXCISION Right    Patient does not recall (2014 or 2015)   BREAST LUMPECTOMY WITH RADIOACTIVE SEED AND SENTINEL LYMPH NODE BIOPSY Right 02/07/2021   Procedure: RIGHT BREAST LUMPECTOMY WITH RADIOACTIVE SEED AND SEED TARGETED LYMPH NODE BIOPSY AND SENTINEL LYMPH NODE BIOPSY;  Surgeon: [name], MD;  Location: [facility];  Service: General;  Laterality: Right;   CESAREAN SECTION     IR IMAGING GUIDED PORT INSERTION  09/15/2020   IR REMOVAL TUN CV CATH W/O FL  09/15/2020   NODE DISSECTION Right 03/28/2021   Procedure: RIGHT AXILLARY LYMPH NODE DISSECTION;  Surgeon: [name], MD;  Location: [facility];  Service: General;  Laterality: Right;   PORT-A-CATH REMOVAL Left 02/07/2021   Procedure: REMOVAL  PORT-A-CATH;  Surgeon: [name], MD;  Location: [facility];  Service: General;  Laterality: Left;   THYROIDECTOMY, PARTIAL      There were no vitals filed for this visit.   Subjective Assessment - 06/08/21 1012     Subjective I got confirmation that my moving date is next Friday, 9/30. So I've been busy packing and getting ready to move.    Pertinent History Patient was diagnosed on 07/13/2020 with right grade 2 invasive ductal carcinoma breast cancer. It is ER/PR positive and HER2 negative with a Ki67 of 20%. Patient underwent neoadjuvant chemotherapy from 09/01/2020 - 01/14/2021. She had a right lumpectomy and sentinel node biopsy (1/1 nodes positive) on 02/07/2021. Hematoma evacuation on 02/18/2021. Axillary dissection on 03/28/2021 with 3/7 positive nodes. Iron deficiency. Radiation starts 05/10/2021    Patient Stated Goals Get my arm better    Currently in Pain? Yes    Pain Score 6     Pain Location Axilla    Pain Orientation Right    Pain Type Surgical pain    Pain Radiating Towards lateral trunk    Pain Onset More than a month ago    Pain Frequency Intermittent    Aggravating Factors  been more active due to packing    Pain Relieving Factors stretching and rest  [facility] Adult PT Treatment/Exercise - 06/08/21 0001       Shoulder Exercises: Pulleys   Flexion 3 minutes    ABduction 2 minutes      Shoulder Exercises: Therapy Ball   Flexion Both;10 reps   forward lean into end of stretch     Shoulder Exercises: Stretch   Table Stretch - Abduction 5 reps   on finger ladder with Rt UE     Manual Therapy   Myofascial Release Gently to pts tolerance to Rt axilla, cording palpable here    Manual Lymphatic Drainage (MLD) In Supine: Short neck, superficial and deep abdominals, Lt axillary nodes, Lt intact upper quadrant, and anterior inter-axillary anastomosis, Rt inguinal nodes and Rt axillo-inguinal anastomosis, then  focused on Rt breast redirecting towards anastomosis; next into Lt sidelying for extra time on posterior interaxillary anastamosis and fullness at posterior axilla, then finished retracing steps in supine    Passive ROM In Supine to Rt shoulder into flexion, abduction and D2 to pts tolerance                          PT Long Term Goals - 05/31/21 1117       PT LONG TERM GOAL #1   Title Patient will demonstrate she has regained full shoulder ROM and function post operatively compared to baselines.    Time 4    Period Weeks    Status On-going      PT LONG TERM GOAL #2   Title Patient will increase right shoulder active flexion to >/= 120 degrees for inceased ease reaching overhead.    Baseline 78 post op; 05/31/21- 139    Time 4    Period Weeks    Status Achieved      PT LONG TERM GOAL #3   Title Patient will increase her right shoulder active abduction to >/= 130 degrees to obtain radiation positioning.    Baseline 80 degrees; 05/31/21- 137    Time 4    Period Weeks    Status Achieved      PT LONG TERM GOAL #4   Title Patient will improve her DASH score to </= 53 for improved arm function.    Baseline 78; 05/31/21- 50    Time 4    Period Weeks    Status Achieved      PT LONG TERM GOAL #5   Title Patient will report she has a good understanding of lymphedema risk reduction practices.    Baseline No knowledge; 05/31/21- educated pt on this today and issued handout    Time 4    Period Weeks    Status On-going      Additional Long Term Goals   Additional Long Term Goals Yes      PT LONG TERM GOAL #6   Title Pt will be independent in a home exercise program for continued strengthening and stretching.    Time 4    Period Weeks    Status New    Target Date 06/28/21      PT LONG TERM GOAL #7   Title Pt will report a 50% improvement in pain to allow her to clean the bathroom and do ADLs with improved comfort.    Baseline 7/10    Time 4    Period Weeks     Status New    Target Date 06/28/21      PT LONG TERM GOAL #8   Title Pt will  be independent in self MLD for long term management of edema.    Time 4    Period Weeks    Status New    Target Date 06/28/21      PT LONG TERM GOAL  #9   TITLE Pt will be able to reach up in to the cabinet with her right arm to put up dishes to allow her to return to prior level of function.    Baseline uses L UE    Time 4    Period Weeks    Status New    Target Date 06/28/21                   Plan - 06/08/21 1105     Clinical Impression Statement Continued with AA/ROM and pt required less cuing for technique today. Also continued manual therapy to Rt upper quadrant. Her end P/ROM is slowly improving with each session, though her tolerance remains limited at end motions where she is palpably still very tight and restricted by fascia tightness in axilla. Her breast lymphedema, per pt, comes and goes but does soften some after MLD.    Personal Factors and Comorbidities Comorbidity 3+    Comorbidities muliple surgeries, chemotherapy    Examination-Activity Limitations Reach Overhead;Lift;Carry    Examination-Participation Restrictions Occupation    Stability/Clinical Decision Making Stable/Uncomplicated    Rehab Potential Good    PT Frequency 2x / week    PT Duration 4 weeks    PT Treatment/Interventions Therapeutic exercise;Patient/family education;Manual techniques;Manual lymph drainage;Scar mobilization;Passive range of motion;ADLs/Self Care Home Management;Orthotic Fit/Training;Vasopneumatic Device;Taping    PT Next Visit Plan Try to encourage pt to wear a sport bra (compression bra?) Cont PROM of Rt shoulder and manual therapy to Rt upper quadrant; also cont MLD to Rt breast and instruct pt in this, issuing handout; AAROM exercises with pulleys and ball roll up wall, measure for compression sleeve?/ cont every 3 month L-Dex screens for up to 2 years from her SLNB    PT Home Exercise Plan Post op  shoulder ROM HEP, supine dowel exercises    Consulted and Agree with Plan of Care Patient             Patient will benefit from skilled therapeutic intervention in order to improve the following deficits and impairments:  Postural dysfunction, Decreased range of motion, Pain, Impaired UE functional use, Decreased knowledge of precautions, Increased fascial restricitons, Decreased scar mobility, Increased edema, Decreased strength  Visit Diagnosis: Lymphedema, not elsewhere classified  Disorder of the skin and subcutaneous tissue related to radiation, unspecified  Aftercare following surgery for neoplasm  Stiffness of right shoulder, not elsewhere classified  Acute pain of right shoulder  Abnormal posture  Malignant neoplasm of upper-outer quadrant of right breast in female, estrogen receptor positive ([place])     Problem List Patient Active Problem List   Diagnosis Date Noted   Bartholin cyst 05/06/2021   Amenorrhea, secondary 03/02/2021   PICC (peripherally inserted central catheter) in place 09/09/2020   Genetic testing 08/11/2020   Iron deficiency anemia due to chronic blood loss 08/04/2020   Family history of colon cancer    Malignant neoplasm of upper-outer quadrant of right breast in female, estrogen receptor positive ([place]) 07/28/2020    Otelia Limes, PTA 06/08/2021, 12:38 PM  [place], [place], [address] Phone: [phone]   Fax:  [phone]  Name: Melody Rodriguez MRN: [id number] Date of Birth: [birth year]

## 2021-06-09 ENCOUNTER — Ambulatory Visit
Admission: RE | Admit: 2021-06-09 | Discharge: 2021-06-09 | Disposition: A | Discharge status: Home / Self Care | Payer: Medicaid Other | Source: Ambulatory Visit | Attending: Radiation Oncology | Admitting: Radiation Oncology

## 2021-06-09 DIAGNOSIS — I890 Lymphedema, not elsewhere classified: Secondary | ICD-10-CM | POA: Diagnosis not present

## 2021-06-10 ENCOUNTER — Ambulatory Visit
Admission: RE | Admit: 2021-06-10 | Discharge: 2021-06-10 | Disposition: A | Discharge status: Home / Self Care | Payer: Medicaid Other | Source: Ambulatory Visit | Attending: Radiation Oncology | Admitting: Radiation Oncology

## 2021-06-10 ENCOUNTER — Other Ambulatory Visit: Payer: Self-pay

## 2021-06-10 DIAGNOSIS — I890 Lymphedema, not elsewhere classified: Secondary | ICD-10-CM | POA: Diagnosis not present

## 2021-06-13 ENCOUNTER — Ambulatory Visit: Payer: Medicaid Other

## 2021-06-13 ENCOUNTER — Ambulatory Visit
Admission: RE | Admit: 2021-06-13 | Discharge: 2021-06-13 | Disposition: A | Discharge status: Home / Self Care | Payer: Medicaid Other | Source: Ambulatory Visit | Attending: Radiation Oncology | Admitting: Radiation Oncology

## 2021-06-13 ENCOUNTER — Other Ambulatory Visit: Payer: Self-pay

## 2021-06-13 DIAGNOSIS — Z483 Aftercare following surgery for neoplasm: Secondary | ICD-10-CM

## 2021-06-13 DIAGNOSIS — M25511 Pain in right shoulder: Secondary | ICD-10-CM

## 2021-06-13 DIAGNOSIS — R293 Abnormal posture: Secondary | ICD-10-CM

## 2021-06-13 DIAGNOSIS — M25611 Stiffness of right shoulder, not elsewhere classified: Secondary | ICD-10-CM

## 2021-06-13 DIAGNOSIS — L599 Disorder of the skin and subcutaneous tissue related to radiation, unspecified: Secondary | ICD-10-CM

## 2021-06-13 DIAGNOSIS — Z170 Estrogen receptor positive status [ER+]: Secondary | ICD-10-CM

## 2021-06-13 DIAGNOSIS — I890 Lymphedema, not elsewhere classified: Secondary | ICD-10-CM

## 2021-06-13 DIAGNOSIS — C50411 Malignant neoplasm of upper-outer quadrant of right female breast: Secondary | ICD-10-CM

## 2021-06-13 NOTE — Therapy (Signed)
[place], [place], [address] Phone: [phone]   Fax:  [phone]  Physical Therapy Treatment  Patient Details  Name: Melody Rodriguez MRN: [id number] Date of Birth: 1976/05/07 Referring Provider (PT): Dr. [name]   Encounter Date: 06/13/2021   PT End of Session - 06/13/21 1109     Visit Number 11    Number of Visits 15    Date for PT Re-Evaluation 06/28/21    Authorization Type [employer] - needs auth after 12 visits    Authorization - Visit Number 11    Authorization - Number of Visits 12    PT Start Time 1010    PT Stop Time 1104    PT Time Calculation (min) 54 min    Activity Tolerance Patient tolerated treatment well    Behavior During Therapy [facility] for tasks assessed/performed             Past Medical History:  Diagnosis Date   Breast cancer ([place])    Family history of colon cancer     Past Surgical History:  Procedure Laterality Date   BREAST BIOPSY Right 02/18/2021   Procedure: EVACUATION HEMATOMA RIGHT AXILLA;  Surgeon: [name], MD;  Location: [place];  Service: General;  Laterality: Right;   BREAST CYST EXCISION Right    Patient does not recall (2014 or 2015)   BREAST LUMPECTOMY WITH RADIOACTIVE SEED AND SENTINEL LYMPH NODE BIOPSY Right 02/07/2021   Procedure: RIGHT BREAST LUMPECTOMY WITH RADIOACTIVE SEED AND SEED TARGETED LYMPH NODE BIOPSY AND SENTINEL LYMPH NODE BIOPSY;  Surgeon: [name], MD;  Location: [place];  Service: General;  Laterality: Right;   CESAREAN SECTION     IR IMAGING GUIDED PORT INSERTION  09/15/2020   IR REMOVAL TUN CV CATH W/O FL  09/15/2020   NODE DISSECTION Right 03/28/2021   Procedure: RIGHT AXILLARY LYMPH NODE DISSECTION;  Surgeon: [name], MD;  Location: [place];  Service: General;  Laterality: Right;   PORT-A-CATH REMOVAL Left 02/07/2021   Procedure: REMOVAL  PORT-A-CATH;  Surgeon: [name], MD;  Location: [place];  Service: General;  Laterality: Left;   THYROIDECTOMY, PARTIAL      There were no vitals filed for this visit.   Subjective Assessment - 06/13/21 1014     Subjective Mt Rt chest is sore that I even feel it when I take a deep breath.    Pertinent History Patient was diagnosed on 07/13/2020 with right grade 2 invasive ductal carcinoma breast cancer. It is ER/PR positive and HER2 negative with a Ki67 of 20%. Patient underwent neoadjuvant chemotherapy from 09/01/2020 - 01/14/2021. She had a right lumpectomy and sentinel node biopsy (1/1 nodes positive) on 02/07/2021. Hematoma evacuation on 02/18/2021. Axillary dissection on 03/28/2021 with 3/7 positive nodes. Iron deficiency. Radiation starts 05/10/2021    Currently in Pain? Yes    Pain Score 7     Pain Location Chest    Pain Orientation Right    Pain Descriptors / Indicators Aching    Pain Type Surgical pain    Pain Onset More than a month ago    Pain Frequency Intermittent    Aggravating Factors  probably from getting ready to move    Pain Relieving Factors not much right now  [facility] Adult PT Treatment/Exercise - 06/13/21 0001       Shoulder Exercises: Pulleys   Flexion 2 minutes    Flexion Limitations VCs to remind pt to relax Rt shoulder today and to sit with erect posture    ABduction 2 minutes    ABduction Limitations VCs to remind pt to relax Rt shoulder      Manual Therapy   Soft tissue mobilization with lotion to Rt periscapular region and Rt upper trap when in Lt S/L    Myofascial Release Gently to pts tolerance to Rt axilla, cording palpable here    Scapular Mobilization In Lt S/L to Rt scapula into protraction and retraction, also depression during P/ROM of shoulder    Manual Lymphatic Drainage (MLD) In Supine: Short neck, superficial and deep abdominals, Lt axillary nodes, Lt intact upper quadrant,  and anterior inter-axillary anastomosis, Rt inguinal nodes and Rt axillo-inguinal anastomosis, then focused on Rt breast and UE redirecting towards anastomosis; next into Lt sidelying for extra time on posterior interaxillary anastamosis and fullness at posterior axilla, then finished retracing steps in supine    Passive ROM In Supine to Rt shoulder into flexion, abduction and D2 to pts tolerance; pt with near full P/ROM by end of session today and cording very mildly palpated only at end motions now in medial upper arm                          PT Long Term Goals - 05/31/21 1117       PT LONG TERM GOAL #1   Title Patient will demonstrate she has regained full shoulder ROM and function post operatively compared to baselines.    Time 4    Period Weeks    Status On-going      PT LONG TERM GOAL #2   Title Patient will increase right shoulder active flexion to >/= 120 degrees for inceased ease reaching overhead.    Baseline 78 post op; 05/31/21- 139    Time 4    Period Weeks    Status Achieved      PT LONG TERM GOAL #3   Title Patient will increase her right shoulder active abduction to >/= 130 degrees to obtain radiation positioning.    Baseline 80 degrees; 05/31/21- 137    Time 4    Period Weeks    Status Achieved      PT LONG TERM GOAL #4   Title Patient will improve her DASH score to </= 53 for improved arm function.    Baseline 78; 05/31/21- 50    Time 4    Period Weeks    Status Achieved      PT LONG TERM GOAL #5   Title Patient will report she has a good understanding of lymphedema risk reduction practices.    Baseline No knowledge; 05/31/21- educated pt on this today and issued handout    Time 4    Period Weeks    Status On-going      Additional Long Term Goals   Additional Long Term Goals Yes      PT LONG TERM GOAL #6   Title Pt will be independent in a home exercise program for continued strengthening and stretching.    Time 4    Period Weeks     Status New    Target Date 06/28/21      PT LONG TERM GOAL #7   Title Pt will report a 50% improvement  in pain to allow her to clean the bathroom and do ADLs with improved comfort.    Baseline 7/10    Time 4    Period Weeks    Status New    Target Date 06/28/21      PT LONG TERM GOAL #8   Title Pt will be independent in self MLD for long term management of edema.    Time 4    Period Weeks    Status New    Target Date 06/28/21      PT LONG TERM GOAL  #9   TITLE Pt will be able to reach up in to the cabinet with her right arm to put up dishes to allow her to return to prior level of function.    Baseline uses L UE    Time 4    Period Weeks    Status New    Target Date 06/28/21                   Plan - 06/13/21 1109     Clinical Impression Statement Pt comes in repotring feeling increased pain since last session in her Rt upper quadrant. Continued with AA/ROM but focused more on manual therapy today to help decrease tightness and pain pt was reporting. By end of session her Rt shoulder P/ROM was near full, espcially with flexion with arm almost on mat table. Also her cording was much less palpable and pt reprots the pull she had previously been feeling from cording was much improved to her as well. Encouraged her to be "sprinkling" her HEP end ROM stretches into her day (i.e. doorway stretches, reaching to high shelves, and wall stretches in shower before getting out as her muscles will be looser from the warm water). Pt verbalizes understanding all and reports will try to strat being more proactive about this more.    Personal Factors and Comorbidities Comorbidity 3+    Comorbidities muliple surgeries, chemotherapy    Examination-Activity Limitations Reach Overhead;Lift;Carry    Examination-Participation Restrictions Occupation    Stability/Clinical Decision Making Stable/Uncomplicated    Rehab Potential Good    PT Frequency 2x / week    PT Duration 4 weeks    PT  Treatment/Interventions Therapeutic exercise;Patient/family education;Manual techniques;Manual lymph drainage;Scar mobilization;Passive range of motion;ADLs/Self Care Home Management;Orthotic Fit/Training;Vasopneumatic Device;Taping    PT Next Visit Plan Try to encourage pt to wear a sport bra (compression bra?) Cont PROM of Rt shoulder and manual therapy to Rt upper quadrant; also cont MLD to Rt breast and instruct pt in this, issuing handout; AAROM exercises with pulleys and ball roll up wall, measure for compression sleeve?/ cont every 3 month L-Dex screens for up to 2 years from her SLNB    Consulted and Agree with Plan of Care Patient             Patient will benefit from skilled therapeutic intervention in order to improve the following deficits and impairments:  Postural dysfunction, Decreased range of motion, Pain, Impaired UE functional use, Decreased knowledge of precautions, Increased fascial restricitons, Decreased scar mobility, Increased edema, Decreased strength  Visit Diagnosis: Lymphedema, not elsewhere classified  Disorder of the skin and subcutaneous tissue related to radiation, unspecified  Aftercare following surgery for neoplasm  Stiffness of right shoulder, not elsewhere classified  Acute pain of right shoulder  Abnormal posture  Malignant neoplasm of upper-outer quadrant of right breast in female, estrogen receptor positive ([place])     Problem List Patient Active  Problem List   Diagnosis Date Noted   Bartholin cyst 05/06/2021   Amenorrhea, secondary 03/02/2021   PICC (peripherally inserted central catheter) in place 09/09/2020   Genetic testing 08/11/2020   Iron deficiency anemia due to chronic blood loss 08/04/2020   Family history of colon cancer    Malignant neoplasm of upper-outer quadrant of right breast in female, estrogen receptor positive ([place]) 07/28/2020    Otelia Limes, PTA 06/13/2021, 12:41 PM  [place], [place], [address] Phone: [phone]   Fax:  [phone]  Name: Lorijean Husser MRN: [id number] Date of Birth: April 09, 1976

## 2021-06-14 ENCOUNTER — Ambulatory Visit
Admission: RE | Admit: 2021-06-14 | Discharge: 2021-06-14 | Disposition: A | Discharge status: Home / Self Care | Payer: Medicaid Other | Source: Ambulatory Visit | Attending: Radiation Oncology | Admitting: Radiation Oncology

## 2021-06-14 ENCOUNTER — Ambulatory Visit: Payer: Medicaid Other | Admitting: Radiation Oncology

## 2021-06-14 DIAGNOSIS — I890 Lymphedema, not elsewhere classified: Secondary | ICD-10-CM | POA: Diagnosis not present

## 2021-06-15 ENCOUNTER — Ambulatory Visit: Payer: Medicaid Other

## 2021-06-15 ENCOUNTER — Other Ambulatory Visit: Payer: Self-pay

## 2021-06-15 DIAGNOSIS — R293 Abnormal posture: Secondary | ICD-10-CM

## 2021-06-15 DIAGNOSIS — L599 Disorder of the skin and subcutaneous tissue related to radiation, unspecified: Secondary | ICD-10-CM

## 2021-06-15 DIAGNOSIS — I890 Lymphedema, not elsewhere classified: Secondary | ICD-10-CM | POA: Diagnosis not present

## 2021-06-15 DIAGNOSIS — Z483 Aftercare following surgery for neoplasm: Secondary | ICD-10-CM

## 2021-06-15 DIAGNOSIS — M25511 Pain in right shoulder: Secondary | ICD-10-CM

## 2021-06-15 DIAGNOSIS — Z170 Estrogen receptor positive status [ER+]: Secondary | ICD-10-CM

## 2021-06-15 DIAGNOSIS — M25611 Stiffness of right shoulder, not elsewhere classified: Secondary | ICD-10-CM

## 2021-06-15 DIAGNOSIS — C50411 Malignant neoplasm of upper-outer quadrant of right female breast: Secondary | ICD-10-CM

## 2021-06-15 NOTE — Addendum Note (Signed)
Addended by: Claris Pong on: 06/15/2021 03:12 PM   Modules accepted: Orders

## 2021-06-15 NOTE — Therapy (Addendum)
[place], [place], [address] Phone: [phone]   Fax:  [phone]  Physical Therapy Treatment  Patient Details  Name: Melody Rodriguez MRN: [id number] Date of Birth: [birth year] Referring Provider (PT): Dr. [name]   Encounter Date: 06/15/2021   PT End of Session - 06/15/21 1126     Visit Number 12    Number of Visits 20    Date for PT Re-Evaluation 08/10/21   MD note 06/28/21   Authorization Type [employer] - needs auth after 20 visits    Authorization - Visit Number 12    Authorization - Number of Visits 20    PT Start Time 1009    PT Stop Time 1106    PT Time Calculation (min) 57 min    Activity Tolerance Patient limited by pain    Behavior During Therapy [facility] for tasks assessed/performed             Past Medical History:  Diagnosis Date   Breast cancer ([place])    Family history of colon cancer     Past Surgical History:  Procedure Laterality Date   BREAST BIOPSY Right 02/18/2021   Procedure: EVACUATION HEMATOMA RIGHT AXILLA;  Surgeon: [name], MD;  Location: [place];  Service: General;  Laterality: Right;   BREAST CYST EXCISION Right    Patient does not recall (2014 or 2015)   BREAST LUMPECTOMY WITH RADIOACTIVE SEED AND SENTINEL LYMPH NODE BIOPSY Right 02/07/2021   Procedure: RIGHT BREAST LUMPECTOMY WITH RADIOACTIVE SEED AND SEED TARGETED LYMPH NODE BIOPSY AND SENTINEL LYMPH NODE BIOPSY;  Surgeon: [name], MD;  Location: [facility];  Service: General;  Laterality: Right;   CESAREAN SECTION     IR IMAGING GUIDED PORT INSERTION  09/15/2020   IR REMOVAL TUN CV CATH W/O FL  09/15/2020   NODE DISSECTION Right 03/28/2021   Procedure: RIGHT AXILLARY LYMPH NODE DISSECTION;  Surgeon: [name], MD;  Location: [facility];  Service: General;  Laterality: Right;   PORT-A-CATH REMOVAL Left 02/07/2021    Procedure: REMOVAL PORT-A-CATH;  Surgeon: [name], MD;  Location: [place];  Service: General;  Laterality: Left;   THYROIDECTOMY, PARTIAL      There were no vitals filed for this visit.                      [facility] Adult PT Treatment/Exercise - 06/15/21 0001       Self-Care   Other Self-Care Comments  Discussed pts current functional status and reassessed goals. Pt will be placed on hold until finishing radiation at this time due to increased skin sensitivity. Encouraged her to cont end ROM stretching as much able throughout day to try to prevent any further loss of A/ROM while completing radiation.      Manual Therapy   Myofascial Release Gently to pts tolerance to Rt axilla, cording palpable here    Manual Lymphatic Drainage (MLD) In Supine: Short neck, superficial and deep abdominals, Lt axillary nodes, Lt intact upper quadrant, and anterior inter-axillary anastomosis, Rt inguinal nodes and Rt axillo-inguinal anastomosis, then focused on Rt breast and UE redirecting towards anastomosis    Passive ROM In Supine to Rt shoulder into flexion, abduction and D2 to pts tolerance; pt with near full P/ROM by end of session today and cording very mildly palpated only at end motions now in medial upper arm  PT Long Term Goals - 06/15/21 1013       PT LONG TERM GOAL #1   Title Patient will demonstrate she has regained full shoulder ROM and function post operatively compared to baselines.    Baseline See eval for objectives; flexion has returned to baseline - 06/15/21    Status Partially Met      PT LONG TERM GOAL #2   Title Patient will increase right shoulder active flexion to >/= 120 degrees for inceased ease reaching overhead.    Baseline 78 post op; 05/31/21- 139; 142 degrees - 06/15/21    Status Achieved      PT LONG TERM GOAL #3   Title Patient will increase her right shoulder active abduction to >/= 130  degrees to obtain radiation positioning.    Baseline 80 degrees; 05/31/21- 137; 119 degrees (pt reports this feeling tighter now since starting radiation) - 06/15/21    Status On-going      PT LONG TERM GOAL #4   Title Patient will improve her DASH score to </= 53 for improved arm function.    Baseline 78; 05/31/21- 50    Status Achieved      PT LONG TERM GOAL #5   Title Patient will report she has a good understanding of lymphedema risk reduction practices.    Baseline No knowledge; 05/31/21- educated pt on this today and issued handout    Status Achieved      PT LONG TERM GOAL #6   Title Pt will be independent in a home exercise program for continued strengthening and stretching.    Status On-going      PT LONG TERM GOAL #7   Title Pt will report a 50% improvement in pain to allow her to clean the bathroom and do ADLs with improved comfort.    Baseline 7/10; this has not improved yet - 06/15/21    Status On-going      PT LONG TERM GOAL #8   Title Pt will be independent in self MLD for long term management of edema.    Baseline Started instructing pt in this - 06/15/21    Status On-going      PT LONG TERM GOAL  #9   TITLE Pt will be able to reach up in to the cabinet with her right arm to put up dishes to allow her to return to prior level of function.    Baseline uses L UE; pt able to use Rt arm about 50% of time with this now - 06/15/21    Status Partially Met                   Plan - 06/15/21 1137     Clinical Impression Statement Spent beginning of session assessing goals and current functional status. Pt reports overall she feels her daily pain is 60-70% better but only about 50% better with reaching to high shelves in kitchen. She does not feel like she has improved with cleaing her bathroom. She had met her A/ROM goals but her abduction has decreased since last measured and this is due to pt currently undergoing radiation and she repots new sensivity and shoulder pain  due to this. She is also moving on Friday so she has been packing alot around her house and lifting boxes so spent some time educating her about proper lifting and being mindful to not overcompensate with her Rt scapula. Pt was able to verbalize good understanding  of this. Medicaid auth done  this session but plan to hold physical therapy until after she completes radiation as she is beginning to experience increased pain and skin sensitivity and will resume during first week of Novemeber. Pt is agreeable to this.    Personal Factors and Comorbidities Comorbidity 3+    Comorbidities muliple surgeries, chemotherapy    Examination-Activity Limitations Reach Overhead;Lift;Carry    Examination-Participation Restrictions Occupation    Stability/Clinical Decision Making Stable/Uncomplicated    Rehab Potential Good    PT Frequency 2x / week    PT Duration 4 weeks    PT Treatment/Interventions Therapeutic exercise;Patient/family education;Manual techniques;Manual lymph drainage;Scar mobilization;Passive range of motion;ADLs/Self Care Home Management;Orthotic Fit/Training;Vasopneumatic Device;Taping    PT Next Visit Plan Reassess at next session (MD renewal needed) as pt would have just recently completed radiation. If still having posterior shoulder pain try ionto??? Try to encourage pt to wear a sport bra (compression bra?) Cont PROM of Rt shoulder and manual therapy to Rt upper quadrant; also cont MLD to Rt breast and instruct pt in this, issuing handout; AAROM exercises with pulleys and ball roll up wall, measure for compression sleeve?/ cont every 3 month L-Dex screens for up to 2 years from her SLNB    PT Home Exercise Plan Post op shoulder ROM HEP, supine dowel exercises    Consulted and Agree with Plan of Care Patient             Patient will benefit from skilled therapeutic intervention in order to improve the following deficits and impairments:  Postural dysfunction, Decreased range of motion,  Pain, Impaired UE functional use, Decreased knowledge of precautions, Increased fascial restricitons, Decreased scar mobility, Increased edema, Decreased strength  Visit Diagnosis: Lymphedema, not elsewhere classified  Disorder of the skin and subcutaneous tissue related to radiation, unspecified  Aftercare following surgery for neoplasm  Stiffness of right shoulder, not elsewhere classified  Acute pain of right shoulder  Abnormal posture  Malignant neoplasm of upper-outer quadrant of right breast in female, estrogen receptor positive ([place])     Problem List Patient Active Problem List   Diagnosis Date Noted   Bartholin cyst 05/06/2021   Amenorrhea, secondary 03/02/2021   PICC (peripherally inserted central catheter) in place 09/09/2020   Genetic testing 08/11/2020   Iron deficiency anemia due to chronic blood loss 08/04/2020   Family history of colon cancer    Malignant neoplasm of upper-outer quadrant of right breast in female, estrogen receptor positive ([place]) 07/28/2020    Otelia Limes, PTA 06/15/2021, 11:47 AM  [place], [place], [address] Phone: [phone]   Fax:  [phone]  Name: Melody Rodriguez MRN: [id number] Date of Birth: [birth year]

## 2021-06-16 ENCOUNTER — Ambulatory Visit
Admission: RE | Admit: 2021-06-16 | Discharge: 2021-06-16 | Disposition: A | Discharge status: Home / Self Care | Payer: Medicaid Other | Source: Ambulatory Visit | Attending: Radiation Oncology | Admitting: Radiation Oncology

## 2021-06-16 DIAGNOSIS — I890 Lymphedema, not elsewhere classified: Secondary | ICD-10-CM | POA: Diagnosis not present

## 2021-06-17 ENCOUNTER — Ambulatory Visit: Payer: Medicaid Other

## 2021-06-19 IMAGING — CR DG CHEST 2V
2 series · 2 of 2 positions shown · non-contrast
Comparison: None.

CLINICAL DATA: Chest pain. Chills and body aches. [id number]
exposure.

EXAM:
CHEST - 2 VIEW

[w chest lat]
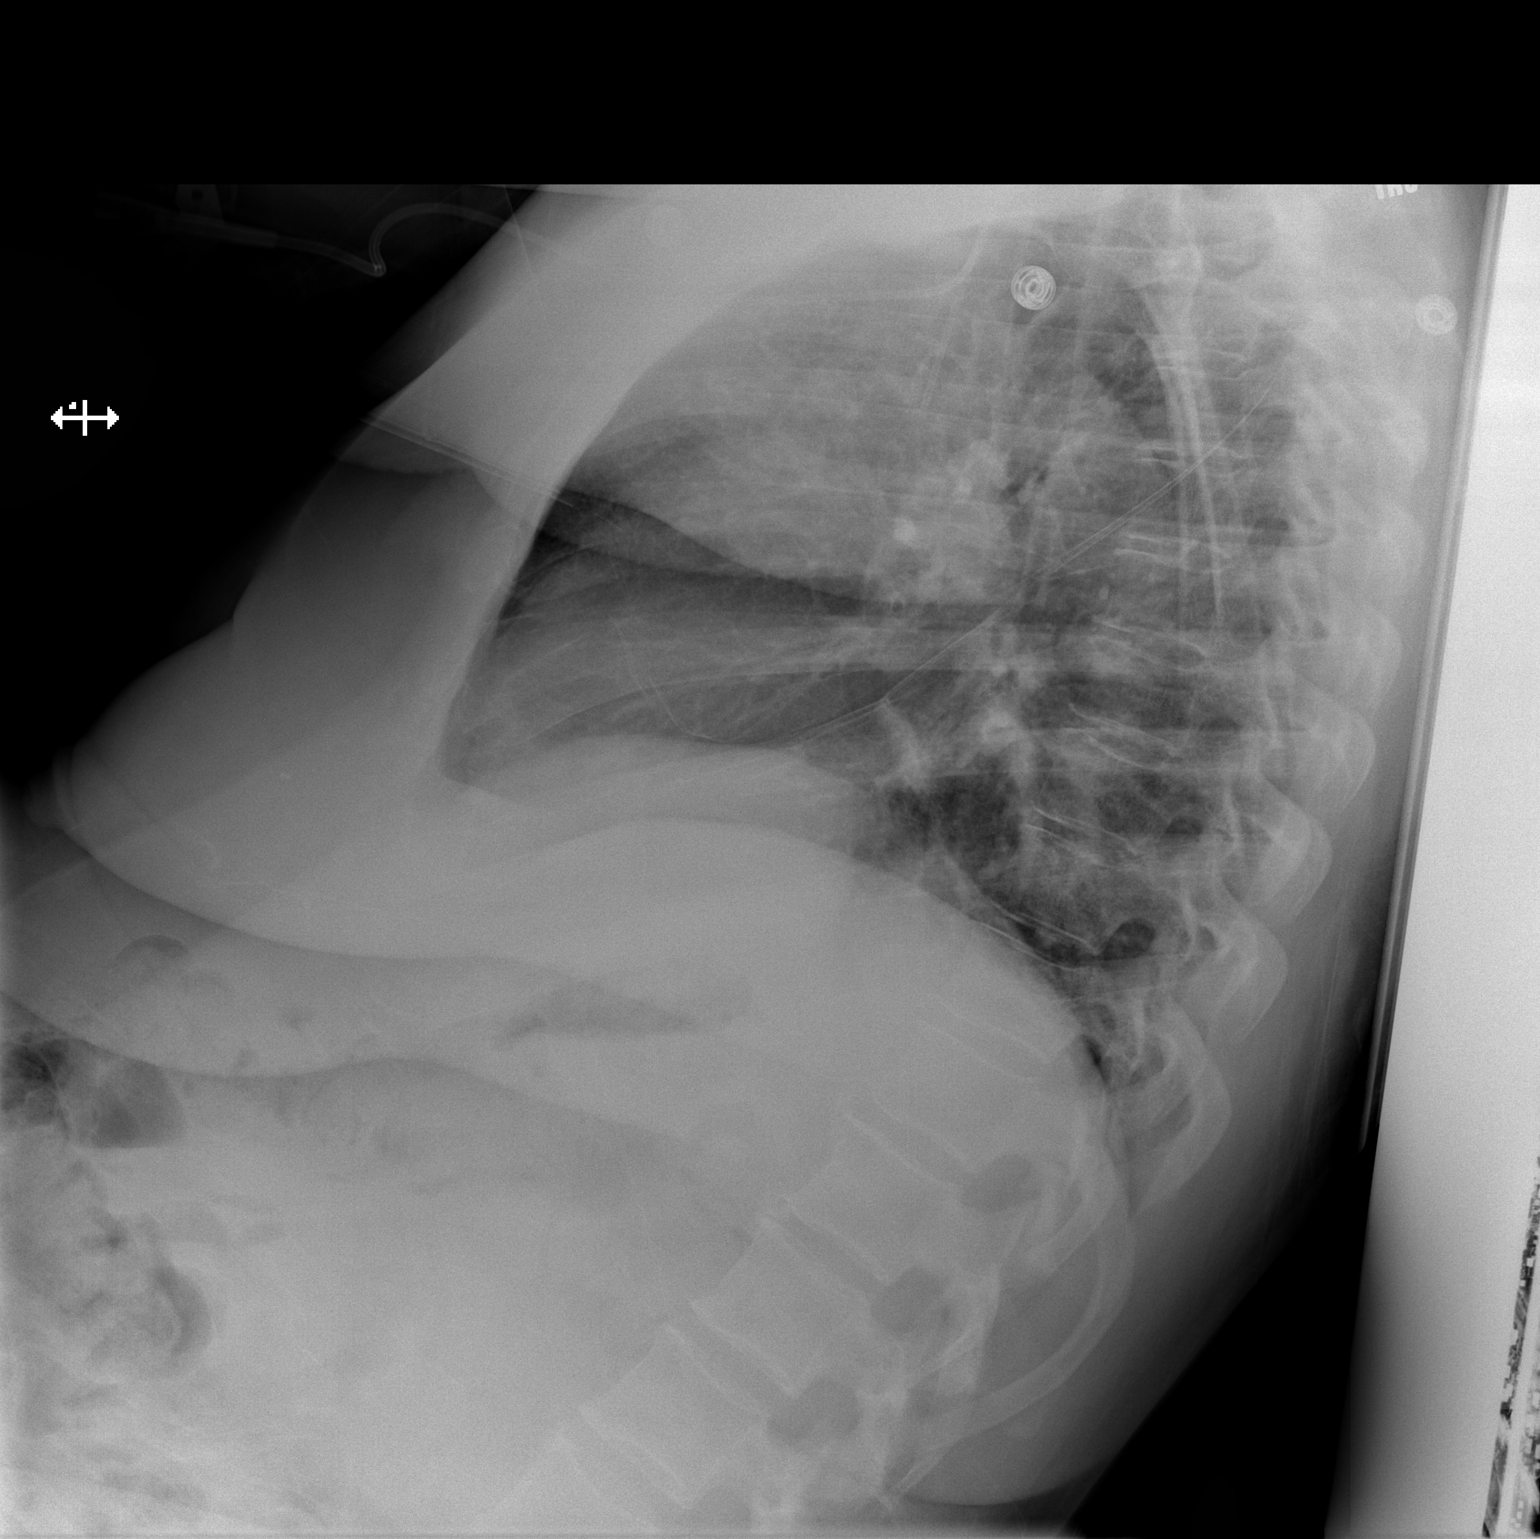

[x chest ap]
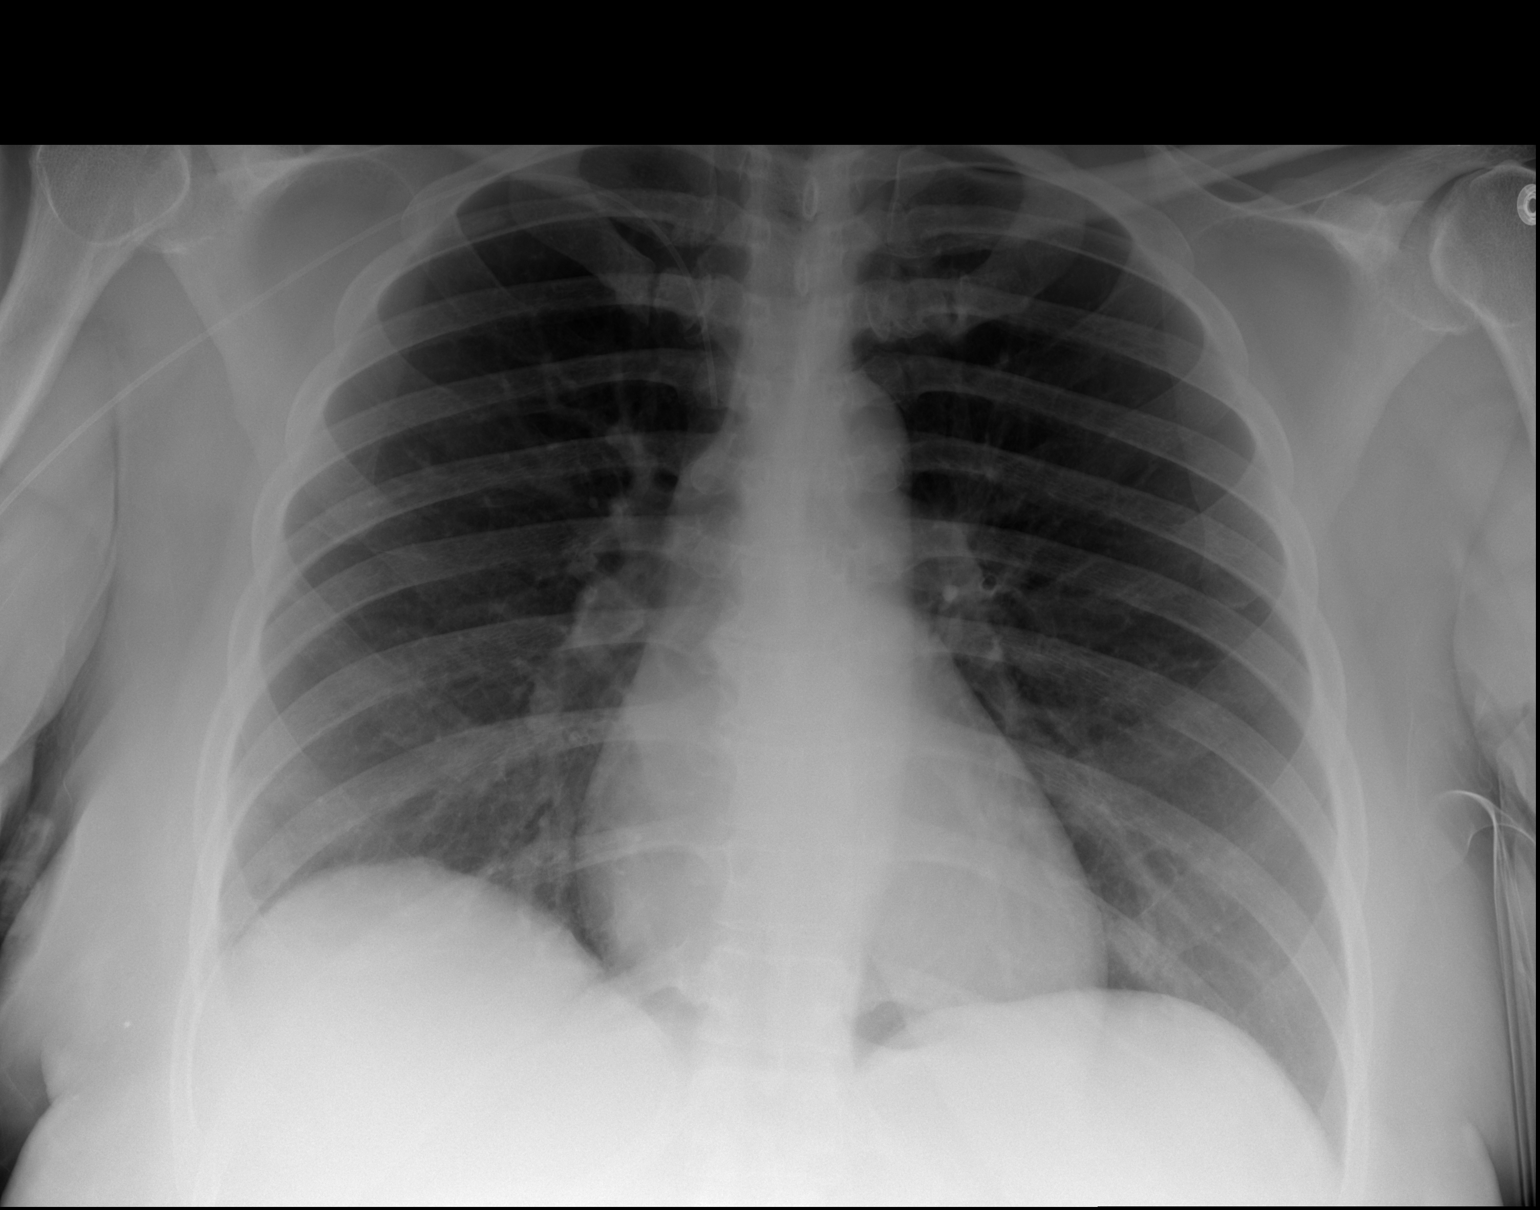

[2 of 2 positions shown; findings below may reference images not displayed]

FINDINGS: A right PICC terminates over the upper SVC. The cardiomediastinal
silhouette is within normal limits. No airspace consolidation,
edema, pleural effusion, or pneumothorax is identified. No acute
osseous abnormality is seen.
IMPRESSION: No active cardiopulmonary disease.

## 2021-06-20 ENCOUNTER — Ambulatory Visit: Payer: Medicaid Other

## 2021-06-20 ENCOUNTER — Ambulatory Visit
Admission: RE | Admit: 2021-06-20 | Discharge: 2021-06-20 | Disposition: A | Discharge status: Home / Self Care | Payer: Medicaid Other | Source: Ambulatory Visit | Attending: Radiation Oncology | Admitting: Radiation Oncology

## 2021-06-20 DIAGNOSIS — Z170 Estrogen receptor positive status [ER+]: Secondary | ICD-10-CM | POA: Insufficient documentation

## 2021-06-20 DIAGNOSIS — Z510 Encounter for antineoplastic radiation therapy: Secondary | ICD-10-CM | POA: Insufficient documentation

## 2021-06-20 DIAGNOSIS — C50411 Malignant neoplasm of upper-outer quadrant of right female breast: Secondary | ICD-10-CM | POA: Diagnosis present

## 2021-06-20 DIAGNOSIS — Z923 Personal history of irradiation: Secondary | ICD-10-CM | POA: Diagnosis not present

## 2021-06-20 DIAGNOSIS — Z9221 Personal history of antineoplastic chemotherapy: Secondary | ICD-10-CM | POA: Diagnosis not present

## 2021-06-20 DIAGNOSIS — Z79811 Long term (current) use of aromatase inhibitors: Secondary | ICD-10-CM | POA: Diagnosis not present

## 2021-06-20 DIAGNOSIS — Z79899 Other long term (current) drug therapy: Secondary | ICD-10-CM | POA: Diagnosis not present

## 2021-06-20 DIAGNOSIS — C773 Secondary and unspecified malignant neoplasm of axilla and upper limb lymph nodes: Secondary | ICD-10-CM | POA: Diagnosis present

## 2021-06-21 ENCOUNTER — Ambulatory Visit: Payer: Medicaid Other

## 2021-06-21 ENCOUNTER — Other Ambulatory Visit (HOSPITAL_COMMUNITY): Payer: Self-pay

## 2021-06-21 ENCOUNTER — Other Ambulatory Visit: Payer: Self-pay | Admitting: Radiation Oncology

## 2021-06-21 ENCOUNTER — Ambulatory Visit
Admission: RE | Admit: 2021-06-21 | Discharge: 2021-06-21 | Disposition: A | Discharge status: Home / Self Care | Payer: Medicaid Other | Source: Ambulatory Visit | Attending: Radiation Oncology | Admitting: Radiation Oncology

## 2021-06-21 ENCOUNTER — Other Ambulatory Visit: Payer: Self-pay

## 2021-06-21 DIAGNOSIS — Z510 Encounter for antineoplastic radiation therapy: Secondary | ICD-10-CM | POA: Diagnosis not present

## 2021-06-21 MED ORDER — OXYCODONE HCL 5 MG PO TABS
5.0000 mg | ORAL_TABLET | Freq: Four times a day (QID) | ORAL | 0 refills | Status: DC | PRN
  Filled 2021-06-21: qty 30, 4d supply, fill #0

## 2021-06-21 MED ORDER — OXYCODONE HCL 5 MG PO TABS: 5.0000 mg | ORAL_TABLET | Freq: Four times a day (QID) | ORAL | 0 refills | Status: DC | PRN

## 2021-06-22 ENCOUNTER — Ambulatory Visit: Payer: Medicaid Other

## 2021-06-23 ENCOUNTER — Ambulatory Visit: Payer: Medicaid Other

## 2021-06-23 ENCOUNTER — Other Ambulatory Visit: Payer: Self-pay

## 2021-06-23 DIAGNOSIS — Z510 Encounter for antineoplastic radiation therapy: Secondary | ICD-10-CM | POA: Diagnosis not present

## 2021-06-24 ENCOUNTER — Ambulatory Visit: Payer: Medicaid Other

## 2021-06-24 ENCOUNTER — Encounter: Payer: Medicaid Other | Admitting: Rehabilitation

## 2021-06-24 DIAGNOSIS — Z510 Encounter for antineoplastic radiation therapy: Secondary | ICD-10-CM | POA: Diagnosis not present

## 2021-06-25 NOTE — Progress Notes (Signed)
Patient Care Team: Patient, [name] as PCP - General (General Practice) Rockwell Germany, RN as Oncology Nurse Navigator Mauro Kaufmann, RN as Oncology Nurse Navigator [name], MD as Consulting Physician (General Surgery) Nicholas Lose, MD as Consulting Physician (Hematology and Oncology) Gery Pray, MD as Consulting Physician (Radiation Oncology)  DIAGNOSIS:    ICD-10-CM   1. Malignant neoplasm of upper-outer quadrant of right breast in female, estrogen receptor positive (Melody Rodriguez)  C50.411    Z17.0       SUMMARY OF ONCOLOGIC HISTORY: Oncology History  Malignant neoplasm of upper-outer quadrant of right breast in female, estrogen receptor positive (Melody Rodriguez)  07/28/2020 Initial Diagnosis   Patient palpated a right breast mass for 1-2 years. Mammogram showed a 2.2cm mass at the 11 o'clock position with surrounding calcifications, 6.4cm in total extent, and up to 5 abnormal right axillary lymph nodes. 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    08/05/2020 Miscellaneous   MammaPrint: High risk luminal type B   08/11/2020 Genetic Testing   Negative genetic testing: no pathogenic variants detected in [facility] Breast Cancer STAT Panel or Common Hereditary Cancers panel. The report dates are August 11, 2020 and August 19, 2020, respectively. Two variants of uncertain signficance were detected - one in the CTNNA1 gene called c.86del and the second in the MLH1 gene called c.808A>G.   UPDATE:  The MLH1 c.808A>G VUS was reclassified to "Likely Benign" on 02/19/2021. The change in variant classification was made as a result of re-review of the evidence in light of new variant interpretation guidelines and/or new information.   The STAT Breast cancer panel offered by [facility] includes sequencing and rearrangement analysis for the following 9  genes:  ATM, BRCA1, BRCA2, CDH1, CHEK2, PALB2, PTEN, STK11 and TP53.  The Common Hereditary Cancers Panel offered by [facility] includes sequencing and/or deletion duplication testing of the following 48 genes: APC, ATM, AXIN2, BARD1, BMPR1A, BRCA1, BRCA2, BRIP1, CDH1, CDK4, CDKN2A (p14ARF), CDKN2A (p16INK4a), CHEK2, CTNNA1, DICER1, EPCAM (Deletion/duplication testing only), GREM1 (promoter region deletion/duplication testing only), KIT, MEN1, MLH1, MSH2, MSH3, MSH6, MUTYH, NBN, NF1, NTHL1, PALB2, PDGFRA, PMS2, POLD1, POLE, PTEN, RAD50, RAD51C, RAD51D, RNF43, SDHB, SDHC, SDHD, SMAD4, SMARCA4. STK11, TP53, TSC1, TSC2, and VHL.  The following genes were evaluated for sequence changes only: SDHA and HOXB13 c.251G>A variant only.    09/01/2020 - 01/11/2021 Chemotherapy   Patient is on Treatment Plan : BREAST Dose Dense AC q14d / PACLitaxel D1,8,15 q21d (Carbo removed by Dr. Lindi Adie)     02/07/2021 Surgery   Right lumpectomy Melody Rodriguez): invasive and in situ ductal carcinoma, 2.8cm, clear margins, with metastatic carcinoma in 1/1 right axillary lymph nodes.     CHIEF COMPLIANT: Follow-up of right breast cancer  INTERVAL HISTORY: Melody Rodriguez is a 45 y.o. with above-mentioned history of right breast who completed neoadjuvant chemotherapy and lumpectomy. She presents to the clinic today for follow-up.  She is complaining of severe radiation dermatitis especially in the bottom of the neck.  She has also been complaining of severe pain in the right shoulder for which she takes pain medications on a on and off basis.  She tells me that Tylenol and Motrin are not helping her and only narcotic pain medications are helping her symptoms.  ALLERGIES:  is allergic to cephalexin, latex, and naproxen.  MEDICATIONS:  Current Outpatient Medications  Medication Sig Dispense Refill   [START ON 07/19/2021] letrozole (FEMARA) 2.5 MG tablet Take 1 tablet (2.5 mg total) by mouth daily. 90 tablet 3   acetaminophen (TYLENOL) 325 MG  tablet Take by mouth.     cyclobenzaprine (FLEXERIL) 5 MG tablet TAKE 1 TABLET BY MOUTH 3 TIMES DAILY AS NEEDED FOR MUSCLE SPASMS. 20 tablet 0   fexofenadine (ALLEGRA) 180 MG tablet Take by mouth.     fluticasone (FLONASE) 50 MCG/ACT nasal spray 2 sprays by Each Nare route daily for 30 days.     loratadine (CLARITIN) 10 MG tablet Take 10 mg by mouth daily.     oxyCODONE (OXY IR/ROXICODONE) 5 MG immediate release tablet Take 1-2 tablets (5-10 mg total) by mouth every 6 (six) hours as needed for moderate pain, severe pain or breakthrough pain. 30 tablet 0   venlafaxine XR (EFFEXOR-XR) 37.5 MG 24 hr capsule TAKE 1 CAPSULE BY MOUTH DAILY WITH BREAKFAST 30 capsule 6   No current facility-administered medications for this visit.    PHYSICAL EXAMINATION: ECOG PERFORMANCE STATUS: 1 - Symptomatic but completely ambulatory  Vitals:   06/27/21 0939  BP: 131/71  Pulse: 85  Resp: 18  Temp: 97.7 F (36.5 C)  SpO2: 99%   Filed Weights   06/27/21 0939  Weight: 214 lb 8 oz (97.3 kg)      LABORATORY DATA:  I have reviewed the data as listed CMP Latest Ref Rng & Units 02/03/2021 01/11/2021 01/04/2021  Glucose 70 - 99 mg/dL 197(H) 211(H) 180(H)  BUN 6 - 20 mg/dL _0 Creatinine 0.44 - 1.00 mg/dL 0.56 0.69 0.68  Sodium 135 - 145 mmol/L 135 140 142  Potassium 3.5 - 5.1 mmol/L 4.2 4.0 3.8  Chloride 98 - 111 mmol/L 101 108 108  CO2 22 - 32 mmol/L 22 21(L) 24  Calcium 8.9 - 10.3 mg/dL 9.7 9.0 8.7(L)  Total Protein 6.5 - 8.1 g/dL - 7.3 6.6  Total Bilirubin 0.3 - 1.2 mg/dL - 0.2(L) <0.2(L)  Alkaline Phos 38 - 126 U/L - 56 52  AST 15 - 41 U/L - 22 15  ALT 0 - 44 U/L - 29 18    Lab Results  Component Value Date   WBC 5.4 01/11/2021   HGB 12.6 01/11/2021   HCT 36.8 01/11/2021   MCV 96.1 01/11/2021   PLT 239 01/11/2021   NEUTROABS 3.5 01/11/2021    ASSESSMENT & PLAN:  Malignant neoplasm of upper-outer quadrant of right breast in female, estrogen receptor positive  ([place]) 07/28/2020:Patient palpated a right breast mass for 1-2 years. Mammogram showed a 2.2cm mass at the 11 o'clock position with surrounding calcifications, 6.4cm in total extent, and up to 5 abnormal right axillary lymph nodes. 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   Treatment plan: 1. Neoadjuvant chemotherapy (MammaPrint test High Risk): AC foll by Taxol completed 01/11/21 2. 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 Adjuvant radiation therapy 05/11/2021-07/04/2021 4. Follow-up adjuvant antiestrogen therapy along with abemaciclib (patient has total of 4 lymph nodes positive), to start 07/19/2021 [facility] 16070: Treatment of refractory nausea 5. ALND 03/28/21: 3/7 LN positive -------------------------------------------------------------------------------------------------------------------------------  Treatment plan:  Zoladex injections monthly starting next week with letrozole  2. If she is tolerates this well then we will start abemaciclib in 3 months.  Letrozole counseling: We discussed the risks and benefits of anti-estrogen therapy with aromatase inhibitors. These include but not limited to insomnia, hot flashes, mood changes, vaginal dryness, bone density loss, and weight gain. We strongly believe that the benefits far outweigh the risks. Patient understands these risks and consented to starting treatment. Planned treatment duration is 7 years.  Abemaciclib counseling: I discussed at length the risks and benefits of Abemaciclib in combination with letrozole. Adverse effects of Abemaciclib include decreasing neutrophil count, pneumonia, blood clots in lungs as well as nausea and  GI symptoms. Side effects of letrozole include hot flashes, muscle aches and pains, uterine bleeding/spotting/cancer, osteoporosis, risk of blood clots.  Return to clinic in 3 months for survivorship care plan visit.  At that time she will be started on abemaciclib.  2 weeks after starting abemaciclib, she will meet with our advanced pharmacy practitioner Cindra Eves.    No orders of the defined types were placed in this encounter.  The patient has a good understanding of the overall plan. she agrees with it. she will call with any problems that may develop before the next visit here.  Total time spent: 20 mins including face to face time and time spent for planning, charting and coordination of care  Rulon Eisenmenger, MD, MPH 06/27/2021  I, Thana Ates, am acting as scribe for Dr. Nicholas Lose.  I have reviewed the above documentation for accuracy and completeness, and I agree with the above.

## 2021-06-27 ENCOUNTER — Inpatient Hospital Stay: Payer: Medicaid Other | Attending: Hematology and Oncology | Admitting: Hematology and Oncology

## 2021-06-27 ENCOUNTER — Ambulatory Visit: Payer: Medicaid Other

## 2021-06-27 ENCOUNTER — Ambulatory Visit
Admission: RE | Admit: 2021-06-27 | Discharge: 2021-06-27 | Disposition: A | Discharge status: Home / Self Care | Payer: Medicaid Other | Source: Ambulatory Visit | Attending: Radiation Oncology | Admitting: Radiation Oncology

## 2021-06-27 ENCOUNTER — Other Ambulatory Visit: Payer: Self-pay

## 2021-06-27 ENCOUNTER — Other Ambulatory Visit (HOSPITAL_COMMUNITY): Payer: Self-pay

## 2021-06-27 DIAGNOSIS — Z170 Estrogen receptor positive status [ER+]: Secondary | ICD-10-CM | POA: Diagnosis not present

## 2021-06-27 DIAGNOSIS — Z79899 Other long term (current) drug therapy: Secondary | ICD-10-CM | POA: Insufficient documentation

## 2021-06-27 DIAGNOSIS — Z510 Encounter for antineoplastic radiation therapy: Secondary | ICD-10-CM | POA: Insufficient documentation

## 2021-06-27 DIAGNOSIS — C773 Secondary and unspecified malignant neoplasm of axilla and upper limb lymph nodes: Secondary | ICD-10-CM | POA: Insufficient documentation

## 2021-06-27 DIAGNOSIS — Z923 Personal history of irradiation: Secondary | ICD-10-CM | POA: Insufficient documentation

## 2021-06-27 DIAGNOSIS — C50411 Malignant neoplasm of upper-outer quadrant of right female breast: Secondary | ICD-10-CM | POA: Insufficient documentation

## 2021-06-27 DIAGNOSIS — Z79811 Long term (current) use of aromatase inhibitors: Secondary | ICD-10-CM | POA: Insufficient documentation

## 2021-06-27 DIAGNOSIS — Z9221 Personal history of antineoplastic chemotherapy: Secondary | ICD-10-CM | POA: Insufficient documentation

## 2021-06-27 MED ORDER — LETROZOLE 2.5 MG PO TABS
2.5000 mg | ORAL_TABLET | Freq: Every day | ORAL | 3 refills | Status: DC
  Filled 2021-06-27: qty 90, 90d supply, fill #0
  Filled 2021-08-31 – 2021-10-09 (×2): qty 90, 90d supply, fill #1

## 2021-06-27 NOTE — Assessment & Plan Note (Signed)
07/28/2020:Patient palpated a right breast mass for 1-2 years. Mammogram showed a 2.2cm mass at the 11 o'clock position with surrounding calcifications, 6.4cm in total extent, and up to 5 abnormal right axillary lymph nodes. 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  Treatment plan: 1. Neoadjuvant chemotherapy (MammaPrint test High Risk): AC foll by Taxol completed 01/11/21 2.Right lumpectomy: 02/07/2021: Grade 2 IDC 2.8 cm with DCIS, margins negative, lymphovascular space invasion present, 1/1 lymph node positive with extracapsular extension, ER 50% weak, PR 20% strong, HER2 negative, Ki-67 20% 3. Adjuvant radiation therapy 05/11/2021-07/04/2021 4. Follow-up adjuvant antiestrogen therapy along with abemaciclib (patient has total of 4 lymph nodes positive), to start 07/19/2021 [facility] 16070: Treatment of refractory nausea 5. ALND 03/28/21: 3/7 LN positive ------------------------------------------------------------------------------------------------------------------------------- Boils in the perineum: There is a Bartholin cyst   Anastrozole counseling: We discussed the risks and benefits of anti-estrogen therapy with aromatase inhibitors. These include but not limited to insomnia, hot flashes, mood changes, vaginal dryness, bone density loss, and weight gain. We strongly believe that the benefits far outweigh the risks. Patient understands these risks and consented to starting treatment. Planned treatment duration is 7 years.  Abemaciclib counseling: I discussed at length the risks and benefits of Abemaciclib in combination with letrozole. Adverse effects of Abemaciclib include decreasing neutrophil count, pneumonia, blood clots in lungs as well as nausea and GI symptoms. Side effects of letrozole include hot flashes, muscle aches and pains, uterine  bleeding/spotting/cancer, osteoporosis, risk of blood clots.  Return to clinic 2 weeks after starting abemaciclib to meet with her pharmacy provider Cindra Eves.

## 2021-06-28 ENCOUNTER — Ambulatory Visit: Payer: Medicaid Other

## 2021-06-28 ENCOUNTER — Encounter: Payer: Self-pay | Admitting: *Deleted

## 2021-06-28 ENCOUNTER — Ambulatory Visit
Admission: RE | Admit: 2021-06-28 | Discharge: 2021-06-28 | Disposition: A | Discharge status: Home / Self Care | Payer: Medicaid Other | Source: Ambulatory Visit | Attending: Radiation Oncology | Admitting: Radiation Oncology

## 2021-06-28 DIAGNOSIS — C50411 Malignant neoplasm of upper-outer quadrant of right female breast: Secondary | ICD-10-CM

## 2021-06-28 DIAGNOSIS — Z510 Encounter for antineoplastic radiation therapy: Secondary | ICD-10-CM | POA: Diagnosis not present

## 2021-06-28 DIAGNOSIS — Z170 Estrogen receptor positive status [ER+]: Secondary | ICD-10-CM

## 2021-06-29 ENCOUNTER — Ambulatory Visit
Admission: RE | Admit: 2021-06-29 | Discharge: 2021-06-29 | Disposition: A | Discharge status: Home / Self Care | Payer: Medicaid Other | Source: Ambulatory Visit | Attending: Radiation Oncology | Admitting: Radiation Oncology

## 2021-06-29 ENCOUNTER — Ambulatory Visit: Payer: Medicaid Other

## 2021-06-29 ENCOUNTER — Other Ambulatory Visit: Payer: Self-pay

## 2021-06-29 DIAGNOSIS — Z510 Encounter for antineoplastic radiation therapy: Secondary | ICD-10-CM | POA: Diagnosis not present

## 2021-06-30 ENCOUNTER — Ambulatory Visit: Payer: Medicaid Other

## 2021-06-30 ENCOUNTER — Ambulatory Visit
Admission: RE | Admit: 2021-06-30 | Discharge: 2021-06-30 | Disposition: A | Discharge status: Home / Self Care | Payer: Medicaid Other | Source: Ambulatory Visit | Attending: Radiation Oncology | Admitting: Radiation Oncology

## 2021-06-30 DIAGNOSIS — Z510 Encounter for antineoplastic radiation therapy: Secondary | ICD-10-CM | POA: Diagnosis not present

## 2021-07-01 ENCOUNTER — Ambulatory Visit: Payer: Medicaid Other

## 2021-07-01 ENCOUNTER — Other Ambulatory Visit: Payer: Self-pay

## 2021-07-01 ENCOUNTER — Ambulatory Visit
Admission: RE | Admit: 2021-07-01 | Discharge: 2021-07-01 | Disposition: A | Discharge status: Home / Self Care | Payer: Medicaid Other | Source: Ambulatory Visit | Attending: Radiation Oncology | Admitting: Radiation Oncology

## 2021-07-01 ENCOUNTER — Encounter: Payer: Self-pay | Admitting: Radiation Oncology

## 2021-07-01 DIAGNOSIS — Z510 Encounter for antineoplastic radiation therapy: Secondary | ICD-10-CM | POA: Diagnosis not present

## 2021-07-04 ENCOUNTER — Inpatient Hospital Stay: Payer: Medicaid Other

## 2021-07-04 ENCOUNTER — Other Ambulatory Visit: Payer: Self-pay

## 2021-07-04 VITALS — BP 143/91 | HR 84 | Temp 98.5°F | Resp 16

## 2021-07-04 DIAGNOSIS — Z510 Encounter for antineoplastic radiation therapy: Secondary | ICD-10-CM | POA: Diagnosis not present

## 2021-07-04 DIAGNOSIS — C50411 Malignant neoplasm of upper-outer quadrant of right female breast: Secondary | ICD-10-CM

## 2021-07-04 DIAGNOSIS — Z170 Estrogen receptor positive status [ER+]: Secondary | ICD-10-CM

## 2021-07-04 MED ORDER — GOSERELIN ACETATE 3.6 MG ~~LOC~~ IMPL
3.6000 mg | DRUG_IMPLANT | Freq: Once | SUBCUTANEOUS | Status: AC
  Administered 2021-07-04: 3.6 mg via SUBCUTANEOUS
  Filled 2021-07-04: qty 3.6

## 2021-07-18 ENCOUNTER — Ambulatory Visit: Payer: Medicaid Other | Attending: Surgery

## 2021-07-18 ENCOUNTER — Other Ambulatory Visit: Payer: Self-pay

## 2021-07-18 DIAGNOSIS — Z483 Aftercare following surgery for neoplasm: Secondary | ICD-10-CM | POA: Insufficient documentation

## 2021-07-18 DIAGNOSIS — L599 Disorder of the skin and subcutaneous tissue related to radiation, unspecified: Secondary | ICD-10-CM | POA: Diagnosis present

## 2021-07-18 DIAGNOSIS — M25511 Pain in right shoulder: Secondary | ICD-10-CM | POA: Diagnosis present

## 2021-07-18 DIAGNOSIS — C50411 Malignant neoplasm of upper-outer quadrant of right female breast: Secondary | ICD-10-CM | POA: Diagnosis present

## 2021-07-18 DIAGNOSIS — Z170 Estrogen receptor positive status [ER+]: Secondary | ICD-10-CM | POA: Insufficient documentation

## 2021-07-18 DIAGNOSIS — R293 Abnormal posture: Secondary | ICD-10-CM | POA: Diagnosis present

## 2021-07-18 DIAGNOSIS — M25611 Stiffness of right shoulder, not elsewhere classified: Secondary | ICD-10-CM | POA: Insufficient documentation

## 2021-07-18 DIAGNOSIS — I890 Lymphedema, not elsewhere classified: Secondary | ICD-10-CM | POA: Diagnosis present

## 2021-07-18 NOTE — Therapy (Signed)
[facility] @ [address] [place], [place], [address] Phone: [phone]   Fax:  [phone]  Physical Therapy Treatment  Patient Details  Name: Melody Rodriguez MRN: [id number] Date of Birth: 14-Jan-1976 Referring Provider (PT): Dr. [name]   Encounter Date: 07/18/2021   PT End of Session - 07/18/21 0913     Visit Number 13    Number of Visits 20    Date for PT Re-Evaluation 08/10/21    Authorization Type [employer] - needs auth after 20 visits    Authorization - Visit Number 13    Authorization - Number of Visits 20    PT Start Time 4010    PT Stop Time 0950    PT Time Calculation (min) 51 min    Activity Tolerance Patient tolerated treatment well    Behavior During Therapy [facility] for tasks assessed/performed             Past Medical History:  Diagnosis Date   Breast cancer ([place])    Family history of colon cancer     Past Surgical History:  Procedure Laterality Date   BREAST BIOPSY Right 02/18/2021   Procedure: EVACUATION HEMATOMA RIGHT AXILLA;  Surgeon: [name], MD;  Location: [facility];  Service: General;  Laterality: Right;   BREAST CYST EXCISION Right    Patient does not recall (2014 or 2015)   BREAST LUMPECTOMY WITH RADIOACTIVE SEED AND SENTINEL LYMPH NODE BIOPSY Right 02/07/2021   Procedure: RIGHT BREAST LUMPECTOMY WITH RADIOACTIVE SEED AND SEED TARGETED LYMPH NODE BIOPSY AND SENTINEL LYMPH NODE BIOPSY;  Surgeon: [name], MD;  Location: [facility];  Service: General;  Laterality: Right;   CESAREAN SECTION     IR IMAGING GUIDED PORT INSERTION  09/15/2020   IR REMOVAL TUN CV CATH W/O FL  09/15/2020   NODE DISSECTION Right 03/28/2021   Procedure: RIGHT AXILLARY LYMPH NODE DISSECTION;  Surgeon: [name], MD;  Location: [facility];  Service: General;  Laterality: Right;   PORT-A-CATH REMOVAL Left 02/07/2021   Procedure:  REMOVAL PORT-A-CATH;  Surgeon: [name], MD;  Location: [facility];  Service: General;  Laterality: Left;   THYROIDECTOMY, PARTIAL      There were no vitals filed for this visit.   Subjective Assessment - 07/18/21 0859     Subjective I completed radiation on 07/01/2021.   My skin is still itchy but not like it was.  Had first Zoladex without any repurcussions. Right arm feels heavy and I need to use 2 pillows or I get pain from my arm all the way to my neck. My arm does feel better with the TG soft, but it just hasn't improved since the first surgery.  The sharp pains are gone but  I feel like overall pain has regressed since the radiation because I am having trouble sleeping. I can't lay on my right side much. I feel more cording now too    Pertinent History Patient was diagnosed on 07/13/2020 with right grade 2 invasive ductal carcinoma breast cancer. It is ER/PR positive and HER2 negative with a Ki67 of 20%. Patient underwent neoadjuvant chemotherapy from 09/01/2020 - 01/14/2021. She had a right lumpectomy and sentinel node biopsy (1/1 nodes positive) on 02/07/2021. Hematoma evacuation on 02/18/2021. Axillary dissection on 03/28/2021 with 3/7 positive nodes. Iron deficiency. Radiation starts 05/10/2021    Patient Stated Goals Get my arm better    Currently in Pain? Yes  Pain Score 4     Pain Location Arm   Right neck and arm   Pain Orientation Right    Pain Descriptors / Indicators Heaviness;Aching    Pain Type Surgical pain    Pain Onset More than a month ago    Pain Frequency Constant    Aggravating Factors  sleeping on right, sleeping without pillow under arm.    Pain Relieving Factors sleeping with 2 pillows under arm                [facility] PT Assessment - 07/18/21 0001       Assessment   Medical Diagnosis s/p right lumpectomy and ALND    Referring Provider (PT) Dr. [name]    Onset Date/Surgical Date 02/07/21   And ALND on 7/11/02022   Hand  Dominance Right      Prior Function   Level of Independence Independent      Cognition   Overall Cognitive Status Within Functional Limits for tasks assessed      Observation/Other Assessments   Observations Dry skin/peeling noted at right breast, darkening at neck and posterior shoulder as well as breast, cording palpable but not visible at axillary region      AROM   Right Shoulder Extension 30 Degrees    Right Shoulder Flexion 130 Degrees    Right Shoulder ABduction 130 Degrees      Palpation   Palpation comment Tender at calvicular, axillary sternal and rib aspects of pectorals               LYMPHEDEMA/ONCOLOGY QUESTIONNAIRE - 07/18/21 0001       Type   Cancer Type Right breast cancer      Surgeries   Lumpectomy Date 02/07/21      Right Upper Extremity Lymphedema   10 cm Proximal to Olecranon Process 34 cm    Olecranon Process 27.4 cm    10 cm Proximal to Ulnar Styloid Process 23.3 cm    Just Proximal to Ulnar Styloid Process 16.9 cm    At Base of 2nd Digit 6.5 cm      Left Upper Extremity Lymphedema   10 cm Proximal to Olecranon Process 34 cm    Olecranon Process 26.8 cm    10 cm Proximal to Ulnar Styloid Process 23.2 cm    Just Proximal to Ulnar Styloid Process 16.9 cm    At Base of 2nd Digit 6.6 cm                        [facility] Adult PT Treatment/Exercise - 07/18/21 0001       Shoulder Exercises: Pulleys   Flexion 2 minutes    Flexion Limitations VCs to remind pt to relax Rt shoulder today and to sit with erect posture   towel roll behind back to improve posture   ABduction 2 minutes    ABduction Limitations VCs to remind pt to relax Rt shoulder   Towel roll behind back     Manual Therapy   Myofascial Release Gently to pts tolerance to Rt axilla, cording palpable here    Passive ROM In Supine to Rt shoulder into flexion, abduction and D2 to pts tolerance; pt with near full P/ROM by end of session today and cording very mildly palpated  only at end motions now in medial upper arm  PT Long Term Goals - 07/18/21 7322       PT LONG TERM GOAL #1   Title Patient will demonstrate she has regained full shoulder ROM and function post operatively compared to baselines.    Time 4    Period Weeks    Status On-going    Target Date 08/10/21      PT LONG TERM GOAL #2   Title Patient will increase right shoulder active flexion to >/= 120 degrees for inceased ease reaching overhead.    Time 4    Status Achieved      PT LONG TERM GOAL #3   Title Patient will increase her right shoulder active abduction to >/= 130 degrees to obtain radiation positioning.    Baseline 130    Time 4    Period Weeks    Status Achieved    Target Date 07/18/21      PT LONG TERM GOAL #4   Title Patient will improve her DASH score to </= 53 for improved arm function.    Time 4    Period Weeks    Status Achieved      PT LONG TERM GOAL #5   Title Patient will report she has a good understanding of lymphedema risk reduction practices.    Time 4    Period Weeks    Status Achieved      PT LONG TERM GOAL #6   Title Pt will be independent in a home exercise program for continued strengthening and stretching.    Time 4    Period Weeks      PT LONG TERM GOAL #7   Title Pt will report a 50% improvement in pain to allow her to clean the bathroom and do ADLs with improved comfort.    Time 4    Period Weeks    Status On-going      PT LONG TERM GOAL #8   Title Pt will be independent in self MLD for long term management of edema.    Time 4    Period Weeks    Status On-going    Target Date 08/10/21      PT LONG TERM GOAL  #9   TITLE Pt will be able to reach up in to the cabinet with her right arm to put up dishes to allow her to return to prior level of function.    Baseline uses L UE; pt able to use Rt arm about 50% of time with this now 07/18/2021    Time 4    Period Weeks    Status On-going    Target  Date 08/10/21                   Plan - 07/18/21 0913     Clinical Impression Statement pt was reassessed today as she was on hold until after she completed radiation.  AROM for shoulder flexion and abd is slightly reduced since last measurements.  Circumferential measurements are without concern today.  She does still have axillary cording which is palpble but not visible. She has tenderness through all borders of pectoralis, and skin sensitivity still mildly present since radiation. Pain has regressed since last visit and she is having difficulty lying supine unless she has 2 pillows under the right arm.  She uses her right arm only 50% of the time and the right arm feels very heavy.  She will continue to benefit from skilled PT to address deficits and return to  PLOF. She was advised to resume ROM activities and to wear compression bra if she can do so comfortably.    Personal Factors and Comorbidities Comorbidity 3+    Comorbidities muliple surgeries, chemotherapy    Examination-Activity Limitations Reach Overhead;Lift;Carry    Examination-Participation Restrictions Occupation    Stability/Clinical Decision Making Stable/Uncomplicated    Rehab Potential Good    PT Duration 4 weeks    PT Treatment/Interventions Therapeutic exercise;Patient/family education;Manual techniques;Manual lymph drainage;Scar mobilization;Passive range of motion;ADLs/Self Care Home Management;Orthotic Fit/Training;Vasopneumatic Device;Taping    PT Next Visit Plan If still having posterior shoulder pain try ionto??? Try to encourage pt to wear a sport bra (compression bra?) Cont PROM of Rt shoulder and manual therapy to Rt upper quadrant; also cont MLD to Rt breast and instruct pt in this, issuing handout; AAROM exercises with pulleys and ball roll up wall, measure for compression sleeve?/ cont every 3 month L-Dex screens for up to 2 years from her SLNB    PT Home Exercise Plan Post op shoulder ROM HEP, supine dowel  exercises    Recommended Other Services compression sleeve    Consulted and Agree with Plan of Care Patient             Patient will benefit from skilled therapeutic intervention in order to improve the following deficits and impairments:  Postural dysfunction, Decreased range of motion, Pain, Impaired UE functional use, Decreased knowledge of precautions, Increased fascial restricitons, Decreased scar mobility, Increased edema, Decreased strength  Visit Diagnosis: Lymphedema, not elsewhere classified  Disorder of the skin and subcutaneous tissue related to radiation, unspecified  Aftercare following surgery for neoplasm  Stiffness of right shoulder, not elsewhere classified  Acute pain of right shoulder  Abnormal posture  Malignant neoplasm of upper-outer quadrant of right breast in female, estrogen receptor positive ([place])     Problem List Patient Active Problem List   Diagnosis Date Noted   Bartholin cyst 05/06/2021   Amenorrhea, secondary 03/02/2021   PICC (peripherally inserted central catheter) in place 09/09/2020   Genetic testing 08/11/2020   Iron deficiency anemia due to chronic blood loss 08/04/2020   Family history of colon cancer    Malignant neoplasm of upper-outer quadrant of right breast in female, estrogen receptor positive ([place]) 07/28/2020    Claris Pong, PT 07/18/2021, 9:59 AM  [facility] @ [address] [place], [place], [address] Phone: [phone]   Fax:  [phone]  Name: Yoceline Bazar MRN: [id number] Date of Birth: 02-Sep-1976

## 2021-07-20 ENCOUNTER — Other Ambulatory Visit: Payer: Self-pay

## 2021-07-20 ENCOUNTER — Ambulatory Visit: Payer: Medicaid Other | Attending: Surgery

## 2021-07-20 DIAGNOSIS — L599 Disorder of the skin and subcutaneous tissue related to radiation, unspecified: Secondary | ICD-10-CM | POA: Diagnosis present

## 2021-07-20 DIAGNOSIS — C50411 Malignant neoplasm of upper-outer quadrant of right female breast: Secondary | ICD-10-CM | POA: Diagnosis present

## 2021-07-20 DIAGNOSIS — Z170 Estrogen receptor positive status [ER+]: Secondary | ICD-10-CM | POA: Insufficient documentation

## 2021-07-20 DIAGNOSIS — R293 Abnormal posture: Secondary | ICD-10-CM | POA: Insufficient documentation

## 2021-07-20 DIAGNOSIS — M25611 Stiffness of right shoulder, not elsewhere classified: Secondary | ICD-10-CM | POA: Insufficient documentation

## 2021-07-20 DIAGNOSIS — M25511 Pain in right shoulder: Secondary | ICD-10-CM | POA: Insufficient documentation

## 2021-07-20 DIAGNOSIS — Z483 Aftercare following surgery for neoplasm: Secondary | ICD-10-CM | POA: Insufficient documentation

## 2021-07-20 DIAGNOSIS — I890 Lymphedema, not elsewhere classified: Secondary | ICD-10-CM | POA: Insufficient documentation

## 2021-07-20 NOTE — Therapy (Signed)
[facility] @ [place], [place], [address] Phone: [phone]   Fax:  [phone]  Physical Therapy Treatment  Patient Details  Name: Melody Rodriguez MRN: [id number] Date of Birth: September 24, 1975 Referring Provider (PT): Dr. [name]   Encounter Date: 07/20/2021   PT End of Session - 07/20/21 1245     Visit Number 14    Number of Visits 20    Date for PT Re-Evaluation 08/10/21    Authorization Type [employer] - needs auth after 20 visits    Authorization - Visit Number 14    Authorization - Number of Visits 20    PT Start Time 0910    PT Stop Time 1005    PT Time Calculation (min) 55 min    Activity Tolerance Patient tolerated treatment well    Behavior During Therapy [facility] for tasks assessed/performed             Past Medical History:  Diagnosis Date   Breast cancer ([place])    Family history of colon cancer     Past Surgical History:  Procedure Laterality Date   BREAST BIOPSY Right 02/18/2021   Procedure: EVACUATION HEMATOMA RIGHT AXILLA;  Surgeon: [name], MD;  Location: [place];  Service: General;  Laterality: Right;   BREAST CYST EXCISION Right    Patient does not recall (2014 or 2015)   BREAST LUMPECTOMY WITH RADIOACTIVE SEED AND SENTINEL LYMPH NODE BIOPSY Right 02/07/2021   Procedure: RIGHT BREAST LUMPECTOMY WITH RADIOACTIVE SEED AND SEED TARGETED LYMPH NODE BIOPSY AND SENTINEL LYMPH NODE BIOPSY;  Surgeon: [name], MD;  Location: [place];  Service: General;  Laterality: Right;   CESAREAN SECTION     IR IMAGING GUIDED PORT INSERTION  09/15/2020   IR REMOVAL TUN CV CATH W/O FL  09/15/2020   NODE DISSECTION Right 03/28/2021   Procedure: RIGHT AXILLARY LYMPH NODE DISSECTION;  Surgeon: [name], MD;  Location: [place];  Service: General;  Laterality: Right;   PORT-A-CATH REMOVAL Left 02/07/2021   Procedure:  REMOVAL PORT-A-CATH;  Surgeon: [name], MD;  Location: [place];  Service: General;  Laterality: Left;   THYROIDECTOMY, PARTIAL      There were no vitals filed for this visit.   Subjective Assessment - 07/20/21 0912     Subjective My Rt arm bothers me all the time and it's so frustrating.    Pertinent History Patient was diagnosed on 07/13/2020 with right grade 2 invasive ductal carcinoma breast cancer. It is ER/PR positive and HER2 negative with a Ki67 of 20%. Patient underwent neoadjuvant chemotherapy from 09/01/2020 - 01/14/2021. She had a right lumpectomy and sentinel node biopsy (1/1 nodes positive) on 02/07/2021. Hematoma evacuation on 02/18/2021. Axillary dissection on 03/28/2021 with 3/7 positive nodes. Iron deficiency. Radiation starts 05/10/2021    Patient Stated Goals Get my arm better    Currently in Pain? Yes    Pain Score 8     Pain Location Arm    Pain Orientation Right    Pain Descriptors / Indicators Aching;Burning;Stabbing;Heaviness    Pain Type Surgical pain    Pain Onset More than a month ago    Aggravating Factors  laying on it    Pain Relieving Factors sleeping with pillows under arm  [facility] Adult PT Treatment/Exercise - 07/20/21 0001       Manual Therapy   Myofascial Release Gently to pts tolerance to Rt axilla, cording palpable here    Manual Lymphatic Drainage (MLD) In Supine: Short neck, superficial and deep abdominals, Lt axillary nodes, anterior inter-axillary anastomosis, Rt inguinal nodes and Rt axillo-inguinal anastomosis, then focused on Rt breast and UE,working from proximal to distal then redirecting towards anastomosis    Passive ROM In Supine to Rt shoulder into flexion, abduction and D2 to pts tolerance                          PT Long Term Goals - 07/18/21 9323       PT LONG TERM GOAL #1   Title Patient will demonstrate she has regained full shoulder ROM and  function post operatively compared to baselines.    Time 4    Period Weeks    Status On-going    Target Date 08/10/21      PT LONG TERM GOAL #2   Title Patient will increase right shoulder active flexion to >/= 120 degrees for inceased ease reaching overhead.    Time 4    Status Achieved      PT LONG TERM GOAL #3   Title Patient will increase her right shoulder active abduction to >/= 130 degrees to obtain radiation positioning.    Baseline 130    Time 4    Period Weeks    Status Achieved    Target Date 07/18/21      PT LONG TERM GOAL #4   Title Patient will improve her DASH score to </= 53 for improved arm function.    Time 4    Period Weeks    Status Achieved      PT LONG TERM GOAL #5   Title Patient will report she has a good understanding of lymphedema risk reduction practices.    Time 4    Period Weeks    Status Achieved      PT LONG TERM GOAL #6   Title Pt will be independent in a home exercise program for continued strengthening and stretching.    Time 4    Period Weeks      PT LONG TERM GOAL #7   Title Pt will report a 50% improvement in pain to allow her to clean the bathroom and do ADLs with improved comfort.    Time 4    Period Weeks    Status On-going      PT LONG TERM GOAL #8   Title Pt will be independent in self MLD for long term management of edema.    Time 4    Period Weeks    Status On-going    Target Date 08/10/21      PT LONG TERM GOAL  #9   TITLE Pt will be able to reach up in to the cabinet with her right arm to put up dishes to allow her to return to prior level of function.    Baseline uses L UE; pt able to use Rt arm about 50% of time with this now 07/18/2021    Time 4    Period Weeks    Status On-going    Target Date 08/10/21                   Plan - 07/20/21 1247     Clinical Impression Statement Resumed manual therapy  working on decreasing fascial restrictions at Rt axilla with P/ROM and MFR. Also resumed manual lymph  drainage of Rt UE throughout session as pt reports her arm feels heavy. She is very guarded and slow to relax (due to the muscle guarding) with all P/ROM but improvement was noted by end of session with increased P/ROM.    Personal Factors and Comorbidities Comorbidity 3+    Comorbidities muliple surgeries, chemotherapy    Examination-Activity Limitations Reach Overhead;Lift;Carry    Examination-Participation Restrictions Occupation    Stability/Clinical Decision Making Stable/Uncomplicated    Rehab Potential Good    PT Frequency 2x / week    PT Duration 4 weeks    PT Treatment/Interventions Therapeutic exercise;Patient/family education;Manual techniques;Manual lymph drainage;Scar mobilization;Passive range of motion;ADLs/Self Care Home Management;Orthotic Fit/Training;Vasopneumatic Device;Taping    PT Next Visit Plan Try to encourage pt to wear a sport bra (compression bra?) Cont PROM of Rt shoulder and manual therapy to Rt upper quadrant; also cont MLD to Rt breast and instruct pt in this, issuing handout; AAROM exercises with pulleys and ball roll up wall, measure for compression sleeve?/ cont every 3 month L-Dex screens for up to 2 years from her SLNB    PT Home Exercise Plan Post op shoulder ROM HEP, supine dowel exercises    Consulted and Agree with Plan of Care Patient             Patient will benefit from skilled therapeutic intervention in order to improve the following deficits and impairments:  Postural dysfunction, Decreased range of motion, Pain, Impaired UE functional use, Decreased knowledge of precautions, Increased fascial restricitons, Decreased scar mobility, Increased edema, Decreased strength  Visit Diagnosis: Lymphedema, not elsewhere classified  Disorder of the skin and subcutaneous tissue related to radiation, unspecified  Aftercare following surgery for neoplasm  Stiffness of right shoulder, not elsewhere classified  Acute pain of right shoulder  Abnormal  posture  Malignant neoplasm of upper-outer quadrant of right breast in female, estrogen receptor positive ([place])     Problem List Patient Active Problem List   Diagnosis Date Noted   Bartholin cyst 05/06/2021   Amenorrhea, secondary 03/02/2021   PICC (peripherally inserted central catheter) in place 09/09/2020   Genetic testing 08/11/2020   Iron deficiency anemia due to chronic blood loss 08/04/2020   Family history of colon cancer    Malignant neoplasm of upper-outer quadrant of right breast in female, estrogen receptor positive ([place]) 07/28/2020    Otelia Limes, PTA 07/20/2021, 12:51 PM  [facility] @ [place], [place], [address] Phone: [phone]   Fax:  [phone]  Name: Melody Rodriguez MRN: [id number] Date of Birth: [birth year]

## 2021-07-25 ENCOUNTER — Other Ambulatory Visit: Payer: Self-pay

## 2021-07-25 ENCOUNTER — Ambulatory Visit: Payer: Medicaid Other

## 2021-07-25 ENCOUNTER — Other Ambulatory Visit: Payer: Self-pay | Admitting: *Deleted

## 2021-07-25 DIAGNOSIS — I890 Lymphedema, not elsewhere classified: Secondary | ICD-10-CM

## 2021-07-25 DIAGNOSIS — Z170 Estrogen receptor positive status [ER+]: Secondary | ICD-10-CM

## 2021-07-25 DIAGNOSIS — M25611 Stiffness of right shoulder, not elsewhere classified: Secondary | ICD-10-CM

## 2021-07-25 DIAGNOSIS — R293 Abnormal posture: Secondary | ICD-10-CM

## 2021-07-25 DIAGNOSIS — Z483 Aftercare following surgery for neoplasm: Secondary | ICD-10-CM

## 2021-07-25 DIAGNOSIS — M25511 Pain in right shoulder: Secondary | ICD-10-CM

## 2021-07-25 DIAGNOSIS — L599 Disorder of the skin and subcutaneous tissue related to radiation, unspecified: Secondary | ICD-10-CM

## 2021-07-25 NOTE — Therapy (Signed)
[facility] @ [place], [place], [address] Phone: [phone]   Fax:  [phone]  Physical Therapy Treatment  Patient Details  Name: Melody Rodriguez MRN: [id number] Date of Birth: [birth year] Referring Provider (PT): Dr. [name]   Encounter Date: 07/25/2021   PT End of Session - 07/25/21 1021     Visit Number 15    Number of Visits 20    Date for PT Re-Evaluation 08/10/21    Authorization Type [employer] - needs auth after 20 visits    Authorization - Visit Number 15    Authorization - Number of Visits 20    PT Start Time 1003    PT Stop Time 1100    PT Time Calculation (min) 57 min    Activity Tolerance Patient tolerated treatment well    Behavior During Therapy [facility] for tasks assessed/performed             Past Medical History:  Diagnosis Date   Breast cancer ([place])    Family history of colon cancer     Past Surgical History:  Procedure Laterality Date   BREAST BIOPSY Right 02/18/2021   Procedure: EVACUATION HEMATOMA RIGHT AXILLA;  Surgeon: [name], MD;  Location: [place];  Service: General;  Laterality: Right;   BREAST CYST EXCISION Right    Patient does not recall (2014 or 2015)   BREAST LUMPECTOMY WITH RADIOACTIVE SEED AND SENTINEL LYMPH NODE BIOPSY Right 02/07/2021   Procedure: RIGHT BREAST LUMPECTOMY WITH RADIOACTIVE SEED AND SEED TARGETED LYMPH NODE BIOPSY AND SENTINEL LYMPH NODE BIOPSY;  Surgeon: [name], MD;  Location: [facility];  Service: General;  Laterality: Right;   CESAREAN SECTION     IR IMAGING GUIDED PORT INSERTION  09/15/2020   IR REMOVAL TUN CV CATH W/O FL  09/15/2020   NODE DISSECTION Right 03/28/2021   Procedure: RIGHT AXILLARY LYMPH NODE DISSECTION;  Surgeon: [name], MD;  Location: [place];  Service: General;  Laterality: Right;   PORT-A-CATH REMOVAL Left 02/07/2021   Procedure:  REMOVAL PORT-A-CATH;  Surgeon: [name], MD;  Location: [facility];  Service: General;  Laterality: Left;   THYROIDECTOMY, PARTIAL      There were no vitals filed for this visit.   Subjective Assessment - 07/25/21 1006     Subjective My Rt arm is just not doing good. I'm not opposed to seeing an orthopedist. I do want to keep coming until I can possibly see a doctor though because I do feel better when I leave here.    Pertinent History Patient was diagnosed on 07/13/2020 with right grade 2 invasive ductal carcinoma breast cancer. It is ER/PR positive and HER2 negative with a Ki67 of 20%. Patient underwent neoadjuvant chemotherapy from 09/01/2020 - 01/14/2021. She had a right lumpectomy and sentinel node biopsy (1/1 nodes positive) on 02/07/2021. Hematoma evacuation on 02/18/2021. Axillary dissection on 03/28/2021 with 3/7 positive nodes. Iron deficiency. Radiation starts 05/10/2021    Patient Stated Goals Get my arm better    Currently in Pain? Yes    Pain Score 6     Pain Location Shoulder    Pain Orientation Right    Pain Descriptors / Indicators Aching    Pain Type Surgical pain    Pain Onset More than a month ago    Pain Frequency Constant    Aggravating Factors  it just always hurts but laying on it doesn't help  Pain Relieving Factors sleeping with pillows under arm but even that only helps for awhile                [facility] PT Assessment - 07/25/21 0001       AROM   Right Shoulder Flexion 120 Degrees    Right Shoulder ABduction 118 Degrees   with pain and compensation                          [facility] Adult PT Treatment/Exercise - 07/25/21 0001       Shoulder Exercises: Pulleys   Flexion 3 minutes    Flexion Limitations VCs to decrease Rt scapular compensation    ABduction 2 minutes    ABduction Limitations VCs to relax Rt shoulder      Manual Therapy   Myofascial Release To Rt axilla, cording palpable here but improved today     Manual Lymphatic Drainage (MLD) In Supine: Short neck, superficial and deep abdominals, Lt axillary nodes, anterior inter-axillary anastomosis, Rt inguinal nodes and Rt axillo-inguinal anastomosis, then focused on Rt breast and UE,working from proximal to distal then redirecting towards anastomosis    Passive ROM In Supine to Rt shoulder into flexion and abduction to pts tolerance                          PT Long Term Goals - 07/18/21 3329       PT LONG TERM GOAL #1   Title Patient will demonstrate she has regained full shoulder ROM and function post operatively compared to baselines.    Time 4    Period Weeks    Status On-going    Target Date 08/10/21      PT LONG TERM GOAL #2   Title Patient will increase right shoulder active flexion to >/= 120 degrees for inceased ease reaching overhead.    Time 4    Status Achieved      PT LONG TERM GOAL #3   Title Patient will increase her right shoulder active abduction to >/= 130 degrees to obtain radiation positioning.    Baseline 130    Time 4    Period Weeks    Status Achieved    Target Date 07/18/21      PT LONG TERM GOAL #4   Title Patient will improve her DASH score to </= 53 for improved arm function.    Time 4    Period Weeks    Status Achieved      PT LONG TERM GOAL #5   Title Patient will report she has a good understanding of lymphedema risk reduction practices.    Time 4    Period Weeks    Status Achieved      PT LONG TERM GOAL #6   Title Pt will be independent in a home exercise program for continued strengthening and stretching.    Time 4    Period Weeks      PT LONG TERM GOAL #7   Title Pt will report a 50% improvement in pain to allow her to clean the bathroom and do ADLs with improved comfort.    Time 4    Period Weeks    Status On-going      PT LONG TERM GOAL #8   Title Pt will be independent in self MLD for long term management of edema.    Time 4    Period Weeks  Status On-going     Target Date 08/10/21      PT LONG TERM GOAL  #9   TITLE Pt will be able to reach up in to the cabinet with her right arm to put up dishes to allow her to return to prior level of function.    Baseline uses L UE; pt able to use Rt arm about 50% of time with this now 07/18/2021    Time 4    Period Weeks    Status On-going    Target Date 08/10/21                   Plan - 07/25/21 1021     Clinical Impression Statement Discussed current functional status with pt and she is still having pain with sleeping and most ADLs. Her A/ROM has also decreased since last time measured and she reports not overall feeling much improved. She would like to pursue seeing an orthopedist at this time so inbox message sent to [facility] requesting a referral for this. Also discussed with pt working to wear her compression bra on a more consistent basis. She wore it all day Friday and had sharp pains by end of day so advised her to try wearing bra for 4-5 hrs/day for awhile to get used to it then progress to wearing longer and longer. Pt verbalized good understanding of this. Today continued with manual therapy working to decrease tightness at Rt axilla and shoulder as pt could tolerate. She does have improved end P/ROM as her flexion was near full with cording less palpable and less visible than it's been.    Personal Factors and Comorbidities Comorbidity 3+    Comorbidities muliple surgeries, chemotherapy    Examination-Activity Limitations Reach Overhead;Lift;Carry    Examination-Participation Restrictions Occupation    Stability/Clinical Decision Making Stable/Uncomplicated    Rehab Potential Good    PT Frequency 2x / week    PT Duration 4 weeks    PT Treatment/Interventions Therapeutic exercise;Patient/family education;Manual techniques;Manual lymph drainage;Scar mobilization;Passive range of motion;ADLs/Self Care Home Management;Orthotic Fit/Training;Vasopneumatic Device;Taping    PT Next Visit Plan  Did pt hear about orthopedist referral? Cont PROM of Rt shoulder and manual therapy to Rt upper quadrant; also cont MLD to Rt breast and instruct pt in this, issuing handout; AAROM exercises with pulleys and ball roll up wall, measure for compression sleeve?/ cont every 3 month L-Dex screens for up to 2 years from her SLNB    PT Home Exercise Plan Post op shoulder ROM HEP, supine dowel exercises    Consulted and Agree with Plan of Care Patient             Patient will benefit from skilled therapeutic intervention in order to improve the following deficits and impairments:  Postural dysfunction, Decreased range of motion, Pain, Impaired UE functional use, Decreased knowledge of precautions, Increased fascial restricitons, Decreased scar mobility, Increased edema, Decreased strength  Visit Diagnosis: Lymphedema, not elsewhere classified  Disorder of the skin and subcutaneous tissue related to radiation, unspecified  Aftercare following surgery for neoplasm  Stiffness of right shoulder, not elsewhere classified  Acute pain of right shoulder  Abnormal posture  Malignant neoplasm of upper-outer quadrant of right breast in female, estrogen receptor positive ([place])     Problem List Patient Active Problem List   Diagnosis Date Noted   Bartholin cyst 05/06/2021   Amenorrhea, secondary 03/02/2021   PICC (peripherally inserted central catheter) in place 09/09/2020   Genetic testing 08/11/2020  Iron deficiency anemia due to chronic blood loss 08/04/2020   Family history of colon cancer    Malignant neoplasm of upper-outer quadrant of right breast in female, estrogen receptor positive ([place]) 07/28/2020    Otelia Limes, PTA 07/25/2021, 12:32 PM  [facility] @ [place], [place], [address] Phone: [phone]   Fax:  [phone]  Name: Melody Rodriguez MRN: [id number] Date of Birth: [birth year]

## 2021-07-25 NOTE — Progress Notes (Signed)
Referral made to orthopedic surgery for right shoulder pain and limited ROM.

## 2021-07-26 ENCOUNTER — Encounter: Payer: Self-pay | Admitting: Hematology and Oncology

## 2021-07-27 ENCOUNTER — Ambulatory Visit: Payer: Medicaid Other

## 2021-07-27 ENCOUNTER — Other Ambulatory Visit: Payer: Self-pay

## 2021-07-27 ENCOUNTER — Ambulatory Visit (INDEPENDENT_AMBULATORY_CARE_PROVIDER_SITE_OTHER): Payer: Medicaid Other | Admitting: Orthopaedic Surgery

## 2021-07-27 ENCOUNTER — Ambulatory Visit (INDEPENDENT_AMBULATORY_CARE_PROVIDER_SITE_OTHER): Payer: Medicaid Other

## 2021-07-27 DIAGNOSIS — G8929 Other chronic pain: Secondary | ICD-10-CM

## 2021-07-27 DIAGNOSIS — R293 Abnormal posture: Secondary | ICD-10-CM

## 2021-07-27 DIAGNOSIS — M25511 Pain in right shoulder: Secondary | ICD-10-CM | POA: Diagnosis not present

## 2021-07-27 DIAGNOSIS — I890 Lymphedema, not elsewhere classified: Secondary | ICD-10-CM

## 2021-07-27 DIAGNOSIS — M25611 Stiffness of right shoulder, not elsewhere classified: Secondary | ICD-10-CM

## 2021-07-27 DIAGNOSIS — Z483 Aftercare following surgery for neoplasm: Secondary | ICD-10-CM

## 2021-07-27 DIAGNOSIS — L599 Disorder of the skin and subcutaneous tissue related to radiation, unspecified: Secondary | ICD-10-CM

## 2021-07-27 DIAGNOSIS — Z170 Estrogen receptor positive status [ER+]: Secondary | ICD-10-CM

## 2021-07-27 MED ORDER — LIDOCAINE HCL 1 % IJ SOLN
3.0000 mL | INTRAMUSCULAR | Status: AC | PRN
  Administered 2021-07-27: 3 mL

## 2021-07-27 MED ORDER — METHYLPREDNISOLONE ACETATE 40 MG/ML IJ SUSP
40.0000 mg | INTRAMUSCULAR | Status: AC | PRN
  Administered 2021-07-27: 40 mg via INTRA_ARTICULAR

## 2021-07-27 NOTE — Progress Notes (Signed)
Office Visit Note   Patient: Melody Rodriguez           Date of Birth: June 19, 1976           MRN: [id number] Visit Date: 07/27/2021              Requested by: Nicholas Lose, MD [address] [place],  [place] [address] PCP: Patient, [name]   Assessment & Plan: Visit Diagnoses:  1. 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  I explained this to her in detail and this is very common after this type of surgery that she had in terms of breast surgery.  I agree with her continuing physical therapy.  I did place a steroid injection in the right shoulder subacromial outlet today but it is also essential we set her up for an intra-articular right shoulder steroid joint injection by Dr. Ernestina Patches.  She agrees with this.  We will work on getting that scheduled and I will see her back in 4 weeks from now to see how she is doing overall.  All questions and concerns were answered and addressed.  Follow-Up Instructions: Return in about 4 weeks (around 08/24/2021).   Orders:  Orders Placed This Encounter  Procedures   Large Joint Inj   XR Shoulder Right   No orders of the defined types were placed in this encounter.     Procedures: Large Joint Inj: R subacromial bursa on 07/27/2021 9:04 AM Indications: pain and diagnostic evaluation Details: 22 G 1.5 in needle  Arthrogram: No  Medications: 3 mL lidocaine 1 %; 40 mg methylPREDNISolone acetate 40 MG/ML Outcome: tolerated well, no immediate complications Procedure, treatment alternatives, risks and benefits explained, specific risks discussed. Consent was given by the patient. Immediately prior to procedure a time out was called to verify the correct patient, procedure, equipment, support staff and site/side marked as required. Patient was prepped and draped in the usual sterile fashion.      Clinical Data: No additional  findings.   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  She is currently participating in physical therapy on her right shoulder.  She is having problems reaching behind her and overhead.  There is been no known injury.  She is only borderline diabetic and not on medications for this.  She is right-hand dominant as well.  HPI  Review of Systems There is currently listed no headache, chest pain, shortness of breath, fever, chills, nausea, vomiting  Objective: Vital Signs: There were no vitals taken for this visit.  Physical Exam She is alert and orient x3 and in no acute distress Ortho Exam Examination of her right shoulder shows limitations in forward flexion and abduction.  Reaching behind her is also only to the lower lumbar spine.  Her left shoulder moves fluidly and fully. Specialty Comments:  No specialty comments available.  Imaging: XR Shoulder Right  Result Date: 07/27/2021 3 views of the right shoulder show no acute findings.    PMFS History: Patient Active Problem List   Diagnosis Date Noted   Bartholin cyst 05/06/2021   Amenorrhea, secondary 03/02/2021   PICC (peripherally inserted central catheter) in place 09/09/2020   Genetic testing 08/11/2020   Iron deficiency  anemia due to chronic blood loss 08/04/2020   Family history of colon cancer    Malignant neoplasm of upper-outer quadrant of right breast in female, estrogen receptor positive ([place]) 07/28/2020   Past Medical History:  Diagnosis Date   Breast cancer ([place])    Family history of colon cancer     Family History  Problem Relation Age of Onset   Hypertension Mother    Colon cancer Sister 39   Diabetes Maternal Grandmother    Cancer Maternal Aunt        unknown type dx late 64s    Past Surgical History:  Procedure Laterality  Date   BREAST BIOPSY Right 02/18/2021   Procedure: EVACUATION HEMATOMA RIGHT AXILLA;  Surgeon: [name], MD;  Location: [place];  Service: General;  Laterality: Right;   BREAST CYST EXCISION Right    Patient does not recall (2014 or 2015)   BREAST LUMPECTOMY WITH RADIOACTIVE SEED AND SENTINEL LYMPH NODE BIOPSY Right 02/07/2021   Procedure: RIGHT BREAST LUMPECTOMY WITH RADIOACTIVE SEED AND SEED TARGETED LYMPH NODE BIOPSY AND SENTINEL LYMPH NODE BIOPSY;  Surgeon: [name], MD;  Location: [facility];  Service: General;  Laterality: Right;   CESAREAN SECTION     IR IMAGING GUIDED PORT INSERTION  09/15/2020   IR REMOVAL TUN CV CATH W/O FL  09/15/2020   NODE DISSECTION Right 03/28/2021   Procedure: RIGHT AXILLARY LYMPH NODE DISSECTION;  Surgeon: [name], MD;  Location: [place];  Service: General;  Laterality: Right;   PORT-A-CATH REMOVAL Left 02/07/2021   Procedure: REMOVAL PORT-A-CATH;  Surgeon: [name], MD;  Location: [place];  Service: General;  Laterality: Left;   THYROIDECTOMY, PARTIAL     Social History   Occupational History   Not on file  Tobacco Use   Smoking status: Former    Packs/day: 1.00    Types: Cigars, Cigarettes   Smokeless tobacco: Never   Tobacco comments:    quit 07/2020  Vaping Use   Vaping Use: Never used  Substance and Sexual Activity   Alcohol use: Yes    Comment: occas   Drug use: Yes    Types: Marijuana    Comment: chemo/ pain mgmt   Sexual activity: Not Currently

## 2021-07-27 NOTE — Therapy (Signed)
[facility] @ [place], [place], [address] Phone: [phone]   Fax:  [phone]  Physical Therapy Treatment  Patient Details  Name: Melody Rodriguez MRN: [id number] Date of Birth: [birth year] Referring Provider (PT): Dr. [name]   Encounter Date: 07/27/2021   PT End of Session - 07/27/21 1037     Visit Number 15   # unchanged due to held treatment today   Number of Visits 20    Date for PT Re-Evaluation 08/10/21    Authorization Type [employer] - needs auth after 20 visits    Authorization - Visit Number 15    Authorization - Number of Visits 20    PT Start Time 1006    PT Stop Time 1018    PT Time Calculation (min) 12 min    Activity Tolerance Treatment limited secondary to medical complications (Comment)    Behavior During Therapy [facility] for tasks assessed/performed             Past Medical History:  Diagnosis Date   Breast cancer ([place])    Family history of colon cancer     Past Surgical History:  Procedure Laterality Date   BREAST BIOPSY Right 02/18/2021   Procedure: EVACUATION HEMATOMA RIGHT AXILLA;  Surgeon: [name], MD;  Location: [facility];  Service: General;  Laterality: Right;   BREAST CYST EXCISION Right    Patient does not recall (2014 or 2015)   BREAST LUMPECTOMY WITH RADIOACTIVE SEED AND SENTINEL LYMPH NODE BIOPSY Right 02/07/2021   Procedure: RIGHT BREAST LUMPECTOMY WITH RADIOACTIVE SEED AND SEED TARGETED LYMPH NODE BIOPSY AND SENTINEL LYMPH NODE BIOPSY;  Surgeon: [name], MD;  Location: [facility];  Service: General;  Laterality: Right;   CESAREAN SECTION     IR IMAGING GUIDED PORT INSERTION  09/15/2020   IR REMOVAL TUN CV CATH W/O FL  09/15/2020   NODE DISSECTION Right 03/28/2021   Procedure: RIGHT AXILLARY LYMPH NODE DISSECTION;  Surgeon: [name], MD;  Location: [facility];  Service: General;   Laterality: Right;   PORT-A-CATH REMOVAL Left 02/07/2021   Procedure: REMOVAL PORT-A-CATH;  Surgeon: [name], MD;  Location: [facility];  Service: General;  Laterality: Left;   THYROIDECTOMY, PARTIAL      There were no vitals filed for this visit.   Subjective Assessment - 07/27/21 1024     Subjective I was able to see Dr. [name] this morning and he did x-rays and said there was no damage to my shoulder. He did say it may be frozen shoulder so he gave me a cortisone shot right before I came here and it just hurts now.    Pertinent History Patient was diagnosed on 07/13/2020 with right grade 2 invasive ductal carcinoma breast cancer. It is ER/PR positive and HER2 negative with a Ki67 of 20%. Patient underwent neoadjuvant chemotherapy from 09/01/2020 - 01/14/2021. She had a right lumpectomy and sentinel node biopsy (1/1 nodes positive) on 02/07/2021. Hematoma evacuation on 02/18/2021. Axillary dissection on 03/28/2021 with 3/7 positive nodes. Iron deficiency. Radiation starts 05/10/2021                                             PT Long Term Goals - 07/18/21 0928       PT LONG TERM GOAL #1  Title Patient will demonstrate she has regained full shoulder ROM and function post operatively compared to baselines.    Time 4    Period Weeks    Status On-going    Target Date 08/10/21      PT LONG TERM GOAL #2   Title Patient will increase right shoulder active flexion to >/= 120 degrees for inceased ease reaching overhead.    Time 4    Status Achieved      PT LONG TERM GOAL #3   Title Patient will increase her right shoulder active abduction to >/= 130 degrees to obtain radiation positioning.    Baseline 130    Time 4    Period Weeks    Status Achieved    Target Date 07/18/21      PT LONG TERM GOAL #4   Title Patient will improve her DASH score to </= 53 for improved arm function.    Time 4    Period Weeks    Status Achieved       PT LONG TERM GOAL #5   Title Patient will report she has a good understanding of lymphedema risk reduction practices.    Time 4    Period Weeks    Status Achieved      PT LONG TERM GOAL #6   Title Pt will be independent in a home exercise program for continued strengthening and stretching.    Time 4    Period Weeks      PT LONG TERM GOAL #7   Title Pt will report a 50% improvement in pain to allow her to clean the bathroom and do ADLs with improved comfort.    Time 4    Period Weeks    Status On-going      PT LONG TERM GOAL #8   Title Pt will be independent in self MLD for long term management of edema.    Time 4    Period Weeks    Status On-going    Target Date 08/10/21      PT LONG TERM GOAL  #9   TITLE Pt will be able to reach up in to the cabinet with her right arm to put up dishes to allow her to return to prior level of function.    Baseline uses L UE; pt able to use Rt arm about 50% of time with this now 07/18/2021    Time 4    Period Weeks    Status On-going    Target Date 08/10/21                   Plan - 07/27/21 1038     Clinical Impression Statement Pt has just come from seeing orthopedist Dr/ [name] this morning who has diagnosed her with possible frozen shoulder. She reports he gave her a cortisone shot so will hold physical therapy today as she should limit motion for 24 hours and has increased pain from shot. Pt is agreeable to this.    PT Next Visit Plan Has is Rt shoulder since cortisone shot Wed morning? Cont PROM of Rt shoulder and manual therapy to Rt upper quadrant; also cont MLD to Rt breast and instruct pt in this, issuing handout; AAROM exercises with pulleys and ball roll up wall, measure for compression sleeve? cont every 3 month L-Dex screens for up to 2 years from her SLNB    PT Home Exercise Plan Post op shoulder ROM HEP, supine dowel exercises  Consulted and Agree with Plan of Care Patient             Patient will benefit  from skilled therapeutic intervention in order to improve the following deficits and impairments:     Visit Diagnosis: Lymphedema, not elsewhere classified  Disorder of the skin and subcutaneous tissue related to radiation, unspecified  Aftercare following surgery for neoplasm  Stiffness of right shoulder, not elsewhere classified  Acute pain of right shoulder  Abnormal posture  Malignant neoplasm of upper-outer quadrant of right breast in female, estrogen receptor positive ([place])     Problem List Patient Active Problem List   Diagnosis Date Noted   Bartholin cyst 05/06/2021   Amenorrhea, secondary 03/02/2021   PICC (peripherally inserted central catheter) in place 09/09/2020   Genetic testing 08/11/2020   Iron deficiency anemia due to chronic blood loss 08/04/2020   Family history of colon cancer    Malignant neoplasm of upper-outer quadrant of right breast in female, estrogen receptor positive ([place]) 07/28/2020    Otelia Limes, PTA 07/27/2021, 10:41 AM  [facility] @ [place], [place], [address] Phone: [phone]   Fax:  [phone]  Name: Nhung Danko MRN: [id number] Date of Birth: [birth year]

## 2021-07-28 ENCOUNTER — Encounter: Payer: Self-pay | Admitting: Radiology

## 2021-08-01 ENCOUNTER — Other Ambulatory Visit (HOSPITAL_COMMUNITY): Payer: Self-pay

## 2021-08-01 ENCOUNTER — Other Ambulatory Visit: Payer: Self-pay | Admitting: *Deleted

## 2021-08-01 ENCOUNTER — Ambulatory Visit (HOSPITAL_COMMUNITY)
Admission: RE | Admit: 2021-08-01 | Discharge: 2021-08-01 | Disposition: A | Discharge status: Home / Self Care | Payer: Medicaid Other | Source: Ambulatory Visit | Attending: Physician Assistant | Admitting: Physician Assistant

## 2021-08-01 ENCOUNTER — Inpatient Hospital Stay: Payer: Medicaid Other | Attending: Hematology and Oncology

## 2021-08-01 ENCOUNTER — Other Ambulatory Visit: Payer: Self-pay

## 2021-08-01 ENCOUNTER — Inpatient Hospital Stay: Payer: Medicaid Other

## 2021-08-01 ENCOUNTER — Inpatient Hospital Stay (HOSPITAL_BASED_OUTPATIENT_CLINIC_OR_DEPARTMENT_OTHER): Payer: Medicaid Other | Admitting: Physician Assistant

## 2021-08-01 VITALS — Ht 64.0 in | Wt 204.7 lb

## 2021-08-01 VITALS — BP 133/84 | HR 86 | Temp 98.2°F | Resp 18

## 2021-08-01 DIAGNOSIS — C50411 Malignant neoplasm of upper-outer quadrant of right female breast: Secondary | ICD-10-CM

## 2021-08-01 DIAGNOSIS — Z170 Estrogen receptor positive status [ER+]: Secondary | ICD-10-CM

## 2021-08-01 DIAGNOSIS — L0291 Cutaneous abscess, unspecified: Secondary | ICD-10-CM | POA: Diagnosis not present

## 2021-08-01 DIAGNOSIS — R600 Localized edema: Secondary | ICD-10-CM

## 2021-08-01 DIAGNOSIS — R739 Hyperglycemia, unspecified: Secondary | ICD-10-CM | POA: Diagnosis not present

## 2021-08-01 LAB — CMP (CANCER CENTER ONLY)
ALT: 39 U/L (ref 0–44)
AST: 30 U/L (ref 15–41)
Albumin: 4.7 g/dL (ref 3.5–5.0)
Alkaline Phosphatase: 94 U/L (ref 38–126)
Anion gap: 13 (ref 5–15)
BUN: 13 mg/dL (ref 6–20)
CO2: 23 mmol/L (ref 22–32)
Calcium: 9.9 mg/dL (ref 8.9–10.3)
Chloride: 102 mmol/L (ref 98–111)
Creatinine: 1.01 mg/dL — ABNORMAL HIGH (ref 0.44–1.00)
GFR, Estimated: >60 mL/min (ref >60)
Glucose, Bld: 400 mg/dL — ABNORMAL HIGH (ref 70–99)
Potassium: 4 mmol/L (ref 3.5–5.1)
Sodium: 138 mmol/L (ref 135–145)
Total Bilirubin: 0.4 mg/dL (ref 0.3–1.2)
Total Protein: 8.7 g/dL — ABNORMAL HIGH (ref 6.5–8.1)

## 2021-08-01 LAB — CBC WITH DIFFERENTIAL (CANCER CENTER ONLY)
Abs Immature Granulocytes: 0.02 10*3/uL (ref 0.00–0.07)
Basophils Absolute: 0.1 10*3/uL (ref 0.0–0.1)
Basophils Relative: 1 %
Eosinophils Absolute: 0.2 10*3/uL (ref 0.0–0.5)
Eosinophils Relative: 3 %
HCT: 43.6 % (ref 36.0–46.0)
Hemoglobin: 14.7 g/dL (ref 12.0–15.0)
Immature Granulocytes: 0 %
Lymphocytes Relative: 16 %
Lymphs Abs: 1.2 10*3/uL (ref 0.7–4.0)
MCH: 29.3 pg (ref 26.0–34.0)
MCHC: 33.7 g/dL (ref 30.0–36.0)
MCV: 87 fL (ref 80.0–100.0)
Monocytes Absolute: 0.4 10*3/uL (ref 0.1–1.0)
Monocytes Relative: 6 %
Neutro Abs: 5.4 10*3/uL (ref 1.7–7.7)
Neutrophils Relative %: 74 %
Platelet Count: 244 10*3/uL (ref 150–400)
RBC: 5.01 MIL/uL (ref 3.87–5.11)
RDW: 12.4 % (ref 11.5–15.5)
WBC Count: 7.3 10*3/uL (ref 4.0–10.5)
nRBC: 0 % (ref 0.0–0.2)

## 2021-08-01 LAB — MAGNESIUM: Magnesium: 1.8 mg/dL (ref 1.7–2.4)

## 2021-08-01 MED ORDER — GOSERELIN ACETATE 3.6 MG ~~LOC~~ IMPL
3.6000 mg | DRUG_IMPLANT | Freq: Once | SUBCUTANEOUS | Status: AC
  Administered 2021-08-01: 3.6 mg via SUBCUTANEOUS
  Filled 2021-08-01: qty 3.6

## 2021-08-01 MED ORDER — DOXYCYCLINE HYCLATE 100 MG PO TABS
100.0000 mg | ORAL_TABLET | Freq: Two times a day (BID) | ORAL | 0 refills | Status: AC
  Filled 2021-08-01: qty 14, 7d supply, fill #0

## 2021-08-01 MED ORDER — INSULIN ASPART 100 UNIT/ML IJ SOLN
10.0000 [IU] | Freq: Once | INTRAMUSCULAR | Status: AC
  Administered 2021-08-01: 10 [IU] via SUBCUTANEOUS
  Filled 2021-08-01: qty 1

## 2021-08-01 NOTE — Progress Notes (Signed)
Pt was given 10 units subq of Novolog for 400 blood sugar. Pt tolerated well. Scheduled pt for [place] of Left lower extremity at [facility] main at 1pm. Pt made aware while in office. AVS was printed and pt ambulated out of clinic.

## 2021-08-01 NOTE — Patient Instructions (Signed)
Goserelin injection °What is this medication? °GOSERELIN (GOE se rel in) is similar to a hormone found in the body. It lowers the amount of sex hormones that the body makes. Men will have lower testosterone levels and women will have lower estrogen levels while taking this medicine. In men, this medicine is used to treat prostate cancer; the injection is either given once per month or once every 12 weeks. A once per month injection (only) is used to treat women with endometriosis, dysfunctional uterine bleeding, or advanced breast cancer. °This medicine may be used for other purposes; ask your health care provider or pharmacist if you have questions. °COMMON BRAND NAME(S): Zoladex, Zoladex 3-Month °What should I tell my care team before I take this medication? °They need to know if you have any of these conditions: °bone problems °diabetes °heart disease °history of irregular heartbeat °an unusual or allergic reaction to goserelin, other medicines, foods, dyes, or preservatives °pregnant or trying to get pregnant °breast-feeding °How should I use this medication? °This medicine is for injection under the skin. It is given by a health care professional in a hospital or clinic setting. °Talk to your pediatrician regarding the use of this medicine in children. Special care may be needed. °Overdosage: If you think you have taken too much of this medicine contact a poison control center or emergency room at once. °NOTE: This medicine is only for you. Do not share this medicine with others. °What if I miss a dose? °It is important not to miss your dose. Call your doctor or health care professional if you are unable to keep an appointment. °What may interact with this medication? °Do not take this medicine with any of the following medications: °cisapride °dronedarone °pimozide °thioridazine °This medicine may also interact with the following medications: °other medicines that prolong the QT interval (an abnormal heart  rhythm) °This list may not describe all possible interactions. Give your health care provider a list of all the medicines, herbs, non-prescription drugs, or dietary supplements you use. Also tell them if you smoke, drink alcohol, or use illegal drugs. Some items may interact with your medicine. °What should I watch for while using this medication? °Visit your doctor or health care provider for regular checks on your progress. Your symptoms may appear to get worse during the first weeks of this therapy. Tell your doctor or healthcare provider if your symptoms do not start to get better or if they get worse after this time. °Your bones may get weaker if you take this medicine for a long time. If you smoke or frequently drink alcohol you may increase your risk of bone loss. A family history of osteoporosis, chronic use of drugs for seizures (convulsions), or corticosteroids can also increase your risk of bone loss. Talk to your doctor about how to keep your bones strong. °This medicine should stop regular monthly menstruation in women. Tell your doctor if you continue to menstruate. °Women should not become pregnant while taking this medicine or for 12 weeks after stopping this medicine. Women should inform their doctor if they wish to become pregnant or think they might be pregnant. There is a potential for serious side effects to an unborn child. Talk to your health care professional or pharmacist for more information. Do not breast-feed an infant while taking this medicine. °Men should inform their doctors if they wish to father a child. This medicine may lower sperm counts. Talk to your health care professional or pharmacist for more information. °This   medicine may increase blood sugar. Ask your healthcare provider if changes in diet or medicines are needed if you have diabetes. °What side effects may I notice from receiving this medication? °Side effects that you should report to your doctor or health care  professional as soon as possible: °allergic reactions like skin rash, itching or hives, swelling of the face, lips, or tongue °bone pain °breathing problems °changes in vision °chest pain °feeling faint or lightheaded, falls °fever, chills °pain, swelling, warmth in the leg °pain, tingling, numbness in the hands or feet °signs and symptoms of high blood sugar such as being more thirsty or hungry or having to urinate more than normal. You may also feel very tired or have blurry vision °signs and symptoms of low blood pressure like dizziness; feeling faint or lightheaded, falls; unusually weak or tired °stomach pain °swelling of the ankles, feet, hands °trouble passing urine or change in the amount of urine °unusually high or low blood pressure °unusually weak or tired °Side effects that usually do not require medical attention (report to your doctor or health care professional if they continue or are bothersome): °change in sex drive or performance °changes in breast size in both males and females °changes in emotions or moods °headache °hot flashes °irritation at site where injected °loss of appetite °skin problems like acne, dry skin °vaginal dryness °This list may not describe all possible side effects. Call your doctor for medical advice about side effects. You may report side effects to FDA at 1-800-FDA-1088. °Where should I keep my medication? °This drug is given in a hospital or clinic and will not be stored at home. °NOTE: This sheet is a summary. It may not cover all possible information. If you have questions about this medicine, talk to your doctor, pharmacist, or health care provider. °© 2022 [employer] (2019-01-03 00:00:00) ° °

## 2021-08-01 NOTE — Progress Notes (Signed)
She reports diarrhea x5 in last 24 hours. She has some calf tightness which is making it difficult for her to walk. No redness or swelling noted. Dr. Geralyn Flash nurse and [place] PA notified. Pt to be seen in [facility]

## 2021-08-01 NOTE — Progress Notes (Signed)
Pt presented in clinic today for Zoladex injection.  Pt complaint of diarrhea x5 episodes today as well as bilateral lower extremity calf cramping and pain.  Per MD pt needing to be evaluated in [facility].  Appt scheduled.

## 2021-08-01 NOTE — Patient Instructions (Addendum)
STOP taking letrozole until you see Dr. Lindi Adie for follow up. Do not throw it away, just hold medication for now.  Ultrasound of your leg will be scheduled and you will be notified when that is. We will call you with the results.  It is important to increase your water intake and not drink sugary drinks. You can flavor water with sugar free packets from the grocery store.    Primary care doctor: I called [facility] located at  [place] [address], [place] [address]. The soonest appointment they have is: December 28 8:50 AM with Archie Patten. Office phone number is : [phone].  -I accepted that appointment for you and they added you to the cancellations list so if an appointment becomes available sooner they will call you.    GYN: I called GYN Dr. Marjory Lies office and they will call me back with an appointment day and time. I will call to tell you when that is.

## 2021-08-01 NOTE — Progress Notes (Signed)
Symptom Management Consult note [place]    Patient Care Team: Patient, [name] as PCP - General (General Practice) Rockwell Germany, RN as Oncology Nurse Navigator Mauro Kaufmann, RN as Oncology Nurse Navigator [name], MD as Consulting Physician (General Surgery) Nicholas Lose, MD as Consulting Physician (Hematology and Oncology) Gery Pray, MD as Consulting Physician (Radiation Oncology)    Name of the patient: Melody Rodriguez  [id number]  12-10-1975   Date of visit: 08/01/2021    Chief complaint/ Reason for visit- diarrhea and leg pain  Oncology History  Malignant neoplasm of upper-outer quadrant of right breast in female, estrogen receptor positive ([place])  07/28/2020 Initial Diagnosis   Patient palpated a right breast mass for 1-2 years. Mammogram showed a 2.2cm mass at the 11 o'clock position with surrounding calcifications, 6.4cm in total extent, and up to 5 abnormal right axillary lymph nodes. 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    08/05/2020 Miscellaneous   MammaPrint: High risk luminal type B   08/11/2020 Genetic Testing   Negative genetic testing: no pathogenic variants detected in [facility] Breast Cancer STAT Panel or Common Hereditary Cancers panel. The report dates are August 11, 2020 and August 19, 2020, respectively. Two variants of uncertain signficance were detected - one in the CTNNA1 gene called c.86del and the second in the MLH1 gene called c.808A>G.   UPDATE:  The MLH1 c.808A>G VUS was reclassified to "Likely Benign" on 02/19/2021. The change in variant classification was made as a result of re-review of the evidence in light of new variant interpretation guidelines and/or new information.   The STAT Breast cancer panel offered by [facility] includes sequencing and rearrangement  analysis for the following 9 genes:  ATM, BRCA1, BRCA2, CDH1, CHEK2, PALB2, PTEN, STK11 and TP53.  The Common Hereditary Cancers Panel offered by [facility] includes sequencing and/or deletion duplication testing of the following 48 genes: APC, ATM, AXIN2, BARD1, BMPR1A, BRCA1, BRCA2, BRIP1, CDH1, CDK4, CDKN2A (p14ARF), CDKN2A (p16INK4a), CHEK2, CTNNA1, DICER1, EPCAM (Deletion/duplication testing only), GREM1 (promoter region deletion/duplication testing only), KIT, MEN1, MLH1, MSH2, MSH3, MSH6, MUTYH, NBN, NF1, NTHL1, PALB2, PDGFRA, PMS2, POLD1, POLE, PTEN, RAD50, RAD51C, RAD51D, RNF43, SDHB, SDHC, SDHD, SMAD4, SMARCA4. STK11, TP53, TSC1, TSC2, and VHL.  The following genes were evaluated for sequence changes only: SDHA and HOXB13 c.251G>A variant only.    09/01/2020 - 01/11/2021 Chemotherapy   Patient is on Treatment Plan : BREAST Dose Dense AC q14d / PACLitaxel D1,8,15 q21d (Carbo removed by Dr. Lindi Adie)     02/07/2021 Surgery   Right lumpectomy [name]): invasive and in situ ductal carcinoma, 2.8cm, clear margins, with metastatic carcinoma in 1/1 right axillary lymph nodes.     Current Therapy: started Letrozole x 2 weeks ago  Interval history- Melody Rodriguez is a 45 yo female with above mentioned history presenting to [facility] today with chief complaint of diarrhea x 1 day and leg cramping x 2 days. Patient states she has had 5 episodes of non bloody diarrhea in the last 24 hours. She admits to getting diarrhea weekly. She usually take imodium or eats a bland diet when it happens, she has not tried that yet for this current episode. She denies any associated abdominal pain. Patient reports leg cramping in both legs. Cramping is constant, rates pain 6/10 in severity. No history  of injury or trauma. She also admits frequent severe hot flashes since starting letrozole as well. Patient is also reporting intermittent blurry vision, polyuria, and polydipsia x 5 months. She admits durching chemo her blood sugar was  high and pcp started her on metformin. She had severe diarrhea and chose to stop taking medication. She states her blood sugars have been "pretty normal" since finishing chemo. She never followed back up with pcp. No recent UTI or dysuria, no vaginal discharge. Patient admits to boil in gron area with active drainage x 1 week. Has had these before and see GYN for them. She admits to purulent drainage and swelling. No OTC medications for symptoms prior to arrival.      Review of Systems  Constitutional:  Negative for chills and fever.  HENT:  Negative for congestion and ear pain.   Eyes:  Positive for blurred vision.  Respiratory:  Negative for cough, shortness of breath and wheezing.   Cardiovascular:  Negative for chest pain, palpitations, orthopnea and leg swelling.  Gastrointestinal:  Positive for diarrhea. Negative for abdominal pain, blood in stool, constipation, melena, nausea and vomiting.  Genitourinary:  Positive for frequency. Negative for dysuria, flank pain, hematuria and urgency.  Musculoskeletal:  Positive for myalgias.  Skin:  Negative for rash.  Neurological:  Negative for dizziness, weakness and headaches.  Endo/Heme/Allergies:  Positive for polydipsia.  Psychiatric/Behavioral:  The patient is not nervous/anxious.       Allergies  Allergen Reactions   Cephalexin Hives and Rash   Latex Swelling    Itching, swelling   Naproxen Itching     Past Medical History:  Diagnosis Date   Breast cancer ([place])    Family history of colon cancer    History of radiation therapy    right breast/scv  05/10/21-07/01/21  Dr Gery Pray     Past Surgical History:  Procedure Laterality Date   BREAST BIOPSY Right 02/18/2021   Procedure: EVACUATION HEMATOMA RIGHT AXILLA;  Surgeon: [name], MD;  Location: [place];  Service: General;  Laterality: Right;   BREAST CYST EXCISION Right    Patient does not recall (2014 or 2015)   BREAST LUMPECTOMY WITH  RADIOACTIVE SEED AND SENTINEL LYMPH NODE BIOPSY Right 02/07/2021   Procedure: RIGHT BREAST LUMPECTOMY WITH RADIOACTIVE SEED AND SEED TARGETED LYMPH NODE BIOPSY AND SENTINEL LYMPH NODE BIOPSY;  Surgeon: [name], MD;  Location: [place];  Service: General;  Laterality: Right;   CESAREAN SECTION     IR IMAGING GUIDED PORT INSERTION  09/15/2020   IR REMOVAL TUN CV CATH W/O FL  09/15/2020   NODE DISSECTION Right 03/28/2021   Procedure: RIGHT AXILLARY LYMPH NODE DISSECTION;  Surgeon: [name], MD;  Location: [place];  Service: General;  Laterality: Right;   PORT-A-CATH REMOVAL Left 02/07/2021   Procedure: REMOVAL PORT-A-CATH;  Surgeon: [name], MD;  Location: [place];  Service: General;  Laterality: Left;   THYROIDECTOMY, PARTIAL      Social History   Socioeconomic History   Marital status: Single    Spouse name: Not on file   Number of children: 2   Years of education: Not on file   Highest education level: Some college, no degree  Occupational History   Not on file  Tobacco Use   Smoking status: Former    Packs/day: 1.00    Types: Cigars, Cigarettes   Smokeless tobacco: Never   Tobacco comments:    quit 07/2020  Vaping Use   Vaping Use: Never used  Substance and Sexual Activity   Alcohol use: Yes    Comment: occas   Drug use: Yes    Types: Marijuana    Comment: chemo/ pain mgmt   Sexual activity: Not Currently  Other Topics Concern   Not on file  Social History Narrative   Not on file   Social Determinants of Health   Financial Resource Strain: High Risk   Difficulty of Paying Living Expenses: Hard  Food Insecurity: No Food Insecurity   Worried About Charity fundraiser in the Last Year: Never true   Ran Out of Food in the Last Year: Never true  Transportation Needs: Not on file  Physical Activity: Not on file  Stress: Not on file  Social Connections: Not on file  Intimate Partner  Violence: Not on file    Family History  Problem Relation Age of Onset   Hypertension Mother    Colon cancer Sister 60   Diabetes Maternal Grandmother    Cancer Maternal Aunt        unknown type dx late 84s     Current Outpatient Medications:    acetaminophen (TYLENOL) 325 MG tablet, Take by mouth., Disp: , Rfl:    cyclobenzaprine (FLEXERIL) 5 MG tablet, TAKE 1 TABLET BY MOUTH 3 TIMES DAILY AS NEEDED FOR MUSCLE SPASMS., Disp: 20 tablet, Rfl: 0   doxycycline (VIBRA-TABS) 100 MG tablet, Take 1 tablet (100 mg total) by mouth 2 (two) times daily for 7 days., Disp: 14 tablet, Rfl: 0   fexofenadine (ALLEGRA) 180 MG tablet, Take by mouth., Disp: , Rfl:    fluticasone (FLONASE) 50 MCG/ACT nasal spray, 2 sprays by Each Nare route daily for 30 days., Disp: , Rfl:    letrozole (FEMARA) 2.5 MG tablet, Take 1 tablet (2.5 mg total) by mouth daily., Disp: 90 tablet, Rfl: 3   loratadine (CLARITIN) 10 MG tablet, Take 10 mg by mouth daily., Disp: , Rfl:    oxyCODONE (OXY IR/ROXICODONE) 5 MG immediate release tablet, Take 1-2 tablets (5-10 mg total) by mouth every 6 (six) hours as needed for moderate pain, severe pain or breakthrough pain., Disp: 30 tablet, Rfl: 0   venlafaxine XR (EFFEXOR-XR) 37.5 MG 24 hr capsule, TAKE 1 CAPSULE BY MOUTH DAILY WITH BREAKFAST, Disp: 30 capsule, Rfl: 6  PHYSICAL EXAM: ECOG FS:1 - Symptomatic but completely ambulatory    Vitals:   08/01/21 1014  Weight: 204 lb 11.2 oz (92.9 kg)  Height: 5' 4" (1.626 m)  T: 98.2  BP: 133/84/   HR: 86   Resp: 18   O2: 99% room air  Physical Exam Vitals and nursing note reviewed.  Constitutional:      Appearance: She is well-developed. She is not ill-appearing or toxic-appearing.  HENT:     Head: Normocephalic and atraumatic.     Right Ear: External ear normal.     Left Ear: External ear normal.     Nose: Nose normal.  Eyes:     General: No scleral icterus.       Right eye: No discharge.        Left eye: No discharge.      Conjunctiva/sclera: Conjunctivae normal.  Neck:     Vascular: No JVD.  Cardiovascular:     Rate and Rhythm: Normal rate and regular rhythm.     Pulses: Normal pulses.          Dorsalis pedis pulses are 2+ on the right side  and 2+ on the left side.     Heart sounds: Normal heart sounds.  Pulmonary:     Effort: Pulmonary effort is normal.     Breath sounds: Normal breath sounds.  Chest:     Chest wall: No tenderness.  Abdominal:     General: There is no distension.     Palpations: Abdomen is soft. There is no mass.     Tenderness: There is no abdominal tenderness. There is no guarding or rebound.  Genitourinary:    Comments: 2 x 1 cm area of swelling to left labia with scab present. No palpable fluctuance. Not warm to the touch Musculoskeletal:        General: Normal range of motion.     Cervical back: Normal range of motion.     Right lower leg: No edema.     Left lower leg: Edema present.     Comments: Left calf tenderness. No palpable cords. No overlying skin changes to lower extremities.  Skin:    General: Skin is warm and dry.     Capillary Refill: Capillary refill takes less than 2 seconds.     Findings: No rash.     Comments: Equal tactile temperature in all extremities  Neurological:     Mental Status: She is oriented to person, place, and time.     GCS: GCS eye subscore is 4. GCS verbal subscore is 5. GCS motor subscore is 6.     Comments: Fluent speech, no facial droop.  Psychiatric:        Behavior: Behavior normal.       LABORATORY DATA: I have reviewed the data as listed CBC Latest Ref Rng & Units 08/01/2021 01/11/2021 01/04/2021  WBC 4.0 - 10.5 K/uL 7.3 5.4 5.4  Hemoglobin 12.0 - 15.0 g/dL 14.7 12.6 11.6(L)  Hematocrit 36.0 - 46.0 % 43.6 36.8 34.0(L)  Platelets 150 - 400 K/uL 244 239 214     CMP Latest Ref Rng & Units 08/01/2021 02/03/2021 01/11/2021  Glucose 70 - 99 mg/dL 400(H) 197(H) 211(H)  BUN 6 - 20 mg/dL _0 Creatinine 0.44 - 1.00 mg/dL  1.01(H) 0.56 0.69  Sodium 135 - 145 mmol/L 138 135 140  Potassium 3.5 - 5.1 mmol/L 4.0 4.2 4.0  Chloride 98 - 111 mmol/L 102 101 108  CO2 22 - 32 mmol/L 23 22 21(L)  Calcium 8.9 - 10.3 mg/dL 9.9 9.7 9.0  Total Protein 6.5 - 8.1 g/dL 8.7(H) - 7.3  Total Bilirubin 0.3 - 1.2 mg/dL 0.4 - 0.2(L)  Alkaline Phos 38 - 126 U/L 94 - 56  AST 15 - 41 U/L 30 - 22  ALT 0 - 44 U/L 39 - 29       RADIOGRAPHIC STUDIES: I have personally reviewed the radiological images as listed and agreed with the findings in the report. No images are attached to the encounter. XR Shoulder Right  Result Date: 07/27/2021 3 views of the right shoulder show no acute findings.    ASSESSMENT & PLAN: Patient is a 45 y.o. female with history of malignant neoplasm of upper-outer quadrant of right breast in female, estrogen receptor positive s/p right lumpectomy and radiation currently taking letrozole followed by oncologist Dr. Lindi Adie.  #) Calf tightness- recently started letrozole, could be side effect. Exam with mild swelling of LLE, no pitting edema. Has calf tenderness without palpable cords. Strong DP pulses.  Will send for DVT study of LLE.  #Diarrhea-  Labs show no electrolyte derangement. She  has diarrhea weekly usually controlled with imodium.benign abdominal exam. She has not yet tried OTC treatment this time, so recommending she do that now. Recommend she continue to monitor symptoms at home.  #) Hyperglycemia- glucose today is 400 on CMP. Kidney function up from baseline, no anion gap and no electrolyte derangements. Patient does not meet criteria for DKA with hyperglycemia only. She is reporting symptoms of diabetes and now has labs consistent with that. Patient given 10 units SQ insulin. She prefers to drink fluids by mouth and not have IVF today. Encouraged increased fluid intake to help with elevated creatinine. Patient is adamant about not seeing previous PCP again. I was able to make an appointment with [facility] for next month for patient to establish care and discuss management of diabetes. Had lengthy discussion about DKA and strict ED precautions.  #)Abscess- consistent with bartholin cyst. Already draining and no palpable fluctuance on exam.I called patient's GYN office to schedule appointment, waiting on call back. In the meantime will put on doxycyline as she is high risk for infection with new diabetes. Discussed symptomatic care as well including warm compress and sitz baths.  #)Breast cancer- Discussed treatment with oncologist Dr. Lindi Adie. He recommends holding letrozole x 2 weeks and following up with him for recheck as it could be causing her bilateral leg cramping. Patient agreeable with plan.   Visit Diagnosis: 1. Hyperglycemia   2. Edema of left lower extremity   3. Abscess      No orders of the defined types were placed in this encounter.   All questions were answered. The patient knows to call the clinic with any problems, questions or concerns. No barriers to learning was detected.  I have spent a total of 30 minutes minutes of face-to-face and non-face-to-face time, preparing to see the patient, obtaining and/or reviewing separately obtained history, performing a medically appropriate examination, counseling and educating the patient, ordering tests,  documenting clinical information in the electronic health record, and care coordination.     Thank you for allowing me to participate in the care of this patient.    Barrie Folk, PA-C Department of Hematology/Oncology [facility] Phone: [phone]  [phone]    08/01/2021 1:18 PM

## 2021-08-02 ENCOUNTER — Ambulatory Visit: Payer: Medicaid Other

## 2021-08-02 ENCOUNTER — Telehealth (HOSPITAL_BASED_OUTPATIENT_CLINIC_OR_DEPARTMENT_OTHER): Payer: Self-pay | Admitting: Physician Assistant

## 2021-08-02 DIAGNOSIS — Z483 Aftercare following surgery for neoplasm: Secondary | ICD-10-CM

## 2021-08-02 DIAGNOSIS — I890 Lymphedema, not elsewhere classified: Secondary | ICD-10-CM

## 2021-08-02 DIAGNOSIS — R293 Abnormal posture: Secondary | ICD-10-CM

## 2021-08-02 DIAGNOSIS — M25611 Stiffness of right shoulder, not elsewhere classified: Secondary | ICD-10-CM

## 2021-08-02 DIAGNOSIS — C50411 Malignant neoplasm of upper-outer quadrant of right female breast: Secondary | ICD-10-CM

## 2021-08-02 DIAGNOSIS — L599 Disorder of the skin and subcutaneous tissue related to radiation, unspecified: Secondary | ICD-10-CM

## 2021-08-02 DIAGNOSIS — M25511 Pain in right shoulder: Secondary | ICD-10-CM

## 2021-08-02 DIAGNOSIS — Z170 Estrogen receptor positive status [ER+]: Secondary | ICD-10-CM

## 2021-08-02 NOTE — Telephone Encounter (Signed)
Patient seen yesterday in [facility] with multiple complaints including leg pain and abscess.  Patient gave me permission to leave her a voicemail today with the results of the ultrasound.  I called patient and she did not answer so message was left.  I informed her that DVT study was negative.  I was also able to get a GYN appointment for her December 2 at 9:15 AM with Dr. Dione Plover at [facility] for [facility] health care at [facility] for women.  Patient typically sees Dr. Rip Harbour however he did not have any appointments until late December. Patient did voice to me yesterday that she was interested in seeing another provider in the practice anyway.

## 2021-08-02 NOTE — Therapy (Signed)
[facility] @ [facility] [place], [place], [address] Phone: [phone]   Fax:  [phone]  Physical Therapy Treatment  Patient Details  Name: Melody Rodriguez MRN: [id number] Date of Birth: 08/21/1976 Referring Provider (PT): Dr. [name]   Encounter Date: 08/02/2021   PT End of Session - 08/02/21 0956     Visit Number 16    Number of Visits 20    Date for PT Re-Evaluation 08/10/21    Authorization Type [employer] - needs auth after 20 visits    Authorization - Visit Number 16    Authorization - Number of Visits 20    PT Start Time 0906    PT Stop Time 1000    PT Time Calculation (min) 54 min    Activity Tolerance Patient tolerated treatment well    Behavior During Therapy [facility] for tasks assessed/performed             Past Medical History:  Diagnosis Date   Breast cancer [facility])    Family history of colon cancer    History of radiation therapy    right breast/scv  05/10/21-07/01/21  Dr Gery Pray    Past Surgical History:  Procedure Laterality Date   BREAST BIOPSY Right 02/18/2021   Procedure: EVACUATION HEMATOMA RIGHT AXILLA;  Surgeon: [name], MD;  Location: [facility];  Service: General;  Laterality: Right;   BREAST CYST EXCISION Right    Patient does not recall (2014 or 2015)   BREAST LUMPECTOMY WITH RADIOACTIVE SEED AND SENTINEL LYMPH NODE BIOPSY Right 02/07/2021   Procedure: RIGHT BREAST LUMPECTOMY WITH RADIOACTIVE SEED AND SEED TARGETED LYMPH NODE BIOPSY AND SENTINEL LYMPH NODE BIOPSY;  Surgeon: [name], MD;  Location: [place];  Service: General;  Laterality: Right;   CESAREAN SECTION     IR IMAGING GUIDED PORT INSERTION  09/15/2020   IR REMOVAL TUN CV CATH W/O FL  09/15/2020   NODE DISSECTION Right 03/28/2021   Procedure: RIGHT AXILLARY LYMPH NODE DISSECTION;  Surgeon: [name], MD;  Location: [place];   Service: General;  Laterality: Right;   PORT-A-CATH REMOVAL Left 02/07/2021   Procedure: REMOVAL PORT-A-CATH;  Surgeon: [name], MD;  Location: [place];  Service: General;  Laterality: Left;   THYROIDECTOMY, PARTIAL      There were no vitals filed for this visit.   Subjective Assessment - 08/02/21 0907     Subjective I can tell my Rt shoulder is feeling better since I got that shot. They called me and Dr. Adela Ports office called and said they actually want me to come back tomorrow for another cortisone shot at the front of my shoulder this time so I hope it keeps getting better. I have been able to move it a little better over the past week but I do still have to sleep with my arm on a pillow. I saw my doctor yesterday and they did blood work and my sugar was 400 so she gave me a insulin shot and said I'm diabetic. I know it can get that down though with exercise and better eating so I'm more motivated now to start getting better with those things.    Pertinent History Patient was diagnosed on 07/13/2020 with right grade 2 invasive ductal carcinoma breast cancer. It is ER/PR positive and HER2 negative with a Ki67 of 20%. Patient underwent neoadjuvant chemotherapy from 09/01/2020 - 01/14/2021. She had a right lumpectomy and sentinel  node biopsy (1/1 nodes positive) on 02/07/2021. Hematoma evacuation on 02/18/2021. Axillary dissection on 03/28/2021 with 3/7 positive nodes. Iron deficiency. Radiation starts 05/10/2021    Patient Stated Goals Get my arm better    Currently in Pain? Yes    Pain Score 5     Pain Location Shoulder    Pain Orientation Right    Pain Descriptors / Indicators Throbbing    Pain Type Surgical pain    Pain Onset More than a month ago    Pain Frequency Constant    Aggravating Factors  laying down still hurts    Pain Relieving Factors the cortisone shot did start to help after a day or so                               [facility] PT  Treatment/Exercise - 08/02/21 0001       Shoulder Exercises: Pulleys   Flexion 2 minutes    ABduction 2 minutes    ABduction Limitations Pt with much improved technqiue today and able to relax shoulder better as well      Shoulder Exercises: Therapy Ball   Flexion Both;10 reps   brief tactile cues to decrease Rt scapular compensation     Shoulder Exercises: Isometric Strengthening   Flexion 3X5"   in doorway with Rt UE for each isometric ex   Extension 3X5"   in doorway with Rt UE   External Rotation 3X5"   in doorway with Rt UE   External Rotation Limitations VCs not to push into pain    ABduction 3X5"   in doorway with Rt UE   ABduction Limitations VCs to decrease shoulder hike      Shoulder Exercises: Stretch   Other Shoulder Stretches Childs Pose 5x slowly, pt was able to stretch a little further with each rep and reports very mild pain with these      Manual Therapy   Soft tissue mobilization In Supine with cocoa butter to Rt shoulder but focused mostly as anterior aspect where most tightness palpable    Myofascial Release To Rt axilla, cording palpable here but improved today    Passive ROM In Supine to Rt shoulder into flexion and abduction to pts tolerance                     PT Education - 08/02/21 0957     Education Details Isometrics for Rt shoulder in doorway    Person(s) Educated Patient    Methods Explanation;Demonstration;Handout    Comprehension Verbalized understanding;Returned demonstration;Need further instruction;Verbal cues required                 PT Long Term Goals - 07/18/21 1610       PT LONG TERM GOAL #1   Title Patient will demonstrate she has regained full shoulder ROM and function post operatively compared to baselines.    Time 4    Period Weeks    Status On-going    Target Date 08/10/21      PT LONG TERM GOAL #2   Title Patient will increase right shoulder active flexion to >/= 120 degrees for inceased ease reaching  overhead.    Time 4    Status Achieved      PT LONG TERM GOAL #3   Title Patient will increase her right shoulder active abduction to >/= 130 degrees to obtain radiation positioning.    Baseline  130    Time 4    Period Weeks    Status Achieved    Target Date 07/18/21      PT LONG TERM GOAL #4   Title Patient will improve her DASH score to </= 53 for improved arm function.    Time 4    Period Weeks    Status Achieved      PT LONG TERM GOAL #5   Title Patient will report she has a good understanding of lymphedema risk reduction practices.    Time 4    Period Weeks    Status Achieved      PT LONG TERM GOAL #6   Title Pt will be independent in a home exercise program for continued strengthening and stretching.    Time 4    Period Weeks      PT LONG TERM GOAL #7   Title Pt will report a 50% improvement in pain to allow her to clean the bathroom and do ADLs with improved comfort.    Time 4    Period Weeks    Status On-going      PT LONG TERM GOAL #8   Title Pt will be independent in self MLD for long term management of edema.    Time 4    Period Weeks    Status On-going    Target Date 08/10/21      PT LONG TERM GOAL  #9   TITLE Pt will be able to reach up in to the cabinet with her right arm to put up dishes to allow her to return to prior level of function.    Baseline uses L UE; pt able to use Rt arm about 50% of time with this now 07/18/2021    Time 4    Period Weeks    Status On-going    Target Date 08/10/21                   Plan - 08/02/21 0959     Clinical Impression Statement Pts pain was some reduced today than it has been in awhile. She tolerated pulleys today with very mild to no compensations and tolerated gentle progression of strengthening exercises with isometrics with mild to no increase pain. Encouraged her to NOT push into pain with these. She will benefit from review to reassess techique. Then focused on maual therapy to help decrease  muscluar tightness surrounding shoulder but more so at anterior aspect. Her P/ROM was improved by end of session. Pt sees Dr. [name] again tomorrow for a second cortisone shot.    Personal Factors and Comorbidities Comorbidity 3+    Comorbidities muliple surgeries, chemotherapy    Examination-Activity Limitations Reach Overhead;Lift;Carry    Examination-Participation Restrictions Occupation    Stability/Clinical Decision Making Stable/Uncomplicated    Rehab Potential Good    PT Frequency 2x / week    PT Duration 4 weeks    PT Treatment/Interventions Therapeutic exercise;Patient/family education;Manual techniques;Manual lymph drainage;Scar mobilization;Passive range of motion;ADLs/Self Care Home Management;Orthotic Fit/Training;Vasopneumatic Device;Taping    PT Next Visit Plan Has isRt shoulder after second cortisone shot? Cont PROM of Rt shoulder and manual therapy to Rt upper quadrant; also cont MLD to Rt breast prn and instruct pt in this, issuing handout; AAROM exercises with pulleys and ball roll up wall, measure for compression sleeve? cont every 3 month L-Dex screens for up to 2 years from her SLNB    PT Home Exercise Plan Post op shoulder ROM HEP, supine  dowel exercises; isometrics for Rt shoulder    Consulted and Agree with Plan of Care Patient             Patient will benefit from skilled therapeutic intervention in order to improve the following deficits and impairments:  Postural dysfunction, Decreased range of motion, Pain, Impaired UE functional use, Decreased knowledge of precautions, Increased fascial restricitons, Decreased scar mobility, Increased edema, Decreased strength  Visit Diagnosis: Lymphedema, not elsewhere classified  Disorder of the skin and subcutaneous tissue related to radiation, unspecified  Aftercare following surgery for neoplasm  Stiffness of right shoulder, not elsewhere classified  Acute pain of right shoulder  Abnormal posture  Malignant  neoplasm of upper-outer quadrant of right breast in female, estrogen receptor positive ([place])     Problem List Patient Active Problem List   Diagnosis Date Noted   Bartholin cyst 05/06/2021   Amenorrhea, secondary 03/02/2021   PICC (peripherally inserted central catheter) in place 09/09/2020   Genetic testing 08/11/2020   Iron deficiency anemia due to chronic blood loss 08/04/2020   Family history of colon cancer    Malignant neoplasm of upper-outer quadrant of right breast in female, estrogen receptor positive ([place]) 07/28/2020    Otelia Limes, PTA 08/02/2021, 10:04 AM  [facility] @ [facility], [place], [address] Phone: [phone]   Fax:  [phone]  Name: Melody Rodriguez MRN: [id number] Date of Birth: 1976-02-18

## 2021-08-02 NOTE — Patient Instructions (Signed)

## 2021-08-03 ENCOUNTER — Other Ambulatory Visit: Payer: Self-pay

## 2021-08-03 ENCOUNTER — Other Ambulatory Visit: Payer: Self-pay | Admitting: Hematology and Oncology

## 2021-08-03 ENCOUNTER — Ambulatory Visit: Payer: Self-pay

## 2021-08-03 ENCOUNTER — Other Ambulatory Visit (HOSPITAL_COMMUNITY): Payer: Self-pay

## 2021-08-03 ENCOUNTER — Ambulatory Visit (INDEPENDENT_AMBULATORY_CARE_PROVIDER_SITE_OTHER): Payer: Medicaid Other | Admitting: Physical Medicine and Rehabilitation

## 2021-08-03 ENCOUNTER — Encounter: Payer: Self-pay | Admitting: Physical Medicine and Rehabilitation

## 2021-08-03 DIAGNOSIS — M25511 Pain in right shoulder: Secondary | ICD-10-CM | POA: Diagnosis not present

## 2021-08-03 DIAGNOSIS — G8929 Other chronic pain: Secondary | ICD-10-CM | POA: Diagnosis not present

## 2021-08-03 MED ORDER — CYCLOBENZAPRINE HCL 5 MG PO TABS
ORAL_TABLET | Freq: Three times a day (TID) | ORAL | 0 refills | Status: DC | PRN
  Filled 2021-08-03: qty 20, 7d supply, fill #0

## 2021-08-03 NOTE — Progress Notes (Signed)
Pt state right shoulder pain. Pt state any movement with her right arm makes the pain worse. Pt state she takes pain meds to help ease her pain.  Numeric Pain Rating Scale and Functional Assessment Average Pain 6   In the last MONTH (on 0-10 scale) has pain interfered with the following?  1. General activity like being  able to carry out your everyday physical activities such as walking, climbing stairs, carrying groceries, or moving a chair?  Rating(10)   -BT, -Dye Allergies.

## 2021-08-03 NOTE — Progress Notes (Signed)
Radiation Oncology         [phone] ________________________________  Name: Melody Rodriguez MRN: [id number]  Date: 08/04/2021  DOB: 19-Dec-1975  Follow-Up Visit Note  CC: Patient, [name]  Melody Lose, MD    ICD-10-CM   1. Malignant neoplasm of upper-outer quadrant of right breast in female, estrogen receptor positive ([place])  C50.411    Z17.0       Diagnosis: Stage IIA (cT2,cN1, cM0) Right Breast UOQ, Invasive Carcinoma with DCIS, ER+ / PR+ / Her2-, Grade 2  Interval Since Last Radiation: 1 month and 3 days   Intent: Curative  Radiation Treatment Dates: 05/10/2021 through 07/01/2021 Site Technique Total Dose (Gy) Dose per Fx (Gy) Completed Fx Beam Energies  Breast, Right: Breast_Rt 3D 50.4/50.4 1.8 28/28 10X  Breast, Right: Breast_Rt_Bst specialPort 12/12 2 6/6 15E  Sclav-RT: SCV_Rt 3D 50.4/50.4 1.8 28/28 6X, 10X    Narrative:  The patient returns today for routine follow-up. The patient tolerated radiation therapy relatively well. On 06/28/21, the patient reported pain to right breast rated at a 6/10, for which she used Tylenol and ibuprofen without relief. Patient also reported moderate fatigue, and persistent swelling and tenderness of the right breast. The patient ceased sonafine use because she understood that it was for itching and hydrocortisone improved it better. I encouraged the patient to use sonafine to protect her skin.    Physical exam performed during her final weekly treatment check revealed the right supraclavicular area to show dry desquamation but no active moist desquamation. The patient reported using neosporin to area of dry desquamation.  The breast area appeared erythematous and with hyperpigmentation changes and swelling, no skin breakdown noted. Patient displayed a full range of motion, however, she reported pain when lifting her arm.         Of note: the patient followed up with Dr. Lindi Rodriguez on 06/27/21. During which time, the patient again  reported severe radiation dermatitis especially to the bottom of her neck, as well as severe pain in her right shoulder (for which she used her narcotic medication on an on and off basis).          Following radiation therapy, the patient began treatment consisting of once monthly letrozole on 07/04/21. Per Dr. Lindi Rodriguez, the patient will start abemaciclib around January if she tolerates letrozole well.        On 08/01/21, the patient presented to Melody Dade PA-C on 08/01/21 with the chief complaint of leg pain and diarrhea. The patient reported 5 episodes of non-bloody diarrhea in the past 24 hours, and noted episodes of diarrhea occurring on a weekly basis. Patient defined leg cramping as constant and rated pain at a 6/10 in severity. Patient also reported intermittent blurry vision, polyuria, and polydipsia x 5 months, history of hyperglycemia (during chemotherapy), severe diarrhea, and a boil in the groin area with active drainage x 1 week. She stated that her blood sugar has been "pretty normal" since finishing chemo. Lastly, the patient reported frequent severe hot flashes since starting letrozole. Physical exam performed during this encounter revealed some mild LLE swelling as well. DVT study was accordingly ordered. CMP performed revealed the patient to be actively hyperglycemic, with glucose of 400. Accordingly, the patient was arranged to meet with  community health and wellness next month for establishment of care, and discuss management of diabetes (patient stated that she did not want to return back to her PCP who she saw for past hyperglycemia).   Lower venous  DVT study on 08/01/21 showed no evidence of LE DVT.  No imaging has been performed since consultation date pertinent to the patient's Dx.   The patient reports continuing pain in the right breast.  She is requiring her oxycodone and uses this primarily on the weekends.  She is hesitate to take this medication during the  week with her child in school and fear of oversleeping.  Patient continues to wear a lymphedema sleeve and reports will be getting a new one in the near future.  She also is undergoing physical therapy twice a week for her breast lymphedema and right arm lymphedema.  She also reports undergoing steroid injection of her right shoulder which has not been helpful for her.  She denies any nipple discharge or bleeding.  Allergies:  is allergic to cephalexin, latex, and naproxen.  Meds: Current Outpatient Medications  Medication Sig Dispense Refill   acetaminophen (TYLENOL) 325 MG tablet Take by mouth.     cyclobenzaprine (FLEXERIL) 5 MG tablet TAKE 1 TABLET BY MOUTH 3 TIMES DAILY AS NEEDED FOR MUSCLE SPASMS. 20 tablet 0   doxycycline (VIBRA-TABS) 100 MG tablet Take 1 tablet (100 mg total) by mouth 2 (two) times daily for 7 days. 14 tablet 0   fexofenadine (ALLEGRA) 180 MG tablet Take by mouth.     fluticasone (FLONASE) 50 MCG/ACT nasal spray 2 sprays by Each Nare route daily for 30 days.     letrozole (FEMARA) 2.5 MG tablet Take 1 tablet (2.5 mg total) by mouth daily. 90 tablet 3   loratadine (CLARITIN) 10 MG tablet Take 10 mg by mouth daily.     oxyCODONE (OXY IR/ROXICODONE) 5 MG immediate release tablet Take 1-2 tablets (5-10 mg total) by mouth every 6 (six) hours as needed for moderate pain, severe pain or breakthrough pain. 30 tablet 0   venlafaxine XR (EFFEXOR-XR) 37.5 MG 24 hr capsule TAKE 1 CAPSULE BY MOUTH DAILY WITH BREAKFAST 30 capsule 6   No current facility-administered medications for this encounter.    Physical Findings: The patient is in no acute distress. Patient is alert and oriented.  height is _0  (1.626 m) and weight is 206 lb 12.8 oz (93.8 kg). Her temperature is 97.9 F (36.6 C). Her blood pressure is 120/77 and her pulse is 98. Her respiration is 20 and oxygen saturation is 98%. .   Lungs are clear to auscultation bilaterally. Heart has regular rate and rhythm. No  palpable cervical, supraclavicular, or axillary adenopathy. Abdomen soft, non-tender, normal bowel sounds.  Left Breast: no palpable mass, nipple discharge or bleeding.  The right breast skin is healed well.  She continues to have some edema in the central aspect of the breast but overall the breast has softened up quite a bit since my last exam.  She continues to have hyperpigmentation changes throughout the breast and supraclavicular region.  No dominant mass appreciated in the breast nipple discharge or bleeding.    Lab Findings: Lab Results  Component Value Date   WBC 7.3 08/01/2021   HGB 14.7 08/01/2021   HCT 43.6 08/01/2021   MCV 87.0 08/01/2021   PLT 244 08/01/2021    Radiographic Findings: XR C-ARM NO REPORT  Result Date: 08/03/2021 Please see Notes tab for imaging impression.  XR Shoulder Right  Result Date: 07/27/2021 3 views of the right shoulder show no acute findings.  VAS [place] LOWER EXTREMITY VENOUS (DVT)  Result Date: 08/01/2021  Lower Venous DVT Study Patient Name:  LAQUETA BONAVENTURA  Date  of Exam:   08/01/2021 Medical Rec #: [id number]     Accession #:    [id number] Date of Birth: 1976-03-26     Patient Gender: F Patient Age:   66 years Exam Location:  [facility] Procedure:      VAS [place] LOWER EXTREMITY VENOUS (DVT) Referring Phys: Melody Rodriguez --------------------------------------------------------------------------------  Indications: Pain, and Swelling.  Risk Factors: CA patient on chemotherapy. Comparison Study: No previous exams Performing Technologist: Jody Hill RVT, RDMS  Examination Guidelines: A complete evaluation includes B-mode imaging, spectral Doppler, color Doppler, and power Doppler as needed of all accessible portions of each vessel. Bilateral testing is considered an integral part of a complete examination. Limited examinations for reoccurring indications may be performed as noted. The reflux portion of the exam is performed with the patient in  reverse Trendelenburg.  +-----+---------------+---------+-----------+----------+--------------+ RIGHTCompressibilityPhasicitySpontaneityPropertiesThrombus Aging +-----+---------------+---------+-----------+----------+--------------+ CFV  Full           Yes      Yes                                 +-----+---------------+---------+-----------+----------+--------------+   +---------+---------------+---------+-----------+----------+--------------+ LEFT     CompressibilityPhasicitySpontaneityPropertiesThrombus Aging +---------+---------------+---------+-----------+----------+--------------+ CFV      Full           Yes      Yes                                 +---------+---------------+---------+-----------+----------+--------------+ SFJ      Full                                                        +---------+---------------+---------+-----------+----------+--------------+ FV Prox  Full           Yes      Yes                                 +---------+---------------+---------+-----------+----------+--------------+ FV Mid   Full           Yes      Yes                                 +---------+---------------+---------+-----------+----------+--------------+ FV DistalFull           Yes      Yes                                 +---------+---------------+---------+-----------+----------+--------------+ PFV      Full                                                        +---------+---------------+---------+-----------+----------+--------------+ POP      Full           Yes      Yes                                 +---------+---------------+---------+-----------+----------+--------------+  PTV      Full                                                        +---------+---------------+---------+-----------+----------+--------------+ PERO     Full                                                         +---------+---------------+---------+-----------+----------+--------------+  No evidence of lower extremity deep venous thrombosis.   *See table(s) above for measurements and observations. Electronically signed by Monica Martinez MD on 08/01/2021 at 4:42:40 PM.    Final     Impression:  Stage IIA (cT2,cN1, cM0) Right Breast UOQ, Invasive Carcinoma with DCIS, ER+ / PR+ / Her2-, Grade 2   The patient is recovering from the effects of radiation.  She continues to have pain in the right breast but hopefully with more time and aggressive physical therapy this will become less of an issue.  In the meantime I have refilled the patient's oxycodone.  Patient does report stopping her letrozole after discussing with Dr. Lindi Rodriguez.  She apparently developed some swelling in her lower extremities this medication.  Plan: Routine follow-up in 3 months.   ____________________________________  Blair Promise, PhD, MD   This document serves as a record of services personally performed by Gery Pray, MD. It was created on his behalf by Roney Mans, a trained medical scribe. The creation of this record is based on the scribe's personal observations and the provider's statements to them. This document has been checked and approved by the attending provider.

## 2021-08-03 NOTE — Progress Notes (Signed)
   Melody Rodriguez - 45 y.o. female MRN [id number]  Date of birth: 01-03-1976  Office Visit Note: Visit Date: 08/03/2021 PCP: Patient, [name] Referred by: Mcarthur Rossetti*  Subjective: Chief Complaint  Patient presents with   Right Shoulder - Pain   HPI:  Melody Rodriguez is a 45 y.o. female who comes in today at the request of Dr. Jean Rosenthal for planned Right anesthetic glenohumeral arthrogram with fluoroscopic guidance.  The patient has failed conservative care including home exercise, medications, time and activity modification.  This injection will be diagnostic and hopefully therapeutic.  Please see requesting physician notes for further details and justification.  ROS Otherwise per HPI.  Assessment & Plan: Visit Diagnoses:    ICD-10-CM   1. Chronic right shoulder pain  M25.511 XR C-ARM NO REPORT   G89.29 Large Joint Inj: R glenohumeral      Plan: No additional findings.   Meds & Orders: No orders of the defined types were placed in this encounter.   Orders Placed This Encounter  Procedures   Large Joint Inj: R glenohumeral   XR C-ARM NO REPORT    Follow-up: Return for visit to requesting provider as needed.   Procedures: Large Joint Inj: R glenohumeral on 08/03/2021 8:45 AM Indications: pain and diagnostic evaluation Details: 22 G 3.5 in needle, fluoroscopy-guided anteromedial approach  Arthrogram: No  Medications: 3 mL bupivacaine 0.5 %; 40 mg triamcinolone acetonide 40 MG/ML; 5 mL bupivacaine 0.25 % Outcome: tolerated well, no immediate complications  There was excellent flow of contrast producing a partial arthrogram of the glenohumeral joint. The patient HAD NO relief of symptoms during the anesthetic phase of the injection. Procedure, treatment alternatives, risks and benefits explained, specific risks discussed. Consent was given by the patient. Immediately prior to procedure a time out was called to verify the correct patient,  procedure, equipment, support staff and site/side marked as required. Patient was prepped and draped in the usual sterile fashion.         Clinical History: No specialty comments available.     Objective:  VS:  HT:    WT:   BMI:     BP:   HR: bpm  TEMP: ( )  RESP:  Physical Exam   Imaging: No results found.

## 2021-08-03 NOTE — Progress Notes (Incomplete)
  Radiation Oncology         [phone] ________________________________  Patient Name: Melody Rodriguez MRN: [id number] DOB: 04-22-1976 Referring Physician: Nicholas Lose (Profile Not Attached) Date of Service: 07/01/2021 [facility], [place]  End Of Treatment Note  Diagnoses: C50.411-Malignant neoplasm of upper-outer quadrant of right female breast  Cancer Staging: Stage IIA (cT2,cN1, cM0) Right Breast UOQ, Invasive Carcinoma with DCIS, ER+ / PR+ / Her2-, Grade 2  Intent: Curative  Radiation Treatment Dates: 05/10/2021 through 07/01/2021 Site Technique Total Dose (Gy) Dose per Fx (Gy) Completed Fx Beam Energies  Breast, Right: Breast_Rt 3D 50.4/50.4 1.8 28/28 10X  Breast, Right: Breast_Rt_Bst specialPort 12/12 2 6/6 15E  Sclav-RT: SCV_Rt 3D 50.4/50.4 1.8 28/28 6X, 10X   Narrative: The patient tolerated radiation therapy relatively well. On 06/28/21, the patient reported pain to right breast rated at a 6/10, for which she used Tylenol and ibuprofen without relief. Patient also reports moderate fatigue, and persistent swelling and tenderness of right breast. She is no longer using sonafine because she understood that it was for itching and hydrocortisone improved it better. I encouraged the patient to use sonafine to protect her skin.   On physical exam: the right supraclavicular area shows dry desquamation but no active moist desquamation.  The breast area shows erythema and hyperpigmentation changes without any skin breakdown.  Some swelling is noted. Patient reports using neosporin to area of dry desquamation. Patient has full range of motion, however, she reports pain when lifting arm. Patient continues to wear compression sleeve to right arm. Appetite is fair.  Of note: The patient does have oxycodone which she is only taking on the weekend as she has been taking care of her child and taking her to school.  She is only taking 1/2 a tablet which works well for her  pain issues.  Plan: The patient will follow-up with radiation oncology in one month .  ________________________________________________ -----------------------------------  Blair Promise, PhD, MD  This document serves as a record of services personally performed by Gery Pray, MD. It was created on his behalf by Roney Mans, a trained medical scribe. The creation of this record is based on the scribe's personal observations and the provider's statements to them. This document has been checked and approved by the attending provider.

## 2021-08-04 ENCOUNTER — Other Ambulatory Visit (HOSPITAL_COMMUNITY): Payer: Self-pay

## 2021-08-04 ENCOUNTER — Ambulatory Visit
Admission: RE | Admit: 2021-08-04 | Discharge: 2021-08-04 | Disposition: A | Discharge status: Home / Self Care | Payer: Medicaid Other | Source: Ambulatory Visit | Attending: Radiation Oncology | Admitting: Radiation Oncology

## 2021-08-04 ENCOUNTER — Encounter: Payer: Self-pay | Admitting: Radiation Oncology

## 2021-08-04 DIAGNOSIS — Z170 Estrogen receptor positive status [ER+]: Secondary | ICD-10-CM | POA: Diagnosis not present

## 2021-08-04 DIAGNOSIS — C50411 Malignant neoplasm of upper-outer quadrant of right female breast: Secondary | ICD-10-CM | POA: Diagnosis present

## 2021-08-04 DIAGNOSIS — Z79899 Other long term (current) drug therapy: Secondary | ICD-10-CM | POA: Insufficient documentation

## 2021-08-04 DIAGNOSIS — Z79811 Long term (current) use of aromatase inhibitors: Secondary | ICD-10-CM | POA: Diagnosis not present

## 2021-08-04 DIAGNOSIS — R197 Diarrhea, unspecified: Secondary | ICD-10-CM | POA: Diagnosis not present

## 2021-08-04 DIAGNOSIS — L598 Other specified disorders of the skin and subcutaneous tissue related to radiation: Secondary | ICD-10-CM | POA: Diagnosis not present

## 2021-08-04 DIAGNOSIS — Z923 Personal history of irradiation: Secondary | ICD-10-CM | POA: Insufficient documentation

## 2021-08-04 DIAGNOSIS — E1165 Type 2 diabetes mellitus with hyperglycemia: Secondary | ICD-10-CM | POA: Diagnosis not present

## 2021-08-04 MED ORDER — OXYCODONE HCL 5 MG PO TABS
5.0000 mg | ORAL_TABLET | Freq: Four times a day (QID) | ORAL | 0 refills | Status: DC | PRN
  Filled 2021-08-04: qty 30, 4d supply, fill #0

## 2021-08-04 NOTE — Progress Notes (Signed)
Melody Rodriguez is here today for follow up post radiation to the breast.   Breast Side: Right   They completed their radiation on: 07/01/21  Does the patient complain of any of the following: Post radiation skin issues: Patient continues to have darkened skin to neck and shoulder. Patient reports improvement to skin to breast.  Breast Tenderness: yes Breast Swelling: yes Lymphadema: Patient continues to have pain to right shoulder. Patient continues to go to physical therapy. Continues to wear compression sleeve. Patient recently had cortisone injection to right shoulder, provided minimal relief.  Range of Motion limitations: Patient has limited range of motion. Patient reports having cording under right arm.  Fatigue post radiation: Continues to have fatigue.  Appetite good/fair/poor: Reports having a good appetite.   Additional comments if applicable:  Vitals:   00/76/22 0909  BP: 120/77  Pulse: 98  Resp: 20  Temp: 97.9 F (36.6 C)  SpO2: 98%  Weight: 206 lb 12.8 oz (93.8 kg)  Height: 5\' 4"  (1.626 m)

## 2021-08-08 ENCOUNTER — Other Ambulatory Visit: Payer: Self-pay

## 2021-08-08 ENCOUNTER — Ambulatory Visit: Payer: Medicaid Other

## 2021-08-08 VITALS — Wt 205.1 lb

## 2021-08-08 DIAGNOSIS — I890 Lymphedema, not elsewhere classified: Secondary | ICD-10-CM

## 2021-08-08 DIAGNOSIS — M25611 Stiffness of right shoulder, not elsewhere classified: Secondary | ICD-10-CM

## 2021-08-08 DIAGNOSIS — Z483 Aftercare following surgery for neoplasm: Secondary | ICD-10-CM

## 2021-08-08 DIAGNOSIS — R293 Abnormal posture: Secondary | ICD-10-CM

## 2021-08-08 DIAGNOSIS — Z170 Estrogen receptor positive status [ER+]: Secondary | ICD-10-CM

## 2021-08-08 DIAGNOSIS — L599 Disorder of the skin and subcutaneous tissue related to radiation, unspecified: Secondary | ICD-10-CM

## 2021-08-08 DIAGNOSIS — C50411 Malignant neoplasm of upper-outer quadrant of right female breast: Secondary | ICD-10-CM

## 2021-08-08 DIAGNOSIS — M25511 Pain in right shoulder: Secondary | ICD-10-CM

## 2021-08-08 NOTE — Therapy (Addendum)
[facility] @ [name], [place], [address] Phone: [phone]   Fax:  [phone]  Physical Therapy Treatment  Patient Details  Name: Melody Rodriguez MRN: [id number] Date of Birth: May 17, 1976 Referring Provider (PT): Dr. Coralie Rodriguez   Encounter Date: 08/08/2021   PT End of Session - 08/08/21 0932     Visit Number 17    Number of Visits 20    Date for PT Re-Evaluation 08/10/21    Authorization Type [employer] - needs auth after 20 visits    Authorization - Visit Number 42    Authorization - Number of Visits 20    PT Start Time 0901    PT Stop Time 1003    PT Time Calculation (min) 62 min    Activity Tolerance Treatment limited secondary to medical complications (Comment)    Behavior During Therapy [facility] for tasks assessed/performed             Past Medical History:  Diagnosis Date   Breast cancer (Melody Rodriguez)    Family history of colon cancer    History of radiation therapy    right breast/scv  05/10/21-07/01/21  Dr Melody Rodriguez    Past Surgical History:  Procedure Laterality Date   BREAST BIOPSY Right 02/18/2021   Procedure: EVACUATION HEMATOMA RIGHT AXILLA;  Surgeon: Melody Keens, MD;  Location: [facility];  Service: General;  Laterality: Right;   BREAST CYST EXCISION Right    Patient does not recall (2014 or 2015)   BREAST LUMPECTOMY WITH RADIOACTIVE SEED AND SENTINEL LYMPH NODE BIOPSY Right 02/07/2021   Procedure: RIGHT BREAST LUMPECTOMY WITH RADIOACTIVE SEED AND SEED TARGETED LYMPH NODE BIOPSY AND SENTINEL LYMPH NODE BIOPSY;  Surgeon: Melody Keens, MD;  Location: [facility];  Service: General;  Laterality: Right;   CESAREAN SECTION     IR IMAGING GUIDED PORT INSERTION  09/15/2020   IR REMOVAL TUN CV CATH W/O FL  09/15/2020   NODE DISSECTION Right 03/28/2021   Procedure: RIGHT AXILLARY LYMPH NODE DISSECTION;  Surgeon: Melody Keens, MD;  Location: [facility];  Service: General;  Laterality: Right;   PORT-A-CATH REMOVAL Left 02/07/2021   Procedure: REMOVAL PORT-A-CATH;  Surgeon: Melody Keens, MD;  Location: [facility];  Service: General;  Laterality: Left;   THYROIDECTOMY, PARTIAL      Vitals:   08/08/21 0904  Weight: 205 lb 2 oz (93 kg)     Subjective Assessment - 08/08/21 0904     Subjective I did not get the relief that the doctor said I should after the second shot. I'm not sure what is next with the orthopedist because he said the second shot should have given me immediate relief. I did find a PCP that takes Medicaid and I see Dr. Glennon Rodriguez today with [facility]. I'm really over my Rt shoulder pain. I have times it hurts less, but overall it bothers me most of the time during the day and night.    Pertinent History Patient was diagnosed on 07/13/2020 with right grade 2 invasive ductal carcinoma breast cancer. It is ER/PR positive and HER2 negative with a Ki67 of 20%. Patient underwent neoadjuvant chemotherapy from 09/01/2020 - 01/14/2021. She had a right lumpectomy and sentinel node biopsy (1/1 nodes positive) on 02/07/2021. Hematoma evacuation on 02/18/2021. Axillary dissection on 03/28/2021 with 3/7 positive nodes. Iron deficiency. Radiation starts 05/10/2021    Patient Stated Goals Get my arm better    Currently in  Pain? Yes    Pain Score 6     Pain Location Shoulder    Pain Orientation Right    Pain Descriptors / Indicators Aching    Pain Type Chronic pain    Pain Onset More than a month ago    Pain Frequency Constant    Aggravating Factors  laying down and during the day with ADLs    Pain Relieving Factors this week not sure, the second shot didn't seem to do much                [facility] PT Assessment - 08/08/21 0001       AROM   Right Shoulder Flexion 101 Degrees    Right Shoulder ABduction 114 Degrees                L-DEX FLOWSHEETS - 08/08/21 0900       L-DEX LYMPHEDEMA SCREENING    Measurement Type Unilateral    L-DEX MEASUREMENT EXTREMITY Upper Extremity    POSITION  Standing    DOMINANT SIDE Right    At Risk Side Right    BASELINE SCORE (UNILATERAL) 0.4    L-DEX SCORE (UNILATERAL) -4.2    VALUE CHANGE (UNILAT) -4.6                       [facility] Adult PT Treatment/Exercise - 08/08/21 0001       Self-Care   Other Self-Care Comments  Spent timediscussing pts current functional status and how overall in past few weeks she feels she has plateued so pt is agreeable to D/C at this time.      Shoulder Exercises: Pulleys   Flexion 3 minutes    ABduction 2 minutes    ABduction Limitations Pt still with some improved ROM but reports pain with this today      Prosthetics   Prosthetic Care Comments  Measured pt for a compression sleeve and gauntlet as she reports having great relief from wearing the TG soft. She best fits into a jobst Bella Lite sleeve size med and gauntlet size sm.                          PT Long Term Goals - 07/18/21 6440       PT LONG TERM GOAL #1   Title Patient will demonstrate she has regained full shoulder ROM and function post operatively compared to baselines.    Time 4    Period Weeks    Status On-going    Target Date 08/10/21      PT LONG TERM GOAL #2   Title Patient will increase right shoulder active flexion to >/= 120 degrees for inceased ease reaching overhead.    Time 4    Status Achieved      PT LONG TERM GOAL #3   Title Patient will increase her right shoulder active abduction to >/= 130 degrees to obtain radiation positioning.    Baseline 130    Time 4    Period Weeks    Status Achieved    Target Date 07/18/21      PT LONG TERM GOAL #4   Title Patient will improve her DASH score to </= 53 for improved arm function.    Time 4    Period Weeks    Status Achieved      PT LONG TERM GOAL #5   Title Patient will report she has a good understanding  of lymphedema risk reduction practices.     Time 4    Period Weeks    Status Achieved      PT LONG TERM GOAL #6   Title Pt will be independent in a home exercise program for continued strengthening and stretching.    Time 4    Period Weeks      PT LONG TERM GOAL #7   Title Pt will report a 50% improvement in pain to allow her to clean the bathroom and do ADLs with improved comfort.    Time 4    Period Weeks    Status On-going      PT LONG TERM GOAL #8   Title Pt will be independent in self MLD for long term management of edema.    Time 4    Period Weeks    Status On-going    Target Date 08/10/21      PT LONG TERM GOAL  #9   TITLE Pt will be able to reach up in to the cabinet with her right arm to put up dishes to allow her to return to prior level of function.    Baseline uses L UE; pt able to use Rt arm about 50% of time with this now 07/18/2021    Time 4    Period Weeks    Status On-going    Target Date 08/10/21                   Plan - 08/08/21 1303     Clinical Impression Statement Pt comes in reporting that overall she does not feel her pain has reduced since having the 2 cortisone shots. Discussed her current functional progress and included Leone Payor, PT and it was decided pt will be D/C to HEP today and continue working on her A/ROM in pain free ROM and progressing HEP from isometrics to theraband as her pain allows. Pt able to verbalize good understanding and is agreeable to D/C. She knows she can return to this clinic in future if more physical therapy required. She was also measured for a compression sleeve and gauntlet today and will send demographics to [facility] with pt permission. Pts measurements fit best into a Jobst Bella Lite med sleeve and sm gauntlet.    Personal Factors and Comorbidities Comorbidity 3+    Comorbidities muliple surgeries, chemotherapy    Examination-Activity Limitations Reach Overhead;Lift;Carry    Examination-Participation Restrictions Occupation    Stability/Clinical  Decision Making Stable/Uncomplicated    Rehab Potential Good    PT Frequency 2x / week    PT Duration 4 weeks    PT Treatment/Interventions Therapeutic exercise;Patient/family education;Manual techniques;Manual lymph drainage;Scar mobilization;Passive range of motion;ADLs/Self Care Home Management;Orthotic Fit/Training;Vasopneumatic Device;Taping    PT Next Visit Plan D/C this visit. Cont every 3 month L-Dex screen from her [facility] (03/29/23)    PT Home Exercise Plan Post op shoulder ROM HEP, supine dowel exercises; isometrics for Rt shoulder    Consulted and Agree with Plan of Care Patient             Patient will benefit from skilled therapeutic intervention in order to improve the following deficits and impairments:  Postural dysfunction, Decreased range of motion, Pain, Impaired UE functional use, Decreased knowledge of precautions, Increased fascial restricitons, Decreased scar mobility, Increased edema, Decreased strength  Visit Diagnosis: Lymphedema, not elsewhere classified  Disorder of the skin and subcutaneous tissue related to radiation, unspecified  Aftercare following surgery for neoplasm  Stiffness of  right shoulder, not elsewhere classified  Acute pain of right shoulder  Abnormal posture  Malignant neoplasm of upper-outer quadrant of right breast in female, estrogen receptor positive [facility])     Problem List Patient Active Problem List   Diagnosis Date Noted   Bartholin cyst 05/06/2021   Amenorrhea, secondary 03/02/2021   PICC (peripherally inserted central catheter) in place 09/09/2020   Genetic testing 08/11/2020   Iron deficiency anemia due to chronic blood loss 08/04/2020   Family history of colon cancer    Malignant neoplasm of upper-outer quadrant of right breast in female, estrogen receptor positive ([place]) 07/28/2020  PHYSICAL THERAPY DISCHARGE SUMMARY  Visits from Start of Care: 17  Current functional level related to goals / functional  outcomes: Partially achieved goals   Remaining deficits: Continues with Right shoulder pain and limitations in ROM/function   Education / Equipment: Ordered compression sleeve/gauntlet    Patient agrees to discharge. Patient goals were partially met. Patient is being discharged due to lack of progress. Pt has reached a plateau and is not continuing to improve.  Otelia Limes, PTA 08/08/2021, 4:08 PM  [facility] @ [name], [place], [address] Phone: [phone]   Fax:  [phone]  Name: Melody Rodriguez MRN: [id number] Date of Birth: Feb 03, 1976

## 2021-08-15 ENCOUNTER — Other Ambulatory Visit (HOSPITAL_COMMUNITY): Payer: Self-pay

## 2021-08-15 MED ORDER — GLIPIZIDE 5 MG PO TABS
ORAL_TABLET | ORAL | 5 refills | Status: DC
  Filled 2021-11-08: qty 60, 30d supply, fill #0
  Filled 2021-11-25: qty 60, 30d supply, fill #1

## 2021-08-15 MED ORDER — ATORVASTATIN CALCIUM 10 MG PO TABS
ORAL_TABLET | ORAL | 5 refills | Status: DC
  Filled 2021-08-15: qty 30, 30d supply, fill #0
  Filled 2021-09-05: qty 30, 30d supply, fill #1
  Filled 2021-10-09: qty 30, 30d supply, fill #2
  Filled 2021-11-08: qty 30, 30d supply, fill #3
  Filled 2021-11-25: qty 30, 30d supply, fill #4
  Filled 2022-01-15: qty 30, 30d supply, fill #5

## 2021-08-15 MED ORDER — GLIPIZIDE 5 MG PO TABS
ORAL_TABLET | ORAL | 5 refills | Status: DC
  Filled 2021-08-15: qty 60, 30d supply, fill #0

## 2021-08-16 MED ORDER — BUPIVACAINE HCL 0.5 % IJ SOLN
3.0000 mL | INTRAMUSCULAR | Status: AC | PRN
Start: 2021-08-03 — End: 2021-08-03
  Administered 2021-08-03: 3 mL via INTRA_ARTICULAR

## 2021-08-16 MED ORDER — TRIAMCINOLONE ACETONIDE 40 MG/ML IJ SUSP
40.0000 mg | INTRAMUSCULAR | Status: AC | PRN
Start: 2021-08-03 — End: 2021-08-03
  Administered 2021-08-03: 40 mg via INTRA_ARTICULAR

## 2021-08-16 MED ORDER — BUPIVACAINE HCL 0.25 % IJ SOLN
5.0000 mL | INTRAMUSCULAR | Status: AC | PRN
Start: 2021-08-03 — End: 2021-08-03
  Administered 2021-08-03: 5 mL via INTRA_ARTICULAR

## 2021-08-17 NOTE — Progress Notes (Signed)
HEMATOLOGY-ONCOLOGY TELEPHONE VISIT PROGRESS NOTE  I connected with Melody Rodriguez on 08/18/2021 at  3:00 PM EST by telephone and verified that I am speaking with the correct person using two identifiers.  I discussed the limitations, risks, security and privacy concerns of performing an evaluation and management service by telephone and the availability of in person appointments.  I also discussed with the patient that there may be a patient responsible charge related to this service. The patient expressed understanding and agreed to proceed.   History of Present Illness: Melody Rodriguez is a 45 y.o. female with above-mentioned history of right breast cancer who completed neoadjuvant chemotherapy and lumpectomy. She presents via telephone today for follow-up.  In spite of stopping letrozole her muscle aches and pains do not improve.  She is working with her primary care physician to get her blood sugars under control.  Oncology History  Malignant neoplasm of upper-outer quadrant of right breast in female, estrogen receptor positive ([place])  07/28/2020 Initial Diagnosis   Patient palpated a right breast mass for 1-2 years. Mammogram showed a 2.2cm mass at the 11 o'clock position with surrounding calcifications, 6.4cm in total extent, and up to 5 abnormal right axillary lymph nodes. 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    08/05/2020 Miscellaneous   MammaPrint: High risk luminal type B   08/11/2020 Genetic Testing   Negative genetic testing: no pathogenic variants detected in [facility] Breast Cancer STAT Panel or Common Hereditary Cancers panel. The report dates are August 11, 2020 and August 19, 2020, respectively. Two variants of uncertain signficance were detected - one in the CTNNA1 gene called c.86del and the second in the MLH1 gene called c.808A>G.    UPDATE:  The MLH1 c.808A>G VUS was reclassified to "Likely Benign" on 02/19/2021. The change in variant classification was made as a result of re-review of the evidence in light of new variant interpretation guidelines and/or new information.   The STAT Breast cancer panel offered by [facility] includes sequencing and rearrangement analysis for the following 9 genes:  ATM, BRCA1, BRCA2, CDH1, CHEK2, PALB2, PTEN, STK11 and TP53.  The Common Hereditary Cancers Panel offered by [facility] includes sequencing and/or deletion duplication testing of the following 48 genes: APC, ATM, AXIN2, BARD1, BMPR1A, BRCA1, BRCA2, BRIP1, CDH1, CDK4, CDKN2A (p14ARF), CDKN2A (p16INK4a), CHEK2, CTNNA1, DICER1, EPCAM (Deletion/duplication testing only), GREM1 (promoter region deletion/duplication testing only), KIT, MEN1, MLH1, MSH2, MSH3, MSH6, MUTYH, NBN, NF1, NTHL1, PALB2, PDGFRA, PMS2, POLD1, POLE, PTEN, RAD50, RAD51C, RAD51D, RNF43, SDHB, SDHC, SDHD, SMAD4, SMARCA4. STK11, TP53, TSC1, TSC2, and VHL.  The following genes were evaluated for sequence changes only: SDHA and HOXB13 c.251G>A variant only.    09/01/2020 - 01/11/2021 Chemotherapy   Patient is on Treatment Plan : BREAST Dose Dense AC q14d / PACLitaxel D1,8,15 q21d (Carbo removed by Dr. Lindi Adie)     02/07/2021 Surgery   Right lumpectomy [name]): invasive and in situ ductal carcinoma, 2.8cm, clear margins, with metastatic carcinoma in 1/1 right axillary lymph nodes.     Observations/Objective:     Assessment Plan:  Malignant neoplasm of upper-outer quadrant of right breast in female, estrogen receptor positive ([place]) 07/28/2020:Patient palpated a right breast mass for 1-2 years. Mammogram showed a 2.2cm mass at the 11 o'clock position with surrounding calcifications, 6.4cm in total extent, and up to 5 abnormal right axillary lymph nodes. Biopsy  showed invasive and in situ ductal carcinoma in the breast and axilla, grade 2, HER-2 equivocal by IHC (2+), negative by  FISH (ratio 1.6), ER+ 50% weak, PR+ 20%, Ki67 20%.   Treatment plan: 1. Neoadjuvant chemotherapy (MammaPrint test High Risk): AC foll by Taxol completed 01/11/21 2. 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 Adjuvant radiation therapy 05/11/2021-07/04/2021 4. Follow-up adjuvant antiestrogen therapy along with abemaciclib (patient has total of 4 lymph nodes positive), to start 07/19/2021 [facility] 16070: Treatment of refractory nausea 5. ALND 03/28/21: 3/7 LN positive -------------------------------------------------------------------------------------------------------------------------------  Current treatment: Zoladex plus letrozole plus Verzenio (Not started yet) Toxicities: Leg pains: inspite of stopping letrozole her leg pains did not improve. So she will restart letrozole RTC monthly for Injections  Suspect her pains are from diabetes complications She wants a handicap placard (which can be picked up on 12/12) Return to clinic for SCP visit    I discussed the assessment and treatment plan with the patient. The patient was provided an opportunity to ask questions and all were answered. The patient agreed with the plan and demonstrated an understanding of the instructions. The patient was advised to call back or seek an in-person evaluation if the symptoms worsen or if the condition fails to improve as anticipated.   Total time spent: 30 mins including non-face to face time and time spent for planning, charting and coordination of care  Rulon Eisenmenger, MD 08/18/2021    I, Thana Ates, am acting as scribe for Nicholas Lose, MD.  I have reviewed the above documentation for accuracy and completeness, and I agree with the above.

## 2021-08-18 ENCOUNTER — Inpatient Hospital Stay: Payer: Medicaid Other | Attending: Hematology and Oncology | Admitting: Hematology and Oncology

## 2021-08-18 DIAGNOSIS — Z170 Estrogen receptor positive status [ER+]: Secondary | ICD-10-CM | POA: Diagnosis not present

## 2021-08-18 DIAGNOSIS — Z79811 Long term (current) use of aromatase inhibitors: Secondary | ICD-10-CM | POA: Insufficient documentation

## 2021-08-18 DIAGNOSIS — C773 Secondary and unspecified malignant neoplasm of axilla and upper limb lymph nodes: Secondary | ICD-10-CM | POA: Insufficient documentation

## 2021-08-18 DIAGNOSIS — C50411 Malignant neoplasm of upper-outer quadrant of right female breast: Secondary | ICD-10-CM | POA: Insufficient documentation

## 2021-08-18 DIAGNOSIS — Z7984 Long term (current) use of oral hypoglycemic drugs: Secondary | ICD-10-CM | POA: Insufficient documentation

## 2021-08-18 DIAGNOSIS — Z5111 Encounter for antineoplastic chemotherapy: Secondary | ICD-10-CM | POA: Insufficient documentation

## 2021-08-18 DIAGNOSIS — Z800 Family history of malignant neoplasm of digestive organs: Secondary | ICD-10-CM | POA: Insufficient documentation

## 2021-08-18 DIAGNOSIS — E119 Type 2 diabetes mellitus without complications: Secondary | ICD-10-CM | POA: Insufficient documentation

## 2021-08-18 DIAGNOSIS — Z923 Personal history of irradiation: Secondary | ICD-10-CM | POA: Insufficient documentation

## 2021-08-18 DIAGNOSIS — Z79899 Other long term (current) drug therapy: Secondary | ICD-10-CM | POA: Insufficient documentation

## 2021-08-18 NOTE — Assessment & Plan Note (Signed)
07/28/2020:Patient palpated a right breast mass for 1-2 years. Mammogram showed a 2.2cm mass at the 11 o'clock position with surrounding calcifications, 6.4cm in total extent, and up to 5 abnormal right axillary lymph nodes. 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  Treatment plan: 1. 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 Adjuvant radiation therapy 05/11/2021-07/04/2021 4. Follow-up adjuvant antiestrogen therapy along with abemaciclib (patient has total of 4 lymph nodes positive), to start 07/19/2021 [facility] 16070: Treatment of refractory nausea 5. ALND 03/28/21: 3/7 LN positive ------------------------------------------------------------------------------------------------------------------------------- Current treatment: Zoladex plus letrozole plus Verzenio Toxicities:  Return to clinic

## 2021-08-19 ENCOUNTER — Other Ambulatory Visit (HOSPITAL_COMMUNITY)
Admission: RE | Admit: 2021-08-19 | Discharge: 2021-08-19 | Disposition: A | Discharge status: Home / Self Care | Payer: Medicaid Other | Source: Ambulatory Visit | Attending: Family Medicine | Admitting: Family Medicine

## 2021-08-19 ENCOUNTER — Ambulatory Visit (INDEPENDENT_AMBULATORY_CARE_PROVIDER_SITE_OTHER): Payer: Medicaid Other | Admitting: Family Medicine

## 2021-08-19 ENCOUNTER — Encounter: Payer: Self-pay | Admitting: Family Medicine

## 2021-08-19 ENCOUNTER — Other Ambulatory Visit: Payer: Self-pay

## 2021-08-19 VITALS — BP 133/82 | HR 89 | Ht 64.0 in | Wt 209.4 lb

## 2021-08-19 DIAGNOSIS — Z124 Encounter for screening for malignant neoplasm of cervix: Secondary | ICD-10-CM | POA: Insufficient documentation

## 2021-08-19 DIAGNOSIS — N750 Cyst of Bartholin's gland: Secondary | ICD-10-CM | POA: Diagnosis not present

## 2021-08-19 NOTE — Assessment & Plan Note (Signed)
Reviewed anatomical pictures and discussed likely etiology of her resolved symptoms. 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 Reassured patient that this is not a serious condition but is quite common, and reviewed options of Ward catheter and marsupialization if she has a recurrence.

## 2021-08-19 NOTE — Progress Notes (Signed)
GYNECOLOGY OFFICE VISIT NOTE  History:   Melody Rodriguez is a 45 y.o. G2P2 with hx of two prior cesareans, recent diagnosis of breast cancer s/p chemo/radiation, here today for concern for labial cyst.  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, sugars have been in the 200-300's and she was recently started on diabetes pills She has also been taking letrozole for her breast cancer and has not had any period in months Reports she had a pap at an outside clinic but no record is available  Health Maintenance Due  Topic Date Due   COVID-19 Vaccine (1) Never done   Pneumococcal Vaccine 44-18 Years old (1 - PCV) Never done   HIV Screening  Never done   Hepatitis C Screening  Never done   TETANUS/TDAP  Never done   PAP SMEAR-Modifier  Never done   MAMMOGRAM  07/13/2021    Past Medical History:  Diagnosis Date   Breast cancer ([place])    Diabetes mellitus without complication ([place])    Family history of colon cancer    History of radiation therapy    right breast/scv  05/10/21-07/01/21  Dr Gery Pray    Past Surgical History:  Procedure Laterality Date   BREAST BIOPSY Right 02/18/2021   Procedure: EVACUATION HEMATOMA RIGHT AXILLA;  Surgeon: [name], MD;  Location: [facility];  Service: General;  Laterality: Right;   BREAST CYST EXCISION Right    Patient does not recall (2014 or 2015)   BREAST LUMPECTOMY WITH RADIOACTIVE SEED AND SENTINEL LYMPH NODE BIOPSY Right 02/07/2021   Procedure: RIGHT BREAST LUMPECTOMY WITH RADIOACTIVE SEED AND SEED TARGETED LYMPH NODE BIOPSY AND SENTINEL LYMPH NODE BIOPSY;  Surgeon: [name], MD;   Location: [facility];  Service: General;  Laterality: Right;   CESAREAN SECTION     IR IMAGING GUIDED PORT INSERTION  09/15/2020   IR REMOVAL TUN CV CATH W/O FL  09/15/2020   NODE DISSECTION Right 03/28/2021   Procedure: RIGHT AXILLARY LYMPH NODE DISSECTION;  Surgeon: [name], MD;  Location: [place];  Service: General;  Laterality: Right;   PORT-A-CATH REMOVAL Left 02/07/2021   Procedure: REMOVAL PORT-A-CATH;  Surgeon: [name], MD;  Location: [place];  Service: General;  Laterality: Left;   THYROIDECTOMY, PARTIAL      The following portions of the patient's history were reviewed and updated as appropriate: allergies, current medications, past family history, past medical history, past social history, past surgical history and problem list.   Health Maintenance:   Last pap: No results found for: DIAGPAP, HPV, HPVHIGH None on file  Last mammogram:  N/a, being followed for breast cancer    Review of Systems:  Pertinent items noted in HPI and remainder of comprehensive ROS otherwise negative.  Physical Exam:  BP 133/82   Pulse 89   Ht 5\' 4"  (1.626 m)   Wt 209 lb 6.4 oz (95 kg)   BMI 35.94 kg/m  CONSTITUTIONAL: Well-developed, well-nourished female in no acute distress.  HEENT:  Normocephalic, atraumatic. External right and left ear normal. No scleral icterus.  NECK: Normal range of motion, supple, no  masses noted on observation SKIN: No rash noted. Not diaphoretic. No erythema. No pallor. MUSCULOSKELETAL: Normal range of motion. No edema noted. NEUROLOGIC: Alert and oriented to person, place, and time. Normal muscle tone coordination.  PSYCHIATRIC: Normal mood and affect. Normal behavior. Normal judgment and thought content. RESPIRATORY: Effort normal, no problems with respiration noted PELVIC: Normal appearing external genitalia; mildly atrophic appearing vaginal mucosa. Cervix with nulliparous appearance,  nabothian cyst at 7 o clock, TZ not seen, nodular enlargment present at 12 o clock that palpated as soft on bimanual exam.  No abnormal discharge noted.   Labs and Imaging No results found for this or any previous visit (from the past 168 hour(s)). XR C-ARM NO REPORT  Result Date: 08/03/2021 Please see Notes tab for imaging impression.  XR Shoulder Right  Result Date: 07/27/2021 3 views of the right shoulder show no acute findings.  VAS [place] LOWER EXTREMITY VENOUS (DVT)  Result Date: 08/01/2021  Lower Venous DVT Study Patient Name:  Melody Rodriguez  Date of Exam:   08/01/2021 Medical Rec #: [id number]     Accession #:    [id number] Date of Birth: 11/08/1975     Patient Gender: F Patient Age:   9 years Exam Location:  [facility] Procedure:      VAS [place] LOWER EXTREMITY VENOUS (DVT) Referring Phys: Sherol Dade --------------------------------------------------------------------------------  Indications: Pain, and Swelling.  Risk Factors: CA patient on chemotherapy. Comparison Study: No previous exams Performing Technologist: Jody Hill RVT, RDMS  Examination Guidelines: A complete evaluation includes B-mode imaging, spectral Doppler, color Doppler, and power Doppler as needed of all accessible portions of each vessel. Bilateral testing is considered an integral part of a complete examination. Limited examinations for reoccurring indications may be performed as noted. The reflux portion of the exam is performed with the patient in reverse Trendelenburg.  +-----+---------------+---------+-----------+----------+--------------+ RIGHTCompressibilityPhasicitySpontaneityPropertiesThrombus Aging +-----+---------------+---------+-----------+----------+--------------+ CFV  Full           Yes      Yes                                 +-----+---------------+---------+-----------+----------+--------------+   +---------+---------------+---------+-----------+----------+--------------+ LEFT      CompressibilityPhasicitySpontaneityPropertiesThrombus Aging +---------+---------------+---------+-----------+----------+--------------+ CFV      Full           Yes      Yes                                 +---------+---------------+---------+-----------+----------+--------------+ SFJ      Full                                                        +---------+---------------+---------+-----------+----------+--------------+ FV Prox  Full           Yes      Yes                                 +---------+---------------+---------+-----------+----------+--------------+ FV Mid   Full           Yes      Yes                                 +---------+---------------+---------+-----------+----------+--------------+  FV DistalFull           Yes      Yes                                 +---------+---------------+---------+-----------+----------+--------------+ PFV      Full                                                        +---------+---------------+---------+-----------+----------+--------------+ POP      Full           Yes      Yes                                 +---------+---------------+---------+-----------+----------+--------------+ PTV      Full                                                        +---------+---------------+---------+-----------+----------+--------------+ PERO     Full                                                        +---------+---------------+---------+-----------+----------+--------------+  No evidence of lower extremity deep venous thrombosis.   *See table(s) above for measurements and observations. Electronically signed by Monica Martinez MD on 08/01/2021 at 4:42:40 PM.    Final       Assessment and Plan:   Problem List Items Addressed This Visit       Genitourinary   Bartholin cyst - Primary    Reviewed anatomical pictures and discussed likely etiology of her resolved symptoms. 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 Reassured patient that this is not a serious condition but is quite common, and reviewed options of Ward catheter and marsupialization if she has a recurrence.         Other   Pap smear for cervical cancer screening    No pap on file, collected today. Cervix with unusual nodule at 12 o clock, palpated soft and was not friable, no concerning features. Will follow up pap result.       Relevant Orders   Cytology - PAP( [facility])    Routine preventative health maintenance measures emphasized. Please refer to After Visit Summary for other counseling recommendations.   Return if symptoms worsen or fail to improve.    Total face-to-face time with patient: 30 minutes.  Over 50% of encounter was spent on counseling and coordination of care.   Clarnce Flock, MD/MPH Attending Family Medicine Physician, [facility] for [facility], [place]

## 2021-08-19 NOTE — Assessment & Plan Note (Signed)
No pap on file, collected today. Cervix with unusual nodule at 12 o clock, palpated soft and was not friable, no concerning features. Will follow up pap result.

## 2021-08-22 ENCOUNTER — Other Ambulatory Visit (HOSPITAL_COMMUNITY): Payer: Self-pay

## 2021-08-22 LAB — CYTOLOGY - PAP
Comment: NEGATIVE
Diagnosis: NEGATIVE
High risk HPV: NEGATIVE

## 2021-08-22 MED ORDER — TRIAMCINOLONE ACETONIDE 0.1 % EX CREA
TOPICAL_CREAM | CUTANEOUS | 1 refills | Status: DC
Start: 2021-08-22 — End: 2023-06-08
  Filled 2021-08-22: qty 40, 20d supply, fill #0
  Filled 2022-03-13: qty 40, 20d supply, fill #1

## 2021-08-22 MED ORDER — PIOGLITAZONE HCL 15 MG PO TABS
ORAL_TABLET | ORAL | 5 refills | Status: DC
  Filled 2021-08-22: qty 30, 30d supply, fill #0
  Filled 2021-09-05: qty 30, 30d supply, fill #1
  Filled 2021-10-09: qty 30, 30d supply, fill #2
  Filled 2021-11-08: qty 30, 30d supply, fill #3
  Filled 2021-11-25: qty 30, 30d supply, fill #4
  Filled 2022-01-15: qty 30, 30d supply, fill #5

## 2021-08-24 ENCOUNTER — Ambulatory Visit (INDEPENDENT_AMBULATORY_CARE_PROVIDER_SITE_OTHER): Payer: Medicaid Other | Admitting: Orthopaedic Surgery

## 2021-08-24 ENCOUNTER — Encounter: Payer: Self-pay | Admitting: Orthopaedic Surgery

## 2021-08-24 ENCOUNTER — Other Ambulatory Visit: Payer: Self-pay

## 2021-08-24 DIAGNOSIS — G8929 Other chronic pain: Secondary | ICD-10-CM

## 2021-08-24 DIAGNOSIS — M25511 Pain in right shoulder: Secondary | ICD-10-CM

## 2021-08-24 NOTE — Progress Notes (Signed)
HPI: Melody Rodriguez returns today 4 weeks status post right shoulder injection and an intra-articular injection which was performed 7 days later by Dr. Ernestina Patches.  She states she only got 24 hours of some relief with the subacromial injection.  Intra-articular injection gave her no relief.  She stopped physical therapy due to the fact she is making no progress.  However she is continuing with her home exercise program.  She states she still having pain in the shoulder.  Limited range of motion.  States that she is now wearing a compression sleeve on her right arm due to some edema status post surgery for breast cancer.  Review of systems: See HPI.  Physical exam: General well-developed well-nourished female no acute distress. Psych: Alert and oriented x3. Bilateral shoulder she has 5 out of 5 strength with external and internal rotation against resistance.  Empty can test is positive on the right negative on the left.  Unable to perform liftoff exam due to decreased range of motion of the right shoulder.  Active forward flexion right shoulder to approximately 120 degrees passively I can bring her up to approximately 130 degrees..  She has full motor of the right hand.  Sensation grossly intact throughout right  Impression: Chronic right shoulder pain Right frozen shoulder  Plan: We will obtain an MRI of the right shoulder to rule out rotator cuff tear.  Have her follow-up with Dr. [name] after the MRI to go over results and discuss further treatment.  Questions were encouraged and answered

## 2021-08-26 ENCOUNTER — Telehealth: Payer: Self-pay | Admitting: *Deleted

## 2021-08-28 NOTE — Progress Notes (Signed)
SURVIVORSHIP VISIT:   BRIEF ONCOLOGIC HISTORY:  Oncology History  Malignant neoplasm of upper-outer quadrant of right breast in female, estrogen receptor positive ([place])  07/28/2020 Initial Diagnosis   Patient palpated a right breast mass for 1-2 years. Mammogram showed a 2.2cm mass at the 11 o'clock position with surrounding calcifications, 6.4cm in total extent, and up to 5 abnormal right axillary lymph nodes. 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    08/05/2020 Miscellaneous   MammaPrint: High risk luminal type B   08/11/2020 Genetic Testing   Negative genetic testing: no pathogenic variants detected in [facility] Breast Cancer STAT Panel or Common Hereditary Cancers panel. The report dates are August 11, 2020 and August 19, 2020, respectively. Two variants of uncertain signficance were detected - one in the CTNNA1 gene called c.86del and the second in the MLH1 gene called c.808A>G.   UPDATE:  The MLH1 c.808A>G VUS was reclassified to "Likely Benign" on 02/19/2021. The change in variant classification was made as a result of re-review of the evidence in light of new variant interpretation guidelines and/or new information.   The STAT Breast cancer panel offered by [facility] includes sequencing and rearrangement analysis for the following 9 genes:  ATM, BRCA1, BRCA2, CDH1, CHEK2, PALB2, PTEN, STK11 and TP53.  The Common Hereditary Cancers Panel offered by [facility] includes sequencing and/or deletion duplication testing of the following 48 genes: APC, ATM, AXIN2, BARD1, BMPR1A, BRCA1, BRCA2, BRIP1, CDH1, CDK4, CDKN2A (p14ARF), CDKN2A (p16INK4a), CHEK2, CTNNA1, DICER1, EPCAM (Deletion/duplication testing only), GREM1 (promoter region deletion/duplication testing only), KIT, MEN1, MLH1, MSH2, MSH3, MSH6, MUTYH, NBN, NF1, NTHL1, PALB2, PDGFRA, PMS2, POLD1, POLE, PTEN,  RAD50, RAD51C, RAD51D, RNF43, SDHB, SDHC, SDHD, SMAD4, SMARCA4. STK11, TP53, TSC1, TSC2, and VHL.  The following genes were evaluated for sequence changes only: SDHA and HOXB13 c.251G>A variant only.    09/01/2020 - 01/11/2021 Neo-Adjuvant Chemotherapy   Adriamycin and Cytoxan x4 09/01/2020-10/12/2020 Weekly Taxol x 12  10/26/2020-01/11/2021(dose reduced d/t AE)   02/07/2021 Surgery   Right lumpectomy [name]): invasive and in situ ductal carcinoma, 2.8cm, clear margins, with metastatic carcinoma in 1/1 right axillary lymph nodes.   03/28/2021 Surgery   Axillary lymph node dissection: 3/7 lymph nodes +   05/10/2021 - 07/01/2021 Radiation Therapy   Site Technique Total Dose (Gy) Dose per Fx (Gy) Completed Fx Beam Energies  Breast, Right: Breast_Rt 3D 50.4/50.4 1.8 28/28 10X  Breast, Right: Breast_Rt_Bst specialPort 12/12 2 6/6 15E  Sclav-RT: SCV_Rt 3D 50.4/50.4 1.8 28/28      07/2021 -  Anti-estrogen oral therapy   Zoladex + Letrozole + Verzenio     INTERVAL HISTORY:  Ms. Kable to review her survivorship care plan detailing her treatment course for breast cancer, as well as monitoring long-term side effects of that treatment, education regarding health maintenance, screening, and overall wellness and health promotion.     Overall, Ms. Elrod reports feeling moderately well.  She was taking Letrozole that was started in November, however she took a brief holiday due to leg cramping.  During her holiday the leg cramping did not improve.  She also experiences hot flashes.  She continues on Zoladex every 4 weeks.  She has not yet started Verzenio.    Her blood sugars have been challenging and she is working on walking, however her legs are painful.  She also notes a rash that  has occurred.  This began about 2 to 3 days ago.  She says she has not changed any detergents lotions eaten any new foods.  She did restart the letrozole.  She was given a cream to manage this and it is helping  somewhat.  Judeen Hammans is concerned about finances and getting money.  She is struggling financially and did ask for resources about this today.  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  She also is having right shoulder issues and underwent an injection that helped somewhat.  She is scheduled for an MRI on December 30 to further evaluate this area.  REVIEW OF SYSTEMS:  Review of Systems  Constitutional:  Positive for fatigue. Negative for appetite change, chills, fever and unexpected weight change.  HENT:   Negative for hearing loss, lump/mass and trouble swallowing.   Eyes:  Negative for eye problems and icterus.  Respiratory:  Negative for chest tightness, cough and shortness of breath.   Cardiovascular:  Negative for chest pain, leg swelling and palpitations.  Gastrointestinal:  Negative for abdominal distention, abdominal pain, constipation, diarrhea, nausea and vomiting.  Endocrine: Positive for hot flashes.  Genitourinary:  Negative for difficulty urinating.   Musculoskeletal:  Negative for arthralgias.  Skin:  Positive for itching and rash.  Neurological:  Negative for dizziness, extremity weakness, headaches and numbness.  Hematological:  Negative for adenopathy. Does not bruise/bleed easily.  Psychiatric/Behavioral:  Negative for depression. The patient is not nervous/anxious.   Breast: Denies any new nodularity, masses, tenderness, nipple changes, or nipple discharge.    ONCOLOGY TREATMENT TEAM:  1. Surgeon:  Dr. [name] at [facility] 2. Medical Oncologist: Dr. Lindi Adie  3. Radiation Oncologist: Dr. Isidore Moos    PAST MEDICAL/SURGICAL HISTORY:  Past Medical History:  Diagnosis Date   Breast cancer [facility])    Diabetes mellitus without complication ([place])    Family history of colon cancer    History of radiation therapy    right breast/scv  05/10/21-07/01/21  Dr Gery Pray   Past Surgical History:  Procedure  Laterality Date   BREAST BIOPSY Right 02/18/2021   Procedure: EVACUATION HEMATOMA RIGHT AXILLA;  Surgeon: [name], MD;  Location: [place];  Service: General;  Laterality: Right;   BREAST CYST EXCISION Right    Patient does not recall (2014 or 2015)   BREAST LUMPECTOMY WITH RADIOACTIVE SEED AND SENTINEL LYMPH NODE BIOPSY Right 02/07/2021   Procedure: RIGHT BREAST LUMPECTOMY WITH RADIOACTIVE SEED AND SEED TARGETED LYMPH NODE BIOPSY AND SENTINEL LYMPH NODE BIOPSY;  Surgeon: [name], MD;  Location: [place];  Service: General;  Laterality: Right;   CESAREAN SECTION     IR IMAGING GUIDED PORT INSERTION  09/15/2020   IR REMOVAL TUN CV CATH W/O FL  09/15/2020   NODE DISSECTION Right 03/28/2021   Procedure: RIGHT AXILLARY LYMPH NODE DISSECTION;  Surgeon: [name], MD;  Location: [place];  Service: General;  Laterality: Right;   PORT-A-CATH REMOVAL Left 02/07/2021   Procedure: REMOVAL PORT-A-CATH;  Surgeon: [name], MD;  Location: [place];  Service: General;  Laterality: Left;   THYROIDECTOMY, PARTIAL       ALLERGIES:  Allergies  Allergen Reactions   Cephalexin Hives and Rash   Latex Swelling    Itching, swelling   Naproxen Itching     CURRENT MEDICATIONS:  Outpatient Encounter Medications as of 08/29/2021  Medication Sig   acetaminophen (TYLENOL)  325 MG tablet Take by mouth.   atorvastatin (LIPITOR) 10 MG tablet Take one tablet (10 mg dose) by mouth daily.   cyclobenzaprine (FLEXERIL) 5 MG tablet TAKE 1 TABLET BY MOUTH 3 TIMES DAILY AS NEEDED FOR MUSCLE SPASMS.   glipiZIDE (GLUCOTROL) 5 MG tablet Take one tablet (5 mg dose) by mouth 2 (two) times daily.   letrozole (FEMARA) 2.5 MG tablet Take 1 tablet (2.5 mg total) by mouth daily.   pioglitazone (ACTOS) 15 MG tablet Take one tablet (15 mg dose) by mouth daily.   fexofenadine (ALLEGRA) 180 MG tablet Take by mouth. (Patient not taking:  Reported on 08/29/2021)   fluticasone (FLONASE) 50 MCG/ACT nasal spray 2 sprays by Each Nare route daily for 30 days. (Patient not taking: Reported on 08/29/2021)   loratadine (CLARITIN) 10 MG tablet Take 10 mg by mouth daily. (Patient not taking: Reported on 08/29/2021)   oxyCODONE (OXY IR/ROXICODONE) 5 MG immediate release tablet Take 1-2 tablets (5-10 mg total) by mouth every 6 (six) hours as needed for moderate pain, severe pain or breakthrough pain. (Patient not taking: Reported on 08/29/2021)   triamcinolone cream (KENALOG) 0.1 % Apply topically 2 (two) times a day as needed. (Patient not taking: Reported on 08/29/2021)   venlafaxine XR (EFFEXOR-XR) 37.5 MG 24 hr capsule TAKE 1 CAPSULE BY MOUTH DAILY WITH BREAKFAST (Patient not taking: Reported on 08/29/2021)   No facility-administered encounter medications on file as of 08/29/2021.     ONCOLOGIC FAMILY HISTORY:  Family History  Problem Relation Age of Onset   Hypertension Mother    Colon cancer Sister 36   Diabetes Maternal Grandmother    Cancer Maternal Aunt        unknown type dx late 10s     GENETIC COUNSELING/TESTING: See above  SOCIAL HISTORY:  Social History   Socioeconomic History   Marital status: Single    Spouse name: Not on file   Number of children: 2   Years of education: Not on file   Highest education level: Some college, no degree  Occupational History   Not on file  Tobacco Use   Smoking status: Former    Packs/day: 1.00    Types: Cigars, Cigarettes   Smokeless tobacco: Never   Tobacco comments:    quit 07/2020  Vaping Use   Vaping Use: Never used  Substance and Sexual Activity   Alcohol use: Yes    Comment: occas   Drug use: Yes    Types: Marijuana    Comment: chemo/ pain mgmt   Sexual activity: Not Currently  Other Topics Concern   Not on file  Social History Narrative   Not on file   Social Determinants of Health   Financial Resource Strain: High Risk   Difficulty of Paying Living  Expenses: Hard  Food Insecurity: Food Insecurity Present   Worried About Running Out of Food in the Last Year: Sometimes true   Ran Out of Food in the Last Year: Sometimes true  Transportation Needs: No Transportation Needs   Lack of Transportation (Medical): No   Lack of Transportation (Non-Medical): No  Physical Activity: Inactive   Days of Exercise per Week: 0 days   Minutes of Exercise per Session: 0 min  Stress: Stress Concern Present   Feeling of Stress : To some extent  Social Connections: Socially Isolated   Frequency of Communication with Friends and Family: Three times a week   Frequency of Social Gatherings with Friends and Family: More than three times  a week   Attends Religious Services: Never   Active Member of Clubs or Organizations: No   Attends Archivist Meetings: Never   Marital Status: Never married  Human resources officer Violence: Not At Risk   Fear of Current or Ex-Partner: No   Emotionally Abused: No   Physically Abused: No   Sexually Abused: No     OBSERVATIONS/OBJECTIVE:  BP 140/76 (BP Location: Left Arm, Patient Position: Sitting)   Pulse 73   Temp (!) 97.3 F (36.3 C) (Temporal)   Resp 17   Wt 208 lb 11.2 oz (94.7 kg)   SpO2 96%   BMI 35.82 kg/m  GENERAL: Patient is a well appearing female in no acute distress HEENT:  Sclerae anicteric.  Oropharynx clear and moist. No ulcerations or evidence of oropharyngeal candidiasis. Neck is supple.  NODES:  No cervical, supraclavicular, or axillary lymphadenopathy palpated.  BREAST EXAM: Right breast is status postlumpectomy and radiation there is no sign of local recurrence left breast is benign. LUNGS:  Clear to auscultation bilaterally.  No wheezes or rhonchi. HEART:  Regular rate and rhythm. No murmur appreciated. ABDOMEN:  Soft, nontender.  Positive, normoactive bowel sounds. No organomegaly palpated. MSK:  No focal spinal tenderness to palpation. Full range of motion bilaterally in the upper  extremities. EXTREMITIES:  No peripheral edema.   SKIN:  Clear with no obvious rashes or skin changes. No nail dyscrasia. NEURO:  Nonfocal. Well oriented.  Appropriate affect.   LABORATORY DATA:  None for this visit.  DIAGNOSTIC IMAGING:  None for this visit.      ASSESSMENT AND PLAN:  Ms.. Trefz is a pleasant 45 y.o. 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   She presents to the Survivorship Clinic for our initial meeting and routine follow-up post-completion of treatment for breast cancer.    1. Stage IIA right/left breast cancer:  Ms. Moxley is continuing to recover from definitive treatment for breast cancer. She will follow-up with her medical oncologist, Dr. Lindi Adie in 4 weeks with history and physical exam per surveillance protocol.  She will continue her anti-estrogen therapy with letrozole.  Please see #2 rash.  She has not yet started [facility] and she will return in 4 weeks for labs and follow-up with Dr. Payton Mccallum to discuss this further. Her mammogram is due 12/2021; orders placed today. Today, a comprehensive survivorship care plan and treatment summary was reviewed with the patient today detailing her breast cancer diagnosis, treatment course, potential late/long-term effects of treatment, appropriate follow-up care with recommendations for the future, and patient education resources.  A copy of this summary, along with a letter will be sent to the patient's primary care provider via mail/fax/In Basket message after today's visit.    2. Rash: This could very well be related to the letrozole.  I suggested that she stop taking the letrozole to see if the rash went away.  She told me she was  not comfortable with going on and off the letrozole so many times and she preferred to stay on it.  I discussed with her that there are other antiestrogen pills that she can take that may not have the same side effects.  She agreed to call if the rash worsened or persisted and did not improve.  3.  Financial difficulty: I  have placed a referral for social worker.  I also gave her a handout in her survivorship folder that details financial resources.  4. Bone health:  Given Ms. Kohls's age/history of breast cancer and her current treatment regimen including anti-estrogen therapy with Letrozole, she is at risk for bone demineralization.  She very has not underwent bone density testing and I placed orders for this today.Marland Kitchen  She was given education on specific activities to promote bone health.  5. Cancer screening:  Due to Ms. Staniszewski's history and her age, she should receive screening for skin cancers, colon cancer, and gynecologic cancers.  Judeen Hammans notes she recently underwent a Pap smear.  She was scheduled to have a colonoscopy referral, however she was told that [facility] did not except her type of Medicaid.  We also discussed that since she does have a 20-pack-year tobacco history and quit 1 year ago that when she turns 11 she will begin her eligibility for lung cancer screening.  6. Health maintenance and wellness promotion: Ms. Lux was encouraged to consume 5-7 servings of fruits and vegetables per day. We reviewed the "Nutrition Rainbow" handout, as well as the handout "Take Control of Your Health and Reduce Your Cancer Risk" from the [employer].  She was also encouraged to engage in moderate to vigorous exercise for 30 minutes per day most days of the week. We discussed the [employer] fitness program, which is designed for cancer survivors to help them become more physically fit after cancer treatments.  She was instructed to limit her alcohol consumption and continue to abstain  from tobacco use.     7. Support services/counseling: It is not uncommon for this period of the patient's cancer care trajectory to be one of many emotions and stressors.   She was given information regarding our available services and encouraged to contact me with any questions or for help enrolling in any of our support group/programs.     Follow up instructions:    -Return to cancer center in 4 weeks for labs follow-up and injection -Mammogram due in 12/2021 -Bone Density due -Follow up with surgery per their recommendation -She is welcome to return back to the Survivorship Clinic at any time; no additional follow-up needed at this time.  -Consider referral back to survivorship as a long-term survivor for continued surveillance  The patient was provided an opportunity to ask questions and all were answered. The patient agreed with the plan and demonstrated an understanding of the instructions.   Total encounter time: 60 minutes in face-to-face visit time, chart review, lab review, care coordination, order entry, and documentation of the encounter.  Wilber Bihari, NP 08/29/21 9:10 AM Medical Oncology and [facility] [place], [place] [address] Tel. [phone]    Fax. [phone]  *Total Encounter Time as defined by the Centers for Medicare and Medicaid Services includes, in addition to the face-to-face time of a patient visit (documented in the note above) non-face-to-face time: obtaining and reviewing outside history, ordering and reviewing medications, tests or procedures, care coordination (communications with other health care professionals or caregivers) and documentation in the medical record.

## 2021-08-29 ENCOUNTER — Inpatient Hospital Stay (HOSPITAL_BASED_OUTPATIENT_CLINIC_OR_DEPARTMENT_OTHER): Payer: Medicaid Other | Admitting: Adult Health

## 2021-08-29 ENCOUNTER — Encounter: Payer: Self-pay | Admitting: Adult Health

## 2021-08-29 ENCOUNTER — Other Ambulatory Visit: Payer: Self-pay

## 2021-08-29 ENCOUNTER — Ambulatory Visit: Payer: Medicaid Other

## 2021-08-29 ENCOUNTER — Inpatient Hospital Stay: Payer: Medicaid Other

## 2021-08-29 VITALS — BP 140/76 | HR 73 | Temp 97.3°F | Resp 17 | Wt 208.7 lb

## 2021-08-29 DIAGNOSIS — Z800 Family history of malignant neoplasm of digestive organs: Secondary | ICD-10-CM | POA: Diagnosis not present

## 2021-08-29 DIAGNOSIS — C773 Secondary and unspecified malignant neoplasm of axilla and upper limb lymph nodes: Secondary | ICD-10-CM | POA: Diagnosis present

## 2021-08-29 DIAGNOSIS — Z79899 Other long term (current) drug therapy: Secondary | ICD-10-CM | POA: Diagnosis not present

## 2021-08-29 DIAGNOSIS — E2839 Other primary ovarian failure: Secondary | ICD-10-CM

## 2021-08-29 DIAGNOSIS — E119 Type 2 diabetes mellitus without complications: Secondary | ICD-10-CM | POA: Diagnosis not present

## 2021-08-29 DIAGNOSIS — Z170 Estrogen receptor positive status [ER+]: Secondary | ICD-10-CM

## 2021-08-29 DIAGNOSIS — C50411 Malignant neoplasm of upper-outer quadrant of right female breast: Secondary | ICD-10-CM

## 2021-08-29 DIAGNOSIS — Z7984 Long term (current) use of oral hypoglycemic drugs: Secondary | ICD-10-CM | POA: Diagnosis not present

## 2021-08-29 DIAGNOSIS — Z923 Personal history of irradiation: Secondary | ICD-10-CM | POA: Diagnosis not present

## 2021-08-29 DIAGNOSIS — Z5111 Encounter for antineoplastic chemotherapy: Secondary | ICD-10-CM | POA: Diagnosis not present

## 2021-08-29 DIAGNOSIS — Z79811 Long term (current) use of aromatase inhibitors: Secondary | ICD-10-CM | POA: Diagnosis not present

## 2021-08-29 MED ORDER — GOSERELIN ACETATE 3.6 MG ~~LOC~~ IMPL
3.6000 mg | DRUG_IMPLANT | Freq: Once | SUBCUTANEOUS | Status: AC
  Administered 2021-08-29: 3.6 mg via SUBCUTANEOUS
  Filled 2021-08-29: qty 3.6

## 2021-08-29 NOTE — Patient Instructions (Signed)
Goserelin injection °What is this medication? °GOSERELIN (GOE se rel in) is similar to a hormone found in the body. It lowers the amount of sex hormones that the body makes. Men will have lower testosterone levels and women will have lower estrogen levels while taking this medicine. In men, this medicine is used to treat prostate cancer; the injection is either given once per month or once every 12 weeks. A once per month injection (only) is used to treat women with endometriosis, dysfunctional uterine bleeding, or advanced breast cancer. °This medicine may be used for other purposes; ask your health care provider or pharmacist if you have questions. °COMMON BRAND NAME(S): Zoladex, Zoladex 3-Month °What should I tell my care team before I take this medication? °They need to know if you have any of these conditions: °bone problems °diabetes °heart disease °history of irregular heartbeat °an unusual or allergic reaction to goserelin, other medicines, foods, dyes, or preservatives °pregnant or trying to get pregnant °breast-feeding °How should I use this medication? °This medicine is for injection under the skin. It is given by a health care professional in a hospital or clinic setting. °Talk to your pediatrician regarding the use of this medicine in children. Special care may be needed. °Overdosage: If you think you have taken too much of this medicine contact a poison control center or emergency room at once. °NOTE: This medicine is only for you. Do not share this medicine with others. °What if I miss a dose? °It is important not to miss your dose. Call your doctor or health care professional if you are unable to keep an appointment. °What may interact with this medication? °Do not take this medicine with any of the following medications: °cisapride °dronedarone °pimozide °thioridazine °This medicine may also interact with the following medications: °other medicines that prolong the QT interval (an abnormal heart  rhythm) °This list may not describe all possible interactions. Give your health care provider a list of all the medicines, herbs, non-prescription drugs, or dietary supplements you use. Also tell them if you smoke, drink alcohol, or use illegal drugs. Some items may interact with your medicine. °What should I watch for while using this medication? °Visit your doctor or health care provider for regular checks on your progress. Your symptoms may appear to get worse during the first weeks of this therapy. Tell your doctor or healthcare provider if your symptoms do not start to get better or if they get worse after this time. °Your bones may get weaker if you take this medicine for a long time. If you smoke or frequently drink alcohol you may increase your risk of bone loss. A family history of osteoporosis, chronic use of drugs for seizures (convulsions), or corticosteroids can also increase your risk of bone loss. Talk to your doctor about how to keep your bones strong. °This medicine should stop regular monthly menstruation in women. Tell your doctor if you continue to menstruate. °Women should not become pregnant while taking this medicine or for 12 weeks after stopping this medicine. Women should inform their doctor if they wish to become pregnant or think they might be pregnant. There is a potential for serious side effects to an unborn child. Talk to your health care professional or pharmacist for more information. Do not breast-feed an infant while taking this medicine. °Men should inform their doctors if they wish to father a child. This medicine may lower sperm counts. Talk to your health care professional or pharmacist for more information. °This   medicine may increase blood sugar. Ask your healthcare provider if changes in diet or medicines are needed if you have diabetes. °What side effects may I notice from receiving this medication? °Side effects that you should report to your doctor or health care  professional as soon as possible: °allergic reactions like skin rash, itching or hives, swelling of the face, lips, or tongue °bone pain °breathing problems °changes in vision °chest pain °feeling faint or lightheaded, falls °fever, chills °pain, swelling, warmth in the leg °pain, tingling, numbness in the hands or feet °signs and symptoms of high blood sugar such as being more thirsty or hungry or having to urinate more than normal. You may also feel very tired or have blurry vision °signs and symptoms of low blood pressure like dizziness; feeling faint or lightheaded, falls; unusually weak or tired °stomach pain °swelling of the ankles, feet, hands °trouble passing urine or change in the amount of urine °unusually high or low blood pressure °unusually weak or tired °Side effects that usually do not require medical attention (report to your doctor or health care professional if they continue or are bothersome): °change in sex drive or performance °changes in breast size in both males and females °changes in emotions or moods °headache °hot flashes °irritation at site where injected °loss of appetite °skin problems like acne, dry skin °vaginal dryness °This list may not describe all possible side effects. Call your doctor for medical advice about side effects. You may report side effects to FDA at 1-800-FDA-1088. °Where should I keep my medication? °This drug is given in a hospital or clinic and will not be stored at home. °NOTE: This sheet is a summary. It may not cover all possible information. If you have questions about this medicine, talk to your doctor, pharmacist, or health care provider. °© 2022 [employer] (2019-01-03 00:00:00) ° °

## 2021-08-30 ENCOUNTER — Other Ambulatory Visit: Payer: Self-pay

## 2021-08-30 DIAGNOSIS — C50411 Malignant neoplasm of upper-outer quadrant of right female breast: Secondary | ICD-10-CM

## 2021-08-30 NOTE — Progress Notes (Signed)
Called pt insurance to locate GI provider covered by insurance - [facility].  Staff message sent via inbasket system to [facility] for processing of this referral.

## 2021-08-31 ENCOUNTER — Ambulatory Visit (HOSPITAL_BASED_OUTPATIENT_CLINIC_OR_DEPARTMENT_OTHER)
Admission: RE | Admit: 2021-08-31 | Discharge: 2021-08-31 | Disposition: A | Discharge status: Home / Self Care | Payer: Medicaid Other | Source: Ambulatory Visit | Attending: Adult Health | Admitting: Adult Health

## 2021-08-31 ENCOUNTER — Other Ambulatory Visit: Payer: Self-pay

## 2021-08-31 ENCOUNTER — Other Ambulatory Visit (HOSPITAL_COMMUNITY): Payer: Self-pay

## 2021-08-31 ENCOUNTER — Telehealth: Payer: Self-pay | Admitting: Licensed Clinical Social Worker

## 2021-08-31 DIAGNOSIS — C50411 Malignant neoplasm of upper-outer quadrant of right female breast: Secondary | ICD-10-CM | POA: Diagnosis present

## 2021-08-31 DIAGNOSIS — E2839 Other primary ovarian failure: Secondary | ICD-10-CM | POA: Diagnosis present

## 2021-08-31 DIAGNOSIS — Z170 Estrogen receptor positive status [ER+]: Secondary | ICD-10-CM | POA: Diagnosis present

## 2021-08-31 NOTE — Telephone Encounter (Signed)
[place] [employer]  Holiday representative  attempted to contact patient by phone  to address referral for financial difficulties. No answer. Left VM with direct contact information.    Christeen Douglas , LCSW

## 2021-09-02 ENCOUNTER — Telehealth: Payer: Self-pay

## 2021-09-02 ENCOUNTER — Encounter: Payer: Self-pay | Admitting: Licensed Clinical Social Worker

## 2021-09-02 NOTE — Progress Notes (Signed)
[facility] CSW Progress Note  Holiday representative  spoke with patient by phone  to assess for current psychosocial needs. Patient reports that it has been a struggle with physical recovery since finishing treatment which has prevented her from returning to work at this time leading to financial stress. CSW reviewed options for potential assistance through [employer] and [employer]. Completed and submitted referral for CSNP (food program) through [employer] and application for [facility] with patient's consent. CSNP and [employer] will contact pt directly regarding assistance.    Christeen Douglas , LCSW

## 2021-09-02 NOTE — Telephone Encounter (Signed)
Attempt to call pt x 2, to share results, no VM available

## 2021-09-04 ENCOUNTER — Encounter (HOSPITAL_COMMUNITY): Payer: Self-pay

## 2021-09-04 ENCOUNTER — Other Ambulatory Visit: Payer: Self-pay

## 2021-09-04 ENCOUNTER — Ambulatory Visit (HOSPITAL_COMMUNITY)
Admission: EM | Admit: 2021-09-04 | Discharge: 2021-09-04 | Disposition: A | Discharge status: Home / Self Care | Payer: Medicaid Other | Attending: Student | Admitting: Student

## 2021-09-04 DIAGNOSIS — E118 Type 2 diabetes mellitus with unspecified complications: Secondary | ICD-10-CM | POA: Diagnosis not present

## 2021-09-04 DIAGNOSIS — J09X2 Influenza due to identified novel influenza A virus with other respiratory manifestations: Secondary | ICD-10-CM | POA: Diagnosis not present

## 2021-09-04 DIAGNOSIS — Z7984 Long term (current) use of oral hypoglycemic drugs: Secondary | ICD-10-CM

## 2021-09-04 DIAGNOSIS — E119 Type 2 diabetes mellitus without complications: Secondary | ICD-10-CM

## 2021-09-04 DIAGNOSIS — E1169 Type 2 diabetes mellitus with other specified complication: Secondary | ICD-10-CM

## 2021-09-04 LAB — POC INFLUENZA A AND B ANTIGEN (URGENT CARE ONLY)
INFLUENZA A ANTIGEN, POC: POSITIVE — AB
INFLUENZA B ANTIGEN, POC: NEGATIVE

## 2021-09-04 MED ORDER — BENZONATATE 100 MG PO CAPS: 100.0000 mg | ORAL_CAPSULE | Freq: Three times a day (TID) | ORAL | 0 refills | Status: DC

## 2021-09-04 MED ORDER — PROMETHAZINE-DM 6.25-15 MG/5ML PO SYRP: 5.0000 mL | ORAL_SOLUTION | Freq: Four times a day (QID) | ORAL | 0 refills | Status: DC | PRN

## 2021-09-04 MED ORDER — OSELTAMIVIR PHOSPHATE 75 MG PO CAPS: 75.0000 mg | ORAL_CAPSULE | Freq: Two times a day (BID) | ORAL | 0 refills | Status: DC

## 2021-09-04 MED ORDER — ONDANSETRON 8 MG PO TBDP: 8.0000 mg | ORAL_TABLET | Freq: Three times a day (TID) | ORAL | 0 refills | Status: DC | PRN

## 2021-09-04 NOTE — Discharge Instructions (Signed)
-  Tamiflu twice daily x5 days. This medication can cause nausea, so I also sent nausea medication. You can stop the Tamiflu if you don't like it or if it causes side effects.  -Take the Zofran (ondansetron) up to 3 times daily for nausea and vomiting. -Tessalon (Benzonatate) as needed for cough. Take one pill up to 3x daily (every 8 hours) -Promethazine DM cough syrup for congestion/cough. This could make you drowsy, so take at night before bed. -You can also take over-the-counter medications like Mucinex during the day -For fevers/chills, bodyaches, headaches- You can take Tylenol up to 1000 mg 3 times daily, and ibuprofen up to 600 mg 3 times daily with food.  You can take these together, or alternate every 3-4 hours. -Drink plenty of water/gatorade and get plenty of rest -With a virus, you're typically contagious for 5-7 days, or as long as you're having fevers.  -Come back and see [place] if things are getting worse instead of better, like shortness of breath, chest pain, fevers and chills that are getting higher instead of lower and do not come down with Tylenol or ibuprofen, etc.

## 2021-09-04 NOTE — ED Provider Notes (Signed)
[name]    CSN: [id number] Arrival date & time: 09/04/21  1541      History   Chief Complaint Chief Complaint  Patient presents with   Fever   Cough    Congestion and body aches x 2-3 days.    HPI Melody Rodriguez is a 45 y.o. female presenting with the flu.  Medical history diabetes.  Describes 2 days of congestion, body aches, malaise, fatigue, decreased appetite, cough.  Has been taking some over-the-counter Robitussin without relief.  Has not monitored her temperature at home.  She is a newly diagnosed type II diabetic, nonfasting sugars this morning were 165.  Denies shortness of breath, chest pain, dizziness, weakness.  Tolerating fluids and food.  States that she did not plan to seek medical attention, but her sister encouraged her to.  Her son is also sick, he is getting better already.  HPI  Past Medical History:  Diagnosis Date   Breast cancer ([place])    Diabetes mellitus without complication ([place])    Family history of colon cancer    History of radiation therapy    right breast/scv  05/10/21-07/01/21  Dr Gery Pray    Patient Active Problem List   Diagnosis Date Noted   Pap smear for cervical cancer screening 08/19/2021   Bartholin cyst 05/06/2021   Amenorrhea, secondary 03/02/2021   PICC (peripherally inserted central catheter) in place 09/09/2020   Genetic testing 08/11/2020   Iron deficiency anemia due to chronic blood loss 08/04/2020   Family history of colon cancer    Malignant neoplasm of upper-outer quadrant of right breast in female, estrogen receptor positive ([place]) 07/28/2020    Past Surgical History:  Procedure Laterality Date   BREAST BIOPSY Right 02/18/2021   Procedure: EVACUATION HEMATOMA RIGHT AXILLA;  Surgeon: [name], MD;  Location: [facility];  Service: General;  Laterality: Right;   BREAST CYST EXCISION Right    Patient does not recall (2014 or 2015)   BREAST LUMPECTOMY WITH RADIOACTIVE SEED AND SENTINEL  LYMPH NODE BIOPSY Right 02/07/2021   Procedure: RIGHT BREAST LUMPECTOMY WITH RADIOACTIVE SEED AND SEED TARGETED LYMPH NODE BIOPSY AND SENTINEL LYMPH NODE BIOPSY;  Surgeon: [name], MD;  Location: [facility];  Service: General;  Laterality: Right;   CESAREAN SECTION     IR IMAGING GUIDED PORT INSERTION  09/15/2020   IR REMOVAL TUN CV CATH W/O FL  09/15/2020   NODE DISSECTION Right 03/28/2021   Procedure: RIGHT AXILLARY LYMPH NODE DISSECTION;  Surgeon: [name], MD;  Location: [facility];  Service: General;  Laterality: Right;   PORT-A-CATH REMOVAL Left 02/07/2021   Procedure: REMOVAL PORT-A-CATH;  Surgeon: [name], MD;  Location: [facility];  Service: General;  Laterality: Left;   THYROIDECTOMY, PARTIAL      OB History     Gravida  2   Para      Term      Preterm      AB      Living  2      SAB      IAB      Ectopic      Multiple      Live Births  2            Home Medications    Prior to Admission medications   Medication Sig Start Date End Date Taking? Authorizing Provider  benzonatate (TESSALON) 100 MG capsule Take 1 capsule (100 mg total) by  mouth every 8 (eight) hours. 09/04/21  Yes Hazel Sams, PA-C  ondansetron (ZOFRAN-ODT) 8 MG disintegrating tablet Take 1 tablet (8 mg total) by mouth every 8 (eight) hours as needed for nausea or vomiting. 09/04/21  Yes Hazel Sams, PA-C  oseltamivir (TAMIFLU) 75 MG capsule Take 1 capsule (75 mg total) by mouth every 12 (twelve) hours. 09/04/21  Yes Hazel Sams, PA-C  promethazine-dextromethorphan (PROMETHAZINE-DM) 6.25-15 MG/5ML syrup Take 5 mLs by mouth 4 (four) times daily as needed for cough. 09/04/21  Yes Hazel Sams, PA-C  acetaminophen (TYLENOL) 325 MG tablet Take by mouth.    [provider]  atorvastatin (LIPITOR) 10 MG tablet Take one tablet (10 mg dose) by mouth daily. 08/15/21     cyclobenzaprine (FLEXERIL) 5 MG  tablet TAKE 1 TABLET BY MOUTH 3 TIMES DAILY AS NEEDED FOR MUSCLE SPASMS. 08/03/21 08/03/22  Nicholas Lose, MD  fexofenadine (ALLEGRA) 180 MG tablet Take by mouth. Patient not taking: Reported on 08/29/2021 01/13/20   [provider]  fluticasone (FLONASE) 50 MCG/ACT nasal spray 2 sprays by Each Nare route daily for 30 days. Patient not taking: Reported on 08/29/2021 01/13/20   [provider]  glipiZIDE (GLUCOTROL) 5 MG tablet Take one tablet (5 mg dose) by mouth 2 (two) times daily. 08/15/21     letrozole (FEMARA) 2.5 MG tablet Take 1 tablet (2.5 mg total) by mouth daily. 07/19/21   Nicholas Lose, MD  loratadine (CLARITIN) 10 MG tablet Take 10 mg by mouth daily. Patient not taking: Reported on 08/29/2021 09/01/20   [provider]  oxyCODONE (OXY IR/ROXICODONE) 5 MG immediate release tablet Take 1-2 tablets (5-10 mg total) by mouth every 6 (six) hours as needed for moderate pain, severe pain or breakthrough pain. Patient not taking: Reported on 08/29/2021 08/04/21   Gery Pray, MD  pioglitazone (ACTOS) 15 MG tablet Take one tablet (15 mg dose) by mouth daily. 08/22/21     triamcinolone cream (KENALOG) 0.1 % Apply topically 2 (two) times a day as needed. Patient not taking: Reported on 08/29/2021 08/22/21     venlafaxine XR (EFFEXOR-XR) 37.5 MG 24 hr capsule TAKE 1 CAPSULE BY MOUTH DAILY WITH BREAKFAST Patient not taking: Reported on 08/29/2021 09/28/20 09/28/21  Nicholas Lose, MD    Family History Family History  Problem Relation Age of Onset   Hypertension Mother    Colon cancer Sister 83   Diabetes Maternal Grandmother    Cancer Maternal Aunt        unknown type dx late 56s    Social History Social History   Tobacco Use   Smoking status: Former    Packs/day: 1.00    Years: 22.00    Pack years: 22.00    Types: Cigars, Cigarettes   Smokeless tobacco: Never   Tobacco comments:    quit 07/2020  Vaping Use   Vaping Use: Never used  Substance Use Topics    Alcohol use: Yes    Comment: occas   Drug use: Yes    Types: Marijuana    Comment: chemo/ pain mgmt     Allergies   Cephalexin, Latex, and Naproxen   Review of Systems Review of Systems  Constitutional:  Negative for appetite change, chills and fever.  HENT:  Positive for congestion. Negative for ear pain, rhinorrhea, sinus pressure, sinus pain and sore throat.   Eyes:  Negative for redness and visual disturbance.  Respiratory:  Positive for cough. Negative for chest tightness, shortness of breath and wheezing.  Cardiovascular:  Negative for chest pain and palpitations.  Gastrointestinal:  Negative for abdominal pain, constipation, diarrhea, nausea and vomiting.  Genitourinary:  Negative for dysuria, frequency and urgency.  Musculoskeletal:  Negative for myalgias.  Neurological:  Negative for dizziness, weakness and headaches.  Psychiatric/Behavioral:  Negative for confusion.   All other systems reviewed and are negative.   Physical Exam Triage Vital Signs ED Triage Vitals  Enc Vitals Group     BP 09/04/21 1629 116/72     Pulse Rate 09/04/21 1629 87     Resp 09/04/21 1629 16     Temp 09/04/21 1629 99.2 F (37.3 C)     Temp Source 09/04/21 1629 Oral     SpO2 09/04/21 1629 100 %     Weight --      Height --      Head Circumference --      Peak Flow --      Pain Score 09/04/21 1630 0     Pain Loc --      Pain Edu? --      Excl. in [place]? --    No data found.  Updated Vital Signs BP 116/72 (BP Location: Left Arm)    Pulse 87    Temp 99.2 F (37.3 C) (Oral)    Resp 16    SpO2 100%   Visual Acuity Right Eye Distance:   Left Eye Distance:   Bilateral Distance:    Right Eye Near:   Left Eye Near:    Bilateral Near:     Physical Exam Vitals reviewed.  Constitutional:      General: She is not in acute distress.    Appearance: Normal appearance. She is ill-appearing.  HENT:     Head: Normocephalic and atraumatic.     Right Ear: Tympanic membrane, ear canal  and external ear normal. No tenderness. No middle ear effusion. There is no impacted cerumen. Tympanic membrane is not perforated, erythematous, retracted or bulging.     Left Ear: Tympanic membrane, ear canal and external ear normal. No tenderness.  No middle ear effusion. There is no impacted cerumen. Tympanic membrane is not perforated, erythematous, retracted or bulging.     Nose: Nose normal. No congestion.     Mouth/Throat:     Mouth: Mucous membranes are moist.     Pharynx: Uvula midline. No oropharyngeal exudate or posterior oropharyngeal erythema.  Eyes:     Extraocular Movements: Extraocular movements intact.     Pupils: Pupils are equal, round, and reactive to light.  Cardiovascular:     Rate and Rhythm: Normal rate and regular rhythm.     Heart sounds: Normal heart sounds.  Pulmonary:     Effort: Pulmonary effort is normal.     Breath sounds: Normal breath sounds. No decreased breath sounds, wheezing, rhonchi or rales.  Abdominal:     Palpations: Abdomen is soft.     Tenderness: There is no abdominal tenderness. There is no guarding or rebound.  Lymphadenopathy:     Cervical: No cervical adenopathy.     Right cervical: No superficial cervical adenopathy.    Left cervical: No superficial cervical adenopathy.  Neurological:     General: No focal deficit present.     Mental Status: She is alert and oriented to person, place, and time.  Psychiatric:        Mood and Affect: Mood normal.        Behavior: Behavior normal.        Thought Content:  Thought content normal.        Judgment: Judgment normal.     UC Treatments / Results  Labs (all labs ordered are listed, but only abnormal results are displayed) Labs Reviewed  POC INFLUENZA A AND B ANTIGEN (URGENT CARE ONLY) - Abnormal; Notable for the following components:      Result Value   INFLUENZA A ANTIGEN, POC POSITIVE (*)    All other components within normal limits    EKG   Radiology No results  found.  Procedures Procedures (including critical care time)  Medications Ordered in UC Medications - No data to display  Initial Impression / Assessment and Plan / UC Course  I have reviewed the triage vital signs and the nursing notes.  Pertinent labs & imaging results that were available during my care of the patient were reviewed by me and considered in my medical decision making (see chart for details).     This patient is a very pleasant 45 y.o. year old female presenting with influenza A. Today this pt is afebrile nontachycardic nontachypneic, oxygenating well on room air, no wheezes rhonchi or rales.   Rapid influenza A positive.  She is in the Tamiflu window.  Sent as below.  Also sent Zofran, Tessalon, Promethazine DM.    She is a type II diabetic, sugars running 160s at home.  Continue to monitor this.  Continue current regimen.  ED return precautions discussed. Patient verbalizes understanding and agreement.   .   Final Clinical Impressions(s) / UC Diagnoses   Final diagnoses:  Influenza due to identified novel influenza A virus with other respiratory manifestations  Diabetes mellitus treated with oral medication ([place])  Type 2 diabetes mellitus with other specified complication, without long-term current use of insulin ([place])     Discharge Instructions      -Tamiflu twice daily x5 days. This medication can cause nausea, so I also sent nausea medication. You can stop the Tamiflu if you don't like it or if it causes side effects.  -Take the Zofran (ondansetron) up to 3 times daily for nausea and vomiting. -Tessalon (Benzonatate) as needed for cough. Take one pill up to 3x daily (every 8 hours) -Promethazine DM cough syrup for congestion/cough. This could make you drowsy, so take at night before bed. -You can also take over-the-counter medications like Mucinex during the day -For fevers/chills, bodyaches, headaches- You can take Tylenol up to 1000 mg 3 times daily,  and ibuprofen up to 600 mg 3 times daily with food.  You can take these together, or alternate every 3-4 hours. -Drink plenty of water/gatorade and get plenty of rest -With a virus, you're typically contagious for 5-7 days, or as long as you're having fevers.  -Come back and see [place] if things are getting worse instead of better, like shortness of breath, chest pain, fevers and chills that are getting higher instead of lower and do not come down with Tylenol or ibuprofen, etc.    ED Prescriptions     Medication Sig Dispense Auth. Provider   oseltamivir (TAMIFLU) 75 MG capsule Take 1 capsule (75 mg total) by mouth every 12 (twelve) hours. 10 capsule Marin Roberts E, PA-C   ondansetron (ZOFRAN-ODT) 8 MG disintegrating tablet Take 1 tablet (8 mg total) by mouth every 8 (eight) hours as needed for nausea or vomiting. 20 tablet Hazel Sams, PA-C   benzonatate (TESSALON) 100 MG capsule Take 1 capsule (100 mg total) by mouth every 8 (eight) hours. 21 capsule Phillip Heal,  Sherlon Handing, PA-C   promethazine-dextromethorphan (PROMETHAZINE-DM) 6.25-15 MG/5ML syrup Take 5 mLs by mouth 4 (four) times daily as needed for cough. 118 mL Hazel Sams, PA-C      PDMP not reviewed this encounter.   Hazel Sams, PA-C 09/04/21 1745

## 2021-09-05 ENCOUNTER — Other Ambulatory Visit (HOSPITAL_COMMUNITY): Payer: Self-pay

## 2021-09-05 MED ORDER — GLIPIZIDE 5 MG PO TABS
ORAL_TABLET | ORAL | 3 refills | Status: DC
  Filled 2021-09-05: qty 60, 30d supply, fill #0
  Filled 2021-10-09: qty 60, 30d supply, fill #1

## 2021-09-06 ENCOUNTER — Other Ambulatory Visit (HOSPITAL_COMMUNITY): Payer: Self-pay

## 2021-09-13 ENCOUNTER — Other Ambulatory Visit (HOSPITAL_COMMUNITY): Payer: Self-pay

## 2021-09-14 ENCOUNTER — Ambulatory Visit: Payer: Medicaid Other | Admitting: Nurse Practitioner

## 2021-09-16 ENCOUNTER — Inpatient Hospital Stay: Payer: Medicaid Other

## 2021-09-16 ENCOUNTER — Ambulatory Visit
Admission: RE | Admit: 2021-09-16 | Discharge: 2021-09-16 | Disposition: A | Discharge status: Home / Self Care | Payer: Medicaid Other | Source: Ambulatory Visit | Attending: Orthopaedic Surgery | Admitting: Orthopaedic Surgery

## 2021-09-16 ENCOUNTER — Encounter: Payer: Self-pay | Admitting: Adult Health

## 2021-09-16 ENCOUNTER — Other Ambulatory Visit: Payer: Self-pay

## 2021-09-16 ENCOUNTER — Inpatient Hospital Stay (HOSPITAL_BASED_OUTPATIENT_CLINIC_OR_DEPARTMENT_OTHER): Payer: Medicaid Other | Admitting: Adult Health

## 2021-09-16 VITALS — BP 122/71 | HR 77 | Temp 97.7°F | Resp 18 | Ht 64.0 in | Wt 216.3 lb

## 2021-09-16 DIAGNOSIS — Z5111 Encounter for antineoplastic chemotherapy: Secondary | ICD-10-CM | POA: Diagnosis not present

## 2021-09-16 DIAGNOSIS — Z170 Estrogen receptor positive status [ER+]: Secondary | ICD-10-CM

## 2021-09-16 DIAGNOSIS — C50411 Malignant neoplasm of upper-outer quadrant of right female breast: Secondary | ICD-10-CM

## 2021-09-16 DIAGNOSIS — M25511 Pain in right shoulder: Secondary | ICD-10-CM

## 2021-09-16 LAB — CMP (CANCER CENTER ONLY)
ALT: 18 U/L (ref 0–44)
AST: 15 U/L (ref 15–41)
Albumin: 4 g/dL (ref 3.5–5.0)
Alkaline Phosphatase: 56 U/L (ref 38–126)
Anion gap: 9 (ref 5–15)
BUN: 17 mg/dL (ref 6–20)
CO2: 26 mmol/L (ref 22–32)
Calcium: 9.2 mg/dL (ref 8.9–10.3)
Chloride: 106 mmol/L (ref 98–111)
Creatinine: 0.6 mg/dL (ref 0.44–1.00)
GFR, Estimated: >60 mL/min (ref >60)
Glucose, Bld: 123 mg/dL — ABNORMAL HIGH (ref 70–99)
Potassium: 3.7 mmol/L (ref 3.5–5.1)
Sodium: 141 mmol/L (ref 135–145)
Total Bilirubin: 0.4 mg/dL (ref 0.3–1.2)
Total Protein: 7.1 g/dL (ref 6.5–8.1)

## 2021-09-16 LAB — CBC WITH DIFFERENTIAL (CANCER CENTER ONLY)
Abs Immature Granulocytes: 0.01 10*3/uL (ref 0.00–0.07)
Basophils Absolute: 0 10*3/uL (ref 0.0–0.1)
Basophils Relative: 1 %
Eosinophils Absolute: 0.2 10*3/uL (ref 0.0–0.5)
Eosinophils Relative: 3 %
HCT: 36.1 % (ref 36.0–46.0)
Hemoglobin: 12 g/dL (ref 12.0–15.0)
Immature Granulocytes: 0 %
Lymphocytes Relative: 23 %
Lymphs Abs: 1.4 10*3/uL (ref 0.7–4.0)
MCH: 29.9 pg (ref 26.0–34.0)
MCHC: 33.2 g/dL (ref 30.0–36.0)
MCV: 89.8 fL (ref 80.0–100.0)
Monocytes Absolute: 0.5 10*3/uL (ref 0.1–1.0)
Monocytes Relative: 8 %
Neutro Abs: 3.9 10*3/uL (ref 1.7–7.7)
Neutrophils Relative %: 65 %
Platelet Count: 204 10*3/uL (ref 150–400)
RBC: 4.02 MIL/uL (ref 3.87–5.11)
RDW: 12.4 % (ref 11.5–15.5)
WBC Count: 6 10*3/uL (ref 4.0–10.5)
nRBC: 0 % (ref 0.0–0.2)

## 2021-09-16 LAB — MAGNESIUM: Magnesium: 1.7 mg/dL (ref 1.7–2.4)

## 2021-09-16 NOTE — Assessment & Plan Note (Addendum)
07/28/2020:Patient palpated a right breast mass for 1-2 years. Mammogram showed a 2.2cm mass at the 11 o'clock position with surrounding calcifications, 6.4cm in total extent, and up to 5 abnormal right axillary lymph nodes. 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  Treatment plan: 1. 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 Adjuvant radiation therapy 05/11/2021-07/04/2021 4. Follow-up adjuvant antiestrogen therapy along with abemaciclib (patient has total of 4 lymph nodes positive), to start 07/19/2021 [facility] 16070: Treatment of refractory nausea 5. ALND 03/28/21: 3/7 LN positive ------------------------------------------------------------------------------------------------------------------------------- Current treatment: Zoladex plus letrozole plus   She continues on treatment.  Breast changes are ? Related to lymphedema.  I placed orders for right breast diagnostic mammogram and ultrasound to further evaluate due to the new breast changes she has noticed.    She will return on 09/26/2020 to see Dr. Lindi Adie.

## 2021-09-16 NOTE — Progress Notes (Signed)
[facility] Follow up:    Patient, [name] No address on file   DIAGNOSIS:  Cancer Staging  Malignant neoplasm of upper-outer quadrant of right breast in female, estrogen receptor positive ([place]) Staging form: Breast, AJCC 8th Edition - Clinical stage from 08/04/2020: Stage IIA (cT2, cN1, cM0, G2, ER+, PR+, HER2-) - Unsigned Stage prefix: Initial diagnosis Histologic grading system: 3 grade system   SUMMARY OF ONCOLOGIC HISTORY: Oncology History  Malignant neoplasm of upper-outer quadrant of right breast in female, estrogen receptor positive ([place])  07/28/2020 Initial Diagnosis   Patient palpated a right breast mass for 1-2 years. Mammogram showed a 2.2cm mass at the 11 o'clock position with surrounding calcifications, 6.4cm in total extent, and up to 5 abnormal right axillary lymph nodes. 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    08/05/2020 Miscellaneous   MammaPrint: High risk luminal type B   08/11/2020 Genetic Testing   Negative genetic testing: no pathogenic variants detected in [facility] Breast Cancer STAT Panel or Common Hereditary Cancers panel. The report dates are August 11, 2020 and August 19, 2020, respectively. Two variants of uncertain signficance were detected - one in the CTNNA1 gene called c.86del and the second in the MLH1 gene called c.808A>G.   UPDATE:  The MLH1 c.808A>G VUS was reclassified to "Likely Benign" on 02/19/2021. The change in variant classification was made as a result of re-review of the evidence in light of new variant interpretation guidelines and/or new information.   The STAT Breast cancer panel offered by [facility] includes sequencing and rearrangement analysis for the following 9 genes:  ATM, BRCA1, BRCA2, CDH1, CHEK2, PALB2, PTEN, STK11 and TP53.  The Common Hereditary Cancers  Panel offered by [facility] includes sequencing and/or deletion duplication testing of the following 48 genes: APC, ATM, AXIN2, BARD1, BMPR1A, BRCA1, BRCA2, BRIP1, CDH1, CDK4, CDKN2A (p14ARF), CDKN2A (p16INK4a), CHEK2, CTNNA1, DICER1, EPCAM (Deletion/duplication testing only), GREM1 (promoter region deletion/duplication testing only), KIT, MEN1, MLH1, MSH2, MSH3, MSH6, MUTYH, NBN, NF1, NTHL1, PALB2, PDGFRA, PMS2, POLD1, POLE, PTEN, RAD50, RAD51C, RAD51D, RNF43, SDHB, SDHC, SDHD, SMAD4, SMARCA4. STK11, TP53, TSC1, TSC2, and VHL.  The following genes were evaluated for sequence changes only: SDHA and HOXB13 c.251G>A variant only.    09/01/2020 - 01/11/2021 Neo-Adjuvant Chemotherapy   Adriamycin and Cytoxan x4 09/01/2020-10/12/2020 Weekly Taxol x 12  10/26/2020-01/11/2021(dose reduced d/t AE)   02/07/2021 Surgery   Right lumpectomy [name]): invasive and in situ ductal carcinoma, 2.8cm, clear margins, with metastatic carcinoma in 1/1 right axillary lymph nodes.   03/28/2021 Surgery   Axillary lymph node dissection: 3/7 lymph nodes +   05/10/2021 - 07/01/2021 Radiation Therapy   Site Technique Total Dose (Gy) Dose per Fx (Gy) Completed Fx Beam Energies  Breast, Right: Breast_Rt 3D 50.4/50.4 1.8 28/28 10X  Breast, Right: Breast_Rt_Bst specialPort 12/12 2 6/6 15E  Sclav-RT: SCV_Rt 3D 50.4/50.4 1.8 28/28      07/2021 -  Anti-estrogen oral therapy   Zoladex + Letrozole + Verzenio     CURRENT THERAPY: Letrozole + Zoladex  INTERVAL HISTORY: Melody Rodriguez 45 y.o. female returns for evaluation of right breast erythema and swelling.  This began about 2 days ago.  She has not had fever or chills.  Her breast is painful.     Patient Active Problem List   Diagnosis Date Noted   Pap  smear for cervical cancer screening 08/19/2021   Bartholin cyst 05/06/2021   Amenorrhea, secondary 03/02/2021   PICC (peripherally inserted central catheter) in place 09/09/2020   Genetic testing 08/11/2020   Iron deficiency  anemia due to chronic blood loss 08/04/2020   Family history of colon cancer    Malignant neoplasm of upper-outer quadrant of right breast in female, estrogen receptor positive (Melody Rodriguez) 07/28/2020    is allergic to cephalexin, latex, and naproxen.  MEDICAL HISTORY: Past Medical History:  Diagnosis Date   Breast cancer (Melody Rodriguez)    Diabetes mellitus without complication ([place])    Family history of colon cancer    History of radiation therapy    right breast/scv  05/10/21-07/01/21  Dr Gery Pray    SURGICAL HISTORY: Past Surgical History:  Procedure Laterality Date   BREAST BIOPSY Right 02/18/2021   Procedure: EVACUATION HEMATOMA RIGHT AXILLA;  Surgeon: [name], MD;  Location: [facility];  Service: General;  Laterality: Right;   BREAST CYST EXCISION Right    Patient does not recall (2014 or 2015)   BREAST LUMPECTOMY WITH RADIOACTIVE SEED AND SENTINEL LYMPH NODE BIOPSY Right 02/07/2021   Procedure: RIGHT BREAST LUMPECTOMY WITH RADIOACTIVE SEED AND SEED TARGETED LYMPH NODE BIOPSY AND SENTINEL LYMPH NODE BIOPSY;  Surgeon: [name], MD;  Location: [facility];  Service: General;  Laterality: Right;   CESAREAN SECTION     IR IMAGING GUIDED PORT INSERTION  09/15/2020   IR REMOVAL TUN CV CATH W/O FL  09/15/2020   NODE DISSECTION Right 03/28/2021   Procedure: RIGHT AXILLARY LYMPH NODE DISSECTION;  Surgeon: [name], MD;  Location: [facility] [place];  Service: General;  Laterality: Right;   PORT-A-CATH REMOVAL Left 02/07/2021   Procedure: REMOVAL PORT-A-CATH;  Surgeon: [name], MD;  Location: [facility];  Service: General;  Laterality: Left;   THYROIDECTOMY, PARTIAL      SOCIAL HISTORY: Social History   Socioeconomic History   Marital status: Single    Spouse name: Not on file   Number of children: 2   Years of education: Not on file   Highest education level: Some college, no degree  Occupational History    Not on file  Tobacco Use   Smoking status: Former    Packs/day: 1.00    Years: 22.00    Pack years: 22.00    Types: Cigars, Cigarettes   Smokeless tobacco: Never   Tobacco comments:    quit 07/2020  Vaping Use   Vaping Use: Never used  Substance and Sexual Activity   Alcohol use: Yes    Comment: occas   Drug use: Yes    Types: Marijuana    Comment: chemo/ pain mgmt   Sexual activity: Not Currently  Other Topics Concern   Not on file  Social History Narrative   Not on file   Social Determinants of Health   Financial Resource Strain: High Risk   Difficulty of Paying Living Expenses: Hard  Food Insecurity: Food Insecurity Present   Worried About Running Out of Food in the Last Year: Sometimes true   Ran Out of Food in the Last Year: Sometimes true  Transportation Needs: No Transportation Needs   Lack of Transportation (Medical): No   Lack of Transportation (Non-Medical): No  Physical Activity: Inactive   Days of Exercise per Week: 0 days   Minutes of Exercise per Session: 0 min  Stress: Stress Concern Present   Feeling of Stress : To some extent  Social Connections: Socially Isolated   Frequency of Communication with Friends and Family: Three times a week   Frequency of Social Gatherings with Friends and Family: More than three times a week   Attends Religious Services: Never   Marine scientist or Organizations: No   Attends Music therapist: Never   Marital Status: Never married  Human resources officer Violence: Not At Risk   Fear of Current or Ex-Partner: No   Emotionally Abused: No   Physically Abused: No   Sexually Abused: No    FAMILY HISTORY: Family History  Problem Relation Age of Onset   Hypertension Mother    Colon cancer Sister 96   Diabetes Maternal Grandmother    Cancer Maternal Aunt        unknown type dx late 8s    Review of Systems  Constitutional:  Negative for appetite change, chills, fatigue, fever and unexpected weight  change.  HENT:   Negative for hearing loss, lump/mass and trouble swallowing.   Eyes:  Negative for eye problems and icterus.  Respiratory:  Negative for chest tightness, cough and shortness of breath.   Cardiovascular:  Negative for chest pain, leg swelling and palpitations.  Gastrointestinal:  Negative for abdominal distention, abdominal pain, constipation, diarrhea, nausea and vomiting.  Endocrine: Negative for hot flashes.  Genitourinary:  Negative for difficulty urinating.   Musculoskeletal:  Negative for arthralgias.  Skin:  Negative for itching and rash.  Neurological:  Negative for dizziness, extremity weakness, headaches and numbness.  Hematological:  Negative for adenopathy. Does not bruise/bleed easily.  Psychiatric/Behavioral:  Negative for depression. The patient is not nervous/anxious.      PHYSICAL EXAMINATION  ECOG PERFORMANCE STATUS: 1 - Symptomatic but completely ambulatory  Vitals:   09/16/21 1632  BP: 122/71  Pulse: 77  Resp: 18  Temp: 97.7 F (36.5 C)  SpO2: 100%    Physical Exam Constitutional:      General: She is not in acute distress.    Appearance: Normal appearance. She is not toxic-appearing.  HENT:     Head: Normocephalic and atraumatic.  Eyes:     General: No scleral icterus. Cardiovascular:     Rate and Rhythm: Normal rate and regular rhythm.     Pulses: Normal pulses.     Heart sounds: Normal heart sounds.  Pulmonary:     Effort: Pulmonary effort is normal.     Breath sounds: Normal breath sounds.     Comments: Right breast swelling.  There is thickening in right breast that is noted in right lateral breast.  Skin changes are present on the skin, likely radiation related Abdominal:     General: Abdomen is flat. Bowel sounds are normal. There is no distension.     Palpations: Abdomen is soft.     Tenderness: There is no abdominal tenderness.  Musculoskeletal:        General: No swelling.     Cervical back: Neck supple.   Lymphadenopathy:     Cervical: No cervical adenopathy.  Skin:    General: Skin is warm and dry.     Findings: No rash.  Neurological:     General: No focal deficit present.     Mental Status: She is alert.  Psychiatric:        Mood and Affect: Mood normal.        Behavior: Behavior normal.    LABORATORY DATA:  CBC    Component Value Date/Time   WBC 6.0 09/16/2021 1611  WBC 11.3 (H) 09/12/2020 0056   RBC 4.02 09/16/2021 1611   HGB 12.0 09/16/2021 1611   HCT 36.1 09/16/2021 1611   PLT 204 09/16/2021 1611   MCV 89.8 09/16/2021 1611   MCH 29.9 09/16/2021 1611   MCHC 33.2 09/16/2021 1611   RDW 12.4 09/16/2021 1611   LYMPHSABS 1.4 09/16/2021 1611   MONOABS 0.5 09/16/2021 1611   EOSABS 0.2 09/16/2021 1611   BASOSABS 0.0 09/16/2021 1611    CMP     Component Value Date/Time   NA 138 08/01/2021 0951   K 4.0 08/01/2021 0951   CL 102 08/01/2021 0951   CO2 23 08/01/2021 0951   GLUCOSE 400 (H) 08/01/2021 0951   BUN 13 08/01/2021 0951   CREATININE 1.01 (H) 08/01/2021 0951   CALCIUM 9.9 08/01/2021 0951   PROT 8.7 (H) 08/01/2021 0951   ALBUMIN 4.7 08/01/2021 0951   AST 30 08/01/2021 0951   ALT 39 08/01/2021 0951   ALKPHOS 94 08/01/2021 0951   BILITOT 0.4 08/01/2021 0951   GFRNONAA >60 08/01/2021 0951   GFRAA >60 01/12/2020 0759    RADIOGRAPHIC STUDIES:  MR SHOULDER RIGHT WO CONTRAST  Result Date: 09/16/2021 CLINICAL DATA:  Right shoulder pain and lymphedema since surgery to remove lymph nodes. Patient reports loss of arm strength. Clinical concern for labral tear and rotator cuff tear. EXAM: MRI OF THE RIGHT SHOULDER WITHOUT CONTRAST TECHNIQUE: Multiplanar, multisequence MR imaging of the shoulder was performed. No intravenous contrast was administered. COMPARISON:  X-ray shoulder 07/27/2021. FINDINGS: Rotator cuff: The supraspinatus tendon is intact. There is mild distal infraspinatus tendinosis. Teres minor tendon is intact. Moderate distal subscapularis tendinosis.  Muscles: No significant muscle atrophy. Faint intramuscular edema within teres minor. Biceps Long Head: Intraarticular and extraarticular portions of the biceps tendon are intact. Acromioclavicular Joint: Mild arthropathy of the acromioclavicular joint. No significant subacromial/subdeltoid bursal fluid. Glenohumeral Joint: No significant joint effusion. No chondral defect. Labrum: Grossly intact, but evaluation is limited by lack of intraarticular fluid/contrast. Bones: No fracture or dislocation. No aggressive osseous lesion. Other: There is mild soft tissue edema within the right axilla compatible with history of prior axillary lymph node dissection. IMPRESSION: 1. Mild distal infraspinatus tendinosis. No evidence of rotator cuff tear. No evidence of labral tear. 2. Faint intramuscular edema within teres minor, consistent with low-grade muscle strain. 3. Mild soft tissue edema within the right axilla compatible with history of prior axillary lymph node dissection. Electronically Signed   By: Maurine Simmering M.D.   On: 09/16/2021 12:14       ASSESSMENT and THERAPY PLAN:   Malignant neoplasm of upper-outer quadrant of right breast in female, estrogen receptor positive ([place]) 07/28/2020:Patient palpated a right breast mass for 1-2 years. Mammogram showed a 2.2cm mass at the 11 o'clock position with surrounding calcifications, 6.4cm in total extent, and up to 5 abnormal right axillary lymph nodes. 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   Treatment plan: 1. Neoadjuvant chemotherapy (MammaPrint test High Risk): AC foll by Taxol completed 01/11/21 2. 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  Adjuvant radiation therapy 05/11/2021-07/04/2021 4. Follow-up adjuvant antiestrogen therapy along with abemaciclib (patient has total of 4 lymph nodes positive), to start 07/19/2021  [facility] [address]: Treatment of refractory nausea 5. ALND 03/28/21: 3/7 LN positive -------------------------------------------------------------------------------------------------------------------------------  Current treatment: Zoladex plus letrozole plus   She continues on treatment.  Breast changes are ? Related to lymphedema.  I placed orders for right breast diagnostic mammogram and ultrasound to further evaluate due to the new breast changes she has noticed.    She will return on 09/26/2020 to see Dr. Lindi Adie.      All questions were answered. The patient knows to call the clinic with any problems, questions or concerns. We can certainly see the patient much sooner if necessary.  Total encounter time: 20 minutes in face to face visit time,c hart review, lab review, care coordination, order entry, and documentation of the encounter.  Wilber Bihari, NP 09/16/21 4:55 PM Medical Oncology and Hematology [facility] [place], [place] [address] Tel. [phone]    Fax. [phone]  *Total Encounter Time as defined by the Centers for Medicare and Medicaid Services includes, in addition to the face-to-face time of a patient visit (documented in the note above) non-face-to-face time: obtaining and reviewing outside history, ordering and reviewing medications, tests or procedures, care coordination (communications with other health care professionals or caregivers) and documentation in the medical record.

## 2021-09-20 ENCOUNTER — Telehealth: Payer: Self-pay | Admitting: *Deleted

## 2021-09-20 NOTE — Telephone Encounter (Signed)
Appt for dx mammo and [place] scheduled for Sep 23 2021 at the [facility].  Pt notified of the above.

## 2021-09-23 ENCOUNTER — Ambulatory Visit
Admission: RE | Admit: 2021-09-23 | Discharge: 2021-09-23 | Disposition: A | Discharge status: Home / Self Care | Payer: Medicaid Other | Source: Ambulatory Visit | Attending: Adult Health | Admitting: Adult Health

## 2021-09-23 ENCOUNTER — Encounter: Payer: Self-pay | Admitting: Hematology and Oncology

## 2021-09-23 DIAGNOSIS — C50411 Malignant neoplasm of upper-outer quadrant of right female breast: Secondary | ICD-10-CM

## 2021-09-23 DIAGNOSIS — Z170 Estrogen receptor positive status [ER+]: Secondary | ICD-10-CM

## 2021-09-25 NOTE — Assessment & Plan Note (Signed)
07/28/2020:Patient palpated a right breast mass for 1-2 years. Mammogram showed a 2.2cm mass at the 11 o'clock position with surrounding calcifications, 6.4cm in total extent, and up to 5 abnormal right axillary lymph nodes. 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  Treatment plan: 1. 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 Adjuvant radiation therapy8/24/2022-07/04/2021 4. Follow-up adjuvant antiestrogen therapyalong with abemaciclib (patient has total of 4 lymph nodes positive), to start 07/19/2021 [facility] [address]: Treatment of refractory nausea 5. ALND 03/28/21: 3/7 LN positive ------------------------------------------------------------------------------------------------------------------------------- Current treatment: Zoladex plus letrozole plus Verzenio (Not started yet) Toxicities: 1. Leg pains: inspite of stopping letrozole her leg pains did not improve. So she will restart letrozole 2. RTC monthly for Injections  Suspect her pains are from diabetes complications Bone Density: 08/31/21: T Score 1.1 (Normal) Mammograms: 09/23/21: Benign, density Cat C  U/S: Consider skin biopsy of theres a concern  Return to clinic in 1 year

## 2021-09-26 ENCOUNTER — Inpatient Hospital Stay: Payer: Medicaid Other

## 2021-09-26 ENCOUNTER — Inpatient Hospital Stay: Payer: Medicaid Other | Attending: Hematology and Oncology

## 2021-09-26 ENCOUNTER — Ambulatory Visit: Payer: Medicaid Other

## 2021-09-26 ENCOUNTER — Other Ambulatory Visit (HOSPITAL_COMMUNITY): Payer: Self-pay

## 2021-09-26 ENCOUNTER — Other Ambulatory Visit: Payer: Self-pay

## 2021-09-26 ENCOUNTER — Inpatient Hospital Stay (HOSPITAL_BASED_OUTPATIENT_CLINIC_OR_DEPARTMENT_OTHER): Payer: Medicaid Other | Admitting: Hematology and Oncology

## 2021-09-26 DIAGNOSIS — C773 Secondary and unspecified malignant neoplasm of axilla and upper limb lymph nodes: Secondary | ICD-10-CM | POA: Insufficient documentation

## 2021-09-26 DIAGNOSIS — Z7984 Long term (current) use of oral hypoglycemic drugs: Secondary | ICD-10-CM | POA: Insufficient documentation

## 2021-09-26 DIAGNOSIS — Z170 Estrogen receptor positive status [ER+]: Secondary | ICD-10-CM | POA: Diagnosis not present

## 2021-09-26 DIAGNOSIS — E119 Type 2 diabetes mellitus without complications: Secondary | ICD-10-CM | POA: Insufficient documentation

## 2021-09-26 DIAGNOSIS — Z9221 Personal history of antineoplastic chemotherapy: Secondary | ICD-10-CM | POA: Diagnosis not present

## 2021-09-26 DIAGNOSIS — Z923 Personal history of irradiation: Secondary | ICD-10-CM | POA: Insufficient documentation

## 2021-09-26 DIAGNOSIS — Z5111 Encounter for antineoplastic chemotherapy: Secondary | ICD-10-CM | POA: Diagnosis not present

## 2021-09-26 DIAGNOSIS — C50411 Malignant neoplasm of upper-outer quadrant of right female breast: Secondary | ICD-10-CM | POA: Diagnosis not present

## 2021-09-26 DIAGNOSIS — Z79811 Long term (current) use of aromatase inhibitors: Secondary | ICD-10-CM | POA: Diagnosis not present

## 2021-09-26 DIAGNOSIS — Z79899 Other long term (current) drug therapy: Secondary | ICD-10-CM | POA: Insufficient documentation

## 2021-09-26 LAB — CBC WITH DIFFERENTIAL (CANCER CENTER ONLY)
Abs Immature Granulocytes: 0.01 10*3/uL (ref 0.00–0.07)
Basophils Absolute: 0.1 10*3/uL (ref 0.0–0.1)
Basophils Relative: 1 %
Eosinophils Absolute: 0.2 10*3/uL (ref 0.0–0.5)
Eosinophils Relative: 5 %
HCT: 38.6 % (ref 36.0–46.0)
Hemoglobin: 12.8 g/dL (ref 12.0–15.0)
Immature Granulocytes: 0 %
Lymphocytes Relative: 23 %
Lymphs Abs: 1.1 10*3/uL (ref 0.7–4.0)
MCH: 29.8 pg (ref 26.0–34.0)
MCHC: 33.2 g/dL (ref 30.0–36.0)
MCV: 90 fL (ref 80.0–100.0)
Monocytes Absolute: 0.3 10*3/uL (ref 0.1–1.0)
Monocytes Relative: 6 %
Neutro Abs: 3.3 10*3/uL (ref 1.7–7.7)
Neutrophils Relative %: 65 %
Platelet Count: 237 10*3/uL (ref 150–400)
RBC: 4.29 MIL/uL (ref 3.87–5.11)
RDW: 12.5 % (ref 11.5–15.5)
WBC Count: 5 10*3/uL (ref 4.0–10.5)
nRBC: 0 % (ref 0.0–0.2)

## 2021-09-26 LAB — CMP (CANCER CENTER ONLY)
ALT: 16 U/L (ref 0–44)
AST: 12 U/L — ABNORMAL LOW (ref 15–41)
Albumin: 4.3 g/dL (ref 3.5–5.0)
Alkaline Phosphatase: 58 U/L (ref 38–126)
Anion gap: 8 (ref 5–15)
BUN: 13 mg/dL (ref 6–20)
CO2: 23 mmol/L (ref 22–32)
Calcium: 9.2 mg/dL (ref 8.9–10.3)
Chloride: 108 mmol/L (ref 98–111)
Creatinine: 0.58 mg/dL (ref 0.44–1.00)
GFR, Estimated: >60 mL/min (ref >60)
Glucose, Bld: 221 mg/dL — ABNORMAL HIGH (ref 70–99)
Potassium: 4.2 mmol/L (ref 3.5–5.1)
Sodium: 139 mmol/L (ref 135–145)
Total Bilirubin: 0.3 mg/dL (ref 0.3–1.2)
Total Protein: 7.2 g/dL (ref 6.5–8.1)

## 2021-09-26 LAB — MAGNESIUM: Magnesium: 1.8 mg/dL (ref 1.7–2.4)

## 2021-09-26 MED ORDER — GOSERELIN ACETATE 3.6 MG ~~LOC~~ IMPL
3.6000 mg | DRUG_IMPLANT | Freq: Once | SUBCUTANEOUS | Status: AC
  Administered 2021-09-26: 3.6 mg via SUBCUTANEOUS
  Filled 2021-09-26: qty 3.6

## 2021-09-26 MED ORDER — METAXALONE 400 MG PO TABS
400.0000 mg | ORAL_TABLET | Freq: Every day | ORAL | 0 refills | Status: DC | PRN
  Filled 2021-09-26 – 2021-10-20 (×3): qty 30, 30d supply, fill #0

## 2021-09-26 NOTE — Progress Notes (Signed)
Patient Care Team: Patient, [name] as PCP - General (General Practice) [name], MD as Consulting Physician (General Surgery) Nicholas Lose, MD as Consulting Physician (Hematology and Oncology) Gery Pray, MD as Consulting Physician (Radiation Oncology)  DIAGNOSIS:  Encounter Diagnosis  Name Primary?   Malignant neoplasm of upper-outer quadrant of right breast in female, estrogen receptor positive ([place])     SUMMARY OF ONCOLOGIC HISTORY: Oncology History  Malignant neoplasm of upper-outer quadrant of right breast in female, estrogen receptor positive ([place])  07/28/2020 Initial Diagnosis   Patient palpated a right breast mass for 1-2 years. Mammogram showed a 2.2cm mass at the 11 o'clock position with surrounding calcifications, 6.4cm in total extent, and up to 5 abnormal right axillary lymph nodes. 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    08/05/2020 Miscellaneous   MammaPrint: High risk luminal type B   08/11/2020 Genetic Testing   Negative genetic testing: no pathogenic variants detected in [facility] Breast Cancer STAT Panel or Common Hereditary Cancers panel. The report dates are August 11, 2020 and August 19, 2020, respectively. Two variants of uncertain signficance were detected - one in the CTNNA1 gene called c.86del and the second in the MLH1 gene called c.808A>G.   UPDATE:  The MLH1 c.808A>G VUS was reclassified to "Likely Benign" on 02/19/2021. The change in variant classification was made as a result of re-review of the evidence in light of new variant interpretation guidelines and/or new information.   The STAT Breast cancer panel offered by [facility] includes sequencing and rearrangement analysis for the following 9 genes:  ATM, BRCA1, BRCA2, CDH1, CHEK2, PALB2, PTEN, STK11 and TP53.  The Common Hereditary Cancers  Panel offered by [facility] includes sequencing and/or deletion duplication testing of the following 48 genes: APC, ATM, AXIN2, BARD1, BMPR1A, BRCA1, BRCA2, BRIP1, CDH1, CDK4, CDKN2A (p14ARF), CDKN2A (p16INK4a), CHEK2, CTNNA1, DICER1, EPCAM (Deletion/duplication testing only), GREM1 (promoter region deletion/duplication testing only), KIT, MEN1, MLH1, MSH2, MSH3, MSH6, MUTYH, NBN, NF1, NTHL1, PALB2, PDGFRA, PMS2, POLD1, POLE, PTEN, RAD50, RAD51C, RAD51D, RNF43, SDHB, SDHC, SDHD, SMAD4, SMARCA4. STK11, TP53, TSC1, TSC2, and VHL.  The following genes were evaluated for sequence changes only: SDHA and HOXB13 c.251G>A variant only.    09/01/2020 - 01/11/2021 Neo-Adjuvant Chemotherapy   Adriamycin and Cytoxan x4 09/01/2020-10/12/2020 Weekly Taxol x 12  10/26/2020-01/11/2021(dose reduced d/t AE)   02/07/2021 Surgery   Right lumpectomy [name]): invasive and in situ ductal carcinoma, 2.8cm, clear margins, with metastatic carcinoma in 1/1 right axillary lymph nodes.   03/28/2021 Surgery   Axillary lymph node dissection: 3/7 lymph nodes +   05/10/2021 - 07/01/2021 Radiation Therapy   Site Technique Total Dose (Gy) Dose per Fx (Gy) Completed Fx Beam Energies  Breast, Right: Breast_Rt 3D 50.4/50.4 1.8 28/28 10X  Breast, Right: Breast_Rt_Bst specialPort 12/12 2 6/6 15E  Sclav-RT: SCV_Rt 3D 50.4/50.4 1.8 28/28      07/2021 -  Anti-estrogen oral therapy   Zoladex + Letrozole + Verzenio     CHIEF COMPLIANT:   INTERVAL HISTORY: Melody Rodriguez is a   ALLERGIES:  is allergic to cephalexin, latex, and naproxen.  MEDICATIONS:  Current Outpatient Medications  Medication Sig Dispense Refill   acetaminophen (TYLENOL) 325 MG tablet Take by mouth.     atorvastatin (LIPITOR) 10 MG tablet Take one tablet (10 mg dose) by mouth daily. 30 tablet 5  benzonatate (TESSALON) 100 MG capsule Take 1 capsule (100 mg total) by mouth every 8 (eight) hours. 21 capsule 0   cyclobenzaprine (FLEXERIL) 5 MG tablet TAKE 1 TABLET BY  MOUTH 3 TIMES DAILY AS NEEDED FOR MUSCLE SPASMS. 20 tablet 0   fexofenadine (ALLEGRA) 180 MG tablet Take by mouth. (Patient not taking: Reported on 08/29/2021)     fluticasone (FLONASE) 50 MCG/ACT nasal spray 2 sprays by Each Nare route daily for 30 days. (Patient not taking: Reported on 08/29/2021)     glipiZIDE (GLUCOTROL) 5 MG tablet Take one tablet (5 mg dose) by mouth 2 (two) times daily. 60 tablet 5   glipiZIDE (GLUCOTROL) 5 MG tablet Take 1 tablet by mouth 2 times daily before meals. 30 tablet 3   letrozole (FEMARA) 2.5 MG tablet Take 1 tablet (2.5 mg total) by mouth daily. 90 tablet 3   ondansetron (ZOFRAN-ODT) 8 MG disintegrating tablet Take 1 tablet (8 mg total) by mouth every 8 (eight) hours as needed for nausea or vomiting. 20 tablet 0   oxyCODONE (OXY IR/ROXICODONE) 5 MG immediate release tablet Take 1-2 tablets (5-10 mg total) by mouth every 6 (six) hours as needed for moderate pain, severe pain or breakthrough pain. (Patient not taking: Reported on 08/29/2021) 30 tablet 0   pioglitazone (ACTOS) 15 MG tablet Take one tablet (15 mg dose) by mouth daily. 30 tablet 5   promethazine-dextromethorphan (PROMETHAZINE-DM) 6.25-15 MG/5ML syrup Take 5 mLs by mouth 4 (four) times daily as needed for cough. 118 mL 0   triamcinolone cream (KENALOG) 0.1 % Apply topically 2 (two) times a day as needed. (Patient not taking: Reported on 08/29/2021) 40 g 1   No current facility-administered medications for this visit.    PHYSICAL EXAMINATION: ECOG PERFORMANCE STATUS: 1 - Symptomatic but completely ambulatory  There were no vitals filed for this visit. There were no vitals filed for this visit.   LABORATORY DATA:  I have reviewed the data as listed CMP Latest Ref Rng & Units 09/16/2021 08/01/2021 02/03/2021  Glucose 70 - 99 mg/dL 123(H) 400(H) 197(H)  BUN 6 - 20 mg/dL _0 Creatinine 0.44 - 1.00 mg/dL 0.60 1.01(H) 0.56  Sodium 135 - 145 mmol/L 141 138 135  Potassium 3.5 - 5.1 mmol/L 3.7 4.0  4.2  Chloride 98 - 111 mmol/L 106 102 101  CO2 22 - 32 mmol/L _1 Calcium 8.9 - 10.3 mg/dL 9.2 9.9 9.7  Total Protein 6.5 - 8.1 g/dL 7.1 8.7(H) -  Total Bilirubin 0.3 - 1.2 mg/dL 0.4 0.4 -  Alkaline Phos 38 - 126 U/L 56 94 -  AST 15 - 41 U/L 15 30 -  ALT 0 - 44 U/L 18 39 -    Lab Results  Component Value Date   WBC 6.0 09/16/2021   HGB 12.0 09/16/2021   HCT 36.1 09/16/2021   MCV 89.8 09/16/2021   PLT 204 09/16/2021   NEUTROABS 3.9 09/16/2021    ASSESSMENT & PLAN:  Malignant neoplasm of upper-outer quadrant of right breast in female, estrogen receptor positive ([place]) 07/28/2020:Patient palpated a right breast mass for 1-2 years. Mammogram showed a 2.2cm mass at the 11 o'clock position with surrounding calcifications, 6.4cm in total extent, and up to 5 abnormal right axillary lymph nodes. 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   Treatment plan: 1.  Neoadjuvant chemotherapy (MammaPrint test High Risk): AC foll by Taxol completed 01/11/21 2. 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 Adjuvant radiation therapy 05/11/2021-07/04/2021 4. Follow-up adjuvant antiestrogen therapy along with abemaciclib (patient has total of 4 lymph nodes positive), to start 07/19/2021 [facility] 16070: Treatment of refractory nausea 5. ALND 03/28/21: 3/7 LN positive -------------------------------------------------------------------------------------------------------------------------------  Current treatment: Zoladex plus letrozole plus Verzenio (patient does not want to start Verzenio because of concern for toxicities) Toxicities: Leg pains: And joint stiffness and achiness RTC monthly for  Injections Hot flashes mild to moderate   Suspect her pains are from diabetes complications Bone Density: 08/31/21: T Score 1.1 (Normal) Mammograms: 09/23/21: Benign, density Cat C  U/S: Consider skin biopsy of theres a concern Severe right arm lymphedema:  Right shoulder pain: Shoulder MRI was performed.  Waiting to see orthopedics.  Return to clinic in 6 months for follow-up   No orders of the defined types were placed in this encounter.  The patient has a good understanding of the overall plan. she agrees with it. she will call with any problems that may develop before the next visit here. Total time spent: 30 mins including face to face time and time spent for planning, charting and co-ordination of care   Harriette Ohara, MD 09/26/21

## 2021-09-27 ENCOUNTER — Other Ambulatory Visit (HOSPITAL_COMMUNITY): Payer: Self-pay

## 2021-09-27 ENCOUNTER — Encounter: Payer: Self-pay | Admitting: Orthopaedic Surgery

## 2021-09-27 ENCOUNTER — Ambulatory Visit (INDEPENDENT_AMBULATORY_CARE_PROVIDER_SITE_OTHER): Payer: Medicaid Other | Admitting: Orthopaedic Surgery

## 2021-09-27 DIAGNOSIS — M25511 Pain in right shoulder: Secondary | ICD-10-CM | POA: Diagnosis not present

## 2021-09-27 DIAGNOSIS — G8929 Other chronic pain: Secondary | ICD-10-CM | POA: Diagnosis not present

## 2021-09-27 MED ORDER — MELOXICAM 15 MG PO TABS
15.0000 mg | ORAL_TABLET | Freq: Every day | ORAL | 3 refills | Status: DC | PRN
  Filled 2021-09-27: qty 30, 30d supply, fill #0

## 2021-09-27 NOTE — Progress Notes (Signed)
The patient comes in today to go over MRI of her right shoulder.  She is only 46 years old and has been dealing with chronic right shoulder pain.  She has had breast cancer surgery on that right side and lymph node biopsy and dissection as well.  She hurts with overhead activities and reaching behind her.  She has had 1 intra-articular steroid injection in the shoulder joint and she has had 1 steroid injection in the subacromial outlet.  She is now a type II diabetic and she does state that her primary care physician wants to avoid steroid injections at this standpoint.  She is also been through physical therapy for her right shoulder and that really did not help her much at all.  She says some of the pain is moving into her neck.  There is no radicular component of her pain or her symptoms.  On examination her right shoulder is well located.  I can easily put her shoulder through full passive motion.  She has a lot of guarding with the shoulder but there is no evidence of arthrofibrosis.  There is no weakness in the rotator cuff.  The MRI of her right shoulder shows only some mild tendinosis of the rotator cuff with no evidence of muscle atrophy or tear.  There is no evidence of arthrofibrosis.  There is pathology within the right shoulder that would be indicative of a surgical intervention that could help her.  At this point I want her to try anti-inflammatories sent in some meloxicam.  She said her primary care physician did send in a muscle relaxant for her.  There is really nothing else that I have to offer from my standpoint.  All questions and concerns were answered addressed.  She is satisfied and not had any type of surgery either.  Follow-up is as needed.

## 2021-09-28 ENCOUNTER — Encounter: Payer: Self-pay | Admitting: Adult Health

## 2021-09-29 ENCOUNTER — Telehealth: Payer: Self-pay

## 2021-09-29 NOTE — Telephone Encounter (Signed)
Called and left pt VM regarding mychart message.  Melody Rodriguez is in communication with ortho and once we have responses we will reach back out to assist.  Advised pt to call back with any questions in the meantime

## 2021-09-30 ENCOUNTER — Other Ambulatory Visit (HOSPITAL_COMMUNITY): Payer: Self-pay

## 2021-09-30 ENCOUNTER — Encounter: Payer: Self-pay | Admitting: Hematology and Oncology

## 2021-10-03 ENCOUNTER — Other Ambulatory Visit: Payer: Self-pay

## 2021-10-03 ENCOUNTER — Telehealth: Payer: Self-pay

## 2021-10-03 ENCOUNTER — Other Ambulatory Visit (HOSPITAL_COMMUNITY): Payer: Self-pay

## 2021-10-03 DIAGNOSIS — C50411 Malignant neoplasm of upper-outer quadrant of right female breast: Secondary | ICD-10-CM

## 2021-10-03 NOTE — Progress Notes (Signed)
Referral for PT

## 2021-10-03 NOTE — Telephone Encounter (Signed)
Called pt, left VM, referral placed for PT eval and asked if pt has started meloxicam yet, requested call back from pt.

## 2021-10-05 ENCOUNTER — Other Ambulatory Visit (HOSPITAL_COMMUNITY): Payer: Self-pay

## 2021-10-06 ENCOUNTER — Encounter: Payer: Self-pay | Admitting: Physical Therapy

## 2021-10-06 ENCOUNTER — Ambulatory Visit: Payer: Medicaid Other | Attending: Adult Health | Admitting: Physical Therapy

## 2021-10-06 ENCOUNTER — Other Ambulatory Visit: Payer: Self-pay

## 2021-10-06 DIAGNOSIS — I890 Lymphedema, not elsewhere classified: Secondary | ICD-10-CM | POA: Diagnosis not present

## 2021-10-06 DIAGNOSIS — Z79899 Other long term (current) drug therapy: Secondary | ICD-10-CM | POA: Insufficient documentation

## 2021-10-06 DIAGNOSIS — M25511 Pain in right shoulder: Secondary | ICD-10-CM | POA: Insufficient documentation

## 2021-10-06 DIAGNOSIS — Z170 Estrogen receptor positive status [ER+]: Secondary | ICD-10-CM | POA: Diagnosis not present

## 2021-10-06 DIAGNOSIS — C50411 Malignant neoplasm of upper-outer quadrant of right female breast: Secondary | ICD-10-CM | POA: Insufficient documentation

## 2021-10-06 DIAGNOSIS — Z483 Aftercare following surgery for neoplasm: Secondary | ICD-10-CM | POA: Insufficient documentation

## 2021-10-06 DIAGNOSIS — M25611 Stiffness of right shoulder, not elsewhere classified: Secondary | ICD-10-CM | POA: Insufficient documentation

## 2021-10-06 DIAGNOSIS — L599 Disorder of the skin and subcutaneous tissue related to radiation, unspecified: Secondary | ICD-10-CM | POA: Insufficient documentation

## 2021-10-06 DIAGNOSIS — E119 Type 2 diabetes mellitus without complications: Secondary | ICD-10-CM | POA: Diagnosis not present

## 2021-10-06 DIAGNOSIS — R293 Abnormal posture: Secondary | ICD-10-CM | POA: Insufficient documentation

## 2021-10-06 NOTE — Therapy (Signed)
[facility] @ [place], [place], [address] Phone: [phone]   Fax:  [phone]  Physical Therapy Evaluation  Patient Details  Name: Melody Rodriguez MRN: [id number] Date of Birth: [birth year] Referring Provider (PT): Wilber Bihari   Encounter Date: 10/06/2021   PT End of Session - 10/06/21 1126     Visit Number 1    Number of Visits 17    Date for PT Re-Evaluation 12/05/21    PT Start Time 0905    PT Stop Time 1004    PT Time Calculation (min) 59 min    Activity Tolerance Patient tolerated treatment well    Behavior During Therapy [facility] for tasks assessed/performed             Past Medical History:  Diagnosis Date   Breast cancer ([place])    Diabetes mellitus without complication ([place])    Family history of colon cancer    History of radiation therapy    right breast/scv  05/10/21-07/01/21  Dr Gery Pray    Past Surgical History:  Procedure Laterality Date   BREAST BIOPSY Right 02/18/2021   Procedure: EVACUATION HEMATOMA RIGHT AXILLA;  Surgeon: [name], MD;  Location: [facility];  Service: General;  Laterality: Right;   BREAST CYST EXCISION Right    Patient does not recall (2014 or 2015)   BREAST LUMPECTOMY WITH RADIOACTIVE SEED AND SENTINEL LYMPH NODE BIOPSY Right 02/07/2021   Procedure: RIGHT BREAST LUMPECTOMY WITH RADIOACTIVE SEED AND SEED TARGETED LYMPH NODE BIOPSY AND SENTINEL LYMPH NODE BIOPSY;  Surgeon: [name], MD;  Location: [facility];  Service: General;  Laterality: Right;   CESAREAN SECTION     IR IMAGING GUIDED PORT INSERTION  09/15/2020   IR REMOVAL TUN CV CATH W/O FL  09/15/2020   NODE DISSECTION Right 03/28/2021   Procedure: RIGHT AXILLARY LYMPH NODE DISSECTION;  Surgeon: [name], MD;  Location: [facility];  Service: General;  Laterality: Right;   PORT-A-CATH REMOVAL Left 02/07/2021   Procedure: REMOVAL PORT-A-CATH;   Surgeon: [name], MD;  Location: [facility];  Service: General;  Laterality: Left;   THYROIDECTOMY, PARTIAL      There were no vitals filed for this visit.    Subjective Assessment - 10/06/21 0916     Subjective Pt comes back to PT with continued problems with her shoulder pain that she  feels is is going to her neck, swelling in her right brest and heaviness in her right arm. She got some sleeves that she has difficulty putting on. She was recently diagnosed with Type 2 diabetes and is taking oral medication She difficutly sleeping and inability to work and is struggling emotionally with all of it  Pt wears a tg soft on her arm as she feels it is comfortable.    Pertinent History Patient was diagnosed on 07/13/2020 with right grade 2 invasive ductal carcinoma breast cancer. It is ER/PR positive and HER2 negative with a Ki67 of 20%. Patient underwent neoadjuvant chemotherapy from 09/01/2020 - 01/14/2021. She had a right lumpectomy and sentinel node biopsy (1/1 nodes positive) on 02/07/2021. Hematoma evacuation on 02/18/2021. Axillary dissection on 03/28/2021 with 3/7 positive nodes. Iron deficiency. Radiation  05/10/2021-06/2021  Diagnosis of Type 2 diabetes in Nov. 2022.  Pt has had 2 steroid shots in right shoulder with only about 2 hours of relief.  She was advised not to have any more steroid shots due to her  Diabetes    Diagnostic tests MRI of right shoulder on 09/16/2021 showed mild muscle strain with intact rotator cuff.    Patient Stated Goals to get help with decreasing the pain in her arm and swelling and    Currently in Pain? Yes    Pain Score 6     Pain Location Neck    Pain Orientation Right    Pain Descriptors / Indicators Stabbing    Pain Type Chronic pain    Pain Radiating Towards down to shoulder arm and hand.    Pain Onset More than a month ago    Pain Frequency Intermittent    Aggravating Factors  when she turns her neck    Pain Relieving Factors nothing  makes it better                [facility] PT Assessment - 10/06/21 0001       Assessment   Medical Diagnosis s/p right lumpectomy and ALND    Referring Provider (PT) Wilber Bihari    Onset Date/Surgical Date 02/07/21   And ALND on 7/11/02022   Hand Dominance Right      Precautions   Precaution Comments at risk of lymphedema      Balance Screen   Has the patient fallen in the past 6 months No    Has the patient had a decrease in activity level because of a fear of falling?  Yes   sometimes pt feels "off balance"   Is the patient reluctant to leave their home because of a fear of falling?  No      Home Environment   Living Environment Private residence    Living Arrangements Children   40 year old high functioning autistic son that sometimes needs physical assist     Prior Function   Level of Independence Independent    Vocation Unemployed   previous [employer] driver   Leisure has a gym at her apartment and has been trying to the gym      Cognition   Overall Cognitive Status Within Functional Limits for tasks assessed      Observation/Other Assessments   Observations pt comes in wearing tg soft on right lower arm with visible congestion above it.  She has hyperpigmentation in right neck and upper quadrant from radiation.  She has visible fullness in right breast especailly at lateral and nipple area Pt grimaces with any touch to her upper quadrant and appears hesitant to move due to pain Pt appears to be very uncomfortable    Skin Integrity no open areas    Other Surveys  Quick Dash    Quick DASH  68.18      Sensation   Additional Comments pt reports vague numbment in right axilla      Coordination   Gross Motor Movements are Fluid and Coordinated No   limited by pain     Functional Tests   Functional tests Sit to Stand      Sit to Stand   Comments 8 reps in 30 seconds, RPE:7 felt weakness in her legs   slow     Posture/Postural Control   Posture/Postural Control  Postural limitations    Postural Limitations Rounded Shoulders;Forward head      AROM   AROM Assessment Site Cervical    Right Shoulder Extension 30 Degrees    Right Shoulder Flexion 110 Degrees    Right Shoulder ABduction 100 Degrees    Cervical Flexion 40    Cervical  Extension 30    Cervical - Right Side Bend 20    Cervical - Left Side Bend 30      Strength   Overall Strength Deficits    Overall Strength Comments pt reports she has a difficult time raising her arm due to pain.  She says this comes and goes and she has to use her arm at home.    Strength Assessment Site Hand    Right/Left hand Right;Left    Right Hand Grip (lbs) 2/3/5   difficult to hold dynamometer up   Left Hand Grip (lbs) 34/38/40      Palpation   Palpation comment tender muscle tightness in right lateral neck and upper trap.  Pt is hypersensitive to touch in right upper quadrant               LYMPHEDEMA/ONCOLOGY QUESTIONNAIRE - 10/06/21 0001       Right Upper Extremity Lymphedema   10 cm Proximal to Olecranon Process 34 cm    Olecranon Process 27.5 cm    10 cm Proximal to Ulnar Styloid Process 24 cm    Just Proximal to Ulnar Styloid Process 17 cm    Across Hand at [employer] 21 cm    At [place] of 2nd Digit 6.5 cm                   Quick Dash - 10/06/21 0001     Open a tight or new jar Moderate difficulty    Do heavy household chores (wash walls, wash floors) Unable    Carry a shopping bag or briefcase Moderate difficulty    Wash your back Unable    Use a knife to cut food Mild difficulty    Recreational activities in which you take some force or impact through your arm, shoulder, or hand (golf, hammering, tennis) Unable    During the past week, to what extent has your arm, shoulder or hand problem interfered with your normal social activities with family, friends, neighbors, or groups? Modererately    During the past week, to what extent has your arm, shoulder or hand problem  limited your work or other regular daily activities Modererately    Arm, shoulder, or hand pain. Severe    Tingling (pins and needles) in your arm, shoulder, or hand Moderate    Difficulty Sleeping So much difficuSo much difficulty, I can't sleep    DASH Score 68.18 %              Objective measurements completed on examination: See above findings.                  PT Short Term Goals - 10/06/21 1347       PT SHORT TERM GOAL #1   Title Pt will be able to have right shoulder flexion to > 125 degrees without pain so that she can perform her duties at home easider    Baseline 110 with pain on 10/06/2021    Time 4    Period Weeks    Status New      PT SHORT TERM GOAL #2   Title Pt will be independent in a beginning home exercise program for strength    Time 4    Period Weeks    Status New               PT Long Term Goals - 10/06/21 1349       PT LONG TERM GOAL #1  Title Pt will decrease quick DASH score to < 50 indicating and improvement in right UE function    Baseline 68.18 on 10/06/2021    Time 8    Period Weeks    Status New      PT LONG TERM GOAL #2   Title Pt will increase number of repetitions of sit to stand in 30 seconds to > 11 indicating an improvment in functional mobiliyt and strength    Baseline 8 on 10/06/2021    Time 8    Period Weeks    Status New      PT LONG TERM GOAL #3   Title Pt will increase grip strength of right hand to > 10 pounds so that she can perform daily tasks easier    Baseline < 5 pounds on 10/06/2021      PT LONG TERM GOAL #4   Title Pt will be independent in a progressive resistive exercise program that she can do  in the gym at her apartment complex    Time 8    Period Weeks    Status New                    Plan - 10/06/21 1127     Clinical Impression Statement Pt comes back to PT with increased pain and irritiation in right upper quadrant and neck.  She has increased pain, swelling, tenderness,  hypersensitivitiy to clothing (does not wear a bra and has difficulty getting her compression garments, difficulty sleeping and new diagnosis of DM2.  She reports she was doing much better when she was coming to PT. She wants to get some relieft from her pain and increase her general body exercises so she can manage herself better at home and try to get back to work    Comorbidities muliple surgeries, chemotherapy, new diagnosed DM2    Examination-Activity Limitations Reach Overhead;Lift;Carry;Caring for Others    Examination-Participation Restrictions Occupation;Cleaning;Laundry;Meal Prep    Stability/Clinical Decision Making Stable/Uncomplicated    Clinical Decision Making Low    Rehab Potential Good    PT Frequency 2x / week    PT Duration 8 weeks    PT Treatment/Interventions Therapeutic exercise;Patient/family education;Manual techniques;Manual lymph drainage;Scar mobilization;Passive range of motion;ADLs/Self Care Home Management;Orthotic Fit/Training;Vasopneumatic Device;Taping    PT Next Visit Plan Start with manual work to decrease pain in swelling in right upper quadrant and neck. Guide use of compression, progress exercise program, consider dry needling in neck pain does not respond to treatment    Consulted and Agree with Plan of Care Patient             Patient will benefit from skilled therapeutic intervention in order to improve the following deficits and impairments:  Postural dysfunction, Decreased range of motion, Pain, Impaired UE functional use, Decreased knowledge of precautions, Increased fascial restricitons, Decreased scar mobility, Increased edema, Decreased strength, Increased muscle spasms, Obesity, Impaired perceived functional ability, Decreased activity tolerance, Impaired flexibility  Visit Diagnosis: Lymphedema, not elsewhere classified - Plan: PT plan of care cert/re-cert  Disorder of the skin and subcutaneous tissue related to radiation, unspecified - Plan:  PT plan of care cert/re-cert  Aftercare following surgery for neoplasm - Plan: PT plan of care cert/re-cert  Stiffness of right shoulder, not elsewhere classified - Plan: PT plan of care cert/re-cert  Acute pain of right shoulder - Plan: PT plan of care cert/re-cert  Abnormal posture - Plan: PT plan of care cert/re-cert     Problem List  Patient Active Problem List   Diagnosis Date Noted   Pap smear for cervical cancer screening 08/19/2021   Bartholin cyst 05/06/2021   Amenorrhea, secondary 03/02/2021   PICC (peripherally inserted central catheter) in place 09/09/2020   Genetic testing 08/11/2020   Iron deficiency anemia due to chronic blood loss 08/04/2020   Family history of colon cancer    Malignant neoplasm of upper-outer quadrant of right breast in female, estrogen receptor positive ([place]) 07/28/2020   Donato Heinz. Owens Shark PT  Norwood Levo, PT 10/06/2021, 1:56 PM  [facility] @ [address] [place], [place], [address] Phone: [phone]   Fax:  [phone]  Name: Melody Rodriguez MRN: [id number] Date of Birth: 1976/03/11

## 2021-10-10 ENCOUNTER — Other Ambulatory Visit (HOSPITAL_COMMUNITY): Payer: Self-pay

## 2021-10-12 ENCOUNTER — Other Ambulatory Visit (HOSPITAL_COMMUNITY): Payer: Self-pay

## 2021-10-17 ENCOUNTER — Ambulatory Visit: Payer: Medicaid Other

## 2021-10-17 ENCOUNTER — Other Ambulatory Visit: Payer: Self-pay

## 2021-10-17 DIAGNOSIS — C50411 Malignant neoplasm of upper-outer quadrant of right female breast: Secondary | ICD-10-CM | POA: Diagnosis not present

## 2021-10-17 DIAGNOSIS — I890 Lymphedema, not elsewhere classified: Secondary | ICD-10-CM

## 2021-10-17 DIAGNOSIS — R293 Abnormal posture: Secondary | ICD-10-CM

## 2021-10-17 DIAGNOSIS — M25611 Stiffness of right shoulder, not elsewhere classified: Secondary | ICD-10-CM

## 2021-10-17 DIAGNOSIS — Z483 Aftercare following surgery for neoplasm: Secondary | ICD-10-CM

## 2021-10-17 DIAGNOSIS — L599 Disorder of the skin and subcutaneous tissue related to radiation, unspecified: Secondary | ICD-10-CM

## 2021-10-17 DIAGNOSIS — M25511 Pain in right shoulder: Secondary | ICD-10-CM

## 2021-10-17 NOTE — Therapy (Signed)
[facility] @ [address] [place], [place], [address] Phone: [phone]   Fax:  [phone]  Physical Therapy Treatment  Patient Details  Name: Melody Rodriguez MRN: [id number] Date of Birth: 11-02-1975 Referring Provider (PT): Wilber Bihari   Encounter Date: 10/17/2021   PT End of Session - 10/17/21 1109     Visit Number 2    Number of Visits 17    Date for PT Re-Evaluation 12/05/21    PT Start Time 1009    PT Stop Time 1109    PT Time Calculation (min) 60 min    Activity Tolerance Patient tolerated treatment well    Behavior During Therapy [facility] for tasks assessed/performed             Past Medical History:  Diagnosis Date   Breast cancer ([place])    Diabetes mellitus without complication ([place])    Family history of colon cancer    History of radiation therapy    right breast/scv  05/10/21-07/01/21  Dr Gery Pray    Past Surgical History:  Procedure Laterality Date   BREAST BIOPSY Right 02/18/2021   Procedure: EVACUATION HEMATOMA RIGHT AXILLA;  Surgeon: [name], MD;  Location: [facility];  Service: General;  Laterality: Right;   BREAST CYST EXCISION Right    Patient does not recall (2014 or 2015)   BREAST LUMPECTOMY WITH RADIOACTIVE SEED AND SENTINEL LYMPH NODE BIOPSY Right 02/07/2021   Procedure: RIGHT BREAST LUMPECTOMY WITH RADIOACTIVE SEED AND SEED TARGETED LYMPH NODE BIOPSY AND SENTINEL LYMPH NODE BIOPSY;  Surgeon: [name], MD;  Location: [facility];  Service: General;  Laterality: Right;   CESAREAN SECTION     IR IMAGING GUIDED PORT INSERTION  09/15/2020   IR REMOVAL TUN CV CATH W/O FL  09/15/2020   NODE DISSECTION Right 03/28/2021   Procedure: RIGHT AXILLARY LYMPH NODE DISSECTION;  Surgeon: [name], MD;  Location: [facility];  Service: General;  Laterality: Right;   PORT-A-CATH REMOVAL Left 02/07/2021   Procedure: REMOVAL PORT-A-CATH;   Surgeon: [name], MD;  Location: [facility];  Service: General;  Laterality: Left;   THYROIDECTOMY, PARTIAL      There were no vitals filed for this visit.   Subjective Assessment - 10/17/21 1017     Subjective Yesterday was a good day for my Rt arm. And my new compression sleeve and gauntlet but I haven't tried it on yet.    Pertinent History Patient was diagnosed on 07/13/2020 with right grade 2 invasive ductal carcinoma breast cancer. It is ER/PR positive and HER2 negative with a Ki67 of 20%. Patient underwent neoadjuvant chemotherapy from 09/01/2020 - 01/14/2021. She had a right lumpectomy and sentinel node biopsy (1/1 nodes positive) on 02/07/2021. Hematoma evacuation on 02/18/2021. Axillary dissection on 03/28/2021 with 3/7 positive nodes. Iron deficiency. Radiation  05/10/2021-06/2021  Diagnosis of Type 2 diabetes in Nov. 2022.  Pt has had 2 steroid shots in right shoulder with only about 2 hours of relief.  She was advised not to have any more steroid shots due to her Diabetes    Patient Stated Goals to get help with decreasing the pain in her arm and swelling and    Currently in Pain? Yes    Pain Score 7     Pain Location Shoulder    Pain Orientation Right    Pain Descriptors / Indicators Stabbing;Tightness;Aching    Pain Type Chronic pain  Pain Radiating Towards breast    Pain Onset More than a month ago    Pain Frequency Intermittent    Aggravating Factors  overusing arm    Pain Relieving Factors not sure                               [facility] Adult PT Treatment/Exercise - 10/17/21 0001       Manual Therapy   Soft tissue mobilization --    Manual Lymphatic Drainage (MLD) In Supine: Short neck, superficial and deep abdominals, Lt axillary nodes, anterior inter-axillary anastomosis, Rt inguinal nodes and Rt axillo-inguinal anastomosis, then focused on Rt breast and UE,working from proximal to distal then redirecting towards anastomosis     Passive ROM In Supine with Rt UE elevated on pillow: Rt shoulder into flexion and abduction to pts tolerance slowly and gently      Prosthetics   Prosthetic Care Comments  Assessed fit of new compression sleeve and gauntlet while educating pt in proper donning and doffing techniques. These are a good fit for pt and she reports this overall comfortable though she can tell it will take time for her to adjust to the new compression after wearing the TG soft sleeves for so long.                       PT Short Term Goals - 10/06/21 1347       PT SHORT TERM GOAL #1   Title Pt will be able to have right shoulder flexion to > 125 degrees without pain so that she can perform her duties at home easider    Baseline 110 with pain on 10/06/2021    Time 4    Period Weeks    Status New      PT SHORT TERM GOAL #2   Title Pt will be independent in a beginning home exercise program for strength    Time 4    Period Weeks    Status New               PT Long Term Goals - 10/06/21 1349       PT LONG TERM GOAL #1   Title Pt will decrease quick DASH score to < 50 indicating and improvement in right UE function    Baseline 68.18 on 10/06/2021    Time 8    Period Weeks    Status New      PT LONG TERM GOAL #2   Title Pt will increase number of repetitions of sit to stand in 30 seconds to > 11 indicating an improvment in functional mobiliyt and strength    Baseline 8 on 10/06/2021    Time 8    Period Weeks    Status New      PT LONG TERM GOAL #3   Title Pt will increase grip strength of right hand to > 10 pounds so that she can perform daily tasks easier    Baseline < 5 pounds on 10/06/2021      PT LONG TERM GOAL #4   Title Pt will be independent in a progressive resistive exercise program that she can do  in the gym at her apartment complex    Time [place]aut - 10/17/21  1110     Clinical Impression Statement Pt brought  her neew compression sleeve and gauntlet. These are both an excellent fit for pt and though she reports it will take her time to adjust to the compression, she can tell they are a good fit. Instructed her how to increase tolerance to wearing these by starting with 2-3 hours/day and increasing wear time of these and compression bra as able. Pt able to verbalilze good understanding. Then resumed manual lymph drainage of Rt breast and UE. Her breast with peau de l'orange present with palapble firmness where tumor was so focused here. P/ROM of Rt shoulder performed slowly and gently to pts tolerance. Did not have time to incorporate STM to Rt upper trap/neck due to instruction in donning/doffing new compression garments.    Personal Factors and Comorbidities Comorbidity 3+    Comorbidities muliple surgeries, chemotherapy, new diagnosed DM2    Examination-Activity Limitations Reach Overhead;Lift;Carry;Caring for Others    Examination-Participation Restrictions Occupation;Cleaning;Laundry;Meal Prep    Stability/Clinical Decision Making Stable/Uncomplicated    Rehab Potential Good    PT Frequency 2x / week    PT Duration 8 weeks    PT Treatment/Interventions Therapeutic exercise;Patient/family education;Manual techniques;Manual lymph drainage;Scar mobilization;Passive range of motion;ADLs/Self Care Home Management;Orthotic Fit/Training;Vasopneumatic Device;Taping    PT Next Visit Plan Cont manual work to decrease pain in swelling in right upper quadrant and neck. How is use of compression going?, progress exercise program, consider dry needling in neck pain does not respond to treatment    Consulted and Agree with Plan of Care Patient             Patient will benefit from skilled therapeutic intervention in order to improve the following deficits and impairments:  Postural dysfunction, Decreased range of motion, Pain, Impaired UE functional use, Decreased knowledge of precautions, Increased fascial  restricitons, Decreased scar mobility, Increased edema, Decreased strength, Increased muscle spasms, Obesity, Impaired perceived functional ability, Decreased activity tolerance, Impaired flexibility  Visit Diagnosis: Lymphedema, not elsewhere classified  Disorder of the skin and subcutaneous tissue related to radiation, unspecified  Aftercare following surgery for neoplasm  Stiffness of right shoulder, not elsewhere classified  Acute pain of right shoulder  Abnormal posture     Problem List Patient Active Problem List   Diagnosis Date Noted   Pap smear for cervical cancer screening 08/19/2021   Bartholin cyst 05/06/2021   Amenorrhea, secondary 03/02/2021   PICC (peripherally inserted central catheter) in place 09/09/2020   Genetic testing 08/11/2020   Iron deficiency anemia due to chronic blood loss 08/04/2020   Family history of colon cancer    Malignant neoplasm of upper-outer quadrant of right breast in female, estrogen receptor positive ([place]) 07/28/2020    Otelia Limes, PTA 10/17/2021, 11:16 AM  [facility] @ [address] [place], [place], [address] Phone: [phone]   Fax:  [phone]  Name: Melody Rodriguez MRN: [id number] Date of Birth: 10-26-1975

## 2021-10-20 ENCOUNTER — Encounter: Payer: Medicaid Other | Admitting: Physical Therapy

## 2021-10-20 ENCOUNTER — Encounter: Payer: Self-pay | Admitting: Hematology and Oncology

## 2021-10-20 ENCOUNTER — Other Ambulatory Visit (HOSPITAL_COMMUNITY): Payer: Self-pay

## 2021-10-24 ENCOUNTER — Inpatient Hospital Stay: Payer: Medicaid Other | Attending: Hematology and Oncology

## 2021-10-24 ENCOUNTER — Other Ambulatory Visit: Payer: Self-pay

## 2021-10-24 VITALS — BP 127/69 | HR 87 | Temp 98.9°F | Resp 18

## 2021-10-24 DIAGNOSIS — Z5111 Encounter for antineoplastic chemotherapy: Secondary | ICD-10-CM | POA: Insufficient documentation

## 2021-10-24 DIAGNOSIS — Z170 Estrogen receptor positive status [ER+]: Secondary | ICD-10-CM | POA: Diagnosis not present

## 2021-10-24 DIAGNOSIS — C773 Secondary and unspecified malignant neoplasm of axilla and upper limb lymph nodes: Secondary | ICD-10-CM | POA: Diagnosis present

## 2021-10-24 DIAGNOSIS — C50411 Malignant neoplasm of upper-outer quadrant of right female breast: Secondary | ICD-10-CM

## 2021-10-24 MED ORDER — GOSERELIN ACETATE 3.6 MG ~~LOC~~ IMPL
3.6000 mg | DRUG_IMPLANT | Freq: Once | SUBCUTANEOUS | Status: AC
  Administered 2021-10-24: 3.6 mg via SUBCUTANEOUS
  Filled 2021-10-24: qty 3.6

## 2021-10-24 NOTE — Patient Instructions (Signed)
Goserelin injection °What is this medication? °GOSERELIN (GOE se rel in) is similar to a hormone found in the body. It lowers the amount of sex hormones that the body makes. Men will have lower testosterone levels and women will have lower estrogen levels while taking this medicine. In men, this medicine is used to treat prostate cancer; the injection is either given once per month or once every 12 weeks. A once per month injection (only) is used to treat women with endometriosis, dysfunctional uterine bleeding, or advanced breast cancer. °This medicine may be used for other purposes; ask your health care provider or pharmacist if you have questions. °COMMON BRAND NAME(S): Zoladex, Zoladex 3-Month °What should I tell my care team before I take this medication? °They need to know if you have any of these conditions: °bone problems °diabetes °heart disease °history of irregular heartbeat °an unusual or allergic reaction to goserelin, other medicines, foods, dyes, or preservatives °pregnant or trying to get pregnant °breast-feeding °How should I use this medication? °This medicine is for injection under the skin. It is given by a health care professional in a hospital or clinic setting. °Talk to your pediatrician regarding the use of this medicine in children. Special care may be needed. °Overdosage: If you think you have taken too much of this medicine contact a poison control center or emergency room at once. °NOTE: This medicine is only for you. Do not share this medicine with others. °What if I miss a dose? °It is important not to miss your dose. Call your doctor or health care professional if you are unable to keep an appointment. °What may interact with this medication? °Do not take this medicine with any of the following medications: °cisapride °dronedarone °pimozide °thioridazine °This medicine may also interact with the following medications: °other medicines that prolong the QT interval (an abnormal heart  rhythm) °This list may not describe all possible interactions. Give your health care provider a list of all the medicines, herbs, non-prescription drugs, or dietary supplements you use. Also tell them if you smoke, drink alcohol, or use illegal drugs. Some items may interact with your medicine. °What should I watch for while using this medication? °Visit your doctor or health care provider for regular checks on your progress. Your symptoms may appear to get worse during the first weeks of this therapy. Tell your doctor or healthcare provider if your symptoms do not start to get better or if they get worse after this time. °Your bones may get weaker if you take this medicine for a long time. If you smoke or frequently drink alcohol you may increase your risk of bone loss. A family history of osteoporosis, chronic use of drugs for seizures (convulsions), or corticosteroids can also increase your risk of bone loss. Talk to your doctor about how to keep your bones strong. °This medicine should stop regular monthly menstruation in women. Tell your doctor if you continue to menstruate. °Women should not become pregnant while taking this medicine or for 12 weeks after stopping this medicine. Women should inform their doctor if they wish to become pregnant or think they might be pregnant. There is a potential for serious side effects to an unborn child. Talk to your health care professional or pharmacist for more information. Do not breast-feed an infant while taking this medicine. °Men should inform their doctors if they wish to father a child. This medicine may lower sperm counts. Talk to your health care professional or pharmacist for more information. °This   medicine may increase blood sugar. Ask your healthcare provider if changes in diet or medicines are needed if you have diabetes. °What side effects may I notice from receiving this medication? °Side effects that you should report to your doctor or health care  professional as soon as possible: °allergic reactions like skin rash, itching or hives, swelling of the face, lips, or tongue °bone pain °breathing problems °changes in vision °chest pain °feeling faint or lightheaded, falls °fever, chills °pain, swelling, warmth in the leg °pain, tingling, numbness in the hands or feet °signs and symptoms of high blood sugar such as being more thirsty or hungry or having to urinate more than normal. You may also feel very tired or have blurry vision °signs and symptoms of low blood pressure like dizziness; feeling faint or lightheaded, falls; unusually weak or tired °stomach pain °swelling of the ankles, feet, hands °trouble passing urine or change in the amount of urine °unusually high or low blood pressure °unusually weak or tired °Side effects that usually do not require medical attention (report to your doctor or health care professional if they continue or are bothersome): °change in sex drive or performance °changes in breast size in both males and females °changes in emotions or moods °headache °hot flashes °irritation at site where injected °loss of appetite °skin problems like acne, dry skin °vaginal dryness °This list may not describe all possible side effects. Call your doctor for medical advice about side effects. You may report side effects to FDA at 1-800-FDA-1088. °Where should I keep my medication? °This drug is given in a hospital or clinic and will not be stored at home. °NOTE: This sheet is a summary. It may not cover all possible information. If you have questions about this medicine, talk to your doctor, pharmacist, or health care provider. °© 2022 [employer] (2019-01-03 00:00:00) ° °

## 2021-10-25 ENCOUNTER — Other Ambulatory Visit (HOSPITAL_COMMUNITY): Payer: Self-pay

## 2021-10-26 ENCOUNTER — Other Ambulatory Visit: Payer: Self-pay

## 2021-10-26 ENCOUNTER — Ambulatory Visit: Payer: Medicaid Other | Attending: Adult Health

## 2021-10-26 DIAGNOSIS — L599 Disorder of the skin and subcutaneous tissue related to radiation, unspecified: Secondary | ICD-10-CM

## 2021-10-26 DIAGNOSIS — Z483 Aftercare following surgery for neoplasm: Secondary | ICD-10-CM

## 2021-10-26 DIAGNOSIS — M25611 Stiffness of right shoulder, not elsewhere classified: Secondary | ICD-10-CM | POA: Diagnosis present

## 2021-10-26 DIAGNOSIS — I890 Lymphedema, not elsewhere classified: Secondary | ICD-10-CM | POA: Diagnosis not present

## 2021-10-26 DIAGNOSIS — M25511 Pain in right shoulder: Secondary | ICD-10-CM

## 2021-10-26 DIAGNOSIS — R293 Abnormal posture: Secondary | ICD-10-CM

## 2021-10-26 NOTE — Therapy (Signed)
[facility] @ [address] [place], [place], [address] Phone: [phone]   Fax:  [phone]  Physical Therapy Treatment  Patient Details  Name: Melody Rodriguez MRN: [id number] Date of Birth: 1975-12-15 Referring Provider (PT): Wilber Bihari   Encounter Date: 10/26/2021   PT End of Session - 10/26/21 1005     Visit Number 3    Number of Visits 17    Date for PT Re-Evaluation 12/05/21    Authorization Type [employer] - needs auth after 20 visits    PT Start Time 0905    PT Stop Time 1006    PT Time Calculation (min) 61 min    Activity Tolerance Patient tolerated treatment well;Patient limited by pain    Behavior During Therapy [facility] for tasks assessed/performed             Past Medical History:  Diagnosis Date   Breast cancer ([place])    Diabetes mellitus without complication ([place])    Family history of colon cancer    History of radiation therapy    right breast/scv  05/10/21-07/01/21  Dr Gery Pray    Past Surgical History:  Procedure Laterality Date   BREAST BIOPSY Right 02/18/2021   Procedure: EVACUATION HEMATOMA RIGHT AXILLA;  Surgeon: [name], MD;  Location: [facility];  Service: General;  Laterality: Right;   BREAST CYST EXCISION Right    Patient does not recall (2014 or 2015)   BREAST LUMPECTOMY WITH RADIOACTIVE SEED AND SENTINEL LYMPH NODE BIOPSY Right 02/07/2021   Procedure: RIGHT BREAST LUMPECTOMY WITH RADIOACTIVE SEED AND SEED TARGETED LYMPH NODE BIOPSY AND SENTINEL LYMPH NODE BIOPSY;  Surgeon: [name], MD;  Location: [facility];  Service: General;  Laterality: Right;   CESAREAN SECTION     IR IMAGING GUIDED PORT INSERTION  09/15/2020   IR REMOVAL TUN CV CATH W/O FL  09/15/2020   NODE DISSECTION Right 03/28/2021   Procedure: RIGHT AXILLARY LYMPH NODE DISSECTION;  Surgeon: [name], MD;  Location: [facility];  Service: General;   Laterality: Right;   PORT-A-CATH REMOVAL Left 02/07/2021   Procedure: REMOVAL PORT-A-CATH;  Surgeon: [name], MD;  Location: [facility];  Service: General;  Laterality: Left;   THYROIDECTOMY, PARTIAL      There were no vitals filed for this visit.   Subjective Assessment - 10/26/21 0910     Subjective I had to take my son to [facility] yesterday for an appt and my arm is tired and hurting more tired maybe just from having to sit around waiting and driving more yesterday. I've been wearing my compression sleeve but can only wear it fora few hours before it feels like it's getting tighter at my upper arm. The gauntlet is tighter than the sleeve so I wear that less.    Pertinent History Patient was diagnosed on 07/13/2020 with right grade 2 invasive ductal carcinoma breast cancer. It is ER/PR positive and HER2 negative with a Ki67 of 20%. Patient underwent neoadjuvant chemotherapy from 09/01/2020 - 01/14/2021. She had a right lumpectomy and sentinel node biopsy (1/1 nodes positive) on 02/07/2021. Hematoma evacuation on 02/18/2021. Axillary dissection on 03/28/2021 with 3/7 positive nodes. Iron deficiency. Radiation  05/10/2021-06/2021  Diagnosis of Type 2 diabetes in Nov. 2022.  Pt has had 2 steroid shots in right shoulder with only about 2 hours of relief.  She was advised not to have any more steroid shots due to her Diabetes  Patient Stated Goals to get help with decreasing the pain in her arm and swelling    Currently in Pain? Yes    Pain Score 10-Worst pain ever    Pain Location Shoulder    Pain Orientation Right    Pain Descriptors / Indicators Stabbing   pulling   Pain Type Chronic pain    Pain Onset More than a month ago    Pain Frequency Constant    Aggravating Factors  overusing arm and when it gets stiff    Pain Relieving Factors not sure, have good days and bad days                   LYMPHEDEMA/ONCOLOGY QUESTIONNAIRE - 10/26/21 0001       Right Upper  Extremity Lymphedema   10 cm Proximal to Olecranon Process 32 cm    Olecranon Process 26 cm    10 cm Proximal to Ulnar Styloid Process 23.6 cm    Just Proximal to Ulnar Styloid Process 17.1 cm    Across Hand at [employer] 21.1 cm    At [place] of 2nd Digit 6.6 cm                        [facility] Adult PT Treatment/Exercise - 10/26/21 0001       Manual Therapy   Manual Lymphatic Drainage (MLD) In Supine: Short neck, superficial and deep abdominals, Lt axillary nodes, anterior inter-axillary anastomosis, Rt inguinal nodes and Rt axillo-inguinal anastomosis, then focused on Rt breast and UE,working from proximal to distal then redirecting towards anastomosis    Passive ROM In Supine with Rt UE elevated on pillow: Rt shoulder into flexion and scaption to pts tolerance slowly and gently                       PT Short Term Goals - 10/06/21 1347       PT SHORT TERM GOAL #1   Title Pt will be able to have right shoulder flexion to > 125 degrees without pain so that she can perform her duties at home easider    Baseline 110 with pain on 10/06/2021    Time 4    Period Weeks    Status New      PT SHORT TERM GOAL #2   Title Pt will be independent in a beginning home exercise program for strength    Time 4    Period Weeks    Status New               PT Long Term Goals - 10/06/21 1349       PT LONG TERM GOAL #1   Title Pt will decrease quick DASH score to < 50 indicating and improvement in right UE function    Baseline 68.18 on 10/06/2021    Time 8    Period Weeks    Status New      PT LONG TERM GOAL #2   Title Pt will increase number of repetitions of sit to stand in 30 seconds to > 11 indicating an improvment in functional mobiliyt and strength    Baseline 8 on 10/06/2021    Time 8    Period Weeks    Status New      PT LONG TERM GOAL #3   Title Pt will increase grip strength of right hand to > 10 pounds so that she can perform daily tasks easier  Baseline < 5 pounds on 10/06/2021      PT LONG TERM GOAL #4   Title Pt will be independent in a progressive resistive exercise program that she can do  in the gym at her apartment complex    Time 8    Period Weeks    Status New                   Plan - 10/26/21 1006     Clinical Impression Statement Pt comes in with increased Rt shoulder pain today from long day with her son yesterday traveling to [place] for his appt for his autism. So today focused on MLD and very gentle Rt shoulder P/ROM working within pts tolerane which is limited by her Rt shoulder pain. She reports her pain is still about the same after sessoin but her mobility does improve by having increased P/ROM towards end of session. Remeasured her circumference which has improved since last time measured. Educated her about the importance of wearing her compression gauntlet with her sleeve to prevent dorsal hand swelling and ways she can work to stretch the thumb of her gauntlet and pt verbalized understanding.    Personal Factors and Comorbidities Comorbidity 3+    Comorbidities muliple surgeries, chemotherapy, new diagnosed DM2    Examination-Activity Limitations Reach Overhead;Lift;Carry;Caring for Others    Examination-Participation Restrictions Occupation;Cleaning;Laundry;Meal Prep    Stability/Clinical Decision Making Stable/Uncomplicated    Rehab Potential Good    PT Frequency 2x / week    PT Duration 8 weeks    PT Treatment/Interventions Therapeutic exercise;Patient/family education;Manual techniques;Manual lymph drainage;Scar mobilization;Passive range of motion;ADLs/Self Care Home Management;Orthotic Fit/Training;Vasopneumatic Device;Taping    PT Next Visit Plan Cont manual work to decrease pain in swelling in right upper quadrant and neck. How is use of compression going?, progress exercise program, consider dry needling in neck pain does not respond to treatment    PT Home Exercise Plan Post op shoulder ROM  HEP, supine dowel exercises; isometrics for Rt shoulder    Consulted and Agree with Plan of Care Patient             Patient will benefit from skilled therapeutic intervention in order to improve the following deficits and impairments:  Postural dysfunction, Decreased range of motion, Pain, Impaired UE functional use, Decreased knowledge of precautions, Increased fascial restricitons, Decreased scar mobility, Increased edema, Decreased strength, Increased muscle spasms, Obesity, Impaired perceived functional ability, Decreased activity tolerance, Impaired flexibility  Visit Diagnosis: Lymphedema, not elsewhere classified  Disorder of the skin and subcutaneous tissue related to radiation, unspecified  Aftercare following surgery for neoplasm  Stiffness of right shoulder, not elsewhere classified  Acute pain of right shoulder  Abnormal posture     Problem List Patient Active Problem List   Diagnosis Date Noted   Pap smear for cervical cancer screening 08/19/2021   Bartholin cyst 05/06/2021   Amenorrhea, secondary 03/02/2021   PICC (peripherally inserted central catheter) in place 09/09/2020   Genetic testing 08/11/2020   Iron deficiency anemia due to chronic blood loss 08/04/2020   Family history of colon cancer    Malignant neoplasm of upper-outer quadrant of right breast in female, estrogen receptor positive ([place]) 07/28/2020    Otelia Limes, PTA 10/26/2021, 10:09 AM  [facility] @ [address] [place], [place], [address] Phone: [phone]   Fax:  [phone]  Name: KEA CALLAN MRN: [id number] Date of Birth: 24-Mar-1976

## 2021-11-01 ENCOUNTER — Other Ambulatory Visit: Payer: Self-pay

## 2021-11-01 ENCOUNTER — Ambulatory Visit: Payer: Medicaid Other

## 2021-11-01 VITALS — Wt 220.1 lb

## 2021-11-01 DIAGNOSIS — L599 Disorder of the skin and subcutaneous tissue related to radiation, unspecified: Secondary | ICD-10-CM

## 2021-11-01 DIAGNOSIS — I890 Lymphedema, not elsewhere classified: Secondary | ICD-10-CM | POA: Diagnosis not present

## 2021-11-01 DIAGNOSIS — M25611 Stiffness of right shoulder, not elsewhere classified: Secondary | ICD-10-CM

## 2021-11-01 DIAGNOSIS — M25511 Pain in right shoulder: Secondary | ICD-10-CM

## 2021-11-01 DIAGNOSIS — R293 Abnormal posture: Secondary | ICD-10-CM

## 2021-11-01 DIAGNOSIS — Z483 Aftercare following surgery for neoplasm: Secondary | ICD-10-CM

## 2021-11-01 NOTE — Therapy (Signed)
[facility] [place], [place], [address] Phone: [phone]   Fax:  [phone]  Physical Therapy Treatment  Patient Details  Name: Melody Rodriguez MRN: [id number] Date of Birth: Nov 30, 1975 Referring Provider (PT): Wilber Bihari   Encounter Date: 11/01/2021   PT End of Session - 11/01/21 1148     Visit Number 4    Number of Visits 17    Date for PT Re-Evaluation 12/05/21    Authorization Type [employer] - needs auth after 20 visits    PT Start Time 0906    PT Stop Time 1002    PT Time Calculation (min) 56 min    Activity Tolerance Patient tolerated treatment well;Patient limited by pain             Past Medical History:  Diagnosis Date   Breast cancer ([place])    Diabetes mellitus without complication ([place])    Family history of colon cancer    History of radiation therapy    right breast/scv  05/10/21-07/01/21  Dr Gery Pray    Past Surgical History:  Procedure Laterality Date   BREAST BIOPSY Right 02/18/2021   Procedure: EVACUATION HEMATOMA RIGHT AXILLA;  Surgeon: [name], MD;  Location: [facility];  Service: General;  Laterality: Right;   BREAST CYST EXCISION Right    Patient does not recall (2014 or 2015)   BREAST LUMPECTOMY WITH RADIOACTIVE SEED AND SENTINEL LYMPH NODE BIOPSY Right 02/07/2021   Procedure: RIGHT BREAST LUMPECTOMY WITH RADIOACTIVE SEED AND SEED TARGETED LYMPH NODE BIOPSY AND SENTINEL LYMPH NODE BIOPSY;  Surgeon: [name], MD;  Location: [facility];  Service: General;  Laterality: Right;   CESAREAN SECTION     IR IMAGING GUIDED PORT INSERTION  09/15/2020   IR REMOVAL TUN CV CATH W/O FL  09/15/2020   NODE DISSECTION Right 03/28/2021   Procedure: RIGHT AXILLARY LYMPH NODE DISSECTION;  Surgeon: [name], MD;  Location: [facility];  Service: General;  Laterality: Right;   PORT-A-CATH REMOVAL Left 02/07/2021    Procedure: REMOVAL PORT-A-CATH;  Surgeon: [name], MD;  Location: [facility];  Service: General;  Laterality: Left;   THYROIDECTOMY, PARTIAL      Vitals:   11/01/21 0917  Weight: 220 lb 2 oz (99.8 kg)     Subjective Assessment - 11/01/21 0912     Subjective My Rt shoulder isn't hurting as bad today as it was last time.    Currently in Pain? Yes    Pain Score 8     Pain Location Shoulder    Pain Orientation Right    Pain Descriptors / Indicators --   aggravating   Pain Type Chronic pain    Pain Onset More than a month ago    Pain Frequency Constant    Aggravating Factors  overusing arm and when it gets stiff    Pain Relieving Factors I just have good and bad days                    L-DEX FLOWSHEETS - 11/01/21 0900       L-DEX LYMPHEDEMA SCREENING   Measurement Type Unilateral    L-DEX MEASUREMENT EXTREMITY Upper Extremity    POSITION  Standing    DOMINANT SIDE Left    At Risk Side Left    BASELINE SCORE (UNILATERAL) 0.4    L-DEX SCORE (UNILATERAL) -9.2    VALUE CHANGE (UNILAT) -9.6  [facility] PT Treatment/Exercise - 11/01/21 0001       Shoulder Exercises: Pulleys   Flexion 2 minutes    Flexion Limitations VCs to relax Rt shoulder    Scaption 2 minutes    Scaption Limitations VCs to relax Rt shoulder and not lean      Manual Therapy   Manual Lymphatic Drainage (MLD) In Supine: Short neck, superficial and deep abdominals, Lt axillary nodes, anterior inter-axillary anastomosis, Rt inguinal nodes and Rt axillo-inguinal anastomosis, then focused on Rt breast and UE, working from proximal to distal then redirecting towards anastomosis    Passive ROM In Supine with Rt UE elevated on pillow: Rt shoulder into flexion and scaption to pts tolerance slowly and gently with scapular depression throughout                       PT Short Term Goals - 10/06/21 1347       PT SHORT TERM GOAL #1    Title Pt will be able to have right shoulder flexion to > 125 degrees without pain so that she can perform her duties at home easider    Baseline 110 with pain on 10/06/2021    Time 4    Period Weeks    Status New      PT SHORT TERM GOAL #2   Title Pt will be independent in a beginning home exercise program for strength    Time 4    Period Weeks    Status New               PT Long Term Goals - 10/06/21 1349       PT LONG TERM GOAL #1   Title Pt will decrease quick DASH score to < 50 indicating and improvement in right UE function    Baseline 68.18 on 10/06/2021    Time 8    Period Weeks    Status New      PT LONG TERM GOAL #2   Title Pt will increase number of repetitions of sit to stand in 30 seconds to > 11 indicating an improvment in functional mobiliyt and strength    Baseline 8 on 10/06/2021    Time 8    Period Weeks    Status New      PT LONG TERM GOAL #3   Title Pt will increase grip strength of right hand to > 10 pounds so that she can perform daily tasks easier    Baseline < 5 pounds on 10/06/2021      PT LONG TERM GOAL #4   Title Pt will be independent in a progressive resistive exercise program that she can do  in the gym at her apartment complex    Time 8    Period Weeks    Status New                   Plan - 11/01/21 1150     Clinical Impression Statement Pt reports it was a "good day" for her Rt shoulder pain being some less. She was able to tolerate the pulleys with mild discomfort reported. Then continued with MLD to Rt upper arm along with Rt shoulder gentle P/ROM. 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 Also reassessed pts SOZO score and her change from baseline of -9.6 is WNLs so will cont every 3  month screens for this.    Personal Factors and Comorbidities Comorbidity 3+    Comorbidities muliple surgeries, chemotherapy, new diagnosed  DM2    Examination-Activity Limitations Reach Overhead;Lift;Carry;Caring for Others    Examination-Participation Restrictions Occupation;Cleaning;Laundry;Meal Prep    Stability/Clinical Decision Making Stable/Uncomplicated    Rehab Potential Good    PT Frequency 2x / week    PT Duration 8 weeks    PT Treatment/Interventions Therapeutic exercise;Patient/family education;Manual techniques;Manual lymph drainage;Scar mobilization;Passive range of motion;ADLs/Self Care Home Management;Orthotic Fit/Training;Vasopneumatic Device;Taping    PT Next Visit Plan Review isometric Rt shoulder strengthening; Cont manual work to decrease pain in swelling in right upper quadrant and neck. How is use of compression going?, progress exercise program, consider dry needling in neck pain does not respond to treatment    PT Home Exercise Plan Post op shoulder ROM HEP, supine dowel exercises; isometrics for Rt shoulder    Consulted and Agree with Plan of Care Patient             Patient will benefit from skilled therapeutic intervention in order to improve the following deficits and impairments:  Postural dysfunction, Decreased range of motion, Pain, Impaired UE functional use, Decreased knowledge of precautions, Increased fascial restricitons, Decreased scar mobility, Increased edema, Decreased strength, Increased muscle spasms, Obesity, Impaired perceived functional ability, Decreased activity tolerance, Impaired flexibility  Visit Diagnosis: Lymphedema, not elsewhere classified  Disorder of the skin and subcutaneous tissue related to radiation, unspecified  Aftercare following surgery for neoplasm  Stiffness of right shoulder, not elsewhere classified  Acute pain of right shoulder  Abnormal posture     Problem List Patient Active Problem List   Diagnosis Date Noted   Pap smear for cervical cancer screening 08/19/2021   Bartholin cyst 05/06/2021   Amenorrhea, secondary 03/02/2021   PICC  (peripherally inserted central catheter) in place 09/09/2020   Genetic testing 08/11/2020   Iron deficiency anemia due to chronic blood loss 08/04/2020   Family history of colon cancer    Malignant neoplasm of upper-outer quadrant of right breast in female, estrogen receptor positive ([place]) 07/28/2020    Otelia Limes, PTA 11/01/2021, 11:58 AM  [facility] @ [place], [place], [address] Phone: [phone]   Fax:  [phone]  Name: Melody Rodriguez MRN: [id number] Date of Birth: 1976-01-20

## 2021-11-02 NOTE — Progress Notes (Signed)
Radiation Oncology         [phone] ________________________________  Name: Melody Rodriguez MRN: [id number]  Date: 11/03/2021  DOB: 18-Jan-1976  Follow-Up Visit Note  CC: Patient, [name]  Nicholas Lose, MD    ICD-10-CM   1. Malignant neoplasm of upper-outer quadrant of right breast in female, estrogen receptor positive ([place])  C50.411    Z17.0       Diagnosis:  Stage IIA (cT2,cN1, cM0) Right Breast UOQ, Invasive Carcinoma with DCIS, ER+ / PR+ / Her2-, Grade 2  Interval Since Last Radiation: 4 months and 2 days   Intent: Curative  Radiation Treatment Dates: 05/10/2021 through 07/01/2021 Site Technique Total Dose (Gy) Dose per Fx (Gy) Completed Fx Beam Energies  Breast, Right: Breast_Rt 3D 50.4/50.4 1.8 28/28 10X  Breast, Right: Breast_Rt_Bst specialPort 12/12 2 6/6 15E  Sclav-RT: SCV_Rt 3D 50.4/50.4 1.8 28/28 6X, 10X    Narrative:  The patient returns today for routine follow-up, she was last seen here for follow up on 08/04/21. Since her last visit, the patient met with Dr. Lindi Adie on 08/18/21. During which time, the patient reported ongoing leg pains despite stopping letrozole. Considering this, Dr. Lindi Adie recommended restarting letrozole. Per Dr. Lindi Adie, her leg pains are likely attributed to diabetes complications (newly diagnosed).              The patient followed up with Wilber Bihari NP on 08/29/21 to review her survivorship care plan. During this visit, the patient reported continued right arm lymphedema, for which she wears a sleeve which helps her well, some right shoulder issues (underwent an injection for this which somewhat helped her), continued leg pain, a new rash, and trouble managing her blood sugar. The patient also reported some financial concerns and was referred to a Education officer, museum. She was otherwise instructed to continue on zoladex every 3 months.             On 09/04/21, the patient presented to the ED with the flu symptoms x2 days including  congestion, body aches, malaise, fatigue, decreased appetite, and cough. Following evaluation, she did not require hospital treatment and was discharged home with Tamiflu, Zofran, Tessalon, and Promethazine DM.   Given ongoing right shoulder issues, MRI of the right shoulder was ordered by Dr. [name] (ortho) on 09/16/21 which demonstrated mild distal infraspinatus tendinosis. No evidence of rotator cuff tear or labral tear appreciated. MRI also showed faint intramuscular edema within the teres minor, consistent with low-grade muscle strain, as well as mild soft tissue edema within the right axilla compatible with history of prior axillary lymph node dissection.  Dr. [name] has prescribed her an anti-inflammatory for this, and her PCP has also prescribed a muscle relaxant.   Pertinent imaging thus far includes:  --Diagnostic bilateral mammogram and right breast ultrasound  on 09/23/21 demonstrated skin thickening and parenchymal and trabecular thickening in the right breast, favored to represent post lumpectomy and radiation changes. Physical exam performed at the time of imaging revealed mild skin discoloration involving the upper-outer right breast.  She has continued to have problems with lymphedema in her breast and arm.  She is undergoing physical therapy at this time for this issue.  She will present to physical therapy again later today.  She denies any nipple discharge or bleeding.  She has chronic tenderness in the breast from lymphedema.  Allergies:  is allergic to cephalexin, latex, and naproxen.  Meds: Current Outpatient Medications  Medication Sig Dispense Refill   ACCU-CHEK GUIDE  test strip USE TO MONITOR BLOOD GLUCOSE 3 TIME(S) DAILY     Accu-Chek Softclix Lancets lancets 3 (three) times daily.     acetaminophen (TYLENOL) 325 MG tablet Take by mouth.     atorvastatin (LIPITOR) 10 MG tablet Take one tablet (10 mg dose) by mouth daily. 30 tablet 5   Blood Glucose Monitoring Suppl  (ACCU-CHEK GUIDE ME) w/Device KIT See admin instructions.     fexofenadine (ALLEGRA) 180 MG tablet Take by mouth.     fluticasone (FLONASE) 50 MCG/ACT nasal spray      glipiZIDE (GLUCOTROL) 5 MG tablet Take one tablet (5 mg dose) by mouth 2 (two) times daily. 60 tablet 5   letrozole (FEMARA) 2.5 MG tablet Take 1 tablet (2.5 mg total) by mouth daily. 90 tablet 3   oxyCODONE (OXY IR/ROXICODONE) 5 MG immediate release tablet Take 1-2 tablets (5-10 mg total) by mouth every 6 (six) hours as needed for moderate pain, severe pain or breakthrough pain. 30 tablet 0   pioglitazone (ACTOS) 15 MG tablet Take one tablet (15 mg dose) by mouth daily. 30 tablet 5   promethazine-dextromethorphan (PROMETHAZINE-DM) 6.25-15 MG/5ML syrup Take 5 mLs by mouth 4 (four) times daily as needed for cough. 118 mL 0   triamcinolone cream (KENALOG) 0.1 % Apply topically 2 (two) times a day as needed. 40 g 1   benzonatate (TESSALON) 100 MG capsule Take 1 capsule (100 mg total) by mouth every 8 (eight) hours. 21 capsule 0   meloxicam (MOBIC) 15 MG tablet Take 1 tablet  by mouth daily as needed for pain. (Patient not taking: Reported on 11/03/2021) 30 tablet 3   metaxalone (SKELAXIN) 400 MG tablet Take 1 tablet by mouth daily as needed. (Patient not taking: Reported on 11/03/2021) 30 tablet 0   ondansetron (ZOFRAN-ODT) 8 MG disintegrating tablet Take 1 tablet (8 mg total) by mouth every 8 (eight) hours as needed for nausea or vomiting. 20 tablet 0   No current facility-administered medications for this encounter.    Physical Findings: The patient is in no acute distress. Patient is alert and oriented.  height is _0  (1.626 m) and weight is 217 lb 8 oz (98.7 kg). Her temporal temperature is 97.1 F (36.2 C) (abnormal). Her blood pressure is 112/60 and her pulse is 81. Her respiration is 18 and oxygen saturation is 100%. .  Lungs are clear to auscultation bilaterally. Heart has regular rate and rhythm. No palpable cervical,  supraclavicular, or axillary adenopathy. Abdomen soft, non-tender, normal bowel sounds. Left breast: No palpable mass nipple discharge or bleeding.  Right breast shows hyperpigmentation changes and is quite firm consistent with diffuse lymphedema.  No dominant mass appreciated in the breast nipple discharge or bleeding.  She has a lymphedema sleeve present along the right arm.  Lab Findings: Lab Results  Component Value Date   WBC 5.0 09/26/2021   HGB 12.8 09/26/2021   HCT 38.6 09/26/2021   MCV 90.0 09/26/2021   PLT 237 09/26/2021    Radiographic Findings: No results found.  Impression: Stage IIA (cT2,cN1, cM0) Right Breast UOQ, Invasive Carcinoma with DCIS, ER+ / PR+ / Her2-, Grade 2  No evidence of recurrence on recent imaging and clinical exam.  She reports that her right breast swelling began just a few weeks ago.  Hopefully with physical therapy this will be improve.  Plan: As needed follow-up in radiation oncology.  She will continue close follow-up with Dr. Lindi Adie and continue on physical therapy.   15 minutes  of total time was spent for this patient encounter, including preparation, face-to-face counseling with the patient and coordination of care, physical exam, and documentation of the encounter. ____________________________________  Blair Promise, PhD, MD   This document serves as a record of services personally performed by Gery Pray, MD. It was created on his behalf by Roney Mans, a trained medical scribe. The creation of this record is based on the scribe's personal observations and the provider's statements to them. This document has been checked and approved by the attending provider.

## 2021-11-03 ENCOUNTER — Ambulatory Visit: Payer: Medicaid Other

## 2021-11-03 ENCOUNTER — Other Ambulatory Visit: Payer: Self-pay

## 2021-11-03 ENCOUNTER — Encounter: Payer: Self-pay | Admitting: Radiation Oncology

## 2021-11-03 ENCOUNTER — Ambulatory Visit
Admission: RE | Admit: 2021-11-03 | Discharge: 2021-11-03 | Disposition: A | Discharge status: Home / Self Care | Payer: Medicaid Other | Source: Ambulatory Visit | Attending: Radiation Oncology | Admitting: Radiation Oncology

## 2021-11-03 VITALS — BP 112/60 | HR 81 | Temp 97.1°F | Resp 18 | Ht 64.0 in | Wt 217.5 lb

## 2021-11-03 DIAGNOSIS — Z79899 Other long term (current) drug therapy: Secondary | ICD-10-CM | POA: Diagnosis not present

## 2021-11-03 DIAGNOSIS — Z791 Long term (current) use of non-steroidal anti-inflammatories (NSAID): Secondary | ICD-10-CM | POA: Diagnosis not present

## 2021-11-03 DIAGNOSIS — M25511 Pain in right shoulder: Secondary | ICD-10-CM

## 2021-11-03 DIAGNOSIS — Z483 Aftercare following surgery for neoplasm: Secondary | ICD-10-CM

## 2021-11-03 DIAGNOSIS — Z7984 Long term (current) use of oral hypoglycemic drugs: Secondary | ICD-10-CM | POA: Insufficient documentation

## 2021-11-03 DIAGNOSIS — C50411 Malignant neoplasm of upper-outer quadrant of right female breast: Secondary | ICD-10-CM | POA: Diagnosis not present

## 2021-11-03 DIAGNOSIS — R293 Abnormal posture: Secondary | ICD-10-CM

## 2021-11-03 DIAGNOSIS — Z923 Personal history of irradiation: Secondary | ICD-10-CM | POA: Diagnosis not present

## 2021-11-03 DIAGNOSIS — R600 Localized edema: Secondary | ICD-10-CM | POA: Insufficient documentation

## 2021-11-03 DIAGNOSIS — I890 Lymphedema, not elsewhere classified: Secondary | ICD-10-CM | POA: Diagnosis not present

## 2021-11-03 DIAGNOSIS — Z79811 Long term (current) use of aromatase inhibitors: Secondary | ICD-10-CM | POA: Diagnosis not present

## 2021-11-03 DIAGNOSIS — Z170 Estrogen receptor positive status [ER+]: Secondary | ICD-10-CM | POA: Diagnosis not present

## 2021-11-03 DIAGNOSIS — M25611 Stiffness of right shoulder, not elsewhere classified: Secondary | ICD-10-CM

## 2021-11-03 DIAGNOSIS — L599 Disorder of the skin and subcutaneous tissue related to radiation, unspecified: Secondary | ICD-10-CM

## 2021-11-03 NOTE — Therapy (Signed)
[facility] @ [facility], [place], [address] Phone: [phone]   Fax:  [phone]  Physical Therapy Treatment  Patient Details  Name: Melody Rodriguez MRN: [id number] Date of Birth: Feb 24, 1976 Referring Provider (PT): Wilber Bihari   Encounter Date: 11/03/2021   PT End of Session - 11/03/21 1105     Visit Number 5    Number of Visits 17    Date for PT Re-Evaluation 12/05/21    Authorization Type [employer] - needs auth after 20 visits    PT Start Time 1005    PT Stop Time 1102    PT Time Calculation (min) 57 min    Activity Tolerance Patient tolerated treatment well;Patient limited by pain    Behavior During Therapy [facility] for tasks assessed/performed             Past Medical History:  Diagnosis Date   Breast cancer ([place])    Diabetes mellitus without complication ([place])    Family history of colon cancer    History of radiation therapy    right breast/scv  05/10/21-07/01/21  Dr Gery Pray    Past Surgical History:  Procedure Laterality Date   BREAST BIOPSY Right 02/18/2021   Procedure: EVACUATION HEMATOMA RIGHT AXILLA;  Surgeon: [name], MD;  Location: [place];  Service: General;  Laterality: Right;   BREAST CYST EXCISION Right    Patient does not recall (2014 or 2015)   BREAST LUMPECTOMY WITH RADIOACTIVE SEED AND SENTINEL LYMPH NODE BIOPSY Right 02/07/2021   Procedure: RIGHT BREAST LUMPECTOMY WITH RADIOACTIVE SEED AND SEED TARGETED LYMPH NODE BIOPSY AND SENTINEL LYMPH NODE BIOPSY;  Surgeon: [name], MD;  Location: [facility];  Service: General;  Laterality: Right;   CESAREAN SECTION     IR IMAGING GUIDED PORT INSERTION  09/15/2020   IR REMOVAL TUN CV CATH W/O FL  09/15/2020   NODE DISSECTION Right 03/28/2021   Procedure: RIGHT AXILLARY LYMPH NODE DISSECTION;  Surgeon: [name], MD;  Location: [facility];  Service:  General;  Laterality: Right;   PORT-A-CATH REMOVAL Left 02/07/2021   Procedure: REMOVAL PORT-A-CATH;  Surgeon: [name], MD;  Location: [facility];  Service: General;  Laterality: Left;   THYROIDECTOMY, PARTIAL      There were no vitals filed for this visit.   Subjective Assessment - 11/03/21 1010     Subjective I feel a pull down into my Rt arm today.    Pertinent History Patient was diagnosed on 07/13/2020 with right grade 2 invasive ductal carcinoma breast cancer. It is ER/PR positive and HER2 negative with a Ki67 of 20%. Patient underwent neoadjuvant chemotherapy from 09/01/2020 - 01/14/2021. She had a right lumpectomy and sentinel node biopsy (1/1 nodes positive) on 02/07/2021. Hematoma evacuation on 02/18/2021. Axillary dissection on 03/28/2021 with 3/7 positive nodes. Iron deficiency. Radiation  05/10/2021-06/2021  Diagnosis of Type 2 diabetes in Nov. 2022.  Pt has had 2 steroid shots in right shoulder with only about 2 hours of relief.  She was advised not to have any more steroid shots due to her Diabetes    Patient Stated Goals to get help with decreasing the pain in her arm and swelling    Currently in Pain? Yes    Pain Score 7     Pain Location Arm    Pain Orientation Right    Pain Descriptors / Indicators --   pulling   Pain Type Chronic  pain    Pain Onset More than a month ago    Pain Frequency Constant    Aggravating Factors  overusing arm    Pain Relieving Factors not sure what caused the pull down into my arm today                               [facility] Adult PT Treatment/Exercise - 11/03/21 0001       Shoulder Exercises: Pulleys   Flexion 2 minutes    Flexion Limitations Pt did well with this today    Scaption 2 minutes    Scaption Limitations VCs to relax Rt shoulder      Shoulder Exercises: Therapy Ball   Flexion Both;10 reps    Flexion Limitations Pt returned therapist demo    ABduction Right;15 reps    ABduction  Limitations seated on mat with arm resting on ball and leaning to Rt side into tolerable stretch      Manual Therapy   Manual Lymphatic Drainage (MLD) In Supine: Short neck, superficial and deep abdominals, Lt axillary nodes, anterior inter-axillary anastomosis, Rt inguinal nodes and Rt axillo-inguinal anastomosis, then focused on Rt breast and UE, working from proximal to distal then redirecting towards anastomosis    Passive ROM In Supine with Rt UE elevated on pillow: Rt shoulder into flexion and scaption to pts tolerance slowly and gently with scapular depression throughout                       PT Short Term Goals - 10/06/21 1347       PT SHORT TERM GOAL #1   Title Pt will be able to have right shoulder flexion to > 125 degrees without pain so that she can perform her duties at home easider    Baseline 110 with pain on 10/06/2021    Time 4    Period Weeks    Status New      PT SHORT TERM GOAL #2   Title Pt will be independent in a beginning home exercise program for strength    Time 4    Period Weeks    Status New               PT Long Term Goals - 10/06/21 1349       PT LONG TERM GOAL #1   Title Pt will decrease quick DASH score to < 50 indicating and improvement in right UE function    Baseline 68.18 on 10/06/2021    Time 8    Period Weeks    Status New      PT LONG TERM GOAL #2   Title Pt will increase number of repetitions of sit to stand in 30 seconds to > 11 indicating an improvment in functional mobiliyt and strength    Baseline 8 on 10/06/2021    Time 8    Period Weeks    Status New      PT LONG TERM GOAL #3   Title Pt will increase grip strength of right hand to > 10 pounds so that she can perform daily tasks easier    Baseline < 5 pounds on 10/06/2021      PT LONG TERM GOAL #4   Title Pt will be independent in a progressive resistive exercise program that she can do  in the gym at her apartment complex    Time 8    Period  Weeks    Status  New                   Plan - 11/03/21 1105     Clinical Impression Statement Pt reports having seen Dr. Sondra Come this morning and he reports the lymphedema in her Rt breast has worsened so resumed manual lymph drainage here. Also continued gentle P/ROM to Rt shoulder as she was able to tolerate but pt is still very limited with this by pain. Also was able to palpate cording from her axilla into her upper arm so performed MFR here as well during session. Resumed ball roll up wall for AA/ROM stretching, then had pt sit for abduction with arm resting on ball and she reported feeling very good stretch with this without increased pain.    Personal Factors and Comorbidities Comorbidity 3+    Comorbidities muliple surgeries, chemotherapy, new diagnosed DM2    Examination-Activity Limitations Reach Overhead;Lift;Carry;Caring for Others    Examination-Participation Restrictions Occupation;Cleaning;Laundry;Meal Prep    Stability/Clinical Decision Making Stable/Uncomplicated    Rehab Potential Good    PT Frequency 2x / week    PT Duration 8 weeks    PT Treatment/Interventions Therapeutic exercise;Patient/family education;Manual techniques;Manual lymph drainage;Scar mobilization;Passive range of motion;ADLs/Self Care Home Management;Orthotic Fit/Training;Vasopneumatic Device;Taping    PT Next Visit Plan Review isometric Rt shoulder strengthening; Cont manual work to decrease pain and swelling in right upper quadrant and neck,cont MLD to Rt breast, progress exercise program prn, consider dry needling in neck pain does not respond to treatment    PT Home Exercise Plan Post op shoulder ROM HEP, supine dowel exercises; isometrics for Rt shoulder    Consulted and Agree with Plan of Care Patient             Patient will benefit from skilled therapeutic intervention in order to improve the following deficits and impairments:  Postural dysfunction, Decreased range of motion, Pain, Impaired UE functional  use, Decreased knowledge of precautions, Increased fascial restricitons, Decreased scar mobility, Increased edema, Decreased strength, Increased muscle spasms, Obesity, Impaired perceived functional ability, Decreased activity tolerance, Impaired flexibility  Visit Diagnosis: Lymphedema, not elsewhere classified  Disorder of the skin and subcutaneous tissue related to radiation, unspecified  Aftercare following surgery for neoplasm  Stiffness of right shoulder, not elsewhere classified  Acute pain of right shoulder  Abnormal posture     Problem List Patient Active Problem List   Diagnosis Date Noted   Pap smear for cervical cancer screening 08/19/2021   Bartholin cyst 05/06/2021   Amenorrhea, secondary 03/02/2021   PICC (peripherally inserted central catheter) in place 09/09/2020   Genetic testing 08/11/2020   Iron deficiency anemia due to chronic blood loss 08/04/2020   Family history of colon cancer    Malignant neoplasm of upper-outer quadrant of right breast in female, estrogen receptor positive ([place]) 07/28/2020    Otelia Limes, PTA 11/03/2021, 11:38 AM  [facility] @ [place], [place], [address] Phone: [phone]   Fax:  [phone]  Name: KILYN MARAGH MRN: [id number] Date of Birth: 08/23/1976

## 2021-11-03 NOTE — Progress Notes (Signed)
Melody Rodriguez is here today for follow up post radiation to the breast.   Breast Side:right   They completed their radiation on: 07/01/2021   Does the patient complain of any of the following: Post radiation skin issues: denies Breast Tenderness: yes Breast Swelling: yes Lymphadema: lymphedema in right arm, breast and axilla Range of Motion limitations: mild limitation Fatigue post radiation: mild fatigue Appetite good/fair/poor: good  Additional comments if applicable: itching rash on face, arms, hands and legs.  Vitals:   11/03/21 0909  BP: 112/60  Pulse: 81  Resp: 18  Temp: (!) 97.1 F (36.2 C)  TempSrc: Temporal  SpO2: 100%  Weight: 217 lb 8 oz (98.7 kg)  Height: 5\' 4"  (1.626 m)

## 2021-11-07 ENCOUNTER — Other Ambulatory Visit: Payer: Self-pay

## 2021-11-07 ENCOUNTER — Ambulatory Visit: Payer: Medicaid Other

## 2021-11-07 DIAGNOSIS — Z483 Aftercare following surgery for neoplasm: Secondary | ICD-10-CM

## 2021-11-07 DIAGNOSIS — I890 Lymphedema, not elsewhere classified: Secondary | ICD-10-CM | POA: Diagnosis not present

## 2021-11-07 DIAGNOSIS — M25611 Stiffness of right shoulder, not elsewhere classified: Secondary | ICD-10-CM

## 2021-11-07 DIAGNOSIS — M25511 Pain in right shoulder: Secondary | ICD-10-CM

## 2021-11-07 DIAGNOSIS — L599 Disorder of the skin and subcutaneous tissue related to radiation, unspecified: Secondary | ICD-10-CM

## 2021-11-07 DIAGNOSIS — R293 Abnormal posture: Secondary | ICD-10-CM

## 2021-11-07 NOTE — Therapy (Signed)
[place] °[facility] °[address] °[place], [place], [address] °Phone: [phone]   Fax:  [phone] ° °Physical Therapy Treatment ° °Patient Details  °Name: Melody Rodriguez °MRN: [id number] °Date of Birth: 12/21/1975 °Referring Provider (PT): Lindsey Causey ° ° °Encounter Date: 11/07/2021 ° ° PT End of Session - 11/07/21 0925   ° ° Visit Number 6   ° Number of Visits 17   ° Date for PT Re-Evaluation 12/05/21   ° Authorization Type [employer] - needs auth after 20 visits   ° PT Start Time 0909   ° PT Stop Time 1002   ° PT Time Calculation (min) 53 min   ° Activity Tolerance Patient tolerated treatment well;Patient limited by pain   ° Behavior During Therapy [facility] for tasks assessed/performed   ° °  °  ° °  ° ° °Past Medical History:  °Diagnosis Date  ° Breast cancer ([facility])   ° Diabetes mellitus without complication ([facility])   ° Family history of colon cancer   ° History of radiation therapy   ° right breast/scv  05/10/21-07/01/21  Dr James Kinard  ° ° °Past Surgical History:  °Procedure Laterality Date  ° BREAST BIOPSY Right 02/18/2021  ° Procedure: EVACUATION HEMATOMA RIGHT AXILLA;  Surgeon: Blackman, Douglas, MD;  Location: [facility];  Service: General;  Laterality: Right;  ° BREAST CYST EXCISION Right   ° Patient does not recall (2014 or 2015)  ° BREAST LUMPECTOMY WITH RADIOACTIVE SEED AND SENTINEL LYMPH NODE BIOPSY Right 02/07/2021  ° Procedure: RIGHT BREAST LUMPECTOMY WITH RADIOACTIVE SEED AND SEED TARGETED LYMPH NODE BIOPSY AND SENTINEL LYMPH NODE BIOPSY;  Surgeon: Blackman, Douglas, MD;  Location: [facility];  Service: General;  Laterality: Right;  ° CESAREAN SECTION    ° IR IMAGING GUIDED PORT INSERTION  09/15/2020  ° IR REMOVAL TUN CV CATH W/O FL  09/15/2020  ° NODE DISSECTION Right 03/28/2021  ° Procedure: RIGHT AXILLARY LYMPH NODE DISSECTION;  Surgeon: Blackman, Douglas, MD;  Location: [facility];  Service:  General;  Laterality: Right;  ° PORT-A-CATH REMOVAL Left 02/07/2021  ° Procedure: REMOVAL PORT-A-CATH;  Surgeon: Blackman, Douglas, MD;  Location: [facility];  Service: General;  Laterality: Left;  ° THYROIDECTOMY, PARTIAL    ° ° °There were no vitals filed for this visit. ° ° Subjective Assessment - 11/07/21 0911   ° ° Subjective My Rt shoulder is hurting today.   ° Pertinent History Patient was diagnosed on 07/13/2020 with right grade 2 invasive ductal carcinoma breast cancer. It is ER/PR positive and HER2 negative with a Ki67 of 20%. Patient underwent neoadjuvant chemotherapy from 09/01/2020 - 01/14/2021. She had a right lumpectomy and sentinel node biopsy (1/1 nodes positive) on 02/07/2021. Hematoma evacuation on 02/18/2021. Axillary dissection on 03/28/2021 with 3/7 positive nodes. Iron deficiency. Radiation  05/10/2021-06/2021  Diagnosis of Type 2 diabetes in Nov. 2022.  Pt has had 2 steroid shots in right shoulder with only about 2 hours of relief.  She was advised not to have any more steroid shots due to her Diabetes   ° Patient Stated Goals to get help with decreasing the pain in her arm and swelling   ° Currently in Pain? Yes   ° Pain Score 7    ° Pain Location Shoulder   ° Pain Orientation Right   ° Pain Descriptors / Indicators Throbbing   pulling, feels asleep  ° Pain Type Chronic pain   °   Pain Onset More than a month ago    Pain Frequency Constant    Aggravating Factors  overusing arm    Pain Relieving Factors it just always hurts                [facility] PT Assessment - 11/07/21 0001       AROM   Right Shoulder Flexion 106 Degrees    Right Shoulder ABduction 103 Degrees                           [facility] Adult PT Treatment/Exercise - 11/07/21 0001       Shoulder Exercises: Seated   Flexion AAROM;Right;10 reps    Flexion Limitations with pt returning therapist demo and in front of mirror for visual feedback    Abduction AAROM;Right;10 reps    ABduction  Limitations Pt returned therapist demo; VCs for flex and abd for correct technique and to work on decreasing Rt scapular compensation which is very difficult for pt due to muscle guarding/tightness      Shoulder Exercises: Pulleys   Flexion 2 minutes    Flexion Limitations Pt with increased pain today so had trouble relaxing with both pulley exs    Scaption 2 minutes    Scaption Limitations Tactile and VCs to relax Rt shoulder      Shoulder Exercises: Therapy Ball   Flexion Both;10 reps   forward lean into end of stretch   ABduction Right;15 reps    ABduction Limitations seated on mat with arm resting on ball and leaning to Rt side into tolerable stretch      Shoulder Exercises: Isometric Strengthening   Flexion 5X5"   Rt UE in doorway   Flexion Limitations Pt returned therapist demo for each    Extension 5X5"    External Rotation 5X5"    ABduction 5X5"    ABduction Limitations Pt reports increased "stabbing" pain with this one      Manual Therapy   Soft tissue mobilization Pt seated edge of bed: with cocoa butter to Rt upper trap and then in supine to Rt upper trap and ant/post shoulder during P/ROM    Passive ROM In Supine to Rt shoulder into flexion and scaption to pts tolerance slowly and gently with scapular depression throughout and VCs to relax throughout due to muscle guarding                       PT Short Term Goals - 10/06/21 1347       PT SHORT TERM GOAL #1   Title Pt will be able to have right shoulder flexion to > 125 degrees without pain so that she can perform her duties at home easider    Baseline 110 with pain on 10/06/2021    Time 4    Period Weeks    Status New      PT SHORT TERM GOAL #2   Title Pt will be independent in a beginning home exercise program for strength    Time 4    Period Weeks    Status New               PT Long Term Goals - 10/06/21 1349       PT LONG TERM GOAL #1   Title Pt will decrease quick DASH score to < 50  indicating and improvement in right UE function    Baseline 68.18 on 10/06/2021  Time 8   ° Period Weeks   ° Status New   °  ° PT LONG TERM GOAL #2  ° Title Pt will increase number of repetitions of sit to stand in 30 seconds to > 11 indicating an improvment in functional mobiliyt and strength   ° Baseline 8 on 10/06/2021   ° Time 8   ° Period Weeks   ° Status New   °  ° PT LONG TERM GOAL #3  ° Title Pt will increase grip strength of right hand to > 10 pounds so that she can perform daily tasks easier   ° Baseline < 5 pounds on 10/06/2021   °  ° PT LONG TERM GOAL #4  ° Title Pt will be independent in a progressive resistive exercise program that she can do  in the gym at her apartment complex   ° Time 8   ° Period Weeks   ° Status New   ° °  °  ° °  ° ° ° ° ° ° ° ° Plan - 11/07/21 0925   ° ° Clinical Impression Statement Progressed pt to include isometric strength for Rt shoulder. She tolerated all well with only mild increase pain except for abduction caused a stabbing pain. Pts AA/ROM was more limited today than at last session by pain and tightness with pulleys. Also added seated dowel exercises to give pt another way to stretch at home. She required multiple VCs to relax due to muscle guarding and tightness. She does seem to cont to tolerate ball exs well without increase pain. Then continued manual therapy to Rt upper quadrant which included STM to Rt upper trap in sitting and supine, and gentle P/ROM to Rt shoulder but this conts to be very limited due to pain and muscle guarding. However, pt did have some imrpovement with muscle relaxation towards end of session that allowed for slightly increased end P/ROM. She repotr her shoulder alos felt a little looser by end of session.   ° Personal Factors and Comorbidities Comorbidity 3+   ° Comorbidities muliple surgeries, chemotherapy, new diagnosed DM2   ° Examination-Activity Limitations Reach Overhead;Lift;Carry;Caring for Others   ° Examination-Participation  Restrictions Occupation;Cleaning;Laundry;Meal Prep   ° Stability/Clinical Decision Making Stable/Uncomplicated   ° Rehab Potential Good   ° PT Frequency 2x / week   ° PT Duration 8 weeks   ° PT Treatment/Interventions Therapeutic exercise;Patient/family education;Manual techniques;Manual lymph drainage;Scar mobilization;Passive range of motion;ADLs/Self Care Home Management;Orthotic Fit/Training;Vasopneumatic Device;Taping   ° PT Next Visit Plan Review isometric Rt shoulder strengthening and dowel exs; Cont manual work to decrease pain and swelling in right upper quadrant and neck,cont MLD to Rt breast, progress exercise program prn, consider dry needling in neck pain??   ° PT Home Exercise Plan Post op shoulder ROM HEP, supine dowel exercises; isometrics for Rt shoulder   ° Consulted and Agree with Plan of Care Patient   ° °  °  ° °  ° ° °Patient will benefit from skilled therapeutic intervention in order to improve the following deficits and impairments:  Postural dysfunction, Decreased range of motion, Pain, Impaired UE functional use, Decreased knowledge of precautions, Increased fascial restricitons, Decreased scar mobility, Increased edema, Decreased strength, Increased muscle spasms, Obesity, Impaired perceived functional ability, Decreased activity tolerance, Impaired flexibility ° °Visit Diagnosis: °Lymphedema, not elsewhere classified ° °Disorder of the skin and subcutaneous tissue related to radiation, unspecified ° °Aftercare following surgery for neoplasm ° °Stiffness of right shoulder, not elsewhere classified ° °  Acute pain of right shoulder  Abnormal posture     Problem List Patient Active Problem List   Diagnosis Date Noted   Pap smear for cervical cancer screening 08/19/2021   Bartholin cyst 05/06/2021   Amenorrhea, secondary 03/02/2021   PICC (peripherally inserted central catheter) in place 09/09/2020   Genetic testing 08/11/2020   Iron deficiency anemia due to chronic blood loss  08/04/2020   Family history of colon cancer    Malignant neoplasm of upper-outer quadrant of right breast in female, estrogen receptor positive ([place]) 07/28/2020    Otelia Limes, PTA 11/07/2021, 12:20 PM  [facility] @ [place], [place], [address] Phone: [phone]   Fax:  [phone]  Name: Melody Rodriguez MRN: [id number] Date of Birth: [birth year]

## 2021-11-08 ENCOUNTER — Other Ambulatory Visit (HOSPITAL_COMMUNITY): Payer: Self-pay

## 2021-11-08 MED ORDER — HYDROXYZINE HCL 10 MG PO TABS
ORAL_TABLET | ORAL | 0 refills | Status: DC
  Filled 2021-11-08: qty 90, 30d supply, fill #0

## 2021-11-11 IMAGING — US US NEEDLE LOCALIZATION*R*
1 series · 4 of 4 positions shown · non-contrast
Comparison: Previous exam(s).

CLINICAL DATA: Patient presents for radioactive seed localization
of biopsy-proven malignancy over the upper-outer right breast marked
by ribbon clip as well as localization of benign biopsy site over
the upper outer right breast marked by barbell shaped clip. These 2
areas will be bracketed. Ultrasound-guided radioactive seed
localization will be performed of the biopsy-proven metastatic right
axillary lymph node containing heart shaped clip.

EXAM:
MAMMOGRAPHIC GUIDED RADIOACTIVE SEED LOCALIZATION OF THE RIGHT
BREAST AS WELL AS RADIOACTIVE SEED LOCALIZATION VIA ULTRASOUND
GUIDANCE BIOPSY-PROVEN METASTATIC RIGHT AXILLARY LYMPH NODE.

[Series 1: us needle localization*right* · 0.07mm/px · 4 of 4 slices shown]
[im 1/4]
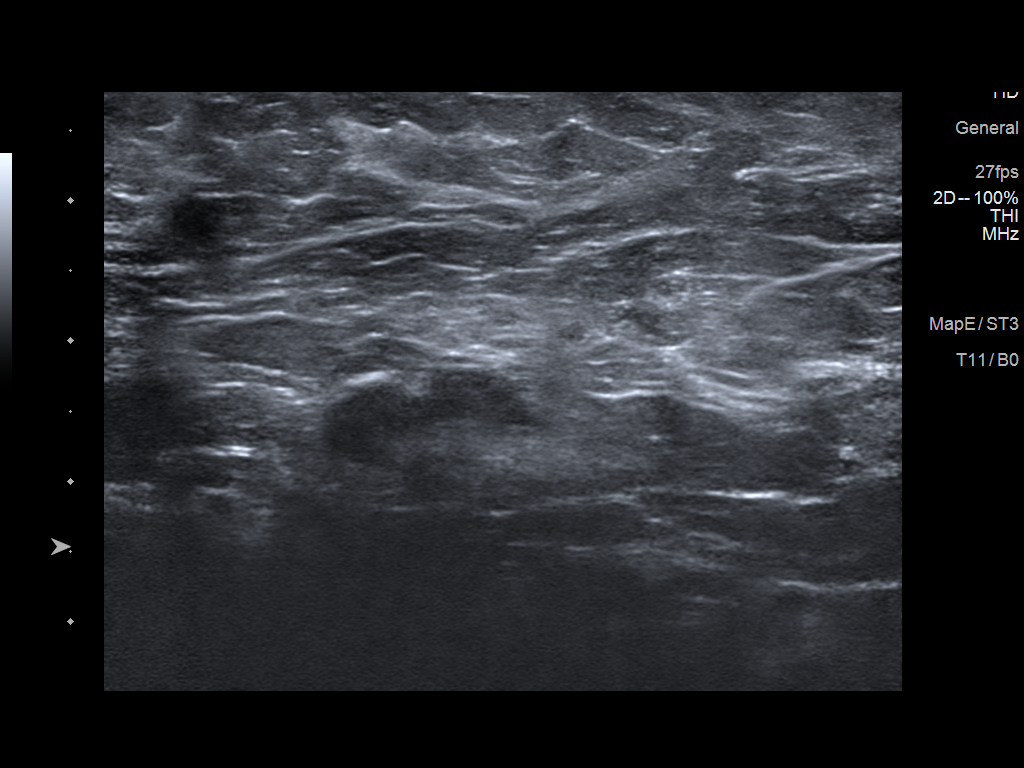
[im 2/4]
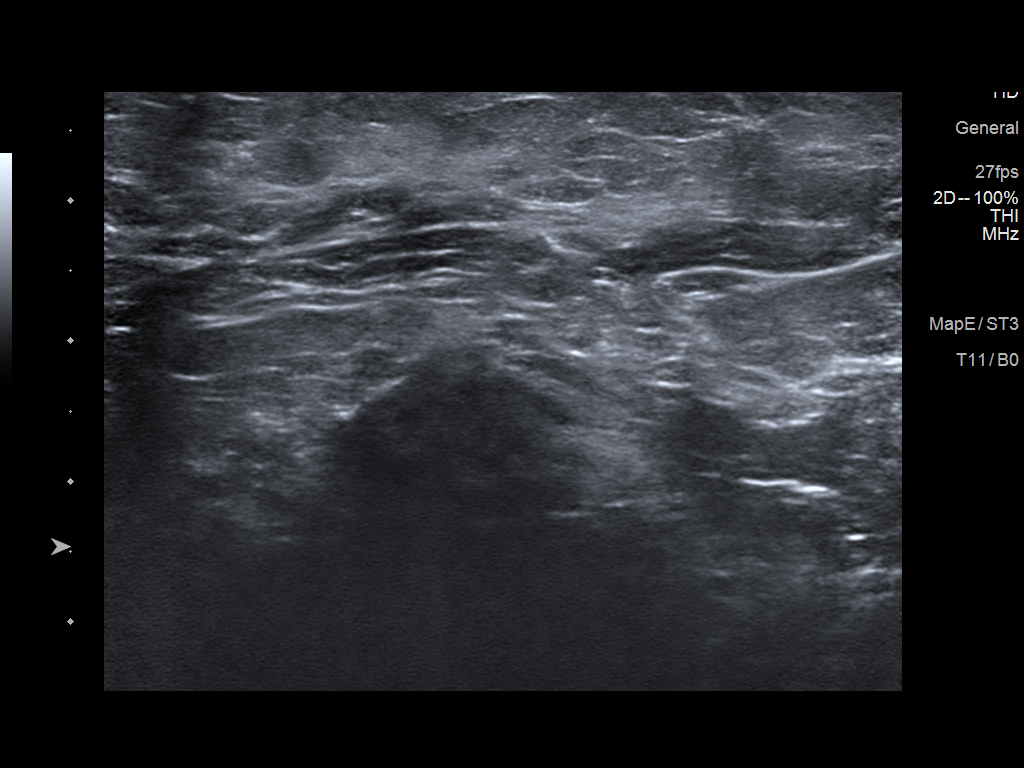
[im 3/4]
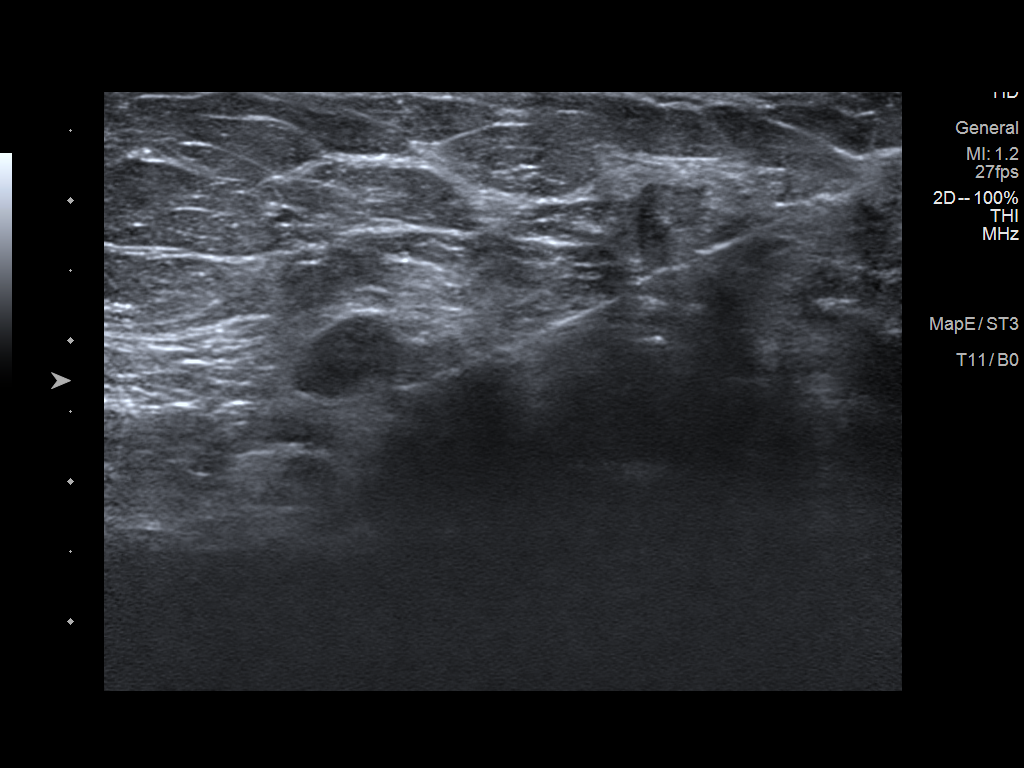
[im 4/4]
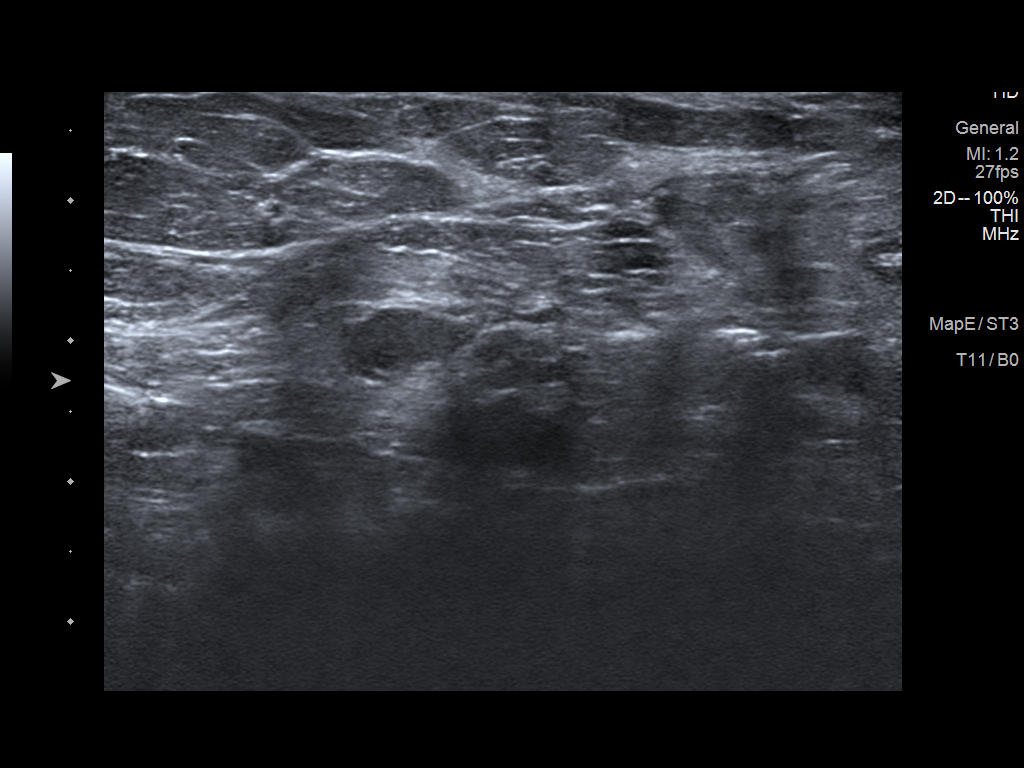

[4 of 4 positions shown; findings below may reference images not displayed]

FINDINGS: Patient presents for radioactive seed localization prior to surgical
excision. I met with the patient and we discussed the procedure of
seed localization including benefits and alternatives. We discussed
the high likelihood of a successful procedure. We discussed the
risks of the procedure including infection, bleeding, tissue injury
and further surgery. We discussed the low dose of radioactivity
involved in the procedure. Informed, written consent was given.

The usual time-out protocol was performed immediately prior to the
procedure.

Mammographic guided bracketing of the ribbon clip at the
biopsy-proven cancer site and barbell shaped clip at the benign site
just posterior to the malignancy will be performed.

1. Using mammographic guidance, sterile technique, 1% lidocaine and
an X-EKP radioactive seed, the ribbon clip was localized using a
lateral to medial approach. The follow-up mammogram images confirm
the seed in the expected location and were marked for Dr. Lasmer.

Follow-up survey of the patient confirms presence of the radioactive
seed.

Order number of X-EKP seed:  [id number].

Total activity:  0.252 millicurie reference Date: 01/20/2021

[DATE]. Using mammographic guidance, sterile technique, 1% lidocaine and
an X-EKP radioactive seed, the barbell shaped clip was localized
using a lateral to medial approach. The follow-up mammogram images
confirm the seed in the expected location and were marked for Dr.
Lasmer.

Follow-up survey of the patient confirms presence of the radioactive
seed.

Order number of X-EKP seed:  [id number].

Total activity:  0.252 millicurie reference Date: 01/20/2021

[DATE]. Using ultrasound guidance, sterile technique, 1% lidocaine and an
X-EKP radioactive seed, the targeted biopsy-proven metastatic right
axillary lymph node containing heart shaped clip was localized using
a lateral to medial approach. The follow-up mammogram images do not
visualize the targeted right axillary lymph node as it is too deep
to visualized mammographically.

Follow-up survey of the patient confirms presence of the radioactive
seed.

Order number of X-EKP seed:  [id number].

Total activity:  0.254 millicurie reference Date: 01/19/2021

The patient tolerated the procedure well and was released from the
[REDACTED]. She was given instructions regarding seed removal.
IMPRESSION: Radioactive seed localization right breast bracketing the ribbon
shaped and barbell shaped clips. Recommend generous lateral margin
of the excision due to subtle microcalcifications along the
posterolateral margin. Radioactive seed localization of
biopsy-proven metastatic right axillary lymph node. No apparent
complications.

## 2021-11-14 ENCOUNTER — Ambulatory Visit: Payer: Medicaid Other

## 2021-11-15 ENCOUNTER — Ambulatory Visit: Payer: Medicaid Other

## 2021-11-15 ENCOUNTER — Other Ambulatory Visit: Payer: Self-pay

## 2021-11-15 DIAGNOSIS — I890 Lymphedema, not elsewhere classified: Secondary | ICD-10-CM

## 2021-11-15 DIAGNOSIS — M25611 Stiffness of right shoulder, not elsewhere classified: Secondary | ICD-10-CM

## 2021-11-15 DIAGNOSIS — Z483 Aftercare following surgery for neoplasm: Secondary | ICD-10-CM

## 2021-11-15 DIAGNOSIS — M25511 Pain in right shoulder: Secondary | ICD-10-CM

## 2021-11-15 DIAGNOSIS — R293 Abnormal posture: Secondary | ICD-10-CM

## 2021-11-15 DIAGNOSIS — L599 Disorder of the skin and subcutaneous tissue related to radiation, unspecified: Secondary | ICD-10-CM

## 2021-11-15 NOTE — Therapy (Signed)
[facility] @ [address] [place], [place], [address] Phone: [phone]   Fax:  [phone]  Physical Therapy Treatment  Patient Details  Name: Melody Rodriguez MRN: [id number] Date of Birth: January 04, 1976 Referring Provider (PT): Wilber Bihari   Encounter Date: 11/15/2021   PT End of Session - 11/15/21 1000     Visit Number 7    Number of Visits 17    Date for PT Re-Evaluation 12/05/21    Authorization Type [employer] - needs auth after 20 visits    PT Start Time 0905    PT Stop Time 1002    PT Time Calculation (min) 57 min    Activity Tolerance Patient tolerated treatment well    Behavior During Therapy [facility] for tasks assessed/performed             Past Medical History:  Diagnosis Date   Breast cancer ([place])    Diabetes mellitus without complication ([place])    Family history of colon cancer    History of radiation therapy    right breast/scv  05/10/21-07/01/21  Dr Gery Pray    Past Surgical History:  Procedure Laterality Date   BREAST BIOPSY Right 02/18/2021   Procedure: EVACUATION HEMATOMA RIGHT AXILLA;  Surgeon: [name], MD;  Location: [facility];  Service: General;  Laterality: Right;   BREAST CYST EXCISION Right    Patient does not recall (2014 or 2015)   BREAST LUMPECTOMY WITH RADIOACTIVE SEED AND SENTINEL LYMPH NODE BIOPSY Right 02/07/2021   Procedure: RIGHT BREAST LUMPECTOMY WITH RADIOACTIVE SEED AND SEED TARGETED LYMPH NODE BIOPSY AND SENTINEL LYMPH NODE BIOPSY;  Surgeon: [name], MD;  Location: [place];  Service: General;  Laterality: Right;   CESAREAN SECTION     IR IMAGING GUIDED PORT INSERTION  09/15/2020   IR REMOVAL TUN CV CATH W/O FL  09/15/2020   NODE DISSECTION Right 03/28/2021   Procedure: RIGHT AXILLARY LYMPH NODE DISSECTION;  Surgeon: [name], MD;  Location: [place];  Service: General;  Laterality: Right;    PORT-A-CATH REMOVAL Left 02/07/2021   Procedure: REMOVAL PORT-A-CATH;  Surgeon: [name], MD;  Location: [place];  Service: General;  Laterality: Left;   THYROIDECTOMY, PARTIAL      There were no vitals filed for this visit.   Subjective Assessment - 11/15/21 0912     Subjective I don't know what you did last week but I can definitely move my arm more. The pain is worse, but my movement is better. I can wash my hair better.    Pertinent History Patient was diagnosed on 07/13/2020 with right grade 2 invasive ductal carcinoma breast cancer. It is ER/PR positive and HER2 negative with a Ki67 of 20%. Patient underwent neoadjuvant chemotherapy from 09/01/2020 - 01/14/2021. She had a right lumpectomy and sentinel node biopsy (1/1 nodes positive) on 02/07/2021. Hematoma evacuation on 02/18/2021. Axillary dissection on 03/28/2021 with 3/7 positive nodes. Iron deficiency. Radiation  05/10/2021-06/2021  Diagnosis of Type 2 diabetes in Nov. 2022.  Pt has had 2 steroid shots in right shoulder with only about 2 hours of relief.  She was advised not to have any more steroid shots due to her Diabetes    Patient Stated Goals to get help with decreasing the pain in her arm and swelling    Currently in Pain? Yes    Pain Score 7     Pain Location Shoulder    Pain  Orientation Right    Pain Descriptors / Indicators Other (Comment);Heaviness   feel like it's breaking   Pain Type Chronic pain    Pain Onset More than a month ago    Pain Frequency Constant    Aggravating Factors  overusing arm    Pain Relieving Factors always hurts                               [facility] Adult PT Treatment/Exercise - 11/15/21 0001       Shoulder Exercises: Pulleys   Flexion 2 minutes    Flexion Limitations VCs initially to decrease Rt scapular compensation but then pt able to do independently    Scaption 1 minute      Manual Therapy   Manual Therapy Soft tissue mobilization;Myofascial  release;Passive ROM    Soft tissue mobilization In supine with cocoa butter to Rt upper trap and ant/post shoulder during P/ROM    Myofascial Release Was able to include MFR to Rt axilla as today was first time ROM allowed for access to axilla. Pt reports feeling good stretch with MFR to axilla    Passive ROM In Supine to Rt shoulder into flexion and scaption to pts tolerance slowly and gently with scapular depression throughout and VCs to relax throughout due to muscle guarding                       PT Short Term Goals - 10/06/21 1347       PT SHORT TERM GOAL #1   Title Pt will be able to have right shoulder flexion to > 125 degrees without pain so that she can perform her duties at home easider    Baseline 110 with pain on 10/06/2021    Time 4    Period Weeks    Status New      PT SHORT TERM GOAL #2   Title Pt will be independent in a beginning home exercise program for strength    Time 4    Period Weeks    Status New               PT Long Term Goals - 10/06/21 1349       PT LONG TERM GOAL #1   Title Pt will decrease quick DASH score to < 50 indicating and improvement in right UE function    Baseline 68.18 on 10/06/2021    Time 8    Period Weeks    Status New      PT LONG TERM GOAL #2   Title Pt will increase number of repetitions of sit to stand in 30 seconds to > 11 indicating an improvment in functional mobiliyt and strength    Baseline 8 on 10/06/2021    Time 8    Period Weeks    Status New      PT LONG TERM GOAL #3   Title Pt will increase grip strength of right hand to > 10 pounds so that she can perform daily tasks easier    Baseline < 5 pounds on 10/06/2021      PT LONG TERM GOAL #4   Title Pt will be independent in a progressive resistive exercise program that she can do  in the gym at her apartment complex    Time 8    Period Weeks    Status New  Plan - 11/15/21 1001     Clinical Impression Statement Good  progress noted from last week with her end Rt shoulder P/ROM and pt reports though still painful, her shoulder feels looser. Was able to cont with slightly more aggressive passive stretching though she did report her cording into forearm was pulling more today. Her end P/ROM conts to show improvement by end of session.    Personal Factors and Comorbidities Comorbidity 3+    Comorbidities muliple surgeries, chemotherapy, new diagnosed DM2    Examination-Activity Limitations Reach Overhead;Lift;Carry;Caring for Others    Examination-Participation Restrictions Occupation;Cleaning;Laundry;Meal Prep    Stability/Clinical Decision Making Stable/Uncomplicated    Rehab Potential Good    PT Frequency 2x / week    PT Duration 8 weeks    PT Treatment/Interventions Therapeutic exercise;Patient/family education;Manual techniques;Manual lymph drainage;Scar mobilization;Passive range of motion;ADLs/Self Care Home Management;Orthotic Fit/Training;Vasopneumatic Device;Taping    PT Next Visit Plan Review isometric Rt shoulder strengthening and dowel exs; Cont manual work to decrease pain and swelling in right upper quadrant and neck,cont MLD to Rt breast, progress exercise program prn, consider dry needling in neck pain??    Consulted and Agree with Plan of Care Patient             Patient will benefit from skilled therapeutic intervention in order to improve the following deficits and impairments:  Postural dysfunction, Decreased range of motion, Pain, Impaired UE functional use, Decreased knowledge of precautions, Increased fascial restricitons, Decreased scar mobility, Increased edema, Decreased strength, Increased muscle spasms, Obesity, Impaired perceived functional ability, Decreased activity tolerance, Impaired flexibility  Visit Diagnosis: Lymphedema, not elsewhere classified  Disorder of the skin and subcutaneous tissue related to radiation, unspecified  Aftercare following surgery for  neoplasm  Stiffness of right shoulder, not elsewhere classified  Acute pain of right shoulder  Abnormal posture     Problem List Patient Active Problem List   Diagnosis Date Noted   Pap smear for cervical cancer screening 08/19/2021   Bartholin cyst 05/06/2021   Amenorrhea, secondary 03/02/2021   PICC (peripherally inserted central catheter) in place 09/09/2020   Genetic testing 08/11/2020   Iron deficiency anemia due to chronic blood loss 08/04/2020   Family history of colon cancer    Malignant neoplasm of upper-outer quadrant of right breast in female, estrogen receptor positive ([place]) 07/28/2020    Otelia Limes, PTA 11/15/2021, 10:05 AM  [facility] @ [address] [place], [place], [address] Phone: [phone]   Fax:  [phone]  Name: Melody Rodriguez MRN: [id number] Date of Birth: 05/12/1976

## 2021-11-18 ENCOUNTER — Ambulatory Visit: Payer: Medicaid Other | Attending: Adult Health | Admitting: Physical Therapy

## 2021-11-18 ENCOUNTER — Encounter: Payer: Self-pay | Admitting: Physical Therapy

## 2021-11-18 ENCOUNTER — Other Ambulatory Visit: Payer: Self-pay

## 2021-11-18 DIAGNOSIS — Z483 Aftercare following surgery for neoplasm: Secondary | ICD-10-CM | POA: Insufficient documentation

## 2021-11-18 DIAGNOSIS — M25511 Pain in right shoulder: Secondary | ICD-10-CM | POA: Diagnosis present

## 2021-11-18 DIAGNOSIS — Z170 Estrogen receptor positive status [ER+]: Secondary | ICD-10-CM | POA: Diagnosis present

## 2021-11-18 DIAGNOSIS — M25611 Stiffness of right shoulder, not elsewhere classified: Secondary | ICD-10-CM | POA: Diagnosis present

## 2021-11-18 DIAGNOSIS — C50411 Malignant neoplasm of upper-outer quadrant of right female breast: Secondary | ICD-10-CM | POA: Insufficient documentation

## 2021-11-18 DIAGNOSIS — I890 Lymphedema, not elsewhere classified: Secondary | ICD-10-CM | POA: Insufficient documentation

## 2021-11-18 DIAGNOSIS — L599 Disorder of the skin and subcutaneous tissue related to radiation, unspecified: Secondary | ICD-10-CM | POA: Diagnosis present

## 2021-11-18 DIAGNOSIS — R293 Abnormal posture: Secondary | ICD-10-CM | POA: Diagnosis present

## 2021-11-18 DIAGNOSIS — M79601 Pain in right arm: Secondary | ICD-10-CM | POA: Insufficient documentation

## 2021-11-18 NOTE — Therapy (Signed)
OUTPATIENT PHYSICAL THERAPY TREATMENT NOTE   Patient Name: Melody Rodriguez MRN: [id number] DOB:Aug 02, 1976, 46 y.o., female Today's Date: 11/18/2021  PCP: Patient, [name] REFERRING PROVIDER: Deanne Coffer*   PT End of Session - 11/18/21 1011     Visit Number 8    Number of Visits 17    Date for PT Re-Evaluation 12/05/21    Authorization - Visit Number 17    PT Start Time 0900    PT Stop Time 1000    PT Time Calculation (min) 60 min    Activity Tolerance Patient tolerated treatment well    Behavior During Therapy [facility] for tasks assessed/performed             Past Medical History:  Diagnosis Date   Breast cancer ([place])    Diabetes mellitus without complication ([place])    Family history of colon cancer    History of radiation therapy    right breast/scv  05/10/21-07/01/21  Dr Melody Rodriguez   Past Surgical History:  Procedure Laterality Date   BREAST BIOPSY Right 02/18/2021   Procedure: EVACUATION HEMATOMA RIGHT AXILLA;  Surgeon: Melody Keens, MD;  Location: [place];  Service: General;  Laterality: Right;   BREAST CYST EXCISION Right    Patient does not recall (2014 or 2015)   BREAST LUMPECTOMY WITH RADIOACTIVE SEED AND SENTINEL LYMPH NODE BIOPSY Right 02/07/2021   Procedure: RIGHT BREAST LUMPECTOMY WITH RADIOACTIVE SEED AND SEED TARGETED LYMPH NODE BIOPSY AND SENTINEL LYMPH NODE BIOPSY;  Surgeon: Melody Keens, MD;  Location: [place];  Service: General;  Laterality: Right;   CESAREAN SECTION     IR IMAGING GUIDED PORT INSERTION  09/15/2020   IR REMOVAL TUN CV CATH W/O FL  09/15/2020   NODE DISSECTION Right 03/28/2021   Procedure: RIGHT AXILLARY LYMPH NODE DISSECTION;  Surgeon: Melody Keens, MD;  Location: [place];  Service: General;  Laterality: Right;   PORT-A-CATH REMOVAL Left 02/07/2021   Procedure: REMOVAL PORT-A-CATH;  Surgeon: Melody Keens, MD;  Location: [place];   Service: General;  Laterality: Left;   THYROIDECTOMY, PARTIAL     Patient Active Problem List   Diagnosis Date Noted   Pap smear for cervical cancer screening 08/19/2021   Bartholin cyst 05/06/2021   Amenorrhea, secondary 03/02/2021   PICC (peripherally inserted central catheter) in place 09/09/2020   Genetic testing 08/11/2020   Iron deficiency anemia due to chronic blood loss 08/04/2020   Family history of colon cancer    Malignant neoplasm of upper-outer quadrant of right breast in female, estrogen receptor positive ([place]) 07/28/2020    REFERRING DIAG: right breast cancer   THERAPY DIAG:  Lymphedema, not elsewhere classified  Disorder of the skin and subcutaneous tissue related to radiation, unspecified  Aftercare following surgery for neoplasm  Stiffness of right shoulder, not elsewhere classified  Acute pain of right shoulder  Abnormal posture   Encounter Date: 11/18/2021   PT End of Session - 11/18/21 1011     Visit Number 8    Number of Visits 17    Date for PT Re-Evaluation 12/05/21    Authorization - Visit Number 17    PT Start Time 0900    PT Stop Time 1000    PT Time Calculation (min) 60 min    Activity Tolerance Patient tolerated treatment well    Behavior During Therapy [facility] for tasks assessed/performed  Past Medical History:  Diagnosis Date   Breast cancer ([place])    Diabetes mellitus without complication ([place])    Family history of colon cancer    History of radiation therapy    right breast/scv  05/10/21-07/01/21  Dr Melody Rodriguez    Past Surgical History:  Procedure Laterality Date   BREAST BIOPSY Right 02/18/2021   Procedure: EVACUATION HEMATOMA RIGHT AXILLA;  Surgeon: Melody Keens, MD;  Location: [place];  Service: General;  Laterality: Right;   BREAST CYST EXCISION Right    Patient does not recall (2014 or 2015)   BREAST LUMPECTOMY WITH RADIOACTIVE SEED AND SENTINEL LYMPH NODE BIOPSY Right 02/07/2021    Procedure: RIGHT BREAST LUMPECTOMY WITH RADIOACTIVE SEED AND SEED TARGETED LYMPH NODE BIOPSY AND SENTINEL LYMPH NODE BIOPSY;  Surgeon: Melody Keens, MD;  Location: [place];  Service: General;  Laterality: Right;   CESAREAN SECTION     IR IMAGING GUIDED PORT INSERTION  09/15/2020   IR REMOVAL TUN CV CATH W/O FL  09/15/2020   NODE DISSECTION Right 03/28/2021   Procedure: RIGHT AXILLARY LYMPH NODE DISSECTION;  Surgeon: Melody Keens, MD;  Location: [place];  Service: General;  Laterality: Right;   PORT-A-CATH REMOVAL Left 02/07/2021   Procedure: REMOVAL PORT-A-CATH;  Surgeon: Melody Keens, MD;  Location: [place];  Service: General;  Laterality: Left;   THYROIDECTOMY, PARTIAL      There were no vitals filed for this visit.   Subjective Assessment - 11/18/21 1119     Subjective Pt states that nothing really helps the pain in her arm. She comes in wearing her circular knit compression sleeve that is wrinkled at her forearm and creating indentations.  She says that she feels like her arm is swelling into the sleeve and that she cannot tolerate wearing her glove. She is using her Apple watch to monitor her calories, exercise minutes and stand times as well as meditation.    Pertinent History Patient was diagnosed on 07/13/2020 with right grade 2 invasive ductal carcinoma breast cancer. It is ER/PR positive and HER2 negative with a Ki67 of 20%. Patient underwent neoadjuvant chemotherapy from 09/01/2020 - 01/14/2021. She had a right lumpectomy and sentinel node biopsy (1/1 nodes positive) on 02/07/2021. Hematoma evacuation on 02/18/2021. Axillary dissection on 03/28/2021 with 3/7 positive nodes. Iron deficiency. Radiation  05/10/2021-06/2021  Diagnosis of Type 2 diabetes in Nov. 2022.  Pt has had 2 steroid shots in right shoulder with only about 2 hours of relief.  She was advised not to have any more steroid shots due to her Diabetes    Patient  Stated Goals to get help with decreasing the pain in her arm and swelling    Currently in Pain? Yes    Pain Score 5     Pain Location Shoulder    Pain Orientation Right    Pain Descriptors / Indicators Aching;Heaviness    Pain Type Chronic pain    Pain Radiating Towards breast    Pain Onset More than a month ago    Pain Frequency Constant    Aggravating Factors  using arm    Pain Relieving Factors always hurts    Effect of Pain on Daily Activities She finds that she holds her right arm at her side with elbow and wrist bent               Today's treatment: pt appeared to have increased congestion in her rigt arm after wearing  circular knit sleeve.  Discussed with Shan Levans, PT who recommended a flat knit sleeve and sent off demographics to [facility] to see if there is coverage for a flat knit sleeve and glove. Began with sitting ROM to neck and upper thoracic area with deep breathing followed by MLD: short neck, deep breathing right shoulder arm to hand and return along pathways. Right inguinal nodes, with right axillo-inguinal anastamosis as tolerated. PROM to right shoulder , elbow and forearm to counter the flexed, pronated and adducted position pt finds herself resting right arm in.Then, to left sidelying, for soft tissue work with cocoa butter to tight traps, posterior shoulder, latts and axilla that pt is having pain and tightness in. Pt had some palpable softening to tissue, but she reported she still had pain.          PT Short Term Goals - 10/06/21 1347       PT SHORT TERM GOAL #1   Title Pt will be able to have right shoulder flexion to > 125 degrees without pain so that she can perform her duties at home easider    Baseline 110 with pain on 10/06/2021    Time 4    Period Weeks    Status New      PT SHORT TERM GOAL #2   Title Pt will be independent in a beginning home exercise program for strength    Time 4    Period Weeks    Status New               PT  Long Term Goals - 10/06/21 1349       PT LONG TERM GOAL #1   Title Pt will decrease quick DASH score to < 50 indicating and improvement in right UE function    Baseline 68.18 on 10/06/2021    Time 8    Period Weeks    Status New      PT LONG TERM GOAL #2   Title Pt will increase number of repetitions of sit to stand in 30 seconds to > 11 indicating an improvment in functional mobiliyt and strength    Baseline 8 on 10/06/2021    Time 8    Period Weeks    Status New      PT LONG TERM GOAL #3   Title Pt will increase grip strength of right hand to > 10 pounds so that she can perform daily tasks easier    Baseline < 5 pounds on 10/06/2021      PT LONG TERM GOAL #4   Title Pt will be independent in a progressive resistive exercise program that she can do  in the gym at her apartment complex    Time 8    Period Weeks    Status New                Clinical Impression Statement Pt continues to have pain and congestion in her right upper quadrant despite manual therapy, use of compression and exercise.  She is not tolerating circular knit garments well as she feels she swells into them and would likely better tolerate flat knit garments. She is tending to keep right arm adducted at side with elbow bent, forearm pronated and wrist bent.  Pt encouraged to counteract this movement pattern and continue to increase her activity      Personal Factors and Comorbidities Comorbidity 3+     Comorbidities muliple surgeries, chemotherapy, new diagnosed DM2     Examination-Activity  Limitations Reach Overhead;Lift;Carry;Caring for Others     Examination-Participation Restrictions Occupation;Cleaning;Laundry;Meal Prep     Stability/Clinical Decision Making Stable/Uncomplicated     Rehab Potential Good     PT Frequency 2x / week     PT Duration 8 weeks     PT Treatment/Interventions Therapeutic exercise;Patient/family education;Manual techniques;Manual lymph drainage;Scar mobilization;Passive range  of motion;ADLs/Self Care Home Management;Orthotic Fit/Training;Vasopneumatic Device;Taping     PT Next Visit Plan Review isometric Rt shoulder strengthening and dowel exs; Cont manual work to decrease pain and swelling in right upper quadrant and neck,cont MLD to Rt breast, progress exercise program prn, consider dry needling in neck pain??     PT Home Exercise Plan Post op shoulder ROM HEP, supine dowel exercises; isometrics for Rt shoulder     Consulted and Agree with Plan of Care Patient            Patient will benefit from skilled therapeutic intervention in order to improve the following deficits and impairments:     Visit Diagnosis: Lymphedema, not elsewhere classified  Disorder of the skin and subcutaneous tissue related to radiation, unspecified  Aftercare following surgery for neoplasm  Stiffness of right shoulder, not elsewhere classified  Acute pain of right shoulder  Abnormal posture     Problem List Patient Active Problem List   Diagnosis Date Noted   Pap smear for cervical cancer screening 08/19/2021   Bartholin cyst 05/06/2021   Amenorrhea, secondary 03/02/2021   PICC (peripherally inserted central catheter) in place 09/09/2020   Genetic testing 08/11/2020   Iron deficiency anemia due to chronic blood loss 08/04/2020   Family history of colon cancer    Malignant neoplasm of upper-outer quadrant of right breast in female, estrogen receptor positive ([place]) 07/28/2020   Donato Heinz. Owens Shark PT  Norwood Levo, PT 11/18/2021, 11:24 AM  [facility] @ [address] [place], [place], [address] Phone: [phone]   Fax:  [phone]  Name: JERIAH SKUFCA MRN: [id number] Date of Birth: 1976/06/19

## 2021-11-21 ENCOUNTER — Other Ambulatory Visit: Payer: Self-pay

## 2021-11-21 ENCOUNTER — Inpatient Hospital Stay: Payer: Medicaid Other | Attending: Hematology and Oncology

## 2021-11-21 VITALS — BP 134/65 | HR 80 | Temp 98.6°F | Resp 18

## 2021-11-21 DIAGNOSIS — Z5111 Encounter for antineoplastic chemotherapy: Secondary | ICD-10-CM | POA: Insufficient documentation

## 2021-11-21 DIAGNOSIS — Z170 Estrogen receptor positive status [ER+]: Secondary | ICD-10-CM | POA: Diagnosis not present

## 2021-11-21 DIAGNOSIS — Z79811 Long term (current) use of aromatase inhibitors: Secondary | ICD-10-CM | POA: Insufficient documentation

## 2021-11-21 DIAGNOSIS — C773 Secondary and unspecified malignant neoplasm of axilla and upper limb lymph nodes: Secondary | ICD-10-CM | POA: Diagnosis present

## 2021-11-21 DIAGNOSIS — Z79899 Other long term (current) drug therapy: Secondary | ICD-10-CM | POA: Diagnosis not present

## 2021-11-21 DIAGNOSIS — C50411 Malignant neoplasm of upper-outer quadrant of right female breast: Secondary | ICD-10-CM | POA: Insufficient documentation

## 2021-11-21 MED ORDER — GOSERELIN ACETATE 3.6 MG ~~LOC~~ IMPL
3.6000 mg | DRUG_IMPLANT | Freq: Once | SUBCUTANEOUS | Status: AC
  Administered 2021-11-21: 3.6 mg via SUBCUTANEOUS
  Filled 2021-11-21: qty 3.6

## 2021-11-22 ENCOUNTER — Ambulatory Visit: Payer: Medicaid Other

## 2021-11-22 DIAGNOSIS — R293 Abnormal posture: Secondary | ICD-10-CM

## 2021-11-22 DIAGNOSIS — M25511 Pain in right shoulder: Secondary | ICD-10-CM

## 2021-11-22 DIAGNOSIS — M25611 Stiffness of right shoulder, not elsewhere classified: Secondary | ICD-10-CM

## 2021-11-22 DIAGNOSIS — I890 Lymphedema, not elsewhere classified: Secondary | ICD-10-CM

## 2021-11-22 DIAGNOSIS — L599 Disorder of the skin and subcutaneous tissue related to radiation, unspecified: Secondary | ICD-10-CM

## 2021-11-22 DIAGNOSIS — Z483 Aftercare following surgery for neoplasm: Secondary | ICD-10-CM

## 2021-11-22 NOTE — Therapy (Signed)
OUTPATIENT PHYSICAL THERAPY TREATMENT NOTE   Patient Name: Melody Rodriguez MRN: [id number] DOB:04-22-1976, 46 y.o., female Today's Date: 11/22/2021  PCP: Patient, [name] REFERRING PROVIDER: Deanne Coffer*   PT End of Session - 11/22/21 0915     Visit Number 9    Number of Visits 17    Date for PT Re-Evaluation 12/05/21    Authorization Type [employer] - needs auth after 20 visits    Authorization - Visit Number 9    Authorization - Number of Visits 20    PT Start Time 0907    PT Stop Time 1003    PT Time Calculation (min) 56 min    Activity Tolerance Patient tolerated treatment well    Behavior During Therapy [facility] for tasks assessed/performed             Past Medical History:  Diagnosis Date   Breast cancer ([place])    Diabetes mellitus without complication ([place])    Family history of colon cancer    History of radiation therapy    right breast/scv  05/10/21-07/01/21  Dr Gery Pray   Past Surgical History:  Procedure Laterality Date   BREAST BIOPSY Right 02/18/2021   Procedure: EVACUATION HEMATOMA RIGHT AXILLA;  Surgeon: [name], MD;  Location: [place];  Service: General;  Laterality: Right;   BREAST CYST EXCISION Right    Patient does not recall (2014 or 2015)   BREAST LUMPECTOMY WITH RADIOACTIVE SEED AND SENTINEL LYMPH NODE BIOPSY Right 02/07/2021   Procedure: RIGHT BREAST LUMPECTOMY WITH RADIOACTIVE SEED AND SEED TARGETED LYMPH NODE BIOPSY AND SENTINEL LYMPH NODE BIOPSY;  Surgeon: [name], MD;  Location: [place];  Service: General;  Laterality: Right;   CESAREAN SECTION     IR IMAGING GUIDED PORT INSERTION  09/15/2020   IR REMOVAL TUN CV CATH W/O FL  09/15/2020   NODE DISSECTION Right 03/28/2021   Procedure: RIGHT AXILLARY LYMPH NODE DISSECTION;  Surgeon: [name], MD;  Location: [place];  Service: General;  Laterality: Right;   PORT-A-CATH REMOVAL Left  02/07/2021   Procedure: REMOVAL PORT-A-CATH;  Surgeon: [name], MD;  Location: [place];  Service: General;  Laterality: Left;   THYROIDECTOMY, PARTIAL     Patient Active Problem List   Diagnosis Date Noted   Pap smear for cervical cancer screening 08/19/2021   Bartholin cyst 05/06/2021   Amenorrhea, secondary 03/02/2021   PICC (peripherally inserted central catheter) in place 09/09/2020   Genetic testing 08/11/2020   Iron deficiency anemia due to chronic blood loss 08/04/2020   Family history of colon cancer    Malignant neoplasm of upper-outer quadrant of right breast in female, estrogen receptor positive ([place]) 07/28/2020    REFERRING DIAG: right breast cancer  THERAPY DIAG:  Lymphedema, not elsewhere classified   Disorder of the skin and subcutaneous tissue related to radiation, unspecified   Aftercare following surgery for neoplasm   Stiffness of right shoulder, not elsewhere classified   Acute pain of right shoulder   Abnormal posture  PERTINENT HISTORY: Patient was diagnosed on 07/13/2020 with right grade 2 invasive ductal carcinoma breast cancer. It is ER/PR positive and HER2 negative with a Ki67 of 20%. Patient underwent neoadjuvant chemotherapy from 09/01/2020 - 01/14/2021. She had a right lumpectomy and sentinel node biopsy (1/1 nodes positive) on 02/07/2021. Hematoma evacuation on 02/18/2021. Axillary dissection on 03/28/2021 with 3/7 positive nodes. Iron deficiency. Radiation  05/10/2021-06/2021  Diagnosis of Type  2 diabetes in Nov. 2022.  Pt has had 2 steroid shots in right shoulder with only about 2 hours of relief.  She was advised not to have any more steroid shots due to her Diabetes  PRECAUTIONS: lymphedema risk  SUBJECTIVE: My Rt shoulder is really giving me a fit today.   PAIN:  Are you having pain? Yes NPRS scale: 8-10/10 Pain location: Rt shoulder, arm and hand Pain orientation: Right  PAIN TYPE: aching and pulling, aggravating Pain  description: constant  Aggravating factors: it just always hurts  Relieving factors: nothing    TODAY'S TREATMENT:  Today's treatment:  11/22/2021:  TE: Pulleys into flexion x2 mins and scaption x1 min, pt had to stop due to increased pain. Gently rocking ball roll seated next to it on mat table into scaption cuing pt throughout for full motion within pain tolerance; 3 mins MT: MLD: short neck, 5 diaphragmatic breaths, Lt axillary nodes and Right inguinal nodes, anterior inter-axillary and right axillo-inguinal anastomosis, then focused on Rt UE working from proximal to distal then retracing all steps.  Orthotic fit: Pt measured for a new custom flat knit compression sleeve and glove.   11/18/2021: pt appeared to have increased congestion in her rigt arm after wearing circular knit sleeve.  Discussed with Shan Levans, PT who recommended a flat knit sleeve and sent off demographics to [facility] to see if there is coverage for a flat knit sleeve and glove. Began with sitting ROM to neck and upper thoracic area with deep breathing followed by MLD: short neck, deep breathing right shoulder arm to hand and return along pathways. Right inguinal nodes, with right axillo-inguinal anastamosis as tolerated. PROM to right shoulder , elbow and forearm to counter the flexed, pronated and adducted position pt finds herself resting right arm in.Then, to left sidelying, for soft tissue work with cocoa butter to tight traps, posterior shoulder, latts and axilla that pt is having pain and tightness in. Pt had some palpable softening to tissue, but she reported she still had pain.      PT Short Term Goals - 10/06/21 1347       PT SHORT TERM GOAL #1   Title Pt will be able to have right shoulder flexion to > 125 degrees without pain so that she can perform her duties at home easider    Baseline 110 with pain on 10/06/2021    Time 4    Period Weeks    Status New      PT SHORT TERM GOAL #2   Title Pt will be  independent in a beginning home exercise program for strength    Time 4    Period Weeks    Status New              PT Long Term Goals - 10/06/21 1349       PT LONG TERM GOAL #1   Title Pt will decrease quick DASH score to < 50 indicating and improvement in right UE function    Baseline 68.18 on 10/06/2021    Time 8    Period Weeks    Status New      PT LONG TERM GOAL #2   Title Pt will increase number of repetitions of sit to stand in 30 seconds to > 11 indicating an improvment in functional mobiliyt and strength    Baseline 8 on 10/06/2021    Time 8    Period Weeks    Status New  PT LONG TERM GOAL #3   Title Pt will increase grip strength of right hand to > 10 pounds so that she can perform daily tasks easier    Baseline < 5 pounds on 10/06/2021      PT LONG TERM GOAL #4   Title Pt will be independent in a progressive resistive exercise program that she can do  in the gym at her apartment complex    Time 8    Period Weeks    Status New               Clinical Impression Statement   Pt comes in reporting increased pain today than she has been experiencing before. So did not tolerate pulleys as well and did ball roll on mat table working to decrease muscle guarding with AA/ROM. Then pt was measured for custom compression sleeve and glove. Brief MLD at end of session today. Order for garments was faxed and [employer], 20-30 mmHg was ordered.     Personal Factors and Comorbidities Comorbidity 3+     Comorbidities muliple surgeries, chemotherapy, new diagnosed DM2     Examination-Activity Limitations Reach Overhead;Lift;Carry;Caring for Others     Examination-Participation Restrictions Occupation;Cleaning;Laundry;Meal Prep     Stability/Clinical Decision Making Stable/Uncomplicated     Rehab Potential Good     PT Frequency 2x / week     PT Duration 8 weeks     PT Treatment/Interventions Therapeutic exercise;Patient/family education;Manual techniques;Manual lymph  drainage;Scar mobilization;Passive range of motion;ADLs/Self Care Home Management;Orthotic Fit/Training;Vasopneumatic Device;Taping     PT Next Visit Plan Review isometric Rt shoulder strengthening and dowel exs; Cont manual work to decrease pain and swelling in right upper quadrant and neck,cont MLD to Rt breast, progress exercise program prn, consider dry needling in neck pain??     PT Home Exercise Plan Post op shoulder ROM HEP, supine dowel exercises; isometrics for Rt shoulder     Consulted and Agree with Plan of Care Patient                   Patient will benefit from skilled therapeutic intervention in order to improve the following deficits and impairments:        Otelia Limes, PTA 11/22/2021, 10:07 AM

## 2021-11-23 ENCOUNTER — Telehealth: Payer: Self-pay

## 2021-11-23 NOTE — Telephone Encounter (Signed)
Notified Patient of completion of Disability Form for [employer]. Fax transmission confirmation received. Copy of form mailed to patient as requested. No other needs voiced at this time. ?

## 2021-11-24 ENCOUNTER — Other Ambulatory Visit: Payer: Self-pay

## 2021-11-24 ENCOUNTER — Ambulatory Visit: Payer: Medicaid Other

## 2021-11-24 DIAGNOSIS — I890 Lymphedema, not elsewhere classified: Secondary | ICD-10-CM | POA: Diagnosis not present

## 2021-11-24 DIAGNOSIS — R293 Abnormal posture: Secondary | ICD-10-CM

## 2021-11-24 DIAGNOSIS — M25611 Stiffness of right shoulder, not elsewhere classified: Secondary | ICD-10-CM

## 2021-11-24 DIAGNOSIS — Z483 Aftercare following surgery for neoplasm: Secondary | ICD-10-CM

## 2021-11-24 DIAGNOSIS — L599 Disorder of the skin and subcutaneous tissue related to radiation, unspecified: Secondary | ICD-10-CM

## 2021-11-24 DIAGNOSIS — M25511 Pain in right shoulder: Secondary | ICD-10-CM

## 2021-11-24 NOTE — Therapy (Signed)
OUTPATIENT PHYSICAL THERAPY TREATMENT NOTE   Patient Name: Melody Rodriguez MRN: [id number] DOB:1976/04/09, 46 y.o., female Today's Date: 11/24/2021  PCP: Patient, [name] REFERRING PROVIDER: Deanne Coffer*   PT End of Session - 11/24/21 0910     Visit Number 10    Number of Visits 17    Date for PT Re-Evaluation 12/05/21    Authorization Type [employer] - needs auth after 20 visits    Authorization - Visit Number 10    Authorization - Number of Visits 20    PT Start Time 0906    PT Stop Time 1001    PT Time Calculation (min) 55 min    Activity Tolerance Patient tolerated treatment well;Patient limited by pain    Behavior During Therapy [facility] for tasks assessed/performed             Past Medical History:  Diagnosis Date   Breast cancer ([place])    Diabetes mellitus without complication ([place])    Family history of colon cancer    History of radiation therapy    right breast/scv  05/10/21-07/01/21  Dr Gery Pray   Past Surgical History:  Procedure Laterality Date   BREAST BIOPSY Right 02/18/2021   Procedure: EVACUATION HEMATOMA RIGHT AXILLA;  Surgeon: [name], MD;  Location: [place];  Service: General;  Laterality: Right;   BREAST CYST EXCISION Right    Patient does not recall (2014 or 2015)   BREAST LUMPECTOMY WITH RADIOACTIVE SEED AND SENTINEL LYMPH NODE BIOPSY Right 02/07/2021   Procedure: RIGHT BREAST LUMPECTOMY WITH RADIOACTIVE SEED AND SEED TARGETED LYMPH NODE BIOPSY AND SENTINEL LYMPH NODE BIOPSY;  Surgeon: [name], MD;  Location: [place];  Service: General;  Laterality: Right;   CESAREAN SECTION     IR IMAGING GUIDED PORT INSERTION  09/15/2020   IR REMOVAL TUN CV CATH W/O FL  09/15/2020   NODE DISSECTION Right 03/28/2021   Procedure: RIGHT AXILLARY LYMPH NODE DISSECTION;  Surgeon: [name], MD;  Location: [place];  Service: General;  Laterality: Right;    PORT-A-CATH REMOVAL Left 02/07/2021   Procedure: REMOVAL PORT-A-CATH;  Surgeon: [name], MD;  Location: [place];  Service: General;  Laterality: Left;   THYROIDECTOMY, PARTIAL     Patient Active Problem List   Diagnosis Date Noted   Pap smear for cervical cancer screening 08/19/2021   Bartholin cyst 05/06/2021   Amenorrhea, secondary 03/02/2021   PICC (peripherally inserted central catheter) in place 09/09/2020   Genetic testing 08/11/2020   Iron deficiency anemia due to chronic blood loss 08/04/2020   Family history of colon cancer    Malignant neoplasm of upper-outer quadrant of right breast in female, estrogen receptor positive ([place]) 07/28/2020    REFERRING DIAG: right breast cancer  THERAPY DIAG:  Lymphedema, not elsewhere classified   Disorder of the skin and subcutaneous tissue related to radiation, unspecified   Aftercare following surgery for neoplasm   Stiffness of right shoulder, not elsewhere classified   Acute pain of right shoulder   Abnormal posture  PERTINENT HISTORY: Patient was diagnosed on 07/13/2020 with right grade 2 invasive ductal carcinoma breast cancer. It is ER/PR positive and HER2 negative with a Ki67 of 20%. Patient underwent neoadjuvant chemotherapy from 09/01/2020 - 01/14/2021. She had a right lumpectomy and sentinel node biopsy (1/1 nodes positive) on 02/07/2021. Hematoma evacuation on 02/18/2021. Axillary dissection on 03/28/2021 with 3/7 positive nodes. Iron deficiency. Radiation  05/10/2021-06/2021  Diagnosis of Type 2 diabetes in Nov. 2022.  Pt has had 2 steroid shots in right shoulder with only about 2 hours of relief.  She was advised not to have any more steroid shots due to her Diabetes  PRECAUTIONS: lymphedema risk  SUBJECTIVE: My Rt shoulder is feeling better than it's been the last few times. I had some spasms that woke me up last night in my shoulder that ran down into my arm. Not sure what caused that but I'm glad I  feel some better today.  PAIN:  Are you having pain? Yes NPRS scale: 6/10 Pain location: Rt shoulder, arm  Pain orientation: Right  PAIN TYPE: aching and pulling, aggravating Pain description: constant  Aggravating factors: it just always hurts  Relieving factors: nothing    TODAY'S TREATMENT:  Today's treatment:  11/24/2021: Manual therapy:  STM: Seated edge of bed with cocoa butter to Rt ant/post shoulder before P/ROM P/ROM: In Supine to Rt shoulder into flexion and scaption with multiple VCs during for relaxation due to muscle guarding TE:  Pulleys into flexion x2 mins and scaption x2 mins, improved tolerance for this today Ball roll seated on mat table into scaption x10 with VCs throughout to decrease Rt scapular compensation  11/22/2021:  TE: Pulleys into flexion x2 mins and scaption x1 min, pt had to stop due to increased pain. Gently rocking ball roll seated next to it on mat table into scaption cuing pt throughout for full motion within pain tolerance; 3 mins MT: MLD: short neck, 5 diaphragmatic breaths, Lt axillary nodes and Right inguinal nodes, anterior inter-axillary and right axillo-inguinal anastomosis, then focused on Rt UE working from proximal to distal then retracing all steps.  Orthotic fit: Pt measured for a new custom flat knit compression sleeve and glove.   11/18/2021: pt appeared to have increased congestion in her rigt arm after wearing circular knit sleeve.  Discussed with Shan Levans, PT who recommended a flat knit sleeve and sent off demographics to [facility] to see if there is coverage for a flat knit sleeve and glove. Began with sitting ROM to neck and upper thoracic area with deep breathing followed by MLD: short neck, deep breathing right shoulder arm to hand and return along pathways. Right inguinal nodes, with right axillo-inguinal anastamosis as tolerated. PROM to right shoulder , elbow and forearm to counter the flexed, pronated and adducted position pt finds  herself resting right arm in.Then, to left sidelying, for soft tissue work with cocoa butter to tight traps, posterior shoulder, latts and axilla that pt is having pain and tightness in. Pt had some palpable softening to tissue, but she reported she still had pain.      PT Short Term Goals - 10/06/21 1347       PT SHORT TERM GOAL #1   Title Pt will be able to have right shoulder flexion to > 125 degrees without pain so that she can perform her duties at home easider    Baseline 110 with pain on 10/06/2021    Time 4    Period Weeks    Status New      PT SHORT TERM GOAL #2   Title Pt will be independent in a beginning home exercise program for strength    Time 4    Period Weeks    Status New              PT Long Term Goals - 10/06/21 1349       PT  LONG TERM GOAL #1   Title Pt will decrease quick DASH score to < 50 indicating and improvement in right UE function    Baseline 68.18 on 10/06/2021    Time 8    Period Weeks    Status New      PT LONG TERM GOAL #2   Title Pt will increase number of repetitions of sit to stand in 30 seconds to > 11 indicating an improvment in functional mobiliyt and strength    Baseline 8 on 10/06/2021    Time 8    Period Weeks    Status New      PT LONG TERM GOAL #3   Title Pt will increase grip strength of right hand to > 10 pounds so that she can perform daily tasks easier    Baseline < 5 pounds on 10/06/2021      PT LONG TERM GOAL #4   Title Pt will be independent in a progressive resistive exercise program that she can do  in the gym at her apartment complex    Time 8    Period Weeks    Status New               Clinical Impression Statement   Pt comes in reporting some reduction in pain from last session. Her tolerance for the pulleys was improved today due to this. Also some increase noted with end flexion with P/ROM by end of session.    Personal Factors and Comorbidities Comorbidity 3+     Comorbidities muliple surgeries,  chemotherapy, new diagnosed DM2     Examination-Activity Limitations Reach Overhead;Lift;Carry;Caring for Others     Examination-Participation Restrictions Occupation;Cleaning;Laundry;Meal Prep     Stability/Clinical Decision Making Stable/Uncomplicated     Rehab Potential Good     PT Frequency 2x / week     PT Duration 8 weeks     PT Treatment/Interventions Therapeutic exercise;Patient/family education;Manual techniques;Manual lymph drainage;Scar mobilization;Passive range of motion;ADLs/Self Care Home Management;Orthotic Fit/Training;Vasopneumatic Device;Taping     PT Next Visit Plan Review isometric Rt shoulder strengthening and dowel exs; Cont manual work to decrease pain and swelling in right upper quadrant and neck,cont MLD to Rt breast, progress exercise program prn, consider dry needling in neck pain??     PT Home Exercise Plan Post op shoulder ROM HEP, supine dowel exercises; isometrics for Rt shoulder     Consulted and Agree with Plan of Care Patient                   Patient will benefit from skilled therapeutic intervention in order to improve the following deficits and impairments:        Otelia Limes, PTA 11/24/2021, 9:58 AM

## 2021-11-25 ENCOUNTER — Other Ambulatory Visit (HOSPITAL_COMMUNITY): Payer: Self-pay

## 2021-11-28 ENCOUNTER — Other Ambulatory Visit (HOSPITAL_COMMUNITY): Payer: Self-pay

## 2021-11-29 ENCOUNTER — Ambulatory Visit: Payer: Medicaid Other

## 2021-11-29 ENCOUNTER — Other Ambulatory Visit: Payer: Self-pay

## 2021-11-29 DIAGNOSIS — Z483 Aftercare following surgery for neoplasm: Secondary | ICD-10-CM

## 2021-11-29 DIAGNOSIS — M25611 Stiffness of right shoulder, not elsewhere classified: Secondary | ICD-10-CM

## 2021-11-29 DIAGNOSIS — I890 Lymphedema, not elsewhere classified: Secondary | ICD-10-CM | POA: Diagnosis not present

## 2021-11-29 DIAGNOSIS — M25511 Pain in right shoulder: Secondary | ICD-10-CM

## 2021-11-29 DIAGNOSIS — R293 Abnormal posture: Secondary | ICD-10-CM

## 2021-11-29 DIAGNOSIS — L599 Disorder of the skin and subcutaneous tissue related to radiation, unspecified: Secondary | ICD-10-CM

## 2021-11-29 NOTE — Patient Instructions (Signed)

## 2021-11-29 NOTE — Therapy (Signed)
?OUTPATIENT PHYSICAL THERAPY TREATMENT NOTE ? ? ?Patient Name: Melody Rodriguez ?MRN: [id number] ?DOB:06/27/1976, 46 y.o., female ?Today's Date: 11/29/2021 ? ?PCP: Patient, [name] ?REFERRING PROVIDER: Wilber Bihari Cornett* ? ? PT End of Session - 11/29/21 0909   ? ? Visit Number 11   ? Number of Visits 17   ? Date for PT Re-Evaluation 12/05/21   ? Authorization Type [employer] - needs auth after 20 visits   ? Authorization - Visit Number 11   ? Authorization - Number of Visits 20   ? PT Start Time [phone]   ? PT Stop Time 1003   ? PT Time Calculation (min) 56 min   ? Activity Tolerance Patient tolerated treatment well;Patient limited by pain   ? Behavior During Therapy [facility] for tasks assessed/performed   ? ?  ?  ? ?  ? ? ?Past Medical History:  ?Diagnosis Date  ? Breast cancer ([place])   ? Diabetes mellitus without complication ([place])   ? Family history of colon cancer   ? History of radiation therapy   ? right breast/scv  05/10/21-07/01/21  Dr Gery Pray  ? ?Past Surgical History:  ?Procedure Laterality Date  ? BREAST BIOPSY Right 02/18/2021  ? Procedure: EVACUATION HEMATOMA RIGHT AXILLA;  Surgeon: [name], MD;  Location: [place];  Service: General;  Laterality: Right;  ? BREAST CYST EXCISION Right   ? Patient does not recall (2014 or 2015)  ? BREAST LUMPECTOMY WITH RADIOACTIVE SEED AND SENTINEL LYMPH NODE BIOPSY Right 02/07/2021  ? Procedure: RIGHT BREAST LUMPECTOMY WITH RADIOACTIVE SEED AND SEED TARGETED LYMPH NODE BIOPSY AND SENTINEL LYMPH NODE BIOPSY;  Surgeon: [name], MD;  Location: [place];  Service: General;  Laterality: Right;  ? CESAREAN SECTION    ? IR IMAGING GUIDED PORT INSERTION  09/15/2020  ? IR REMOVAL TUN CV CATH W/O FL  09/15/2020  ? NODE DISSECTION Right 03/28/2021  ? Procedure: RIGHT AXILLARY LYMPH NODE DISSECTION;  Surgeon: [name], MD;  Location: [place];  Service: General;  Laterality: Right;  ?  PORT-A-CATH REMOVAL Left 02/07/2021  ? Procedure: REMOVAL PORT-A-CATH;  Surgeon: [name], MD;  Location: [place];  Service: General;  Laterality: Left;  ? THYROIDECTOMY, PARTIAL    ? ?Patient Active Problem List  ? Diagnosis Date Noted  ? Pap smear for cervical cancer screening 08/19/2021  ? Bartholin cyst 05/06/2021  ? Amenorrhea, secondary 03/02/2021  ? PICC (peripherally inserted central catheter) in place 09/09/2020  ? Genetic testing 08/11/2020  ? Iron deficiency anemia due to chronic blood loss 08/04/2020  ? Family history of colon cancer   ? Malignant neoplasm of upper-outer quadrant of right breast in female, estrogen receptor positive ([place]) 07/28/2020  ? ? ?REFERRING DIAG: right breast cancer ? ?THERAPY DIAG:  ?Lymphedema, not elsewhere classified ?  ?Disorder of the skin and subcutaneous tissue related to radiation, unspecified ?  ?Aftercare following surgery for neoplasm ?  ?Stiffness of right shoulder, not elsewhere classified ?  ?Acute pain of right shoulder ?  ?Abnormal posture ? ?PERTINENT HISTORY: Patient was diagnosed on 07/13/2020 with right grade 2 invasive ductal carcinoma breast cancer. It is ER/PR positive and HER2 negative with a Ki67 of 20%. Patient underwent neoadjuvant chemotherapy from 09/01/2020 - 01/14/2021. She had a right lumpectomy and sentinel node biopsy (1/1 nodes positive) on 02/07/2021. Hematoma evacuation on 02/18/2021. Axillary dissection on 03/28/2021 with 3/7 positive nodes. Iron deficiency. Radiation  05/10/2021-06/2021  Diagnosis of Type 2 diabetes in Nov. 2022.  Pt has had 2 steroid shots in right shoulder with only about 2 hours of relief.  She was advised not to have any more steroid shots due to her Diabetes ? ?PRECAUTIONS: lymphedema risk ? ?SUBJECTIVE: My Rt arm doesn't just feel like it's part of my body. It does feel better after I leave here for a day or so. I am noticing improvements though because I'm not keeping my arm as close into my body  as I was (was on my chest, now I find it resting on my stomach). And my ROM is overall better than it was, my Rt shoulder doesn't feel as bound down as it was before.  ?PAIN:  ?Are you having pain? Yes ?NPRS scale: 7/10 ?Pain location: Rt shoulder, arm  ?Pain orientation: Right  ?PAIN TYPE: aching and pulling, aggravating ?Pain description: constant  ?Aggravating factors: it just always hurts  ?Relieving factors: nothing ? ? ? ?TODAY'S TREATMENT:  ?Today's treatment:  ?11/29/2021: ?TE: ?Supine: shoulder AA/ROM with dowel into flexion 5x, 5 sec holds returning therapist demo, then same for Rt er and abd all 10x, 5 sec holds; then with dowel protraction and retraction and CW/CCW circles to tolerance ?Manual therapy:  ?STM: Seated edge of bed with cocoa butter to Rt ant/post shoulder before P/ROM ?P/ROM: In Supine to Rt shoulder into flexion and scaption with multiple VCs during for relaxation due to muscle guarding ? ?11/24/2021: ?Manual therapy:  ?STM: Seated edge of bed with cocoa butter to Rt ant/post shoulder before P/ROM ?P/ROM: In Supine to Rt shoulder into flexion and scaption with multiple VCs during for relaxation due to muscle guarding ?TE:  ?Pulleys into flexion x2 mins and scaption x2 mins, improved tolerance for this today ?Ball roll seated on mat table into scaption x10 with VCs throughout to decrease Rt scapular compensation ? ?11/22/2021:  ?TE: Pulleys into flexion x2 mins and scaption x1 min, pt had to stop due to increased pain. ?Gently rocking ball roll seated next to it on mat table into scaption cuing pt throughout for full motion within pain tolerance; 3 mins ?MT: MLD: short neck, 5 diaphragmatic breaths, Lt axillary nodes and Right inguinal nodes, anterior inter-axillary and right axillo-inguinal anastomosis, then focused on Rt UE working from proximal to distal then retracing all steps.  ?Orthotic fit: Pt measured for a new custom flat knit compression sleeve and glove.  ? ?HEP issued  11/29/2021: ?Access Code: [email or web address] ?URL: [email or web address] ?Date: 11/29/2021 ?Prepared by: Collie Siad ? ?Exercises ?Supine Shoulder Protraction with Dowel - 2 x daily - 7 x weekly - 1 sets - 10 reps ?Supine Shoulder Horizontal Abduction Adduction AAROM with Dowel - 2 x daily - 7 x weekly - 1 sets - 10 reps ?Supine Shoulder Circles with Dowel Clockwise - 2 x daily - 7 x weekly - 1 sets - 10 reps ?Supine Shoulder Flexion Extension AAROM with Dowel - 1 x daily - 7 x weekly - 1 sets - 10 reps - 5 hold ? ?PATIENT EDUCATION: ?Education details: yes ?Person educated: Patient ?Education method: Explanation, Demonstration, and Handouts ?Education comprehension: verbalized understanding, returned demonstration, and tactile cues required  ? ? ? ? PT Short Term Goals - 10/06/21 1347   ? ?  ? PT SHORT TERM GOAL #1  ? Title Pt will be able to have right shoulder flexion to > 125 degrees without pain so that she can perform her duties at home easider   ? Baseline  110 with pain on 10/06/2021   ? Time 4   ? Period Weeks   ? Status New   ?  ? PT SHORT TERM GOAL #2  ? Title Pt will be independent in a beginning home exercise program for strength   ? Time 4   ? Period Weeks   ? Status New   ? ?  ?  ? ?  ? ? ? PT Long Term Goals - 10/06/21 1349   ? ?  ? PT LONG TERM GOAL #1  ? Title Pt will decrease quick DASH score to < 50 indicating and improvement in right UE function   ? Baseline 68.18 on 10/06/2021   ? Time 8   ? Period Weeks   ? Status New   ?  ? PT LONG TERM GOAL #2  ? Title Pt will increase number of repetitions of sit to stand in 30 seconds to > 11 indicating an improvment in functional mobiliyt and strength   ? Baseline 8 on 10/06/2021   ? Time 8   ? Period Weeks   ? Status New   ?  ? PT LONG TERM GOAL #3  ? Title Pt will increase grip strength of right hand to > 10 pounds so that she can perform daily tasks easier   ? Baseline < 5 pounds on 10/06/2021   ?  ? PT LONG TERM GOAL #4  ? Title Pt will be  independent in a progressive resistive exercise program that she can do  in the gym at her apartment complex   ? Time 8   ? Period Weeks   ? Status New   ? ?  ?  ? ?  ? ? ? ? Clinical Impression Statement ?  Though progr

## 2021-11-30 ENCOUNTER — Other Ambulatory Visit (HOSPITAL_COMMUNITY): Payer: Self-pay

## 2021-12-01 ENCOUNTER — Ambulatory Visit: Payer: Medicaid Other

## 2021-12-01 ENCOUNTER — Other Ambulatory Visit: Payer: Self-pay

## 2021-12-01 DIAGNOSIS — I890 Lymphedema, not elsewhere classified: Secondary | ICD-10-CM | POA: Diagnosis not present

## 2021-12-01 NOTE — Therapy (Signed)
?OUTPATIENT PHYSICAL THERAPY TREATMENT NOTE ? ? ?Patient Name: Melody Rodriguez ?MRN: [id number] ?DOB:1976/07/26, 46 y.o., female ?Today's Date: 12/01/2021 ? ?PCP: Patient, [name] ?REFERRING PROVIDER: Wilber Bihari Cornett* ? ? PT End of Session - 12/01/21 0925   ? ? Visit Number 12   ? Number of Visits 17   ? Date for PT Re-Evaluation 12/05/21   ? Authorization Type [employer] - needs auth after 20 visits   ? Authorization - Visit Number 12   ? Authorization - Number of Visits 20   ? PT Start Time [phone]   ? PT Stop Time 1002   ? PT Time Calculation (min) 55 min   ? Activity Tolerance Patient tolerated treatment well;Patient limited by pain   ? Behavior During Therapy [facility] for tasks assessed/performed   ? ?  ?  ? ?  ? ? ? ?Past Medical History:  ?Diagnosis Date  ? Breast cancer ([place])   ? Diabetes mellitus without complication ([place])   ? Family history of colon cancer   ? History of radiation therapy   ? right breast/scv  05/10/21-07/01/21  Dr Gery Pray  ? ?Past Surgical History:  ?Procedure Laterality Date  ? BREAST BIOPSY Right 02/18/2021  ? Procedure: EVACUATION HEMATOMA RIGHT AXILLA;  Surgeon: [name], MD;  Location: [place];  Service: General;  Laterality: Right;  ? BREAST CYST EXCISION Right   ? Patient does not recall (2014 or 2015)  ? BREAST LUMPECTOMY WITH RADIOACTIVE SEED AND SENTINEL LYMPH NODE BIOPSY Right 02/07/2021  ? Procedure: RIGHT BREAST LUMPECTOMY WITH RADIOACTIVE SEED AND SEED TARGETED LYMPH NODE BIOPSY AND SENTINEL LYMPH NODE BIOPSY;  Surgeon: [name], MD;  Location: [place];  Service: General;  Laterality: Right;  ? CESAREAN SECTION    ? IR IMAGING GUIDED PORT INSERTION  09/15/2020  ? IR REMOVAL TUN CV CATH W/O FL  09/15/2020  ? NODE DISSECTION Right 03/28/2021  ? Procedure: RIGHT AXILLARY LYMPH NODE DISSECTION;  Surgeon: [name], MD;  Location: [place];  Service: General;  Laterality: Right;  ?  PORT-A-CATH REMOVAL Left 02/07/2021  ? Procedure: REMOVAL PORT-A-CATH;  Surgeon: [name], MD;  Location: [place];  Service: General;  Laterality: Left;  ? THYROIDECTOMY, PARTIAL    ? ?Patient Active Problem List  ? Diagnosis Date Noted  ? Pap smear for cervical cancer screening 08/19/2021  ? Bartholin cyst 05/06/2021  ? Amenorrhea, secondary 03/02/2021  ? PICC (peripherally inserted central catheter) in place 09/09/2020  ? Genetic testing 08/11/2020  ? Iron deficiency anemia due to chronic blood loss 08/04/2020  ? Family history of colon cancer   ? Malignant neoplasm of upper-outer quadrant of right breast in female, estrogen receptor positive ([place]) 07/28/2020  ? ? ?REFERRING DIAG: right breast cancer ? ?THERAPY DIAG:  ?Lymphedema, not elsewhere classified ?  ?Disorder of the skin and subcutaneous tissue related to radiation, unspecified ?  ?Aftercare following surgery for neoplasm ?  ?Stiffness of right shoulder, not elsewhere classified ?  ?Acute pain of right shoulder ?  ?Abnormal posture ? ?PERTINENT HISTORY: Patient was diagnosed on 07/13/2020 with right grade 2 invasive ductal carcinoma breast cancer. It is ER/PR positive and HER2 negative with a Ki67 of 20%. Patient underwent neoadjuvant chemotherapy from 09/01/2020 - 01/14/2021. She had a right lumpectomy and sentinel node biopsy (1/1 nodes positive) on 02/07/2021. Hematoma evacuation on 02/18/2021. Axillary dissection on 03/28/2021 with 3/7 positive nodes. Iron deficiency. Radiation  05/10/2021-06/2021  Diagnosis of Type 2 diabetes in Nov. 2022.  Pt has had 2 steroid shots in right shoulder with only about 2 hours of relief.  She was advised not to have any more steroid shots due to her Diabetes ? ?PRECAUTIONS: lymphedema risk ? ?SUBJECTIVE: Yesterday my Rt arm was really hurting but today is a good day. I was able, though, to sleep really good last night. The pain didn't wake me up and I woke up with my arm in a little bit of er which  hasn't happened in a long time.  ? ?PAIN:  ?Are you having pain? Yes ?NPRS scale: 3/10 ?Pain location: Rt shoulder, arm  ?Pain orientation: Right  ?PAIN TYPE: aching ?Pain description: constant  ?Aggravating factors: it just always hurts  ?Relieving factors: therapy overall has helped to loosen up the tightness ? ? ?TODAY'S TREATMENT:  ?Today's treatment:  ?12/01/2021: ?TE:  ?Pulleys: Flexion x2 mins and scaption x1 min ?Ball roll on mat table into flexion x12 ?Leaning on counter for pendulums side to side and practice just letting arm hang, pt is not able to completely let arm go into relaxed position due to muscle guarding ?Manual therapy:  ?STM: Supine with cocoa butter to Rt ant/post shoulder before P/ROM and Rt upper trap area ?P/ROM: In Supine to Rt shoulder into flexion and scaption with multiple VCs during for relaxation due to muscle guarding ?MLD: In Supine: Short nect, 5 diaphragmatic breaths, Lt axillary and Rt inguinal nodes, anterior inter-axillary and Rt axillo-inguinal anastomosis, then Rt upper arm redirecting towards anastomosis.  ? ?11/29/2021: ?TE: ?Supine: shoulder AA/ROM with dowel into flexion 5x, 5 sec holds returning therapist demo, then same for Rt er and abd all 10x, 5 sec holds; then with dowel protraction and retraction and CW/CCW circles to tolerance ?Manual therapy:  ?STM: Seated edge of bed with cocoa butter to Rt ant/post shoulder before P/ROM ?P/ROM: In Supine to Rt shoulder into flexion and scaption with multiple VCs during for relaxation due to muscle guarding ? ?11/24/2021: ?Manual therapy:  ?STM: Seated edge of bed with cocoa butter to Rt ant/post shoulder before P/ROM ?P/ROM: In Supine to Rt shoulder into flexion and scaption with multiple VCs during for relaxation due to muscle guarding ?TE:  ?Pulleys into flexion x2 mins and scaption x2 mins, improved tolerance for this today ?Ball roll seated on mat table into scaption x10 with VCs throughout to decrease Rt scapular  compensation ? ? ?HEP issued 11/29/2021: ?Access Code: [email or web address] ?URL: [email or web address] ?Date: 11/29/2021 ?Prepared by: Collie Siad ? ?Exercises ?Supine Shoulder Protraction with Dowel - 2 x daily - 7 x weekly - 1 sets - 10 reps ?Supine Shoulder Horizontal Abduction Adduction AAROM with Dowel - 2 x daily - 7 x weekly - 1 sets - 10 reps ?Supine Shoulder Circles with Dowel Clockwise - 2 x daily - 7 x weekly - 1 sets - 10 reps ?Supine Shoulder Flexion Extension AAROM with Dowel - 1 x daily - 7 x weekly - 1 sets - 10 reps - 5 hold ? ?PATIENT EDUCATION: ?Education details: yes ?Person educated: Patient ?Education method: Explanation, Demonstration, and Handouts ?Education comprehension: verbalized understanding, returned demonstration, and tactile cues required  ? ? ? ? PT Short Term Goals - 10/06/21 1347   ? ?  ? PT SHORT TERM GOAL #1  ? Title Pt will be able to have right shoulder flexion to > 125 degrees without pain so that she can perform her duties at home easider   ? Baseline  110 with pain on 10/06/2021   ? Time 4   ? Period Weeks   ? Status New   ?  ? PT SHORT TERM GOAL #2  ? Title Pt will be independent in a beginning home exercise program for strength   ? Time 4   ? Period Weeks   ? Status New   ? ?  ?  ? ?  ? ? ? PT Long Term Goals - 10/06/21 1349   ? ?  ? PT LONG TERM GOAL #1  ? Title Pt will decrease quick DASH score to < 50 indicating and improvement in right UE function   ? Baseline 68.18 on 10/06/2021   ? Time 8   ? Period Weeks   ? Status New   ?  ? PT LONG TERM GOAL #2  ? Title Pt will increase number of repetitions of sit to stand in 30 seconds to > 11 indicating an improvment in functional mobiliyt and strength   ? Baseline 8 on 10/06/2021   ? Time 8   ? Period Weeks   ? Status New   ?  ? PT LONG TERM GOAL #3  ? Title Pt will increase grip strength of right hand to > 10 pounds so that she can perform daily tasks easier   ? Baseline < 5 pounds on 10/06/2021   ?  ? PT LONG TERM GOAL  #4  ? Title Pt will be independent in a progressive resistive exercise program that she can do  in the gym at her apartment complex   ? Time 8   ? Period Weeks   ? Status New   ? ?  ?  ? ?  ? ? ? ? Clinical Imp

## 2021-12-06 ENCOUNTER — Encounter: Payer: Self-pay | Admitting: Adult Health

## 2021-12-06 ENCOUNTER — Telehealth: Payer: Self-pay | Admitting: Hematology and Oncology

## 2021-12-06 ENCOUNTER — Ambulatory Visit: Payer: Medicaid Other

## 2021-12-06 ENCOUNTER — Other Ambulatory Visit: Payer: Self-pay

## 2021-12-06 DIAGNOSIS — L599 Disorder of the skin and subcutaneous tissue related to radiation, unspecified: Secondary | ICD-10-CM

## 2021-12-06 DIAGNOSIS — M25611 Stiffness of right shoulder, not elsewhere classified: Secondary | ICD-10-CM

## 2021-12-06 DIAGNOSIS — R293 Abnormal posture: Secondary | ICD-10-CM

## 2021-12-06 DIAGNOSIS — I890 Lymphedema, not elsewhere classified: Secondary | ICD-10-CM

## 2021-12-06 DIAGNOSIS — Z483 Aftercare following surgery for neoplasm: Secondary | ICD-10-CM

## 2021-12-06 DIAGNOSIS — M25511 Pain in right shoulder: Secondary | ICD-10-CM

## 2021-12-06 NOTE — Telephone Encounter (Signed)
.  Called pt per 3/21 inbasket , Patient was unavailable, a message with appt time and date was left with number on file.   ?

## 2021-12-06 NOTE — Therapy (Addendum)
?OUTPATIENT PHYSICAL THERAPY TREATMENT NOTE ? ? ?Patient Name: Melody Rodriguez ?MRN: [id number] ?DOB:12-26-1975, 46 y.o., female ?Today's Date: 12/06/2021 ? ?PCP: Patient, [name] ?REFERRING PROVIDER: Wilber Bihari Cornett* ? ? PT End of Session - 12/06/21 1049   ? ? Visit Number 13   ? Number of Visits 17   ? Date for PT Re-Evaluation 12/05/21   D/C this visit  ? Authorization Type [employer] - needs auth after 20 visits   ? Authorization - Visit Number 13   ? Authorization - Number of Visits 20   ? PT Start Time [phone]   ? PT Stop Time 0947   pt requested end session early today  ? PT Time Calculation (min) 43 min   ? Activity Tolerance Patient tolerated treatment well;Patient limited by pain   ? Behavior During Therapy [facility] for tasks assessed/performed   ? ?  ?  ? ?  ? ? ? ? ?Past Medical History:  ?Diagnosis Date  ? Breast cancer ([place])   ? Diabetes mellitus without complication ([name])   ? Family history of colon cancer   ? History of radiation therapy   ? right breast/scv  05/10/21-07/01/21  Dr Gery Pray  ? ?Past Surgical History:  ?Procedure Laterality Date  ? BREAST BIOPSY Right 02/18/2021  ? Procedure: EVACUATION HEMATOMA RIGHT AXILLA;  Surgeon: [name], MD;  Location: [place];  Service: General;  Laterality: Right;  ? BREAST CYST EXCISION Right   ? Patient does not recall (2014 or 2015)  ? BREAST LUMPECTOMY WITH RADIOACTIVE SEED AND SENTINEL LYMPH NODE BIOPSY Right 02/07/2021  ? Procedure: RIGHT BREAST LUMPECTOMY WITH RADIOACTIVE SEED AND SEED TARGETED LYMPH NODE BIOPSY AND SENTINEL LYMPH NODE BIOPSY;  Surgeon: [name], MD;  Location: [place];  Service: General;  Laterality: Right;  ? CESAREAN SECTION    ? IR IMAGING GUIDED PORT INSERTION  09/15/2020  ? IR REMOVAL TUN CV CATH W/O FL  09/15/2020  ? NODE DISSECTION Right 03/28/2021  ? Procedure: RIGHT AXILLARY LYMPH NODE DISSECTION;  Surgeon: [name], MD;  Location: [place];  Service: General;  Laterality: Right;  ? PORT-A-CATH REMOVAL Left 02/07/2021  ? Procedure: REMOVAL PORT-A-CATH;  Surgeon: [name], MD;  Location: [place];  Service: General;  Laterality: Left;  ? THYROIDECTOMY, PARTIAL    ? ?Patient Active Problem List  ? Diagnosis Date Noted  ? Pap smear for cervical cancer screening 08/19/2021  ? Bartholin cyst 05/06/2021  ? Amenorrhea, secondary 03/02/2021  ? PICC (peripherally inserted central catheter) in place 09/09/2020  ? Genetic testing 08/11/2020  ? Iron deficiency anemia due to chronic blood loss 08/04/2020  ? Family history of colon cancer   ? Malignant neoplasm of upper-outer quadrant of right breast in female, estrogen receptor positive ([place]) 07/28/2020  ? ? ?REFERRING DIAG: right breast cancer ? ?THERAPY DIAG:  ?Lymphedema, not elsewhere classified ?  ?Disorder of the skin and subcutaneous tissue related to radiation, unspecified ?  ?Aftercare following surgery for neoplasm ?  ?Stiffness of right shoulder, not elsewhere classified ?  ?Acute pain of right shoulder ?  ?Abnormal posture ? ?PERTINENT HISTORY: Patient was diagnosed on 07/13/2020 with right grade 2 invasive ductal carcinoma breast cancer. It is ER/PR positive and HER2 negative with a Ki67 of 20%. Patient underwent neoadjuvant chemotherapy from 09/01/2020 - 01/14/2021. She had a right lumpectomy and sentinel node biopsy (1/1 nodes positive) on 02/07/2021. Hematoma evacuation on 02/18/2021. Axillary  dissection on 03/28/2021 with 3/7 positive nodes. Iron deficiency. Radiation  05/10/2021-06/2021  Diagnosis of Type 2 diabetes in Nov. 2022.  Pt has had 2 steroid shots in right shoulder with only about 2 hours of relief.  She was advised not to have any more steroid shots due to her Diabetes ? ?PRECAUTIONS: lymphedema risk ? ?SUBJECTIVE: I had so much trouble sleeping last night. My Rt shoulder kept waking me up and spasming. I am still having good days and bad days but I  feel like we have plateaued again.   ? ?PAIN:  ?Are you having pain? Yes ?NPRS scale: 7/10 ?Pain location: Rt shoulder, arm  ?Pain orientation: Right  ?PAIN TYPE: aching ?Pain description: constant  ?Aggravating factors: it just always hurts  ?Relieving factors: therapy overall has helped to loosen up the tightness ? ? ?TODAY'S TREATMENT:  ?Today's treatment:  ?12/06/21: ?Self Care:  Spent session discussing pts current functional status and assessing goals. Encouraged her to cont working on increasing time spent on daily walking program as she reports her hips have been bothering her more with getting up from prolonged sitting. Verbally reviewed current HEP and discussed ways of continuing with this and progressing at home. ?12/01/2021: ?TE:  ?Pulleys: Flexion x2 mins and scaption x1 min ?Ball roll on mat table into flexion x12 ?Leaning on counter for pendulums side to side and practice just letting arm hang, pt is not able to completely let arm go into relaxed position due to muscle guarding ?Manual therapy:  ?STM: Supine with cocoa butter to Rt ant/post shoulder before P/ROM and Rt upper trap area ?P/ROM: In Supine to Rt shoulder into flexion and scaption with multiple VCs during for relaxation due to muscle guarding ?MLD: In Supine: Short nect, 5 diaphragmatic breaths, Lt axillary and Rt inguinal nodes, anterior inter-axillary and Rt axillo-inguinal anastomosis, then Rt upper arm redirecting towards anastomosis.  ? ?11/29/2021: ?TE: ?Supine: shoulder AA/ROM with dowel into flexion 5x, 5 sec holds returning therapist demo, then same for Rt er and abd all 10x, 5 sec holds; then with dowel protraction and retraction and CW/CCW circles to tolerance ?Manual therapy:  ?STM: Seated edge of bed with cocoa butter to Rt ant/post shoulder before P/ROM ?P/ROM: In Supine to Rt shoulder into flexion and scaption with multiple VCs during for relaxation due to muscle guarding ? ? ?HEP issued 11/29/2021: ?Access Code: [email or web address] ?URL:  [email or web address] ?Date: 11/29/2021 ?Prepared by: Collie Siad ? ?Exercises ?Supine Shoulder Protraction with Dowel - 2 x daily - 7 x weekly - 1 sets - 10 reps ?Supine Shoulder Horizontal Abduction Adduction AAROM with Dowel - 2 x daily - 7 x weekly - 1 sets - 10 reps ?Supine Shoulder Circles with Dowel Clockwise - 2 x daily - 7 x weekly - 1 sets - 10 reps ?Supine Shoulder Flexion Extension AAROM with Dowel - 1 x daily - 7 x weekly - 1 sets - 10 reps - 5 hold ? ?PATIENT EDUCATION: ?Education details: yes ?Person educated: Patient ?Education method: Explanation, Demonstration, and Handouts ?Education comprehension: verbalized understanding, returned demonstration, and tactile cues required  ? ?Katina Dung - 10/06/21 0001  12/06/2021  ?  ?  Open a tight or new jar Moderate difficulty - 3 3  ? Do heavy household chores (wash walls, wash floors) Unable  - 5 3  ?  Carry a shopping bag or briefcase Moderate difficulty - 3  3  ?  Wash your back Unable - 5 4  ?  Use a  knife to cut food Mild difficulty - 2   2  ?  Recreational activities in which you take some force or impact through your arm, shoulder, or hand (golf, hammering, tennis) Unable - 5 4  ?  During the past week, to what extent has your arm, shoulder or hand problem interfered with your normal social activities with family, friends, neighbors, or groups? Modererately - 3  3  ?  During the past week, to what extent has your arm, shoulder or hand problem limited your work or other regular daily activities Modererately  - 3 3  ?  Arm, shoulder, or hand pain. Severe - 5 4  ?  Tingling (pins and needles) in your arm, shoulder, or hand Moderate - 3 5  ?  Difficulty Sleeping So much difficuSo much difficulty, I can't sleep - 5 5  ?  DASH Score 68.18 %  63.64  ? ?AROM                    10/06/21 12/06/21  ?  AROM Assessment Site Shoulder   ?  Right Shoulder Extension 30 Degrees    ?  Right Shoulder Flexion 110 Degrees  ~120 degrees (P/ROM)  ?  Right  Shoulder ABduction 100 Degrees  ~101 degrees (P/ROM)  ? ? ? PT Short Term Goals - 10/06/21 1347   ? ?  ? PT SHORT TERM GOAL #1  ? Title Pt will be able to have right shoulder flexion to > 125 degrees wit

## 2021-12-08 ENCOUNTER — Inpatient Hospital Stay (HOSPITAL_BASED_OUTPATIENT_CLINIC_OR_DEPARTMENT_OTHER): Payer: Medicaid Other | Admitting: Adult Health

## 2021-12-08 ENCOUNTER — Other Ambulatory Visit: Payer: Self-pay | Admitting: Adult Health

## 2021-12-08 ENCOUNTER — Encounter: Payer: Self-pay | Admitting: Adult Health

## 2021-12-08 DIAGNOSIS — C50411 Malignant neoplasm of upper-outer quadrant of right female breast: Secondary | ICD-10-CM | POA: Diagnosis not present

## 2021-12-08 DIAGNOSIS — M79601 Pain in right arm: Secondary | ICD-10-CM

## 2021-12-08 DIAGNOSIS — Z170 Estrogen receptor positive status [ER+]: Secondary | ICD-10-CM

## 2021-12-08 NOTE — Progress Notes (Signed)
Melody Rodriguez Follow up: ?  ? ?Patient, [name] ?No address on file ? ?I connected with Melody Rodriguez on 12/08/21 at  1:15 PM EDT by MyChart video and verified that I am speaking with the correct person using two identifiers. I discussed the limitations, risks, security and privacy concerns of performing an evaluation and management service by telephone and the availability of in person appointments. I also discussed with the patient that there may be a patient responsible charge related to this service. The patient expressed understanding and agreed to proceed.  ? ?Patient location: Home ?Provider location: [facility] office ?Others participating in call: None ? ? ? ?DIAGNOSIS:  Rodriguez Staging  ?Malignant neoplasm of upper-outer quadrant of right breast in female, estrogen receptor positive ([place]) ?Staging form: Breast, AJCC 8th Edition ?- Clinical stage from 08/04/2020: Stage IIA (cT2, cN1, cM0, G2, ER+, PR+, HER2-) - Unsigned ?Stage prefix: Initial diagnosis ?Histologic grading system: 3 grade system ? ? ?SUMMARY OF ONCOLOGIC HISTORY: ?Oncology History  ?Malignant neoplasm of upper-outer quadrant of right breast in female, estrogen receptor positive (Melody Rodriguez)  ?07/28/2020 Initial Diagnosis  ? Patient palpated a right breast mass for 1-2 years. Mammogram showed a 2.2cm mass at the 11 o'clock position with surrounding calcifications, 6.4cm in total extent, and up to 5 abnormal right axillary lymph nodes. 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  ?  ?08/05/2020 Miscellaneous  ? MammaPrint: High risk luminal type B ?  ?08/11/2020 Genetic Testing  ? Negative genetic testing: no pathogenic variants detected in [facility] Breast Rodriguez STAT Panel or Common Hereditary Cancers panel. The report dates are August 11, 2020 and August 19, 2020, respectively. Two  variants of uncertain signficance were detected - one in the CTNNA1 gene called c.86del and the second in the MLH1 gene called c.808A>G.  ? ?UPDATE:  The MLH1 c.808A>G VUS was reclassified to "Likely Benign" on 02/19/2021. The change in variant classification was made as a result of re-review of the evidence in light of new variant interpretation guidelines and/or new information.  ? ?The STAT Breast Rodriguez panel offered by [facility] includes sequencing and rearrangement analysis for the following 9 genes:  ATM, BRCA1, BRCA2, CDH1, CHEK2, PALB2, PTEN, STK11 and TP53.  The Common Hereditary Cancers Panel offered by [facility] includes sequencing and/or deletion duplication testing of the following 48 genes: APC, ATM, AXIN2, BARD1, BMPR1A, BRCA1, BRCA2, BRIP1, CDH1, CDK4, CDKN2A (p14ARF), CDKN2A (p16INK4a), CHEK2, CTNNA1, DICER1, EPCAM (Deletion/duplication testing only), GREM1 (promoter region deletion/duplication testing only), KIT, MEN1, MLH1, MSH2, MSH3, MSH6, MUTYH, NBN, NF1, NTHL1, PALB2, PDGFRA, PMS2, POLD1, POLE, PTEN, RAD50, RAD51C, RAD51D, RNF43, SDHB, SDHC, SDHD, SMAD4, SMARCA4. STK11, TP53, TSC1, TSC2, and VHL.  The following genes were evaluated for sequence changes only: SDHA and HOXB13 c.251G>A variant only.  ?  ?09/01/2020 - 01/11/2021 Neo-Adjuvant Chemotherapy  ? Adriamycin and Cytoxan x4 09/01/2020-10/12/2020 ?Weekly Taxol x 12  10/26/2020-01/11/2021(dose reduced d/t AE) ?  ?02/07/2021 Surgery  ? Right lumpectomy Melody Rodriguez): invasive and in situ ductal carcinoma, 2.8cm, clear margins, with metastatic carcinoma in 1/1 right axillary lymph nodes. ?  ?03/28/2021 Surgery  ? Axillary lymph node dissection: 3/7 lymph nodes + ?  ?05/10/2021 - 07/01/2021 Radiation Therapy  ? Site Technique Total Dose (Gy) Dose per Fx (Gy) Completed Fx Beam Energies  ?Breast, Right: Breast_Rt 3D 50.4/50.4 1.8 28/28  10X  ?Breast, Right: Breast_Rt_Bst specialPort 12/12 2 6/6 15E  ?Sclav-RT: SCV_Rt 3D 50.4/50.4 1.8 28/28   ? ?  ?07/2021 -   Anti-estrogen oral therapy  ? Zoladex + Letrozole + Verzenio ?  ? ? ?CURRENT THERAPY: ? ?INTERVAL HISTORY: ?Melody Rodriguez 46 y.o. female returns for follow-up based on the following mychart message. ? ? ?Chair tells me that she has been experiencing persistent pain in her right arm.  She did have some breast lymphedema and range of motion difficulty.  She went to PT and says that the swelling and range of motion is improved, but that she reached a plateau.  She and the therapist discussed dry needling and she isn't sure whether she would be a good candidate for this.  She says she has a constant ache and difficulty turning her head from left and right.  Previous xray of neck was negative, and mri of shoulder showed tendinosis that didn't respond to an injection.   ? ?She felt like things got worse with restarting Letrozole along with hot flashes and stiffness.   ? ?Patient Active Problem List  ? Diagnosis Date Noted  ? Pap smear for cervical Rodriguez screening 08/19/2021  ? Bartholin cyst 05/06/2021  ? Amenorrhea, secondary 03/02/2021  ? PICC (peripherally inserted central catheter) in place 09/09/2020  ? Genetic testing 08/11/2020  ? Iron deficiency anemia due to chronic blood loss 08/04/2020  ? Family history of colon Rodriguez   ? Malignant neoplasm of upper-outer quadrant of right breast in female, estrogen receptor positive (Melody Rodriguez) 07/28/2020  ? ? ?is allergic to cephalexin, latex, and naproxen. ? ?MEDICAL HISTORY: ?Past Medical History:  ?Diagnosis Date  ? Breast Rodriguez ([place])   ? Diabetes mellitus without complication ([place])   ? Family history of colon Rodriguez   ? History of radiation therapy   ? right breast/scv  05/10/21-07/01/21  Dr Gery Pray  ? ? ?SURGICAL HISTORY: ?Past Surgical History:  ?Procedure Laterality Date  ? BREAST BIOPSY Right 02/18/2021  ? Procedure: EVACUATION HEMATOMA RIGHT AXILLA;  Surgeon: Melody Keens, MD;  Location: [place];  Service: General;  Laterality: Right;  ? BREAST  CYST EXCISION Right   ? Patient does not recall (2014 or 2015)  ? BREAST LUMPECTOMY WITH RADIOACTIVE SEED AND SENTINEL LYMPH NODE BIOPSY Right 02/07/2021  ? Procedure: RIGHT BREAST LUMPECTOMY WITH RADIOACTIVE SEED AND SEED TARGETED LYMPH NODE BIOPSY AND SENTINEL LYMPH NODE BIOPSY;  Surgeon: Melody Keens, MD;  Location: [place];  Service: General;  Laterality: Right;  ? CESAREAN SECTION    ? IR IMAGING GUIDED PORT INSERTION  09/15/2020  ? IR REMOVAL TUN CV CATH W/O FL  09/15/2020  ? NODE DISSECTION Right 03/28/2021  ? Procedure: RIGHT AXILLARY LYMPH NODE DISSECTION;  Surgeon: Melody Keens, MD;  Location: [place];  Service: General;  Laterality: Right;  ? PORT-A-CATH REMOVAL Left 02/07/2021  ? Procedure: REMOVAL PORT-A-CATH;  Surgeon: Melody Keens, MD;  Location: [place];  Service: General;  Laterality: Left;  ? THYROIDECTOMY, PARTIAL    ? ? ?SOCIAL HISTORY: ?Social History  ? ?Socioeconomic History  ? Marital status: Single  ?  Spouse name: Not on file  ? Number of children: 2  ? Years of education: Not on file  ? Highest education level: Some college, no degree  ?Occupational History  ? Not on file  ?Tobacco Use  ? Smoking status: Former  ?  Packs/day: 1.00  ?  Years: 22.00  ?  Pack years: 22.00  ?  Types: Cigars, Cigarettes  ? Smokeless tobacco: Never  ? Tobacco comments:  ?  quit 07/2020  ?Vaping Use  ? Vaping Use: Never used  ?Substance and Sexual Activity  ? Alcohol use: Yes  ?  Comment: occas  ? Drug use: Yes  ?  Types: Marijuana  ?  Comment: chemo/ pain mgmt  ? Sexual activity: Not Currently  ?Other Topics Concern  ? Not on file  ?Social History Narrative  ? Not on file  ? ?Social Determinants of Health  ? ?Financial Resource Strain: High Risk  ? Difficulty of Paying Living Expenses: Hard  ?Food Insecurity: Food Insecurity Present  ? Worried About Charity fundraiser in the Last Year: Sometimes true  ? Ran Out of Food in the Last Year: Sometimes  true  ?Transportation Needs: No Transportation Needs  ? Lack of Transportation (Medical): No  ? Lack of Transportation (Non-Medical): No  ?Physical Activity: Inactive  ? Days of Exercise per Week: 0 days

## 2021-12-08 NOTE — Assessment & Plan Note (Signed)
I saw Melody Rodriguez today for her shoulder pain which is a chronic issue related to her breast cancer.  She continues on letrozole and Zoladex.  Due to her persistent shoulder pain she has undergone evaluation by Ortho who has nothing else to offer her and she does not require any surgery that would help.  She has undergone physical therapy for few weeks which has helped some but has not alleviated this constant pain.  Therefore we are going to do the following.  She will stop taking letrozole and we will follow-up with her in 2 weeks to see how she is doing.  In the meantime I have reached out to physical therapy to see if she would be a candidate for dry needling.  In 2 weeks if the pain is better we will change her antiestrogen therapy.  If it is not better we will refer her to a pain specialist to help [place] with this shoulder pain. ?

## 2021-12-09 ENCOUNTER — Other Ambulatory Visit (HOSPITAL_COMMUNITY): Payer: Self-pay

## 2021-12-09 MED ORDER — GLIPIZIDE 10 MG PO TABS
ORAL_TABLET | ORAL | 2 refills | Status: DC
  Filled 2021-12-09: qty 60, 30d supply, fill #0
  Filled 2022-01-15: qty 60, 30d supply, fill #1
  Filled 2022-02-16: qty 60, 30d supply, fill #2

## 2021-12-14 ENCOUNTER — Other Ambulatory Visit: Payer: Self-pay

## 2021-12-14 ENCOUNTER — Encounter: Payer: Self-pay | Admitting: Rehabilitative and Restorative Service Providers"

## 2021-12-14 ENCOUNTER — Ambulatory Visit: Payer: Medicaid Other | Admitting: Rehabilitative and Restorative Service Providers"

## 2021-12-14 DIAGNOSIS — Z170 Estrogen receptor positive status [ER+]: Secondary | ICD-10-CM

## 2021-12-14 DIAGNOSIS — R293 Abnormal posture: Secondary | ICD-10-CM

## 2021-12-14 DIAGNOSIS — M25611 Stiffness of right shoulder, not elsewhere classified: Secondary | ICD-10-CM

## 2021-12-14 DIAGNOSIS — L599 Disorder of the skin and subcutaneous tissue related to radiation, unspecified: Secondary | ICD-10-CM

## 2021-12-14 DIAGNOSIS — M25511 Pain in right shoulder: Secondary | ICD-10-CM

## 2021-12-14 DIAGNOSIS — I890 Lymphedema, not elsewhere classified: Secondary | ICD-10-CM | POA: Diagnosis not present

## 2021-12-14 NOTE — Patient Instructions (Signed)

## 2021-12-14 NOTE — Therapy (Signed)
?OUTPATIENT PHYSICAL THERAPY SHOULDER EVALUATION ? ? ?Patient Name: Melody Rodriguez ?MRN: [id number] ?DOB:30-Jan-1976, 46 y.o., female ?Today's Date: 12/14/2021 ? ? PT End of Session - 12/14/21 1017   ? ? Visit Number 1   ? Date for PT Re-Evaluation 02/10/22   ? Authorization Type [employer] - needs auth after 20 visits   ? Authorization - Visit Number 1   ? Authorization - Number of Visits 20   ? PT Start Time 1015   ? PT Stop Time 1055   ? PT Time Calculation (min) 40 min   ? Activity Tolerance Patient tolerated treatment well;Patient limited by pain   ? Behavior During Therapy [facility] for tasks assessed/performed   ? ?  ?  ? ?  ? ? ?Past Medical History:  ?Diagnosis Date  ? Breast cancer ([place])   ? Diabetes mellitus without complication ([place])   ? Family history of colon cancer   ? History of radiation therapy   ? right breast/scv  05/10/21-07/01/21  Dr Gery Pray  ? ?Past Surgical History:  ?Procedure Laterality Date  ? BREAST BIOPSY Right 02/18/2021  ? Procedure: EVACUATION HEMATOMA RIGHT AXILLA;  Surgeon: [name], MD;  Location: [place];  Service: General;  Laterality: Right;  ? BREAST CYST EXCISION Right   ? Patient does not recall (2014 or 2015)  ? BREAST LUMPECTOMY WITH RADIOACTIVE SEED AND SENTINEL LYMPH NODE BIOPSY Right 02/07/2021  ? Procedure: RIGHT BREAST LUMPECTOMY WITH RADIOACTIVE SEED AND SEED TARGETED LYMPH NODE BIOPSY AND SENTINEL LYMPH NODE BIOPSY;  Surgeon: [name], MD;  Location: [place];  Service: General;  Laterality: Right;  ? CESAREAN SECTION    ? IR IMAGING GUIDED PORT INSERTION  09/15/2020  ? IR REMOVAL TUN CV CATH W/O FL  09/15/2020  ? NODE DISSECTION Right 03/28/2021  ? Procedure: RIGHT AXILLARY LYMPH NODE DISSECTION;  Surgeon: [name], MD;  Location: [place];  Service: General;  Laterality: Right;  ? PORT-A-CATH REMOVAL Left 02/07/2021  ? Procedure: REMOVAL PORT-A-CATH;  Surgeon: [name], MD;   Location: [place];  Service: General;  Laterality: Left;  ? THYROIDECTOMY, PARTIAL    ? ?Patient Active Problem List  ? Diagnosis Date Noted  ? Pap smear for cervical cancer screening 08/19/2021  ? Bartholin cyst 05/06/2021  ? Amenorrhea, secondary 03/02/2021  ? PICC (peripherally inserted central catheter) in place 09/09/2020  ? Genetic testing 08/11/2020  ? Iron deficiency anemia due to chronic blood loss 08/04/2020  ? Family history of colon cancer   ? Malignant neoplasm of upper-outer quadrant of right breast in female, estrogen receptor positive ([place]) 07/28/2020  ? ? ?PCP: Patient, [name] ? ?REFERRING PROVIDER: Deanne Coffer* ? ?REFERRING DIAG: M79.601 (ICD-10-CM) - Pain of right upper extremity  ? ?THERAPY DIAG:  ?Stiffness of right shoulder, not elsewhere classified ? ?Acute pain of right shoulder ? ?Abnormal posture ? ?Malignant neoplasm of upper-outer quadrant of right breast in female, estrogen receptor positive ([place]) ? ?Disorder of the skin and subcutaneous tissue related to radiation, unspecified ? ?Lymphedema, not elsewhere classified ? ? ?ONSET DATE: 02/07/2021 was first breast surgery, then had 2 subsequent surgeries ? ?SUBJECTIVE:                                                                                                                                                                                     ? ?  SUBJECTIVE STATEMENT: ?Pt states that she was working with PT and felt that she got some better, but that she is still having problems with her shoulder.  Pt states that the MD referred her back to PT for continued PT and to try dry needling treatment. ? ?PERTINENT HISTORY: ?Patient was diagnosed on 07/13/2020 with right grade 2 invasive ductal carcinoma breast cancer. It is ER/PR positive and HER2 negative with a Ki67 of 20%. Patient underwent neoadjuvant chemotherapy from 09/01/2020 - 01/14/2021. She had a right lumpectomy and sentinel node biopsy (1/1  nodes positive) on 02/07/2021. Hematoma evacuation on 02/18/2021. Axillary dissection on 03/28/2021 with 3/7 positive nodes. Iron deficiency. Radiation  05/10/2021-06/2021  Diagnosis of Type 2 diabetes in Nov. 2022.  Pt has had 2 steroid shots in right shoulder with only about 2 hours of relief.  She was advised not to have any more steroid shots due to her Diabetes  ? ?PAIN:  ?Are you having pain? Yes: NPRS scale: 7/10 ?Pain location: R shoulder ?Pain description: throbbing and sharp ?Aggravating factors: movement ?Relieving factors: unknown at this time ? ?PRECAUTIONS: None ? ?WEIGHT BEARING RESTRICTIONS No ? ?FALLS:  ?Has patient fallen in last 6 months? No ? ?LIVING ENVIRONMENT: ?Lives with: lives with their son, 81 years old. ?Lives in: House/apartment ?Stairs: Yes: External: 2 steps; bilateral but cannot reach both ?Has following equipment at home: None ? 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 ?PLOF: Independent and Leisure: traveling ? ?PATIENT GOALS:  To have better use of right arm. ? ?OBJECTIVE:  ? ?DIAGNOSTIC FINDINGS:  ?09/23/2021 breast ultrasound:  Overall findings to the right breast including skin thickening as ?well as parenchymal and trabecular thickening are favored to ?represent post lumpectomy and radiation changes. ? ?PATIENT SURVEYS:  ?FOTO 42% at initial evaluation (projected 48% by visit 11) ? ?COGNITION: ? Overall cognitive status: Within functional limits for tasks assessed ?    ?SENSATION: ?Light touch: Impaired  on right side ? ?POSTURE: ?Rounded shoulders, forward head ? ?UPPER EXTREMITY ROM:  ? ?Active ROM Right ?12/14/2021 Left ?12/14/2021  ?Shoulder flexion 82 145  ?Shoulder extension 22 65  ?Shoulder abduction 83 140  ?Shoulder adduction    ?Shoulder internal rotation    ?Shoulder external rotation    ?Elbow flexion    ?Elbow extension    ?Wrist flexion    ?Wrist extension    ?Wrist ulnar deviation    ?Wrist radial deviation    ?Wrist pronation    ?Wrist  supination    ?(Blank rows = not tested) ? ?UPPER EXTREMITY MMT: ? ?MMT Right ?12/14/2021 Left ?12/14/2021  ?Shoulder flexion 2 5  ?Shoulder extension 2 5  ?Shoulder abduction 2 5  ?Shoulder adduction    ?Shoulder internal rotation    ?Shoulder external rotation    ?Middle trapezius    ?Lower trapezius    ?Elbow flexion 3 5  ?Elbow extension 3 5  ?Wrist flexion    ?Wrist extension    ?Wrist ulnar deviation    ?Wrist radial deviation    ?Wrist pronation    ?Wrist supination    ?Grip strength (lbs) 19 60  ?(Blank rows = not tested) ? ?SHOULDER SPECIAL TESTS: ? Impingement tests: Hawkins/Kennedy impingement test: positive on right ? Rotator cuff assessment: Empty can test: positive on right ?  ?PALPATION:  ?Edema noted with sleeve in place.  Tender to palpation over shoulder region. ?  ?TODAY'S TREATMENT:  ?12/14/2021: ?Wall wash R shoulder flexion and scaption x10 each ?  Fwd ball roll out with blue pball x10 with 5 sec hold ? ? ?PATIENT EDUCATION: ?Education details: HEP and education on Dry Needling ?Person educated: Patient ?Education method: Explanation and Handouts ?Education comprehension: verbalized understanding ? ? ?HOME EXERCISE PROGRAM: ?Access Code: [email or web address] ?URL: [email or web address] ?Date: 12/14/2021 ?Prepared by: Juel Burrow ? ?Exercises ?- Supine Shoulder Protraction with Dowel  - 2 x daily - 7 x weekly - 1 sets - 10 reps ?- Supine Shoulder Horizontal Abduction Adduction AAROM with Dowel  - 2 x daily - 7 x weekly - 1 sets - 10 reps ?- Supine Shoulder Circles with Dowel Clockwise  - 2 x daily - 7 x weekly - 1 sets - 10 reps ?- Supine Shoulder Flexion Extension AAROM with Dowel  - 1 x daily - 7 x weekly - 1 sets - 10 reps - 5 hold ?- Shoulder Flexion Wall Slide with Towel  - 1 x daily - 7 x weekly - 2 sets - 10 reps ? ?ASSESSMENT: ? ?CLINICAL IMPRESSION: ?Patient is a 46 y.o. female who was seen today for physical therapy evaluation and treatment for R arm pain. Pt had been working with  Cancer PT following her surgery and her resulting lymphedema, but discharged due to meeting max potential at that time.  Pt ordered PT on ortho side secondary to MD wanting patient to try dry needling to as

## 2021-12-21 ENCOUNTER — Encounter: Payer: Medicaid Other | Admitting: Rehabilitative and Restorative Service Providers"

## 2021-12-21 ENCOUNTER — Encounter (HOSPITAL_COMMUNITY): Payer: Self-pay

## 2021-12-22 ENCOUNTER — Inpatient Hospital Stay: Payer: Medicaid Other | Attending: Hematology and Oncology

## 2021-12-22 ENCOUNTER — Other Ambulatory Visit: Payer: Self-pay

## 2021-12-22 VITALS — BP 134/80 | HR 86 | Temp 99.1°F | Resp 18

## 2021-12-22 DIAGNOSIS — Z170 Estrogen receptor positive status [ER+]: Secondary | ICD-10-CM | POA: Insufficient documentation

## 2021-12-22 DIAGNOSIS — Z87891 Personal history of nicotine dependence: Secondary | ICD-10-CM | POA: Diagnosis not present

## 2021-12-22 DIAGNOSIS — Z79899 Other long term (current) drug therapy: Secondary | ICD-10-CM | POA: Diagnosis not present

## 2021-12-22 DIAGNOSIS — C773 Secondary and unspecified malignant neoplasm of axilla and upper limb lymph nodes: Secondary | ICD-10-CM | POA: Diagnosis present

## 2021-12-22 DIAGNOSIS — Z5111 Encounter for antineoplastic chemotherapy: Secondary | ICD-10-CM | POA: Insufficient documentation

## 2021-12-22 DIAGNOSIS — Z923 Personal history of irradiation: Secondary | ICD-10-CM | POA: Diagnosis not present

## 2021-12-22 DIAGNOSIS — Z9221 Personal history of antineoplastic chemotherapy: Secondary | ICD-10-CM | POA: Insufficient documentation

## 2021-12-22 DIAGNOSIS — C50411 Malignant neoplasm of upper-outer quadrant of right female breast: Secondary | ICD-10-CM | POA: Insufficient documentation

## 2021-12-22 MED ORDER — GOSERELIN ACETATE 3.6 MG ~~LOC~~ IMPL
3.6000 mg | DRUG_IMPLANT | Freq: Once | SUBCUTANEOUS | Status: AC
  Administered 2021-12-22: 3.6 mg via SUBCUTANEOUS
  Filled 2021-12-22: qty 3.6

## 2022-01-02 ENCOUNTER — Telehealth: Payer: Self-pay | Admitting: Adult Health

## 2022-01-02 ENCOUNTER — Telehealth: Payer: Self-pay | Admitting: Hematology and Oncology

## 2022-01-02 NOTE — Telephone Encounter (Signed)
Per 4/17 in basket called pt and left message about appointment.  Call back number left if changes are needed ?

## 2022-01-02 NOTE — Telephone Encounter (Signed)
Called pt to schedule 4/17 inbasket, left msg requesting pt call back ?

## 2022-01-04 ENCOUNTER — Encounter: Payer: Self-pay | Admitting: Rehabilitative and Restorative Service Providers"

## 2022-01-04 ENCOUNTER — Ambulatory Visit: Payer: Medicaid Other | Attending: Adult Health | Admitting: Rehabilitative and Restorative Service Providers"

## 2022-01-04 DIAGNOSIS — M25511 Pain in right shoulder: Secondary | ICD-10-CM | POA: Insufficient documentation

## 2022-01-04 DIAGNOSIS — R293 Abnormal posture: Secondary | ICD-10-CM | POA: Insufficient documentation

## 2022-01-04 DIAGNOSIS — M25611 Stiffness of right shoulder, not elsewhere classified: Secondary | ICD-10-CM | POA: Insufficient documentation

## 2022-01-04 DIAGNOSIS — Z170 Estrogen receptor positive status [ER+]: Secondary | ICD-10-CM | POA: Insufficient documentation

## 2022-01-04 DIAGNOSIS — C50411 Malignant neoplasm of upper-outer quadrant of right female breast: Secondary | ICD-10-CM | POA: Insufficient documentation

## 2022-01-04 NOTE — Therapy (Signed)
?OUTPATIENT PHYSICAL THERAPY TREATMENT NOTE ? ? ?Patient Name: Melody Rodriguez ?MRN: [id number] ?DOB:1975/11/26, 47 y.o., female ?Today's Date: 01/04/2022 ? ?PCP: Patient, [name] ?REFERRING PROVIDER: Wilber Bihari Cornett* ? ?END OF SESSION:  ? PT End of Session - 01/04/22 0941   ? ? Visit Number 2   ? Date for PT Re-Evaluation 02/10/22   ? Authorization Type [employer] - needs auth after 20 visits   ? Authorization - Visit Number 2   ? Authorization - Number of Visits 20   ? PT Start Time 0930   ? PT Stop Time 1010   ? PT Time Calculation (min) 40 min   ? ?  ?  ? ?  ? ? ?Past Medical History:  ?Diagnosis Date  ? Breast cancer ([place])   ? Diabetes mellitus without complication ([place])   ? Family history of colon cancer   ? History of radiation therapy   ? right breast/scv  05/10/21-07/01/21  Dr Gery Pray  ? ?Past Surgical History:  ?Procedure Laterality Date  ? BREAST BIOPSY Right 02/18/2021  ? Procedure: EVACUATION HEMATOMA RIGHT AXILLA;  Surgeon: [name], MD;  Location: [place];  Service: General;  Laterality: Right;  ? BREAST CYST EXCISION Right   ? Patient does not recall (2014 or 2015)  ? BREAST LUMPECTOMY WITH RADIOACTIVE SEED AND SENTINEL LYMPH NODE BIOPSY Right 02/07/2021  ? Procedure: RIGHT BREAST LUMPECTOMY WITH RADIOACTIVE SEED AND SEED TARGETED LYMPH NODE BIOPSY AND SENTINEL LYMPH NODE BIOPSY;  Surgeon: [name], MD;  Location: [place];  Service: General;  Laterality: Right;  ? CESAREAN SECTION    ? IR IMAGING GUIDED PORT INSERTION  09/15/2020  ? IR REMOVAL TUN CV CATH W/O FL  09/15/2020  ? NODE DISSECTION Right 03/28/2021  ? Procedure: RIGHT AXILLARY LYMPH NODE DISSECTION;  Surgeon: [name], MD;  Location: [place];  Service: General;  Laterality: Right;  ? PORT-A-CATH REMOVAL Left 02/07/2021  ? Procedure: REMOVAL PORT-A-CATH;  Surgeon: [name], MD;  Location: [place];  Service:  General;  Laterality: Left;  ? THYROIDECTOMY, PARTIAL    ? ?Patient Active Problem List  ? Diagnosis Date Noted  ? Pap smear for cervical cancer screening 08/19/2021  ? Bartholin cyst 05/06/2021  ? Amenorrhea, secondary 03/02/2021  ? PICC (peripherally inserted central catheter) in place 09/09/2020  ? Genetic testing 08/11/2020  ? Iron deficiency anemia due to chronic blood loss 08/04/2020  ? Family history of colon cancer   ? Malignant neoplasm of upper-outer quadrant of right breast in female, estrogen receptor positive ([place]) 07/28/2020  ? ? ?REFERRING DIAG: M79.601 (ICD-10-CM) - Pain of right upper extremity  ? ?THERAPY DIAG:  ?Stiffness of right shoulder, not elsewhere classified ? ?Acute pain of right shoulder ? ?Abnormal posture ? ?Malignant neoplasm of upper-outer quadrant of right breast in female, estrogen receptor positive ([place]) ? ?PERTINENT HISTORY: Patient was diagnosed on 07/13/2020 with right grade 2 invasive ductal carcinoma breast cancer. It is ER/PR positive and HER2 negative with a Ki67 of 20%. Patient underwent neoadjuvant chemotherapy from 09/01/2020 - 01/14/2021. She had a right lumpectomy and sentinel node biopsy (1/1 nodes positive) on 02/07/2021. Hematoma evacuation on 02/18/2021. Axillary dissection on 03/28/2021 with 3/7 positive nodes. Iron deficiency. Radiation  05/10/2021-06/2021  Diagnosis of Type 2 diabetes in Nov. 2022.  Pt has had 2 steroid shots in right shoulder with only about 2 hours of relief.  She was advised not to have  any more steroid shots due to her Diabetes  ? ?PRECAUTIONS: None ? ?SUBJECTIVE: Pt reports that she has been trying to do her exercises since initial evaluation.  "Today is the loosest that it has been". ? ?PAIN:  ?Are you having pain? Yes: NPRS scale: 7/10 ?Pain location: R shoulder ?Pain description: burning ?Aggravating factors: movement ?Relieving factors: unknown at this time ? ?PATIENT GOALS:  To have better use of right arm. ? ? ?OBJECTIVE:  ?  ?DIAGNOSTIC  FINDINGS:  ?09/23/2021 breast ultrasound:  Overall findings to the right breast including skin thickening as ?well as parenchymal and trabecular thickening are favored to ?represent post lumpectomy and radiation changes. ?  ?PATIENT SURVEYS:  ?FOTO 42% at initial evaluation (projected 48% by visit 11) ?  ?COGNITION: ?          Overall cognitive status: Within functional limits for tasks assessed ?                                 ?SENSATION: ?Light touch: Impaired  on right side ?  ?POSTURE: ?Rounded shoulders, forward head ?  ?UPPER EXTREMITY ROM:  ?  ?Active ROM Right ?12/14/2021 Left ?12/14/2021  ?Shoulder flexion 82 145  ?Shoulder extension 22 65  ?Shoulder abduction 83 140  ?Shoulder adduction      ?Shoulder internal rotation      ?Shoulder external rotation      ?Elbow flexion      ?Elbow extension      ?Wrist flexion      ?Wrist extension      ?Wrist ulnar deviation      ?Wrist radial deviation      ?Wrist pronation      ?Wrist supination      ?(Blank rows = not tested) ?  ?UPPER EXTREMITY MMT: ?  ?MMT Right ?12/14/2021 Left ?12/14/2021  ?Shoulder flexion 2 5  ?Shoulder extension 2 5  ?Shoulder abduction 2 5  ?Shoulder adduction      ?Shoulder internal rotation      ?Shoulder external rotation      ?Middle trapezius      ?Lower trapezius      ?Elbow flexion 3 5  ?Elbow extension 3 5  ?Wrist flexion      ?Wrist extension      ?Wrist ulnar deviation      ?Wrist radial deviation      ?Wrist pronation      ?Wrist supination      ?Grip strength (lbs) 19 60  ?(Blank rows = not tested) ?  ?SHOULDER SPECIAL TESTS: ?           Impingement tests: Hawkins/Kennedy impingement test: positive on right ?           Rotator cuff assessment: Empty can test: positive on right ?            ?PALPATION:  ?Edema noted with sleeve in place.  Tender to palpation over shoulder region. ?            ?TODAY'S TREATMENT:  ? ?01/04/2022: ?Pulleys flexion and abduction x3 min each ?Trigger Point Dry-Needling  ?Treatment instructions: Expect mild to  moderate muscle soreness. S/S of pneumothorax if dry needled over a lung field, and to seek immediate medical attention should they occur. Patient verbalized understanding of these instructions and education. ? ?Patient Consent Given: Yes ?Education handout provided: Previously provided ?Muscles treated: anterior and middle delt ?Treatment response/outcome: twitch response  illicited ?Manual therapy of soft tissue mobilization for muscle elongation to R shoulder and cervical.  Suction cup with gentle pressure for tissue gliding/mobilization. ?PROM to R shoulder in all planes of motion ? ?12/14/2021: ?Wall wash R shoulder flexion and scaption x10 each ?Fwd ball roll out with blue pball x10 with 5 sec hold ?  ?  ?PATIENT EDUCATION: ?Education details: HEP and education on Dry Needling ?Person educated: Patient ?Education method: Explanation and Handouts ?Education comprehension: verbalized understanding ?  ?  ?HOME EXERCISE PROGRAM: ?Access Code: [email or web address] ?URL: [email or web address] ?Date: 12/14/2021 ?Prepared by: Juel Burrow ?  ?Exercises ?- Supine Shoulder Protraction with Dowel  - 2 x daily - 7 x weekly - 1 sets - 10 reps ?- Supine Shoulder Horizontal Abduction Adduction AAROM with Dowel  - 2 x daily - 7 x weekly - 1 sets - 10 reps ?- Supine Shoulder Circles with Dowel Clockwise  - 2 x daily - 7 x weekly - 1 sets - 10 reps ?- Supine Shoulder Flexion Extension AAROM with Dowel  - 1 x daily - 7 x weekly - 1 sets - 10 reps - 5 hold ?- Shoulder Flexion Wall Slide with Towel  - 1 x daily - 7 x weekly - 2 sets - 10 reps ?  ?ASSESSMENT: ?  ?CLINICAL IMPRESSION: ?Patient presents to therapy agreeable to participate and wants to dry dry needling.  Pt with muscle spasms and trigger points noted with palpation to right deltoids, upper trap, and suboccipitals.  Pt with pain noted with dry needling, but muscle elongation noted with more supple musculature.  Pt only wished for DN to anterior and middle delt, then  progressed to manual therapy and soft tissue mobilization per pt request and comfort.  Pt continues to require skilled therapy to progress towards goal related activities ?  ?  ?OBJECTIVE IMPAIRMENTS de

## 2022-01-09 ENCOUNTER — Encounter (HOSPITAL_COMMUNITY): Payer: Self-pay | Admitting: Emergency Medicine

## 2022-01-09 ENCOUNTER — Telehealth (HOSPITAL_COMMUNITY): Payer: Self-pay | Admitting: Internal Medicine

## 2022-01-09 ENCOUNTER — Ambulatory Visit (HOSPITAL_COMMUNITY)
Admission: EM | Admit: 2022-01-09 | Discharge: 2022-01-09 | Disposition: A | Discharge status: Home / Self Care | Payer: Medicaid Other | Attending: Internal Medicine | Admitting: Internal Medicine

## 2022-01-09 DIAGNOSIS — K047 Periapical abscess without sinus: Secondary | ICD-10-CM

## 2022-01-09 MED ORDER — CLINDAMYCIN HCL 300 MG PO CAPS: 300.0000 mg | ORAL_CAPSULE | Freq: Three times a day (TID) | ORAL | 0 refills | Status: AC

## 2022-01-09 MED ORDER — TRAMADOL HCL 50 MG PO TABS: 50.0000 mg | ORAL_TABLET | Freq: Two times a day (BID) | ORAL | 0 refills | Status: DC | PRN

## 2022-01-09 MED ORDER — HYDROCODONE-ACETAMINOPHEN 5-325 MG PO TABS: 1.0000 | ORAL_TABLET | Freq: Four times a day (QID) | ORAL | 0 refills | Status: DC | PRN

## 2022-01-09 NOTE — Discharge Instructions (Addendum)
Warm salt water mouth rinse ?Take antibiotics as prescribed ?Take pain medications as prescribed ?Please find a dentist to evaluate you for dental care ?Return to urgent care if you have any other concerns. ?

## 2022-01-09 NOTE — ED Triage Notes (Signed)
Pt is present today with a dental abscess located on the left side of her mouth. Pt states that she noticed it Saturday  ?

## 2022-01-10 NOTE — ED Provider Notes (Signed)
?[facility] ? ? ? ?CSN: [id number] ?Arrival date & time: 01/09/22  1643 ? ? ?  ? ?History   ?Chief Complaint ?Chief Complaint  ?Patient presents with  ? dental abcess  ? ? ?HPI ?Melody Rodriguez is a 46 y.o. female comes to the urgent care with 2-day history of left-sided jaw pain and swelling of the left throat.  Pain is throbbing currently 7 out of 10.  Patient denies any fever or chills.  She endorses some discharge from the gum.  No known relieving factors.  No pain on swallowing.  She has tried hydration peroxide mouth rinses with no significant improvement.  Patient has poor dentition but she does not have a dentist, has not sought dental care for a long time.  ? ?HPI ? ?Past Medical History:  ?Diagnosis Date  ? Breast cancer ([place])   ? Diabetes mellitus without complication ([place])   ? Family history of colon cancer   ? History of radiation therapy   ? right breast/scv  05/10/21-07/01/21  Dr Gery Pray  ? ? ?Patient Active Problem List  ? Diagnosis Date Noted  ? Pap smear for cervical cancer screening 08/19/2021  ? Bartholin cyst 05/06/2021  ? Amenorrhea, secondary 03/02/2021  ? PICC (peripherally inserted central catheter) in place 09/09/2020  ? Genetic testing 08/11/2020  ? Iron deficiency anemia due to chronic blood loss 08/04/2020  ? Family history of colon cancer   ? Malignant neoplasm of upper-outer quadrant of right breast in female, estrogen receptor positive ([place]) 07/28/2020  ? ? ?Past Surgical History:  ?Procedure Laterality Date  ? BREAST BIOPSY Right 02/18/2021  ? Procedure: EVACUATION HEMATOMA RIGHT AXILLA;  Surgeon: [name], MD;  Location: [place];  Service: General;  Laterality: Right;  ? BREAST CYST EXCISION Right   ? Patient does not recall (2014 or 2015)  ? BREAST LUMPECTOMY WITH RADIOACTIVE SEED AND SENTINEL LYMPH NODE BIOPSY Right 02/07/2021  ? Procedure: RIGHT BREAST LUMPECTOMY WITH RADIOACTIVE SEED AND SEED TARGETED LYMPH NODE BIOPSY AND SENTINEL LYMPH NODE  BIOPSY;  Surgeon: [name], MD;  Location: [place];  Service: General;  Laterality: Right;  ? CESAREAN SECTION    ? IR IMAGING GUIDED PORT INSERTION  09/15/2020  ? IR REMOVAL TUN CV CATH W/O FL  09/15/2020  ? NODE DISSECTION Right 03/28/2021  ? Procedure: RIGHT AXILLARY LYMPH NODE DISSECTION;  Surgeon: [name], MD;  Location: [place];  Service: General;  Laterality: Right;  ? PORT-A-CATH REMOVAL Left 02/07/2021  ? Procedure: REMOVAL PORT-A-CATH;  Surgeon: [name], MD;  Location: [place];  Service: General;  Laterality: Left;  ? THYROIDECTOMY, PARTIAL    ? ? ?OB History   ? ? Gravida  ?2  ? Para  ?   ? Term  ?   ? Preterm  ?   ? AB  ?   ? Living  ?2  ?  ? ? SAB  ?   ? IAB  ?   ? Ectopic  ?   ? Multiple  ?   ? Live Births  ?2  ?   ?  ?  ? ? ? ?Home Medications   ? ?Prior to Admission medications   ?Medication Sig Start Date End Date Taking? Authorizing Provider  ?clindamycin (CLEOCIN) 300 MG capsule Take 1 capsule (300 mg total) by mouth 3 (three) times daily for 7 days. 01/09/22 01/16/22 Yes Trayonna Bachmeier, Myrene Galas, MD  ?traMADol (ULTRAM) 50 MG  tablet Take 1 tablet (50 mg total) by mouth every 12 (twelve) hours as needed. 01/09/22  Yes Madelein Mahadeo, Myrene Galas, MD  ?ACCU-CHEK GUIDE test strip USE TO MONITOR BLOOD GLUCOSE 3 TIME(S) DAILY 10/11/21   [provider]  ?Accu-Chek Softclix Lancets lancets 3 (three) times daily. 10/22/21   [provider]  ?acetaminophen (TYLENOL) 325 MG tablet Take by mouth.    [provider]  ?atorvastatin (LIPITOR) 10 MG tablet Take one tablet (10 mg dose) by mouth daily. 08/15/21     ?Blood Glucose Monitoring Suppl (ACCU-CHEK GUIDE ME) w/Device KIT See admin instructions. 08/15/21   [provider]  ?fexofenadine (ALLEGRA) 180 MG tablet Take by mouth. 01/13/20   [provider]  ?fluticasone Asencion Islam) 50 MCG/ACT nasal spray  01/13/20   [provider]  ?glipiZIDE (GLUCOTROL)  10 MG tablet Take one tablet (10 mg dose) by mouth 2 (two) times daily. 12/09/21     ?glipiZIDE (GLUCOTROL) 5 MG tablet Take one tablet by mouth 2 (two) times daily. 08/15/21     ?hydrOXYzine (ATARAX) 10 MG tablet Take 1 tablet by mouth 3 (three) times a day as needed for Anxiety. 11/08/21     ?letrozole (FEMARA) 2.5 MG tablet Take 1 tablet (2.5 mg total) by mouth daily. 07/19/21   Nicholas Lose, MD  ?meloxicam (MOBIC) 15 MG tablet Take 1 tablet  by mouth daily as needed for pain. ?Patient not taking: Reported on 11/03/2021 09/27/21   Mcarthur Rossetti, MD  ?metaxalone [facility]) 400 MG tablet Take 1 tablet by mouth daily as needed. ?Patient not taking: Reported on 11/03/2021 09/26/21   Nicholas Lose, MD  ?ondansetron (ZOFRAN-ODT) 8 MG disintegrating tablet Take 1 tablet (8 mg total) by mouth every 8 (eight) hours as needed for nausea or vomiting. 09/04/21   Hazel Sams, PA-C  ?pioglitazone (ACTOS) 15 MG tablet Take one tablet (15 mg dose) by mouth daily. 08/22/21     ?triamcinolone cream (KENALOG) 0.1 % Apply topically 2 (two) times a day as needed. 08/22/21     ? ? ?Family History ?Family History  ?Problem Relation Age of Onset  ? Hypertension Mother   ? Colon cancer Sister 33  ? Diabetes Maternal Grandmother   ? Cancer Maternal Aunt   ?     unknown type dx late 63s  ? ? ?Social History ?Social History  ? ?Tobacco Use  ? Smoking status: Former  ?  Packs/day: 1.00  ?  Years: 22.00  ?  Pack years: 22.00  ?  Types: Cigars, Cigarettes  ? Smokeless tobacco: Never  ? Tobacco comments:  ?  quit 07/2020  ?Vaping Use  ? Vaping Use: Never used  ?Substance Use Topics  ? Alcohol use: Yes  ?  Comment: occas  ? Drug use: Yes  ?  Types: Marijuana  ?  Comment: chemo/ pain mgmt  ? ? ? ?Allergies   ?Cephalexin, Latex, Naproxen, and Tramadol ? ? ?Review of Systems ?Review of Systems ?As per HPI ? ?Physical Exam ?Triage Vital Signs ?ED Triage Vitals hydrocodone-  ?Enc Vitals Group  ?   BP 133/65  ?   Pulse Rate (!) 105  ?   Resp 19   ?   Temp 99.3 ?F (37.4 ?C)  ?   Temp src   ?   SpO2 95 %  ?   Weight   ?   Height   ?   Head Circumference   ?   Peak Flow   ?  Pain Score 7  ?   Pain Loc   ?   Pain Edu?   ?   Excl. in [place]?   ? ?No data found. ? ?Updated Vital Signs ?BP 133/65   Pulse (!) 105   Temp 99.3 ?F (37.4 ?C)   Resp 19   SpO2 95%  ? ?Visual Acuity ?Right Eye Distance:   ?Left Eye Distance:   ?Bilateral Distance:   ? ?Right Eye Near:   ?Left Eye Near:    ?Bilateral Near:    ? ?Physical Exam ?Vitals and nursing note reviewed.  ?Constitutional:   ?   General: She is in acute distress.  ?   Appearance: She is not ill-appearing.  ?HENT:  ?   Right Ear: Tympanic membrane normal.  ?   Left Ear: Tympanic membrane normal.  ?   Ears:  ?   Comments: No tenderness on palpation over the left TMJ.  No clicking of the TMJ ?   Nose: Nose normal.  ?   Mouth/Throat:  ?   Comments: Poor dental hygiene.  Multiple dental cavities. ?Cardiovascular:  ?   Rate and Rhythm: Normal rate and regular rhythm.  ?Neurological:  ?   Mental Status: She is alert.  ? ? ? ?UC Treatments / Results  ?Labs ?(all labs ordered are listed, but only abnormal results are displayed) ?Labs Reviewed - No data to display ? ?EKG ? ? ?Radiology ?No results found. ? ?Procedures ?Procedures (including critical care time) ? ?Medications Ordered in UC ?Medications - No data to display ? ?Initial Impression / Assessment and Plan / UC Course  ?I have reviewed the triage vital signs and the nursing notes. ? ?Pertinent labs & imaging results that were available during my care of the patient were reviewed by me and considered in my medical decision making (see chart for details). ? ?  ? ?1.  Dental abscess: ?Clindamycin 300 mg 3 times daily for 7 days ?Continue chlorhexidine mouth rinses ?Warm salt water mouth rinse ?Hydrocodone-acetaminophen as needed for pain ?Dental evaluation is strongly recommended ?Return precautions given. ?Final Clinical Impressions(s) / UC Diagnoses  ? ?Final  diagnoses:  ?Dental abscess  ? ? ? ?Discharge Instructions   ? ?  ?Warm salt water mouth rinse ?Take antibiotics as prescribed ?Take pain medications as prescribed ?Please find a dentist to evaluate you for denta

## 2022-01-11 ENCOUNTER — Encounter: Payer: Self-pay | Admitting: Rehabilitative and Restorative Service Providers"

## 2022-01-11 ENCOUNTER — Ambulatory Visit: Payer: Medicaid Other | Admitting: Rehabilitative and Restorative Service Providers"

## 2022-01-11 DIAGNOSIS — M25611 Stiffness of right shoulder, not elsewhere classified: Secondary | ICD-10-CM

## 2022-01-11 DIAGNOSIS — M25511 Pain in right shoulder: Secondary | ICD-10-CM

## 2022-01-11 DIAGNOSIS — R293 Abnormal posture: Secondary | ICD-10-CM

## 2022-01-11 NOTE — Therapy (Signed)
?OUTPATIENT PHYSICAL THERAPY TREATMENT NOTE ? ? ?Patient Name: Melody Rodriguez ?MRN: [id number] ?DOB:[birth year], 46 y.o., female ?Today's Date: 01/11/2022 ? ?PCP: Patient, [name] ?REFERRING PROVIDER: Wilber Bihari Cornett* ? ?END OF SESSION:  ? PT End of Session - 01/11/22 0941   ? ? Visit Number 3   ? Date for PT Re-Evaluation 02/10/22   ? Authorization Type [employer] - needs auth after 20 visits   ? Authorization - Visit Number 3   ? Authorization - Number of Visits 20   ? PT Start Time [phone]   ? PT Stop Time 1010   ? PT Time Calculation (min) 39 min   ? Activity Tolerance Patient tolerated treatment well;Patient limited by pain   ? Behavior During Therapy [facility] for tasks assessed/performed   ? ?  ?  ? ?  ? ? ?Past Medical History:  ?Diagnosis Date  ? Breast cancer ([place])   ? Diabetes mellitus without complication ([place])   ? Family history of colon cancer   ? History of radiation therapy   ? right breast/scv  05/10/21-07/01/21  Dr Gery Pray  ? ?Past Surgical History:  ?Procedure Laterality Date  ? BREAST BIOPSY Right 02/18/2021  ? Procedure: EVACUATION HEMATOMA RIGHT AXILLA;  Surgeon: [name], MD;  Location: [place];  Service: General;  Laterality: Right;  ? BREAST CYST EXCISION Right   ? Patient does not recall (2014 or 2015)  ? BREAST LUMPECTOMY WITH RADIOACTIVE SEED AND SENTINEL LYMPH NODE BIOPSY Right 02/07/2021  ? Procedure: RIGHT BREAST LUMPECTOMY WITH RADIOACTIVE SEED AND SEED TARGETED LYMPH NODE BIOPSY AND SENTINEL LYMPH NODE BIOPSY;  Surgeon: [name], MD;  Location: [place];  Service: General;  Laterality: Right;  ? CESAREAN SECTION    ? IR IMAGING GUIDED PORT INSERTION  09/15/2020  ? IR REMOVAL TUN CV CATH W/O FL  09/15/2020  ? NODE DISSECTION Right 03/28/2021  ? Procedure: RIGHT AXILLARY LYMPH NODE DISSECTION;  Surgeon: [name], MD;  Location: [place];  Service: General;  Laterality: Right;  ?  PORT-A-CATH REMOVAL Left 02/07/2021  ? Procedure: REMOVAL PORT-A-CATH;  Surgeon: [name], MD;  Location: [place];  Service: General;  Laterality: Left;  ? THYROIDECTOMY, PARTIAL    ? ?Patient Active Problem List  ? Diagnosis Date Noted  ? Pap smear for cervical cancer screening 08/19/2021  ? Bartholin cyst 05/06/2021  ? Amenorrhea, secondary 03/02/2021  ? PICC (peripherally inserted central catheter) in place 09/09/2020  ? Genetic testing 08/11/2020  ? Iron deficiency anemia due to chronic blood loss 08/04/2020  ? Family history of colon cancer   ? Malignant neoplasm of upper-outer quadrant of right breast in female, estrogen receptor positive ([place]) 07/28/2020  ? ? ?REFERRING DIAG: M79.601 (ICD-10-CM) - Pain of right upper extremity  ? ?THERAPY DIAG:  ?Stiffness of right shoulder, not elsewhere classified ? ?Acute pain of right shoulder ? ?Abnormal posture ? ?PERTINENT HISTORY: Patient was diagnosed on 07/13/2020 with right grade 2 invasive ductal carcinoma breast cancer. It is ER/PR positive and HER2 negative with a Ki67 of 20%. Patient underwent neoadjuvant chemotherapy from 09/01/2020 - 01/14/2021. She had a right lumpectomy and sentinel node biopsy (1/1 nodes positive) on 02/07/2021. Hematoma evacuation on 02/18/2021. Axillary dissection on 03/28/2021 with 3/7 positive nodes. Iron deficiency. Radiation  05/10/2021-06/2021  Diagnosis of Type 2 diabetes in Nov. 2022.  Pt has had 2 steroid shots in right shoulder with only about 2 hours of relief.  She was advised not to have any more steroid shots due to her Diabetes  ? ?PRECAUTIONS: None ? ?SUBJECTIVE: Pt reports that she had increased pain following dry needling. ? ?PAIN:  ?Are you having pain? Yes: NPRS scale: 7/10 ?Pain location: R shoulder ?Pain description: burning ?Aggravating factors: movement ?Relieving factors: unknown at this time ? ?PATIENT GOALS:  To have better use of right arm. ? ? ?OBJECTIVE:  ?  ?DIAGNOSTIC FINDINGS:   ?09/23/2021 breast ultrasound:  Overall findings to the right breast including skin thickening as ?well as parenchymal and trabecular thickening are favored to ?represent post lumpectomy and radiation changes. ?  ?PATIENT SURVEYS:  ?FOTO 42% at initial evaluation (projected 48% by visit 11) ?  ?COGNITION: ?          Overall cognitive status: Within functional limits for tasks assessed ?                                 ?SENSATION: ?Light touch: Impaired  on right side ?  ?POSTURE: ?Rounded shoulders, forward head ?  ?UPPER EXTREMITY ROM:  ?  ?Active ROM Right ?12/14/2021 Right ?01/11/2022 Left ?12/14/2021  ?Shoulder flexion 82 106 145  ?Shoulder extension 22  65  ?Shoulder abduction 83 86 140  ?Shoulder adduction       ?Shoulder internal rotation       ?Shoulder external rotation       ?Elbow flexion       ?Elbow extension       ?Wrist flexion       ?Wrist extension       ?Wrist ulnar deviation       ?Wrist radial deviation       ?Wrist pronation       ?Wrist supination       ?(Blank rows = not tested) ?  ?UPPER EXTREMITY MMT: ?  ?MMT Right ?12/14/2021 Left ?12/14/2021  ?Shoulder flexion 2 5  ?Shoulder extension 2 5  ?Shoulder abduction 2 5  ?Shoulder adduction      ?Shoulder internal rotation      ?Shoulder external rotation      ?Middle trapezius      ?Lower trapezius      ?Elbow flexion 3 5  ?Elbow extension 3 5  ?Wrist flexion      ?Wrist extension      ?Wrist ulnar deviation      ?Wrist radial deviation      ?Wrist pronation      ?Wrist supination      ?Grip strength (lbs) 19 60  ?(Blank rows = not tested) ?  ?SHOULDER SPECIAL TESTS: ?           Impingement tests: Hawkins/Kennedy impingement test: positive on right ?           Rotator cuff assessment: Empty can test: positive on right ?            ?PALPATION:  ?Edema noted with sleeve in place.  Tender to palpation over shoulder region. ?            ?TODAY'S TREATMENT:  ? ?01/11/2022: ?Pulleys flexion and abduction x3 min each ?Supine:  AA/ROM shoulder flexion with  cane, chest press, AA/ROM R shoulder ER with cane.  2x10 bilat ?Seated UE ranger for flexion then side to side, CW circles, CCW circles. X1 min each ?Seated bicep curls, shoulder flexion.  1# 2x10 bilat ? ?01/04/2022: ?Pulleys flexion and abduction x3  min each ?Trigger Point Dry-Needling  ?Treatment instructions: Expect mild to moderate muscle soreness. S/S of pneumothorax if dry needled over a lung field, and to seek immediate medical attention should they occur. Patient verbalized understanding of these instructions and education. ? ?Patient Consent Given: Yes ?Education handout provided: Previously provided ?Muscles treated: anterior and middle delt ?Treatment response/outcome: twitch response illicited ?Manual therapy of soft tissue mobilization for muscle elongation to R shoulder and cervical.  Suction cup with gentle pressure for tissue gliding/mobilization. ?PROM to R shoulder in all planes of motion ? ?12/14/2021: ?Wall wash R shoulder flexion and scaption x10 each ?Fwd ball roll out with blue pball x10 with 5 sec hold ?  ?  ?PATIENT EDUCATION: ?Education details: HEP and education on Dry Needling ?Person educated: Patient ?Education method: Explanation and Handouts ?Education comprehension: verbalized understanding ?  ?  ?HOME EXERCISE PROGRAM: ?Access Code: [email or web address] ?URL: [email or web address] ?Date: 12/14/2021 ?Prepared by: Juel Burrow ?  ?Exercises ?- Supine Shoulder Protraction with Dowel  - 2 x daily - 7 x weekly - 1 sets - 10 reps ?- Supine Shoulder Horizontal Abduction Adduction AAROM with Dowel  - 2 x daily - 7 x weekly - 1 sets - 10 reps ?- Supine Shoulder Circles with Dowel Clockwise  - 2 x daily - 7 x weekly - 1 sets - 10 reps ?- Supine Shoulder Flexion Extension AAROM with Dowel  - 1 x daily - 7 x weekly - 1 sets - 10 reps - 5 hold ?- Shoulder Flexion Wall Slide with Towel  - 1 x daily - 7 x weekly - 2 sets - 10 reps ?  ?ASSESSMENT: ?  ?CLINICAL IMPRESSION: ?Ms Nass presents to  PT stating increased pain following dry needling last session and not wanting to do that service again.  Pt able to progress through therex session with some pain, but it returns to baseline once exercise completed. Pt has

## 2022-01-16 ENCOUNTER — Ambulatory Visit: Payer: Medicaid Other | Attending: Adult Health | Admitting: Rehabilitative and Restorative Service Providers"

## 2022-01-16 ENCOUNTER — Other Ambulatory Visit (HOSPITAL_COMMUNITY): Payer: Self-pay

## 2022-01-16 ENCOUNTER — Encounter: Payer: Self-pay | Admitting: Rehabilitative and Restorative Service Providers"

## 2022-01-16 DIAGNOSIS — M25611 Stiffness of right shoulder, not elsewhere classified: Secondary | ICD-10-CM | POA: Insufficient documentation

## 2022-01-16 DIAGNOSIS — R293 Abnormal posture: Secondary | ICD-10-CM | POA: Diagnosis present

## 2022-01-16 DIAGNOSIS — M25511 Pain in right shoulder: Secondary | ICD-10-CM | POA: Insufficient documentation

## 2022-01-16 DIAGNOSIS — Z483 Aftercare following surgery for neoplasm: Secondary | ICD-10-CM | POA: Diagnosis present

## 2022-01-16 NOTE — Therapy (Signed)
?OUTPATIENT PHYSICAL THERAPY TREATMENT NOTE ? ? ?Patient Name: Melody Rodriguez ?MRN: [id number] ?DOB:1976-06-05, 46 y.o., female ?Today's Date: 01/16/2022 ? ?PCP: Patient, [name] ?REFERRING PROVIDER: Wilber Bihari Cornett* ? ?END OF SESSION:  ? PT End of Session - 01/16/22 1016   ? ? Visit Number 4   ? Date for PT Re-Evaluation 02/10/22   ? Authorization Type [employer] - needs auth after 12 visits   ? Authorization - Visit Number 4   ? Authorization - Number of Visits 20   ? PT Start Time 1015   ? PT Stop Time 1055   ? PT Time Calculation (min) 40 min   ? Activity Tolerance Patient tolerated treatment well;Patient limited by pain   ? Behavior During Therapy [facility] for tasks assessed/performed   ? ?  ?  ? ?  ? ? ?Past Medical History:  ?Diagnosis Date  ? Breast cancer ([place])   ? Diabetes mellitus without complication ([place])   ? Family history of colon cancer   ? History of radiation therapy   ? right breast/scv  05/10/21-07/01/21  Dr Gery Pray  ? ?Past Surgical History:  ?Procedure Laterality Date  ? BREAST BIOPSY Right 02/18/2021  ? Procedure: EVACUATION HEMATOMA RIGHT AXILLA;  Surgeon: [name], MD;  Location: [place];  Service: General;  Laterality: Right;  ? BREAST CYST EXCISION Right   ? Patient does not recall (2014 or 2015)  ? BREAST LUMPECTOMY WITH RADIOACTIVE SEED AND SENTINEL LYMPH NODE BIOPSY Right 02/07/2021  ? Procedure: RIGHT BREAST LUMPECTOMY WITH RADIOACTIVE SEED AND SEED TARGETED LYMPH NODE BIOPSY AND SENTINEL LYMPH NODE BIOPSY;  Surgeon: [name], MD;  Location: [place];  Service: General;  Laterality: Right;  ? CESAREAN SECTION    ? IR IMAGING GUIDED PORT INSERTION  09/15/2020  ? IR REMOVAL TUN CV CATH W/O FL  09/15/2020  ? NODE DISSECTION Right 03/28/2021  ? Procedure: RIGHT AXILLARY LYMPH NODE DISSECTION;  Surgeon: [name], MD;  Location: [place];  Service: General;  Laterality: Right;  ?  PORT-A-CATH REMOVAL Left 02/07/2021  ? Procedure: REMOVAL PORT-A-CATH;  Surgeon: [name], MD;  Location: [place];  Service: General;  Laterality: Left;  ? THYROIDECTOMY, PARTIAL    ? ?Patient Active Problem List  ? Diagnosis Date Noted  ? Pap smear for cervical cancer screening 08/19/2021  ? Bartholin cyst 05/06/2021  ? Amenorrhea, secondary 03/02/2021  ? PICC (peripherally inserted central catheter) in place 09/09/2020  ? Genetic testing 08/11/2020  ? Iron deficiency anemia due to chronic blood loss 08/04/2020  ? Family history of colon cancer   ? Malignant neoplasm of upper-outer quadrant of right breast in female, estrogen receptor positive ([place]) 07/28/2020  ? ? ?REFERRING DIAG: M79.601 (ICD-10-CM) - Pain of right upper extremity  ? ?THERAPY DIAG:  ?No diagnosis found. ? ?PERTINENT HISTORY: Patient was diagnosed on 07/13/2020 with right grade 2 invasive ductal carcinoma breast cancer. It is ER/PR positive and HER2 negative with a Ki67 of 20%. Patient underwent neoadjuvant chemotherapy from 09/01/2020 - 01/14/2021. She had a right lumpectomy and sentinel node biopsy (1/1 nodes positive) on 02/07/2021. Hematoma evacuation on 02/18/2021. Axillary dissection on 03/28/2021 with 3/7 positive nodes. Iron deficiency. Radiation  05/10/2021-06/2021  Diagnosis of Type 2 diabetes in Nov. 2022.  Pt has had 2 steroid shots in right shoulder with only about 2 hours of relief.  She was advised not to have any more steroid shots due to  her Diabetes  ? ?PRECAUTIONS: None ? ?SUBJECTIVE: Pt reports that she feels "about the same". ? ?PAIN:  ?Are you having pain? Yes: NPRS scale: 7/10 ?Pain location: R shoulder ?Pain description: burning ?Aggravating factors: movement ?Relieving factors: unknown at this time ? ?PATIENT GOALS:  To have better use of right arm. ? ? ?OBJECTIVE:  ?  ?DIAGNOSTIC FINDINGS:  ?09/23/2021 breast ultrasound:  Overall findings to the right breast including skin thickening as ?well as  parenchymal and trabecular thickening are favored to ?represent post lumpectomy and radiation changes. ?  ?PATIENT SURVEYS:  ?FOTO 42% at initial evaluation (projected 48% by visit 11) ?  ?COGNITION: ?          Overall cognitive status: Within functional limits for tasks assessed ?                                 ?SENSATION: ?Light touch: Impaired  on right side ?  ?POSTURE: ?Rounded shoulders, forward head ?  ?UPPER EXTREMITY ROM:  ?  ?Active ROM Right ?12/14/2021 Right ?01/11/2022 Left ?12/14/2021  ?Shoulder flexion 82 106 145  ?Shoulder extension 22  65  ?Shoulder abduction 83 86 140  ?Shoulder adduction       ?Shoulder internal rotation       ?Shoulder external rotation       ?Elbow flexion       ?Elbow extension       ?Wrist flexion       ?Wrist extension       ?Wrist ulnar deviation       ?Wrist radial deviation       ?Wrist pronation       ?Wrist supination       ?(Blank rows = not tested) ?  ?UPPER EXTREMITY MMT: ?  ?MMT Right ?12/14/2021 Left ?12/14/2021  ?Shoulder flexion 2 5  ?Shoulder extension 2 5  ?Shoulder abduction 2 5  ?Shoulder adduction      ?Shoulder internal rotation      ?Shoulder external rotation      ?Middle trapezius      ?Lower trapezius      ?Elbow flexion 3 5  ?Elbow extension 3 5  ?Wrist flexion      ?Wrist extension      ?Wrist ulnar deviation      ?Wrist radial deviation      ?Wrist pronation      ?Wrist supination      ?Grip strength (lbs) 19 60  ?(Blank rows = not tested) ?  ?SHOULDER SPECIAL TESTS: ?           Impingement tests: Hawkins/Kennedy impingement test: positive on right ?           Rotator cuff assessment: Empty can test: positive on right ?            ?PALPATION:  ?Edema noted with sleeve in place.  Tender to palpation over shoulder region. ?            ?TODAY'S TREATMENT:  ? ?01/16/2022: ?Pulleys flexion and abduction x3 min each ?Seated bicep curls, shoulder flexion.  1# 2x10 bilat with cuing for posture and to slow down ?Seated UE ranger for flexion then side to side, CW  circles, CCW circles. X1 min each ?Supine:  AA/ROM shoulder flexion with cane, chest press, AA/ROM R shoulder ER with cane.  2x10 bilat ?Supine P/ROM to right shoulder and soft tissue mobilization to R upper arm ? ?  01/11/2022: ?Pulleys flexion and abduction x3 min each ?Supine:  AA/ROM shoulder flexion with cane, chest press, AA/ROM R shoulder ER with cane.  2x10 bilat ?Seated UE ranger for flexion then side to side, CW circles, CCW circles. X1 min each ?Seated bicep curls, shoulder flexion.  1# 2x10 bilat ? ?01/04/2022: ?Pulleys flexion and abduction x3 min each ?Trigger Point Dry-Needling  ?Treatment instructions: Expect mild to moderate muscle soreness. S/S of pneumothorax if dry needled over a lung field, and to seek immediate medical attention should they occur. Patient verbalized understanding of these instructions and education. ? ?Patient Consent Given: Yes ?Education handout provided: Previously provided ?Muscles treated: anterior and middle delt ?Treatment response/outcome: twitch response illicited ?Manual therapy of soft tissue mobilization for muscle elongation to R shoulder and cervical.  Suction cup with gentle pressure for tissue gliding/mobilization. ?PROM to R shoulder in all planes of motion ? ?12/14/2021: ?Wall wash R shoulder flexion and scaption x10 each ?Fwd ball roll out with blue pball x10 with 5 sec hold ?  ?  ?PATIENT EDUCATION: ?Education details: HEP and education on Dry Needling ?Person educated: Patient ?Education method: Explanation and Handouts ?Education comprehension: verbalized understanding ?  ?  ?HOME EXERCISE PROGRAM: ?Access Code: [email or web address] ?URL: [email or web address] ?Date: 12/14/2021 ?Prepared by: Juel Burrow ?  ?Exercises ?- Supine Shoulder Protraction with Dowel  - 2 x daily - 7 x weekly - 1 sets - 10 reps ?- Supine Shoulder Horizontal Abduction Adduction AAROM with Dowel  - 2 x daily - 7 x weekly - 1 sets - 10 reps ?- Supine Shoulder Circles with Dowel  Clockwise  - 2 x daily - 7 x weekly - 1 sets - 10 reps ?- Supine Shoulder Flexion Extension AAROM with Dowel  - 1 x daily - 7 x weekly - 1 sets - 10 reps - 5 hold ?- Shoulder Flexion Wall Slide with Towel  - 1 x daily - 7 x week

## 2022-01-18 ENCOUNTER — Encounter: Payer: Self-pay | Admitting: Rehabilitative and Restorative Service Providers"

## 2022-01-18 ENCOUNTER — Ambulatory Visit: Payer: Medicaid Other | Admitting: Rehabilitative and Restorative Service Providers"

## 2022-01-18 DIAGNOSIS — R293 Abnormal posture: Secondary | ICD-10-CM

## 2022-01-18 DIAGNOSIS — M25511 Pain in right shoulder: Secondary | ICD-10-CM

## 2022-01-18 DIAGNOSIS — M25611 Stiffness of right shoulder, not elsewhere classified: Secondary | ICD-10-CM | POA: Diagnosis not present

## 2022-01-18 NOTE — Therapy (Signed)
?OUTPATIENT PHYSICAL THERAPY TREATMENT NOTE ? ? ?Patient Name: Melody Rodriguez ?MRN: [id number] ?DOB:[birth year], 46 y.o., female ?Today's Date: 01/18/2022 ? ?PCP: Patient, [name] ?REFERRING PROVIDER: Wilber Bihari Cornett* ? ?END OF SESSION:  ? PT End of Session - 01/18/22 7829   ? ? Visit Number 5   ? Date for PT Re-Evaluation 02/10/22   ? Authorization Type [employer] - needs auth after 12 visits   ? Authorization - Visit Number 5   ? Authorization - Number of Visits 12   ? PT Start Time [phone]   ? PT Stop Time 1011   ? PT Time Calculation (min) 38 min   ? ?  ?  ? ?  ? ? ?Past Medical History:  ?Diagnosis Date  ? Breast cancer ([place])   ? Diabetes mellitus without complication ([place])   ? Family history of colon cancer   ? History of radiation therapy   ? right breast/scv  05/10/21-07/01/21  Dr Gery Pray  ? ?Past Surgical History:  ?Procedure Laterality Date  ? BREAST BIOPSY Right 02/18/2021  ? Procedure: EVACUATION HEMATOMA RIGHT AXILLA;  Surgeon: [name], MD;  Location: [place];  Service: General;  Laterality: Right;  ? BREAST CYST EXCISION Right   ? Patient does not recall (2014 or 2015)  ? BREAST LUMPECTOMY WITH RADIOACTIVE SEED AND SENTINEL LYMPH NODE BIOPSY Right 02/07/2021  ? Procedure: RIGHT BREAST LUMPECTOMY WITH RADIOACTIVE SEED AND SEED TARGETED LYMPH NODE BIOPSY AND SENTINEL LYMPH NODE BIOPSY;  Surgeon: [name], MD;  Location: [place];  Service: General;  Laterality: Right;  ? CESAREAN SECTION    ? IR IMAGING GUIDED PORT INSERTION  09/15/2020  ? IR REMOVAL TUN CV CATH W/O FL  09/15/2020  ? NODE DISSECTION Right 03/28/2021  ? Procedure: RIGHT AXILLARY LYMPH NODE DISSECTION;  Surgeon: [name], MD;  Location: [place];  Service: General;  Laterality: Right;  ? PORT-A-CATH REMOVAL Left 02/07/2021  ? Procedure: REMOVAL PORT-A-CATH;  Surgeon: [name], MD;  Location: [place];  Service:  General;  Laterality: Left;  ? THYROIDECTOMY, PARTIAL    ? ?Patient Active Problem List  ? Diagnosis Date Noted  ? Pap smear for cervical cancer screening 08/19/2021  ? Bartholin cyst 05/06/2021  ? Amenorrhea, secondary 03/02/2021  ? PICC (peripherally inserted central catheter) in place 09/09/2020  ? Genetic testing 08/11/2020  ? Iron deficiency anemia due to chronic blood loss 08/04/2020  ? Family history of colon cancer   ? Malignant neoplasm of upper-outer quadrant of right breast in female, estrogen receptor positive ([place]) 07/28/2020  ? ? ?REFERRING DIAG: M79.601 (ICD-10-CM) - Pain of right upper extremity  ? ?THERAPY DIAG:  ?Stiffness of right shoulder, not elsewhere classified ? ?Acute pain of right shoulder ? ?Abnormal posture ? ?PERTINENT HISTORY: Patient was diagnosed on 07/13/2020 with right grade 2 invasive ductal carcinoma breast cancer. It is ER/PR positive and HER2 negative with a Ki67 of 20%. Patient underwent neoadjuvant chemotherapy from 09/01/2020 - 01/14/2021. She had a right lumpectomy and sentinel node biopsy (1/1 nodes positive) on 02/07/2021. Hematoma evacuation on 02/18/2021. Axillary dissection on 03/28/2021 with 3/7 positive nodes. Iron deficiency. Radiation  05/10/2021-06/2021  Diagnosis of Type 2 diabetes in Nov. 2022.  Pt has had 2 steroid shots in right shoulder with only about 2 hours of relief.  She was advised not to have any more steroid shots due to her Diabetes  ? ?PRECAUTIONS: None ? ?SUBJECTIVE: Pt  reports that "it has been a bad week" ? ?PAIN:  ?Are you having pain? Yes: NPRS scale: 8/10 ?Pain location: R shoulder ?Pain description: burning ?Aggravating factors: movement ?Relieving factors: unknown at this time ? ?PATIENT GOALS:  To have better use of right arm. ? ? ?OBJECTIVE:  ?  ?DIAGNOSTIC FINDINGS:  ?09/23/2021 breast ultrasound:  Overall findings to the right breast including skin thickening as ?well as parenchymal and trabecular thickening are favored to ?represent post  lumpectomy and radiation changes. ?  ?PATIENT SURVEYS:  ?FOTO 42% at initial evaluation (projected 48% by visit 11) ?  ?COGNITION: ?          Overall cognitive status: Within functional limits for tasks assessed ?                                 ?SENSATION: ?Light touch: Impaired  on right side ?  ?POSTURE: ?Rounded shoulders, forward head ?  ?UPPER EXTREMITY ROM:  ?  ?Active ROM Right ?12/14/2021 Right ?01/11/2022 Left ?12/14/2021  ?Shoulder flexion 82 106 145  ?Shoulder extension 22  65  ?Shoulder abduction 83 86 140  ?Shoulder adduction       ?Shoulder internal rotation       ?Shoulder external rotation       ?Elbow flexion       ?Elbow extension       ?Wrist flexion       ?Wrist extension       ?Wrist ulnar deviation       ?Wrist radial deviation       ?Wrist pronation       ?Wrist supination       ?(Blank rows = not tested) ?  ?UPPER EXTREMITY MMT: ?  ?MMT Right ?12/14/2021 Right ?01/18/2022 Left ?12/14/2021  ?Shoulder flexion 2 2+ 5  ?Shoulder extension 2  5  ?Shoulder abduction 2  5  ?Shoulder adduction       ?Shoulder internal rotation       ?Shoulder external rotation       ?Middle trapezius       ?Lower trapezius       ?Elbow flexion 3 3+ 5  ?Elbow extension 3 3+ 5  ?Wrist flexion       ?Wrist extension       ?Wrist ulnar deviation       ?Wrist radial deviation       ?Wrist pronation       ?Wrist supination       ?Grip strength (lbs) 19 22 60  ?(Blank rows = not tested) ?  ?SHOULDER SPECIAL TESTS: ?           Impingement tests: Hawkins/Kennedy impingement test: positive on right ?           Rotator cuff assessment: Empty can test: positive on right ?            ?PALPATION:  ?Edema noted with sleeve in place.  Tender to palpation over shoulder region. ?            ?TODAY'S TREATMENT:  ? ?01/18/2022: ?Pulleys flexion and abduction x3 min each ?Seated bicep curls, shoulder flexion.  1# 2x10 bilat with cuing for posture and to slow down ?Seated cervical rotation and extension.  X20 bilat ?Reaching 1# weight and placing  it on stool in front of her 2x10 reps RUE ?Green pball rollout for shoulder flexion and shoulder scaption 2x10 each ?Supine with  1# on cane:  AA/ROM shoulder flexion, chest press.  2x10 bilat ? ?01/16/2022: ?Pulleys flexion and abduction x3 min each ?Seated bicep curls, shoulder flexion.  1# 2x10 bilat with cuing for posture and to slow down ?Seated UE ranger for flexion then side to side, CW circles, CCW circles. X1 min each ?Supine:  AA/ROM shoulder flexion with cane, chest press, AA/ROM R shoulder ER with cane.  2x10 bilat ?Supine P/ROM to right shoulder and soft tissue mobilization to R upper arm ? ?01/11/2022: ?Pulleys flexion and abduction x3 min each ?Supine:  AA/ROM shoulder flexion with cane, chest press, AA/ROM R shoulder ER with cane.  2x10 bilat ?Seated UE ranger for flexion then side to side, CW circles, CCW circles. X1 min each ?Seated bicep curls, shoulder flexion.  1# 2x10 bilat ? ?  ?PATIENT EDUCATION: ?Education details: HEP and education on Dry Needling ?Person educated: Patient ?Education method: Explanation and Handouts ?Education comprehension: verbalized understanding ?  ?  ?HOME EXERCISE PROGRAM: ?Access Code: [email or web address] ?URL: [email or web address] ?Date: 12/14/2021 ?Prepared by: Juel Burrow ?  ?Exercises ?- Supine Shoulder Protraction with Dowel  - 2 x daily - 7 x weekly - 1 sets - 10 reps ?- Supine Shoulder Horizontal Abduction Adduction AAROM with Dowel  - 2 x daily - 7 x weekly - 1 sets - 10 reps ?- Supine Shoulder Circles with Dowel Clockwise  - 2 x daily - 7 x weekly - 1 sets - 10 reps ?- Supine Shoulder Flexion Extension AAROM with Dowel  - 1 x daily - 7 x weekly - 1 sets - 10 reps - 5 hold ?- Shoulder Flexion Wall Slide with Towel  - 1 x daily - 7 x weekly - 2 sets - 10 reps ?  ?ASSESSMENT: ?  ?CLINICAL IMPRESSION: ?Ms Prettyman presents to PT stating similar pain to last session. Pt continues to hold R arm in guarded position during PT session.  Pt educated that she needs to  utilize her shoulder and not hold it still throughout the day, as this leads to increased tightness and overall increased pain in the longrun.  Pt verbalizes her understanding, but states "it's hard." Pt ha

## 2022-01-20 ENCOUNTER — Inpatient Hospital Stay: Payer: Medicaid Other | Attending: Hematology and Oncology

## 2022-01-20 ENCOUNTER — Other Ambulatory Visit: Payer: Self-pay

## 2022-01-20 VITALS — BP 136/76 | HR 79 | Temp 98.8°F | Resp 18

## 2022-01-20 DIAGNOSIS — C773 Secondary and unspecified malignant neoplasm of axilla and upper limb lymph nodes: Secondary | ICD-10-CM | POA: Diagnosis present

## 2022-01-20 DIAGNOSIS — C50411 Malignant neoplasm of upper-outer quadrant of right female breast: Secondary | ICD-10-CM | POA: Diagnosis present

## 2022-01-20 DIAGNOSIS — Z9221 Personal history of antineoplastic chemotherapy: Secondary | ICD-10-CM | POA: Diagnosis not present

## 2022-01-20 DIAGNOSIS — Z79899 Other long term (current) drug therapy: Secondary | ICD-10-CM | POA: Insufficient documentation

## 2022-01-20 DIAGNOSIS — Z923 Personal history of irradiation: Secondary | ICD-10-CM | POA: Insufficient documentation

## 2022-01-20 DIAGNOSIS — Z170 Estrogen receptor positive status [ER+]: Secondary | ICD-10-CM | POA: Insufficient documentation

## 2022-01-20 DIAGNOSIS — Z87891 Personal history of nicotine dependence: Secondary | ICD-10-CM | POA: Insufficient documentation

## 2022-01-20 DIAGNOSIS — Z79811 Long term (current) use of aromatase inhibitors: Secondary | ICD-10-CM | POA: Diagnosis not present

## 2022-01-20 DIAGNOSIS — Z5111 Encounter for antineoplastic chemotherapy: Secondary | ICD-10-CM | POA: Diagnosis present

## 2022-01-20 DIAGNOSIS — M25511 Pain in right shoulder: Secondary | ICD-10-CM | POA: Insufficient documentation

## 2022-01-20 MED ORDER — GOSERELIN ACETATE 3.6 MG ~~LOC~~ IMPL
3.6000 mg | DRUG_IMPLANT | Freq: Once | SUBCUTANEOUS | Status: AC
  Administered 2022-01-20: 3.6 mg via SUBCUTANEOUS
  Filled 2022-01-20: qty 3.6

## 2022-01-23 ENCOUNTER — Encounter: Payer: Self-pay | Admitting: Rehabilitative and Restorative Service Providers"

## 2022-01-23 ENCOUNTER — Ambulatory Visit: Payer: Medicaid Other | Admitting: Rehabilitative and Restorative Service Providers"

## 2022-01-23 DIAGNOSIS — R293 Abnormal posture: Secondary | ICD-10-CM

## 2022-01-23 DIAGNOSIS — M25511 Pain in right shoulder: Secondary | ICD-10-CM

## 2022-01-23 DIAGNOSIS — M25611 Stiffness of right shoulder, not elsewhere classified: Secondary | ICD-10-CM | POA: Diagnosis not present

## 2022-01-23 NOTE — Therapy (Signed)
?OUTPATIENT PHYSICAL THERAPY TREATMENT NOTE ? ? ?Patient Name: Melody Rodriguez ?MRN: [id number] ?DOB:Jan 29, 1976, 46 y.o., female ?Today's Date: 01/23/2022 ? ?PCP: Patient, [name] ?REFERRING PROVIDER: Wilber Bihari Cornett* ? ?END OF SESSION:  ? PT End of Session - 01/23/22 0939   ? ? Visit Number 6   ? Date for PT Re-Evaluation 02/10/22   ? Authorization Type [employer] - needs auth after 12 visits   ? Authorization - Visit Number 6   ? Authorization - Number of Visits 12   ? PT Start Time [phone]   ? PT Stop Time 1013   ? PT Time Calculation (min) 40 min   ? Activity Tolerance Patient tolerated treatment well;Patient limited by pain   ? Behavior During Therapy [facility] for tasks assessed/performed   ? ?  ?  ? ?  ? ? ?Past Medical History:  ?Diagnosis Date  ? Breast cancer ([place])   ? Diabetes mellitus without complication ([place])   ? Family history of colon cancer   ? History of radiation therapy   ? right breast/scv  05/10/21-07/01/21  Dr Gery Pray  ? ?Past Surgical History:  ?Procedure Laterality Date  ? BREAST BIOPSY Right 02/18/2021  ? Procedure: EVACUATION HEMATOMA RIGHT AXILLA;  Surgeon: [name], MD;  Location: [place];  Service: General;  Laterality: Right;  ? BREAST CYST EXCISION Right   ? Patient does not recall (2014 or 2015)  ? BREAST LUMPECTOMY WITH RADIOACTIVE SEED AND SENTINEL LYMPH NODE BIOPSY Right 02/07/2021  ? Procedure: RIGHT BREAST LUMPECTOMY WITH RADIOACTIVE SEED AND SEED TARGETED LYMPH NODE BIOPSY AND SENTINEL LYMPH NODE BIOPSY;  Surgeon: [name], MD;  Location: [place];  Service: General;  Laterality: Right;  ? CESAREAN SECTION    ? IR IMAGING GUIDED PORT INSERTION  09/15/2020  ? IR REMOVAL TUN CV CATH W/O FL  09/15/2020  ? NODE DISSECTION Right 03/28/2021  ? Procedure: RIGHT AXILLARY LYMPH NODE DISSECTION;  Surgeon: [name], MD;  Location: [place];  Service: General;  Laterality: Right;  ?  PORT-A-CATH REMOVAL Left 02/07/2021  ? Procedure: REMOVAL PORT-A-CATH;  Surgeon: [name], MD;  Location: [place];  Service: General;  Laterality: Left;  ? THYROIDECTOMY, PARTIAL    ? ?Patient Active Problem List  ? Diagnosis Date Noted  ? Pap smear for cervical cancer screening 08/19/2021  ? Bartholin cyst 05/06/2021  ? Amenorrhea, secondary 03/02/2021  ? PICC (peripherally inserted central catheter) in place 09/09/2020  ? Genetic testing 08/11/2020  ? Iron deficiency anemia due to chronic blood loss 08/04/2020  ? Family history of colon cancer   ? Malignant neoplasm of upper-outer quadrant of right breast in female, estrogen receptor positive ([place]) 07/28/2020  ? ? ?REFERRING DIAG: M79.601 (ICD-10-CM) - Pain of right upper extremity  ? ?THERAPY DIAG:  ?Stiffness of right shoulder, not elsewhere classified ? ?Acute pain of right shoulder ? ?Abnormal posture ? ?PERTINENT HISTORY: Patient was diagnosed on 07/13/2020 with right grade 2 invasive ductal carcinoma breast cancer. It is ER/PR positive and HER2 negative with a Ki67 of 20%. Patient underwent neoadjuvant chemotherapy from 09/01/2020 - 01/14/2021. She had a right lumpectomy and sentinel node biopsy (1/1 nodes positive) on 02/07/2021. Hematoma evacuation on 02/18/2021. Axillary dissection on 03/28/2021 with 3/7 positive nodes. Iron deficiency. Radiation  05/10/2021-06/2021  Diagnosis of Type 2 diabetes in Nov. 2022.  Pt has had 2 steroid shots in right shoulder with only about 2 hours of relief.  She was advised not to have any more steroid shots due to her Diabetes  ? ?PRECAUTIONS: None ? ?SUBJECTIVE: Pt reports that "it has been a bad week" ? ?PAIN:  ?Are you having pain? Yes: NPRS scale: 8/10 ?Pain location: R shoulder ?Pain description: burning ?Aggravating factors: movement ?Relieving factors: unknown at this time ? ?PATIENT GOALS:  To have better use of right arm. ? ? ?OBJECTIVE:  ?  ?DIAGNOSTIC FINDINGS:  ?09/23/2021 breast ultrasound:   Overall findings to the right breast including skin thickening as ?well as parenchymal and trabecular thickening are favored to ?represent post lumpectomy and radiation changes. ?  ?PATIENT SURVEYS:  ?FOTO 42% at initial evaluation (projected 48% by visit 11) ?  ?COGNITION: ?          Overall cognitive status: Within functional limits for tasks assessed ?                                 ?SENSATION: ?Light touch: Impaired  on right side ?  ?POSTURE: ?Rounded shoulders, forward head ?  ?UPPER EXTREMITY ROM:  ?  ?Active ROM Right ?12/14/2021 Right ?01/11/2022 Left ?12/14/2021  ?Shoulder flexion 82 106 145  ?Shoulder extension 22  65  ?Shoulder abduction 83 86 140  ?Shoulder adduction       ?Shoulder internal rotation       ?Shoulder external rotation       ?Elbow flexion       ?Elbow extension       ?Wrist flexion       ?Wrist extension       ?Wrist ulnar deviation       ?Wrist radial deviation       ?Wrist pronation       ?Wrist supination       ?(Blank rows = not tested) ?  ?UPPER EXTREMITY MMT: ?  ?MMT Right ?12/14/2021 Right ?01/18/2022 Left ?12/14/2021  ?Shoulder flexion 2 2+ 5  ?Shoulder extension 2  5  ?Shoulder abduction 2  5  ?Shoulder adduction       ?Shoulder internal rotation       ?Shoulder external rotation       ?Middle trapezius       ?Lower trapezius       ?Elbow flexion 3 3+ 5  ?Elbow extension 3 3+ 5  ?Wrist flexion       ?Wrist extension       ?Wrist ulnar deviation       ?Wrist radial deviation       ?Wrist pronation       ?Wrist supination       ?Grip strength (lbs) 19 22 60  ?(Blank rows = not tested) ?  ?SHOULDER SPECIAL TESTS: ?           Impingement tests: Hawkins/Kennedy impingement test: positive on right ?           Rotator cuff assessment: Empty can test: positive on right ?            ?PALPATION:  ?Edema noted with sleeve in place.  Tender to palpation over shoulder region. ?            ?TODAY'S TREATMENT:  ? ?01/23/2022: ?Pulleys flexion and abduction x3 min each ?Seated bicep curls, shoulder  flexion.  1# 2x10 bilat with cuing for posture and to slow down ?Seated cervical rotation and extension.  X20 bilat ?Reaching 1# weight and placing it on  stool in front of her 2x10 reps RUE ?Green pball rollout for shoulder flexion and shoulder scaption 2x10 each ?Supine with 1# on cane:  AA/ROM shoulder flexion, chest press.  2x10 bilat ? ?01/18/2022: ?Pulleys flexion and abduction x3 min each ?Seated bicep curls, shoulder flexion.  1# 2x10 bilat with cuing for posture and to slow down ?Seated cervical rotation and extension.  X20 bilat ?Reaching 1# weight and placing it on stool in front of her 2x10 reps RUE ?Green pball rollout for shoulder flexion and shoulder scaption 2x10 each ?Supine with 1# on cane:  AA/ROM shoulder flexion, chest press.  2x10 bilat ? ?01/16/2022: ?Pulleys flexion and abduction x3 min each ?Seated bicep curls, shoulder flexion.  1# 2x10 bilat with cuing for posture and to slow down ?Seated UE ranger for flexion then side to side, CW circles, CCW circles. X1 min each ?Supine:  AA/ROM shoulder flexion with cane, chest press, AA/ROM R shoulder ER with cane.  2x10 bilat ?Supine P/ROM to right shoulder and soft tissue mobilization to R upper arm ? ?01/11/2022: ?Pulleys flexion and abduction x3 min each ?Supine:  AA/ROM shoulder flexion with cane, chest press, AA/ROM R shoulder ER with cane.  2x10 bilat ?Seated UE ranger for flexion then side to side, CW circles, CCW circles. X1 min each ?Seated bicep curls, shoulder flexion.  1# 2x10 bilat ? ?  ?PATIENT EDUCATION: ?Education details: HEP and education on Dry Needling ?Person educated: Patient ?Education method: Explanation and Handouts ?Education comprehension: verbalized understanding ?  ?  ?HOME EXERCISE PROGRAM: ?Access Code: [email or web address] ?URL: [email or web address] ?Date: 12/14/2021 ?Prepared by: Juel Burrow ?  ?Exercises ?- Supine Shoulder Protraction with Dowel  - 2 x daily - 7 x weekly - 1 sets - 10 reps ?- Supine Shoulder  Horizontal Abduction Adduction AAROM with Dowel  - 2 x daily - 7 x weekly - 1 sets - 10 reps ?- Supine Shoulder Circles with Dowel Clockwise  - 2 x daily - 7 x weekly - 1 sets - 10 reps ?- Supine Shoulder Flexion Extension

## 2022-01-24 ENCOUNTER — Other Ambulatory Visit (HOSPITAL_COMMUNITY): Payer: Self-pay

## 2022-01-24 ENCOUNTER — Other Ambulatory Visit: Payer: Self-pay

## 2022-01-24 ENCOUNTER — Inpatient Hospital Stay (HOSPITAL_BASED_OUTPATIENT_CLINIC_OR_DEPARTMENT_OTHER): Payer: Medicaid Other | Admitting: Adult Health

## 2022-01-24 ENCOUNTER — Encounter: Payer: Self-pay | Admitting: Adult Health

## 2022-01-24 DIAGNOSIS — C50411 Malignant neoplasm of upper-outer quadrant of right female breast: Secondary | ICD-10-CM | POA: Diagnosis not present

## 2022-01-24 DIAGNOSIS — M545 Low back pain, unspecified: Secondary | ICD-10-CM

## 2022-01-24 DIAGNOSIS — Z170 Estrogen receptor positive status [ER+]: Secondary | ICD-10-CM | POA: Diagnosis not present

## 2022-01-24 DIAGNOSIS — E119 Type 2 diabetes mellitus without complications: Secondary | ICD-10-CM | POA: Diagnosis not present

## 2022-01-24 MED ORDER — ANASTROZOLE 1 MG PO TABS
1.0000 mg | ORAL_TABLET | Freq: Every day | ORAL | 0 refills | Status: DC
  Filled 2022-01-24: qty 30, 30d supply, fill #0

## 2022-01-24 NOTE — Assessment & Plan Note (Addendum)
07/28/2020:Patient palpated a right breast mass for 1-2 years. Mammogram showed a 2.2cm mass at the 11 o'clock position with surrounding calcifications, 6.4cm in total extent, and up to 5 abnormal right axillary lymph nodes. 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 ?? ?Treatment plan: ?1. Neoadjuvant chemotherapy (MammaPrint test High Risk): AC foll by Taxol?completed 01/11/21 ?2.?Right lumpectomy: 02/07/2021: Grade 2 IDC 2.8 cm with DCIS, margins negative, lymphovascular space invasion present, 1/1 lymph node positive with extracapsular extension, ER 50% weak, PR 20% strong, HER2 negative, Ki-67 20% ?3. Adjuvant radiation therapy?05/11/2021-07/04/2021 ?4. Follow-up adjuvant antiestrogen therapy?along with abemaciclib (patient has total of 4 lymph nodes positive), to start 07/19/2021 ?[place] [id number]: Treatment of refractory nausea ?5. ALND 03/28/21: 3/7 LN positive ?-------------------------------------------------------------------------------------------------------------------------------? ?Current treatment: Zoladex plus letrozole plus Verzenio (Not started yet) ?She is stiff and notes that is improved off Letrozole so we changed her to Anastrozole ?She will continue Zoladex every 4 weeks.  She wants to consider TAH/BSO and I placed a referral to GYN-oncology to discuss this possibility with her on 02/25/2022.   ? ?For her shoulder pain I recommended she continue with PT.  I referred her the pain clinic for evaluation to identify any other etiology.   ? ?Bone Density: 08/31/21: T Score 1.1 (Normal) ?Mammograms: 09/23/21: Benign, density Cat C  ? ?Chair will return on 6/10 for Zoladex and 7/10 for f/u with Gudena and Zoladex. ?

## 2022-01-24 NOTE — Progress Notes (Signed)
[place] [facility] Follow up: ?  ? ?Patient, [name] ?No address on file ? ? ?DIAGNOSIS:  Cancer Staging  ?Malignant neoplasm of upper-outer quadrant of right breast in female, estrogen receptor positive ([place]) ?Staging form: Breast, AJCC 8th Edition ?- Clinical stage from 08/04/2020: Stage IIA (cT2, cN1, cM0, G2, ER+, PR+, HER2-) - Unsigned ?Stage prefix: Initial diagnosis ?Histologic grading system: 3 grade system ? ?I connected with Chair Steinhaus on 01/24/22 at  8:15 AM EDT by telephone and verified that I am speaking with the correct person using two identifiers.  ?I discussed the limitations, risks, security and privacy concerns of performing an evaluation and management service by telephone and the availability of in person appointments.  ?I also discussed with the patient that there may be a patient responsible charge related to this service. The patient expressed understanding and agreed to proceed.  ?Patient location:home in [place] ?Provider location: [facility] office ?Others participating in call: none ? ?SUMMARY OF ONCOLOGIC HISTORY: ?Oncology History  ?Malignant neoplasm of upper-outer quadrant of right breast in female, estrogen receptor positive ([place])  ?07/28/2020 Initial Diagnosis  ? Patient palpated a right breast mass for 1-2 years. Mammogram showed a 2.2cm mass at the 11 o'clock position with surrounding calcifications, 6.4cm in total extent, and up to 5 abnormal right axillary lymph nodes. 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  ?  ?08/05/2020 Miscellaneous  ? MammaPrint: High risk luminal type B ?  ?08/11/2020 Genetic Testing  ? Negative genetic testing: no pathogenic variants detected in [facility] Breast Cancer STAT Panel or Common Hereditary Cancers panel. The report dates are August 11, 2020 and August 19, 2020, respectively.  Two variants of uncertain signficance were detected - one in the CTNNA1 gene called c.86del and the second in the MLH1 gene called c.808A>G.  ? ?UPDATE:  The MLH1 c.808A>G VUS was reclassified to "Likely Benign" on 02/19/2021. The change in variant classification was made as a result of re-review of the evidence in light of new variant interpretation guidelines and/or new information.  ? ?The STAT Breast cancer panel offered by [facility] includes sequencing and rearrangement analysis for the following 9 genes:  ATM, BRCA1, BRCA2, CDH1, CHEK2, PALB2, PTEN, STK11 and TP53.  The Common Hereditary Cancers Panel offered by [facility] includes sequencing and/or deletion duplication testing of the following 48 genes: APC, ATM, AXIN2, BARD1, BMPR1A, BRCA1, BRCA2, BRIP1, CDH1, CDK4, CDKN2A (p14ARF), CDKN2A (p16INK4a), CHEK2, CTNNA1, DICER1, EPCAM (Deletion/duplication testing only), GREM1 (promoter region deletion/duplication testing only), KIT, MEN1, MLH1, MSH2, MSH3, MSH6, MUTYH, NBN, NF1, NTHL1, PALB2, PDGFRA, PMS2, POLD1, POLE, PTEN, RAD50, RAD51C, RAD51D, RNF43, SDHB, SDHC, SDHD, SMAD4, SMARCA4. STK11, TP53, TSC1, TSC2, and VHL.  The following genes were evaluated for sequence changes only: SDHA and HOXB13 c.251G>A variant only.  ?  ?09/01/2020 - 01/11/2021 Neo-Adjuvant Chemotherapy  ? Adriamycin and Cytoxan x4 09/01/2020-10/12/2020 ?Weekly Taxol x 12  10/26/2020-01/11/2021(dose reduced d/t AE) ?  ?02/07/2021 Surgery  ? Right lumpectomy [name]): invasive and in situ ductal carcinoma, 2.8cm, clear margins, with metastatic carcinoma in 1/1 right axillary lymph nodes. ?  ?03/28/2021 Surgery  ? Axillary lymph node dissection: 3/7 lymph nodes + ?  ?05/10/2021 - 07/01/2021 Radiation Therapy  ? Site Technique Total Dose (Gy) Dose per Fx (Gy) Completed Fx Beam Energies  ?Breast, Right: Breast_Rt 3D 50.4/50.4 1.8 28/28 10X  ?  Breast, Right: Breast_Rt_Bst specialPort 12/12 2 6/6 15E  ?Sclav-RT: SCV_Rt 3D 50.4/50.4 1.8 28/28   ? ?  ?07/2021 -   Anti-estrogen oral therapy  ? Zoladex + Letrozole + Verzenio ?  ? ? ?CURRENT THERAPY: Zoladex, Letrozole ? ?INTERVAL HISTORY: ?SIDONIA NUTTER 46 y.o. female returns for follow-up.  She was seen in urgent care on 01/09/2022 for dental abscess and completed a course of antibiotics with Clindamycin.   ? ?She has continued to see PT for her right shoulder tightness and her ROM has improved based on their measurements.  She has stopped the Letrozole due to her shoulder pain on 12/08/2021.  She has continued on Zoladex monthly.  Stopping the letrozole did not lead to improvement.   ? ? ?Patient Active Problem List  ? Diagnosis Date Noted  ? Type 2 diabetes mellitus ([place]) 01/24/2022  ? Pap smear for cervical cancer screening 08/19/2021  ? Amenorrhea, secondary 03/02/2021  ? Genetic testing 08/11/2020  ? Iron deficiency anemia due to chronic blood loss 08/04/2020  ? Family history of colon cancer   ? Malignant neoplasm of upper-outer quadrant of right breast in female, estrogen receptor positive ([place]) 07/28/2020  ? ? ?is allergic to cephalexin, latex, naproxen, and tramadol. ? ?MEDICAL HISTORY: ?Past Medical History:  ?Diagnosis Date  ? Breast cancer ([place])   ? Diabetes mellitus without complication ([place])   ? Family history of colon cancer   ? History of radiation therapy   ? right breast/scv  05/10/21-07/01/21  Dr Gery Pray  ? ? ?SURGICAL HISTORY: ?Past Surgical History:  ?Procedure Laterality Date  ? BREAST BIOPSY Right 02/18/2021  ? Procedure: EVACUATION HEMATOMA RIGHT AXILLA;  Surgeon: [name], MD;  Location: [place];  Service: General;  Laterality: Right;  ? BREAST CYST EXCISION Right   ? Patient does not recall (2014 or 2015)  ? BREAST LUMPECTOMY WITH RADIOACTIVE SEED AND SENTINEL LYMPH NODE BIOPSY Right 02/07/2021  ? Procedure: RIGHT BREAST LUMPECTOMY WITH RADIOACTIVE SEED AND SEED TARGETED LYMPH NODE BIOPSY AND SENTINEL LYMPH NODE BIOPSY;  Surgeon: [name], MD;  Location: [place];  Service: General;  Laterality: Right;  ? CESAREAN SECTION    ? IR IMAGING GUIDED PORT INSERTION  09/15/2020  ? IR REMOVAL TUN CV CATH W/O FL  09/15/2020  ? NODE DISSECTION Right 03/28/2021  ? Procedure: RIGHT AXILLARY LYMPH NODE DISSECTION;  Surgeon: [name], MD;  Location: [place];  Service: General;  Laterality: Right;  ? PORT-A-CATH REMOVAL Left 02/07/2021  ? Procedure: REMOVAL PORT-A-CATH;  Surgeon: [name], MD;  Location: [place];  Service: General;  Laterality: Left;  ? THYROIDECTOMY, PARTIAL    ? ? ?SOCIAL HISTORY: ?Social History  ? ?Socioeconomic History  ? Marital status: Single  ?  Spouse name: Not on file  ? Number of children: 2  ? Years of education: Not on file  ? Highest education level: Some college, no degree  ?Occupational History  ? Not on file  ?Tobacco Use  ? Smoking status: Former  ?  Packs/day: 1.00  ?  Years: 22.00  ?  Pack years: 22.00  ?  Types: Cigars, Cigarettes  ? Smokeless tobacco: Never  ? Tobacco comments:  ?  quit 07/2020  ?Vaping Use  ? Vaping Use: Never used  ?Substance and Sexual Activity  ? Alcohol use: Yes  ?  Comment: occas  ? Drug use: Yes  ?  Types: Marijuana  ?  Comment: chemo/  pain mgmt  ? Sexual activity: Not Currently  ?Other Topics Concern  ? Not on file  ?Social History Narrative  ? Not on file  ? ?Social Determinants of Health  ? ?Financial Resource Strain: High Risk  ? Difficulty of Paying Living Expenses: Hard  ?Food Insecurity: Food Insecurity Present  ? Worried About Charity fundraiser in the Last Year: Sometimes true  ? Ran Out of Food in the Last Year: Sometimes true  ?Transportation Needs: No Transportation Needs  ? Lack of Transportation (Medical): No  ? Lack of Transportation (Non-Medical): No  ?Physical Activity: Inactive  ? Days of Exercise per Week: 0 days  ? Minutes of Exercise per Session: 0 min  ?Stress: Stress Concern Present  ? Feeling of Stress : To some extent  ?Social  Connections: Socially Isolated  ? Frequency of Communication with Friends and Family: Three times a week  ? Frequency of Social Gatherings with Friends and Family: More than three times a week  ? Attends [employer]

## 2022-01-25 ENCOUNTER — Encounter: Payer: Self-pay | Admitting: Rehabilitative and Restorative Service Providers"

## 2022-01-25 ENCOUNTER — Ambulatory Visit: Payer: Medicaid Other | Admitting: Rehabilitative and Restorative Service Providers"

## 2022-01-25 DIAGNOSIS — R293 Abnormal posture: Secondary | ICD-10-CM

## 2022-01-25 DIAGNOSIS — M25611 Stiffness of right shoulder, not elsewhere classified: Secondary | ICD-10-CM | POA: Diagnosis not present

## 2022-01-25 DIAGNOSIS — M25511 Pain in right shoulder: Secondary | ICD-10-CM

## 2022-01-25 NOTE — Therapy (Signed)
?OUTPATIENT PHYSICAL THERAPY TREATMENT NOTE ? ? ?Patient Name: Melody Rodriguez ?MRN: [id number] ?DOB:1976-06-18, 46 y.o., female ?Today's Date: 01/25/2022 ? ?PCP: Patient, [name] ?REFERRING PROVIDER: Wilber Bihari Cornett* ? ?END OF SESSION:  ? PT End of Session - 01/25/22 0272   ? ? Visit Number 7   ? Date for PT Re-Evaluation 02/10/22   ? Authorization Type [employer] - needs auth after 12 visits   ? Authorization Time Period 12/14/2021-02/10/2022   ? Authorization - Visit Number 7   ? Authorization - Number of Visits 16   ? Progress Note Due on Visit 10   ? PT Start Time [phone]   ? PT Stop Time 1005   ? PT Time Calculation (min) 29 min   ? Activity Tolerance Patient tolerated treatment well;Patient limited by pain   ? Behavior During Therapy [facility] for tasks assessed/performed   ? ?  ?  ? ?  ? ? ?Past Medical History:  ?Diagnosis Date  ? Breast cancer ([place])   ? Diabetes mellitus without complication ([place])   ? Family history of colon cancer   ? History of radiation therapy   ? right breast/scv  05/10/21-07/01/21  Dr Gery Pray  ? ?Past Surgical History:  ?Procedure Laterality Date  ? BREAST BIOPSY Right 02/18/2021  ? Procedure: EVACUATION HEMATOMA RIGHT AXILLA;  Surgeon: [name], MD;  Location: [place];  Service: General;  Laterality: Right;  ? BREAST CYST EXCISION Right   ? Patient does not recall (2014 or 2015)  ? BREAST LUMPECTOMY WITH RADIOACTIVE SEED AND SENTINEL LYMPH NODE BIOPSY Right 02/07/2021  ? Procedure: RIGHT BREAST LUMPECTOMY WITH RADIOACTIVE SEED AND SEED TARGETED LYMPH NODE BIOPSY AND SENTINEL LYMPH NODE BIOPSY;  Surgeon: [name], MD;  Location: [place];  Service: General;  Laterality: Right;  ? CESAREAN SECTION    ? IR IMAGING GUIDED PORT INSERTION  09/15/2020  ? IR REMOVAL TUN CV CATH W/O FL  09/15/2020  ? NODE DISSECTION Right 03/28/2021  ? Procedure: RIGHT AXILLARY LYMPH NODE DISSECTION;  Surgeon: [name], MD;   Location: [place];  Service: General;  Laterality: Right;  ? PORT-A-CATH REMOVAL Left 02/07/2021  ? Procedure: REMOVAL PORT-A-CATH;  Surgeon: [name], MD;  Location: [place];  Service: General;  Laterality: Left;  ? THYROIDECTOMY, PARTIAL    ? ?Patient Active Problem List  ? Diagnosis Date Noted  ? Type 2 diabetes mellitus ([place]) 01/24/2022  ? Pap smear for cervical cancer screening 08/19/2021  ? Amenorrhea, secondary 03/02/2021  ? Genetic testing 08/11/2020  ? Iron deficiency anemia due to chronic blood loss 08/04/2020  ? Family history of colon cancer   ? Malignant neoplasm of upper-outer quadrant of right breast in female, estrogen receptor positive ([place]) 07/28/2020  ? ? ?REFERRING DIAG: M79.601 (ICD-10-CM) - Pain of right upper extremity  ? ?THERAPY DIAG:  ?Stiffness of right shoulder, not elsewhere classified ? ?Acute pain of right shoulder ? ?Abnormal posture ? ?PERTINENT HISTORY: Patient was diagnosed on 07/13/2020 with right grade 2 invasive ductal carcinoma breast cancer. It is ER/PR positive and HER2 negative with a Ki67 of 20%. Patient underwent neoadjuvant chemotherapy from 09/01/2020 - 01/14/2021. She had a right lumpectomy and sentinel node biopsy (1/1 nodes positive) on 02/07/2021. Hematoma evacuation on 02/18/2021. Axillary dissection on 03/28/2021 with 3/7 positive nodes. Iron deficiency. Radiation  05/10/2021-06/2021  Diagnosis of Type 2 diabetes in Nov. 2022.  Pt has had 2 steroid shots in  right shoulder with only about 2 hours of relief.  She was advised not to have any more steroid shots due to her Diabetes  ? ?PRECAUTIONS: None ? ?SUBJECTIVE: Pt states that she is not feeling well, but is going to try to stay as long as she can.  Reports stomach discomfort. ? ?PAIN:  ?Are you having pain? Yes: NPRS scale: 7/10 ?Pain location: R shoulder ?Pain description: burning ?Aggravating factors: movement ?Relieving factors: unknown at this time ? ?PATIENT GOALS:  To  have better use of right arm. ? ? ?OBJECTIVE:  ?  ?DIAGNOSTIC FINDINGS:  ?09/23/2021 breast ultrasound:  Overall findings to the right breast including skin thickening as ?well as parenchymal and trabecular thickening are favored to ?represent post lumpectomy and radiation changes. ?  ?PATIENT SURVEYS:  ?FOTO 42% at initial evaluation (projected 48% by visit 11) ?  ?COGNITION: ?          Overall cognitive status: Within functional limits for tasks assessed ?                                 ?SENSATION: ?Light touch: Impaired  on right side ?  ?POSTURE: ?Rounded shoulders, forward head ?  ?UPPER EXTREMITY ROM:  ?  ?Active ROM Right ?12/14/2021 Right ?01/11/2022 Left ?12/14/2021  ?Shoulder flexion 82 106 145  ?Shoulder extension 22  65  ?Shoulder abduction 83 86 140  ?Shoulder adduction       ?Shoulder internal rotation       ?Shoulder external rotation       ?Elbow flexion       ?Elbow extension       ?Wrist flexion       ?Wrist extension       ?Wrist ulnar deviation       ?Wrist radial deviation       ?Wrist pronation       ?Wrist supination       ?(Blank rows = not tested) ?  ?UPPER EXTREMITY MMT: ?  ?MMT Right ?12/14/2021 Right ?01/18/2022 Left ?12/14/2021  ?Shoulder flexion 2 2+ 5  ?Shoulder extension 2  5  ?Shoulder abduction 2  5  ?Shoulder adduction       ?Shoulder internal rotation       ?Shoulder external rotation       ?Middle trapezius       ?Lower trapezius       ?Elbow flexion 3 3+ 5  ?Elbow extension 3 3+ 5  ?Wrist flexion       ?Wrist extension       ?Wrist ulnar deviation       ?Wrist radial deviation       ?Wrist pronation       ?Wrist supination       ?Grip strength (lbs) 19 22 60  ?(Blank rows = not tested) ?  ?SHOULDER SPECIAL TESTS: ?           Impingement tests: Hawkins/Kennedy impingement test: positive on right ?           Rotator cuff assessment: Empty can test: positive on right ?            ?PALPATION:  ?Edema noted with sleeve in place.  Tender to palpation over shoulder region. ?             ?TODAY'S TREATMENT:  ? ?01/25/2022: ?Pulleys flexion and abduction x3 min each ?Seated bicep curls, shoulder flexion.  1#  2x10 bilat with cuing for posture and to slow down ?Seated cervical rotation and extension.  X20 bilat ?Reaching 1# weight and placing it on stool in front of her 2x10 reps RUE ?Green pball rollout for shoulder flexion  2x10  ? ?01/23/2022: ?Pulleys flexion and abduction x3 min each ?Seated bicep curls, shoulder flexion.  1# 2x10 bilat with cuing for posture and to slow down ?Seated cervical rotation and extension.  X20 bilat ?Reaching 1# weight and placing it on stool in front of her 2x10 reps RUE ?Green pball rollout for shoulder flexion and shoulder scaption 2x10 each ?Supine with 1# on cane:  AA/ROM shoulder flexion, chest press.  2x10 bilat ? ?01/18/2022: ?Pulleys flexion and abduction x3 min each ?Seated bicep curls, shoulder flexion.  1# 2x10 bilat with cuing for posture and to slow down ?Seated cervical rotation and extension.  X20 bilat ?Reaching 1# weight and placing it on stool in front of her 2x10 reps RUE ?Green pball rollout for shoulder flexion and shoulder scaption 2x10 each ?Supine with 1# on cane:  AA/ROM shoulder flexion, chest press.  2x10 bilat ? ? ?  ?PATIENT EDUCATION: ?Education details: HEP and education on Dry Needling ?Person educated: Patient ?Education method: Explanation and Handouts ?Education comprehension: verbalized understanding ?  ?  ?HOME EXERCISE PROGRAM: ?Access Code: [email or web address] ?URL: [email or web address] ?Date: 12/14/2021 ?Prepared by: Juel Burrow ?  ?Exercises ?- Supine Shoulder Protraction with Dowel  - 2 x daily - 7 x weekly - 1 sets - 10 reps ?- Supine Shoulder Horizontal Abduction Adduction AAROM with Dowel  - 2 x daily - 7 x weekly - 1 sets - 10 reps ?- Supine Shoulder Circles with Dowel Clockwise  - 2 x daily - 7 x weekly - 1 sets - 10 reps ?- Supine Shoulder Flexion Extension AAROM with Dowel  - 1 x daily - 7 x weekly - 1 sets - 10 reps -  5 hold ?- Shoulder Flexion Wall Slide with Towel  - 1 x daily - 7 x weekly - 2 sets - 10 reps ?  ?ASSESSMENT: ?  ?CLINICAL IMPRESSION: ?Ms Hautala presents to PT stating that she is not feeling well and session li

## 2022-01-26 ENCOUNTER — Telehealth: Payer: Self-pay | Admitting: *Deleted

## 2022-01-26 NOTE — Telephone Encounter (Signed)
Spoke with the patient and scheduled a new patient with Dr Berline Lopes on 6/9 at 9 am. Patient given an arrival time of 8:30 am. Patient also given the address and phone number for the clinic; along with the policy for mask and visitors.   ?

## 2022-01-27 ENCOUNTER — Telehealth: Payer: Self-pay

## 2022-01-27 NOTE — Telephone Encounter (Signed)
Patient notified of requested paperwork completed and sent to [place]. Fax transmission confirmation received. Copy of paperwork mailed to Patient as requested.No other needs or concerns voiced at this time. ?

## 2022-01-30 ENCOUNTER — Ambulatory Visit: Payer: Medicaid Other | Admitting: Rehabilitative and Restorative Service Providers"

## 2022-01-30 ENCOUNTER — Encounter: Payer: Self-pay | Admitting: Rehabilitative and Restorative Service Providers"

## 2022-01-30 DIAGNOSIS — M25611 Stiffness of right shoulder, not elsewhere classified: Secondary | ICD-10-CM

## 2022-01-30 DIAGNOSIS — R293 Abnormal posture: Secondary | ICD-10-CM

## 2022-01-30 DIAGNOSIS — M25511 Pain in right shoulder: Secondary | ICD-10-CM

## 2022-01-30 NOTE — Therapy (Signed)
?OUTPATIENT PHYSICAL THERAPY TREATMENT NOTE ? ? ?Patient Name: Melody Rodriguez ?MRN: [id number] ?DOB:03/18/1976, 46 y.o., female ?Today's Date: 01/30/2022 ? ?PCP: Patient, [name] ?REFERRING PROVIDER: Wilber Bihari Cornett* ? ?END OF SESSION:  ? PT End of Session - 01/30/22 1191   ? ? Visit Number 8   ? Number of Visits 17   ? Date for PT Re-Evaluation 02/10/22   ? Authorization Type [employer] Medicaid - needs auth after 12 visits   ? Authorization Time Period 12/14/2021-02/10/2022   ? Authorization - Visit Number 8   ? Authorization - Number of Visits 17   ? Progress Note Due on Visit 10   ? PT Start Time [phone]   ? PT Stop Time 1010   ? PT Time Calculation (min) 39 min   ? Activity Tolerance Patient tolerated treatment well;Patient limited by pain   ? Behavior During Therapy [facility] for tasks assessed/performed   ? ?  ?  ? ?  ? ? ?Past Medical History:  ?Diagnosis Date  ? Breast cancer ([place])   ? Diabetes mellitus without complication ([place])   ? Family history of colon cancer   ? History of radiation therapy   ? right breast/scv  05/10/21-07/01/21  Dr Gery Pray  ? ?Past Surgical History:  ?Procedure Laterality Date  ? BREAST BIOPSY Right 02/18/2021  ? Procedure: EVACUATION HEMATOMA RIGHT AXILLA;  Surgeon: [name], MD;  Location: [place];  Service: General;  Laterality: Right;  ? BREAST CYST EXCISION Right   ? Patient does not recall (2014 or 2015)  ? BREAST LUMPECTOMY WITH RADIOACTIVE SEED AND SENTINEL LYMPH NODE BIOPSY Right 02/07/2021  ? Procedure: RIGHT BREAST LUMPECTOMY WITH RADIOACTIVE SEED AND SEED TARGETED LYMPH NODE BIOPSY AND SENTINEL LYMPH NODE BIOPSY;  Surgeon: [name], MD;  Location: [place];  Service: General;  Laterality: Right;  ? CESAREAN SECTION    ? IR IMAGING GUIDED PORT INSERTION  09/15/2020  ? IR REMOVAL TUN CV CATH W/O FL  09/15/2020  ? NODE DISSECTION Right 03/28/2021  ? Procedure: RIGHT AXILLARY LYMPH NODE DISSECTION;  Surgeon:  [name], MD;  Location: [place];  Service: General;  Laterality: Right;  ? PORT-A-CATH REMOVAL Left 02/07/2021  ? Procedure: REMOVAL PORT-A-CATH;  Surgeon: [name], MD;  Location: [place];  Service: General;  Laterality: Left;  ? THYROIDECTOMY, PARTIAL    ? ?Patient Active Problem List  ? Diagnosis Date Noted  ? Type 2 diabetes mellitus ([place]) 01/24/2022  ? Pap smear for cervical cancer screening 08/19/2021  ? Amenorrhea, secondary 03/02/2021  ? Genetic testing 08/11/2020  ? Iron deficiency anemia due to chronic blood loss 08/04/2020  ? Family history of colon cancer   ? Malignant neoplasm of upper-outer quadrant of right breast in female, estrogen receptor positive ([place]) 07/28/2020  ? ? ?REFERRING DIAG: M79.601 (ICD-10-CM) - Pain of right upper extremity  ? ?THERAPY DIAG:  ?Stiffness of right shoulder, not elsewhere classified ? ?Acute pain of right shoulder ? ?Abnormal posture ? ?PERTINENT HISTORY: Patient was diagnosed on 07/13/2020 with right grade 2 invasive ductal carcinoma breast cancer. It is ER/PR positive and HER2 negative with a Ki67 of 20%. Patient underwent neoadjuvant chemotherapy from 09/01/2020 - 01/14/2021. She had a right lumpectomy and sentinel node biopsy (1/1 nodes positive) on 02/07/2021. Hematoma evacuation on 02/18/2021. Axillary dissection on 03/28/2021 with 3/7 positive nodes. Iron deficiency. Radiation  05/10/2021-06/2021  Diagnosis of Type 2 diabetes in Nov. 2022.  Pt has had 2 steroid shots in right shoulder with only about 2 hours of relief.  She was advised not to have any more steroid shots due to her Diabetes  ? ?PRECAUTIONS: None ? ?SUBJECTIVE: Pt reports no new complaints, pt reports still having pain. ? ?PAIN:  ?Are you having pain? Yes: NPRS scale: 7/10 ?Pain location: R shoulder ?Pain description: burning ?Aggravating factors: movement ?Relieving factors: unknown at this time ? ?PATIENT GOALS:  To have better use of right  arm. ? ? ?OBJECTIVE:  ?  ?DIAGNOSTIC FINDINGS:  ?09/23/2021 breast ultrasound:  Overall findings to the right breast including skin thickening as ?well as parenchymal and trabecular thickening are favored to ?represent post lumpectomy and radiation changes. ?  ?PATIENT SURVEYS:  ?FOTO 42% at initial evaluation (projected 48% by visit 11) ?  ?COGNITION: ?          Overall cognitive status: Within functional limits for tasks assessed ?                                 ?SENSATION: ?Light touch: Impaired  on right side ?  ?POSTURE: ?Rounded shoulders, forward head ?  ?UPPER EXTREMITY ROM:  ?  ?Active ROM Right ?12/14/2021 Right ?01/11/2022 Left ?12/14/2021  ?Shoulder flexion 82 106 145  ?Shoulder extension 22  65  ?Shoulder abduction 83 86 140  ?Shoulder adduction       ?Shoulder internal rotation       ?Shoulder external rotation       ?Elbow flexion       ?Elbow extension       ?Wrist flexion       ?Wrist extension       ?Wrist ulnar deviation       ?Wrist radial deviation       ?Wrist pronation       ?Wrist supination       ?(Blank rows = not tested) ?  ?UPPER EXTREMITY MMT: ?  ?MMT Right ?12/14/2021 Right ?01/18/2022 Left ?12/14/2021  ?Shoulder flexion 2 2+ 5  ?Shoulder extension 2  5  ?Shoulder abduction 2  5  ?Shoulder adduction       ?Shoulder internal rotation       ?Shoulder external rotation       ?Middle trapezius       ?Lower trapezius       ?Elbow flexion 3 3+ 5  ?Elbow extension 3 3+ 5  ?Wrist flexion       ?Wrist extension       ?Wrist ulnar deviation       ?Wrist radial deviation       ?Wrist pronation       ?Wrist supination       ?Grip strength (lbs) 19 22 60  ?(Blank rows = not tested) ?  ?SHOULDER SPECIAL TESTS: ?           Impingement tests: Hawkins/Kennedy impingement test: positive on right ?           Rotator cuff assessment: Empty can test: positive on right ?            ?PALPATION:  ?Edema noted with sleeve in place.  Tender to palpation over shoulder region. ?            ?TODAY'S TREATMENT:   ? ?01/30/2022: ?Pulleys flexion and abduction x3 min each ?Seated bicep curls, shoulder flexion.  1# 2x10 bilat with cuing for posture and  to slow down ?Seated cervical rotation and extension.  X20 bilat ?Reaching to first shelf of cabinet 2x10 R shoulder ?Finger ladder for flexion and scaption x10 each ?Supine with 1# on cane:  AA/ROM shoulder flexion, chest press.  x10 bilat ? ?01/25/2022: ?Pulleys flexion and abduction x3 min each ?Seated bicep curls, shoulder flexion.  1# 2x10 bilat with cuing for posture and to slow down ?Seated cervical rotation and extension.  X20 bilat ?Reaching 1# weight and placing it on stool in front of her 2x10 reps RUE ?Green pball rollout for shoulder flexion  2x10  ? ?01/23/2022: ?Pulleys flexion and abduction x3 min each ?Seated bicep curls, shoulder flexion.  1# 2x10 bilat with cuing for posture and to slow down ?Seated cervical rotation and extension.  X20 bilat ?Reaching 1# weight and placing it on stool in front of her 2x10 reps RUE ?Green pball rollout for shoulder flexion and shoulder scaption 2x10 each ?Supine with 1# on cane:  AA/ROM shoulder flexion, chest press.  2x10 bilat ? ?  ?PATIENT EDUCATION: ?Education details: HEP and education on Dry Needling ?Person educated: Patient ?Education method: Explanation and Handouts ?Education comprehension: verbalized understanding ?  ?  ?HOME EXERCISE PROGRAM: ?Access Code: [email or web address] ?URL: [email or web address] ?Date: 12/14/2021 ?Prepared by: Juel Burrow ?  ?Exercises ?- Supine Shoulder Protraction with Dowel  - 2 x daily - 7 x weekly - 1 sets - 10 reps ?- Supine Shoulder Horizontal Abduction Adduction AAROM with Dowel  - 2 x daily - 7 x weekly - 1 sets - 10 reps ?- Supine Shoulder Circles with Dowel Clockwise  - 2 x daily - 7 x weekly - 1 sets - 10 reps ?- Supine Shoulder Flexion Extension AAROM with Dowel  - 1 x daily - 7 x weekly - 1 sets - 10 reps - 5 hold ?- Shoulder Flexion Wall Slide with Towel  - 1 x daily - 7 x  weekly - 2 sets - 10 reps ?  ?ASSESSMENT: ?  ?CLINICAL IMPRESSION: ?Ms How presents to PT with continued complaints of R shoulder limiting her.  Pt able to achieve 21 on finger ladder for flexion and 16 for scaption with ri

## 2022-01-31 ENCOUNTER — Encounter: Payer: Self-pay | Admitting: Adult Health

## 2022-02-01 ENCOUNTER — Telehealth: Payer: Self-pay | Admitting: *Deleted

## 2022-02-01 ENCOUNTER — Ambulatory Visit: Payer: Medicaid Other | Admitting: Rehabilitative and Restorative Service Providers"

## 2022-02-01 NOTE — Telephone Encounter (Signed)
See portal message patient sent 01/31/2022.  ? ?Connected with LENNYN GANGE [phone]) to advise she discuss next steps with [facility].  This nurse unable to provide (Mining engineer) independent Optometrist for disability or workers compensation as C50.411, Z11.0 did not occur during course of employment.     ? ?"[place] is for car payments.  Have not been on anyone's payroll since 2019.  Currently unable to work; unable to raise my arm since my second surgery.  Have an autistic, 18 year old son I am not able to leave home alone.  I clean, cook for him and manage my household with no family to help.  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  Arm remains swollen even though I wear my sleeve daily.  I drink two 33.3 oz water bottles daily.  Some trouble walking because feet also swell after just thirty minutes of sitting.  Spend a lot of time lying down.  I am going to loose my transportation.  I am a type two diabetic with blood sugars readings of 200 or more since I started treatment.  I feel like my entire body is falling apart.  I did not ask for any of this.  People with Medicaid do not receive care when needed.  No one is going to hire someone with limitations and I do not perform work half done; I work 100 %." ? ?Reviewed preventive management guidelines of lymphedema and foot swelling.  Possibility of lymphedema swelling, and pain are common adverse reactions of surgery.  Preventive management and exercises are important to follow daily.  "I had three surgeries.  Lymphedema is not the problem, something went wrong with surgery or radiation.  I am now being referred to the Pain Clinic."   ?Advised connecting with [facility].  "I have already received financial assistance.  Unable to receive more because I am not receiving active treatment"   ?Discussion progressed to job alternatives not using arms with frozen shoulder [facility]).  "I am not applying for a job I am unable to do." ?Remain active yet avoid jerking activities to prevent progression of frozen shoulder.  "You don't understand I have pain when I move or don't move.  Applied for [employer] total disability last year.  Being unable to raise or straighten my dominant arm is a disability."   ?ADA diet guidelines for optimal blood glucose management may improve or reverse frozen shoulder, avoid salt.  "I Microwave food.  Have met with a dietician and know about carbohydrates.a  My mother is a dietician, I know what I am supposed to eat."   ?"This is stressful me.  It is what it is if I loose my transportation.  I know you are doing your job but this is stressful." ?Apologized; this nurse trying to assist not increase cortisol stress levels.  Positive thinking, trying above alternatives may lead to regaining limited or full use of dominant, right arm over time.        ?

## 2022-02-06 ENCOUNTER — Ambulatory Visit: Payer: Medicaid Other

## 2022-02-06 ENCOUNTER — Encounter: Payer: Self-pay | Admitting: Adult Health

## 2022-02-06 ENCOUNTER — Other Ambulatory Visit: Payer: Self-pay

## 2022-02-06 VITALS — Wt 222.5 lb

## 2022-02-06 DIAGNOSIS — M25511 Pain in right shoulder: Secondary | ICD-10-CM

## 2022-02-06 DIAGNOSIS — M25611 Stiffness of right shoulder, not elsewhere classified: Secondary | ICD-10-CM

## 2022-02-06 DIAGNOSIS — Z483 Aftercare following surgery for neoplasm: Secondary | ICD-10-CM

## 2022-02-06 DIAGNOSIS — Z170 Estrogen receptor positive status [ER+]: Secondary | ICD-10-CM

## 2022-02-06 DIAGNOSIS — R293 Abnormal posture: Secondary | ICD-10-CM

## 2022-02-06 NOTE — Telephone Encounter (Signed)
Late entry for 02/02/2022. Connected with Financial advocate regarding this nurse previous communications with Ernesto Rutherford.  Notified of this nurse advice to connect with financial advocate or social worker about financial distress needs.

## 2022-02-06 NOTE — Therapy (Signed)
OUTPATIENT PHYSICAL THERAPY TREATMENT NOTE   Patient Name: Melody Rodriguez MRN: [id number] DOB:[birth year], 46 y.o., female Today's Date: 02/06/2022  PCP: Patient, [name] REFERRING PROVIDER: Wilber Bihari Cornett*  END OF SESSION:   PT End of Session - 02/06/22 1013     Visit Number 9    Date for PT Re-Evaluation 02/10/22    Authorization Type [employer] - needs auth after 12 visits    Authorization - Visit Number 9    Progress Note Due on Visit 10    PT Start Time 0931    PT Stop Time 1014    PT Time Calculation (min) 43 min    Activity Tolerance Patient tolerated treatment well    Behavior During Therapy [facility] for tasks assessed/performed              Past Medical History:  Diagnosis Date   Breast cancer ([place])    Diabetes mellitus without complication ([place])    Family history of colon cancer    History of radiation therapy    right breast/scv  05/10/21-07/01/21  Dr Gery Pray   Past Surgical History:  Procedure Laterality Date   BREAST BIOPSY Right 02/18/2021   Procedure: EVACUATION HEMATOMA RIGHT AXILLA;  Surgeon: [name], MD;  Location: [place];  Service: General;  Laterality: Right;   BREAST CYST EXCISION Right    Patient does not recall (2014 or 2015)   BREAST LUMPECTOMY WITH RADIOACTIVE SEED AND SENTINEL LYMPH NODE BIOPSY Right 02/07/2021   Procedure: RIGHT BREAST LUMPECTOMY WITH RADIOACTIVE SEED AND SEED TARGETED LYMPH NODE BIOPSY AND SENTINEL LYMPH NODE BIOPSY;  Surgeon: [name], MD;  Location: [place];  Service: General;  Laterality: Right;   CESAREAN SECTION     IR IMAGING GUIDED PORT INSERTION  09/15/2020   IR REMOVAL TUN CV CATH W/O FL  09/15/2020   NODE DISSECTION Right 03/28/2021   Procedure: RIGHT AXILLARY LYMPH NODE DISSECTION;  Surgeon: [name], MD;  Location: [place];  Service: General;  Laterality: Right;   PORT-A-CATH REMOVAL Left 02/07/2021    Procedure: REMOVAL PORT-A-CATH;  Surgeon: [name], MD;  Location: [place];  Service: General;  Laterality: Left;   THYROIDECTOMY, PARTIAL     Patient Active Problem List   Diagnosis Date Noted   Type 2 diabetes mellitus ([place]) 01/24/2022   Pap smear for cervical cancer screening 08/19/2021   Amenorrhea, secondary 03/02/2021   Genetic testing 08/11/2020   Iron deficiency anemia due to chronic blood loss 08/04/2020   Family history of colon cancer    Malignant neoplasm of upper-outer quadrant of right breast in female, estrogen receptor positive ([place]) 07/28/2020    REFERRING DIAG: M79.601 (ICD-10-CM) - Pain of right upper extremity   THERAPY DIAG:  Stiffness of right shoulder, not elsewhere classified  Acute pain of right shoulder  Abnormal posture  Aftercare following surgery for neoplasm  PERTINENT HISTORY: Patient was diagnosed on 07/13/2020 with right grade 2 invasive ductal carcinoma breast cancer. It is ER/PR positive and HER2 negative with a Ki67 of 20%. Patient underwent neoadjuvant chemotherapy from 09/01/2020 - 01/14/2021. She had a right lumpectomy and sentinel node biopsy (1/1 nodes positive) on 02/07/2021. Hematoma evacuation on 02/18/2021. Axillary dissection on 03/28/2021 with 3/7 positive nodes. Iron deficiency. Radiation  05/10/2021-06/2021  Diagnosis of Type 2 diabetes in Nov. 2022.  Pt has had 2 steroid shots in right shoulder with only about 2 hours of relief.  She was  advised not to have any more steroid shots due to her Diabetes   PRECAUTIONS: None  SUBJECTIVE: I'm about the same.  I am in pain all the time.    PAIN:  Are you having pain? Yes: NPRS scale: 7/10 Pain location: R shoulder Pain description: burning Aggravating factors: movement Relieving factors: unknown at this time  PATIENT GOALS:  To have better use of right arm.   OBJECTIVE:    DIAGNOSTIC FINDINGS:  09/23/2021 breast ultrasound:  Overall findings to the right breast  including skin thickening as well as parenchymal and trabecular thickening are favored to represent post lumpectomy and radiation changes.   PATIENT SURVEYS:  FOTO 42% at initial evaluation (projected 48% by visit 11)   COGNITION:           Overall cognitive status: Within functional limits for tasks assessed                                  SENSATION: Light touch: Impaired  on right side   POSTURE: Rounded shoulders, forward head   UPPER EXTREMITY ROM:    Active ROM Right 12/14/2021 Right 01/11/2022 Left 12/14/2021  Shoulder flexion 82 106 145  Shoulder extension 22  65  Shoulder abduction 83 86 140  Shoulder adduction       Shoulder internal rotation       Shoulder external rotation       Elbow flexion       Elbow extension       Wrist flexion       Wrist extension       Wrist ulnar deviation       Wrist radial deviation       Wrist pronation       Wrist supination       (Blank rows = not tested)   UPPER EXTREMITY MMT:   MMT Right 12/14/2021 Right 01/18/2022 Left 12/14/2021  Shoulder flexion 2 2+ 5  Shoulder extension 2  5  Shoulder abduction 2  5  Shoulder adduction       Shoulder internal rotation       Shoulder external rotation       Middle trapezius       Lower trapezius       Elbow flexion 3 3+ 5  Elbow extension 3 3+ 5  Wrist flexion       Wrist extension       Wrist ulnar deviation       Wrist radial deviation       Wrist pronation       Wrist supination       Grip strength (lbs) 19 22 60  (Blank rows = not tested)   SHOULDER SPECIAL TESTS:            Impingement tests: Hawkins/Kennedy impingement test: positive on right            Rotator cuff assessment: Empty can test: positive on right             PALPATION:  Edema noted with sleeve in place.  Tender to palpation over shoulder region.             TODAY'S TREATMENT:  02/06/2022: Pulleys flexion and abduction x3 min each Seated bicep curls, shoulder flexion.  1# 2x10 bil.  Partial ROM on  the Rt due to pain Seated cervical rotation and extension.  x20 bilat Reaching to first shelf  of cabinet 2x10 Rt shoulder Finger ladder for flexion and scaption x10 each Supine with 1# on cane:  AA/ROM shoulder flexion, chest press.  x10 bilat  01/30/2022: Pulleys flexion and abduction x3 min each Seated bicep curls, shoulder flexion.  1# 2x10 bilat with cuing for posture and to slow down Seated cervical rotation and extension.  X20 bilat Reaching to first shelf of cabinet 2x10 R shoulder Finger ladder for flexion and scaption x10 each Supine with 1# on cane:  AA/ROM shoulder flexion, chest press.  x10 bilat  01/25/2022: Pulleys flexion and abduction x3 min each Seated bicep curls, shoulder flexion.  1# 2x10 bilat with cuing for posture and to slow down Seated cervical rotation and extension.  X20 bilat Reaching 1# weight and placing it on stool in front of her 2x10 reps RUE Green pball rollout for shoulder flexion  2x10   PATIENT EDUCATION: Education details:Neuroscience of pain, meditation for pain Person educated: Patient Education method: Theatre stage manager Education comprehension: verbalized understanding     HOME EXERCISE PROGRAM: Access Code: [email or web address] URL: [email or web address] Date: 12/14/2021 Prepared by: Shelby Dubin Menke   Exercises - Supine Shoulder Protraction with Dowel  - 2 x daily - 7 x weekly - 1 sets - 10 reps - Supine Shoulder Horizontal Abduction Adduction AAROM with Dowel  - 2 x daily - 7 x weekly - 1 sets - 10 reps - Supine Shoulder Circles with Dowel Clockwise  - 2 x daily - 7 x weekly - 1 sets - 10 reps - Supine Shoulder Flexion Extension AAROM with Dowel  - 1 x daily - 7 x weekly - 1 sets - 10 reps - 5 hold - Shoulder Flexion Wall Slide with Towel  - 1 x daily - 7 x weekly - 2 sets - 10 reps   ASSESSMENT:   CLINICAL IMPRESSION: Pt reports that she is in pain all the time and it doesn't matter what she does.  Pt reports compliance in  HEP.  Pt was able to complete all exercises in the clinic today with the most discomfort with overhead motion.  She required a rest break during scaption with pulleys.  PT spent time with pt discussing the neuroscience of pain, chronic pain in absence of tissue damage, and impact of external stressors on pain.  Pt reports that she already meditates and that she doesn't feel that anything is going to help her pain until they find the source. PT will perform reassessment next session.  Pt continues to require skilled PT to progress towards goal related activities.     OBJECTIVE IMPAIRMENTS decreased ROM, decreased strength, increased fascial restrictions, increased muscle spasms, impaired flexibility, impaired UE functional use, postural dysfunction, and pain.    ACTIVITY LIMITATIONS community activity, driving, occupation, and shopping.    PERSONAL FACTORS 1-2 comorbidities: breast cancer, DM  are also affecting patient's functional outcome.      REHAB POTENTIAL: Good   CLINICAL DECISION MAKING: Evolving/moderate complexity   EVALUATION COMPLEXITY: Moderate     GOALS: Goals reviewed with patient? Yes   SHORT TERM GOALS: Target date: 01/04/2022   Pt will be independent with initial HEP. Baseline: Goal status: Goal Met   2.  Pt will increase R shoulder flexion and abduction to at least 90 degrees. Baseline: R flexion 82, R abduction 83 degrees Goal status: ONGOING, met for flexion on 01/11/22     LONG TERM GOALS: Target date: 02/08/2022   Pt will be independent with advanced HEP. Baseline:  Goal status: ONGOING  2.  Pt will increase FOTO to at least 48% to demonstrate increased functional mobility. Baseline: 42% at initial eval Goal status: INITIAL   3.  Pt will increase R shoulder flexion and abduction to at least 110 degrees to allow her to reach into overhead cabinets. Baseline:  Goal status: INITIAL   4.  Pt will increase R shoulder strength to at least 4/5 to allow her to  lift items around her home. Baseline: 2/5 Goal status: INITIAL     PLAN: PT FREQUENCY: 2x/week   PT DURATION: 8 weeks   PLANNED INTERVENTIONS: Therapeutic exercises, Therapeutic activity, Neuromuscular re-education, Balance training, Gait training, Patient/Family education, Joint manipulation, Joint mobilization, Aquatic Therapy, Dry Needling, Spinal manipulation, Spinal mobilization, Cryotherapy, Moist heat, scar mobilization, Taping, Ionotophoresis 34m/ml Dexamethasone, and Manual therapy   PLAN FOR NEXT SESSION: ERO next, pt is limited by pain    KSigurd Sos PT 02/06/22 10:14 AM   [facility] [address] [place] [address]Phone # [phone] [phone]

## 2022-02-06 NOTE — Therapy (Signed)
OUTPATIENT PHYSICAL THERAPY SOZO SCREENING NOTE   Patient Name: Melody Rodriguez MRN: [id number] DOB:[birth year], 46 y.o., female Today's Date: 02/06/2022  PCP: Patient, [name] REFERRING PROVIDER: Deanne Coffer*   PT End of Session - 02/06/22 0856     Visit Number 8   # unchanged due to screen only   PT Start Time 0855    PT Stop Time 0900    PT Time Calculation (min) 5 min    Activity Tolerance Patient tolerated treatment well    Behavior During Therapy [facility] for tasks assessed/performed             Past Medical History:  Diagnosis Date   Breast cancer ([place])    Diabetes mellitus without complication ([place])    Family history of colon cancer    History of radiation therapy    right breast/scv  05/10/21-07/01/21  Dr Gery Pray   Past Surgical History:  Procedure Laterality Date   BREAST BIOPSY Right 02/18/2021   Procedure: EVACUATION HEMATOMA RIGHT AXILLA;  Surgeon: [name], MD;  Location: [place];  Service: General;  Laterality: Right;   BREAST CYST EXCISION Right    Patient does not recall (2014 or 2015)   BREAST LUMPECTOMY WITH RADIOACTIVE SEED AND SENTINEL LYMPH NODE BIOPSY Right 02/07/2021   Procedure: RIGHT BREAST LUMPECTOMY WITH RADIOACTIVE SEED AND SEED TARGETED LYMPH NODE BIOPSY AND SENTINEL LYMPH NODE BIOPSY;  Surgeon: [name], MD;  Location: [place];  Service: General;  Laterality: Right;   CESAREAN SECTION     IR IMAGING GUIDED PORT INSERTION  09/15/2020   IR REMOVAL TUN CV CATH W/O FL  09/15/2020   NODE DISSECTION Right 03/28/2021   Procedure: RIGHT AXILLARY LYMPH NODE DISSECTION;  Surgeon: [name], MD;  Location: [place];  Service: General;  Laterality: Right;   PORT-A-CATH REMOVAL Left 02/07/2021   Procedure: REMOVAL PORT-A-CATH;  Surgeon: [name], MD;  Location: [place];  Service: General;  Laterality: Left;   THYROIDECTOMY,  PARTIAL     Patient Active Problem List   Diagnosis Date Noted   Type 2 diabetes mellitus ([place]) 01/24/2022   Pap smear for cervical cancer screening 08/19/2021   Amenorrhea, secondary 03/02/2021   Genetic testing 08/11/2020   Iron deficiency anemia due to chronic blood loss 08/04/2020   Family history of colon cancer    Malignant neoplasm of upper-outer quadrant of right breast in female, estrogen receptor positive ([place]) 07/28/2020    REFERRING DIAG: right breast cancer at risk for lymphedema  THERAPY DIAG:  Aftercare following surgery for neoplasm  PERTINENT HISTORY: Patient was diagnosed on 07/13/2020 with right grade 2 invasive ductal carcinoma breast cancer. It is ER/PR positive and HER2 negative with a Ki67 of 20%. Patient underwent neoadjuvant chemotherapy from 09/01/2020 - 01/14/2021. She had a right lumpectomy and sentinel node biopsy (1/1 nodes positive) on 02/07/2021. Hematoma evacuation on 02/18/2021. Axillary dissection on 03/28/2021 with 3/7 positive nodes. Iron deficiency. Radiation  05/10/2021-06/2021  Diagnosis of Type 2 diabetes in Nov. 2022.  Pt has had 2 steroid shots in right shoulder with only about 2 hours of relief.  She was advised not to have any more steroid shots due to her Diabetes   PRECAUTIONS: right UE Lymphedema risk, None  SUBJECTIVE: Pt returns for her 3 month L-Dex screen.   PAIN:  Are you having pain? No  SOZO SCREENING: Patient was assessed today using the SOZO machine to determine the lymphedema  index score. This was compared to her baseline score. It was determined that she is within the recommended range when compared to her baseline and no further action is needed at this time. She will continue SOZO screenings. These are done every 3 months for 2 years post operatively followed by every 6 months for 2 years, and then annually.    Otelia Limes, PTA 02/06/2022, 8:58 AM

## 2022-02-07 ENCOUNTER — Encounter: Payer: Self-pay | Admitting: Physical Medicine & Rehabilitation

## 2022-02-08 ENCOUNTER — Ambulatory Visit: Payer: Medicaid Other | Admitting: Rehabilitative and Restorative Service Providers"

## 2022-02-08 ENCOUNTER — Encounter: Payer: Self-pay | Admitting: Rehabilitative and Restorative Service Providers"

## 2022-02-08 DIAGNOSIS — M25611 Stiffness of right shoulder, not elsewhere classified: Secondary | ICD-10-CM

## 2022-02-08 DIAGNOSIS — M25511 Pain in right shoulder: Secondary | ICD-10-CM

## 2022-02-08 DIAGNOSIS — R293 Abnormal posture: Secondary | ICD-10-CM

## 2022-02-08 NOTE — Therapy (Signed)
OUTPATIENT PHYSICAL THERAPY TREATMENT NOTE AND DISCHARGE SUMMARY   Patient Name: Melody Rodriguez MRN: [id number] DOB:[birth year], 46 y.o., female Today's Date: 02/08/2022  PCP: Patient, [name] REFERRING PROVIDER: Wilber Bihari Cornett*  END OF SESSION:   PT End of Session - 02/08/22 0935     Visit Number 10    Number of Visits 17    Date for PT Re-Evaluation 02/10/22    Authorization Type [employer] - needs auth after 12 visits    Authorization Time Period 12/14/2021-02/10/2022    Authorization - Visit Number 10    Authorization - Number of Visits 17    Progress Note Due on Visit 10    PT Start Time 0930    PT Stop Time 1010    PT Time Calculation (min) 40 min    Activity Tolerance Patient tolerated treatment well    Behavior During Therapy [facility] for tasks assessed/performed              Past Medical History:  Diagnosis Date   Breast cancer ([place])    Diabetes mellitus without complication ([place])    Family history of colon cancer    History of radiation therapy    right breast/scv  05/10/21-07/01/21  Dr Gery Pray   Past Surgical History:  Procedure Laterality Date   BREAST BIOPSY Right 02/18/2021   Procedure: EVACUATION HEMATOMA RIGHT AXILLA;  Surgeon: [name], MD;  Location: [place];  Service: General;  Laterality: Right;   BREAST CYST EXCISION Right    Patient does not recall (2014 or 2015)   BREAST LUMPECTOMY WITH RADIOACTIVE SEED AND SENTINEL LYMPH NODE BIOPSY Right 02/07/2021   Procedure: RIGHT BREAST LUMPECTOMY WITH RADIOACTIVE SEED AND SEED TARGETED LYMPH NODE BIOPSY AND SENTINEL LYMPH NODE BIOPSY;  Surgeon: [name], MD;  Location: [place];  Service: General;  Laterality: Right;   CESAREAN SECTION     IR IMAGING GUIDED PORT INSERTION  09/15/2020   IR REMOVAL TUN CV CATH W/O FL  09/15/2020   NODE DISSECTION Right 03/28/2021   Procedure: RIGHT AXILLARY LYMPH NODE DISSECTION;  Surgeon:  [name], MD;  Location: [place];  Service: General;  Laterality: Right;   PORT-A-CATH REMOVAL Left 02/07/2021   Procedure: REMOVAL PORT-A-CATH;  Surgeon: [name], MD;  Location: [place];  Service: General;  Laterality: Left;   THYROIDECTOMY, PARTIAL     Patient Active Problem List   Diagnosis Date Noted   Type 2 diabetes mellitus ([place]) 01/24/2022   Pap smear for cervical cancer screening 08/19/2021   Amenorrhea, secondary 03/02/2021   Genetic testing 08/11/2020   Iron deficiency anemia due to chronic blood loss 08/04/2020   Family history of colon cancer    Malignant neoplasm of upper-outer quadrant of right breast in female, estrogen receptor positive ([place]) 07/28/2020    REFERRING DIAG: M79.601 (ICD-10-CM) - Pain of right upper extremity   THERAPY DIAG:  Stiffness of right shoulder, not elsewhere classified  Acute pain of right shoulder  Abnormal posture  PERTINENT HISTORY: Patient was diagnosed on 07/13/2020 with right grade 2 invasive ductal carcinoma breast cancer. It is ER/PR positive and HER2 negative with a Ki67 of 20%. Patient underwent neoadjuvant chemotherapy from 09/01/2020 - 01/14/2021. She had a right lumpectomy and sentinel node biopsy (1/1 nodes positive) on 02/07/2021. Hematoma evacuation on 02/18/2021. Axillary dissection on 03/28/2021 with 3/7 positive nodes. Iron deficiency. Radiation  05/10/2021-06/2021  Diagnosis of Type 2 diabetes in Nov. 2022.  Pt has had 2 steroid shots in right shoulder with only about 2 hours of relief.  She was advised not to have any more steroid shots due to her Diabetes   PRECAUTIONS: None  SUBJECTIVE: I'm about the same.  I am in pain all the time.    PAIN:  Are you having pain? Yes: NPRS scale: 7/10 Pain location: R shoulder Pain description: burning Aggravating factors: movement Relieving factors: unknown at this time  PATIENT GOALS:  To have better use of right  arm.   OBJECTIVE:    DIAGNOSTIC FINDINGS:  09/23/2021 breast ultrasound:  Overall findings to the right breast including skin thickening as well as parenchymal and trabecular thickening are favored to represent post lumpectomy and radiation changes.   PATIENT SURVEYS:  FOTO 42% at initial evaluation (projected 48% by visit 11)  02/08/2022:  41%   COGNITION:           Overall cognitive status: Within functional limits for tasks assessed                                  SENSATION: Light touch: Impaired  on right side   POSTURE: Rounded shoulders, forward head   UPPER EXTREMITY ROM:    Active ROM Right 12/14/2021 Right 01/11/2022 Right 02/08/2022 Left 12/14/2021  Shoulder flexion 82 106 90 145  Shoulder extension 22   65  Shoulder abduction 83 86 84 140  Shoulder adduction        Shoulder internal rotation        Shoulder external rotation        Elbow flexion        Elbow extension        Wrist flexion        Wrist extension        Wrist ulnar deviation        Wrist radial deviation        Wrist pronation        Wrist supination        (Blank rows = not tested)   UPPER EXTREMITY MMT:   MMT Right 12/14/2021 Right 01/18/2022 Right  Left 12/14/2021  Shoulder flexion 2 2+ 2+ 5  Shoulder extension 2   5  Shoulder abduction 2   5  Shoulder adduction        Shoulder internal rotation        Shoulder external rotation        Middle trapezius        Lower trapezius        Elbow flexion 3 3+ 3+ 5  Elbow extension 3 3+ 3+ 5  Wrist flexion        Wrist extension        Wrist ulnar deviation        Wrist radial deviation        Wrist pronation        Wrist supination        Grip strength (lbs) _0 60  (Blank rows = not tested)   SHOULDER SPECIAL TESTS:            Impingement tests: Hawkins/Kennedy impingement test: positive on right            Rotator cuff assessment: Empty can test: positive on right             PALPATION:  Edema noted with sleeve in place.   Tender  to palpation over shoulder region.             TODAY'S TREATMENT:  02/08/2022: Pulleys flexion and abduction x3 min each FOTO, A/ROM, strength testing Grip strength with soft yellow putty squeezing x1 min Finger ladder for flexion and scaption x10 each  02/06/2022: Pulleys flexion and abduction x3 min each Seated bicep curls, shoulder flexion.  1# 2x10 bil.  Partial ROM on the Rt due to pain Seated cervical rotation and extension.  x20 bilat Reaching to first shelf of cabinet 2x10 Rt shoulder Finger ladder for flexion and scaption x10 each Supine with 1# on cane:  AA/ROM shoulder flexion, chest press.  x10 bilat  01/30/2022: Pulleys flexion and abduction x3 min each Seated bicep curls, shoulder flexion.  1# 2x10 bilat with cuing for posture and to slow down Seated cervical rotation and extension.  X20 bilat Reaching to first shelf of cabinet 2x10 R shoulder Finger ladder for flexion and scaption x10 each Supine with 1# on cane:  AA/ROM shoulder flexion, chest press.  x10 bilat   PATIENT EDUCATION: Education details:Neuroscience of pain, meditation for pain Person educated: Patient Education method: Explanation and Handouts Education comprehension: verbalized understanding     HOME EXERCISE PROGRAM: Access Code: [email or web address] URL: [email or web address] Date: 12/14/2021 Prepared by: Shelby Dubin Jubilee Vivero   Exercises - Supine Shoulder Protraction with Dowel  - 2 x daily - 7 x weekly - 1 sets - 10 reps - Supine Shoulder Horizontal Abduction Adduction AAROM with Dowel  - 2 x daily - 7 x weekly - 1 sets - 10 reps - Supine Shoulder Circles with Dowel Clockwise  - 2 x daily - 7 x weekly - 1 sets - 10 reps - Supine Shoulder Flexion Extension AAROM with Dowel  - 1 x daily - 7 x weekly - 1 sets - 10 reps - 5 hold - Shoulder Flexion Wall Slide with Towel  - 1 x daily - 7 x weekly - 2 sets - 10 reps   ASSESSMENT:   CLINICAL IMPRESSION: Ms Cocke presents to PT for skilled  rehabilitation.  Pt with FOTO score that has decreased one point since initial evaluation and stating that she is not having a good day.  Pt with increased R shoulder A/ROM and improved strength noted overall since initial evaluation, but overall continues to be limited by pain and having similar symptoms to initial evaluation.  Pt discharged at this time secondary to increased pain and recommended that pt follow up with a Pain Management doctor for further symptom management, as pt did not tolerate dry needling.  Pt verbalizes understanding and states that she has an appointment scheduled with a pain management MD next month.     OBJECTIVE IMPAIRMENTS decreased ROM, decreased strength, increased fascial restrictions, increased muscle spasms, impaired flexibility, impaired UE functional use, postural dysfunction, and pain.    ACTIVITY LIMITATIONS community activity, driving, occupation, and shopping.    PERSONAL FACTORS 1-2 comorbidities: breast cancer, DM  are also affecting patient's functional outcome.      REHAB POTENTIAL: Good   CLINICAL DECISION MAKING: Evolving/moderate complexity   EVALUATION COMPLEXITY: Moderate     GOALS: Goals reviewed with patient? Yes   SHORT TERM GOALS: Target date: 01/04/2022   Pt will be independent with initial HEP. Baseline: Goal status: Goal Met   2.  Pt will increase R shoulder flexion and abduction to at least 90 degrees. Baseline: R flexion 82, R abduction 83 degrees Goal status: ONGOING, met for flexion on 01/11/22  LONG TERM GOALS: Target date: 02/08/2022   Pt will be independent with advanced HEP. Baseline:  Goal status: Goal Met   2.  Pt will increase FOTO to at least 48% to demonstrate increased functional mobility. Baseline: 42% at initial eval Goal status: Not Met   3.  Pt will increase R shoulder flexion and abduction to at least 110 degrees to allow her to reach into overhead cabinets. Baseline:  Goal status: Not Met   4.   Pt will increase R shoulder strength to at least 4/5 to allow her to lift items around her home. Baseline: 2/5 Goal status: Not Met     PLAN: PT FREQUENCY: 2x/week   PT DURATION: 8 weeks   PLANNED INTERVENTIONS: Therapeutic exercises, Therapeutic activity, Neuromuscular re-education, Balance training, Gait training, Patient/Family education, Joint manipulation, Joint mobilization, Aquatic Therapy, Dry Needling, Spinal manipulation, Spinal mobilization, Cryotherapy, Moist heat, scar mobilization, Taping, Ionotophoresis 93m/ml Dexamethasone, and Manual therapy   PLAN FOR NEXT SESSION: Discharged from ortho PT on 02/08/2022.  Recommend following up with Pain Management MD.    SShelby Dubin PT 02/08/22 10:39 AM   [id number] SHarrington ParkGSan Cristobal [place] [address]Phone # [phone] [phone]

## 2022-02-16 ENCOUNTER — Other Ambulatory Visit (HOSPITAL_COMMUNITY): Payer: Self-pay

## 2022-02-17 ENCOUNTER — Other Ambulatory Visit (HOSPITAL_COMMUNITY): Payer: Self-pay

## 2022-02-17 MED ORDER — PIOGLITAZONE HCL 15 MG PO TABS
ORAL_TABLET | ORAL | 5 refills | Status: DC
  Filled 2022-02-17: qty 30, 30d supply, fill #0
  Filled 2022-03-13: qty 30, 30d supply, fill #1
  Filled 2022-04-17: qty 30, 30d supply, fill #2
  Filled 2022-05-15 – 2022-05-19 (×2): qty 15, 15d supply, fill #3

## 2022-02-17 MED ORDER — ATORVASTATIN CALCIUM 10 MG PO TABS
ORAL_TABLET | ORAL | 5 refills | Status: DC
  Filled 2022-02-17: qty 30, 30d supply, fill #0
  Filled 2022-03-13: qty 30, 30d supply, fill #1
  Filled 2022-04-17: qty 30, 30d supply, fill #2
  Filled 2022-05-15: qty 30, 30d supply, fill #3
  Filled 2022-06-27: qty 30, 30d supply, fill #4
  Filled 2022-08-07: qty 30, 30d supply, fill #5

## 2022-02-17 MED ORDER — HYDROXYZINE HCL 10 MG PO TABS
ORAL_TABLET | ORAL | 0 refills | Status: DC
  Filled 2022-02-17: qty 90, 30d supply, fill #0

## 2022-02-18 ENCOUNTER — Other Ambulatory Visit (HOSPITAL_COMMUNITY): Payer: Self-pay

## 2022-02-20 ENCOUNTER — Other Ambulatory Visit: Payer: Self-pay

## 2022-02-20 ENCOUNTER — Other Ambulatory Visit (HOSPITAL_COMMUNITY): Payer: Self-pay

## 2022-02-20 ENCOUNTER — Inpatient Hospital Stay: Payer: Medicaid Other | Attending: Hematology and Oncology

## 2022-02-20 ENCOUNTER — Encounter: Payer: Self-pay | Admitting: Gynecologic Oncology

## 2022-02-20 VITALS — BP 136/83 | HR 80 | Temp 98.9°F | Resp 18

## 2022-02-20 DIAGNOSIS — Z923 Personal history of irradiation: Secondary | ICD-10-CM | POA: Diagnosis not present

## 2022-02-20 DIAGNOSIS — Z7984 Long term (current) use of oral hypoglycemic drugs: Secondary | ICD-10-CM | POA: Diagnosis not present

## 2022-02-20 DIAGNOSIS — C50411 Malignant neoplasm of upper-outer quadrant of right female breast: Secondary | ICD-10-CM | POA: Insufficient documentation

## 2022-02-20 DIAGNOSIS — C773 Secondary and unspecified malignant neoplasm of axilla and upper limb lymph nodes: Secondary | ICD-10-CM | POA: Diagnosis present

## 2022-02-20 DIAGNOSIS — Z79899 Other long term (current) drug therapy: Secondary | ICD-10-CM | POA: Insufficient documentation

## 2022-02-20 DIAGNOSIS — Z170 Estrogen receptor positive status [ER+]: Secondary | ICD-10-CM | POA: Diagnosis not present

## 2022-02-20 DIAGNOSIS — Z5111 Encounter for antineoplastic chemotherapy: Secondary | ICD-10-CM | POA: Diagnosis present

## 2022-02-20 DIAGNOSIS — Z79811 Long term (current) use of aromatase inhibitors: Secondary | ICD-10-CM | POA: Diagnosis not present

## 2022-02-20 MED ORDER — GOSERELIN ACETATE 3.6 MG ~~LOC~~ IMPL
3.6000 mg | DRUG_IMPLANT | Freq: Once | SUBCUTANEOUS | Status: AC
  Administered 2022-02-20: 3.6 mg via SUBCUTANEOUS

## 2022-02-23 NOTE — Progress Notes (Unsigned)
GYNECOLOGIC ONCOLOGY NEW PATIENT CONSULTATION   Patient Name: Melody Rodriguez  Patient Age: 46 y.o. Date of Service: 02/24/2022 Referring Provider: Gardenia Phlegm, NP [place],  [place] [address]   Primary Care Provider: Patient, [name] Consulting Provider: Jeral Pinch, MD   Assessment/Plan:  Premenopausal patient with ER+ breast cancer interested in therapeutic BSO.  We discussed reasons for BSO in the setting of breast cancer including risk-reducing BSO (for those with increased baseline risk of ovarian cancer, such as those with BRCA1 or 2 mutations) and therapeutic BSO (for patients with ER+ cancers). The patient's genetic testing was negative for germline pathogenic mutation known to increase her risk of ovarian cancer, but she does have ER+ breast cancer. In this setting, we discussed that suppression of endogenous ovarian production of estrogen can be accomplished but medication (such as the Zoladex she is on) or by surgical excision. She has been on Zoladex, and given some side effects related to the injection, she is interested in considering therapeutic BSO.    Given that her average glucose is over 200 (including fasting per patient's report today), I discussed increased morbidity related to surgery.  My recommendation is that we work to improve her glycemic control and ideally get her glucose under 180-200 before proceeding with surgery.  We will send a clearance form to her primary care provider.  Ideally, she would make some diet changes and potentially some medication changes to help her improve glycemic control.  I also encouraged her to keep a close log of her glucose and also her food intake.  It sounds like she is overall making good food choices, but she often does not eat regular snacks or meals during the day.  I encouraged her to add some additional snacks so that she is not fasting for long periods of time.  We will plan to have a  phone visit check-in in 6 weeks to assess her blood sugars.  If her glycemic control has improved, we can get her scheduled for a preoperative visit at that time and pick a date for minimal invasive BSO.  A copy of this note was sent to the patient's referring provider.   50 minutes of total time was spent for this patient encounter, including preparation, face-to-face counseling with the patient and coordination of care, and documentation of the encounter.   Jeral Pinch, MD  Division of Gynecologic Oncology  Department of Obstetrics and Gynecology  [facility]  ___________________________________________  Chief Complaint: Chief Complaint  Patient presents with   Estrogen receptor positive    History of Present Illness:  Melody Rodriguez is a 46 y.o. y.o. female who is seen in consultation at the request of Deanne Coffer* for an evaluation of therapeutic bilateral salpingo-oophorectomy in the setting of estrogen receptor breast cancer.  Patient has estrogen receptor positive breast cancer.  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  She was treated with neoadjuvant chemotherapy, lumpectomy and lymph node dissection, radiation, and ultimately has been on antiestrogen therapy since November 2022.  She is currently on Zoladex and anastrozole.  She notes overall doing well.  She had some significant side effects related to letrozole and was recently transition to anastrozole.  In terms of the Zoladex, she thinks that she has had some weight gain as well as worsening of glycemic control since being on  the medication.  She also notes that the injections are irritating to her skin and subcutaneous tissue and that she often will bleed after the injection.  She endorses regular bowel and bladder function.  She has a history of heavy painful menses.  She has  had 2 cycles since starting treatment and then has been amenorrheic. She saw Dr. Rip Harbour in 02/2021; she was amenorrheic at that time. Although limited, pelvic ultrasound showed normal size uterus, ovaries not visualized.  Treatment History: Oncology History  Malignant neoplasm of upper-outer quadrant of right breast in female, estrogen receptor positive ([place])  07/28/2020 Initial Diagnosis   Patient palpated a right breast mass for 1-2 years. Mammogram showed a 2.2cm mass at the 11 o'clock position with surrounding calcifications, 6.4cm in total extent, and up to 5 abnormal right axillary lymph nodes. 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    08/05/2020 Miscellaneous   MammaPrint: High risk luminal type B   08/11/2020 Genetic Testing   Negative genetic testing: no pathogenic variants detected in [facility] Breast Cancer STAT Panel or Common Hereditary Cancers panel. The report dates are August 11, 2020 and August 19, 2020, respectively. Two variants of uncertain signficance were detected - one in the CTNNA1 gene called c.86del and the second in the MLH1 gene called c.808A>G.   UPDATE:  The MLH1 c.808A>G VUS was reclassified to "Likely Benign" on 02/19/2021. The change in variant classification was made as a result of re-review of the evidence in light of new variant interpretation guidelines and/or new information.   The STAT Breast cancer panel offered by [facility] includes sequencing and rearrangement analysis for the following 9 genes:  ATM, BRCA1, BRCA2, CDH1, CHEK2, PALB2, PTEN, STK11 and TP53.  The Common Hereditary Cancers Panel offered by [facility] includes sequencing and/or deletion duplication testing of the following 48 genes: APC, ATM, AXIN2, BARD1, BMPR1A, BRCA1, BRCA2, BRIP1, CDH1, CDK4, CDKN2A (p14ARF), CDKN2A (p16INK4a), CHEK2, CTNNA1, DICER1, EPCAM  (Deletion/duplication testing only), GREM1 (promoter region deletion/duplication testing only), KIT, MEN1, MLH1, MSH2, MSH3, MSH6, MUTYH, NBN, NF1, NTHL1, PALB2, PDGFRA, PMS2, POLD1, POLE, PTEN, RAD50, RAD51C, RAD51D, RNF43, SDHB, SDHC, SDHD, SMAD4, SMARCA4. STK11, TP53, TSC1, TSC2, and VHL.  The following genes were evaluated for sequence changes only: SDHA and HOXB13 c.251G>A variant only.    09/01/2020 - 01/11/2021 Neo-Adjuvant Chemotherapy   Adriamycin and Cytoxan x4 09/01/2020-10/12/2020 Weekly Taxol x 12  10/26/2020-01/11/2021(dose reduced d/t AE)   02/07/2021 Surgery   Right lumpectomy [name]): invasive and in situ ductal carcinoma, 2.8cm, clear margins, with metastatic carcinoma in 1/1 right axillary lymph nodes.   03/28/2021 Surgery   Axillary lymph node dissection: 3/7 lymph nodes +   05/10/2021 - 07/01/2021 Radiation Therapy   Site Technique Total Dose (Gy) Dose per Fx (Gy) Completed Fx Beam Energies  Breast, Right: Breast_Rt 3D 50.4/50.4 1.8 28/28 10X  Breast, Right: Breast_Rt_Bst specialPort 12/12 2 6/6 15E  Sclav-RT: SCV_Rt 3D 50.4/50.4 1.8 28/28      07/2021 -  Anti-estrogen oral therapy   Zoladex + Letrozole + Verzenio    PAST MEDICAL HISTORY:  Past Medical History:  Diagnosis Date   Breast cancer ([place])    Diabetes mellitus without complication ([place])    glipizide and actps; cbgs 200s fasting   Family history of colon cancer    History of goiter    History of radiation therapy  right breast/scv  05/10/21-07/01/21  Dr Gery Pray   Hyperlipidemia      PAST SURGICAL HISTORY:  Past Surgical History:  Procedure Laterality Date   BREAST BIOPSY Right 02/18/2021   Procedure: EVACUATION HEMATOMA RIGHT AXILLA;  Surgeon: [name], MD;  Location: [place];  Service: General;  Laterality: Right;   BREAST CYST EXCISION Right    Patient does not recall (2014 or 2015)   BREAST LUMPECTOMY WITH RADIOACTIVE SEED AND SENTINEL LYMPH NODE BIOPSY Right  02/07/2021   Procedure: RIGHT BREAST LUMPECTOMY WITH RADIOACTIVE SEED AND SEED TARGETED LYMPH NODE BIOPSY AND SENTINEL LYMPH NODE BIOPSY;  Surgeon: [name], MD;  Location: [place];  Service: General;  Laterality: Right;   CESAREAN SECTION     x2   IR IMAGING GUIDED PORT INSERTION  09/15/2020   IR REMOVAL TUN CV CATH W/O FL  09/15/2020   NODE DISSECTION Right 03/28/2021   Procedure: RIGHT AXILLARY LYMPH NODE DISSECTION;  Surgeon: [name], MD;  Location: [place];  Service: General;  Laterality: Right;   PORT-A-CATH REMOVAL Left 02/07/2021   Procedure: REMOVAL PORT-A-CATH;  Surgeon: [name], MD;  Location: [place];  Service: General;  Laterality: Left;   THYROIDECTOMY, PARTIAL      OB/GYN HISTORY:  OB History  Gravida Para Term Preterm AB Living  2         2  SAB IAB Ectopic Multiple Live Births          2    # Outcome Date GA Lbr Len/2nd Weight Sex Delivery Anes PTL Lv  2 Gravida 03/11/10     CS-Unspec   LIV  1 Gravida 06/08/00     CS-Unspec   LIV    No LMP recorded. (Menstrual status: Chemotherapy).  Age at menarche: 30 Age at menopause: Not applicable Hx of HRT: Not applicable Hx of STDs: Denies Last pap: 08/2021 - negative, HR HPV negative History of abnormal pap smears: Thinks that she had an abnormal Pap smear in [place] a number of years ago.  Does not remember exactly what was done, but thinks she had a biopsy that came back normal.  SCREENING STUDIES:  Last mammogram: 2023  Last colonoscopy: Has not had Bone density: 2022  MEDICATIONS: Outpatient Encounter Medications as of 02/24/2022  Medication Sig   ACCU-CHEK GUIDE test strip USE TO MONITOR BLOOD GLUCOSE 3 TIME(S) DAILY   Accu-Chek Softclix Lancets lancets 3 (three) times daily.   acetaminophen (TYLENOL) 325 MG tablet Take by mouth.   anastrozole (ARIMIDEX) 1 MG tablet Take 1 tablet (1 mg total) by mouth daily.   atorvastatin  (LIPITOR) 10 MG tablet Take one tablet (10 mg dose) by mouth daily.   Blood Glucose Monitoring Suppl (ACCU-CHEK GUIDE ME) w/Device KIT See admin instructions.   fexofenadine (ALLEGRA) 180 MG tablet Take by mouth.   fluticasone (FLONASE) 50 MCG/ACT nasal spray    glipiZIDE (GLUCOTROL) 10 MG tablet Take one tablet (10 mg dose) by mouth 2 (two) times daily.   hydrOXYzine (ATARAX) 10 MG tablet Take 1 tablet by mouth 3 (three) times a day as needed for anxiety.   meloxicam (MOBIC) 15 MG tablet Take 1 tablet  by mouth daily as needed for pain.   pioglitazone (ACTOS) 15 MG tablet Take one tablet (15 mg dose) by mouth daily.   triamcinolone cream (KENALOG) 0.1 % Apply topically 2 (two) times a day as needed.   No facility-administered encounter medications on file  as of 02/24/2022.    ALLERGIES:  Allergies  Allergen Reactions   Cephalexin Hives and Rash   Latex Swelling    Itching, swelling   Naproxen Itching   Tramadol Other (See Comments)    Makes patient feel "weird"     FAMILY HISTORY:  Family History  Problem Relation Age of Onset   Hypertension Mother    Colon cancer Sister 76   Cancer Maternal Aunt        unknown type dx late 71s   Diabetes Maternal Grandmother    Breast cancer Neg Hx    Ovarian cancer Neg Hx    Endometrial cancer Neg Hx    Pancreatic cancer Neg Hx    Prostate cancer Neg Hx      SOCIAL HISTORY:  Social Connections: Socially Isolated (08/29/2021)   Social Connection and Isolation Panel [NHANES]    Frequency of Communication with Friends and Family: Three times a week    Frequency of Social Gatherings with Friends and Family: More than three times a week    Attends Religious Services: Never    Marine scientist or Organizations: No    Attends Music therapist: Never    Marital Status: Never married    REVIEW OF SYSTEMS:  + Change in appetite, fever/chills, fatigue, weight gain, hearing loss, ringing in the ears, shortness of breath,  wheezing, chest pain, swelling of the legs, bloating, abdominal pain, hot flashes, joint pain, back pain, muscle pain/cramp, itch, migraines, anxiety, depression, decreased concentration. Denies neck lumps or masses, mouth sores or voice changes. Denies cough. Denies chest pain or palpitations.  Denies blood in stools, constipation, diarrhea, nausea, vomiting, or early satiety. Denies pain with intercourse, dysuria, frequency, hematuria or incontinence. Denies pelvic pain, vaginal bleeding or vaginal discharge.   Denies rash, or wounds. Denies dizziness, numbness or seizures. Denies swollen lymph nodes or glands, denies easy bruising or bleeding.  Physical Exam:  Vital Signs for this encounter:  Blood pressure 128/72, pulse 100, temperature 98.6 F (37 C), temperature source Oral, resp. rate 18, height _0  (1.626 m), weight 226 lb (102.5 kg), SpO2 97 %. Body mass index is 38.79 kg/m. General: Alert, oriented, no acute distress.  HEENT: Normocephalic, atraumatic. Sclera anicteric.  Chest: Clear to auscultation bilaterally. No wheezes, rhonchi, or rales. Cardiovascular: Regular rate and rhythm, no murmurs, rubs, or gallops.  GU: Deferred.  LABORATORY AND RADIOLOGIC DATA:  Outside medical records were reviewed to synthesize the above history, along with the history and physical obtained during the visit.   Lab Results  Component Value Date   WBC 5.0 09/26/2021   HGB 12.8 09/26/2021   HCT 38.6 09/26/2021   PLT 237 09/26/2021   GLUCOSE 221 (H) 09/26/2021   ALT 16 09/26/2021   AST 12 (L) 09/26/2021   NA 139 09/26/2021   K 4.2 09/26/2021   CL 108 09/26/2021   CREATININE 0.58 09/26/2021   BUN 13 09/26/2021   CO2 23 09/26/2021

## 2022-02-24 ENCOUNTER — Encounter: Payer: Self-pay | Admitting: Gynecologic Oncology

## 2022-02-24 ENCOUNTER — Other Ambulatory Visit: Payer: Self-pay

## 2022-02-24 ENCOUNTER — Inpatient Hospital Stay (HOSPITAL_BASED_OUTPATIENT_CLINIC_OR_DEPARTMENT_OTHER): Payer: Medicaid Other | Admitting: Gynecologic Oncology

## 2022-02-24 VITALS — BP 128/72 | HR 100 | Temp 98.6°F | Resp 18 | Ht 64.0 in | Wt 226.0 lb

## 2022-02-24 DIAGNOSIS — Z170 Estrogen receptor positive status [ER+]: Secondary | ICD-10-CM | POA: Diagnosis not present

## 2022-02-24 DIAGNOSIS — N951 Menopausal and female climacteric states: Secondary | ICD-10-CM | POA: Diagnosis not present

## 2022-02-24 DIAGNOSIS — E1165 Type 2 diabetes mellitus with hyperglycemia: Secondary | ICD-10-CM | POA: Diagnosis not present

## 2022-02-24 DIAGNOSIS — N911 Secondary amenorrhea: Secondary | ICD-10-CM | POA: Diagnosis not present

## 2022-02-24 DIAGNOSIS — C50411 Malignant neoplasm of upper-outer quadrant of right female breast: Secondary | ICD-10-CM

## 2022-02-24 NOTE — Patient Instructions (Addendum)
Plan to have a phone visit with Dr. Berline Lopes for follow up in six weeks to assess your blood sugars, check in.   Monitor your blood glucose levels and we will also need to get you in to see your primary care provider to improve your blood glucose levels. Once this has been achieved, we will see you back in the office for a pre-operative visit with [name] NP prior to surgery to discuss the pre and post details.   Ideally, we would like your blood glucose levels to be under 180 before proceeding with surgery, fasting as well.

## 2022-02-26 ENCOUNTER — Encounter: Payer: Self-pay | Admitting: Hematology and Oncology

## 2022-02-27 ENCOUNTER — Telehealth: Payer: Self-pay | Admitting: *Deleted

## 2022-02-27 NOTE — Telephone Encounter (Signed)
Per Dr Tucker fax records and surgical optimization form to the patient's PCP  

## 2022-03-03 ENCOUNTER — Other Ambulatory Visit: Payer: Self-pay | Admitting: Adult Health

## 2022-03-03 ENCOUNTER — Other Ambulatory Visit (HOSPITAL_COMMUNITY): Payer: Self-pay

## 2022-03-03 ENCOUNTER — Encounter: Payer: Self-pay | Admitting: Genetic Counselor

## 2022-03-03 ENCOUNTER — Telehealth: Payer: Self-pay | Admitting: Genetic Counselor

## 2022-03-03 DIAGNOSIS — Z170 Estrogen receptor positive status [ER+]: Secondary | ICD-10-CM

## 2022-03-03 DIAGNOSIS — Z1589 Genetic susceptibility to other disease: Secondary | ICD-10-CM | POA: Insufficient documentation

## 2022-03-03 MED ORDER — ANASTROZOLE 1 MG PO TABS
1.0000 mg | ORAL_TABLET | Freq: Every day | ORAL | 0 refills | Status: DC
  Filled 2022-03-03: qty 30, 30d supply, fill #0

## 2022-03-03 NOTE — Telephone Encounter (Signed)
I contacted Ms. Polendo to discuss her updated genetic testing results. 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 We discussed cancer risks, management, and family implications. Detailed clinic note to follow.  The test report has been scanned into EPIC and is located under the Molecular Pathology section of the Results Review tab.  A portion of the result report is included below for reference.   Lucille Passy, MS, [facility] Genetic Counselor [email or web address] (P) [phone]

## 2022-03-13 ENCOUNTER — Other Ambulatory Visit (HOSPITAL_COMMUNITY): Payer: Self-pay

## 2022-03-13 NOTE — Progress Notes (Signed)
Patient Care Team: Patient, [name] as PCP - General (General Practice) Melody Keens, MD as Consulting Physician (General Surgery) Melody Lose, MD as Consulting Physician (Hematology and Oncology) Melody Pray, MD as Consulting Physician (Radiation Oncology)  DIAGNOSIS:  Encounter Diagnosis  Name Primary?   Malignant neoplasm of upper-outer quadrant of right breast in female, estrogen receptor positive ([place])     SUMMARY OF ONCOLOGIC HISTORY: Oncology History  Malignant neoplasm of upper-outer quadrant of right breast in female, estrogen receptor positive ([place])  07/28/2020 Initial Diagnosis   Patient palpated a right breast mass for 1-2 years. Mammogram showed a 2.2cm mass at the 11 o'clock position with surrounding calcifications, 6.4cm in total extent, and up to 5 abnormal right axillary lymph nodes. 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    08/05/2020 Miscellaneous   MammaPrint: High risk luminal type B   08/11/2020 Genetic Testing   Negative genetic testing: no pathogenic variants detected in [facility] Breast Cancer STAT Panel or Common Hereditary Cancers panel. The report dates are August 11, 2020 and August 19, 2020, respectively. Two variants of uncertain signficance were detected - one in the CTNNA1 gene called c.86del and the second in the MLH1 gene called c.808A>G.   UPDATE:  The MLH1 c.808A>G VUS was reclassified to "Likely Benign" on 02/19/2021. The change in variant classification was made as a result of re-review of the evidence in light of new variant interpretation guidelines and/or new information.   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 The amended report date is February 16, 2022.   The STAT Breast cancer panel offered by [facility] includes sequencing and rearrangement analysis for the  following 9 genes:  ATM, BRCA1, BRCA2, CDH1, CHEK2, PALB2, PTEN, STK11 and TP53.  The Common Hereditary Cancers Panel offered by [facility] includes sequencing and/or deletion duplication testing of the following 48 genes: APC, ATM, AXIN2, BARD1, BMPR1A, BRCA1, BRCA2, BRIP1, CDH1, CDK4, CDKN2A (p14ARF), CDKN2A (p16INK4a), CHEK2, CTNNA1, DICER1, EPCAM (Deletion/duplication testing only), GREM1 (promoter region deletion/duplication testing only), KIT, MEN1, MLH1, MSH2, MSH3, MSH6, MUTYH, NBN, NF1, NTHL1, PALB2, PDGFRA, PMS2, POLD1, POLE, PTEN, RAD50, RAD51C, RAD51D, RNF43, SDHB, SDHC, SDHD, SMAD4, SMARCA4. STK11, TP53, TSC1, TSC2, and VHL.  The following genes were evaluated for sequence changes only: SDHA and HOXB13 c.251G>A variant only.    09/01/2020 - 01/11/2021 Neo-Adjuvant Chemotherapy   Adriamycin and Cytoxan x4 09/01/2020-10/12/2020 Weekly Taxol x 12  10/26/2020-01/11/2021(dose reduced d/t AE)   02/07/2021 Surgery   Right lumpectomy Melody Rodriguez): invasive and in situ ductal carcinoma, 2.8cm, clear margins, with metastatic carcinoma in 1/1 right axillary lymph nodes.   03/28/2021 Surgery   Axillary lymph node dissection: 3/7 lymph nodes +   05/10/2021 - 07/01/2021 Radiation Therapy   Site Technique Total Dose (Gy) Dose per Fx (Gy) Completed Fx Beam Energies  Breast, Right: Breast_Rt 3D 50.4/50.4 1.8 28/28 10X  Breast, Right: Breast_Rt_Bst specialPort 12/12 2 6/6 15E  Sclav-RT: SCV_Rt 3D 50.4/50.4 1.8 28/28      07/2021 -  Anti-estrogen oral therapy   Zoladex + Letrozole + Verzenio     CHIEF COMPLIANT: Follow-up Zoladex Letrozole and Verzenio  INTERVAL HISTORY: Melody Rodriguez is a 46 y.o  Female on anti-estrogen therapy. She presents to the clinic today for follow-up. States that she can not raise her right arm from where she had the  surgery. With the pain its still hard to move it. It affects her daily activities. It hurts to drive so she cant work. Range of motion is not good, she can only raise  her arm out. Not back down or up. Her breast is still hard and swollen and has some discomfort. She is tolerating the anastrozole. Complains of severe right arm pain with stiffness and achiness.  She is not able to move her arm beyond the shoulder level.  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  She is also complaining of tingling and numbness in the fingers.   ALLERGIES:  is allergic to cephalexin, latex, naproxen, and tramadol.  MEDICATIONS:  Current Outpatient Medications  Medication Sig Dispense Refill   ACCU-CHEK GUIDE test strip USE TO MONITOR BLOOD GLUCOSE 3 TIME(S) DAILY     Accu-Chek Softclix Lancets lancets 3 (three) times daily.     acetaminophen (TYLENOL) 325 MG tablet Take by mouth.     anastrozole (ARIMIDEX) 1 MG tablet Take 1 tablet (1 mg total) by mouth daily. 90 tablet 3   atorvastatin (LIPITOR) 10 MG tablet Take one tablet (10 mg dose) by mouth daily. 30 tablet 5   Blood Glucose Monitoring Suppl (ACCU-CHEK GUIDE ME) w/Device KIT See admin instructions.     DULoxetine (CYMBALTA) 30 MG capsule Take 1 capsule (30 mg total) by mouth daily. 30 capsule 2   fexofenadine (ALLEGRA) 180 MG tablet Take by mouth.     fluticasone (FLONASE) 50 MCG/ACT nasal spray      glipiZIDE (GLUCOTROL) 10 MG tablet Take one tablet (10 mg dose) by mouth 2 (two) times daily. 60 tablet 2   hydrOXYzine (ATARAX) 10 MG tablet Take 1 tablet by mouth 3 (three) times a day as needed for anxiety. 90 tablet 0   meloxicam (MOBIC) 15 MG tablet Take 1 tablet  by mouth daily as needed for pain. (Patient not taking: Reported on 03/14/2022) 30 tablet 3   pioglitazone (ACTOS) 15 MG tablet Take one tablet (15 mg dose) by mouth daily. 30 tablet 5   triamcinolone cream (KENALOG) 0.1 % Apply topically 2 (two) times a day as needed. 40 g 1   No current facility-administered medications for this visit.    PHYSICAL EXAMINATION: ECOG PERFORMANCE STATUS: 1 - Symptomatic but completely ambulatory  Vitals:    03/27/22 0916  BP: 134/84  Pulse: 88  Resp: 17  Temp: (!) 97.5 F (36.4 C)  SpO2: 99%   Filed Weights   03/27/22 0916  Weight: 223 lb 1.6 oz (101.2 kg)      LABORATORY DATA:  I have reviewed the data as listed    Latest Ref Rng & Units 09/26/2021    9:51 AM 09/16/2021    4:11 PM 08/01/2021    9:51 AM  CMP  Glucose 70 - 99 mg/dL 221  123  400   BUN 6 - 20 mg/dL _0 Creatinine 0.44 - 1.00 mg/dL 0.58  0.60  1.01   Sodium 135 - 145 mmol/L 139  141  138   Potassium 3.5 - 5.1 mmol/L 4.2  3.7  4.0   Chloride 98 - 111 mmol/L 108  106  102   CO2 22 - 32 mmol/L _1 Calcium 8.9 - 10.3 mg/dL 9.2  9.2  9.9   Total Protein 6.5 - 8.1 g/dL 7.2  7.1  8.7   Total Bilirubin 0.3 - 1.2 mg/dL 0.3  0.4  0.4   Alkaline Phos 38 - 126 U/L 58  56  94   AST 15 - 41 U/L _0 ALT 0 - 44 U/L 16  18  39     Lab Results  Component Value Date   WBC 5.0 09/26/2021   HGB 12.8 09/26/2021   HCT 38.6 09/26/2021   MCV 90.0 09/26/2021   PLT 237 09/26/2021   NEUTROABS 3.3 09/26/2021    ASSESSMENT & PLAN:  Malignant neoplasm of upper-outer quadrant of right breast in female, estrogen receptor positive ([place]) 07/28/2020:Patient palpated a right breast mass for 1-2 years. Mammogram showed a 2.2cm mass at the 11 o'clock position with surrounding calcifications, 6.4cm in total extent, and up to 5 abnormal right axillary lymph nodes. 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   Treatment plan: 1. Neoadjuvant chemotherapy (MammaPrint test High Risk): AC foll by Taxol completed 01/11/21 2. 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 Adjuvant radiation therapy  05/11/2021-07/04/2021 4. Follow-up adjuvant antiestrogen therapy along with abemaciclib (patient has total of 4 lymph nodes positive), but she declined because of concern for toxicities URCC 16070: Treatment of refractory nausea 5. ALND 03/28/21: 3/7 LN positive -------------------------------------------------------------------------------------------------------------------------------  Current treatment: Zoladex plus letrozole (patient did not want to start Verzenio because of concern for toxicities) Toxicities: Leg pains: And joint stiffness and achiness RTC monthly for Injections Hot flashes mild to moderate   Suspect her pains are from diabetes complications Bone Density: 08/31/21: T Score 1.1 (Normal) Mammograms: 09/23/21: Benign, density Cat C   Severe right arm lymphedema: With severe pain and limitation of range of motion: She had been to physical therapist but has not significantly improved.  She also seen orthopedics who performed an right arm MRI as well. I believe that she is not able to work at this time.  She will need to qualify for disability based on my assessment today.   Return to clinic in 1 year for follow-up    No orders of the defined types were placed in this encounter.  The patient has a good understanding of the overall plan. she agrees with it. she will call with any problems that may develop before the next visit here. Total time spent: 30 mins including face to face time and time spent for planning, charting and co-ordination of care   Harriette Ohara, MD 03/27/22    I Gardiner Coins am scribing for Dr. Lindi Adie  I have reviewed the above documentation for accuracy and completeness, and I agree with the above.

## 2022-03-14 ENCOUNTER — Encounter: Payer: Self-pay | Admitting: Physical Medicine & Rehabilitation

## 2022-03-14 ENCOUNTER — Other Ambulatory Visit (HOSPITAL_COMMUNITY): Payer: Self-pay

## 2022-03-14 ENCOUNTER — Ambulatory Visit: Payer: Self-pay | Admitting: Genetic Counselor

## 2022-03-14 ENCOUNTER — Encounter: Payer: Medicaid Other | Attending: Physical Medicine & Rehabilitation | Admitting: Physical Medicine & Rehabilitation

## 2022-03-14 ENCOUNTER — Encounter: Payer: Self-pay | Admitting: Genetic Counselor

## 2022-03-14 ENCOUNTER — Other Ambulatory Visit: Payer: Self-pay | Admitting: Genetic Counselor

## 2022-03-14 VITALS — BP 129/85 | HR 87 | Ht 64.0 in | Wt 226.4 lb

## 2022-03-14 DIAGNOSIS — Z1589 Genetic susceptibility to other disease: Secondary | ICD-10-CM

## 2022-03-14 DIAGNOSIS — Z1379 Encounter for other screening for genetic and chromosomal anomalies: Secondary | ICD-10-CM

## 2022-03-14 DIAGNOSIS — M25511 Pain in right shoulder: Secondary | ICD-10-CM | POA: Insufficient documentation

## 2022-03-14 DIAGNOSIS — G90511 Complex regional pain syndrome I of right upper limb: Secondary | ICD-10-CM | POA: Diagnosis present

## 2022-03-14 DIAGNOSIS — M7581 Other shoulder lesions, right shoulder: Secondary | ICD-10-CM | POA: Diagnosis present

## 2022-03-14 MED ORDER — DULOXETINE HCL 30 MG PO CPEP
30.0000 mg | ORAL_CAPSULE | Freq: Every day | ORAL | 2 refills | Status: DC
Start: 2022-03-14 — End: 2022-04-13
  Filled 2022-03-14: qty 30, 30d supply, fill #0

## 2022-03-14 MED ORDER — GLIPIZIDE 10 MG PO TABS
ORAL_TABLET | ORAL | 2 refills | Status: DC
  Filled 2022-03-14: qty 60, 30d supply, fill #0

## 2022-03-14 NOTE — Progress Notes (Unsigned)
Subjective:    Patient ID: Melody Rodriguez, female    DOB: June 23, 1976, 46 y.o.   MRN: [id number]  HPI 45 year old female with past medical history of diabetes mellitus, breast cancer status post lumpectomy with radioactive seeds 02/07/2021, breast biopsy 02/18/2021 and node dissection 03/28/2021.  Patient reports she began having pain in her right upper extremity after her surgeries for treatment of breast cancer.  She developed pain in her shoulder and the pain spread to her entire arm.  Her arm is very sensitive to touch and has increased swelling.  She reports it has appeared red and swollen at times.  She has difficulty flexing or abducting her shoulder.  She keeps the arm by her side with elbow flexed and wrist flexed.  She had worked with PT although she had to stop it due to pain.  She reports the function in her right arm improved after the physical therapy.  She also has pain when she looks to the right with her head.  She cannot sleep on her right side and uses pillows to avoid sensation to her right arm.  Mobic does not provide significant benefit, 2x shoulder injections did not help the pain.  She reports her mood is decreased due to the pain.   Pain Inventory Average Pain 8 Pain Right Now 8 My pain is constant, sharp, burning, stabbing, and aching  In the last 24 hours, has pain interfered with the following? General activity 7 Relation with others 9 Enjoyment of life 10 What TIME of day is your pain at its worst? morning , daytime, evening, and night Sleep (in general) Poor  Pain is worse with: walking, bending, sitting, standing, and some activites Pain improves with:  nothing -- cannot take pain medication or sedating medication because of autistic son (12 yrs) Relief from Meds: 0  walk without assistance how many minutes can you walk? 10-15 ability to climb steps?  yes do you drive?  yes  not employed: date last employed July 2022 after last breast cancer  surgery  weakness tingling trouble walking spasms dizziness confusion depression anxiety  Any changes since last visit?  no  has had MRI of shoulder  Any changes since last visit?  no    Family History  Problem Relation Age of Onset   Hypertension Mother    Colon cancer Sister 48   Cancer Maternal Aunt        unknown type dx late 58s   Diabetes Maternal Grandmother    Breast cancer Neg Hx    Ovarian cancer Neg Hx    Endometrial cancer Neg Hx    Pancreatic cancer Neg Hx    Prostate cancer Neg Hx    Social History   Socioeconomic History   Marital status: Single    Spouse name: Not on file   Number of children: 2   Years of education: Not on file   Highest education level: Some college, no degree  Occupational History   Not on file  Tobacco Use   Smoking status: Former    Packs/day: 1.00    Years: 22.00    Total pack years: 22.00    Types: Cigars, Cigarettes   Smokeless tobacco: Never   Tobacco comments:    quit 07/2020  Vaping Use   Vaping Use: Never used  Substance and Sexual Activity   Alcohol use: Yes    Comment: occas   Drug use: Yes    Types: Marijuana    Comment: chemo/ pain  mgmt   Sexual activity: Not Currently  Other Topics Concern   Not on file  Social History Narrative   Not on file   Social Determinants of Health   Financial Resource Strain: High Risk (08/29/2021)   Overall Financial Resource Strain (CARDIA)    Difficulty of Paying Living Expenses: Hard  Food Insecurity: Food Insecurity Present (08/29/2021)   Hunger Vital Sign    Worried About Running Out of Food in the Last Year: Sometimes true    Ran Out of Food in the Last Year: Sometimes true  Transportation Needs: No Transportation Needs (08/29/2021)   PRAPARE - Hydrologist (Medical): No    Lack of Transportation (Non-Medical): No  Physical Activity: Inactive (08/29/2021)   Exercise Vital Sign    Days of Exercise per Week: 0 days    Minutes of  Exercise per Session: 0 min  Stress: Stress Concern Present (08/29/2021)   [place]    Feeling of Stress : To some extent  Social Connections: Socially Isolated (08/29/2021)   Social Connection and Isolation Panel [NHANES]    Frequency of Communication with Friends and Family: Three times a week    Frequency of Social Gatherings with Friends and Family: More than three times a week    Attends Religious Services: Never    Marine scientist or Organizations: No    Attends Archivist Meetings: Never    Marital Status: Never married   Past Surgical History:  Procedure Laterality Date   BREAST BIOPSY Right 02/18/2021   Procedure: EVACUATION HEMATOMA RIGHT AXILLA;  Surgeon: [name], MD;  Location: [place];  Service: General;  Laterality: Right;   BREAST CYST EXCISION Right    Patient does not recall (2014 or 2015)   BREAST LUMPECTOMY WITH RADIOACTIVE SEED AND SENTINEL LYMPH NODE BIOPSY Right 02/07/2021   Procedure: RIGHT BREAST LUMPECTOMY WITH RADIOACTIVE SEED AND SEED TARGETED LYMPH NODE BIOPSY AND SENTINEL LYMPH NODE BIOPSY;  Surgeon: [name], MD;  Location: [place];  Service: General;  Laterality: Right;   CESAREAN SECTION     x2   IR IMAGING GUIDED PORT INSERTION  09/15/2020   IR REMOVAL TUN CV CATH W/O FL  09/15/2020   NODE DISSECTION Right 03/28/2021   Procedure: RIGHT AXILLARY LYMPH NODE DISSECTION;  Surgeon: [name], MD;  Location: [place];  Service: General;  Laterality: Right;   PORT-A-CATH REMOVAL Left 02/07/2021   Procedure: REMOVAL PORT-A-CATH;  Surgeon: [name], MD;  Location: [place];  Service: General;  Laterality: Left;   THYROIDECTOMY, PARTIAL     Past Medical History:  Diagnosis Date   Breast cancer ([place])    Diabetes mellitus without complication ([place])    glipizide and actps;  cbgs 200s fasting   Family history of colon cancer    History of goiter    History of radiation therapy    right breast/scv  05/10/21-07/01/21  Dr Gery Pray   Hyperlipidemia    BP 129/85   Pulse 87   Ht '5\' 4"'$  (1.626 m)   Wt 226 lb 6.4 oz (102.7 kg)   SpO2 96%   BMI 38.86 kg/m   Opioid Risk Score:   Fall Risk Score:  `1  Depression screen [facility] 2/9     03/14/2022    9:50 AM 08/29/2021    9:00 AM 08/19/2021    9:18 AM  Depression screen  PHQ 2/9  Decreased Interest 3  2  Down, Depressed, Hopeless '3 1 2  '$ PHQ - 2 Score '6 1 4  '$ Altered sleeping '3 1 3  '$ Tired, decreased energy '3 2 2  '$ Change in appetite 3 0 3  Feeling bad or failure about yourself  3  3  Trouble concentrating '3 3 3  '$ Moving slowly or fidgety/restless 3 0 1  Suicidal thoughts 1 0 0  PHQ-9 Score '25 7 19  '$ Difficult doing work/chores Very difficult       Review of Systems  Constitutional:  Positive for diaphoresis and unexpected weight change.  HENT: Negative.    Eyes: Negative.   Respiratory:  Positive for cough, shortness of breath and wheezing.   Cardiovascular:        Limb swelling - not leg  Gastrointestinal:  Positive for abdominal pain, constipation and diarrhea.  Endocrine:       High blood sugars  Genitourinary: Negative.   Musculoskeletal:        Right shoulder pain  Skin:  Positive for rash.  Allergic/Immunologic: Negative.   Neurological:  Positive for weakness.       Tingling  Hematological: Negative.   Psychiatric/Behavioral:  Positive for confusion and dysphoric mood. The patient is nervous/anxious.   All other systems reviewed and are negative.      Objective:   Physical Exam  Gen: no distress, normal appearing HEENT: oral mucosa pink and moist, NCAT Cardio: Reg rate Chest: normal effort, normal rate of breathing Abd: soft, non-distended Ext: no edema Psych: pleasant, normal affect  Skin: intact Neuro: Alert and oriented, sensation intact in all 4 extremities, DTR normal and  symmetric Musculoskeletal:  Strength 5/5 in LUE and B/L LE Strength at last 4/5 RUE limited by pain Limited active range of motion to about 90 degrees of the right shoulder Pain to palpation throughout RUE Allodynia RUE Swelling in right upper extremity compared to left upper extremity Spurling's negative No paraspinal tenderness Pain with shoulder abduction, internal rotation and external rotation Tenderness over right trapezius Pain with wrist and elbow flexion and extension Patient keeps her arm by her side with her elbow and wrist flexed  MRI shoulder R IMPRESSION: 1. Mild distal infraspinatus tendinosis. No evidence of rotator cuff tear. No evidence of labral tear. 2. Faint intramuscular edema within teres minor, consistent with low-grade muscle strain. 3. Mild soft tissue edema within the right axilla compatible with history of prior axillary lymph node dissection.        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 Possibly component of frozen shoulder contributing.  Prior MRI shows infraspinatus tendinitis -Order duloxetine 30 mg daily -Continue meloxicam -Discussed home exercise program, consider restart formal PT next visit -Triple phase bone scan may be option for further evaluation, consider at a later time -Sympathetic block may be an option at later time   Mood disorder, patient reports mood is decreased due to her pain denies SI or HI -Duloxetine may provide additional benefit for her mood

## 2022-03-22 ENCOUNTER — Ambulatory Visit: Payer: Medicaid Other | Admitting: Hematology and Oncology

## 2022-03-22 ENCOUNTER — Ambulatory Visit: Payer: Medicaid Other

## 2022-03-27 ENCOUNTER — Other Ambulatory Visit: Payer: Self-pay

## 2022-03-27 ENCOUNTER — Other Ambulatory Visit (HOSPITAL_COMMUNITY): Payer: Self-pay

## 2022-03-27 ENCOUNTER — Inpatient Hospital Stay: Payer: Medicaid Other | Attending: Hematology and Oncology

## 2022-03-27 ENCOUNTER — Inpatient Hospital Stay: Payer: Medicaid Other | Admitting: Hematology and Oncology

## 2022-03-27 DIAGNOSIS — Z923 Personal history of irradiation: Secondary | ICD-10-CM | POA: Insufficient documentation

## 2022-03-27 DIAGNOSIS — E119 Type 2 diabetes mellitus without complications: Secondary | ICD-10-CM | POA: Insufficient documentation

## 2022-03-27 DIAGNOSIS — F17290 Nicotine dependence, other tobacco product, uncomplicated: Secondary | ICD-10-CM | POA: Insufficient documentation

## 2022-03-27 DIAGNOSIS — C50411 Malignant neoplasm of upper-outer quadrant of right female breast: Secondary | ICD-10-CM | POA: Diagnosis not present

## 2022-03-27 DIAGNOSIS — Z170 Estrogen receptor positive status [ER+]: Secondary | ICD-10-CM | POA: Diagnosis not present

## 2022-03-27 DIAGNOSIS — Z7984 Long term (current) use of oral hypoglycemic drugs: Secondary | ICD-10-CM | POA: Insufficient documentation

## 2022-03-27 DIAGNOSIS — E785 Hyperlipidemia, unspecified: Secondary | ICD-10-CM | POA: Insufficient documentation

## 2022-03-27 DIAGNOSIS — Z79899 Other long term (current) drug therapy: Secondary | ICD-10-CM | POA: Insufficient documentation

## 2022-03-27 DIAGNOSIS — Z79811 Long term (current) use of aromatase inhibitors: Secondary | ICD-10-CM | POA: Diagnosis not present

## 2022-03-27 DIAGNOSIS — Z5111 Encounter for antineoplastic chemotherapy: Secondary | ICD-10-CM | POA: Diagnosis not present

## 2022-03-27 DIAGNOSIS — C773 Secondary and unspecified malignant neoplasm of axilla and upper limb lymph nodes: Secondary | ICD-10-CM | POA: Diagnosis present

## 2022-03-27 MED ORDER — ANASTROZOLE 1 MG PO TABS
1.0000 mg | ORAL_TABLET | Freq: Every day | ORAL | 3 refills | Status: DC
  Filled 2022-03-27 – 2022-04-05 (×2): qty 90, 90d supply, fill #0
  Filled 2022-05-15 – 2022-06-27 (×2): qty 90, 90d supply, fill #1
  Filled 2022-08-07 – 2022-09-12 (×2): qty 90, 90d supply, fill #2
  Filled 2022-12-29: qty 90, 90d supply, fill #3

## 2022-03-27 MED ORDER — GOSERELIN ACETATE 3.6 MG ~~LOC~~ IMPL
3.6000 mg | DRUG_IMPLANT | Freq: Once | SUBCUTANEOUS | Status: AC
  Administered 2022-03-27: 3.6 mg via SUBCUTANEOUS
  Filled 2022-03-27: qty 3.6

## 2022-03-27 NOTE — Patient Instructions (Signed)
Goserelin injection What is this medication? GOSERELIN (GOE se rel in) is similar to a hormone found in the body. It lowers the amount of sex hormones that the body makes. Men will have lower testosterone levels and women will have lower estrogen levels while taking this medicine. In men, this medicine is used to treat prostate cancer; the injection is either given once per month or once every 12 weeks. A once per month injection (only) is used to treat women with endometriosis, dysfunctional uterine bleeding, or advanced breast cancer. This medicine may be used for other purposes; ask your health care provider or pharmacist if you have questions. COMMON BRAND NAME(S): Zoladex, Zoladex 37-Month What should I tell my care team before I take this medication? They need to know if you have any of these conditions: bone problems diabetes heart disease history of irregular heartbeat an unusual or allergic reaction to goserelin, other medicines, foods, dyes, or preservatives pregnant or trying to get pregnant breast-feeding How should I use this medication? This medicine is for injection under the skin. It is given by a health care professional in a hospital or clinic setting. Talk to your pediatrician regarding the use of this medicine in children. Special care may be needed. Overdosage: If you think you have taken too much of this medicine contact a poison control center or emergency room at once. NOTE: This medicine is only for you. Do not share this medicine with others. What if I miss a dose? It is important not to miss your dose. Call your doctor or health care professional if you are unable to keep an appointment. What may interact with this medication? Do not take this medicine with any of the following medications: cisapride dronedarone pimozide thioridazine This medicine may also interact with the following medications: other medicines that prolong the QT interval (an abnormal heart  rhythm) This list may not describe all possible interactions. Give your health care provider a list of all the medicines, herbs, non-prescription drugs, or dietary supplements you use. Also tell them if you smoke, drink alcohol, or use illegal drugs. Some items may interact with your medicine. What should I watch for while using this medication? Visit your doctor or health care provider for regular checks on your progress. Your symptoms may appear to get worse during the first weeks of this therapy. Tell your doctor or healthcare provider if your symptoms do not start to get better or if they get worse after this time. Your bones may get weaker if you take this medicine for a long time. If you smoke or frequently drink alcohol you may increase your risk of bone loss. A family history of osteoporosis, chronic use of drugs for seizures (convulsions), or corticosteroids can also increase your risk of bone loss. Talk to your doctor about how to keep your bones strong. This medicine should stop regular monthly menstruation in women. Tell your doctor if you continue to menstruate. Women should not become pregnant while taking this medicine or for 12 weeks after stopping this medicine. Women should inform their doctor if they wish to become pregnant or think they might be pregnant. There is a potential for serious side effects to an unborn child. Talk to your health care professional or pharmacist for more information. Do not breast-feed an infant while taking this medicine. Men should inform their doctors if they wish to father a child. This medicine may lower sperm counts. Talk to your health care professional or pharmacist for more information. This  medicine may increase blood sugar. Ask your healthcare provider if changes in diet or medicines are needed if you have diabetes. What side effects may I notice from receiving this medication? Side effects that you should report to your doctor or health care  professional as soon as possible: allergic reactions like skin rash, itching or hives, swelling of the face, lips, or tongue bone pain breathing problems changes in vision chest pain feeling faint or lightheaded, falls fever, chills pain, swelling, warmth in the leg pain, tingling, numbness in the hands or feet signs and symptoms of high blood sugar such as being more thirsty or hungry or having to urinate more than normal. You may also feel very tired or have blurry vision signs and symptoms of low blood pressure like dizziness; feeling faint or lightheaded, falls; unusually weak or tired stomach pain swelling of the ankles, feet, hands trouble passing urine or change in the amount of urine unusually high or low blood pressure unusually weak or tired Side effects that usually do not require medical attention (report to your doctor or health care professional if they continue or are bothersome): change in sex drive or performance changes in breast size in both males and females changes in emotions or moods headache hot flashes irritation at site where injected loss of appetite skin problems like acne, dry skin vaginal dryness This list may not describe all possible side effects. Call your doctor for medical advice about side effects. You may report side effects to FDA at 1-800-FDA-1088. Where should I keep my medication? This drug is given in a hospital or clinic and will not be stored at home. NOTE: This sheet is a summary. It may not cover all possible information. If you have questions about this medicine, talk to your doctor, pharmacist, or health care provider.  2023 Elsevier/Gold Standard (2019-01-03 00:00:00)

## 2022-03-27 NOTE — Assessment & Plan Note (Signed)
07/28/2020:Patient palpated a right breast mass for 1-2 years. Mammogram showed a 2.2cm mass at the 11 o'clock position with surrounding calcifications, 6.4cm in total extent, and up to 5 abnormal right axillary lymph nodes. 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  Treatment plan: 1. 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 Adjuvant radiation therapy8/24/2022-07/04/2021 4. Follow-up adjuvant antiestrogen therapyalong with abemaciclib (patient has total of 4 lymph nodes positive), but she declined because of concern for toxicities URCC 16070: Treatment of refractory nausea 5. ALND 03/28/21: 3/7 LN positive ------------------------------------------------------------------------------------------------------------------------------- Current treatment:Zoladex plus letrozole(patient did not want to start Verzenio because of concern for toxicities) Toxicities: 1. Leg pains: And joint stiffness and achiness 2. RTC monthly for Injections 3. Hot flashes mild to moderate  Suspect her pains are from diabetes complications Bone Density: 08/31/21: T Score 1.1 (Normal) Mammograms: 09/23/21: Benign, density Cat C   Severe right arm lymphedema:  Right shoulder pain: Shoulder MRI was performed.  Waiting to see orthopedics.  Return to clinicin 6 months for follow-up

## 2022-04-04 ENCOUNTER — Inpatient Hospital Stay (HOSPITAL_BASED_OUTPATIENT_CLINIC_OR_DEPARTMENT_OTHER): Payer: Medicaid Other | Admitting: Gynecologic Oncology

## 2022-04-04 ENCOUNTER — Encounter: Payer: Self-pay | Admitting: Gynecologic Oncology

## 2022-04-04 ENCOUNTER — Other Ambulatory Visit (HOSPITAL_COMMUNITY): Payer: Self-pay

## 2022-04-04 DIAGNOSIS — Z170 Estrogen receptor positive status [ER+]: Secondary | ICD-10-CM

## 2022-04-04 DIAGNOSIS — E1165 Type 2 diabetes mellitus with hyperglycemia: Secondary | ICD-10-CM

## 2022-04-04 DIAGNOSIS — C50919 Malignant neoplasm of unspecified site of unspecified female breast: Secondary | ICD-10-CM | POA: Diagnosis not present

## 2022-04-04 NOTE — Progress Notes (Signed)
Gynecologic Oncology Telehealth Consult Note: Gyn-Onc  I connected with Melody Rodriguez on 04/04/22 at  4:15 PM EDT by telephone and verified that I am speaking with the correct person using two identifiers.  I discussed the limitations, risks, security and privacy concerns of performing an evaluation and management service by telemedicine and the availability of in-person appointments. I also discussed with the patient that there may be a patient responsible charge related to this service. The patient expressed understanding and agreed to proceed.  Other persons participating in the visit and their role in the encounter: None.  Patient's location: Home Provider's location: [facility]  Reason for Visit: Follow-up regarding risk-reducing BSO  Treatment History: Oncology History  Malignant neoplasm of upper-outer quadrant of right breast in female, estrogen receptor positive ([place])  07/28/2020 Initial Diagnosis   Patient palpated a right breast mass for 1-2 years. Mammogram showed a 2.2cm mass at the 11 o'clock position with surrounding calcifications, 6.4cm in total extent, and up to 5 abnormal right axillary lymph nodes. 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    08/05/2020 Miscellaneous   MammaPrint: High risk luminal type B   08/11/2020 Genetic Testing   Negative genetic testing: no pathogenic variants detected in [facility] Breast Cancer STAT Panel or Common Hereditary Cancers panel. The report dates are August 11, 2020 and August 19, 2020, respectively. Two variants of uncertain signficance were detected - one in the CTNNA1 gene called c.86del and the second in the MLH1 gene called c.808A>G.   UPDATE:  The MLH1 c.808A>G VUS was reclassified to "Likely Benign" on 02/19/2021. The change in variant classification was made as a result of  re-review of the evidence in light of new variant interpretation guidelines and/or new information.   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 The amended report date is February 16, 2022.   The STAT Breast cancer panel offered by [facility] includes sequencing and rearrangement analysis for the following 9 genes:  ATM, BRCA1, BRCA2, CDH1, CHEK2, PALB2, PTEN, STK11 and TP53.  The Common Hereditary Cancers Panel offered by [facility] includes sequencing and/or deletion duplication testing of the following 48 genes: APC, ATM, AXIN2, BARD1, BMPR1A, BRCA1, BRCA2, BRIP1, CDH1, CDK4, CDKN2A (p14ARF), CDKN2A (p16INK4a), CHEK2, CTNNA1, DICER1, EPCAM (Deletion/duplication testing only), GREM1 (promoter region deletion/duplication testing only), KIT, MEN1, MLH1, MSH2, MSH3, MSH6, MUTYH, NBN, NF1, NTHL1, PALB2, PDGFRA, PMS2, POLD1, POLE, PTEN, RAD50, RAD51C, RAD51D, RNF43, SDHB, SDHC, SDHD, SMAD4, SMARCA4. STK11, TP53, TSC1, TSC2, and VHL.  The following genes were evaluated for sequence changes only: SDHA and HOXB13 c.251G>A variant only.    09/01/2020 - 01/11/2021 Neo-Adjuvant Chemotherapy   Adriamycin and Cytoxan x4 09/01/2020-10/12/2020 Weekly Taxol x 12  10/26/2020-01/11/2021(dose reduced d/t AE)   02/07/2021 Surgery   Right lumpectomy [name]): invasive and in situ ductal carcinoma, 2.8cm, clear margins, with metastatic carcinoma in 1/1 right axillary lymph nodes.   03/28/2021 Surgery   Axillary lymph node dissection: 3/7 lymph nodes +   05/10/2021 - 07/01/2021 Radiation Therapy   Site Technique Total Dose (Gy) Dose per Fx (Gy) Completed Fx Beam Energies  Breast, Right: Breast_Rt 3D 50.4/50.4 1.8 28/28 10X  Breast, Right: Breast_Rt_Bst specialPort 12/12 2 6/6 15E  Sclav-RT: SCV_Rt 3D 50.4/50.4 1.8 28/28      07/2021 -  Anti-estrogen oral therapy   Zoladex + Letrozole +  Verzenio     Interval History: Doing well. CBGs out of the 200s. Range 110-120s most of the time.  Would  like to move forward with scheduling surgery.   Past Medical/Surgical History: Past Medical History:  Diagnosis Date   Breast cancer ([place])    Diabetes mellitus without complication ([place])    glipizide and actps; cbgs 200s fasting   Family history of colon cancer    History of goiter    History of radiation therapy    right breast/scv  05/10/21-07/01/21  Dr Gery Pray   Hyperlipidemia     Past Surgical History:  Procedure Laterality Date   BREAST BIOPSY Right 02/18/2021   Procedure: EVACUATION HEMATOMA RIGHT AXILLA;  Surgeon: [name], MD;  Location: [place];  Service: General;  Laterality: Right;   BREAST CYST EXCISION Right    Patient does not recall (2014 or 2015)   BREAST LUMPECTOMY WITH RADIOACTIVE SEED AND SENTINEL LYMPH NODE BIOPSY Right 02/07/2021   Procedure: RIGHT BREAST LUMPECTOMY WITH RADIOACTIVE SEED AND SEED TARGETED LYMPH NODE BIOPSY AND SENTINEL LYMPH NODE BIOPSY;  Surgeon: [name], MD;  Location: [place];  Service: General;  Laterality: Right;   CESAREAN SECTION     x2   IR IMAGING GUIDED PORT INSERTION  09/15/2020   IR REMOVAL TUN CV CATH W/O FL  09/15/2020   NODE DISSECTION Right 03/28/2021   Procedure: RIGHT AXILLARY LYMPH NODE DISSECTION;  Surgeon: [name], MD;  Location: [place];  Service: General;  Laterality: Right;   PORT-A-CATH REMOVAL Left 02/07/2021   Procedure: REMOVAL PORT-A-CATH;  Surgeon: [name], MD;  Location: [place];  Service: General;  Laterality: Left;   THYROIDECTOMY, PARTIAL      Family History  Problem Relation Age of Onset   Hypertension Mother    Colon cancer Sister 26   Cancer Maternal Aunt        unknown type dx late 71s   Diabetes Maternal Grandmother    Breast cancer Neg Hx    Ovarian cancer Neg Hx    Endometrial cancer Neg Hx    Pancreatic cancer Neg Hx    Prostate cancer Neg Hx     Social History    Socioeconomic History   Marital status: Single    Spouse name: Not on file   Number of children: 2   Years of education: Not on file   Highest education level: Some college, no degree  Occupational History   Not on file  Tobacco Use   Smoking status: Former    Packs/day: 1.00    Years: 22.00    Total pack years: 22.00    Types: Cigars, Cigarettes   Smokeless tobacco: Never   Tobacco comments:    quit 07/2020  Vaping Use   Vaping Use: Never used  Substance and Sexual Activity   Alcohol use: Yes    Comment: occas   Drug use: Yes    Types: Marijuana    Comment: chemo/ pain mgmt   Sexual activity: Not Currently  Other Topics Concern   Not on file  Social History Narrative   Not on file   Social Determinants of Health   Financial Resource Strain: High Risk (08/29/2021)   Overall Financial Resource Strain (CARDIA)    Difficulty of Paying Living Expenses: Hard  Food Insecurity: Food Insecurity Present (08/29/2021)   Hunger Vital Sign    Worried About Running Out of Food in the Last Year: Sometimes true  Ran Out of Food in the Last Year: Sometimes true  Transportation Needs: No Transportation Needs (08/29/2021)   PRAPARE - Hydrologist (Medical): No    Lack of Transportation (Non-Medical): No  Physical Activity: Inactive (08/29/2021)   Exercise Vital Sign    Days of Exercise per Week: 0 days    Minutes of Exercise per Session: 0 min  Stress: Stress Concern Present (08/29/2021)   [place]    Feeling of Stress : To some extent  Social Connections: Socially Isolated (08/29/2021)   Social Connection and Isolation Panel [NHANES]    Frequency of Communication with Friends and Family: Three times a week    Frequency of Social Gatherings with Friends and Family: More than three times a week    Attends Religious Services: Never    Marine scientist or Organizations: No     Attends Music therapist: Never    Marital Status: Never married    Current Medications:  Current Outpatient Medications:    ACCU-CHEK GUIDE test strip, USE TO MONITOR BLOOD GLUCOSE 3 TIME(S) DAILY, Disp: , Rfl:    Accu-Chek Softclix Lancets lancets, 3 (three) times daily., Disp: , Rfl:    acetaminophen (TYLENOL) 325 MG tablet, Take by mouth., Disp: , Rfl:    anastrozole (ARIMIDEX) 1 MG tablet, Take 1 tablet (1 mg total) by mouth daily., Disp: 90 tablet, Rfl: 3   atorvastatin (LIPITOR) 10 MG tablet, Take one tablet (10 mg dose) by mouth daily., Disp: 30 tablet, Rfl: 5   Blood Glucose Monitoring Suppl (ACCU-CHEK GUIDE ME) w/Device KIT, See admin instructions., Disp: , Rfl:    DULoxetine (CYMBALTA) 30 MG capsule, Take 1 capsule (30 mg total) by mouth daily., Disp: 30 capsule, Rfl: 2   fexofenadine (ALLEGRA) 180 MG tablet, Take by mouth., Disp: , Rfl:    fluticasone (FLONASE) 50 MCG/ACT nasal spray, , Disp: , Rfl:    glipiZIDE (GLUCOTROL) 10 MG tablet, Take one tablet (10 mg dose) by mouth 2 (two) times daily., Disp: 60 tablet, Rfl: 2   hydrOXYzine (ATARAX) 10 MG tablet, Take 1 tablet by mouth 3 (three) times a day as needed for anxiety., Disp: 90 tablet, Rfl: 0   meloxicam (MOBIC) 15 MG tablet, Take 1 tablet  by mouth daily as needed for pain. (Patient not taking: Reported on 03/14/2022), Disp: 30 tablet, Rfl: 3   pioglitazone (ACTOS) 15 MG tablet, Take one tablet (15 mg dose) by mouth daily., Disp: 30 tablet, Rfl: 5   triamcinolone cream (KENALOG) 0.1 %, Apply topically 2 (two) times a day as needed., Disp: 40 g, Rfl: 1  Physical Exam: There were no vitals taken for this visit. Deferred given limitations of phone visit.  Laboratory & Radiologic Studies: None new  Assessment & Plan: Melody Rodriguez is a 46 y.o. woman with ER+ breast cancer interested in therapeutic BSO.  Patient has been able to improve her glycemic control considerably.  Notes no blood sugars in the 200s  since her visit with me.  Discussed moving forward with getting therapeutic BSO scheduled.  We will have my office reach out to her tomorrow to pick a date for surgery in August.  We will plan to have her come in for a hemoglobin A1c at the time of her preoperative visit within a couple of weeks of surgery.  I discussed the assessment and treatment plan with the patient. The patient was provided with an  opportunity to ask questions and all were answered. The patient agreed with the plan and demonstrated an understanding of the instructions.   The patient was advised to call back or see an in-person evaluation if the symptoms worsen or if the condition fails to improve as anticipated.   8 minutes of total time was spent for this patient encounter, including preparation, face-to-face counseling with the patient and coordination of care, and documentation of the encounter.   Jeral Pinch, MD  Division of Gynecologic Oncology  Department of Obstetrics and Gynecology  [facility]

## 2022-04-05 ENCOUNTER — Other Ambulatory Visit (HOSPITAL_COMMUNITY): Payer: Self-pay

## 2022-04-05 ENCOUNTER — Telehealth: Payer: Self-pay

## 2022-04-05 ENCOUNTER — Other Ambulatory Visit: Payer: Self-pay | Admitting: Gynecologic Oncology

## 2022-04-05 ENCOUNTER — Inpatient Hospital Stay: Payer: Medicaid Other | Admitting: Gynecologic Oncology

## 2022-04-05 DIAGNOSIS — Z170 Estrogen receptor positive status [ER+]: Secondary | ICD-10-CM

## 2022-04-05 DIAGNOSIS — C50411 Malignant neoplasm of upper-outer quadrant of right female breast: Secondary | ICD-10-CM

## 2022-04-05 NOTE — Telephone Encounter (Signed)
Received call from patient. She would like to proceed with surgery on 04/26/22. Pre-op appointment scheduled with [name], NP on 04/20/22 at 10:30 with a lab appointment at 10:15 to check A1C. Advised patient she needs to stop taking mobic 7-10 days prior to surgery. Will reach out for guidance on Arimidex. Advised patient she will be receive a call from the hospital to arrange a pre-admissions appointment. Patient verbalized understanding, instructed to call with any needs.

## 2022-04-05 NOTE — Telephone Encounter (Signed)
Attempted to contact patient to review surgery dates. Unable to reach patient, left message requesting return call.

## 2022-04-05 NOTE — Telephone Encounter (Signed)
Spoke with patient and reviewed possible surgical dates of 04/26/22, 04/26/22, 05/02/22, 05/03/22, 05/09/22, 05/10/22 or 05/17/22. Patient is going to discuss surgery dates with her sister and notify our office of her decision.

## 2022-04-11 ENCOUNTER — Encounter: Payer: Self-pay | Admitting: Hematology and Oncology

## 2022-04-12 NOTE — Progress Notes (Addendum)
Anesthesia Review:  PCP: DR Nathaneil Canary ( [facility])  - has appt on 04/18/22 for clearance per pt  Cardiologist : none  Chest x-ray : EKG : 04/17/22  Echo : Stress test: Cardiac Cath :  Activity level: can do a flight of stairs without difficutly  Sleep Study/ CPAP : none  Fasting Blood Sugar :      / Checks Blood Sugar -- times a day:   Blood Thinner/ Instructions /Last Dose: ASA / Instructions/ Last Dose :   DM- type 2 checks glucose twice daily  Hgba1c-  04/17/22 -9.3 - routed to DR Dorian Pod and Zoila Shutter  Janett Billow [facility] made aware.

## 2022-04-13 ENCOUNTER — Encounter: Payer: Medicaid Other | Attending: Physical Medicine & Rehabilitation | Admitting: Physical Medicine & Rehabilitation

## 2022-04-13 ENCOUNTER — Encounter: Payer: Self-pay | Admitting: Physical Medicine & Rehabilitation

## 2022-04-13 VITALS — BP 122/80 | HR 92 | Ht 64.0 in | Wt 222.0 lb

## 2022-04-13 DIAGNOSIS — F0630 Mood disorder due to known physiological condition, unspecified: Secondary | ICD-10-CM

## 2022-04-13 DIAGNOSIS — M79621 Pain in right upper arm: Secondary | ICD-10-CM

## 2022-04-13 DIAGNOSIS — G90511 Complex regional pain syndrome I of right upper limb: Secondary | ICD-10-CM

## 2022-04-13 MED ORDER — AMITRIPTYLINE HCL 10 MG PO TABS: 25.0000 mg | ORAL_TABLET | Freq: Every day | ORAL | Status: DC

## 2022-04-13 NOTE — Progress Notes (Signed)
Subjective:    Patient ID: Melody Rodriguez, female    DOB: [birth year], 46 y.o.   MRN: [id number]  HPI  46 year old female with past medical history of diabetes mellitus, breast cancer status post lumpectomy with radioactive seeds 02/07/2021, breast biopsy 02/18/2021 and node dissection 03/28/2021.  Patient reports she began having pain in her right upper extremity after her surgeries for treatment of breast cancer.  She developed pain in her shoulder and the pain spread to her entire arm.  Her arm is very sensitive to touch and has increased swelling.  She reports it has appeared red and swollen at times.  She has difficulty flexing or abducting her shoulder.  She keeps the arm by her side with elbow flexed and wrist flexed.  She had worked with PT although she had to stop it due to pain.  She reports the function in her right arm improved after the physical therapy.  She also has pain when she looks to the right with her head.  She cannot sleep on her right side and uses pillows to avoid sensation to her right arm.  Mobic does not provide significant benefit, 2x shoulder injections did not help the pain.  She reports her mood is decreased due to the pain.  Interval History Patient is here for follow-up of her right upper extremity pain.  She reports she continues to have severe pain in her right arm and shoulder.  She continues to have allodynia and limited range of motion of the right shoulder.  Patient reports she developed a rash after duloxetine was started.  She does not think this was helping her pain significantly.  She reports she is continuing her home exercise therapy, she says PT was too painful in the past and she cannot afford it at this time.  Pain Inventory Average Pain 9 Pain Right Now 7 My pain is constant, sharp, burning, stabbing, tingling, and aching  In the last 24 hours, has pain interfered with the following? General activity 7 Relation with others 7 Enjoyment of life 10 What  TIME of day is your pain at its worst? morning , daytime, evening, and night Sleep (in general) Poor  Pain is worse with: walking, bending, sitting, inactivity, standing, and some activites Pain improves with:  nothing Relief from Meds: 0      Family History  Problem Relation Age of Onset   Hypertension Mother    Colon cancer Sister 43   Cancer Maternal Aunt        unknown type dx late 47s   Diabetes Maternal Grandmother    Breast cancer Neg Hx    Ovarian cancer Neg Hx    Endometrial cancer Neg Hx    Pancreatic cancer Neg Hx    Prostate cancer Neg Hx    Social History   Socioeconomic History   Marital status: Single    Spouse name: Not on file   Number of children: 2   Years of education: Not on file   Highest education level: Some college, no degree  Occupational History   Not on file  Tobacco Use   Smoking status: Former    Packs/day: 1.00    Years: 22.00    Total pack years: 22.00    Types: Cigars, Cigarettes   Smokeless tobacco: Never   Tobacco comments:    quit 07/2020  Vaping Use   Vaping Use: Never used  Substance and Sexual Activity   Alcohol use: Yes    Comment: occas  Drug use: Yes    Types: Marijuana    Comment: chemo/ pain mgmt   Sexual activity: Not Currently  Other Topics Concern   Not on file  Social History Narrative   Not on file   Social Determinants of Health   Financial Resource Strain: High Risk (08/29/2021)   Overall Financial Resource Strain (CARDIA)    Difficulty of Paying Living Expenses: Hard  Food Insecurity: Food Insecurity Present (08/29/2021)   Hunger Vital Sign    Worried About Running Out of Food in the Last Year: Sometimes true    Ran Out of Food in the Last Year: Sometimes true  Transportation Needs: No Transportation Needs (08/29/2021)   PRAPARE - Hydrologist (Medical): No    Lack of Transportation (Non-Medical): No  Physical Activity: Inactive (08/29/2021)   Exercise Vital Sign     Days of Exercise per Week: 0 days    Minutes of Exercise per Session: 0 min  Stress: Stress Concern Present (08/29/2021)   [place]    Feeling of Stress : To some extent  Social Connections: Socially Isolated (08/29/2021)   Social Connection and Isolation Panel [NHANES]    Frequency of Communication with Friends and Family: Three times a week    Frequency of Social Gatherings with Friends and Family: More than three times a week    Attends Religious Services: Never    Marine scientist or Organizations: No    Attends Archivist Meetings: Never    Marital Status: Never married   Past Surgical History:  Procedure Laterality Date   BREAST BIOPSY Right 02/18/2021   Procedure: EVACUATION HEMATOMA RIGHT AXILLA;  Surgeon: [name], MD;  Location: [place];  Service: General;  Laterality: Right;   BREAST CYST EXCISION Right    Patient does not recall (2014 or 2015)   BREAST LUMPECTOMY WITH RADIOACTIVE SEED AND SENTINEL LYMPH NODE BIOPSY Right 02/07/2021   Procedure: RIGHT BREAST LUMPECTOMY WITH RADIOACTIVE SEED AND SEED TARGETED LYMPH NODE BIOPSY AND SENTINEL LYMPH NODE BIOPSY;  Surgeon: [name], MD;  Location: [place];  Service: General;  Laterality: Right;   CESAREAN SECTION     x2   IR IMAGING GUIDED PORT INSERTION  09/15/2020   IR REMOVAL TUN CV CATH W/O FL  09/15/2020   NODE DISSECTION Right 03/28/2021   Procedure: RIGHT AXILLARY LYMPH NODE DISSECTION;  Surgeon: [name], MD;  Location: [place];  Service: General;  Laterality: Right;   PORT-A-CATH REMOVAL Left 02/07/2021   Procedure: REMOVAL PORT-A-CATH;  Surgeon: [name], MD;  Location: [place];  Service: General;  Laterality: Left;   THYROIDECTOMY, PARTIAL     Past Medical History:  Diagnosis Date   Breast cancer ([place])    Diabetes mellitus  without complication ([place])    glipizide and actps; cbgs 200s fasting   Family history of colon cancer    History of goiter    History of radiation therapy    right breast/scv  05/10/21-07/01/21  Dr Gery Pray   Hyperlipidemia    Ht '5\' 4"'$  (1.626 m)   Wt 222 lb (100.7 kg)   BMI 38.11 kg/m   Opioid Risk Score:   Fall Risk Score:  `1  Depression screen [facility] 2/9     04/13/2022   10:11 AM 03/14/2022    9:50 AM 08/29/2021    9:00 AM 08/19/2021  9:18 AM  Depression screen PHQ 2/9  Decreased Interest '1 3  2  '$ Down, Depressed, Hopeless '1 3 1 2  '$ PHQ - 2 Score '2 6 1 4  '$ Altered sleeping  '3 1 3  '$ Tired, decreased energy  '3 2 2  '$ Change in appetite  3 0 3  Feeling bad or failure about yourself   3  3  Trouble concentrating  '3 3 3  '$ Moving slowly or fidgety/restless  3 0 1  Suicidal thoughts  1 0 0  PHQ-9 Score  '25 7 19  '$ Difficult doing work/chores  Very difficult      Review of Systems  Musculoskeletal:  Positive for neck pain.       Pain on right side of neck down to right hand  All other systems reviewed and are negative.      Objective:   Physical Exam  Gen: no distress, normal appearing HEENT: oral mucosa pink and moist, NCAT Cardio: Reg rate Chest: normal effort, normal rate of breathing Abd: soft, non-distended Ext: no edema Psych: pleasant, normal affect   Skin: intact Neuro: Alert and oriented, sensation intact in all 4 extremities, DTR normal and symmetric Musculoskeletal:  Strength 5/5 in LUE and B/L LE Strength at last 4/5 RUE limited by pain Limited active range of motion to about 90 degrees of the right shoulder Pain to palpation throughout RUE, greater in proximal arm Swelling in right upper extremity compared to left upper extremity Spurling's negative No paraspinal tenderness Pain with shoulder abduction, internal rotation and external rotation Tenderness over right trapezius Pain with wrist and elbow flexion and extension- improved from last  week Appears to be protecting her arm less than she did last visit  MRI shoulder R IMPRESSION: 1. Mild distal infraspinatus tendinosis. No evidence of rotator cuff tear. No evidence of labral tear. 2. Faint intramuscular edema within teres minor, consistent with low-grade muscle strain. 3. Mild soft tissue edema within the right axilla compatible with history of prior axillary lymph node dissection.     5  Assessment & Plan:   Right upper extremity pain. CPRS in differential- diagnosis complicated by edema due to lymph node dissection. She appears to have better movement in her arm today from last visit. Possibly component of frozen shoulder contributing.  Prior MRI shows infraspinatus tendinitis -DC  duloxetine 30 mg daily due to rash -Start elavil '25mg'$  QHS -Continue meloxicam -Discussed home exercise program, PT declines restart formal PT -Triple phase bone scan ordered     Mood disorder, patient reports mood is decreased due to her pain denies SI or HI -Elavil may provide benefit for her mood and sleep

## 2022-04-14 NOTE — Patient Instructions (Signed)
SURGICAL WAITING ROOM VISITATION Patients having surgery or a procedure may have no more than 2 support people in the waiting area - these visitors may rotate.   Children under the age of 55 must have an adult with them who is not the patient. If the patient needs to stay at the hospital during part of their recovery, the visitor guidelines for inpatient rooms apply. Pre-op nurse will coordinate an appropriate time for 1 support person to accompany patient in pre-op.  This support person may not rotate.    Please refer to the [facility] website for the visitor guidelines for Inpatients (after your surgery is over and you are in a regular room).       Your procedure is scheduled on:  04/26/2022    Report to [facility] Main Entrance    Report to admitting at   [phone]   Call this number if you have problems the morning of surgery [phone]   Do not eat food :After Midnight. Eat a light diet the day before surgery.  Avoid gas producing foods.    After Midnight you may have the following liquids until _ 0530_____ AM DAY OF SURGERY  Water Non-Citrus Juices (without pulp, NO RED) Carbonated Beverages Black Coffee (NO MILK/CREAM OR CREAMERS, sugar ok)  Clear Tea (NO MILK/CREAM OR CREAMERS, sugar ok) regular and decaf                             Plain Jell-O (NO RED)                                           Fruit ices (not with fruit pulp, NO RED)                                     Popsicles (NO RED)                                                               Sports drinks like Gatorade (NO RED)                   Oral Hygiene is also important to reduce your risk of infection.                                    Remember - BRUSH YOUR TEETH THE MORNING OF SURGERY WITH YOUR REGULAR TOOTHPASTE   Do NOT smoke after Midnight   Take these medicines the morning of surgery with A SIP OF WATER:  cymbalata, arimidex allegra if needed, flonase if needed [facility] -  Preparing for Surgery Before surgery, you can play an important role.  Because skin is not sterile, your skin needs to be as free of germs as possible.  You can reduce the number of germs on your skin by washing with CHG (chlorahexidine gluconate) soap before surgery.  CHG is an antiseptic cleaner which kills germs and bonds with the skin to continue killing germs even  after washing. Please DO NOT use if you have an allergy to CHG or antibacterial soaps.  If your skin becomes reddened/irritated stop using the CHG and inform your nurse when you arrive at Short Stay. Do not shave (including legs and underarms) for at least 48 hours prior to the first CHG shower.  You may shave your face/neck. Please follow these instructions carefully:  1.  Shower with CHG Soap the night before surgery and the  morning of Surgery.  2.  If you choose to wash your hair, wash your hair first as usual with your  normal  shampoo.  3.  After you shampoo, rinse your hair and body thoroughly to remove the  shampoo.                           4.  Use CHG as you would any other liquid soap.  You can apply chg directly  to the skin and wash                       Gently with a scrungie or clean washcloth.  5.  Apply the CHG Soap to your body ONLY FROM THE NECK DOWN.   Do not use on face/ open                           Wound or open sores. Avoid contact with eyes, ears mouth and genitals (private parts).                       Wash face,  Genitals (private parts) with your normal soap.             6.  Wash thoroughly, paying special attention to the area where your surgery  will be performed.  7.  Thoroughly rinse your body with warm water from the neck down.  8.  DO NOT shower/wash with your normal soap after using and rinsing off  the CHG Soap.                9.  Pat yourself dry with a clean towel.            10.  Wear clean pajamas.            11.  Place clean sheets on your bed the night of your first shower and do not  sleep  with pets. Day of Surgery : Do not apply any lotions/deodorants the morning of surgery.  Please wear clean clothes to the hospital/surgery center.  FAILURE TO FOLLOW THESE INSTRUCTIONS MAY RESULT IN THE CANCELLATION OF YOUR SURGERY PATIENT SIGNATURE_________________________________  NURSE SIGNATURE__________________________________  ________________________________________________________________________   DO NOT TAKE ANY ORAL DIABETIC MEDICATIONS DAY OF YOUR SURGERY  Bring CPAP mask and tubing day of surgery.                              You may not have any metal on your body including hair pins, jewelry, and body piercing             Do not wear make-up, lotions, powders, perfumes/cologne, or deodorant  Do not wear nail polish including gel and S&S, artificial/acrylic nails, or any other type of covering on natural nails including finger and toenails. If you have artificial nails, gel coating, etc. that needs to be removed by a  nail salon please have this removed prior to surgery or surgery may need to be canceled/ delayed if the surgeon/ anesthesia feels like they are unable to be safely monitored.   Do not shave  48 hours prior to surgery.               Men may shave face and neck.   Do not bring valuables to the hospital. [place].   Contacts, dentures or bridgework may not be worn into surgery.   Bring small overnight bag day of surgery.   DO NOT [employer]. PHARMACY WILL DISPENSE MEDICATIONS LISTED ON YOUR MEDICATION LIST TO YOU DURING YOUR ADMISSION [place]!    Patients discharged on the day of surgery will not be allowed to drive home.  Someone NEEDS to stay with you for the first 24 hours after anesthesia.   Special Instructions: Bring a copy of your healthcare power of attorney and living will documents         the day of surgery if you haven't scanned them before.               Please read over the following fact sheets you were given: IF YOU HAVE [place] [phone]

## 2022-04-17 ENCOUNTER — Telehealth: Payer: Self-pay

## 2022-04-17 ENCOUNTER — Encounter (HOSPITAL_COMMUNITY): Payer: Self-pay

## 2022-04-17 ENCOUNTER — Other Ambulatory Visit: Payer: Self-pay

## 2022-04-17 ENCOUNTER — Encounter (HOSPITAL_COMMUNITY)
Admission: RE | Admit: 2022-04-17 | Discharge: 2022-04-17 | Disposition: A | Discharge status: Home / Self Care | Payer: Medicaid Other | Source: Ambulatory Visit | Attending: Gynecologic Oncology | Admitting: Gynecologic Oncology

## 2022-04-17 VITALS — BP 140/82 | HR 88 | Temp 98.7°F | Resp 16 | Ht 64.0 in | Wt 218.0 lb

## 2022-04-17 DIAGNOSIS — C50411 Malignant neoplasm of upper-outer quadrant of right female breast: Secondary | ICD-10-CM | POA: Insufficient documentation

## 2022-04-17 DIAGNOSIS — Z01818 Encounter for other preprocedural examination: Secondary | ICD-10-CM | POA: Diagnosis present

## 2022-04-17 DIAGNOSIS — Z170 Estrogen receptor positive status [ER+]: Secondary | ICD-10-CM | POA: Insufficient documentation

## 2022-04-17 HISTORY — DX: Anemia, unspecified: D64.9

## 2022-04-17 HISTORY — DX: Anxiety disorder, unspecified: F41.9

## 2022-04-17 HISTORY — DX: Gastro-esophageal reflux disease without esophagitis: K21.9

## 2022-04-17 HISTORY — DX: Headache, unspecified: R51.9

## 2022-04-17 HISTORY — DX: Depression, unspecified: F32.A

## 2022-04-17 LAB — COMPREHENSIVE METABOLIC PANEL
ALT: 25 U/L (ref 0–44)
AST: 17 U/L (ref 15–41)
Albumin: 4 g/dL (ref 3.5–5.0)
Alkaline Phosphatase: 68 U/L (ref 38–126)
Anion gap: 10 (ref 5–15)
BUN: 12 mg/dL (ref 6–20)
CO2: 26 mmol/L (ref 22–32)
Calcium: 9.7 mg/dL (ref 8.9–10.3)
Chloride: 105 mmol/L (ref 98–111)
Creatinine, Ser: 0.4 mg/dL — ABNORMAL LOW (ref 0.44–1.00)
GFR, Estimated: >60 mL/min (ref >60)
Glucose, Bld: 245 mg/dL — ABNORMAL HIGH (ref 70–99)
Potassium: 4.1 mmol/L (ref 3.5–5.1)
Sodium: 141 mmol/L (ref 135–145)
Total Bilirubin: 0.5 mg/dL (ref 0.3–1.2)
Total Protein: 7.2 g/dL (ref 6.5–8.1)

## 2022-04-17 LAB — CBC
HCT: 40.2 % (ref 36.0–46.0)
Hemoglobin: 13.3 g/dL (ref 12.0–15.0)
MCH: 30 pg (ref 26.0–34.0)
MCHC: 33.1 g/dL (ref 30.0–36.0)
MCV: 90.5 fL (ref 80.0–100.0)
Platelets: 217 10*3/uL (ref 150–400)
RBC: 4.44 MIL/uL (ref 3.87–5.11)
RDW: 12.3 % (ref 11.5–15.5)
WBC: 6.6 10*3/uL (ref 4.0–10.5)
nRBC: 0 % (ref 0.0–0.2)

## 2022-04-17 LAB — HEMOGLOBIN A1C
Hgb A1c MFr Bld: 9.5 % — ABNORMAL HIGH (ref 4.8–5.6)
Mean Plasma Glucose: 225.95 mg/dL

## 2022-04-17 LAB — GLUCOSE, CAPILLARY: Glucose-Capillary: 254 mg/dL — ABNORMAL HIGH (ref 70–99)

## 2022-04-17 NOTE — Telephone Encounter (Signed)
Attempted to contact patient to review glucose results from today. Unable to contact patient, left message requesting return call.

## 2022-04-17 NOTE — Telephone Encounter (Signed)
Spoke with Melody Rodriguez and reviewed her glucose results from this morning (254). Per [name], NP advised patient a hemoglobin A1c is in process which will show a 3 month picture of blood sugars. If greater than 8, surgery will have to be postponed in order to get sugars better controlled and to decrease risks of complications.  Patient verbalized understanding and did not voice any questions.

## 2022-04-17 NOTE — Telephone Encounter (Signed)
Spoke with patient and reviewed A1C (9.5). Per [name], NP given this value, we recommend delaying surgery to allow time for glucose improvement. Ideally, A1C would need to be 8 or less in order to proceed with surgery since the procedure is not considered an emergency. When glucose is elevated, it increases risks for complications (poor wound healing, infections, etc) During surgery and after. Pre-op appointment with [name], NP for 04/20/22 has been cancelled.  Patient verbalizes understanding and reports she has an appointment with her PCP Aurelio Jew) tomorrow. Instructed patient to keep [place] updated as she works on lowering her blood sugars and to call with any needs.

## 2022-04-18 ENCOUNTER — Telehealth: Payer: Self-pay | Admitting: Hematology and Oncology

## 2022-04-18 ENCOUNTER — Encounter: Payer: Self-pay | Admitting: Hematology and Oncology

## 2022-04-18 ENCOUNTER — Other Ambulatory Visit (HOSPITAL_COMMUNITY): Payer: Self-pay

## 2022-04-18 MED ORDER — OZEMPIC (0.25 OR 0.5 MG/DOSE) 2 MG/3ML ~~LOC~~ SOPN
PEN_INJECTOR | SUBCUTANEOUS | 5 refills | Status: DC
  Filled 2022-04-18: qty 3, 56d supply, fill #0
  Filled 2022-05-15: qty 3, 56d supply, fill #1

## 2022-04-18 MED ORDER — GLIPIZIDE 10 MG PO TABS
ORAL_TABLET | ORAL | 2 refills | Status: DC
  Filled 2022-04-18: qty 60, 30d supply, fill #0
  Filled 2022-05-15: qty 60, 30d supply, fill #1
  Filled 2022-06-27: qty 60, 30d supply, fill #2

## 2022-04-18 NOTE — Telephone Encounter (Signed)
Scheduled appointment per 8/1 staff message. Left message.

## 2022-04-19 ENCOUNTER — Telehealth: Payer: Self-pay | Admitting: *Deleted

## 2022-04-19 ENCOUNTER — Encounter (HOSPITAL_COMMUNITY): Payer: Self-pay | Admitting: Physician Assistant

## 2022-04-19 NOTE — Telephone Encounter (Signed)
Received documents from [place] [facility] [place] for request of medical records - sent documents to [facility] by intra-office mail.

## 2022-04-20 ENCOUNTER — Ambulatory Visit: Payer: Medicaid Other

## 2022-04-20 ENCOUNTER — Encounter: Payer: Self-pay | Admitting: Physical Medicine & Rehabilitation

## 2022-04-20 ENCOUNTER — Ambulatory Visit: Payer: Medicaid Other | Admitting: Gynecologic Oncology

## 2022-04-21 MED ORDER — AMITRIPTYLINE HCL 25 MG PO TABS
25.0000 mg | ORAL_TABLET | Freq: Every day | ORAL | 2 refills | Status: DC
Start: 2022-04-21 — End: 2022-06-19
  Filled 2022-04-21: qty 30, 30d supply, fill #0
  Filled 2022-05-15: qty 30, 30d supply, fill #1

## 2022-04-21 NOTE — Addendum Note (Signed)
Addended by: Jennye Boroughs on: 04/21/2022 10:38 PM   Modules accepted: Orders

## 2022-04-22 ENCOUNTER — Other Ambulatory Visit (HOSPITAL_COMMUNITY): Payer: Self-pay

## 2022-04-25 ENCOUNTER — Other Ambulatory Visit (HOSPITAL_COMMUNITY): Payer: Self-pay

## 2022-04-26 ENCOUNTER — Ambulatory Visit (HOSPITAL_COMMUNITY): Admission: RE | Admit: 2022-04-26 | Payer: Medicaid Other | Source: Ambulatory Visit | Admitting: Gynecologic Oncology

## 2022-04-26 ENCOUNTER — Encounter (HOSPITAL_COMMUNITY): Admission: RE | Payer: Self-pay | Source: Ambulatory Visit

## 2022-04-26 DIAGNOSIS — C50411 Malignant neoplasm of upper-outer quadrant of right female breast: Secondary | ICD-10-CM

## 2022-04-26 DIAGNOSIS — Z170 Estrogen receptor positive status [ER+]: Secondary | ICD-10-CM

## 2022-04-26 LAB — TYPE AND SCREEN
ABO/RH(D): O POS
Antibody Screen: NEGATIVE

## 2022-04-26 SURGERY — SALPINGO-OOPHORECTOMY, BILATERAL, ROBOT-ASSISTED
Anesthesia: General | Laterality: Bilateral

## 2022-04-27 ENCOUNTER — Inpatient Hospital Stay: Payer: Medicaid Other | Attending: Hematology and Oncology

## 2022-04-27 ENCOUNTER — Other Ambulatory Visit: Payer: Self-pay

## 2022-04-27 VITALS — BP 143/87 | HR 82 | Temp 98.8°F | Resp 18

## 2022-04-27 DIAGNOSIS — C50411 Malignant neoplasm of upper-outer quadrant of right female breast: Secondary | ICD-10-CM | POA: Insufficient documentation

## 2022-04-27 DIAGNOSIS — Z5111 Encounter for antineoplastic chemotherapy: Secondary | ICD-10-CM | POA: Insufficient documentation

## 2022-04-27 DIAGNOSIS — Z170 Estrogen receptor positive status [ER+]: Secondary | ICD-10-CM | POA: Insufficient documentation

## 2022-04-27 DIAGNOSIS — Z923 Personal history of irradiation: Secondary | ICD-10-CM | POA: Insufficient documentation

## 2022-04-27 DIAGNOSIS — C773 Secondary and unspecified malignant neoplasm of axilla and upper limb lymph nodes: Secondary | ICD-10-CM | POA: Insufficient documentation

## 2022-04-27 DIAGNOSIS — Z79811 Long term (current) use of aromatase inhibitors: Secondary | ICD-10-CM | POA: Insufficient documentation

## 2022-04-27 DIAGNOSIS — Z87891 Personal history of nicotine dependence: Secondary | ICD-10-CM | POA: Insufficient documentation

## 2022-04-27 MED ORDER — GOSERELIN ACETATE 3.6 MG ~~LOC~~ IMPL
3.6000 mg | DRUG_IMPLANT | Freq: Once | SUBCUTANEOUS | Status: AC
  Administered 2022-04-27: 3.6 mg via SUBCUTANEOUS
  Filled 2022-04-27: qty 3.6

## 2022-05-08 ENCOUNTER — Ambulatory Visit: Payer: Medicaid Other | Attending: Adult Health

## 2022-05-08 VITALS — Wt 222.0 lb

## 2022-05-08 DIAGNOSIS — Z483 Aftercare following surgery for neoplasm: Secondary | ICD-10-CM | POA: Insufficient documentation

## 2022-05-08 NOTE — Therapy (Addendum)
OUTPATIENT PHYSICAL THERAPY SOZO SCREENING NOTE   Patient Name: Melody Rodriguez MRN: [id number] DOB:[birth year], 46 y.o., female Today's Date: 05/08/2022  PCP: Myrtie Neither, PA-C REFERRING PROVIDER: Wilber Bihari Cornett*   PT End of Session - 05/08/22 0835     Visit Number 10   # unchanged due to screen only   PT Start Time 0833    PT Stop Time 0837    PT Time Calculation (min) 4 min    Activity Tolerance Patient tolerated treatment well    Behavior During Therapy [facility] for tasks assessed/performed             Past Medical History:  Diagnosis Date   Anemia    Anxiety    Breast cancer ([place])    right breast cancer   Depression    Diabetes mellitus without complication ([place])    glipizide and actps; cbgs 200s fasting   Family history of colon cancer    GERD (gastroesophageal reflux disease)    Headache    History of goiter    History of radiation therapy    right breast/scv  05/10/21-07/01/21  Dr Gery Pray   Hyperlipidemia    Past Surgical History:  Procedure Laterality Date   BREAST BIOPSY Right 02/18/2021   Procedure: EVACUATION HEMATOMA RIGHT AXILLA;  Surgeon: [name], MD;  Location: [place];  Service: General;  Laterality: Right;   BREAST CYST EXCISION Right    Patient does not recall (2014 or 2015)   BREAST LUMPECTOMY WITH RADIOACTIVE SEED AND SENTINEL LYMPH NODE BIOPSY Right 02/07/2021   Procedure: RIGHT BREAST LUMPECTOMY WITH RADIOACTIVE SEED AND SEED TARGETED LYMPH NODE BIOPSY AND SENTINEL LYMPH NODE BIOPSY;  Surgeon: [name], MD;  Location: [place];  Service: General;  Laterality: Right;   CESAREAN SECTION     x2   IR IMAGING GUIDED PORT INSERTION  09/15/2020   IR REMOVAL TUN CV CATH W/O FL  09/15/2020   NODE DISSECTION Right 03/28/2021   Procedure: RIGHT AXILLARY LYMPH NODE DISSECTION;  Surgeon: [name], MD;  Location: [place];  Service: General;  Laterality: Right;    PORT-A-CATH REMOVAL Left 02/07/2021   Procedure: REMOVAL PORT-A-CATH;  Surgeon: [name], MD;  Location: [place];  Service: General;  Laterality: Left;   THYROIDECTOMY, PARTIAL     Patient Active Problem List   Diagnosis Date Noted   Gene mutation 03/03/2022   Type 2 diabetes mellitus ([place]) 01/24/2022   Pap smear for cervical cancer screening 08/19/2021   Amenorrhea, secondary 03/02/2021   Genetic testing 08/11/2020   Iron deficiency anemia due to chronic blood loss 08/04/2020   Family history of colon cancer    Malignant neoplasm of upper-outer quadrant of right breast in female, estrogen receptor positive ([place]) 07/28/2020    REFERRING DIAG: right breast cancer at risk for lymphedema  THERAPY DIAG:  Aftercare following surgery for neoplasm  PERTINENT HISTORY: Patient was diagnosed on 07/13/2020 with right grade 2 invasive ductal carcinoma breast cancer. It is ER/PR positive and HER2 negative with a Ki67 of 20%. Patient underwent neoadjuvant chemotherapy from 09/01/2020 - 01/14/2021. She had a right lumpectomy and sentinel node biopsy (1/1 nodes positive) on 02/07/2021. Hematoma evacuation on 02/18/2021. Axillary dissection on 03/28/2021 with 3/7 positive nodes. Iron deficiency. Radiation  05/10/2021-06/2021  Diagnosis of Type 2 diabetes in Nov. 2022.  Pt has had 2 steroid shots in right shoulder with only about 2 hours of relief.  She was advised  not to have any more steroid shots due to her Diabetes   PRECAUTIONS: right UE Lymphedema risk, None  SUBJECTIVE: Pt returns for her 3 month L-Dex screen.   PAIN:  Are you having pain? No  SOZO SCREENING: Patient was assessed today using the SOZO machine to determine the lymphedema index score. This was compared to her baseline score. It was determined that she is within the recommended range when compared to her baseline and no further action is needed at this time. She will continue SOZO screenings. These are done every  3 months for 2 years post operatively followed by every 6 months for 2 years, and then annually.   L-DEX FLOWSHEETS - 05/08/22 0800       L-DEX LYMPHEDEMA SCREENING   Measurement Type Unilateral    L-DEX MEASUREMENT EXTREMITY Upper Extremity    POSITION  Standing    DOMINANT SIDE Left    At Risk Side Left    BASELINE SCORE (UNILATERAL) 0.4    L-DEX SCORE (UNILATERAL) -5    VALUE CHANGE (UNILAT) -5.4              Otelia Limes, PTA 05/08/2022, 8:37 AM

## 2022-05-15 ENCOUNTER — Other Ambulatory Visit (HOSPITAL_COMMUNITY): Payer: Self-pay

## 2022-05-15 MED ORDER — HYDROXYZINE HCL 10 MG PO TABS
ORAL_TABLET | ORAL | 0 refills | Status: DC
  Filled 2022-05-15: qty 90, 30d supply, fill #0

## 2022-05-16 ENCOUNTER — Other Ambulatory Visit (HOSPITAL_COMMUNITY): Payer: Self-pay

## 2022-05-16 MED ORDER — NYSTATIN 100000 UNIT/GM EX POWD
CUTANEOUS | 1 refills | Status: DC
  Filled 2022-05-16: qty 30, 30d supply, fill #0
  Filled 2022-06-27: qty 30, 30d supply, fill #1

## 2022-05-19 ENCOUNTER — Other Ambulatory Visit (HOSPITAL_COMMUNITY): Payer: Self-pay

## 2022-05-23 ENCOUNTER — Other Ambulatory Visit (HOSPITAL_COMMUNITY): Payer: Self-pay

## 2022-05-23 ENCOUNTER — Encounter: Payer: Medicaid Other | Attending: Physical Medicine & Rehabilitation | Admitting: Physical Medicine & Rehabilitation

## 2022-05-23 ENCOUNTER — Encounter: Payer: Self-pay | Admitting: Hematology and Oncology

## 2022-05-23 ENCOUNTER — Encounter: Payer: Self-pay | Admitting: Physical Medicine & Rehabilitation

## 2022-05-23 VITALS — BP 127/78 | HR 85 | Ht 64.0 in | Wt 222.6 lb

## 2022-05-23 DIAGNOSIS — G479 Sleep disorder, unspecified: Secondary | ICD-10-CM | POA: Insufficient documentation

## 2022-05-23 DIAGNOSIS — G8929 Other chronic pain: Secondary | ICD-10-CM | POA: Diagnosis present

## 2022-05-23 DIAGNOSIS — M79601 Pain in right arm: Secondary | ICD-10-CM | POA: Diagnosis present

## 2022-05-23 DIAGNOSIS — G90511 Complex regional pain syndrome I of right upper limb: Secondary | ICD-10-CM | POA: Insufficient documentation

## 2022-05-23 MED ORDER — GABAPENTIN 100 MG PO CAPS
100.0000 mg | ORAL_CAPSULE | Freq: Three times a day (TID) | ORAL | 2 refills | Status: DC
  Filled 2022-05-23: qty 30, 10d supply, fill #0

## 2022-05-23 MED ORDER — GABAPENTIN 100 MG PO CAPS
100.0000 mg | ORAL_CAPSULE | Freq: Three times a day (TID) | ORAL | 2 refills | Status: DC
  Filled 2022-05-23: qty 90, 30d supply, fill #0

## 2022-05-23 NOTE — Progress Notes (Signed)
Subjective:    Patient ID: Melody Rodriguez, female    DOB: March 02, 1976, 46 y.o.   MRN: [id number]  HPI HPI 24/93 46 year old female with past medical history of diabetes mellitus, breast cancer status post lumpectomy with radioactive seeds 02/07/2021, breast biopsy 02/18/2021 and node dissection 03/28/2021.  Patient reports she began having pain in her right upper extremity after her surgeries for treatment of breast cancer.  She developed pain in her shoulder and the pain spread to her entire arm.  Her arm is very sensitive to touch and has increased swelling.  She reports it has appeared red and swollen at times.  She has difficulty flexing or abducting her shoulder.  She keeps the arm by her side with elbow flexed and wrist flexed.  She had worked with PT although she had to stop it due to pain.  She reports the function in her right arm improved after the physical therapy.  She also has pain when she looks to the right with her head.  She cannot sleep on her right side and uses pillows to avoid sensation to her right arm.  Mobic does not provide significant benefit, 2x shoulder injections did not help the pain.  She reports her mood is decreased due to the pain.  Interval history Melody Rodriguez is here for follow-up of her right upper extremity pain.  She reports that she has tried elevating and has not appreciated significant change in her pain at this point, however it is providing benefit to her sleep.  She continues to have swelling and pain throughout her right upper extremity worse at her shoulder.  She is trying to do stretching and exercises with it at home.  She is still unable to lift her arm above shoulder level.  She continues to use multiple pillows at night to help avoid any pressure and sensation to her arm.  She has not completed the triple phase bone scan.    Pain Inventory Average Pain 7 Pain Right Now 7 My pain is sharp, burning, stabbing, tingling, and aching  In the last 24 hours,  has pain interfered with the following? General activity 8 Relation with others 8 Enjoyment of life 10 What TIME of day is your pain at its worst? morning , daytime, evening, and night Sleep (in general) Poor  Pain is worse with: walking, bending, sitting, inactivity, and standing Pain improves with: medication Relief from Meds: 4  Family History  Problem Relation Age of Onset   Hypertension Mother    Colon cancer Sister 62   Cancer Maternal Aunt        unknown type dx late 59s   Diabetes Maternal Grandmother    Breast cancer Neg Hx    Ovarian cancer Neg Hx    Endometrial cancer Neg Hx    Pancreatic cancer Neg Hx    Prostate cancer Neg Hx    Social History   Socioeconomic History   Marital status: Single    Spouse name: Not on file   Number of children: 2   Years of education: Not on file   Highest education level: Some college, no degree  Occupational History   Not on file  Tobacco Use   Smoking status: Former    Packs/day: 1.00    Years: 22.00    Total pack years: 22.00    Types: Cigars, Cigarettes   Smokeless tobacco: Never   Tobacco comments:    quit 07/2020  Vaping Use   Vaping Use: Never  used  Substance and Sexual Activity   Alcohol use: Yes    Comment: occas   Drug use: Yes    Types: Marijuana    Comment: daily   Sexual activity: Not Currently  Other Topics Concern   Not on file  Social History Narrative   Not on file   Social Determinants of Health   Financial Resource Strain: High Risk (08/29/2021)   Overall Financial Resource Strain (CARDIA)    Difficulty of Paying Living Expenses: Hard  Food Insecurity: Food Insecurity Present (08/29/2021)   Hunger Vital Sign    Worried About Running Out of Food in the Last Year: Sometimes true    Ran Out of Food in the Last Year: Sometimes true  Transportation Needs: No Transportation Needs (08/29/2021)   PRAPARE - Hydrologist (Medical): No    Lack of Transportation  (Non-Medical): No  Physical Activity: Inactive (08/29/2021)   Exercise Vital Sign    Days of Exercise per Week: 0 days    Minutes of Exercise per Session: 0 min  Stress: Stress Concern Present (08/29/2021)   [place]    Feeling of Stress : To some extent  Social Connections: Socially Isolated (08/29/2021)   Social Connection and Isolation Panel [NHANES]    Frequency of Communication with Friends and Family: Three times a week    Frequency of Social Gatherings with Friends and Family: More than three times a week    Attends Religious Services: Never    Marine scientist or Organizations: No    Attends Archivist Meetings: Never    Marital Status: Never married   Past Surgical History:  Procedure Laterality Date   BREAST BIOPSY Right 02/18/2021   Procedure: EVACUATION HEMATOMA RIGHT AXILLA;  Surgeon: [name], MD;  Location: [place];  Service: General;  Laterality: Right;   BREAST CYST EXCISION Right    Patient does not recall (2014 or 2015)   BREAST LUMPECTOMY WITH RADIOACTIVE SEED AND SENTINEL LYMPH NODE BIOPSY Right 02/07/2021   Procedure: RIGHT BREAST LUMPECTOMY WITH RADIOACTIVE SEED AND SEED TARGETED LYMPH NODE BIOPSY AND SENTINEL LYMPH NODE BIOPSY;  Surgeon: [name], MD;  Location: [place];  Service: General;  Laterality: Right;   CESAREAN SECTION     x2   IR IMAGING GUIDED PORT INSERTION  09/15/2020   IR REMOVAL TUN CV CATH W/O FL  09/15/2020   NODE DISSECTION Right 03/28/2021   Procedure: RIGHT AXILLARY LYMPH NODE DISSECTION;  Surgeon: [name], MD;  Location: [place];  Service: General;  Laterality: Right;   PORT-A-CATH REMOVAL Left 02/07/2021   Procedure: REMOVAL PORT-A-CATH;  Surgeon: [name], MD;  Location: [place];  Service: General;  Laterality: Left;   THYROIDECTOMY, PARTIAL      Past Surgical History:  Procedure Laterality Date   BREAST BIOPSY Right 02/18/2021   Procedure: EVACUATION HEMATOMA RIGHT AXILLA;  Surgeon: [name], MD;  Location: [place];  Service: General;  Laterality: Right;   BREAST CYST EXCISION Right    Patient does not recall (2014 or 2015)   BREAST LUMPECTOMY WITH RADIOACTIVE SEED AND SENTINEL LYMPH NODE BIOPSY Right 02/07/2021   Procedure: RIGHT BREAST LUMPECTOMY WITH RADIOACTIVE SEED AND SEED TARGETED LYMPH NODE BIOPSY AND SENTINEL LYMPH NODE BIOPSY;  Surgeon: [name], MD;  Location: [place];  Service: General;  Laterality: Right;   CESAREAN SECTION  x2   IR IMAGING GUIDED PORT INSERTION  09/15/2020   IR REMOVAL TUN CV CATH W/O FL  09/15/2020   NODE DISSECTION Right 03/28/2021   Procedure: RIGHT AXILLARY LYMPH NODE DISSECTION;  Surgeon: [name], MD;  Location: [place];  Service: General;  Laterality: Right;   PORT-A-CATH REMOVAL Left 02/07/2021   Procedure: REMOVAL PORT-A-CATH;  Surgeon: [name], MD;  Location: [place];  Service: General;  Laterality: Left;   THYROIDECTOMY, PARTIAL     Past Medical History:  Diagnosis Date   Anemia    Anxiety    Breast cancer ([place])    right breast cancer   Depression    Diabetes mellitus without complication ([place])    glipizide and actps; cbgs 200s fasting   Family history of colon cancer    GERD (gastroesophageal reflux disease)    Headache    History of goiter    History of radiation therapy    right breast/scv  05/10/21-07/01/21  Dr Gery Pray   Hyperlipidemia    BP 127/78   Pulse 85   Ht '5\' 4"'$  (1.626 m)   Wt 222 lb 9.6 oz (101 kg)   SpO2 97%   BMI 38.21 kg/m   Opioid Risk Score:   Fall Risk Score:  `1  Depression screen [facility] 2/9     05/23/2022    9:38 AM 04/13/2022   10:11 AM 03/14/2022    9:50 AM 08/29/2021    9:00 AM 08/19/2021    9:18 AM  Depression screen PH[facility] 2/9   Decreased Interest 0 '1 3  2  '$ Down, Depressed, Hopeless 0 '1 3 1 2  '$ PHQ - 2 Score 0 '2 6 1 4  '$ Altered sleeping   '3 1 3  '$ Tired, decreased energy   '3 2 2  '$ Change in appetite   3 0 3  Feeling bad or failure about yourself    3  3  Trouble concentrating   '3 3 3  '$ Moving slowly or fidgety/restless   3 0 1  Suicidal thoughts   1 0 0  PHQ-9 Score   '25 7 19  '$ Difficult doing work/chores   Very difficult       Review of Systems  Constitutional: Negative.   HENT: Negative.    Eyes: Negative.   Respiratory: Negative.    Cardiovascular: Negative.   Gastrointestinal: Negative.   Endocrine: Negative.   Genitourinary: Negative.   Musculoskeletal: Negative.   Skin: Negative.   Allergic/Immunologic: Negative.   Neurological: Negative.   Hematological: Negative.   Psychiatric/Behavioral:  Positive for sleep disturbance.       Objective:   Physical Exam  Gen: no distress, normal appearing HEENT: oral mucosa pink and moist, NCAT Cardio: Reg rate Chest: normal effort, normal rate of breathing Abd: soft, non-distended Ext: no edema Psych: pleasant, normal affect   Skin: intact Neuro: Alert and oriented, sensation intact in all 4 extremities, DTR normal and symmetric Musculoskeletal:  Strength 5/5 in LUE and B/L LE Strength at least 4/5 RUE limited by pain Limited active range of motion to about 80 degrees abduction of the right shoulder, I am able to passively move her arm further however this causes significant pain. Pain with internal and external rotation of the shoulder Pain to palpation throughout RUE Allodynia in the right upper extremity appears to be improved from prior visit Mild swelling in right upper extremity compared to left upper extremity Pain with compression of the metacarpals right upper extremity  Spurling's negative No paraspinal tenderness Tenderness palpation right trapezius Pain with wrist and elbow flexion and extension She wears a elbow sleeve on her right  elbow   MRI shoulder R IMPRESSION: 1. Mild distal infraspinatus tendinosis. No evidence of rotator cuff tear. No evidence of labral tear. 2. Faint intramuscular edema within teres minor, consistent with low-grade muscle strain. 3. Mild soft tissue edema within the right axilla compatible with history of prior axillary lymph node dissection.      Assessment & Plan:   Right upper extremity pain. Suspicious for CPRS- diagnosis complicated by edema due to lymph node dissection. Suspect she also has component of frozen shoulder although this by itself wouldn't explain the entirely of her diffuse pain throughout her RUE.  Prior MRI shows infraspinatus tendinitis -Duloxetine 30 mg stopped previously due to rash -Continue elavil '25mg'$  QHS for now, dose limited by sedation, may consider decreasing to 10 mg if she becomes limited by sedation -Start gabapentin '100mg'$  TID -Continue meloxicam -Continue home exercise program, She declines restart formal PT -Triple phase bone scan not yet completed- provided number for call for scheduling     Mood disorder, Sleep disorder -Denies SI or HI -Elavil appears to be providing some benefit to sleep and possibly mood

## 2022-05-26 ENCOUNTER — Other Ambulatory Visit: Payer: Self-pay

## 2022-05-26 ENCOUNTER — Inpatient Hospital Stay: Payer: Medicaid Other | Attending: Hematology and Oncology

## 2022-05-26 VITALS — BP 130/91 | HR 100 | Temp 98.9°F | Resp 18

## 2022-05-26 DIAGNOSIS — Z5111 Encounter for antineoplastic chemotherapy: Secondary | ICD-10-CM | POA: Diagnosis present

## 2022-05-26 DIAGNOSIS — C773 Secondary and unspecified malignant neoplasm of axilla and upper limb lymph nodes: Secondary | ICD-10-CM | POA: Insufficient documentation

## 2022-05-26 DIAGNOSIS — Z79811 Long term (current) use of aromatase inhibitors: Secondary | ICD-10-CM | POA: Insufficient documentation

## 2022-05-26 DIAGNOSIS — Z923 Personal history of irradiation: Secondary | ICD-10-CM | POA: Insufficient documentation

## 2022-05-26 DIAGNOSIS — C50411 Malignant neoplasm of upper-outer quadrant of right female breast: Secondary | ICD-10-CM | POA: Insufficient documentation

## 2022-05-26 DIAGNOSIS — Z9221 Personal history of antineoplastic chemotherapy: Secondary | ICD-10-CM | POA: Diagnosis not present

## 2022-05-26 DIAGNOSIS — Z170 Estrogen receptor positive status [ER+]: Secondary | ICD-10-CM | POA: Diagnosis not present

## 2022-05-26 MED ORDER — GOSERELIN ACETATE 3.6 MG ~~LOC~~ IMPL
3.6000 mg | DRUG_IMPLANT | Freq: Once | SUBCUTANEOUS | Status: AC
  Administered 2022-05-26: 3.6 mg via SUBCUTANEOUS
  Filled 2022-05-26: qty 3.6

## 2022-05-31 ENCOUNTER — Encounter (HOSPITAL_COMMUNITY)
Admission: RE | Admit: 2022-05-31 | Discharge: 2022-05-31 | Disposition: A | Discharge status: Home / Self Care | Payer: Medicaid Other | Source: Ambulatory Visit | Attending: Physical Medicine & Rehabilitation | Admitting: Physical Medicine & Rehabilitation

## 2022-05-31 ENCOUNTER — Other Ambulatory Visit (HOSPITAL_COMMUNITY): Payer: Self-pay

## 2022-05-31 DIAGNOSIS — M79621 Pain in right upper arm: Secondary | ICD-10-CM | POA: Diagnosis present

## 2022-05-31 MED ORDER — TECHNETIUM TC 99M MEDRONATE IV KIT
19.2000 | PACK | Freq: Once | INTRAVENOUS | Status: AC
  Administered 2022-05-31: 19.2 via INTRAVENOUS

## 2022-06-02 ENCOUNTER — Other Ambulatory Visit (HOSPITAL_COMMUNITY): Payer: Self-pay

## 2022-06-02 MED ORDER — OZEMPIC (1 MG/DOSE) 4 MG/3ML ~~LOC~~ SOPN
1.0000 mg | PEN_INJECTOR | SUBCUTANEOUS | 2 refills | Status: DC
  Filled 2022-06-02: qty 3, 28d supply, fill #0
  Filled 2022-06-27: qty 3, 28d supply, fill #1

## 2022-06-02 MED ORDER — ESCITALOPRAM OXALATE 10 MG PO TABS
10.0000 mg | ORAL_TABLET | Freq: Every day | ORAL | 2 refills | Status: DC
  Filled 2022-06-02: qty 30, 30d supply, fill #0
  Filled 2022-06-27: qty 30, 30d supply, fill #1
  Filled 2022-08-07: qty 30, 30d supply, fill #2

## 2022-06-06 ENCOUNTER — Other Ambulatory Visit (HOSPITAL_COMMUNITY): Payer: Self-pay

## 2022-06-19 ENCOUNTER — Encounter: Payer: Self-pay | Admitting: Physical Medicine & Rehabilitation

## 2022-06-19 ENCOUNTER — Other Ambulatory Visit (HOSPITAL_COMMUNITY): Payer: Self-pay

## 2022-06-19 ENCOUNTER — Encounter: Payer: Medicaid Other | Attending: Physical Medicine & Rehabilitation | Admitting: Physical Medicine & Rehabilitation

## 2022-06-19 VITALS — BP 127/81 | HR 101 | Ht 64.0 in | Wt 217.2 lb

## 2022-06-19 DIAGNOSIS — G479 Sleep disorder, unspecified: Secondary | ICD-10-CM | POA: Diagnosis present

## 2022-06-19 DIAGNOSIS — G90511 Complex regional pain syndrome I of right upper limb: Secondary | ICD-10-CM | POA: Insufficient documentation

## 2022-06-19 MED ORDER — PREGABALIN 75 MG PO CAPS
75.0000 mg | ORAL_CAPSULE | Freq: Two times a day (BID) | ORAL | 2 refills | Status: DC
  Filled 2022-06-19: qty 60, 30d supply, fill #0

## 2022-06-19 NOTE — Progress Notes (Signed)
Subjective:    Patient ID: Melody Rodriguez, female    DOB: [birth year], 46 y.o.   MRN: [id number]  HPI  HPI 66/84 46 year old female with past medical history of diabetes mellitus, breast cancer status post lumpectomy with radioactive seeds 02/07/2021, breast biopsy 02/18/2021 and node dissection 03/28/2021.  Patient reports she began having pain in her right upper extremity after her surgeries for treatment of breast cancer.  She developed pain in her shoulder and the pain spread to her entire arm.  Her arm is very sensitive to touch and has increased swelling.  She reports it has appeared red and swollen at times.  She has difficulty flexing or abducting her shoulder.  She keeps the arm by her side with elbow flexed and wrist flexed.  She had worked with PT although she had to stop it due to pain.  She reports the function in her right arm improved after the physical therapy.  She also has pain when she looks to the right with her head.  She cannot sleep on her right side and uses pillows to avoid sensation to her right arm.  Mobic does not provide significant benefit, 2x shoulder injections did not help the pain.  She reports her mood is decreased due to the pain.   Interval history Melody Rodriguez is here for follow-up of her right upper extremity pain.  She reports pain is roughly similar to her last visit.  She is able to reach her arm a little bit higher to get things on shelves at home.  She still cannot lift above her shoulder level on her right.  She continues to have burning, tingling and stabbing pains in her right upper back, shoulder, upper arm, elbow and sometimes hand and wrist.  She is trying to use her arm for more activities at home however says this is difficult.  She is not interested in physical therapy at this time.  Patient reports she is taking gabapentin 100 mg 3 times a day.  She says this is causing her some mild sedation.   Pain Inventory Average Pain 7 Pain Right Now 7 My pain is  constant, sharp, burning, dull, stabbing, tingling, and aching  In the last 24 hours, has pain interfered with the following? General activity 7 Relation with others 7 Enjoyment of life 10 What TIME of day is your pain at its worst? morning , daytime, evening, and night Sleep (in general) Fair  Pain is worse with: walking, bending, sitting, inactivity, standing, and some activites Pain improves with:  nothing Relief from Meds: 0  Family History  Problem Relation Age of Onset   Hypertension Mother    Colon cancer Sister 62   Cancer Maternal Aunt        unknown type dx late 21s   Diabetes Maternal Grandmother    Breast cancer Neg Hx    Ovarian cancer Neg Hx    Endometrial cancer Neg Hx    Pancreatic cancer Neg Hx    Prostate cancer Neg Hx    Social History   Socioeconomic History   Marital status: Single    Spouse name: Not on file   Number of children: 2   Years of education: Not on file   Highest education level: Some college, no degree  Occupational History   Not on file  Tobacco Use   Smoking status: Former    Packs/day: 1.00    Years: 22.00    Total pack years: 22.00  Types: Cigars, Cigarettes   Smokeless tobacco: Never   Tobacco comments:    quit 07/2020  Vaping Use   Vaping Use: Never used  Substance and Sexual Activity   Alcohol use: Yes    Comment: occas   Drug use: Yes    Types: Marijuana    Comment: daily   Sexual activity: Not Currently  Other Topics Concern   Not on file  Social History Narrative   Not on file   Social Determinants of Health   Financial Resource Strain: High Risk (08/29/2021)   Overall Financial Resource Strain (CARDIA)    Difficulty of Paying Living Expenses: Hard  Food Insecurity: Food Insecurity Present (08/29/2021)   Hunger Vital Sign    Worried About Running Out of Food in the Last Year: Sometimes true    Ran Out of Food in the Last Year: Sometimes true  Transportation Needs: No Transportation Needs (08/29/2021)    PRAPARE - Hydrologist (Medical): No    Lack of Transportation (Non-Medical): No  Physical Activity: Inactive (08/29/2021)   Exercise Vital Sign    Days of Exercise per Week: 0 days    Minutes of Exercise per Session: 0 min  Stress: Stress Concern Present (08/29/2021)   [place]    Feeling of Stress : To some extent  Social Connections: Socially Isolated (08/29/2021)   Social Connection and Isolation Panel [NHANES]    Frequency of Communication with Friends and Family: Three times a week    Frequency of Social Gatherings with Friends and Family: More than three times a week    Attends Religious Services: Never    Marine scientist or Organizations: No    Attends Archivist Meetings: Never    Marital Status: Never married   Past Surgical History:  Procedure Laterality Date   BREAST BIOPSY Right 02/18/2021   Procedure: EVACUATION HEMATOMA RIGHT AXILLA;  Surgeon: [name], MD;  Location: [place];  Service: General;  Laterality: Right;   BREAST CYST EXCISION Right    Patient does not recall (2014 or 2015)   BREAST LUMPECTOMY WITH RADIOACTIVE SEED AND SENTINEL LYMPH NODE BIOPSY Right 02/07/2021   Procedure: RIGHT BREAST LUMPECTOMY WITH RADIOACTIVE SEED AND SEED TARGETED LYMPH NODE BIOPSY AND SENTINEL LYMPH NODE BIOPSY;  Surgeon: [name], MD;  Location: [place];  Service: General;  Laterality: Right;   CESAREAN SECTION     x2   IR IMAGING GUIDED PORT INSERTION  09/15/2020   IR REMOVAL TUN CV CATH W/O FL  09/15/2020   NODE DISSECTION Right 03/28/2021   Procedure: RIGHT AXILLARY LYMPH NODE DISSECTION;  Surgeon: [name], MD;  Location: [place];  Service: General;  Laterality: Right;   PORT-A-CATH REMOVAL Left 02/07/2021   Procedure: REMOVAL PORT-A-CATH;  Surgeon: [name], MD;  Location:  [place];  Service: General;  Laterality: Left;   THYROIDECTOMY, PARTIAL     Past Surgical History:  Procedure Laterality Date   BREAST BIOPSY Right 02/18/2021   Procedure: EVACUATION HEMATOMA RIGHT AXILLA;  Surgeon: [name], MD;  Location: [place];  Service: General;  Laterality: Right;   BREAST CYST EXCISION Right    Patient does not recall (2014 or 2015)   BREAST LUMPECTOMY WITH RADIOACTIVE SEED AND SENTINEL LYMPH NODE BIOPSY Right 02/07/2021   Procedure: RIGHT BREAST LUMPECTOMY WITH RADIOACTIVE SEED AND SEED TARGETED LYMPH NODE BIOPSY AND  SENTINEL LYMPH NODE BIOPSY;  Surgeon: [name], MD;  Location: [place];  Service: General;  Laterality: Right;   CESAREAN SECTION     x2   IR IMAGING GUIDED PORT INSERTION  09/15/2020   IR REMOVAL TUN CV CATH W/O FL  09/15/2020   NODE DISSECTION Right 03/28/2021   Procedure: RIGHT AXILLARY LYMPH NODE DISSECTION;  Surgeon: [name], MD;  Location: [place];  Service: General;  Laterality: Right;   PORT-A-CATH REMOVAL Left 02/07/2021   Procedure: REMOVAL PORT-A-CATH;  Surgeon: [name], MD;  Location: [place];  Service: General;  Laterality: Left;   THYROIDECTOMY, PARTIAL     Past Medical History:  Diagnosis Date   Anemia    Anxiety    Breast cancer ([place])    right breast cancer   Depression    Diabetes mellitus without complication ([place])    glipizide and actps; cbgs 200s fasting   Family history of colon cancer    GERD (gastroesophageal reflux disease)    Headache    History of goiter    History of radiation therapy    right breast/scv  05/10/21-07/01/21  Dr Gery Pray   Hyperlipidemia    BP 127/81   Pulse (!) 101   Ht '5\' 4"'$  (1.626 m)   Wt 217 lb 3.2 oz (98.5 kg)   SpO2 96%   BMI 37.28 kg/m   Opioid Risk Score:   Fall Risk Score:  `1  Depression screen [facility] 2/9     06/19/2022   10:46 AM 05/23/2022    9:38  AM 04/13/2022   10:11 AM 03/14/2022    9:50 AM 08/29/2021    9:00 AM 08/19/2021    9:18 AM  Depression screen PHQ 2/9  Decreased Interest 3 0 '1 3  2  '$ Down, Depressed, Hopeless 3 0 '1 3 1 2  '$ PHQ - 2 Score 6 0 '2 6 1 4  '$ Altered sleeping    '3 1 3  '$ Tired, decreased energy    '3 2 2  '$ Change in appetite    3 0 3  Feeling bad or failure about yourself     3  3  Trouble concentrating    '3 3 3  '$ Moving slowly or fidgety/restless    3 0 1  Suicidal thoughts    1 0 0  PHQ-9 Score    '25 7 19  '$ Difficult doing work/chores    Very difficult       Review of Systems  Constitutional: Negative.   HENT: Negative.    Eyes: Negative.   Respiratory: Negative.    Cardiovascular: Negative.   Gastrointestinal: Negative.   Endocrine: Negative.   Genitourinary: Negative.   Musculoskeletal:        Right neck shoulder arm   Skin: Negative.   Allergic/Immunologic: Negative.   Neurological: Negative.   Hematological: Negative.   Psychiatric/Behavioral:  Positive for dysphoric mood.   All other systems reviewed and are negative.      Objective:   Physical Exam      Gen: no distress, normal appearing HEENT: oral mucosa pink and moist, NCAT Cardio: Reg rate Chest: normal effort, normal rate of breathing Abd: soft, non-distended Ext: no edema Psych: pleasant, normal affect   Skin: intact Neuro: Alert and oriented, sensation intact in all 4 extremities, DTR normal and symmetric Musculoskeletal:  Strength 5/5 in LUE and B/L LE Strength at least 4/5 RUE limited by pain Limited active range of motion to  about 80 degrees abduction of the right shoulder, I am able to passively move her arm further about 110 degrees however this causes significant pain. Pain with internal and external rotation of the shoulder Pain to palpation throughout RUE particularly the shoulder, upper arm, elbow Allodynia in the right upper extremity appears to be improved from prior visit Mild swelling in right upper extremity  compared to left upper extremity No significant pain with compression of the metacarpals right upper extremity today Spurling's negative No paraspinal tenderness Tenderness palpation right trapezius Minimal pain today with right wrist flexion and extension however she did does report pain with elbow flexion and extension She wears a elbow sleeve on her right elbow   MRI shoulder R IMPRESSION: 1. Mild distal infraspinatus tendinosis. No evidence of rotator cuff tear. No evidence of labral tear. 2. Faint intramuscular edema within teres minor, consistent with low-grade muscle strain. 3. Mild soft tissue edema within the right axilla compatible with history of prior axillary lymph node dissection.    Assessment & Plan:    Right upper extremity pain. Suspicious for CPRS- diagnosis complicated by edema due to lymph node dissection. Suspect she also has component of frozen shoulder although this by itself wouldn't explain the entirely of her diffuse pain throughout her RUE.  Prior MRI shows infraspinatus tendinitis -Duloxetine 30 mg stopped previously due to rash -We will discontinue gabapentin due to sedation, she has stopped using amitriptyline as well -Continue meloxicam -Continue home exercise program, She declines restart formal PT -Triple phase bone scan was negative -We will order Lyrica 75 mg twice daily, hopefully she will be able to tolerate this better than the gabapentin with less sedation   Mood disorder, Sleep disorder -Denies SI or HI -She has stopped using Elavil and is now on Lexapro ordered by her PCP -she reports gabapentin did help her sleep, lyrica may provide some benefit but hopefully will not cause daytime sedation

## 2022-06-23 ENCOUNTER — Other Ambulatory Visit (HOSPITAL_COMMUNITY): Payer: Self-pay

## 2022-06-23 ENCOUNTER — Other Ambulatory Visit: Payer: Self-pay | Admitting: Hematology and Oncology

## 2022-06-23 ENCOUNTER — Inpatient Hospital Stay: Payer: Medicaid Other | Attending: Hematology and Oncology

## 2022-06-23 DIAGNOSIS — C50411 Malignant neoplasm of upper-outer quadrant of right female breast: Secondary | ICD-10-CM | POA: Diagnosis present

## 2022-06-23 DIAGNOSIS — Z79811 Long term (current) use of aromatase inhibitors: Secondary | ICD-10-CM | POA: Diagnosis not present

## 2022-06-23 DIAGNOSIS — Z923 Personal history of irradiation: Secondary | ICD-10-CM | POA: Insufficient documentation

## 2022-06-23 DIAGNOSIS — C773 Secondary and unspecified malignant neoplasm of axilla and upper limb lymph nodes: Secondary | ICD-10-CM | POA: Insufficient documentation

## 2022-06-23 DIAGNOSIS — Z5111 Encounter for antineoplastic chemotherapy: Secondary | ICD-10-CM | POA: Insufficient documentation

## 2022-06-23 DIAGNOSIS — Z170 Estrogen receptor positive status [ER+]: Secondary | ICD-10-CM | POA: Insufficient documentation

## 2022-06-23 MED ORDER — GOSERELIN ACETATE 3.6 MG ~~LOC~~ IMPL
3.6000 mg | DRUG_IMPLANT | Freq: Once | SUBCUTANEOUS | Status: AC
  Administered 2022-06-23: 3.6 mg via SUBCUTANEOUS
  Filled 2022-06-23: qty 3.6

## 2022-06-23 NOTE — Patient Instructions (Signed)
Goserelin Implant What is this medication? GOSERELIN (GOE se rel in) treats prostate cancer and breast cancer. It works by decreasing levels of the hormones testosterone and estrogen in the body. This prevents prostate and breast cancer cells from spreading or growing. It may also be used to treat endometriosis. This is a condition where the tissue that lines the uterus grows outside the uterus. It works by decreasing the amount of estrogen your body makes, which reduces heavy bleeding and pain. It can also be used to help thin the lining of the uterus before a surgery used to prevent or reduce heavy periods. This medicine may be used for other purposes; ask your health care provider or pharmacist if you have questions. COMMON BRAND NAME(S): Zoladex, Zoladex 3-Month What should I tell my care team before I take this medication? They need to know if you have any of these conditions: Bone problems Diabetes Heart disease History of irregular heartbeat or rhythm An unusual or allergic reaction to goserelin, other medications, foods, dyes, or preservatives Pregnant or trying to get pregnant Breastfeeding How should I use this medication? This medication is injected under the skin. It is given by your care team in a hospital or clinic setting. Talk to your care team about the use of this medication in children. Special care may be needed. Overdosage: If you think you have taken too much of this medicine contact a poison control center or emergency room at once. NOTE: This medicine is only for you. Do not share this medicine with others. What if I miss a dose? Keep appointments for follow-up doses. It is important not to miss your dose. Call your care team if you are unable to keep an appointment. What may interact with this medication? Do not take this medication with any of the following: Cisapride Dronedarone Pimozide Thioridazine This medication may also interact with the following: Other  medications that cause heart rhythm changes This list may not describe all possible interactions. Give your health care provider a list of all the medicines, herbs, non-prescription drugs, or dietary supplements you use. Also tell them if you smoke, drink alcohol, or use illegal drugs. Some items may interact with your medicine. What should I watch for while using this medication? Visit your care team for regular checks on your progress. Your symptoms may appear to get worse during the first weeks of this therapy. Tell your care team if your symptoms do not start to get better or if they get worse after this time. Using this medication for a long time may weaken your bones. If you smoke or frequently drink alcohol you may increase your risk of bone loss. A family history of osteoporosis, chronic use of medications for seizures (convulsions), or corticosteroids can also increase your risk of bone loss. The risk of bone fractures may be increased. Talk to your care team about your bone health. This medication may increase blood sugar. The risk may be higher in patients who already have diabetes. Ask your care team what you can do to lower your risk of diabetes while taking this medication. This medication should stop regular monthly menstruation in women. Tell your care team if you continue to menstruate. Talk to your care team if you wish to become pregnant or think you might be pregnant. This medication can cause serious birth defects if taken during pregnancy or for 12 weeks after stopping treatment. Talk to your care team about reliable forms of contraception. Do not breastfeed while taking this   medication. This medication may cause infertility. Talk to your care team if you are concerned about your fertility. What side effects may I notice from receiving this medication? Side effects that you should report to your care team as soon as possible: Allergic reactions--skin rash, itching, hives, swelling  of the face, lips, tongue, or throat Change in the amount of urine Heart attack--pain or tightness in the chest, shoulders, arms, or jaw, nausea, shortness of breath, cold or clammy skin, feeling faint or lightheaded Heart rhythm changes--fast or irregular heartbeat, dizziness, feeling faint or lightheaded, chest pain, trouble breathing High blood sugar (hyperglycemia)--increased thirst or amount of urine, unusual weakness or fatigue, blurry vision High calcium level--increased thirst or amount of urine, nausea, vomiting, confusion, unusual weakness or fatigue, bone pain Pain, redness, irritation, or bruising at the injection site Severe back pain, numbness or weakness of the hands, arms, legs, or feet, loss of coordination, loss of bowel or bladder control Stroke--sudden numbness or weakness of the face, arm, or leg, trouble speaking, confusion, trouble walking, loss of balance or coordination, dizziness, severe headache, change in vision Swelling and pain of the tumor site or lymph nodes Trouble passing urine Side effects that usually do not require medical attention (report to your care team if they continue or are bothersome): Change in sex drive or performance Headache Hot flashes Rapid or extreme change in emotion or mood Sweating Swelling of the ankles, hands, or feet Unusual vaginal discharge, itching, or odor This list may not describe all possible side effects. Call your doctor for medical advice about side effects. You may report side effects to FDA at 1-800-FDA-1088. Where should I keep my medication? This medication is given in a hospital or clinic. It will not be stored at home. NOTE: This sheet is a summary. It may not cover all possible information. If you have questions about this medicine, talk to your doctor, pharmacist, or health care provider.  2023 Elsevier/Gold Standard (2022-01-18 00:00:00)  

## 2022-06-27 ENCOUNTER — Other Ambulatory Visit (HOSPITAL_COMMUNITY): Payer: Self-pay

## 2022-06-28 ENCOUNTER — Other Ambulatory Visit (HOSPITAL_COMMUNITY): Payer: Self-pay

## 2022-07-11 ENCOUNTER — Other Ambulatory Visit (HOSPITAL_COMMUNITY): Payer: Self-pay

## 2022-07-11 MED ORDER — OZEMPIC (2 MG/DOSE) 8 MG/3ML ~~LOC~~ SOPN
2.0000 mg | PEN_INJECTOR | SUBCUTANEOUS | 3 refills | Status: DC
  Filled 2022-07-11: qty 3, 28d supply, fill #0
  Filled 2022-08-07: qty 3, 28d supply, fill #1
  Filled 2022-09-12: qty 3, 28d supply, fill #2
  Filled 2022-10-15: qty 3, 28d supply, fill #3

## 2022-07-17 ENCOUNTER — Other Ambulatory Visit (HOSPITAL_COMMUNITY): Payer: Self-pay

## 2022-07-24 ENCOUNTER — Encounter: Payer: Self-pay | Admitting: Hematology and Oncology

## 2022-07-25 ENCOUNTER — Other Ambulatory Visit: Payer: Self-pay

## 2022-07-31 ENCOUNTER — Encounter: Payer: Medicaid Other | Attending: Physical Medicine & Rehabilitation | Admitting: Physical Medicine & Rehabilitation

## 2022-07-31 ENCOUNTER — Encounter: Payer: Self-pay | Admitting: Physical Medicine & Rehabilitation

## 2022-07-31 ENCOUNTER — Other Ambulatory Visit (HOSPITAL_COMMUNITY): Payer: Self-pay

## 2022-07-31 VITALS — BP 141/83 | HR 91 | Ht 64.0 in | Wt 210.6 lb

## 2022-07-31 DIAGNOSIS — G90511 Complex regional pain syndrome I of right upper limb: Secondary | ICD-10-CM | POA: Insufficient documentation

## 2022-07-31 DIAGNOSIS — G479 Sleep disorder, unspecified: Secondary | ICD-10-CM | POA: Diagnosis not present

## 2022-07-31 DIAGNOSIS — F0630 Mood disorder due to known physiological condition, unspecified: Secondary | ICD-10-CM | POA: Insufficient documentation

## 2022-07-31 DIAGNOSIS — M25511 Pain in right shoulder: Secondary | ICD-10-CM | POA: Insufficient documentation

## 2022-07-31 MED ORDER — PREGABALIN 100 MG PO CAPS
100.0000 mg | ORAL_CAPSULE | Freq: Two times a day (BID) | ORAL | 3 refills | Status: DC
Start: 2022-07-31 — End: 2022-10-03
  Filled 2022-07-31: qty 60, 30d supply, fill #0
  Filled 2022-09-12: qty 60, 30d supply, fill #1

## 2022-07-31 NOTE — Progress Notes (Signed)
Subjective:    Patient ID: Melody Rodriguez, female    DOB: [birth year], 46 y.o.   MRN: [id number]  HPI  HPI 7/33 46 year old female with past medical history of diabetes mellitus, breast cancer status post lumpectomy with radioactive seeds 02/07/2021, breast biopsy 02/18/2021 and node dissection 03/28/2021.  Patient reports she began having pain in her right upper extremity after her surgeries for treatment of breast cancer.  She developed pain in her shoulder and the pain spread to her entire arm.  Her arm is very sensitive to touch and has increased swelling.  She reports it has appeared red and swollen at times.  She has difficulty flexing or abducting her shoulder.  She keeps the arm by her side with elbow flexed and wrist flexed.  She had worked with PT although she had to stop it due to pain.  She reports the function in her right arm improved after the physical therapy.  She also has pain when she looks to the right with her head.  She cannot sleep on her right side and uses pillows to avoid sensation to her right arm.  Mobic does not provide significant benefit, 2x shoulder injections did not help the pain.  She reports her mood is decreased due to the pain.   Visit 06/19/22 [name] is here for follow-up of her right upper extremity pain.  She reports pain is roughly similar to her last visit.  She is able to reach her arm a little bit higher to get things on shelves at home.  She still cannot lift above her shoulder level on her right.  She continues to have burning, tingling and stabbing pains in her right upper back, shoulder, upper arm, elbow and sometimes hand and wrist.  She is trying to use her arm for more activities at home however says this is difficult.  She is not interested in physical therapy at this time.  Patient reports she is taking gabapentin 100 mg 3 times a day.  She says this is causing her some mild sedation.    Interval History  [name] is here for follow-up for her  chronic pain in her right upper extremity.  Pain continues to be throughout her whole right arm.  She has noticed improvement with Lyrica.  She is not having any significant side effects with the medication. She works as an Mining engineer. She is using her RUE for activities when she is able to do this.   Pain Inventory Average Pain 8 Pain Right Now 6 My pain is sharp, burning, dull, stabbing, tingling, and aching  In the last 24 hours, has pain interfered with the following? General activity 6 Relation with others 6 Enjoyment of life 6 What TIME of day is your pain at its worst? morning , daytime, evening, and night Sleep (in general) Poor  Pain is worse with: walking, bending, sitting, inactivity, standing, and some activites Pain improves with:  no answer Relief from Meds: 7  Family History  Problem Relation Age of Onset   Hypertension Mother    Colon cancer Sister 9   Cancer Maternal Aunt        unknown type dx late 68s   Diabetes Maternal Grandmother    Breast cancer Neg Hx    Ovarian cancer Neg Hx    Endometrial cancer Neg Hx    Pancreatic cancer Neg Hx    Prostate cancer Neg Hx    Social History   Socioeconomic History  Marital status: Single    Spouse name: Not on file   Number of children: 2   Years of education: Not on file   Highest education level: Some college, no degree  Occupational History   Not on file  Tobacco Use   Smoking status: Former    Packs/day: 1.00    Years: 22.00    Total pack years: 22.00    Types: Cigars, Cigarettes   Smokeless tobacco: Never   Tobacco comments:    quit 07/2020  Vaping Use   Vaping Use: Never used  Substance and Sexual Activity   Alcohol use: Yes    Comment: occas   Drug use: Yes    Types: Marijuana    Comment: daily   Sexual activity: Not Currently  Other Topics Concern   Not on file  Social History Narrative   Not on file   Social Determinants of Health   Financial Resource Strain: High Risk (08/29/2021)    Overall Financial Resource Strain (CARDIA)    Difficulty of Paying Living Expenses: Hard  Food Insecurity: Food Insecurity Present (08/29/2021)   Hunger Vital Sign    Worried About Running Out of Food in the Last Year: Sometimes true    Ran Out of Food in the Last Year: Sometimes true  Transportation Needs: No Transportation Needs (08/29/2021)   PRAPARE - Hydrologist (Medical): No    Lack of Transportation (Non-Medical): No  Physical Activity: Inactive (08/29/2021)   Exercise Vital Sign    Days of Exercise per Week: 0 days    Minutes of Exercise per Session: 0 min  Stress: Stress Concern Present (08/29/2021)   [place]    Feeling of Stress : To some extent  Social Connections: Socially Isolated (08/29/2021)   Social Connection and Isolation Panel [NHANES]    Frequency of Communication with Friends and Family: Three times a week    Frequency of Social Gatherings with Friends and Family: More than three times a week    Attends Religious Services: Never    Marine scientist or Organizations: No    Attends Archivist Meetings: Never    Marital Status: Never married   Past Surgical History:  Procedure Laterality Date   BREAST BIOPSY Right 02/18/2021   Procedure: EVACUATION HEMATOMA RIGHT AXILLA;  Surgeon: [name], MD;  Location: [facility];  Service: General;  Laterality: Right;   BREAST CYST EXCISION Right    Patient does not recall (2014 or 2015)   BREAST LUMPECTOMY WITH RADIOACTIVE SEED AND SENTINEL LYMPH NODE BIOPSY Right 02/07/2021   Procedure: RIGHT BREAST LUMPECTOMY WITH RADIOACTIVE SEED AND SEED TARGETED LYMPH NODE BIOPSY AND SENTINEL LYMPH NODE BIOPSY;  Surgeon: [name], MD;  Location: [facility];  Service: General;  Laterality: Right;   CESAREAN SECTION     x2   IR IMAGING GUIDED PORT INSERTION  09/15/2020   IR  REMOVAL TUN CV CATH W/O FL  09/15/2020   NODE DISSECTION Right 03/28/2021   Procedure: RIGHT AXILLARY LYMPH NODE DISSECTION;  Surgeon: [name], MD;  Location: [place];  Service: General;  Laterality: Right;   PORT-A-CATH REMOVAL Left 02/07/2021   Procedure: REMOVAL PORT-A-CATH;  Surgeon: [name], MD;  Location: [facility];  Service: General;  Laterality: Left;   THYROIDECTOMY, PARTIAL     Past Surgical History:  Procedure Laterality Date   BREAST BIOPSY Right 02/18/2021  Procedure: EVACUATION HEMATOMA RIGHT AXILLA;  Surgeon: [name], MD;  Location: [facility];  Service: General;  Laterality: Right;   BREAST CYST EXCISION Right    Patient does not recall (2014 or 2015)   BREAST LUMPECTOMY WITH RADIOACTIVE SEED AND SENTINEL LYMPH NODE BIOPSY Right 02/07/2021   Procedure: RIGHT BREAST LUMPECTOMY WITH RADIOACTIVE SEED AND SEED TARGETED LYMPH NODE BIOPSY AND SENTINEL LYMPH NODE BIOPSY;  Surgeon: [name], MD;  Location: [facility];  Service: General;  Laterality: Right;   CESAREAN SECTION     x2   IR IMAGING GUIDED PORT INSERTION  09/15/2020   IR REMOVAL TUN CV CATH W/O FL  09/15/2020   NODE DISSECTION Right 03/28/2021   Procedure: RIGHT AXILLARY LYMPH NODE DISSECTION;  Surgeon: [name], MD;  Location: [facility];  Service: General;  Laterality: Right;   PORT-A-CATH REMOVAL Left 02/07/2021   Procedure: REMOVAL PORT-A-CATH;  Surgeon: [name], MD;  Location: [facility];  Service: General;  Laterality: Left;   THYROIDECTOMY, PARTIAL     Past Medical History:  Diagnosis Date   Anemia    Anxiety    Breast cancer ([place])    right breast cancer   Depression    Diabetes mellitus without complication ([place])    glipizide and actps; cbgs 200s fasting   Family history of colon cancer    GERD (gastroesophageal reflux disease)    Headache    History  of goiter    History of radiation therapy    right breast/scv  05/10/21-07/01/21  Dr Gery Pray   Hyperlipidemia    BP (!) 141/83   Pulse 91   Ht '5\' 4"'$  (1.626 m)   Wt 210 lb 9.6 oz (95.5 kg)   SpO2 96%   BMI 36.15 kg/m   Opioid Risk Score:   Fall Risk Score:  `1  Depression screen [facility] 2/9     07/31/2022   10:36 AM 06/19/2022   10:46 AM 05/23/2022    9:38 AM 04/13/2022   10:11 AM 03/14/2022    9:50 AM 08/29/2021    9:00 AM 08/19/2021    9:18 AM  Depression screen PHQ 2/9  Decreased Interest 3 3 0 '1 3  2  '$ Down, Depressed, Hopeless 3 3 0 '1 3 1 2  '$ PHQ - 2 Score 6 6 0 '2 6 1 4  '$ Altered sleeping     '3 1 3  '$ Tired, decreased energy     '3 2 2  '$ Change in appetite     3 0 3  Feeling bad or failure about yourself      3  3  Trouble concentrating     '3 3 3  '$ Moving slowly or fidgety/restless     3 0 1  Suicidal thoughts     1 0 0  PHQ-9 Score     '25 7 19  '$ Difficult doing work/chores     Very difficult      Review of Systems  Constitutional: Negative.   HENT: Negative.    Eyes: Negative.   Respiratory: Negative.    Cardiovascular: Negative.   Gastrointestinal: Negative.   Endocrine: Negative.   Genitourinary: Negative.   Musculoskeletal:        Right neck shoulder and arm  Skin: Negative.   Allergic/Immunologic: Negative.   Neurological: Negative.   Hematological: Negative.   Psychiatric/Behavioral:  Positive for dysphoric mood.   All other systems reviewed and are negative.  Objective:   Physical Exam   Gen: no distress, normal appearing HEENT: oral mucosa pink and moist, NCAT Chest: normal effort, normal rate of breathing Abd: soft, non-distended Ext: no edema Psych: pleasant, normal affect   Skin: intact Neuro: Alert and oriented, sensation intact in all 4 extremities, DTR normal and symmetric Musculoskeletal:  Strength 5/5 in LUE and B/L LE Strength at least 4/5 RUE limited by pain Limited active range of motion to about 80 degrees abduction of the  right shoulder, I am able to passively move her arm further about 110 degrees however this causes significant pain. Pain with internal and external rotation of the shoulder and limited range of motion Pain to palpation throughout RUE particularly the shoulder, upper arm, elbow Allodynia in right upper extremity continues to be improved Mild swelling in right upper extremity compared to left upper extremity No significant pain with compression of the metacarpals right upper extremity today No paraspinal tenderness    MRI shoulder R IMPRESSION: 1. Mild distal infraspinatus tendinosis. No evidence of rotator cuff tear. No evidence of labral tear. 2. Faint intramuscular edema within teres minor, consistent with low-grade muscle strain. 3. Mild soft tissue edema within the right axilla compatible with history of prior axillary lymph node dissection.      Assessment & Plan:   Right upper extremity pain. Suspicious for CPRS versus neuropathic pain related to cancer treatment, diagnosis complicated by edema due to lymph node dissection. Suspect she also has component of frozen shoulder although this by itself wouldn't explain the entirely of her diffuse pain throughout her RUE.  Prior MRI shows infraspinatus tendinitis -Duloxetine 30 mg stopped previously due to rash -Gabapentin caused sedation in the past she has stopped using amitriptyline as well -Continue meloxicam -Continue home exercise program, She declines restart formal PT -Triple phase bone scan was negative -Increase Lyrica to 100 mg twice a day   Mood disorder, Sleep disorder -Denies SI or HI -She has stopped using Elavil and is now on Lexapro ordered by her PCP -She had a rash with duloxetine but venlafaxine could be an option. -If Lyrica causes sedation, could consider higher dose at night than in the day, may be beneficial to sleep

## 2022-08-02 ENCOUNTER — Telehealth: Payer: Self-pay | Admitting: Hematology and Oncology

## 2022-08-02 NOTE — Telephone Encounter (Signed)
Per 11/15 in basket, pt has been scheduled and message has been left

## 2022-08-04 NOTE — Progress Notes (Unsigned)
08/07/2022 SHIVANI BARRANTES [id number] 08-Dec-1975  Referring provider: Myrtie Neither, PA-C Primary GI doctor: Dr. Lorenso Courier  ASSESSMENT AND PLAN:   GERD with intermittent dysphagia, worse with ozempic but was happening prior EGD with possible dilatation to evaluate for stenosis, tumor, erosive/infectious esophagititis, and EOE, and gastric cancer with CTNNA1 gene mutation.   Will add on pepcid, lifestyle discussed Will check for H pylori I discussed risks of EGD with patient today, including risk of sedation, bleeding or perforation.  Patient provides understanding and gave verbal consent to proceed.  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  Will set up for EGD to evaluate, will test for H pylori and treat if positive Per guidelines appears should be every year for EGD She does not smoke or drink.   Family history of colon cancer colonoscopy with Dr. Patsey Berthold 08/04/2022 for screening purposes EGD was not done and she would like to transfer here for GI.  Mild diverticulosis, non bleeding internal hemorrhoids. Adequate prep.  Recall per Dr. Lorenso Courier recommendations   Type 2 diabetes mellitus without complication, without long-term current use of insulin ([place]) Discussed GLP1 with the patient, mechanism of action and how this can worsen and/or cause nausea by causing gastroparesis.   Gastroparesis diet given to the patient.  Patient should be instructed to hold this medications if dose falls within 2-3 days of endoscopic procedure, due to increased risk of retained gastric contents.    Patient Care Team: Berna Bue as PCP - General (Physician Assistant) [name], MD as Consulting Physician (General Surgery) Nicholas Lose, MD as Consulting Physician (Hematology and Oncology) Gery Pray, MD as Consulting Physician (Radiation Oncology)  HISTORY OF PRESENT ILLNESS: 46  y.o. 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  Sister with colon cancer age 52-34, currently she is 70 did not have genetic cancer. Father with lupus.  No other family history of colon, gastric, or breast cancer.  She does not smoke for 22 years.  She has occ ETOH every 3 months.   She has been on ozempic for 2-3 months, off actos. Has had some nausea with it, sugars are doing well. Has lost 26 lbs.  Patient reports GERD prior to ozempic, will take omeprazole as needed.  Patient has intermittent dysphagia. She has nausea with vomiting if she eats too much. Denies melena.  Patient has a BM every day but has been eating less and been on ozempic so has had constipation. Feels bloated. Denies hematochezia.   She surprisingly had colonoscopy with Dr. Patsey Berthold 08/04/2022 for screening purposes, EGD was not done and she would like to transfer here for GI.  Mild diverticulosis, non bleeding internal hemorrhoids. Adequate prep.  Recall 5 years.    She  reports that she has quit smoking. Her smoking use included cigars and cigarettes. She has a 22.00 pack-year smoking history. She has never used smokeless tobacco. She reports current alcohol use. She reports current drug use. Drug: Marijuana.  Current Medications:   Current Outpatient Medications (Endocrine & Metabolic):    glipiZIDE (GLUCOTROL) 10 MG tablet, Take one tablet (10 mg dose) by mouth 2 (two) times daily.   Semaglutide, 1 MG/DOSE, (OZEMPIC, 1 MG/DOSE,) 4 MG/3ML SOPN, Inject 1 mg into the skin  once a week.   Semaglutide, 2 MG/DOSE, (OZEMPIC, 2 MG/DOSE,) 8 MG/3ML SOPN, Inject 2 mg into the skin once a week.  Current Outpatient Medications (Cardiovascular):    atorvastatin (LIPITOR) 10 MG tablet, Take one tablet (10 mg dose) by mouth daily.  Current Outpatient  Medications (Respiratory):    fexofenadine (ALLEGRA) 180 MG tablet, Take 180 mg by mouth daily as needed for allergies.   fluticasone (FLONASE) 50 MCG/ACT nasal spray, Place 1 spray into both nostrils daily as needed for allergies.  Current Outpatient Medications (Analgesics):    acetaminophen (TYLENOL) 500 MG tablet, Take 2,000 mg by mouth every 8 (eight) hours as needed for moderate pain.   Current Outpatient Medications (Other):    ACCU-CHEK GUIDE test strip, USE TO MONITOR BLOOD GLUCOSE 3 TIME(S) DAILY   Accu-Chek Softclix Lancets lancets, 3 (three) times daily.   anastrozole (ARIMIDEX) 1 MG tablet, Take 1 tablet (1 mg total) by mouth daily.   Blood Glucose Monitoring Suppl (ACCU-CHEK GUIDE ME) w/Device KIT, See admin instructions.   escitalopram (LEXAPRO) 10 MG tablet, Take 1 tablet (10 mg total) by mouth daily.   hydrOXYzine (ATARAX) 10 MG tablet, Take 1 tablet by mouth 3 (three) times a day as needed for anxiety.   Multiple Vitamins-Minerals (MULTIVITAMIN WOMEN) TABS, Take 1 tablet by mouth daily.   nystatin (MYCOSTATIN/NYSTOP) powder, Apply topically 2 (two) times a day as needed.   pregabalin (LYRICA) 100 MG capsule, Take 1 capsule (100 mg total) by mouth 2 (two) times daily.   triamcinolone cream (KENALOG) 0.1 %, Apply topically 2 (two) times a day as needed.  Medical History:  Past Medical History:  Diagnosis Date   Anemia    Anxiety    Breast cancer ([place])    right breast cancer   Depression    Diabetes mellitus without complication (HCC)    glipizide and actps; cbgs 200s fasting   Family history of colon cancer    GERD (gastroesophageal reflux disease)    Headache    History of goiter    History of radiation therapy    right breast/scv  05/10/21-07/01/21  Dr Gery Pray   Hyperlipidemia    Monoallelic mutation of CTNNA3 gene    Recurrent major depressive disorder ([place])    Allergies:  Allergies  Allergen Reactions   Cephalexin Hives and Rash   Duloxetine Rash    Tramadol Other (See Comments)    Makes patient feel "weird" Other reaction(s): Other Makes patient feel "weird"   Latex Itching and Swelling   Naproxen Itching     Surgical History:  She  has a past surgical history that includes Cesarean section; Breast cyst excision (Right); Thyroidectomy, partial; IR Removal Tun Cv Cath W/O FL (09/15/2020); IR IMAGING GUIDED PORT INSERTION (09/15/2020); Breast lumpectomy with radioactive seed and sentinel lymph node biopsy (Right, 02/07/2021); Port-a-cath removal (Left, 02/07/2021); Breast biopsy (Right, 02/18/2021); and Node dissection (Right, 03/28/2021). Family History:  Her family history includes Cancer in her maternal aunt; Colon cancer (age of onset: 37) in her sister; Diabetes in her maternal grandmother; Hypertension in her mother.  REVIEW OF SYSTEMS  : All other systems reviewed and negative except where noted in the History of Present Illness.  PHYSICAL EXAM: BP 126/82   Pulse 93   Ht _0  (1.626 m)   Wt 210 lb (95.3 kg)   BMI 36.05 kg/m  General:   Pleasant, well developed female in no acute distress Head:   Normocephalic and atraumatic. Eyes:  sclerae anicteric,conjunctive pink  Heart:   regular rate and rhythm Pulm:  Clear anteriorly; no wheezing Abdomen:   Soft, Obese AB, Active bowel sounds. No tenderness . Without guarding and Without rebound, No organomegaly appreciated. Rectal: Not evaluated Extremities:  Without edema. Msk: Symmetrical without gross deformities. Peripheral pulses intact.  Neurologic:  Alert and  oriented x4;  No focal deficits.  Skin:   Dry and intact without significant lesions or rashes. Psychiatric:  Cooperative. Normal mood and affect.    Vladimir Crofts, PA-C 10:11 AM

## 2022-08-07 ENCOUNTER — Ambulatory Visit: Payer: Medicaid Other | Admitting: Physician Assistant

## 2022-08-07 ENCOUNTER — Encounter: Payer: Self-pay | Admitting: Physician Assistant

## 2022-08-07 ENCOUNTER — Other Ambulatory Visit (HOSPITAL_COMMUNITY): Payer: Self-pay

## 2022-08-07 ENCOUNTER — Other Ambulatory Visit: Payer: Medicaid Other

## 2022-08-07 VITALS — BP 126/82 | HR 93 | Ht 64.0 in | Wt 210.0 lb

## 2022-08-07 DIAGNOSIS — Z170 Estrogen receptor positive status [ER+]: Secondary | ICD-10-CM

## 2022-08-07 DIAGNOSIS — K219 Gastro-esophageal reflux disease without esophagitis: Secondary | ICD-10-CM

## 2022-08-07 DIAGNOSIS — Z800 Family history of malignant neoplasm of digestive organs: Secondary | ICD-10-CM

## 2022-08-07 DIAGNOSIS — C50411 Malignant neoplasm of upper-outer quadrant of right female breast: Secondary | ICD-10-CM | POA: Diagnosis not present

## 2022-08-07 DIAGNOSIS — Z1589 Genetic susceptibility to other disease: Secondary | ICD-10-CM

## 2022-08-07 DIAGNOSIS — E119 Type 2 diabetes mellitus without complications: Secondary | ICD-10-CM

## 2022-08-07 DIAGNOSIS — R1319 Other dysphagia: Secondary | ICD-10-CM | POA: Diagnosis not present

## 2022-08-07 MED ORDER — GLIPIZIDE 10 MG PO TABS
10.0000 mg | ORAL_TABLET | Freq: Two times a day (BID) | ORAL | 2 refills | Status: DC
Start: 2022-08-07 — End: 2022-11-20
  Filled 2022-08-07: qty 60, 30d supply, fill #0
  Filled 2022-09-12: qty 60, 30d supply, fill #1
  Filled 2022-10-15: qty 60, 30d supply, fill #2

## 2022-08-07 MED ORDER — FAMOTIDINE 40 MG PO TABS
40.0000 mg | ORAL_TABLET | Freq: Every day | ORAL | 2 refills | Status: DC
  Filled 2022-08-07: qty 30, 30d supply, fill #0
  Filled 2022-09-12: qty 30, 30d supply, fill #1
  Filled 2022-10-15: qty 30, 30d supply, fill #2

## 2022-08-07 NOTE — Patient Instructions (Addendum)
Your provider has requested that you go to the basement level for lab work before leaving today. Press "B" on the elevator. The lab is located at the first door on the left as you exit the elevator.  Can start on pepcid once at night for GERD Avoid spicy and acidic foods Avoid fatty foods Limit your intake of coffee, tea, alcohol, and carbonated drinks Work to maintain a healthy weight Keep the head of the bed elevated at least 3 inches with blocks or a wedge pillow if you are having any nighttime symptoms Stay upright for 2 hours after eating Avoid meals and snacks three to four hours before bedtime  Gastroparesis Please do small frequent meals like 4-6 meals a day.  Eat and drink liquids at separate times.  Avoid high fiber foods, cook your vegetables, avoid high fat food.  Suggest spreading protein throughout the day (greek yogurt, glucerna, soft meat, milk, eggs) Choose soft foods that you can mash with a fork When you are more symptomatic, change to pureed foods foods and liquids.  Consider reading "Living well with Gastroparesis" by Lambert Keto Gastroparesis is a condition in which food takes longer than normal to empty from the stomach. This condition is also known as delayed gastric emptying. It is usually a long-term (chronic) condition. There is no cure, but there are treatments and things that you can do at home to help relieve symptoms. Treating the underlying condition that causes gastroparesis can also help relieve symptoms What are the causes? In many cases, the cause of this condition is not known. Possible causes include: A hormone (endocrine) disorder, such as hypothyroidism or diabetes. A nervous system disease, such as Parkinson's disease or multiple sclerosis. Cancer, infection, or surgery that affects the stomach or vagus nerve. The vagus nerve runs from your chest, through your neck, and to the lower part of your brain. A connective tissue disorder, such as  scleroderma. Certain medicines. What increases the risk? You are more likely to develop this condition if: You have certain disorders or diseases. These may include: An endocrine disorder. An eating disorder. Amyloidosis. Scleroderma. Parkinson's disease. Multiple sclerosis. Cancer or infection of the stomach or the vagus nerve. You have had surgery on your stomach or vagus nerve. You take certain medicines. You are female. What are the signs or symptoms? Symptoms of this condition include: Feeling full after eating very little or a loss of appetite. Nausea, vomiting, or heartburn. Bloating of your abdomen. Inconsistent blood sugar (glucose) levels on blood tests. Unexplained weight loss. Acid from the stomach coming up into the esophagus (gastroesophageal reflux). Sudden tightening (spasm) of the stomach, which can be painful. Symptoms may come and go. Some people may not notice any symptoms. How is this diagnosed? This condition is diagnosed with tests, such as: Tests that check how long it takes food to move through the stomach and intestines. These tests include: Upper gastrointestinal (GI) series. For this test, you drink a liquid that shows up well on X-rays, and then X-rays are taken of your intestines. Gastric emptying scintigraphy. For this test, you eat food that contains a small amount of radioactive material, and then scans are taken. Wireless capsule GI monitoring system. For this test, you swallow a pill (capsule) that records information about how foods and fluid move through your stomach. Gastric manometry. For this test, a tube is passed down your throat and into your stomach to measure electrical and muscular activity. Endoscopy. For this test, a long, thin tube with  a camera and light on the end is passed down your throat and into your stomach to check for problems in your stomach lining. Ultrasound. This test uses sound waves to create images of the inside of your  body. This can help rule out gallbladder disease or pancreatitis as a cause of your symptoms. How is this treated? There is no cure for this condition, but treatment and home care may relieve symptoms. Treatment may include: Treating the underlying cause. Managing your symptoms by making changes to your diet and exercise habits. Taking medicines to control nausea and vomiting and to stimulate stomach muscles. Getting food through a feeding tube in the hospital. This may be done in severe cases. Having surgery to insert a device called a gastric electrical stimulator into your body. This device helps improve stomach emptying and control nausea and vomiting. Follow these instructions at home: Take over-the-counter and prescription medicines only as told by your health care provider. Follow instructions from your health care provider about eating or drinking restrictions. Your health care provider may recommend that you: Eat smaller meals more often. Eat low-fat foods. Eat low-fiber forms of high-fiber foods. For example, eat cooked vegetables instead of raw vegetables. Have only liquid foods instead of solid foods. Liquid foods are easier to digest. Drink enough fluid to keep your urine pale yellow. Exercise as often as told by your health care provider. Keep all follow-up visits. This is important. Contact a health care provider if you: Notice that your symptoms do not improve with treatment. Have new symptoms. Get help right away if you: Have severe pain in your abdomen that does not improve with treatment. Have nausea that is severe or does not go away. Vomit every time you drink fluids. Summary Gastroparesis is a long-term (chronic) condition in which food takes longer than normal to empty from the stomach. Symptoms include nausea, vomiting, heartburn, bloating of your abdomen, and loss of appetite. Eating smaller portions, low-fat foods, and low-fiber forms of high-fiber foods may help  you manage your symptoms. Get help right away if you have severe pain in your abdomen. This information is not intended to replace advice given to you by your health care provider. Make sure you discuss any questions you have with your health care provider. Document Revised: 01/12/2020 Document Reviewed: 01/12/2020 Elsevier Patient Education  2021 [employer].   Diverticulosis Diverticulosis is a condition that develops when small pouches (diverticula) form in the wall of the large intestine (colon). The colon is where water is absorbed and stool (feces) is formed. The pouches form when the inside layer of the colon pushes through weak spots in the outer layers of the colon. You may have a few pouches or many of them. The pouches usually do not cause problems unless they become inflamed or infected. When this happens, the condition is called diverticulitis- this is left lower quadrant pain, diarrhea, fever, chills, nausea or vomiting.  If this occurs please call the office or go to the hospital. Sometimes these patches without inflammation can also have painless bleeding associated with them, if this happens please call the office or go to the hospital. Preventing constipation and increasing fiber can help reduce diverticula and prevent complications. Even if you feel you have a high-fiber diet, suggest getting on Benefiber or Cirtracel 2 times daily.

## 2022-08-07 NOTE — Progress Notes (Signed)
I agree with the assessment and plan as outlined by Ms. Silverio Lay. Her semaglutide (since it is a once weekly injection) will need to be held a week before her EGD procedure. During EGD due to the CTNNA1 gene mutation I will plan to obtain 30 biopsies from the stomach, with 5 biopsies taken from each of the following anatomic zones: prepyloric area, antrum, transitional zone, body, fundus, and cardia. Recall for next colonoscopy should be in 07/2027 due to family history of colon cancer.

## 2022-08-09 ENCOUNTER — Inpatient Hospital Stay: Payer: Medicaid Other | Attending: Hematology and Oncology

## 2022-08-09 DIAGNOSIS — Z79811 Long term (current) use of aromatase inhibitors: Secondary | ICD-10-CM | POA: Insufficient documentation

## 2022-08-09 DIAGNOSIS — Z5111 Encounter for antineoplastic chemotherapy: Secondary | ICD-10-CM | POA: Insufficient documentation

## 2022-08-09 DIAGNOSIS — Z79818 Long term (current) use of other agents affecting estrogen receptors and estrogen levels: Secondary | ICD-10-CM | POA: Insufficient documentation

## 2022-08-09 DIAGNOSIS — C50411 Malignant neoplasm of upper-outer quadrant of right female breast: Secondary | ICD-10-CM | POA: Insufficient documentation

## 2022-08-09 DIAGNOSIS — C773 Secondary and unspecified malignant neoplasm of axilla and upper limb lymph nodes: Secondary | ICD-10-CM | POA: Insufficient documentation

## 2022-08-09 DIAGNOSIS — Z170 Estrogen receptor positive status [ER+]: Secondary | ICD-10-CM | POA: Insufficient documentation

## 2022-08-14 ENCOUNTER — Ambulatory Visit: Payer: Medicaid Other

## 2022-08-15 ENCOUNTER — Inpatient Hospital Stay: Payer: Medicaid Other

## 2022-08-15 VITALS — BP 133/74 | HR 91 | Temp 99.3°F | Resp 20

## 2022-08-15 DIAGNOSIS — Z170 Estrogen receptor positive status [ER+]: Secondary | ICD-10-CM

## 2022-08-15 DIAGNOSIS — C50411 Malignant neoplasm of upper-outer quadrant of right female breast: Secondary | ICD-10-CM | POA: Diagnosis present

## 2022-08-15 DIAGNOSIS — Z5111 Encounter for antineoplastic chemotherapy: Secondary | ICD-10-CM | POA: Diagnosis not present

## 2022-08-15 DIAGNOSIS — Z79811 Long term (current) use of aromatase inhibitors: Secondary | ICD-10-CM | POA: Diagnosis not present

## 2022-08-15 DIAGNOSIS — C773 Secondary and unspecified malignant neoplasm of axilla and upper limb lymph nodes: Secondary | ICD-10-CM | POA: Diagnosis present

## 2022-08-15 DIAGNOSIS — Z79818 Long term (current) use of other agents affecting estrogen receptors and estrogen levels: Secondary | ICD-10-CM | POA: Diagnosis not present

## 2022-08-15 MED ORDER — GOSERELIN ACETATE 3.6 MG ~~LOC~~ IMPL
3.6000 mg | DRUG_IMPLANT | Freq: Once | SUBCUTANEOUS | Status: AC
  Administered 2022-08-15: 3.6 mg via SUBCUTANEOUS
  Filled 2022-08-15: qty 3.6

## 2022-08-16 ENCOUNTER — Telehealth: Payer: Self-pay | Admitting: *Deleted

## 2022-08-16 NOTE — Telephone Encounter (Signed)
[employer] Claim completed at this time by this nurse.  Form to collaborative pick up bin for provider review, signature and return.

## 2022-08-23 ENCOUNTER — Encounter: Payer: Self-pay | Admitting: Gynecologic Oncology

## 2022-08-23 ENCOUNTER — Telehealth: Payer: Self-pay

## 2022-08-23 NOTE — Telephone Encounter (Signed)
Contacted pt to follow up on her interest of having surgery. (Normal HgbA1c at 6.9 one month ago) Pt states she would like to have it in January. Follow up appointment with [name] is scheduled for 12/19.  [name] NP aware.

## 2022-08-24 ENCOUNTER — Encounter: Payer: Self-pay | Admitting: Hematology and Oncology

## 2022-08-28 ENCOUNTER — Ambulatory Visit: Payer: Medicaid Other | Attending: Adult Health

## 2022-08-28 VITALS — Wt 207.2 lb

## 2022-08-28 DIAGNOSIS — Z483 Aftercare following surgery for neoplasm: Secondary | ICD-10-CM | POA: Insufficient documentation

## 2022-08-28 NOTE — Therapy (Signed)
OUTPATIENT PHYSICAL THERAPY SOZO SCREENING NOTE   Patient Name: Melody Rodriguez MRN: [id number] DOB:[birth year], 46 y.o., female Today's Date: 08/28/2022  PCP: Myrtie Neither, PA-C REFERRING PROVIDER: Wilber Bihari Cornett*   PT End of Session - 08/28/22 [phone]     Visit Number 10   #  unchanged due to screen only   PT Start Time 0935    PT Stop Time 0940    PT Time Calculation (min) 5 min    Activity Tolerance Patient tolerated treatment well    Behavior During Therapy [facility] for tasks assessed/performed             Past Medical History:  Diagnosis Date   Anemia    Anxiety    Breast cancer ([place])    right breast cancer   Depression    Diabetes mellitus without complication ([place])    glipizide and actps; cbgs 200s fasting   Family history of colon cancer    GERD (gastroesophageal reflux disease)    Headache    History of goiter    History of radiation therapy    right breast/scv  05/10/21-07/01/21  Dr Gery Pray   Hyperlipidemia    Monoallelic mutation of CTNNA3 gene    Recurrent major depressive disorder [facility])    Past Surgical History:  Procedure Laterality Date   BREAST BIOPSY Right 02/18/2021   Procedure: EVACUATION HEMATOMA RIGHT AXILLA;  Surgeon: [name], MD;  Location: [place];  Service: General;  Laterality: Right;   BREAST CYST EXCISION Right    Patient does not recall (2014 or 2015)   BREAST LUMPECTOMY WITH RADIOACTIVE SEED AND SENTINEL LYMPH NODE BIOPSY Right 02/07/2021   Procedure: RIGHT BREAST LUMPECTOMY WITH RADIOACTIVE SEED AND SEED TARGETED LYMPH NODE BIOPSY AND SENTINEL LYMPH NODE BIOPSY;  Surgeon: [name], MD;  Location: [place];  Service: General;  Laterality: Right;   CESAREAN SECTION     x2   IR IMAGING GUIDED PORT INSERTION  09/15/2020   IR REMOVAL TUN CV CATH W/O FL  09/15/2020   NODE DISSECTION Right 03/28/2021   Procedure: RIGHT AXILLARY LYMPH NODE DISSECTION;  Surgeon: [name], MD;  Location: [place];  Service: General;  Laterality: Right;   PORT-A-CATH REMOVAL Left 02/07/2021   Procedure: REMOVAL PORT-A-CATH;  Surgeon: [name], MD;  Location: [place];  Service: General;  Laterality: Left;   THYROIDECTOMY, PARTIAL     Patient Active Problem List   Diagnosis Date Noted   Gene mutation 03/03/2022   Type 2 diabetes mellitus ([place]) 01/24/2022   Pap smear for cervical cancer screening 08/19/2021   Amenorrhea, secondary 03/02/2021   Genetic testing 08/11/2020   Iron deficiency anemia due to chronic blood loss 08/04/2020   Family history of colon cancer    Malignant neoplasm of upper-outer quadrant of right breast in female, estrogen receptor positive ([place]) 07/28/2020    REFERRING DIAG: right breast cancer at risk for lymphedema  THERAPY DIAG: Aftercare following surgery for neoplasm  PERTINENT HISTORY: Patient was diagnosed on 07/13/2020 with right grade 2 invasive ductal carcinoma breast cancer. It is ER/PR positive and HER2 negative with a Ki67 of 20%. Patient underwent neoadjuvant chemotherapy from 09/01/2020 - 01/14/2021. She had a right lumpectomy and sentinel node biopsy (1/1 nodes positive) on 02/07/2021. Hematoma evacuation on 02/18/2021. Axillary dissection on 03/28/2021 with 3/7 positive nodes. Iron deficiency. Radiation  05/10/2021-06/2021  Diagnosis of Type 2 diabetes in Nov. 2022.  Pt has had 2  steroid shots in right shoulder with only about 2 hours of relief.  She was advised not to have any more steroid shots due to her Diabetes   PRECAUTIONS: right UE Lymphedema risk, None  SUBJECTIVE: Pt returns for her 3 month L-Dex screen.   PAIN:  Are you having pain? No  SOZO SCREENING: Patient was assessed today using the SOZO machine to determine the lymphedema index score. This was compared to her baseline score. It was determined that she is within the recommended range when compared to her baseline and no  further action is needed at this time. She will continue SOZO screenings. These are done every 3 months for 2 years post operatively followed by every 6 months for 2 years, and then annually.   L-DEX FLOWSHEETS - 08/28/22 0900       L-DEX LYMPHEDEMA SCREENING   Measurement Type Unilateral    L-DEX MEASUREMENT EXTREMITY Upper Extremity    POSITION  Standing    DOMINANT SIDE Left    At Risk Side Left    BASELINE SCORE (UNILATERAL) 0.4    L-DEX SCORE (UNILATERAL) -2.2    VALUE CHANGE (UNILAT) -2.6              Otelia Limes, PTA 08/28/2022, 9:39 AM

## 2022-09-01 ENCOUNTER — Other Ambulatory Visit (HOSPITAL_COMMUNITY): Payer: Self-pay

## 2022-09-01 ENCOUNTER — Encounter: Payer: Medicaid Other | Admitting: Physical Medicine & Rehabilitation

## 2022-09-01 ENCOUNTER — Encounter: Payer: Self-pay | Admitting: Internal Medicine

## 2022-09-01 ENCOUNTER — Ambulatory Visit (AMBULATORY_SURGERY_CENTER): Payer: Medicaid Other | Admitting: Internal Medicine

## 2022-09-01 VITALS — BP 138/79 | HR 88 | Temp 97.5°F | Resp 15 | Ht 64.0 in | Wt 210.0 lb

## 2022-09-01 DIAGNOSIS — K319 Disease of stomach and duodenum, unspecified: Secondary | ICD-10-CM

## 2022-09-01 DIAGNOSIS — C161 Malignant neoplasm of fundus of stomach: Secondary | ICD-10-CM | POA: Diagnosis not present

## 2022-09-01 DIAGNOSIS — K222 Esophageal obstruction: Secondary | ICD-10-CM

## 2022-09-01 DIAGNOSIS — R1310 Dysphagia, unspecified: Secondary | ICD-10-CM | POA: Diagnosis not present

## 2022-09-01 DIAGNOSIS — K449 Diaphragmatic hernia without obstruction or gangrene: Secondary | ICD-10-CM | POA: Diagnosis not present

## 2022-09-01 MED ORDER — SODIUM CHLORIDE 0.9 % IV SOLN: 500.0000 mL | INTRAVENOUS | Status: DC

## 2022-09-01 MED ORDER — OMEPRAZOLE 40 MG PO CPDR
40.0000 mg | DELAYED_RELEASE_CAPSULE | Freq: Every day | ORAL | 1 refills | Status: DC
  Filled 2022-09-01: qty 30, 30d supply, fill #0
  Filled 2022-09-12 – 2022-10-15 (×2): qty 30, 30d supply, fill #1

## 2022-09-01 MED ORDER — LIDOCAINE VISCOUS HCL 2 % MT SOLN
15.0000 mL | OROMUCOSAL | 0 refills | Status: DC | PRN
  Filled 2022-09-01: qty 100, 3d supply, fill #0

## 2022-09-01 NOTE — Progress Notes (Signed)
Patient reports no change to health or medications with exception of acute left wrist pain that started last night.  No swelling or warmth noted at site.Patient wishes to proceed but states she will get it checked out later today.

## 2022-09-01 NOTE — Progress Notes (Signed)
Pt in recovery with monitors in place, VSS. Report given to receiving RN. Bite guard was placed with pt awake to ensure comfort. No dental or soft tissue damage noted. 

## 2022-09-01 NOTE — Patient Instructions (Addendum)
- Discharge patient to home (with escort).                           - Await pathology results.                           - Use Prilosec (omeprazole) 40 mg PO daily for 8                            weeks.                           - Return to GI clinic in 6 weeks. Thursday 10-19-22 at 8:50 am                           - Repeat upper endoscopy in 1 year for surveillance.                           - The findings and recommendations were discussed                            with the patient.  Dilation diet- see handout Follow up endoscopy in 1 year   YOU HAD AN ENDOSCOPIC PROCEDURE TODAY AT [facility]:   Refer to the procedure report that was given to you for any specific questions about what was found during the examination.  If the procedure report does not answer your questions, please call your gastroenterologist to clarify.  If you requested that your care partner not be given the details of your procedure findings, then the procedure report has been included in a sealed envelope for you to review at your convenience later.  YOU SHOULD EXPECT: Some feelings of bloating in the abdomen. Passage of more gas than usual.  Walking can help get rid of the air that was put into your GI tract during the procedure and reduce the bloating.   Please Note:  You might notice some irritation and congestion in your nose or some drainage.  This is from the oxygen used during your procedure.  There is no need for concern and it should clear up in a day or so.  SYMPTOMS TO REPORT IMMEDIATELY:  Following upper endoscopy (EGD)  Vomiting of blood or coffee ground material  New chest pain or pain under the shoulder blades  Painful or persistently difficult swallowing  New shortness of breath  Fever of 100F or higher  Black, tarry-looking stools  For urgent or emergent issues, a gastroenterologist can be reached at any hour by calling [phone]. Do not use MyChart messaging for urgent  concerns.    DIET: dilation diet- see handout!!.  Drink plenty of fluids but you should avoid alcoholic beverages for 24 hours.  ACTIVITY:  You should plan to take it easy for the rest of today and you should NOT DRIVE or use heavy machinery until tomorrow (because of the sedation medicines used during the test).    FOLLOW UP: Our staff will call the number listed on your records the next business day following your procedure.  We will call around 7:15- 8:00 am to check on you and address any questions or concerns that you may have regarding  the information given to you following your procedure. If we do not reach you, we will leave a message.     If any biopsies were taken you will be contacted by phone or by letter within the next 1-3 weeks.  Please call us at [phone] if you have not heard about the biopsies in 3 weeks.    SIGNATURES/CONFIDENTIALITY: You and/or your care partner have signed paperwork which will be entered into your electronic medical record.  These signatures attest to the fact that that the information above on your After Visit Summary has been reviewed and is understood.  Full responsibility of the confidentiality of this discharge information lies with you and/or your care-partner.

## 2022-09-01 NOTE — Progress Notes (Signed)
Pt. Held in PACU (care partner present) until Dr. Lorenso Courier able to come and speak to pt. Again re: sore throat/swallowing.

## 2022-09-01 NOTE — Op Note (Signed)
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 The patient's tolerance of                            previous anesthesia was also reviewed. The risks                            and benefits of the procedure and the sedation                            options and risks were discussed with the patient.                            All questions were answered, and informed consent                            was obtained. Prior Anticoagulants: The patient has                            taken no anticoagulant or antiplatelet agents. ASA                            Grade Assessment: II - A patient with mild systemic                            disease. After reviewing the risks and benefits,                            the patient was deemed in satisfactory condition to                            undergo the procedure.  After obtaining informed consent, the endoscope was                            passed under direct vision. Throughout the                            procedure, the patient's blood pressure, pulse, and                            oxygen saturations were monitored continuously. The                            Endoscope was introduced through the mouth, and                            advanced to the second part of  duodenum. The upper                            GI endoscopy was accomplished without difficulty.                            The patient tolerated the procedure well. Scope In: Scope Out: Findings:                 Two benign-appearing, intrinsic moderate                            (circumferential scarring or stenosis; an endoscope                            may pass) stenoses were found in the distal                            esophagus. The narrowest stenosis measured less                            than one cm (in length). The stenoses were                            traversed. A guidewire was placed and the scope was                            withdrawn. Dilation was performed with a Savary                            dilator with mild resistance at 18 mm. The dilation                            site was examined following endoscope reinsertion                            and showed mild improvement in luminal narrowing.  A hiatal hernia was present.                           Localized erythematous mucosa without bleeding was                            found in the gastric antrum.                           Biopsies were taken with a cold forceps in the                            cardia, in the gastric fundus, in the gastric body,                            in the transitional zone, at the incisura and in                            the gastric antrum for histology.                           The examined duodenum was normal. Complications:            No immediate complications. Estimated Blood Loss:     Estimated blood loss was minimal. Impression:               - Benign-appearing esophageal stenoses. Dilated.                           - Hiatal hernia.                           - Erythematous mucosa in the antrum.                           - Normal examined duodenum.                           - Biopsies were taken with a cold forceps for                             histology in the cardia, in the gastric fundus, in                            the gastric body, at the incisura and in the                            gastric antrum. Recommendation:           - Discharge patient to home (with escort).                           - Await pathology results.                           - Use Prilosec (omeprazole) 40 mg PO daily for 8  weeks.                           - Return to GI clinic in 6 weeks.                           - Repeat upper endoscopy in 1 year for surveillance.                           - The findings and recommendations were discussed                            with the patient. Dr Georgian Co "[name]" Lorenso Courier,  09/01/2022 11:18:03 AM

## 2022-09-01 NOTE — Progress Notes (Signed)
Called to room to assist during endoscopic procedure.  Patient ID and intended procedure confirmed with present staff. Received instructions for my participation in the procedure from the performing physician.  

## 2022-09-01 NOTE — Progress Notes (Signed)
GASTROENTEROLOGY PROCEDURE H&P NOTE   Primary Care Physician: Myrtie Neither, PA-C    Reason for Procedure:   Dysphagia, CTNNA1 gene mutation   Plan:    EGD  Patient is appropriate for endoscopic procedure(s) in the ambulatory ([place]) setting.  The nature of the procedure, as well as the risks, benefits, and alternatives were carefully and thoroughly reviewed with the patient. Ample time for discussion and questions allowed. The patient understood, was satisfied, and agreed to proceed.     HPI: Melody Rodriguez is a 46 y.o. female who presents for EGD for evaluation of dysphagia and CTNNA1 gene mutation .  Patient was most recently seen in the Gastroenterology Clinic on 08/07/22.  No interval change in medical history since that appointment. Please refer to that note for full details regarding GI history and clinical presentation.   Past Medical History:  Diagnosis Date   Anemia    Anxiety    Breast cancer ([place])    right breast cancer   Depression    Diabetes mellitus without complication (HCC)    glipizide and actps; cbgs 200s fasting   Family history of colon cancer    GERD (gastroesophageal reflux disease)    Headache    History of goiter    History of radiation therapy    right breast/scv  05/10/21-07/01/21  Dr Gery Pray   Hyperlipidemia    Monoallelic mutation of CTNNA3 gene    Recurrent major depressive disorder [facility])     Past Surgical History:  Procedure Laterality Date   BREAST BIOPSY Right 02/18/2021   Procedure: EVACUATION HEMATOMA RIGHT AXILLA;  Surgeon: [name], MD;  Location: [place];  Service: General;  Laterality: Right;   BREAST CYST EXCISION Right    Patient does not recall (2014 or 2015)   BREAST LUMPECTOMY WITH RADIOACTIVE SEED AND SENTINEL LYMPH NODE BIOPSY Right 02/07/2021   Procedure: RIGHT BREAST LUMPECTOMY WITH RADIOACTIVE SEED AND SEED TARGETED LYMPH NODE BIOPSY AND SENTINEL LYMPH NODE BIOPSY;  Surgeon: [name], MD;  Location: [place];  Service: General;  Laterality: Right;   CESAREAN SECTION     x2   IR IMAGING GUIDED PORT INSERTION  09/15/2020   IR REMOVAL TUN CV CATH W/O FL  09/15/2020   NODE DISSECTION Right 03/28/2021   Procedure: RIGHT AXILLARY LYMPH NODE DISSECTION;  Surgeon: [name], MD;  Location: [place];  Service: General;  Laterality: Right;   PORT-A-CATH REMOVAL Left 02/07/2021   Procedure: REMOVAL PORT-A-CATH;  Surgeon: [name], MD;  Location: [place];  Service: General;  Laterality: Left;   THYROIDECTOMY, PARTIAL      Prior to Admission medications   Medication Sig Start Date End Date Taking? Authorizing Provider  anastrozole (ARIMIDEX) 1 MG tablet Take 1 tablet (1 mg total) by mouth daily. 03/27/22  Yes Nicholas Lose, MD  atorvastatin (LIPITOR) 10 MG tablet Take one tablet (10 mg dose) by mouth daily. 02/17/22  Yes   escitalopram (LEXAPRO) 10 MG tablet Take 1 tablet (10 mg total) by mouth daily. 06/02/22  Yes   famotidine (PEPCID) 40 MG tablet Take 1 tablet (40 mg total) by mouth at bedtime. 08/07/22  Yes Vladimir Crofts, PA-C  fexofenadine (ALLEGRA) 180 MG tablet Take 180 mg by mouth daily as needed for allergies. 01/13/20  Yes [provider]  glipiZIDE (GLUCOTROL) 10 MG tablet Take 1 tablet (10 mg total) by mouth 2 (two) times daily. 08/07/22  Yes  Multiple Vitamins-Minerals (MULTIVITAMIN WOMEN) TABS Take 1 tablet by mouth daily.   Yes [provider]  pregabalin (LYRICA) 100 MG capsule Take 1 capsule (100 mg total) by mouth 2 (two) times daily. 07/31/22  Yes Jennye Boroughs, MD  Semaglutide, 2 MG/DOSE, (OZEMPIC, 2 MG/DOSE,) 8 MG/3ML SOPN Inject 2 mg into the skin once a week. 07/11/22  Yes   ACCU-CHEK GUIDE test strip USE TO MONITOR BLOOD GLUCOSE 3 TIME(S) DAILY 10/11/21   [provider]  Accu-Chek Softclix Lancets lancets 3 (three) times daily. 10/22/21   [provider]  acetaminophen (TYLENOL) 500 MG tablet Take 2,000 mg by mouth every 8 (eight) hours as needed for moderate pain.    [provider]  amitriptyline (ELAVIL) 10 MG tablet Take by mouth. 04/13/22   [provider]  Blood Glucose Monitoring Suppl (ACCU-CHEK GUIDE ME) w/Device KIT See admin instructions. 08/15/21   [provider]  fluticasone (FLONASE) 50 MCG/ACT nasal spray Place 1 spray into both nostrils daily as needed for allergies. 01/13/20   [provider]  hydrOXYzine (ATARAX) 10 MG tablet Take 1 tablet by mouth 3 (three) times a day as needed for anxiety. 05/15/22     loratadine (CLARITIN) 10 MG tablet Take by mouth. 09/01/20   [provider]  nystatin (MYCOSTATIN/NYSTOP) powder Apply topically 2 (two) times a day as needed. 05/16/22     triamcinolone cream (KENALOG) 0.1 % Apply topically 2 (two) times a day as needed. 08/22/21       Current Outpatient Medications  Medication Sig Dispense Refill   anastrozole (ARIMIDEX) 1 MG tablet Take 1 tablet (1 mg total) by mouth daily. 90 tablet 3   atorvastatin (LIPITOR) 10 MG tablet Take one tablet (10 mg dose) by mouth daily. 30 tablet 5   escitalopram (LEXAPRO) 10 MG tablet Take 1 tablet (10 mg total) by mouth daily. 30 tablet 2   famotidine (PEPCID) 40 MG tablet Take 1 tablet (40 mg total) by mouth at bedtime. 30 tablet 2   fexofenadine (ALLEGRA) 180 MG tablet Take 180 mg by mouth daily as needed for allergies.     glipiZIDE (GLUCOTROL) 10 MG tablet Take 1 tablet (10 mg total) by mouth 2 (two) times daily. 60 tablet 2   Multiple Vitamins-Minerals (MULTIVITAMIN WOMEN) TABS Take 1 tablet by mouth daily.     pregabalin (LYRICA) 100 MG capsule Take 1 capsule (100 mg total) by mouth 2 (two) times daily. 60 capsule 3   Semaglutide, 2 MG/DOSE, (OZEMPIC, 2 MG/DOSE,) 8 MG/3ML SOPN Inject 2 mg into the skin once a week. 3 mL 3   ACCU-CHEK GUIDE test strip USE TO MONITOR BLOOD GLUCOSE 3 TIME(S) DAILY      Accu-Chek Softclix Lancets lancets 3 (three) times daily.     acetaminophen (TYLENOL) 500 MG tablet Take 2,000 mg by mouth every 8 (eight) hours as needed for moderate pain.     amitriptyline (ELAVIL) 10 MG tablet Take by mouth.     Blood Glucose Monitoring Suppl (ACCU-CHEK GUIDE ME) w/Device KIT See admin instructions.     fluticasone (FLONASE) 50 MCG/ACT nasal spray Place 1 spray into both nostrils daily as needed for allergies.     hydrOXYzine (ATARAX) 10 MG tablet Take 1 tablet by mouth 3 (three) times a day as needed for anxiety. 90 tablet 0   loratadine (CLARITIN) 10 MG tablet Take by mouth.     nystatin (MYCOSTATIN/NYSTOP) powder Apply topically 2 (two) times a day as needed. 30 g  1   triamcinolone cream (KENALOG) 0.1 % Apply topically 2 (two) times a day as needed. 40 g 1   Current Facility-Administered Medications  Medication Dose Route Frequency Provider Last Rate Last Admin   0.9 %  sodium chloride infusion  500 mL Intravenous Continuous Sharyn Creamer, MD        Allergies as of 09/01/2022 - Review Complete 09/01/2022  Allergen Reaction Noted   Cephalexin Hives and Rash 11/03/2015   Duloxetine Rash 04/13/2022   Tramadol Other (See Comments) 01/10/2022   Latex Itching and Swelling 07/07/2020   Naproxen Itching 05/02/2021    Family History  Problem Relation Age of Onset   Hypertension Mother    Colon cancer Sister 15   Cancer Maternal Aunt        unknown type dx late 57s   Diabetes Maternal Grandmother    Breast cancer Neg Hx    Ovarian cancer Neg Hx    Endometrial cancer Neg Hx    Pancreatic cancer Neg Hx    Prostate cancer Neg Hx     Social History   Socioeconomic History   Marital status: Single    Spouse name: Not on file   Number of children: 2   Years of education: Not on file   Highest education level: Some college, no degree  Occupational History   Occupation: unemployed  Tobacco Use   Smoking status: Former    Packs/day: 1.00    Years: 22.00     Total pack years: 22.00    Types: Cigars, Cigarettes   Smokeless tobacco: Never   Tobacco comments:    quit 07/2020  Vaping Use   Vaping Use: Never used  Substance and Sexual Activity   Alcohol use: Yes    Comment: socially   Drug use: Yes    Types: Marijuana    Comment: daily   Sexual activity: Not Currently  Other Topics Concern   Not on file  Social History Narrative   Not on file   Social Determinants of Health   Financial Resource Strain: High Risk (08/29/2021)   Overall Financial Resource Strain (CARDIA)    Difficulty of Paying Living Expenses: Hard  Food Insecurity: Food Insecurity Present (08/29/2021)   Hunger Vital Sign    Worried About Running Out of Food in the Last Year: Sometimes true    Ran Out of Food in the Last Year: Sometimes true  Transportation Needs: No Transportation Needs (08/29/2021)   PRAPARE - Hydrologist (Medical): No    Lack of Transportation (Non-Medical): No  Physical Activity: Inactive (08/29/2021)   Exercise Vital Sign    Days of Exercise per Week: 0 days    Minutes of Exercise per Session: 0 min  Stress: Stress Concern Present (08/29/2021)   [place]    Feeling of Stress : To some extent  Social Connections: Socially Isolated (08/29/2021)   Social Connection and Isolation Panel [NHANES]    Frequency of Communication with Friends and Family: Three times a week    Frequency of Social Gatherings with Friends and Family: More than three times a week    Attends Religious Services: Never    Marine scientist or Organizations: No    Attends Archivist Meetings: Never    Marital Status: Never married  Intimate Partner Violence: Not At Risk (08/29/2021)   Humiliation, Afraid, Rape, and Kick questionnaire    Fear of Current or Ex-Partner:  No    Emotionally Abused: No    Physically Abused: No    Sexually Abused: No    Physical  Exam: Vital signs in last 24 hours: BP 126/79   Pulse 85   Temp (!) 97.5 F (36.4 C)   Ht 5' 4" (1.626 m)   Wt 210 lb (95.3 kg)   SpO2 98%   BMI 36.05 kg/m  GEN: NAD EYE: Sclerae anicteric ENT: MMM CV: Non-tachycardic Pulm: No increased WOB GI: Soft NEURO:  Alert & Oriented   Christia Reading, MD [facility]   09/01/2022 10:23 AM

## 2022-09-01 NOTE — Progress Notes (Signed)
Pt dressed, ready to go- c/o throat pain now as a "7", states she cannot swallow.  Pt awaiting Dr. Dr. Lorenso Courier to assess her prior to discharge.    C/O left arm pain while in the RR- states she woke up with it this am.  1216- report given to Lynn Ito RN

## 2022-09-04 ENCOUNTER — Ambulatory Visit: Payer: Medicaid Other | Admitting: Physical Medicine & Rehabilitation

## 2022-09-04 ENCOUNTER — Telehealth: Payer: Self-pay | Admitting: *Deleted

## 2022-09-04 NOTE — Telephone Encounter (Signed)
  Follow up Call-     09/01/2022   10:11 AM  Call back number  Post procedure Call Back phone  # [phone]  Permission to leave phone message Yes   [facility]

## 2022-09-05 ENCOUNTER — Encounter: Payer: Self-pay | Admitting: Gynecologic Oncology

## 2022-09-05 ENCOUNTER — Encounter: Payer: Self-pay | Admitting: Hematology and Oncology

## 2022-09-05 ENCOUNTER — Inpatient Hospital Stay: Payer: Medicaid Other | Attending: Hematology and Oncology | Admitting: Gynecologic Oncology

## 2022-09-05 VITALS — BP 123/64 | HR 91 | Temp 98.7°F | Resp 14 | Ht 65.35 in | Wt 209.0 lb

## 2022-09-05 DIAGNOSIS — Z79811 Long term (current) use of aromatase inhibitors: Secondary | ICD-10-CM | POA: Diagnosis not present

## 2022-09-05 DIAGNOSIS — Z170 Estrogen receptor positive status [ER+]: Secondary | ICD-10-CM | POA: Diagnosis not present

## 2022-09-05 DIAGNOSIS — C50411 Malignant neoplasm of upper-outer quadrant of right female breast: Secondary | ICD-10-CM | POA: Diagnosis present

## 2022-09-05 DIAGNOSIS — Z79818 Long term (current) use of other agents affecting estrogen receptors and estrogen levels: Secondary | ICD-10-CM | POA: Insufficient documentation

## 2022-09-05 DIAGNOSIS — E1165 Type 2 diabetes mellitus with hyperglycemia: Secondary | ICD-10-CM

## 2022-09-05 DIAGNOSIS — C773 Secondary and unspecified malignant neoplasm of axilla and upper limb lymph nodes: Secondary | ICD-10-CM | POA: Diagnosis present

## 2022-09-05 DIAGNOSIS — N911 Secondary amenorrhea: Secondary | ICD-10-CM

## 2022-09-05 NOTE — Patient Instructions (Signed)
We will tentatively plan for surgery at [facility] with Dr. Jeral Pinch on November 02, 2022. We will see you back in the office closer to the date for a preop appointment with [name] NP to discuss the instructions for before and after surgery.  You may also receive a phone call from the hospital to arrange for a pre-op appointment there as well. Usually both appointments can be combined on the same day.

## 2022-09-05 NOTE — Progress Notes (Signed)
Gynecologic Oncology Return Clinic Visit  09/05/22  Reason for Visit: Treatment planning in the setting of therapeutic BSO  Treatment History: Oncology History  Malignant neoplasm of upper-outer quadrant of right breast in female, estrogen receptor positive ([facility])  07/28/2020 Initial Diagnosis   Patient palpated a right breast mass for 1-2 years. Mammogram showed a 2.2cm mass at the 11 o'clock position with surrounding calcifications, 6.4cm in total extent, and up to 5 abnormal right axillary lymph nodes. 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    08/05/2020 Miscellaneous   MammaPrint: High risk luminal type B   08/11/2020 Genetic Testing   Negative genetic testing: no pathogenic variants detected in [facility] Breast Cancer STAT Panel or Common Hereditary Cancers panel. The report dates are August 11, 2020 and August 19, 2020, respectively. Two variants of uncertain signficance were detected - one in the CTNNA1 gene called c.86del and the second in the MLH1 gene called c.808A>G.   UPDATE:  The MLH1 c.808A>G VUS was reclassified to "Likely Benign" on 02/19/2021. The change in variant classification was made as a result of re-review of the evidence in light of Melody variant interpretation guidelines and/or Melody information.   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 The amended report date is February 16, 2022.   The STAT Breast cancer panel offered by [facility] includes sequencing and rearrangement analysis for the following 9 genes:  ATM, BRCA1, BRCA2, CDH1, CHEK2, PALB2, PTEN, STK11 and TP53.  The Common Hereditary Cancers Panel offered by [facility] includes sequencing and/or deletion duplication testing of the following 48 genes: APC, ATM, AXIN2, BARD1, BMPR1A, BRCA1, BRCA2, BRIP1, CDH1, CDK4, CDKN2A (p14ARF), CDKN2A (p16INK4a), CHEK2,  CTNNA1, DICER1, EPCAM (Deletion/duplication testing only), GREM1 (promoter region deletion/duplication testing only), KIT, MEN1, MLH1, MSH2, MSH3, MSH6, MUTYH, NBN, NF1, NTHL1, PALB2, PDGFRA, PMS2, POLD1, POLE, PTEN, RAD50, RAD51C, RAD51D, RNF43, SDHB, SDHC, SDHD, SMAD4, SMARCA4. STK11, TP53, TSC1, TSC2, and VHL.  The following genes were evaluated for sequence changes only: SDHA and HOXB13 c.251G>A variant only.    09/01/2020 - 01/11/2021 Neo-Adjuvant Chemotherapy   Adriamycin and Cytoxan x4 09/01/2020-10/12/2020 Weekly Taxol x 12  10/26/2020-01/11/2021(dose reduced d/t AE)   02/07/2021 Surgery   Right lumpectomy [facility]): invasive and in situ ductal carcinoma, 2.8cm, clear margins, with metastatic carcinoma in 1/1 right axillary lymph nodes.   03/28/2021 Surgery   Axillary lymph node dissection: 3/7 lymph nodes +   05/10/2021 - 07/01/2021 Radiation Therapy   Site Technique Total Dose (Gy) Dose per Fx (Gy) Completed Fx Beam Energies  Breast, Right: Breast_Rt 3D 50.4/50.4 1.8 28/28 10X  Breast, Right: Breast_Rt_Bst specialPort 12/12 2 6/6 15E  Sclav-RT: SCV_Rt 3D 50.4/50.4 1.8 28/28      07/2021 -  Anti-estrogen oral therapy   Zoladex + Letrozole + Verzenio     Interval History: Has overall been doing well.  Continues to have some intermittent cramping.  Denies any vaginal bleeding.  Reports baseline bowel function, struggles with bowel movements some.  Denies any urinary symptoms.  Has done well on Ozempic, lost about 17 pounds.  Her most recent hemoglobin A1c in October was 6.9%.  Random glucose in mid November was 98.  Past Medical/Surgical History: Past Medical History:  Diagnosis Date   Anemia    Anxiety    Breast cancer ([facility])    right breast cancer  Depression    Diabetes mellitus without complication ([facility])    glipizide and actps; cbgs 200s fasting   Family history of colon cancer    GERD (gastroesophageal reflux disease)    Headache    History of goiter    History of  radiation therapy    right breast/scv  05/10/21-07/01/21  Dr [facility]   Hyperlipidemia    Monoallelic mutation of CTNNA3 gene    Recurrent major depressive disorder [facility])     Past Surgical History:  Procedure Laterality Date   BREAST BIOPSY Right 02/18/2021   Procedure: EVACUATION HEMATOMA RIGHT AXILLA;  Surgeon: Melody Keens, MD;  Location: [facility];  Service: General;  Laterality: Right;   BREAST CYST EXCISION Right    Patient does not recall (2014 or 2015)   BREAST LUMPECTOMY WITH RADIOACTIVE SEED AND SENTINEL LYMPH NODE BIOPSY Right 02/07/2021   Procedure: RIGHT BREAST LUMPECTOMY WITH RADIOACTIVE SEED AND SEED TARGETED LYMPH NODE BIOPSY AND SENTINEL LYMPH NODE BIOPSY;  Surgeon: Melody Keens, MD;  Location: [facility];  Service: General;  Laterality: Right;   CESAREAN SECTION     x2   IR IMAGING GUIDED PORT INSERTION  09/15/2020   IR REMOVAL TUN CV CATH W/O FL  09/15/2020   NODE DISSECTION Right 03/28/2021   Procedure: RIGHT AXILLARY LYMPH NODE DISSECTION;  Surgeon: Melody Keens, MD;  Location: [facility];  Service: General;  Laterality: Right;   PORT-A-CATH REMOVAL Left 02/07/2021   Procedure: REMOVAL PORT-A-CATH;  Surgeon: Melody Keens, MD;  Location: [facility];  Service: General;  Laterality: Left;   THYROIDECTOMY, PARTIAL      Family History  Problem Relation Age of Onset   Hypertension Mother    Colon cancer Sister 30   Cancer Maternal Aunt        unknown type dx late 53s   Diabetes Maternal Grandmother    Breast cancer Neg Hx    Ovarian cancer Neg Hx    Endometrial cancer Neg Hx    Pancreatic cancer Neg Hx    Prostate cancer Neg Hx     Social History   Socioeconomic History   Marital status: Single    Spouse name: Not on file   Number of children: 2   Years of education: Not on file   Highest education level: Some college, no degree  Occupational History   Occupation:  unemployed  Tobacco Use   Smoking status: Former    Packs/day: 1.00    Years: 22.00    Total pack years: 22.00    Types: Cigars, Cigarettes   Smokeless tobacco: Never   Tobacco comments:    quit 07/2020  Vaping Use   Vaping Use: Never used  Substance and Sexual Activity   Alcohol use: Yes    Comment: socially   Drug use: Yes    Types: Marijuana    Comment: daily   Sexual activity: Not Currently  Other Topics Concern   Not on file  Social History Narrative   Not on file   Social Determinants of Health   Financial Resource Strain: High Risk (08/29/2021)   Overall Financial Resource Strain (CARDIA)    Difficulty of Paying Living Expenses: Hard  Food Insecurity: Food Insecurity Present (08/29/2021)   Hunger Vital Sign    Worried About Running Out of Food in the Last Year: Sometimes true    Ran Out of Food in the Last Year: Sometimes true  Transportation Needs: No Transportation Needs (08/29/2021)  PRAPARE - Hydrologist (Medical): No    Lack of Transportation (Non-Medical): No  Physical Activity: Inactive (08/29/2021)   Exercise Vital Sign    Days of Exercise per Week: 0 days    Minutes of Exercise per Session: 0 min  Stress: Stress Concern Present (08/29/2021)   [place]    Feeling of Stress : To some extent  Social Connections: Socially Isolated (08/29/2021)   Social Connection and Isolation Panel [NHANES]    Frequency of Communication with Friends and Family: Three times a week    Frequency of Social Gatherings with Friends and Family: More than three times a week    Attends Religious Services: Never    Marine scientist or Organizations: No    Attends Music therapist: Never    Marital Status: Never married    Current Medications:  Current Outpatient Medications:    ACCU-CHEK GUIDE test strip, USE TO MONITOR BLOOD GLUCOSE 3 TIME(S) DAILY, Disp: ,  Rfl:    Accu-Chek Softclix Lancets lancets, 3 (three) times daily., Disp: , Rfl:    acetaminophen (TYLENOL) 500 MG tablet, Take 2,000 mg by mouth every 8 (eight) hours as needed for moderate pain., Disp: , Rfl:    amitriptyline (ELAVIL) 10 MG tablet, Take by mouth., Disp: , Rfl:    anastrozole (ARIMIDEX) 1 MG tablet, Take 1 tablet (1 mg total) by mouth daily., Disp: 90 tablet, Rfl: 3   atorvastatin (LIPITOR) 10 MG tablet, Take one tablet (10 mg dose) by mouth daily., Disp: 30 tablet, Rfl: 5   Blood Glucose Monitoring Suppl (ACCU-CHEK GUIDE ME) w/Device KIT, See admin instructions., Disp: , Rfl:    escitalopram (LEXAPRO) 10 MG tablet, Take 1 tablet (10 mg total) by mouth daily., Disp: 30 tablet, Rfl: 2   famotidine (PEPCID) 40 MG tablet, Take 1 tablet (40 mg total) by mouth at bedtime., Disp: 30 tablet, Rfl: 2   fexofenadine (ALLEGRA) 180 MG tablet, Take 180 mg by mouth daily as needed for allergies., Disp: , Rfl:    fluticasone (FLONASE) 50 MCG/ACT nasal spray, Place 1 spray into both nostrils daily as needed for allergies., Disp: , Rfl:    glipiZIDE (GLUCOTROL) 10 MG tablet, Take 1 tablet (10 mg total) by mouth 2 (two) times daily., Disp: 60 tablet, Rfl: 2   hydrOXYzine (ATARAX) 10 MG tablet, Take 1 tablet by mouth 3 (three) times a day as needed for anxiety., Disp: 90 tablet, Rfl: 0   loratadine (CLARITIN) 10 MG tablet, Take by mouth., Disp: , Rfl:    Multiple Vitamins-Minerals (MULTIVITAMIN WOMEN) TABS, Take 1 tablet by mouth daily., Disp: , Rfl:    nystatin (MYCOSTATIN/NYSTOP) powder, Apply topically 2 (two) times a day as needed., Disp: 30 g, Rfl: 1   pregabalin (LYRICA) 100 MG capsule, Take 1 capsule (100 mg total) by mouth 2 (two) times daily., Disp: 60 capsule, Rfl: 3   Semaglutide, 2 MG/DOSE, (OZEMPIC, 2 MG/DOSE,) 8 MG/3ML SOPN, Inject 2 mg into the skin once a week., Disp: 3 mL, Rfl: 3   triamcinolone cream (KENALOG) 0.1 %, Apply topically 2 (two) times a day as needed., Disp: 40 g, Rfl:  1   lidocaine (XYLOCAINE) 2 % solution, Use 15 mLs in the mouth or throat every 3 (three) hours as needed (Throat pain)., Disp: 100 mL, Rfl: 0   omeprazole (PRILOSEC) 40 MG capsule, Take 1 capsule (40 mg total) by mouth daily for 8  weeks, Disp: 30 capsule, Rfl: 1  Review of Systems: + Change in appetite, fever/chills, fatigue, mouth sores, ringing in the ears, shortness of breath, hearing loss, wheezing, palpitations, bloating, constipation, diarrhea, hot flashes, joint pain, back pain, muscle pain/cramps, itching, dizziness, migraines, anxiety, depression, decreased concentration. Denies unexplained weight changes. Denies neck lumps or masses or voice changes. Denies cough. Denies chest pain. Denies leg swelling. Denies abdominal pain, blood in stools, nausea, vomiting, or early satiety. Denies pain with intercourse, dysuria, frequency, hematuria or incontinence. Denies hot flashes, pelvic pain, vaginal bleeding or vaginal discharge.   Denies itching, rash, or wounds. Denies numbness or seizures. Denies swollen lymph nodes or glands, denies easy bruising or bleeding.  Physical Exam: BP 123/64 (BP Location: Left Arm, Patient Position: Sitting)   Pulse 91   Temp 98.7 F (37.1 C) (Oral)   Resp 14   Ht 5' 5.35" (1.66 m)   Wt 209 lb (94.8 kg)   SpO2 99%   BMI 34.40 kg/m  General: Alert, oriented, no acute distress. HEENT: Normocephalic, atraumatic, sclera anicteric. Chest: Clear to auscultation bilaterally.  No wheezes or rhonchi. Cardiovascular: Regular rate and rhythm, no murmurs. Abdomen: Obese, soft, nontender.  Normoactive bowel sounds.  No masses or hepatosplenomegaly appreciated.   Extremities: Grossly normal range of motion.  Warm, well perfused.  No edema bilaterally. Skin: No rashes or lesions noted. GU: Normal appearing external genitalia without erythema, excoriation, or lesions.  Speculum exam reveals speculum normal in appearance, somewhat atrophic appearing.  Bimanual  exam reveals small mobile uterus, no adnexal masses appreciated.    Laboratory & Radiologic Studies: Had upper endoscopy on 12/15 - pathology pending.  Assessment & Plan: Melody [facility] is a 47 y.o. woman with ER+ breast cancer interested in therapeutic BSO.   Patient has been able to improve her glycemic control considerably.  Notes no blood sugars in the 200s since her visit with me.  Discussed moving forward with getting therapeutic BSO scheduled.  We will have my office reach out to her tomorrow to pick a date for surgery in August.  We will plan to have her come in for a hemoglobin A1c at the time of her preoperative visit within a couple of weeks of surgery.  Discussed ovarian suppression in the setting of her estrogen receptor positive breast cancer.  We reviewed that this can be with medication suppression, as she is currently receiving, or excision of the ovaries.  She would like to move forward with previously discussed therapeutic BSO.  Surgery will be scheduled for early February.  She will return closer to the date of surgery for perioperative education.  We discussed general postoperative expectations.  On further review of Pap smear history, it sounds like she had an abnormal Pap smear sometime ago.  She does not remember having colposcopy or any procedures done on her cervix.  Thinks that her repeat Pap smear after this abnormal lung came back normal.  By my office's review, she had a Pap smear in 2016 and again in 2022, both of which were normal.  I congratulated her on improvement in her diabetic control as well as her weight loss.  22 minutes of total time was spent for this patient encounter, including preparation, face-to-face counseling with the patient and coordination of care, and documentation of the encounter.  Jeral Pinch, MD  Division of Gynecologic Oncology  Department of Obstetrics and Gynecology  [facility]

## 2022-09-06 ENCOUNTER — Other Ambulatory Visit: Payer: Self-pay | Admitting: Gynecologic Oncology

## 2022-09-06 DIAGNOSIS — Z170 Estrogen receptor positive status [ER+]: Secondary | ICD-10-CM

## 2022-09-07 ENCOUNTER — Inpatient Hospital Stay: Payer: Medicaid Other

## 2022-09-07 ENCOUNTER — Ambulatory Visit: Payer: Medicaid Other

## 2022-09-07 ENCOUNTER — Other Ambulatory Visit: Payer: Self-pay

## 2022-09-07 DIAGNOSIS — C161 Malignant neoplasm of fundus of stomach: Secondary | ICD-10-CM

## 2022-09-07 NOTE — Progress Notes (Signed)
Hi Beth, also please schedule this patient for an urgent CT C/A/P w/contrast for staging.   Santiago Glad, this is the signet cell gastric carcinoma patient I mentioned to you earlier today

## 2022-09-07 NOTE — Progress Notes (Signed)
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 The treatment for this is traditionally total gastrectomy. Will message her oncologist Dr. Lindi Adie so that he can follow up with her to discuss this finding and will also place an urgent referral to surgery.   Beth, please place urgent referral to Dr. Zenia Resides or Dr. Barry Dienes for consideration of total gastrectomy.  Dr. Lindi Adie, please see the message above.

## 2022-09-08 ENCOUNTER — Ambulatory Visit (HOSPITAL_BASED_OUTPATIENT_CLINIC_OR_DEPARTMENT_OTHER)
Admission: RE | Admit: 2022-09-08 | Discharge: 2022-09-08 | Disposition: A | Discharge status: Home / Self Care | Payer: Medicaid Other | Source: Ambulatory Visit | Attending: Internal Medicine | Admitting: Internal Medicine

## 2022-09-08 DIAGNOSIS — C161 Malignant neoplasm of fundus of stomach: Secondary | ICD-10-CM | POA: Diagnosis present

## 2022-09-08 LAB — POCT I-STAT CREATININE: Creatinine, Ser: 0.7 mg/dL (ref 0.44–1.00)

## 2022-09-08 MED ORDER — IOPAMIDOL (ISOVUE-370) INJECTION 76%
100.0000 mL | Freq: Once | INTRAVENOUS | Status: AC | PRN
  Administered 2022-09-08: 100 mL via INTRAVENOUS

## 2022-09-12 ENCOUNTER — Other Ambulatory Visit (HOSPITAL_COMMUNITY): Payer: Self-pay

## 2022-09-12 NOTE — Progress Notes (Signed)
Hi Beth, I don't see that she has an appt with surgery (Dr. Zenia Resides or Barry Dienes) yet for signet cell carcinoma of the stomach. Could we contact them and see what is going on?

## 2022-09-13 ENCOUNTER — Other Ambulatory Visit (HOSPITAL_COMMUNITY): Payer: Self-pay

## 2022-09-13 ENCOUNTER — Other Ambulatory Visit: Payer: Self-pay

## 2022-09-13 MED ORDER — ATORVASTATIN CALCIUM 10 MG PO TABS
10.0000 mg | ORAL_TABLET | Freq: Every day | ORAL | 5 refills | Status: DC
  Filled 2022-09-13: qty 30, 30d supply, fill #0
  Filled 2022-10-15: qty 30, 30d supply, fill #1
  Filled 2022-11-18: qty 30, 30d supply, fill #2
  Filled 2022-12-29: qty 30, 30d supply, fill #3

## 2022-09-13 MED ORDER — ESCITALOPRAM OXALATE 10 MG PO TABS
10.0000 mg | ORAL_TABLET | Freq: Every day | ORAL | 2 refills | Status: DC
  Filled 2022-09-13: qty 30, 30d supply, fill #0
  Filled 2022-10-15: qty 30, 30d supply, fill #1
  Filled 2022-11-18: qty 30, 30d supply, fill #2

## 2022-09-14 ENCOUNTER — Inpatient Hospital Stay: Payer: Medicaid Other

## 2022-09-14 ENCOUNTER — Other Ambulatory Visit: Payer: Self-pay

## 2022-09-14 VITALS — BP 124/76 | HR 79 | Temp 98.9°F | Resp 18

## 2022-09-14 DIAGNOSIS — Z170 Estrogen receptor positive status [ER+]: Secondary | ICD-10-CM

## 2022-09-14 DIAGNOSIS — C50411 Malignant neoplasm of upper-outer quadrant of right female breast: Secondary | ICD-10-CM | POA: Diagnosis not present

## 2022-09-14 MED ORDER — GOSERELIN ACETATE 3.6 MG ~~LOC~~ IMPL
3.6000 mg | DRUG_IMPLANT | Freq: Once | SUBCUTANEOUS | Status: AC
  Administered 2022-09-14: 3.6 mg via SUBCUTANEOUS
  Filled 2022-09-14: qty 3.6

## 2022-09-18 DIAGNOSIS — Z148 Genetic carrier of other disease: Secondary | ICD-10-CM

## 2022-09-18 HISTORY — DX: Genetic carrier of other disease: Z14.8

## 2022-09-27 ENCOUNTER — Encounter: Payer: Self-pay | Admitting: Gynecologic Oncology

## 2022-09-27 ENCOUNTER — Other Ambulatory Visit: Payer: Self-pay

## 2022-09-27 NOTE — Progress Notes (Signed)
The proposed treatment discussed in conference is for discussion purpose only and is not a binding recommendation.  The patients have not been physically examined, or presented with their treatment options.  Therefore, final treatment plans cannot be decided.  

## 2022-10-03 ENCOUNTER — Encounter: Payer: Self-pay | Admitting: Physical Medicine & Rehabilitation

## 2022-10-03 ENCOUNTER — Other Ambulatory Visit (HOSPITAL_COMMUNITY): Payer: Self-pay

## 2022-10-03 ENCOUNTER — Encounter: Payer: Medicaid Other | Attending: Physical Medicine & Rehabilitation | Admitting: Physical Medicine & Rehabilitation

## 2022-10-03 VITALS — BP 102/72 | HR 83 | Ht 63.0 in | Wt 203.8 lb

## 2022-10-03 DIAGNOSIS — M79601 Pain in right arm: Secondary | ICD-10-CM | POA: Diagnosis present

## 2022-10-03 DIAGNOSIS — F0630 Mood disorder due to known physiological condition, unspecified: Secondary | ICD-10-CM

## 2022-10-03 DIAGNOSIS — G8929 Other chronic pain: Secondary | ICD-10-CM | POA: Diagnosis not present

## 2022-10-03 DIAGNOSIS — G90511 Complex regional pain syndrome I of right upper limb: Secondary | ICD-10-CM | POA: Diagnosis present

## 2022-10-03 MED ORDER — PREGABALIN 150 MG PO CAPS
150.0000 mg | ORAL_CAPSULE | Freq: Two times a day (BID) | ORAL | 4 refills | Status: DC
  Filled 2022-10-03 – 2022-11-18 (×2): qty 60, 30d supply, fill #0
  Filled 2022-12-29: qty 60, 30d supply, fill #1

## 2022-10-03 NOTE — Progress Notes (Signed)
Subjective:    Patient ID: Melody Rodriguez, female    DOB: 1976/07/11, 47 y.o.   MRN: [id number]  HPI HPI 26/73 47 year old female with past medical history of diabetes mellitus, breast cancer status post lumpectomy with radioactive seeds 02/07/2021, breast biopsy 02/18/2021 and node dissection 03/28/2021.  Patient reports she began having pain in her right upper extremity after her surgeries for treatment of breast cancer.  She developed pain in her shoulder and the pain spread to her entire arm.  Her arm is very sensitive to touch and has increased swelling.  She reports it has appeared red and swollen at times.  She has difficulty flexing or abducting her shoulder.  She keeps the arm by her side with elbow flexed and wrist flexed.  She had worked with PT although she had to stop it due to pain.  She reports the function in her right arm improved after the physical therapy.  She also has pain when she looks to the right with her head.  She cannot sleep on her right side and uses pillows to avoid sensation to her right arm.  Mobic does not provide significant benefit, 2x shoulder injections did not help the pain.  She reports her mood is decreased due to the pain.   Visit 06/19/22 [name] is here for follow-up of her right upper extremity pain.  She reports pain is roughly similar to her last visit.  She is able to reach her arm a little bit higher to get things on shelves at home.  She still cannot lift above her shoulder level on her right.  She continues to have burning, tingling and stabbing pains in her right upper back, shoulder, upper arm, elbow and sometimes hand and wrist.  She is trying to use her arm for more activities at home however says this is difficult.  She is not interested in physical therapy at this time.  Patient reports she is taking gabapentin 100 mg 3 times a day.  She says this is causing her some mild sedation.     Interval History  [name] is here for follow-up of her  chronic pain in her right upper extremity.  She continues to have pain in her arm, shoulder and around lats on her right side.  She reports Lyrica did improve the pain a little bit.  She is not having any significant side effects with the medication.  She continues to have difficulty using her upper extremity especially for activities where her shoulder is abducted.  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  She has a visit with oncology to discuss treatment options for this cancer.    Pain Inventory Average Pain 7 Pain Right Now 5 My pain is constant, sharp, burning, dull, stabbing, and aching  In the last 24 hours, has pain interfered with the following? General activity 8 Relation with others 8 Enjoyment of life 8 What TIME of day is your pain at its worst? morning , daytime, evening, and night Sleep (in general) Fair  Pain is worse with: walking, bending, sitting, inactivity, standing, and some activites Pain improves with: medication Relief from Meds: 2  Family History  Problem Relation Age of Onset   Hypertension Mother    Colon cancer Sister 25   Cancer Maternal Aunt        unknown type dx late 108s   Diabetes Maternal Grandmother    Breast cancer Neg Hx  Ovarian cancer Neg Hx    Endometrial cancer Neg Hx    Pancreatic cancer Neg Hx    Prostate cancer Neg Hx    Social History   Socioeconomic History   Marital status: Single    Spouse name: Not on file   Number of children: 2   Years of education: Not on file   Highest education level: Some college, no degree  Occupational History   Occupation: unemployed  Tobacco Use   Smoking status: Former    Packs/day: 1.00    Years: 22.00    Total pack years: 22.00    Types: Cigars, Cigarettes   Smokeless tobacco: Never   Tobacco comments:    quit 07/2020  Vaping Use   Vaping Use: Never used  Substance and Sexual Activity   Alcohol use: Yes    Comment: socially   Drug use: Yes     Types: Marijuana    Comment: daily   Sexual activity: Not Currently  Other Topics Concern   Not on file  Social History Narrative   Not on file   Social Determinants of Health   Financial Resource Strain: High Risk (08/29/2021)   Overall Financial Resource Strain (CARDIA)    Difficulty of Paying Living Expenses: Hard  Food Insecurity: Food Insecurity Present (08/29/2021)   Hunger Vital Sign    Worried About [place] in the Last Year: Sometimes true    Ran Out of Food in the Last Year: Sometimes true  Transportation Needs: No Transportation Needs (08/29/2021)   PRAPARE - Hydrologist (Medical): No    Lack of Transportation (Non-Medical): No  Physical Activity: Inactive (08/29/2021)   Exercise Vital Sign    Days of Exercise per Week: 0 days    Minutes of Exercise per Session: 0 min  Stress: Stress Concern Present (08/29/2021)   [place]    Feeling of Stress : To some extent  Social Connections: Socially Isolated (08/29/2021)   Social Connection and Isolation Panel [NHANES]    Frequency of Communication with Friends and Family: Three times a week    Frequency of Social Gatherings with Friends and Family: More than three times a week    Attends Religious Services: Never    Marine scientist or Organizations: No    Attends Archivist Meetings: Never    Marital Status: Never married   Past Surgical History:  Procedure Laterality Date   BREAST BIOPSY Right 02/18/2021   Procedure: EVACUATION HEMATOMA RIGHT AXILLA;  Surgeon: [name], MD;  Location: [place];  Service: General;  Laterality: Right;   BREAST CYST EXCISION Right    Patient does not recall (2014 or 2015)   BREAST LUMPECTOMY WITH RADIOACTIVE SEED AND SENTINEL LYMPH NODE BIOPSY Right 02/07/2021   Procedure: RIGHT BREAST LUMPECTOMY WITH RADIOACTIVE SEED AND SEED TARGETED LYMPH  NODE BIOPSY AND SENTINEL LYMPH NODE BIOPSY;  Surgeon: [name], MD;  Location: [place];  Service: General;  Laterality: Right;   CESAREAN SECTION     x2   IR IMAGING GUIDED PORT INSERTION  09/15/2020   IR REMOVAL TUN CV CATH W/O FL  09/15/2020   NODE DISSECTION Right 03/28/2021   Procedure: RIGHT AXILLARY LYMPH NODE DISSECTION;  Surgeon: [name], MD;  Location: [place];  Service: General;  Laterality: Right;   PORT-A-CATH REMOVAL Left 02/07/2021   Procedure: REMOVAL PORT-A-CATH;  Surgeon: [name],  Nathaneil Canary, MD;  Location: [place];  Service: General;  Laterality: Left;   THYROIDECTOMY, PARTIAL     Past Surgical History:  Procedure Laterality Date   BREAST BIOPSY Right 02/18/2021   Procedure: EVACUATION HEMATOMA RIGHT AXILLA;  Surgeon: [name], MD;  Location: [place];  Service: General;  Laterality: Right;   BREAST CYST EXCISION Right    Patient does not recall (2014 or 2015)   BREAST LUMPECTOMY WITH RADIOACTIVE SEED AND SENTINEL LYMPH NODE BIOPSY Right 02/07/2021   Procedure: RIGHT BREAST LUMPECTOMY WITH RADIOACTIVE SEED AND SEED TARGETED LYMPH NODE BIOPSY AND SENTINEL LYMPH NODE BIOPSY;  Surgeon: [name], MD;  Location: [place];  Service: General;  Laterality: Right;   CESAREAN SECTION     x2   IR IMAGING GUIDED PORT INSERTION  09/15/2020   IR REMOVAL TUN CV CATH W/O FL  09/15/2020   NODE DISSECTION Right 03/28/2021   Procedure: RIGHT AXILLARY LYMPH NODE DISSECTION;  Surgeon: [name], MD;  Location: [place];  Service: General;  Laterality: Right;   PORT-A-CATH REMOVAL Left 02/07/2021   Procedure: REMOVAL PORT-A-CATH;  Surgeon: [name], MD;  Location: [place];  Service: General;  Laterality: Left;   THYROIDECTOMY, PARTIAL     Past Medical History:  Diagnosis Date   Anemia    Anxiety    Breast cancer  ([place])    right breast cancer   Depression    Diabetes mellitus without complication ([facility])    glipizide and actps; cbgs 200s fasting   Family history of colon cancer    GERD (gastroesophageal reflux disease)    Headache    History of goiter    History of radiation therapy    right breast/scv  05/10/21-07/01/21  Dr Gery Pray   Hyperlipidemia    Monoallelic mutation of CTNNA3 gene    Recurrent major depressive disorder (HCC)    BP 102/72   Pulse 83   Ht '5\' 3"'$  (1.6 m)   Wt 203 lb 12.8 oz (92.4 kg)   SpO2 96%   BMI 36.10 kg/m   Opioid Risk Score:   Fall Risk Score:  `1  Depression screen [facility] 2/9     10/03/2022    9:35 AM 07/31/2022   10:36 AM 06/19/2022   10:46 AM 05/23/2022    9:38 AM 04/13/2022   10:11 AM 03/14/2022    9:50 AM 08/29/2021    9:00 AM  Depression screen PHQ 2/9  Decreased Interest '3 3 3 '$ 0 1 3   Down, Depressed, Hopeless '3 3 3 '$ 0 '1 3 1  '$ PHQ - 2 Score '6 6 6 '$ 0 '2 6 1  '$ Altered sleeping      3 1  Tired, decreased energy      3 2  Change in appetite      3 0  Feeling bad or failure about yourself       3   Trouble concentrating      3 3  Moving slowly or fidgety/restless      3 0  Suicidal thoughts      1 0  PHQ-9 Score      25 7  Difficult doing work/chores      Very difficult      Review of Systems  Musculoskeletal:  Positive for back pain.       Right arm pain  All other systems reviewed and are negative.     Objective:  Physical Exam   Gen: no distress, normal appearing HEENT: oral mucosa pink and moist, NCAT Chest: normal effort, normal rate of breathing Abd: soft, non-distended Ext: no edema Psych: pleasant, normal affect   Skin: intact Neuro: Alert and oriented, sensation intact in all 4 extremities, DTR normal and symmetric Musculoskeletal:  Strength 5/5 in LUE and B/L LE Strength at least 4/5 RUE limited by pain Limited active range of motion to about 80 degrees abduction of the right shoulder Pain with internal and external rotation  of the shoulder and limited range of motion Pain to palpation throughout RUE particularly the shoulder, upper arm, elbow, lats Mild swelling in right upper extremity compared to left upper extremity Increased pain with compression of the metacarpals right upper extremity today No paraspinal tenderness     MRI shoulder R IMPRESSION: 1. Mild distal infraspinatus tendinosis. No evidence of rotator cuff tear. No evidence of labral tear. 2. Faint intramuscular edema within teres minor, consistent with low-grade muscle strain. 3. Mild soft tissue edema within the right axilla compatible with history of prior axillary lymph node dissection.        Assessment & Plan:   Right upper extremity pain. Suspicious for CPRS versus neuropathic pain related to cancer treatment, diagnosis complicated by edema due to lymph node dissection. Suspect she also has component of frozen shoulder although this by itself wouldn't explain the entirely of her diffuse pain throughout her RUE.  Prior MRI shows infraspinatus tendinitis -Duloxetine 30 mg stopped previously due to rash -Gabapentin caused sedation in the past she has stopped using amitriptyline as well -Continue meloxicam -Continue home exercise program, She declines restart formal PT, saying limitations in time and cost.  She also does not think this will help. -Triple phase bone scan was negative -Increase Lyrica to 150 mg twice daily -Discussed option to try medication like venlafaxine, patient is not interested at this time -Discussed option for sympathetic nerve block patient declines -Discussed option for TENS unit, she is not interested at this time   Mood disorder, Sleep disorder -Denies SI or HI -She has stopped using Elavil and is now on Lexapro ordered by her PCP -She had a rash with duloxetine but venlafaxine could be an option.  She is not interested in this medication currently

## 2022-10-05 ENCOUNTER — Ambulatory Visit: Payer: Medicaid Other

## 2022-10-10 ENCOUNTER — Encounter: Payer: Self-pay | Admitting: Gynecologic Oncology

## 2022-10-10 ENCOUNTER — Telehealth: Payer: Self-pay | Admitting: Gynecologic Oncology

## 2022-10-10 NOTE — Telephone Encounter (Signed)
Called patient to follow up on joint procedure. Based on message from Dr. Zenia Resides received today, plan is to cancel her surgery (total gastrectomy) and have a second opinion at [facility]. Advised patient I would send her a mychart message as well.

## 2022-10-12 ENCOUNTER — Inpatient Hospital Stay: Payer: Medicaid Other | Admitting: Gynecologic Oncology

## 2022-10-12 ENCOUNTER — Inpatient Hospital Stay: Payer: Medicaid Other | Attending: Hematology and Oncology

## 2022-10-12 VITALS — BP 132/76 | HR 85 | Temp 98.6°F | Resp 16

## 2022-10-12 DIAGNOSIS — C50411 Malignant neoplasm of upper-outer quadrant of right female breast: Secondary | ICD-10-CM | POA: Diagnosis present

## 2022-10-12 DIAGNOSIS — C773 Secondary and unspecified malignant neoplasm of axilla and upper limb lymph nodes: Secondary | ICD-10-CM | POA: Diagnosis present

## 2022-10-12 DIAGNOSIS — Z170 Estrogen receptor positive status [ER+]: Secondary | ICD-10-CM | POA: Diagnosis not present

## 2022-10-12 DIAGNOSIS — Z5111 Encounter for antineoplastic chemotherapy: Secondary | ICD-10-CM | POA: Diagnosis present

## 2022-10-12 MED ORDER — GOSERELIN ACETATE 3.6 MG ~~LOC~~ IMPL
3.6000 mg | DRUG_IMPLANT | Freq: Once | SUBCUTANEOUS | Status: AC
  Administered 2022-10-12: 3.6 mg via SUBCUTANEOUS
  Filled 2022-10-12: qty 3.6

## 2022-10-13 ENCOUNTER — Other Ambulatory Visit (HOSPITAL_COMMUNITY): Payer: Self-pay

## 2022-10-16 ENCOUNTER — Other Ambulatory Visit (HOSPITAL_COMMUNITY): Payer: Self-pay

## 2022-10-19 ENCOUNTER — Ambulatory Visit (INDEPENDENT_AMBULATORY_CARE_PROVIDER_SITE_OTHER): Payer: Medicaid Other | Admitting: Internal Medicine

## 2022-10-19 ENCOUNTER — Encounter: Payer: Self-pay | Admitting: Internal Medicine

## 2022-10-19 ENCOUNTER — Encounter: Payer: Self-pay | Admitting: Genetic Counselor

## 2022-10-19 VITALS — BP 118/78 | HR 87 | Ht 63.0 in | Wt 205.4 lb

## 2022-10-19 DIAGNOSIS — C161 Malignant neoplasm of fundus of stomach: Secondary | ICD-10-CM | POA: Diagnosis not present

## 2022-10-19 NOTE — Progress Notes (Signed)
10/19/2022 DURENDA PECHACEK [id number] December 01, 1975  Referring provider: Myrtie Neither, PA-C Primary GI doctor: Dr. Lorenso Courier  ASSESSMENT AND PLAN:   GERD. Currently well controlled on Pepcid - Continue Pepcid  Signet cell carcinoma CTNNA1 gene mutation Patient was recently diagnosed with signet cell carcinoma based on gastric biopsies taken in 08/2022. Patient is hesitant to pursue total gastrectomy at this time so she is getting a second opinion. - Awaiting input from Dr. Mariah Milling with [facility] to decide on next steps. - Patient will let us know if we can help with her care in the future  Family history of colon cancer Sister with colon cancer. Last colonoscopy with Dr. Patsey Berthold 08/04/2022 for screening purposes that showed mild diverticulosis, non bleeding internal hemorrhoids. Adequate prep.  - Next colonoscopy due in 07/2027 - RTC PRN  HISTORY OF PRESENT ILLNESS: 47 y.o. 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  Interval History: She saw Dr. Mariah Milling with the [facility] to get a second opinion on the treatment of her signet cell carcinoma. Dr. Mariah Milling told her that she will bring her case to a multidisciplinary conference for review and get back to the patient. She did meet with Dr. Zenia Resides to discuss the total gastrectomy as well. She is eating once per day. She has felt full really easily, which she has attributed to Ozempic. Weight has been stable. If she doesn't drink enough water, she will have more constipation. She has been doing well on Pepcid for GERD. She did find out that she had a cousin that died of stomach cancer.  Wt Readings from Last 3 Encounters:  10/19/22 205 lb 6 oz (93.2 kg)  10/03/22 203 lb 12.8 oz (92.4 kg)  09/05/22 209 lb (94.8 kg)   Labs 09/2022: CBC and CMP  unremarkable. CEA nml.   Colonoscopy with Dr. Patsey Berthold 08/04/2022 for screening purposes, Mild diverticulosis, non bleeding internal hemorrhoids. Adequate prep. Recall 5 years.   EGD 09/01/22:  Path: 1. Surgical [P], gastric antrum bx - ANTRAL/OXYNTIC MUCOSA WITH CHEMICAL/REACTIVE CHANGE. - NO HELICOBACTER PYLORI ORGANISMS IDENTIFIED ON H&E STAINED SLIDE. - NEGATIVE FOR DYSPLASIA. 2. Surgical [P], stomach, transitional zone bx - OXYNTIC MUCOSA WITH NO SIGNIFICANT PATHOLOGY. - NO HELICOBACTER PYLORI ORGANISMS IDENTIFIED ON H&E STAINED SLIDE. - NEGATIVE FOR DYSPLASIA. 3. Surgical [P], incisura bx - OXYNTIC MUCOSA WITH NO SIGNIFICANT PATHOLOGY. - NO HELICOBACTER PYLORI ORGANISMS IDENTIFIED ON H&E STAINED SLIDE. - NEGATIVE FOR DYSPLASIA. 4. Surgical [P], gastric body bx - OXYNTIC MUCOSA WITH NO SIGNIFICANT PATHOLOGY. - NO HELICOBACTER PYLORI ORGANISMS IDENTIFIED ON H&E STAINED SLIDE. - NEGATIVE FOR DYSPLASIA. 5. Surgical [P], gastric fundus bx - SMALL FOCUS OF SIGNET RING CELL CARCINOMA IN THE SUPERFICIAL LAMINA PROPRIA MEASURING <1 MM AND PRESENT IN 1 OF 5 FRAGMENTS. NOTE: THE CLINICAL HISTORY OF A BREAST CARCINOMA WITH A GENETIC FINDING OF A CTNNA1 MUTATION IS NOTED. WHILE RARE, HEREDITARY DIFFUSE GASTRIC CARCINOMA HAS BEEN IDENTIFIED IN PATIENTS/FAMILIES WITH A CTNNA1 MUTATION WITHOUT CDH1 MUTATIONS. IN THE SUPERFICIAL LAMINA PROPRIA THERE IS VERY FOCAL SIGNET RING CELL CARCINOMA (CK7 AND CK AE1/AE3 POSITIVE). ADDITIONAL IMMUNOHISTOCHEMICAL STAINS REVIEWED INCLUDE GATA3 AND ER TO EXCLUDE BREAST ORIGIN, AS WELL AS CDX2 AND CD68 WHICH ARE NEGATIVE AND E-CADHERIN WHICH HAS RETAINED MEMBRANOUS STAINING. IT SHOULD BE NOTED THAT THIS FOCUS IS LESS THAN 1 MM,  AND IF RESECTION IS UNDERTAKEN RESIDUAL TUMOR FROM THIS LOCATION WILL NOT BE PRESENT AS THIS IS "AN INCIDENTAL FINDING". DR. Vic Ripper HAS PEER REVIEWED THE CASE AND AGREES WITH THE INTERPRETATION. DR. Lorenso Courier AT [facility] ON 09/06/2022. 6. Surgical [P], cardia bx - OXYNTIC MUCOSA WITH NO SIGNIFICANT PATHOLOGY. - NO HELICOBACTER PYLORI ORGANISMS IDENTIFIED ON H&E STAINED SLIDE. - NEGATIVE FOR DYSPLASIA. 7. Surgical [P], esophagus bx - SQUAMOUS MUCOSA WITH NO SIGNIFICANT PATHOLOGY.  Current Medications:   Current Outpatient Medications (Endocrine & Metabolic):    glipiZIDE (GLUCOTROL) 10 MG tablet, Take 1 tablet (10 mg total) by mouth 2 (two) times daily.   Semaglutide, 2 MG/DOSE, (OZEMPIC, 2 MG/DOSE,) 8 MG/3ML SOPN, Inject 2 mg into the skin once a week.  Current Outpatient Medications (Cardiovascular):    atorvastatin (LIPITOR) 10 MG tablet, Take 1 tablet (10 mg total) by mouth daily.  Current Outpatient Medications (Respiratory):    fexofenadine (ALLEGRA) 180 MG tablet, Take 180 mg by mouth daily as needed for allergies.   fluticasone (FLONASE) 50 MCG/ACT nasal spray, Place 1 spray into both nostrils daily as needed for allergies.   loratadine (CLARITIN) 10 MG tablet, Take by mouth.  Current Outpatient Medications (Analgesics):    acetaminophen (TYLENOL) 500 MG tablet, Take 2,000 mg by mouth every 8 (eight) hours as needed for moderate pain.   Current Outpatient Medications (Other):    ACCU-CHEK GUIDE test strip, USE TO MONITOR BLOOD GLUCOSE 3 TIME(S) DAILY   Accu-Chek Softclix Lancets lancets, 3 (three) times daily.   amitriptyline (ELAVIL) 10 MG tablet, Take by mouth.   anastrozole (ARIMIDEX) 1 MG tablet, Take 1 tablet (1 mg total) by mouth daily.   Blood Glucose Monitoring Suppl (ACCU-CHEK GUIDE ME) w/Device KIT, See admin instructions.   escitalopram (LEXAPRO) 10 MG tablet, Take 1 tablet (10 mg total) by mouth daily.   famotidine (PEPCID) 40 MG tablet, Take 1 tablet (40 mg total) by mouth at bedtime.   hydrOXYzine (ATARAX) 10 MG tablet, Take 1 tablet by mouth 3 (three) times a day as needed for anxiety.   lidocaine (XYLOCAINE) 2 % solution, Use 15 mLs in the mouth or  throat every 3 (three) hours as needed (Throat pain).   Multiple Vitamins-Minerals (MULTIVITAMIN WOMEN) TABS, Take 1 tablet by mouth daily.   nystatin (MYCOSTATIN/NYSTOP) powder, Apply topically 2 (two) times a day as needed.   omeprazole (PRILOSEC) 40 MG capsule, Take 1 capsule (40 mg total) by mouth daily for 8 weeks   pregabalin (LYRICA) 150 MG capsule, Take 1 capsule (150 mg total) by mouth 2 (two) times daily.   triamcinolone cream (KENALOG) 0.1 %, Apply topically 2 (two) times a day as needed.  PHYSICAL EXAM: BP 118/78   Pulse 87   Ht '5\' 3"'$  (1.6 m)   Wt 205 lb 6 oz (93.2 kg)   SpO2 98%   BMI 36.38 kg/m  General:   Pleasant, well developed female in no acute distress Head:   Normocephalic and atraumatic. Eyes:  sclerae anicteric,conjunctive pink  Heart:   regular rate and rhythm Pulm:  Clear anteriorly; no wheezing Abdomen:   Soft, Active bowel sounds. Mildly tender in the LUQ.  Extremities:  Without edema. Msk: Symmetrical without gross deformities. Peripheral pulses intact.  Neurologic:  Alert and  oriented x4;  No focal deficits.  Skin:   Dry and intact without significant lesions or rashes. Psychiatric:  Cooperative. Normal mood and affect.    Sharyn Creamer, MD 1:19  PM  I spent 39 minutes of time, including in depth chart review, independent review of results as outlined above, communicating results with the patient directly, face-to-face time with the patient, coordinating care, and ordering studies and medications as appropriate, and documentation.

## 2022-10-19 NOTE — Patient Instructions (Addendum)
If you are age 45 or younger, your body mass index should be between 19-25. Your Body mass index is 36.38 kg/m. If this is out of the aformentioned range listed, please consider follow up with your Primary Care Provider.  _______________________________________________________  The [place] [facility] providers would like to encourage you to use [facility] to communicate with providers for non-urgent requests or questions.  Due to long hold times on the telephone, sending your provider a message by [facility] may be a faster and more efficient way to get a response.  Please allow 48 business hours for a response.  Please remember that this is for non-urgent requests.  _______________________________________________________  Melody Rodriguez will follow up in our office on an as needed basis.  Thank you for entrusting me with your care and choosing [facility].  Dr Lorenso Courier

## 2022-10-20 ENCOUNTER — Encounter (HOSPITAL_COMMUNITY): Admission: RE | Admit: 2022-10-20 | Payer: Medicaid Other | Source: Ambulatory Visit

## 2022-11-02 ENCOUNTER — Inpatient Hospital Stay: Admit: 2022-11-02 | Payer: Medicaid Other | Admitting: Surgery

## 2022-11-02 ENCOUNTER — Ambulatory Visit: Payer: Medicaid Other

## 2022-11-02 DIAGNOSIS — Z170 Estrogen receptor positive status [ER+]: Secondary | ICD-10-CM

## 2022-11-02 SURGERY — GASTRECTOMY, TOTAL
Anesthesia: General

## 2022-11-02 SURGERY — SALPINGO-OOPHORECTOMY, BILATERAL, ROBOT-ASSISTED
Anesthesia: General | Laterality: Bilateral

## 2022-11-09 ENCOUNTER — Inpatient Hospital Stay: Payer: Medicaid Other | Attending: Hematology and Oncology

## 2022-11-09 VITALS — BP 123/79 | HR 78 | Temp 98.7°F | Resp 18

## 2022-11-09 DIAGNOSIS — Z170 Estrogen receptor positive status [ER+]: Secondary | ICD-10-CM

## 2022-11-09 DIAGNOSIS — C50411 Malignant neoplasm of upper-outer quadrant of right female breast: Secondary | ICD-10-CM | POA: Diagnosis present

## 2022-11-09 DIAGNOSIS — Z5111 Encounter for antineoplastic chemotherapy: Secondary | ICD-10-CM | POA: Diagnosis present

## 2022-11-09 DIAGNOSIS — C773 Secondary and unspecified malignant neoplasm of axilla and upper limb lymph nodes: Secondary | ICD-10-CM | POA: Diagnosis present

## 2022-11-09 MED ORDER — GOSERELIN ACETATE 3.6 MG ~~LOC~~ IMPL
3.6000 mg | DRUG_IMPLANT | Freq: Once | SUBCUTANEOUS | Status: AC
  Administered 2022-11-09: 3.6 mg via SUBCUTANEOUS
  Filled 2022-11-09: qty 3.6

## 2022-11-09 NOTE — Patient Instructions (Signed)
Goserelin Implant What is this medication? GOSERELIN (GOE se rel in) treats prostate cancer and breast cancer. It works by decreasing levels of the hormones testosterone and estrogen in the body. This prevents prostate and breast cancer cells from spreading or growing. It may also be used to treat endometriosis. This is a condition where the tissue that lines the uterus grows outside the uterus. It works by decreasing the amount of estrogen your body makes, which reduces heavy bleeding and pain. It can also be used to help thin the lining of the uterus before a surgery used to prevent or reduce heavy periods. This medicine may be used for other purposes; ask your health care provider or pharmacist if you have questions. COMMON BRAND NAME(S): Zoladex, Zoladex 3-Month What should I tell my care team before I take this medication? They need to know if you have any of these conditions: Bone problems Diabetes Heart disease History of irregular heartbeat or rhythm An unusual or allergic reaction to goserelin, other medications, foods, dyes, or preservatives Pregnant or trying to get pregnant Breastfeeding How should I use this medication? This medication is injected under the skin. It is given by your care team in a hospital or clinic setting. Talk to your care team about the use of this medication in children. Special care may be needed. Overdosage: If you think you have taken too much of this medicine contact a poison control center or emergency room at once. NOTE: This medicine is only for you. Do not share this medicine with others. What if I miss a dose? Keep appointments for follow-up doses. It is important not to miss your dose. Call your care team if you are unable to keep an appointment. What may interact with this medication? Do not take this medication with any of the following: Cisapride Dronedarone Pimozide Thioridazine This medication may also interact with the following: Other  medications that cause heart rhythm changes This list may not describe all possible interactions. Give your health care provider a list of all the medicines, herbs, non-prescription drugs, or dietary supplements you use. Also tell them if you smoke, drink alcohol, or use illegal drugs. Some items may interact with your medicine. What should I watch for while using this medication? Visit your care team for regular checks on your progress. Your symptoms may appear to get worse during the first weeks of this therapy. Tell your care team if your symptoms do not start to get better or if they get worse after this time. Using this medication for a long time may weaken your bones. If you smoke or frequently drink alcohol you may increase your risk of bone loss. A family history of osteoporosis, chronic use of medications for seizures (convulsions), or corticosteroids can also increase your risk of bone loss. The risk of bone fractures may be increased. Talk to your care team about your bone health. This medication may increase blood sugar. The risk may be higher in patients who already have diabetes. Ask your care team what you can do to lower your risk of diabetes while taking this medication. This medication should stop regular monthly menstruation in women. Tell your care team if you continue to menstruate. Talk to your care team if you wish to become pregnant or think you might be pregnant. This medication can cause serious birth defects if taken during pregnancy or for 12 weeks after stopping treatment. Talk to your care team about reliable forms of contraception. Do not breastfeed while taking this   medication. This medication may cause infertility. Talk to your care team if you are concerned about your fertility. What side effects may I notice from receiving this medication? Side effects that you should report to your care team as soon as possible: Allergic reactions--skin rash, itching, hives, swelling  of the face, lips, tongue, or throat Change in the amount of urine Heart attack--pain or tightness in the chest, shoulders, arms, or jaw, nausea, shortness of breath, cold or clammy skin, feeling faint or lightheaded Heart rhythm changes--fast or irregular heartbeat, dizziness, feeling faint or lightheaded, chest pain, trouble breathing High blood sugar (hyperglycemia)--increased thirst or amount of urine, unusual weakness or fatigue, blurry vision High calcium level--increased thirst or amount of urine, nausea, vomiting, confusion, unusual weakness or fatigue, bone pain Pain, redness, irritation, or bruising at the injection site Severe back pain, numbness or weakness of the hands, arms, legs, or feet, loss of coordination, loss of bowel or bladder control Stroke--sudden numbness or weakness of the face, arm, or leg, trouble speaking, confusion, trouble walking, loss of balance or coordination, dizziness, severe headache, change in vision Swelling and pain of the tumor site or lymph nodes Trouble passing urine Side effects that usually do not require medical attention (report to your care team if they continue or are bothersome): Change in sex drive or performance Headache Hot flashes Rapid or extreme change in emotion or mood Sweating Swelling of the ankles, hands, or feet Unusual vaginal discharge, itching, or odor This list may not describe all possible side effects. Call your doctor for medical advice about side effects. You may report side effects to FDA at 1-800-FDA-1088. Where should I keep my medication? This medication is given in a hospital or clinic. It will not be stored at home. NOTE: This sheet is a summary. It may not cover all possible information. If you have questions about this medicine, talk to your doctor, pharmacist, or health care provider.  2023 Elsevier/Gold Standard (2007-10-26 00:00:00)  

## 2022-11-13 ENCOUNTER — Ambulatory Visit
Admission: RE | Admit: 2022-11-13 | Discharge: 2022-11-13 | Disposition: A | Discharge status: Home / Self Care | Payer: Medicaid Other | Source: Ambulatory Visit | Attending: Hematology and Oncology | Admitting: Hematology and Oncology

## 2022-11-13 ENCOUNTER — Encounter: Payer: Self-pay | Admitting: Hematology and Oncology

## 2022-11-13 DIAGNOSIS — Z170 Estrogen receptor positive status [ER+]: Secondary | ICD-10-CM

## 2022-11-18 ENCOUNTER — Other Ambulatory Visit: Payer: Self-pay | Admitting: Internal Medicine

## 2022-11-18 ENCOUNTER — Other Ambulatory Visit: Payer: Self-pay | Admitting: Physician Assistant

## 2022-11-18 ENCOUNTER — Other Ambulatory Visit (HOSPITAL_COMMUNITY): Payer: Self-pay

## 2022-11-18 DIAGNOSIS — R1310 Dysphagia, unspecified: Secondary | ICD-10-CM

## 2022-11-18 DIAGNOSIS — K219 Gastro-esophageal reflux disease without esophagitis: Secondary | ICD-10-CM

## 2022-11-19 ENCOUNTER — Other Ambulatory Visit (HOSPITAL_COMMUNITY): Payer: Self-pay

## 2022-11-19 MED ORDER — OMEPRAZOLE 40 MG PO CPDR
40.0000 mg | DELAYED_RELEASE_CAPSULE | Freq: Every day | ORAL | 1 refills | Status: DC
  Filled 2022-11-19: qty 30, 30d supply, fill #0
  Filled 2022-12-29: qty 30, 30d supply, fill #1

## 2022-11-20 ENCOUNTER — Other Ambulatory Visit: Payer: Self-pay

## 2022-11-20 ENCOUNTER — Encounter: Payer: Self-pay | Admitting: Hematology and Oncology

## 2022-11-20 ENCOUNTER — Other Ambulatory Visit (HOSPITAL_COMMUNITY): Payer: Self-pay

## 2022-11-20 MED ORDER — OZEMPIC (2 MG/DOSE) 8 MG/3ML ~~LOC~~ SOPN
PEN_INJECTOR | SUBCUTANEOUS | 3 refills | Status: DC
  Filled 2022-11-20: qty 3, 28d supply, fill #0
  Filled 2022-12-29: qty 3, 28d supply, fill #1
  Filled 2023-01-29: qty 3, 28d supply, fill #2
  Filled 2023-03-07: qty 3, 28d supply, fill #3

## 2022-11-20 MED ORDER — GLIPIZIDE 10 MG PO TABS
10.0000 mg | ORAL_TABLET | Freq: Two times a day (BID) | ORAL | 2 refills | Status: DC
  Filled 2022-11-20: qty 60, 30d supply, fill #0

## 2022-11-20 MED ORDER — FAMOTIDINE 40 MG PO TABS
40.0000 mg | ORAL_TABLET | Freq: Every day | ORAL | 2 refills | Status: DC
  Filled 2022-11-20: qty 30, 30d supply, fill #0
  Filled 2022-12-29: qty 30, 30d supply, fill #1
  Filled 2023-04-07: qty 30, 30d supply, fill #2

## 2022-11-22 ENCOUNTER — Other Ambulatory Visit (HOSPITAL_COMMUNITY): Payer: Self-pay

## 2022-11-22 MED ORDER — ONDANSETRON HCL 4 MG PO TABS
ORAL_TABLET | ORAL | 0 refills | Status: DC
  Filled 2022-11-22: qty 30, 10d supply, fill #0

## 2022-11-27 ENCOUNTER — Ambulatory Visit: Payer: Medicaid Other | Attending: Adult Health

## 2022-11-27 ENCOUNTER — Other Ambulatory Visit (HOSPITAL_COMMUNITY): Payer: Self-pay

## 2022-11-27 VITALS — Wt 190.0 lb

## 2022-11-27 DIAGNOSIS — Z483 Aftercare following surgery for neoplasm: Secondary | ICD-10-CM | POA: Insufficient documentation

## 2022-11-27 NOTE — Therapy (Signed)
OUTPATIENT PHYSICAL THERAPY SOZO SCREENING NOTE   Patient Name: Melody Rodriguez MRN: [id number] DOB:1975/12/22, 47 y.o., female Today's Date: 11/27/2022  PCP: Myrtie Neither, PA-C REFERRING PROVIDER: Wilber Bihari Cornett*   PT End of Session - 11/27/22 0925     Visit Number 10   # unchanged due to screen only   PT Start Time 0922    PT Stop Time 0927    PT Time Calculation (min) 5 min    Activity Tolerance Patient tolerated treatment well    Behavior During Therapy [facility] for tasks assessed/performed             Past Medical History:  Diagnosis Date   Anemia    Anxiety    Breast cancer ([place])    right breast cancer   Depression    Diabetes mellitus without complication ([place])    glipizide and actps; cbgs 200s fasting   Family history of colon cancer    GERD (gastroesophageal reflux disease)    Headache    History of goiter    History of radiation therapy    right breast/scv  05/10/21-07/01/21  Dr Gery Pray   Hyperlipidemia    Monoallelic mutation of CTNNA3 gene    Recurrent major depressive disorder [facility])    Past Surgical History:  Procedure Laterality Date   BREAST BIOPSY Right 02/18/2021   Procedure: EVACUATION HEMATOMA RIGHT AXILLA;  Surgeon: [name], MD;  Location: [place];  Service: General;  Laterality: Right;   BREAST CYST EXCISION Right    Patient does not recall (2014 or 2015)   BREAST LUMPECTOMY WITH RADIOACTIVE SEED AND SENTINEL LYMPH NODE BIOPSY Right 02/07/2021   Procedure: RIGHT BREAST LUMPECTOMY WITH RADIOACTIVE SEED AND SEED TARGETED LYMPH NODE BIOPSY AND SENTINEL LYMPH NODE BIOPSY;  Surgeon: [name], MD;  Location: [place];  Service: General;  Laterality: Right;   CESAREAN SECTION     x2   IR IMAGING GUIDED PORT INSERTION  09/15/2020   IR REMOVAL TUN CV CATH W/O FL  09/15/2020   NODE DISSECTION Right 03/28/2021   Procedure: RIGHT AXILLARY LYMPH NODE DISSECTION;  Surgeon: [name], MD;  Location: [place];  Service: General;  Laterality: Right;   PORT-A-CATH REMOVAL Left 02/07/2021   Procedure: REMOVAL PORT-A-CATH;  Surgeon: [name], MD;  Location: [place];  Service: General;  Laterality: Left;   THYROIDECTOMY, PARTIAL     Patient Active Problem List   Diagnosis Date Noted   Gene mutation 03/03/2022   Type 2 diabetes mellitus ([place]) 01/24/2022   Pap smear for cervical cancer screening 08/19/2021   Amenorrhea, secondary 03/02/2021   Genetic testing 08/11/2020   Iron deficiency anemia due to chronic blood loss 08/04/2020   Family history of colon cancer    Malignant neoplasm of upper-outer quadrant of right breast in female, estrogen receptor positive ([place]) 07/28/2020    REFERRING DIAG: right breast cancer at risk for lymphedema  THERAPY DIAG: Aftercare following surgery for neoplasm  PERTINENT HISTORY: Patient was diagnosed on 07/13/2020 with right grade 2 invasive ductal carcinoma breast cancer. It is ER/PR positive and HER2 negative with a Ki67 of 20%. Patient underwent neoadjuvant chemotherapy from 09/01/2020 - 01/14/2021. She had a right lumpectomy and sentinel node biopsy (1/1 nodes positive) on 02/07/2021. Hematoma evacuation on 02/18/2021. Axillary dissection on 03/28/2021 with 3/7 positive nodes. Iron deficiency. Radiation  05/10/2021-06/2021  Diagnosis of Type 2 diabetes in Nov. 2022.  Pt has had 2 steroid  shots in right shoulder with only about 2 hours of relief.  She was advised not to have any more steroid shots due to her Diabetes   PRECAUTIONS: right UE Lymphedema risk, None  SUBJECTIVE: Pt returns for her 3 month L-Dex screen.   PAIN:  Are you having pain? No  SOZO SCREENING: Patient was assessed today using the SOZO machine to determine the lymphedema index score. This was compared to her baseline score. It was determined that she is within the recommended range when compared to her baseline and no  further action is needed at this time. She will continue SOZO screenings. These are done every 3 months for 2 years post operatively followed by every 6 months for 2 years, and then annually.  P: Will begin 6 month screens next.    L-DEX FLOWSHEETS - 11/27/22 0900       L-DEX LYMPHEDEMA SCREENING   Measurement Type Unilateral    L-DEX MEASUREMENT EXTREMITY Upper Extremity    POSITION  Standing    DOMINANT SIDE Left    At Risk Side Left    BASELINE SCORE (UNILATERAL) 0.4    L-DEX SCORE (UNILATERAL) -2.1    VALUE CHANGE (UNILAT) -2.5              Otelia Limes, PTA 11/27/2022, 9:27 AM

## 2022-11-30 ENCOUNTER — Ambulatory Visit: Payer: Medicaid Other

## 2022-12-04 ENCOUNTER — Encounter: Payer: Self-pay | Admitting: Physical Medicine & Rehabilitation

## 2022-12-04 ENCOUNTER — Encounter: Payer: Medicaid Other | Attending: Physical Medicine & Rehabilitation | Admitting: Physical Medicine & Rehabilitation

## 2022-12-04 VITALS — BP 141/89 | HR 79 | Ht 63.0 in | Wt 192.4 lb

## 2022-12-04 DIAGNOSIS — G90511 Complex regional pain syndrome I of right upper limb: Secondary | ICD-10-CM

## 2022-12-04 DIAGNOSIS — G8929 Other chronic pain: Secondary | ICD-10-CM | POA: Insufficient documentation

## 2022-12-04 DIAGNOSIS — M79601 Pain in right arm: Secondary | ICD-10-CM | POA: Diagnosis not present

## 2022-12-04 DIAGNOSIS — F0630 Mood disorder due to known physiological condition, unspecified: Secondary | ICD-10-CM

## 2022-12-04 NOTE — Progress Notes (Signed)
Subjective:    Patient ID: Melody Rodriguez, female    DOB: 1976/04/30, 47 y.o.   MRN: [id number]  HPI HPI 60/30 47 year old female with past medical history of diabetes mellitus, breast cancer status post lumpectomy with radioactive seeds 02/07/2021, breast biopsy 02/18/2021 and node dissection 03/28/2021.  Patient reports she began having pain in her right upper extremity after her surgeries for treatment of breast cancer.  She developed pain in her shoulder and the pain spread to her entire arm.  Her arm is very sensitive to touch and has increased swelling.  She reports it has appeared red and swollen at times.  She has difficulty flexing or abducting her shoulder.  She keeps the arm by her side with elbow flexed and wrist flexed.  She had worked with PT although she had to stop it due to pain.  She reports the function in her right arm improved after the physical therapy.  She also has pain when she looks to the right with her head.  She cannot sleep on her right side and uses pillows to avoid sensation to her right arm.  Mobic does not provide significant benefit, 2x shoulder injections did not help the pain.  She reports her mood is decreased due to the pain.   Visit 06/19/22 [name] is here for follow-up of her right upper extremity pain.  She reports pain is roughly similar to her last visit.  She is able to reach her arm a little bit higher to get things on shelves at home.  She still cannot lift above her shoulder level on her right.  She continues to have burning, tingling and stabbing pains in her right upper back, shoulder, upper arm, elbow and sometimes hand and wrist.  She is trying to use her arm for more activities at home however says this is difficult.  She is not interested in physical therapy at this time.  Patient reports she is taking gabapentin 100 mg 3 times a day.  She says this is causing her some mild sedation.     Interval History 10/03/22 [name] is here for follow-up of  her chronic pain in her right upper extremity.  She continues to have pain in her arm, shoulder and around lats on her right side.  She reports Lyrica did improve the pain a little bit.  She is not having any significant side effects with the medication.  She continues to have difficulty using her upper extremity especially for activities where her shoulder is abducted.  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  She has a visit with oncology to discuss treatment options for this cancer.  Interval History 12/04/22 [name] is here for her chronic pain follow-up.  She reports pain is largely unchanged from last visit.  She is not having any side effects with the Lyrica.  She continues to have pain and weakness in her shoulder.  She is not interested in injections at this time.  She would not like to retry physical therapy.  She also does not want to try any new medications, although prior medication fills have been about $4, she says multiple medications would be too costly for her at this time.  She also feels like she has tried enough medications for the time being.  We discussed trying a higher dose of Lyrica, she says she will contact me when she is out of her current medication and consider an increased dose.  She reports decreased mood however no SI or HI.   Pain Inventory Average Pain 7 Pain Right Now 7 My pain is constant, sharp, burning, dull, stabbing, tingling, and aching  In the last 24 hours, has pain interfered with the following? General activity 7 Relation with others 7 Enjoyment of life 9 What TIME of day is your pain at its worst? morning , daytime, evening, and night Sleep (in general) Poor  Pain is worse with: walking, bending, sitting, inactivity, standing, and some activites Pain improves with: medication Relief from Meds: 1  Family History  Problem Relation Age of Onset   Hypertension Mother    Colon cancer Sister 30   Cancer Maternal  Aunt        unknown type dx late 36s   Diabetes Maternal Grandmother    Breast cancer Neg Hx    Ovarian cancer Neg Hx    Endometrial cancer Neg Hx    Pancreatic cancer Neg Hx    Prostate cancer Neg Hx    Esophageal cancer Neg Hx    Stomach cancer Neg Hx    Social History   Socioeconomic History   Marital status: Single    Spouse name: Not on file   Number of children: 2   Years of education: Not on file   Highest education level: Some college, no degree  Occupational History   Occupation: unemployed  Tobacco Use   Smoking status: Former    Packs/day: 1.00    Years: 22.00    Additional pack years: 0.00    Total pack years: 22.00    Types: Cigars, Cigarettes   Smokeless tobacco: Never   Tobacco comments:    quit 07/2020  Vaping Use   Vaping Use: Never used  Substance and Sexual Activity   Alcohol use: Yes    Comment: socially   Drug use: Yes    Types: Marijuana    Comment: daily   Sexual activity: Not Currently  Other Topics Concern   Not on file  Social History Narrative   Not on file   Social Determinants of Health   Financial Resource Strain: High Risk (08/29/2021)   Overall Financial Resource Strain (CARDIA)    Difficulty of Paying Living Expenses: Hard  Food Insecurity: Food Insecurity Present (08/29/2021)   Hunger Vital Sign    Worried About Running Out of Food in the Last Year: Sometimes true    Ran Out of Food in the Last Year: Sometimes true  Transportation Needs: No Transportation Needs (08/29/2021)   PRAPARE - Hydrologist (Medical): No    Lack of Transportation (Non-Medical): No  Physical Activity: Inactive (08/29/2021)   Exercise Vital Sign    Days of Exercise per Week: 0 days    Minutes of Exercise per Session: 0 min  Stress: Stress Concern Present (08/29/2021)   [place]    Feeling of Stress : To some extent  Social Connections: Socially  Isolated (08/29/2021)   Social Connection and Isolation Panel [NHANES]    Frequency of Communication with Friends and Family: Three times a week    Frequency of Social Gatherings with Friends and Family: More than three times a week    Attends Religious Services: Never    Marine scientist or Organizations: No    Attends Archivist Meetings: Never    Marital Status: Never married   Past Surgical History:  Procedure Laterality Date   BREAST  BIOPSY Right 02/18/2021   Procedure: EVACUATION HEMATOMA RIGHT AXILLA;  Surgeon: [name], MD;  Location: [place];  Service: General;  Laterality: Right;   BREAST CYST EXCISION Right    Patient does not recall (2014 or 2015)   BREAST LUMPECTOMY WITH RADIOACTIVE SEED AND SENTINEL LYMPH NODE BIOPSY Right 02/07/2021   Procedure: RIGHT BREAST LUMPECTOMY WITH RADIOACTIVE SEED AND SEED TARGETED LYMPH NODE BIOPSY AND SENTINEL LYMPH NODE BIOPSY;  Surgeon: [name], MD;  Location: [place];  Service: General;  Laterality: Right;   CESAREAN SECTION     x2   IR IMAGING GUIDED PORT INSERTION  09/15/2020   IR REMOVAL TUN CV CATH W/O FL  09/15/2020   NODE DISSECTION Right 03/28/2021   Procedure: RIGHT AXILLARY LYMPH NODE DISSECTION;  Surgeon: [name], MD;  Location: [place];  Service: General;  Laterality: Right;   PORT-A-CATH REMOVAL Left 02/07/2021   Procedure: REMOVAL PORT-A-CATH;  Surgeon: [name], MD;  Location: [place];  Service: General;  Laterality: Left;   THYROIDECTOMY, PARTIAL     Past Surgical History:  Procedure Laterality Date   BREAST BIOPSY Right 02/18/2021   Procedure: EVACUATION HEMATOMA RIGHT AXILLA;  Surgeon: [name], MD;  Location: [place];  Service: General;  Laterality: Right;   BREAST CYST EXCISION Right    Patient does not recall (2014 or 2015)   BREAST LUMPECTOMY WITH RADIOACTIVE SEED  AND SENTINEL LYMPH NODE BIOPSY Right 02/07/2021   Procedure: RIGHT BREAST LUMPECTOMY WITH RADIOACTIVE SEED AND SEED TARGETED LYMPH NODE BIOPSY AND SENTINEL LYMPH NODE BIOPSY;  Surgeon: [name], MD;  Location: [place];  Service: General;  Laterality: Right;   CESAREAN SECTION     x2   IR IMAGING GUIDED PORT INSERTION  09/15/2020   IR REMOVAL TUN CV CATH W/O FL  09/15/2020   NODE DISSECTION Right 03/28/2021   Procedure: RIGHT AXILLARY LYMPH NODE DISSECTION;  Surgeon: [name], MD;  Location: [place];  Service: General;  Laterality: Right;   PORT-A-CATH REMOVAL Left 02/07/2021   Procedure: REMOVAL PORT-A-CATH;  Surgeon: [name], MD;  Location: [place];  Service: General;  Laterality: Left;   THYROIDECTOMY, PARTIAL     Past Medical History:  Diagnosis Date   Anemia    Anxiety    Breast cancer ([place])    right breast cancer   Depression    Diabetes mellitus without complication ([place])    glipizide and actps; cbgs 200s fasting   Family history of colon cancer    GERD (gastroesophageal reflux disease)    Headache    History of goiter    History of radiation therapy    right breast/scv  05/10/21-07/01/21  Dr Gery Pray   Hyperlipidemia    Monoallelic mutation of CTNNA3 gene    Recurrent major depressive disorder ([place])    BP (!) 141/89   Pulse 79   Ht 5\' 3"  (1.6 m)   Wt 192 lb 6.4 oz (87.3 kg)   SpO2 96%   BMI 34.08 kg/m   Opioid Risk Score:   Fall Risk Score:  `1  Depression screen [facility] 2/9     10/03/2022    9:35 AM 07/31/2022   10:36 AM 06/19/2022   10:46 AM 05/23/2022    9:38 AM 04/13/2022   10:11 AM 03/14/2022    9:50 AM 08/29/2021    9:00 AM  Depression screen PHQ 2/9  Decreased Interest  3 3 3  0 1 3   Down, Depressed, Hopeless 3 3 3  0 1 3 1   PHQ - 2 Score 6 6 6  0 2 6 1   Altered sleeping      3 1  Tired, decreased energy      3 2  Change in appetite      3 0  Feeling bad or failure about  yourself       3   Trouble concentrating      3 3  Moving slowly or fidgety/restless      3 0  Suicidal thoughts      1 0  PHQ-9 Score      25 7  Difficult doing work/chores      Very difficult      Review of Systems  Musculoskeletal:        Right arm pain  All other systems reviewed and are negative.     Objective:   Physical Exam   Gen: no distress, normal appearing HEENT: oral mucosa pink and moist, NCAT Chest: normal effort, normal rate of breathing Abd: soft, non-distended Ext: no edema Psych: appropriate, normal affect   Skin: intact Neuro: Alert and oriented, sensation intact in all 4 extremities, DTR normal and symmetric Musculoskeletal:  Strength 5/5 in LUE and B/L LE Strength at least 4/5 RUE limited by pain Limited active range of motion to about 80 degrees abduction of the right shoulder-unchanged Pain to palpation throughout RUE particularly the shoulder, upper arm, elbow, lats Mild swelling in right upper extremity compared to left upper extremity No paraspinal tenderness     MRI shoulder R IMPRESSION: 1. Mild distal infraspinatus tendinosis. No evidence of rotator cuff tear. No evidence of labral tear. 2. Faint intramuscular edema within teres minor, consistent with low-grade muscle strain. 3. Mild soft tissue edema within the right axilla compatible with history of prior axillary lymph node dissection.        Assessment & Plan:   Right upper extremity pain. Suspicious for CPRS versus neuropathic pain related to cancer treatment, diagnosis complicated by edema due to lymph node dissection. Suspect she also has component of frozen shoulder although this by itself wouldn't explain the entirely of her diffuse pain throughout her RUE.  Prior MRI shows infraspinatus tendinitis -Duloxetine 30 mg stopped previously due to rash -Gabapentin caused sedation in the past she has stopped using amitriptyline as well -Continue meloxicam -Continue home exercise  program, She declines restart formal PT -Triple phase bone scan was negative -Continue Lyrica to 150 mg twice daily- Pt declines increase in medication today, she says she will consider this when she runs of out her current medication supply.  Consider increase in lyrica if she is agreeable.  -Discussed option to try medication like venlafaxine, patient declines any new medications -Discussed option for sympathetic nerve block patient declines -Discussed option for TENS unit, she is not interested at this time   Mood disorder, Sleep disorder -Denies SI or HI -She had a rash with duloxetine but venlafaxine could be an option.  She is not interested in this medication

## 2022-12-05 ENCOUNTER — Other Ambulatory Visit (HOSPITAL_COMMUNITY): Payer: Self-pay

## 2022-12-05 MED ORDER — ONDANSETRON HCL 4 MG PO TABS
4.0000 mg | ORAL_TABLET | Freq: Three times a day (TID) | ORAL | 2 refills | Status: DC | PRN
  Filled 2022-12-05: qty 30, 10d supply, fill #0
  Filled 2022-12-29: qty 30, 10d supply, fill #1
  Filled 2023-03-07: qty 30, 10d supply, fill #2

## 2022-12-07 ENCOUNTER — Inpatient Hospital Stay: Payer: Medicaid Other | Attending: Hematology and Oncology

## 2022-12-07 VITALS — BP 139/92 | HR 69 | Temp 98.9°F | Resp 18

## 2022-12-07 DIAGNOSIS — Z79811 Long term (current) use of aromatase inhibitors: Secondary | ICD-10-CM | POA: Diagnosis not present

## 2022-12-07 DIAGNOSIS — Z5111 Encounter for antineoplastic chemotherapy: Secondary | ICD-10-CM | POA: Insufficient documentation

## 2022-12-07 DIAGNOSIS — Z170 Estrogen receptor positive status [ER+]: Secondary | ICD-10-CM | POA: Insufficient documentation

## 2022-12-07 DIAGNOSIS — C50411 Malignant neoplasm of upper-outer quadrant of right female breast: Secondary | ICD-10-CM | POA: Insufficient documentation

## 2022-12-07 DIAGNOSIS — C773 Secondary and unspecified malignant neoplasm of axilla and upper limb lymph nodes: Secondary | ICD-10-CM | POA: Diagnosis present

## 2022-12-07 MED ORDER — GOSERELIN ACETATE 3.6 MG ~~LOC~~ IMPL
3.6000 mg | DRUG_IMPLANT | Freq: Once | SUBCUTANEOUS | Status: AC
  Administered 2022-12-07: 3.6 mg via SUBCUTANEOUS
  Filled 2022-12-07: qty 3.6

## 2022-12-20 ENCOUNTER — Encounter: Payer: Self-pay | Admitting: Gynecologic Oncology

## 2022-12-22 ENCOUNTER — Telehealth: Payer: Self-pay | Admitting: Oncology

## 2022-12-22 NOTE — Telephone Encounter (Signed)
Left a message for Dr. Elon Jester nurse at the [facility] regarding plan of care.  Requested a return call.

## 2022-12-27 ENCOUNTER — Telehealth: Payer: Self-pay

## 2022-12-27 ENCOUNTER — Encounter: Payer: Self-pay | Admitting: Gynecologic Oncology

## 2022-12-27 NOTE — Telephone Encounter (Signed)
See below message from Warner Mccreedy NP  Pt is scheduled to see [name] on 5/23 @ 8:45 (approved by [facility])  Pt will also reach out to Dr. Pamelia Hoit regarding the Zoladex injection.

## 2022-12-27 NOTE — Telephone Encounter (Signed)
-----   Message from Doylene Bode, NP sent at 12/27/2022  1:06 PM EDT ----- This patient had sent a message about getting her BSO surgery rescheduled.  She needs to be seen in the office by [name] to discuss since it has been a while since we have seen her.    In regards to her message about the Zoladex shot, that would be up to her medical oncologist.  From our end of things we would say yes since surgery openings or not until mid to late May but would defer this decision to med onc

## 2022-12-29 ENCOUNTER — Encounter: Payer: Self-pay | Admitting: Hematology and Oncology

## 2022-12-29 ENCOUNTER — Other Ambulatory Visit (HOSPITAL_COMMUNITY): Payer: Self-pay

## 2022-12-29 ENCOUNTER — Other Ambulatory Visit: Payer: Self-pay

## 2022-12-29 MED ORDER — ESCITALOPRAM OXALATE 10 MG PO TABS
10.0000 mg | ORAL_TABLET | Freq: Every day | ORAL | 2 refills | Status: DC
  Filled 2022-12-29: qty 30, 30d supply, fill #0

## 2023-01-04 ENCOUNTER — Ambulatory Visit: Payer: Medicaid Other

## 2023-01-04 ENCOUNTER — Telehealth: Payer: Self-pay | Admitting: Hematology and Oncology

## 2023-01-04 NOTE — Telephone Encounter (Signed)
Scheduled appointments per scheduling message. Patient is aware of the made appointments.  

## 2023-01-08 ENCOUNTER — Encounter: Payer: Self-pay | Admitting: Hematology and Oncology

## 2023-01-08 ENCOUNTER — Inpatient Hospital Stay: Payer: Medicaid Other | Attending: Hematology and Oncology

## 2023-01-08 VITALS — BP 139/91 | HR 77 | Temp 98.9°F | Resp 18

## 2023-01-08 DIAGNOSIS — Z79811 Long term (current) use of aromatase inhibitors: Secondary | ICD-10-CM | POA: Insufficient documentation

## 2023-01-08 DIAGNOSIS — Z5111 Encounter for antineoplastic chemotherapy: Secondary | ICD-10-CM | POA: Insufficient documentation

## 2023-01-08 DIAGNOSIS — C773 Secondary and unspecified malignant neoplasm of axilla and upper limb lymph nodes: Secondary | ICD-10-CM | POA: Diagnosis present

## 2023-01-08 DIAGNOSIS — Z923 Personal history of irradiation: Secondary | ICD-10-CM | POA: Insufficient documentation

## 2023-01-08 DIAGNOSIS — Z170 Estrogen receptor positive status [ER+]: Secondary | ICD-10-CM | POA: Insufficient documentation

## 2023-01-08 DIAGNOSIS — C50411 Malignant neoplasm of upper-outer quadrant of right female breast: Secondary | ICD-10-CM | POA: Insufficient documentation

## 2023-01-08 MED ORDER — GOSERELIN ACETATE 3.6 MG ~~LOC~~ IMPL
3.6000 mg | DRUG_IMPLANT | Freq: Once | SUBCUTANEOUS | Status: AC
  Administered 2023-01-08: 3.6 mg via SUBCUTANEOUS
  Filled 2023-01-08: qty 3.6

## 2023-01-08 NOTE — Patient Instructions (Signed)
Goserelin Implant What is this medication? GOSERELIN (GOE se rel in) treats prostate cancer and breast cancer. It works by decreasing levels of the hormones testosterone and estrogen in the body. This prevents prostate and breast cancer cells from spreading or growing. It may also be used to treat endometriosis. This is a condition where the tissue that lines the uterus grows outside the uterus. It works by decreasing the amount of estrogen your body makes, which reduces heavy bleeding and pain. It can also be used to help thin the lining of the uterus before a surgery used to prevent or reduce heavy periods. This medicine may be used for other purposes; ask your health care provider or pharmacist if you have questions. COMMON BRAND NAME(S): Zoladex, Zoladex 3-Month What should I tell my care team before I take this medication? They need to know if you have any of these conditions: Bone problems Diabetes Heart disease History of irregular heartbeat or rhythm An unusual or allergic reaction to goserelin, other medications, foods, dyes, or preservatives Pregnant or trying to get pregnant Breastfeeding How should I use this medication? This medication is injected under the skin. It is given by your care team in a hospital or clinic setting. Talk to your care team about the use of this medication in children. Special care may be needed. Overdosage: If you think you have taken too much of this medicine contact a poison control center or emergency room at once. NOTE: This medicine is only for you. Do not share this medicine with others. What if I miss a dose? Keep appointments for follow-up doses. It is important not to miss your dose. Call your care team if you are unable to keep an appointment. What may interact with this medication? Do not take this medication with any of the following: Cisapride Dronedarone Pimozide Thioridazine This medication may also interact with the following: Other  medications that cause heart rhythm changes This list may not describe all possible interactions. Give your health care provider a list of all the medicines, herbs, non-prescription drugs, or dietary supplements you use. Also tell them if you smoke, drink alcohol, or use illegal drugs. Some items may interact with your medicine. What should I watch for while using this medication? Visit your care team for regular checks on your progress. Your symptoms may appear to get worse during the first weeks of this therapy. Tell your care team if your symptoms do not start to get better or if they get worse after this time. Using this medication for a long time may weaken your bones. If you smoke or frequently drink alcohol you may increase your risk of bone loss. A family history of osteoporosis, chronic use of medications for seizures (convulsions), or corticosteroids can also increase your risk of bone loss. The risk of bone fractures may be increased. Talk to your care team about your bone health. This medication may increase blood sugar. The risk may be higher in patients who already have diabetes. Ask your care team what you can do to lower your risk of diabetes while taking this medication. This medication should stop regular monthly menstruation in women. Tell your care team if you continue to menstruate. Talk to your care team if you wish to become pregnant or think you might be pregnant. This medication can cause serious birth defects if taken during pregnancy or for 12 weeks after stopping treatment. Talk to your care team about reliable forms of contraception. Do not breastfeed while taking this   medication. This medication may cause infertility. Talk to your care team if you are concerned about your fertility. What side effects may I notice from receiving this medication? Side effects that you should report to your care team as soon as possible: Allergic reactions--skin rash, itching, hives, swelling  of the face, lips, tongue, or throat Change in the amount of urine Heart attack--pain or tightness in the chest, shoulders, arms, or jaw, nausea, shortness of breath, cold or clammy skin, feeling faint or lightheaded Heart rhythm changes--fast or irregular heartbeat, dizziness, feeling faint or lightheaded, chest pain, trouble breathing High blood sugar (hyperglycemia)--increased thirst or amount of urine, unusual weakness or fatigue, blurry vision High calcium level--increased thirst or amount of urine, nausea, vomiting, confusion, unusual weakness or fatigue, bone pain Pain, redness, irritation, or bruising at the injection site Severe back pain, numbness or weakness of the hands, arms, legs, or feet, loss of coordination, loss of bowel or bladder control Stroke--sudden numbness or weakness of the face, arm, or leg, trouble speaking, confusion, trouble walking, loss of balance or coordination, dizziness, severe headache, change in vision Swelling and pain of the tumor site or lymph nodes Trouble passing urine Side effects that usually do not require medical attention (report to your care team if they continue or are bothersome): Change in sex drive or performance Headache Hot flashes Rapid or extreme change in emotion or mood Sweating Swelling of the ankles, hands, or feet Unusual vaginal discharge, itching, or odor This list may not describe all possible side effects. Call your doctor for medical advice about side effects. You may report side effects to FDA at 1-800-FDA-1088. Where should I keep my medication? This medication is given in a hospital or clinic. It will not be stored at home. NOTE: This sheet is a summary. It may not cover all possible information. If you have questions about this medicine, talk to your doctor, pharmacist, or health care provider.  2023 Elsevier/Gold Standard (2022-01-18 00:00:00)  

## 2023-01-10 ENCOUNTER — Encounter: Payer: Self-pay | Admitting: Internal Medicine

## 2023-01-12 NOTE — Telephone Encounter (Signed)
Called and spoke to the patient. Dr. Kathrynn Ducking her surgical oncologist at [facility] has recommended that she get an EGD every 3 months for service. She would prefer to get her EGDs in [place] for now. I can plan to send the results of her EGD and pathology reports to Dr. Kathrynn Ducking to see what he recommends in the future.   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

## 2023-01-24 ENCOUNTER — Other Ambulatory Visit: Payer: Self-pay | Admitting: Hematology and Oncology

## 2023-01-25 ENCOUNTER — Ambulatory Visit (AMBULATORY_SURGERY_CENTER): Payer: Medicaid Other | Admitting: *Deleted

## 2023-01-25 VITALS — Ht 63.0 in | Wt 190.0 lb

## 2023-01-25 DIAGNOSIS — Z1509 Genetic susceptibility to other malignant neoplasm: Secondary | ICD-10-CM

## 2023-01-25 NOTE — Progress Notes (Signed)
Pt's previsit is done over the phone and all paperwork (prep instructions) sent to patient.  Pt's name and DOB verified at the beginning of the previsit.  Pt denies any difficulty with ambulating.   No egg or soy allergy known to patient  No issues known to pt with past sedation with any surgeries or procedures Patient denies ever being told they had issues or difficulty with intubation  No FH of Malignant Hyperthermia Pt is not on diet pills Pt is not on  home 02  Pt is not on blood thinners  Pt denies issues with constipation  Pt is not on dialysis Pt denies any upcoming cardiac testing Pt encouraged to use to use Singlecare or Goodrx to reduce cost  Patient's chart reviewed by John Nulty CNRA prior to previsit and patient appropriate for the [facility].  Previsit completed and red dot placed by patient's name on their procedure day (on provider's schedule).    

## 2023-02-01 ENCOUNTER — Ambulatory Visit: Payer: Medicaid Other

## 2023-02-02 ENCOUNTER — Ambulatory Visit: Payer: Medicaid Other | Admitting: Physical Medicine & Rehabilitation

## 2023-02-05 ENCOUNTER — Inpatient Hospital Stay: Payer: Medicaid Other | Attending: Hematology and Oncology

## 2023-02-05 ENCOUNTER — Encounter: Payer: Self-pay | Admitting: Physical Medicine & Rehabilitation

## 2023-02-05 ENCOUNTER — Other Ambulatory Visit (HOSPITAL_COMMUNITY): Payer: Self-pay

## 2023-02-05 ENCOUNTER — Encounter: Payer: Medicaid Other | Attending: Physical Medicine & Rehabilitation | Admitting: Physical Medicine & Rehabilitation

## 2023-02-05 VITALS — BP 112/79 | HR 84 | Ht 63.0 in | Wt 178.0 lb

## 2023-02-05 VITALS — BP 121/77 | HR 82 | Temp 98.6°F | Resp 18

## 2023-02-05 DIAGNOSIS — Z79811 Long term (current) use of aromatase inhibitors: Secondary | ICD-10-CM | POA: Insufficient documentation

## 2023-02-05 DIAGNOSIS — Z923 Personal history of irradiation: Secondary | ICD-10-CM | POA: Insufficient documentation

## 2023-02-05 DIAGNOSIS — Z87891 Personal history of nicotine dependence: Secondary | ICD-10-CM | POA: Diagnosis not present

## 2023-02-05 DIAGNOSIS — Z85028 Personal history of other malignant neoplasm of stomach: Secondary | ICD-10-CM | POA: Diagnosis not present

## 2023-02-05 DIAGNOSIS — F0630 Mood disorder due to known physiological condition, unspecified: Secondary | ICD-10-CM | POA: Insufficient documentation

## 2023-02-05 DIAGNOSIS — Z79899 Other long term (current) drug therapy: Secondary | ICD-10-CM | POA: Insufficient documentation

## 2023-02-05 DIAGNOSIS — C50411 Malignant neoplasm of upper-outer quadrant of right female breast: Secondary | ICD-10-CM | POA: Diagnosis present

## 2023-02-05 DIAGNOSIS — Z7984 Long term (current) use of oral hypoglycemic drugs: Secondary | ICD-10-CM | POA: Insufficient documentation

## 2023-02-05 DIAGNOSIS — Z1501 Genetic susceptibility to malignant neoplasm of breast: Secondary | ICD-10-CM | POA: Insufficient documentation

## 2023-02-05 DIAGNOSIS — Z9221 Personal history of antineoplastic chemotherapy: Secondary | ICD-10-CM | POA: Diagnosis not present

## 2023-02-05 DIAGNOSIS — I10 Essential (primary) hypertension: Secondary | ICD-10-CM | POA: Insufficient documentation

## 2023-02-05 DIAGNOSIS — E2839 Other primary ovarian failure: Secondary | ICD-10-CM | POA: Insufficient documentation

## 2023-02-05 DIAGNOSIS — Z1502 Genetic susceptibility to malignant neoplasm of ovary: Secondary | ICD-10-CM | POA: Insufficient documentation

## 2023-02-05 DIAGNOSIS — E119 Type 2 diabetes mellitus without complications: Secondary | ICD-10-CM | POA: Diagnosis not present

## 2023-02-05 DIAGNOSIS — G90511 Complex regional pain syndrome I of right upper limb: Secondary | ICD-10-CM | POA: Insufficient documentation

## 2023-02-05 DIAGNOSIS — Z800 Family history of malignant neoplasm of digestive organs: Secondary | ICD-10-CM | POA: Diagnosis not present

## 2023-02-05 DIAGNOSIS — C773 Secondary and unspecified malignant neoplasm of axilla and upper limb lymph nodes: Secondary | ICD-10-CM | POA: Insufficient documentation

## 2023-02-05 DIAGNOSIS — Z170 Estrogen receptor positive status [ER+]: Secondary | ICD-10-CM | POA: Insufficient documentation

## 2023-02-05 DIAGNOSIS — Z1509 Genetic susceptibility to other malignant neoplasm: Secondary | ICD-10-CM | POA: Insufficient documentation

## 2023-02-05 DIAGNOSIS — Z7985 Long-term (current) use of injectable non-insulin antidiabetic drugs: Secondary | ICD-10-CM | POA: Insufficient documentation

## 2023-02-05 DIAGNOSIS — Z1504 Genetic susceptibility to malignant neoplasm of endometrium: Secondary | ICD-10-CM | POA: Diagnosis not present

## 2023-02-05 DIAGNOSIS — Z01812 Encounter for preprocedural laboratory examination: Secondary | ICD-10-CM | POA: Diagnosis not present

## 2023-02-05 DIAGNOSIS — Z5111 Encounter for antineoplastic chemotherapy: Secondary | ICD-10-CM | POA: Diagnosis present

## 2023-02-05 DIAGNOSIS — M79621 Pain in right upper arm: Secondary | ICD-10-CM | POA: Diagnosis not present

## 2023-02-05 MED ORDER — PREGABALIN 200 MG PO CAPS
200.0000 mg | ORAL_CAPSULE | Freq: Two times a day (BID) | ORAL | 3 refills | Status: DC
  Filled 2023-02-05 – 2023-02-16 (×2): qty 60, 30d supply, fill #0

## 2023-02-05 MED ORDER — GOSERELIN ACETATE 3.6 MG ~~LOC~~ IMPL
3.6000 mg | DRUG_IMPLANT | Freq: Once | SUBCUTANEOUS | Status: AC
  Administered 2023-02-05: 3.6 mg via SUBCUTANEOUS
  Filled 2023-02-05: qty 3.6

## 2023-02-05 NOTE — Progress Notes (Signed)
Subjective:    Patient ID: Melody Rodriguez, female    DOB: Feb 04, 1976, 47 y.o.   MRN: [id number]  HPI  HPI 42/61 47 year old female with past medical history of diabetes mellitus, breast cancer status post lumpectomy with radioactive seeds 02/07/2021, breast biopsy 02/18/2021 and node dissection 03/28/2021.  Patient reports she began having pain in her right upper extremity after her surgeries for treatment of breast cancer.  She developed pain in her shoulder and the pain spread to her entire arm.  Her arm is very sensitive to touch and has increased swelling.  She reports it has appeared red and swollen at times.  She has difficulty flexing or abducting her shoulder.  She keeps the arm by her side with elbow flexed and wrist flexed.  She had worked with PT although she had to stop it due to pain.  She reports the function in her right arm improved after the physical therapy.  She also has pain when she looks to the right with her head.  She cannot sleep on her right side and uses pillows to avoid sensation to her right arm.  Mobic does not provide significant benefit, 2x shoulder injections did not help the pain.  She reports her mood is decreased due to the pain.   Visit 06/19/22 [name] is here for follow-up of her right upper extremity pain.  She reports pain is roughly similar to her last visit.  She is able to reach her arm a little bit higher to get things on shelves at home.  She still cannot lift above her shoulder level on her right.  She continues to have burning, tingling and stabbing pains in her right upper back, shoulder, upper arm, elbow and sometimes hand and wrist.  She is trying to use her arm for more activities at home however says this is difficult.  She is not interested in physical therapy at this time.  Patient reports she is taking gabapentin 100 mg 3 times a day.  She says this is causing her some mild sedation.     Interval History 10/03/22 [name] is here for follow-up of  her chronic pain in her right upper extremity.  She continues to have pain in her arm, shoulder and around lats on her right side.  She reports Lyrica did improve the pain a little bit.  She is not having any significant side effects with the medication.  She continues to have difficulty using her upper extremity especially for activities where her shoulder is abducted.  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  She has a visit with oncology to discuss treatment options for this cancer.   Interval History 12/04/22 [name] is here for her chronic pain follow-up.  She reports pain is largely unchanged from last visit.  She is not having any side effects with the Lyrica.  She continues to have pain and weakness in her shoulder.  She is not interested in injections at this time.  She would not like to retry physical therapy.  She also does not want to try any new medications, although prior medication fills have been about $4, she says multiple medications would be too costly for her at this time.  She also feels like she has tried enough medications for the time being.  We discussed trying a higher dose of Lyrica, she says she will contact me when she is out of her current medication and consider an  increased dose.  She reports decreased mood however no SI or HI.  Interval History 02/05/23 Pain is largely unchanged from last visit.  She continues to have pain throughout her right upper extremity.  We discussed trying a higher dose of Lyrica as this does provide some benefit, patient is in agreement.  Does not have any significant side effects with the medication currently.  She is currently using CBD products for her pain.  We discussed considering tramadol, she would like to hold off at this time.  She also acknowledges that CBD products may possibly contain THC which would not be distinguishable on UDS from marijuana use.  Pain Inventory Average Pain 7 Pain Right Now 8 My  pain is constant, sharp, burning, dull, stabbing, tingling, and aching  In the last 24 hours, has pain interfered with the following? General activity 8 Relation with others 8 Enjoyment of life 10 What TIME of day is your pain at its worst? morning , daytime, evening, and night Sleep (in general) Poor  Pain is worse with: walking, bending, sitting, inactivity, standing, and some activites Pain improves with: medication Relief from Meds: 3  Family History  Problem Relation Age of Onset   Hypertension Mother    Colon cancer Sister 6   Cancer Maternal Aunt        unknown type dx late 66s   Diabetes Maternal Grandmother    Breast cancer Neg Hx    Ovarian cancer Neg Hx    Endometrial cancer Neg Hx    Pancreatic cancer Neg Hx    Prostate cancer Neg Hx    Esophageal cancer Neg Hx    Stomach cancer Neg Hx    Social History   Socioeconomic History   Marital status: Single    Spouse name: Not on file   Number of children: 2   Years of education: Not on file   Highest education level: Some college, no degree  Occupational History   Occupation: unemployed  Tobacco Use   Smoking status: Former    Packs/day: 1.00    Years: 22.00    Additional pack years: 0.00    Total pack years: 22.00    Types: Cigars, Cigarettes   Smokeless tobacco: Never   Tobacco comments:    quit 07/2020  Vaping Use   Vaping Use: Never used  Substance and Sexual Activity   Alcohol use: Yes    Comment: socially   Drug use: Yes    Types: Marijuana    Comment: daily   Sexual activity: Not Currently    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  Other Topics Concern   Not on file  Social History Narrative   Not on file   Social Determinants of Health   Financial Resource Strain: High Risk (08/29/2021)   Overall Financial Resource Strain (CARDIA)    Difficulty of Paying Living Expenses: Hard  Food Insecurity: Food Insecurity Present (08/29/2021)   Hunger Vital Sign    Worried About Running  Out of Food in the Last Year: Sometimes true    Ran Out of Food in the Last Year: Sometimes true  Transportation Needs: No Transportation Needs (08/29/2021)   PRAPARE - Administrator, Civil Service (Medical): No    Lack of Transportation (Non-Medical): No  Physical Activity: Inactive (08/29/2021)   Exercise Vital Sign    Days of Exercise per Week: 0 days    Minutes of Exercise per Session: 0 min  Stress: Stress Concern Present (08/29/2021)  [employer] of Occupational Health - Occupational Stress Questionnaire    Feeling of Stress : To some extent  Social Connections: Socially Isolated (08/29/2021)   Social Connection and Isolation Panel [NHANES]    Frequency of Communication with Friends and Family: Three times a week    Frequency of Social Gatherings with Friends and Family: More than three times a week    Attends Religious Services: Never    Database administrator or Organizations: No    Attends Banker Meetings: Never    Marital Status: Never married   Past Surgical History:  Procedure Laterality Date   BREAST BIOPSY Right 02/18/2021   Procedure: EVACUATION HEMATOMA RIGHT AXILLA;  Surgeon: Abigail Miyamoto, MD;  Location: [facility];  Service: General;  Laterality: Right;   BREAST CYST EXCISION Right    Patient does not recall (2014 or 2015)   BREAST LUMPECTOMY WITH RADIOACTIVE SEED AND SENTINEL LYMPH NODE BIOPSY Right 02/07/2021   Procedure: RIGHT BREAST LUMPECTOMY WITH RADIOACTIVE SEED AND SEED TARGETED LYMPH NODE BIOPSY AND SENTINEL LYMPH NODE BIOPSY;  Surgeon: Abigail Miyamoto, MD;  Location: [facility];  Service: General;  Laterality: Right;   CESAREAN SECTION     x2   COLONOSCOPY     IR IMAGING GUIDED PORT INSERTION  09/15/2020   IR REMOVAL TUN CV CATH W/O FL  09/15/2020   NODE DISSECTION Right 03/28/2021   Procedure: RIGHT AXILLARY LYMPH NODE DISSECTION;  Surgeon: Abigail Miyamoto, MD;  Location: [facility];  Service: General;  Laterality: Right;   PORT-A-CATH REMOVAL Left 02/07/2021   Procedure: REMOVAL PORT-A-CATH;  Surgeon: Abigail Miyamoto, MD;  Location: [facility];  Service: General;  Laterality: Left;   THYROIDECTOMY, PARTIAL     UPPER GASTROINTESTINAL ENDOSCOPY     Past Surgical History:  Procedure Laterality Date   BREAST BIOPSY Right 02/18/2021   Procedure: EVACUATION HEMATOMA RIGHT AXILLA;  Surgeon: Abigail Miyamoto, MD;  Location: [facility];  Service: General;  Laterality: Right;   BREAST CYST EXCISION Right    Patient does not recall (2014 or 2015)   BREAST LUMPECTOMY WITH RADIOACTIVE SEED AND SENTINEL LYMPH NODE BIOPSY Right 02/07/2021   Procedure: RIGHT BREAST LUMPECTOMY WITH RADIOACTIVE SEED AND SEED TARGETED LYMPH NODE BIOPSY AND SENTINEL LYMPH NODE BIOPSY;  Surgeon: Abigail Miyamoto, MD;  Location: [facility];  Service: General;  Laterality: Right;   CESAREAN SECTION     x2   COLONOSCOPY     IR IMAGING GUIDED PORT INSERTION  09/15/2020   IR REMOVAL TUN CV CATH W/O FL  09/15/2020   NODE DISSECTION Right 03/28/2021   Procedure: RIGHT AXILLARY LYMPH NODE DISSECTION;  Surgeon: Abigail Miyamoto, MD;  Location: [facility];  Service: General;  Laterality: Right;   PORT-A-CATH REMOVAL Left 02/07/2021   Procedure: REMOVAL PORT-A-CATH;  Surgeon: Abigail Miyamoto, MD;  Location: [facility];  Service: General;  Laterality: Left;   THYROIDECTOMY, PARTIAL     UPPER GASTROINTESTINAL ENDOSCOPY     Past Medical History:  Diagnosis Date   Allergy    Anemia    Anxiety    Breast cancer (HCC)    right breast cancer   Depression    Diabetes mellitus without complication (HCC)    glipizide and actps; cbgs 200s fasting   Family history of colon cancer    GERD (gastroesophageal reflux disease)    Headache    History of goiter  History of radiation therapy    right breast/scv   05/10/21-07/01/21  Dr Antony Blackbird   Hyperlipidemia    Hypertension    Monoallelic mutation of CTNNA3 gene    Recurrent major depressive disorder (HCC)    BP 112/79   Pulse 84   Ht 5\' 3"  (1.6 m)   Wt 178 lb (80.7 kg)   SpO2 96%   BMI 31.53 kg/m   Opioid Risk Score:   Fall Risk Score:  `1  Depression screen [facility] 2/9     02/05/2023    9:28 AM 10/03/2022    9:35 AM 07/31/2022   10:36 AM 06/19/2022   10:46 AM 05/23/2022    9:38 AM 04/13/2022   10:11 AM 03/14/2022    9:50 AM  Depression screen PHQ 2/9  Decreased Interest 0 3 3 3  0 1 3  Down, Depressed, Hopeless 0 3 3 3  0 1 3  PHQ - 2 Score 0 6 6 6  0 2 6  Altered sleeping       3  Tired, decreased energy       3  Change in appetite       3  Feeling bad or failure about yourself        3  Trouble concentrating       3  Moving slowly or fidgety/restless       3  Suicidal thoughts       1  PHQ-9 Score       25  Difficult doing work/chores       Very difficult      Review of Systems  Musculoskeletal:  Positive for back pain and neck pain.       Right shoulder and arm pain   All other systems reviewed and are negative.     Objective:   Physical Exam    Gen: no distress, normal appearing HEENT: oral mucosa pink and moist, NCAT Chest: normal effort, normal rate of breathing Abd: soft, non-distended Ext: no edema Psych: appropriate, normal affect   Skin: intact Neuro: Alert and oriented, sensation intact in all 4 extremities, DTR normal and symmetric Musculoskeletal:  Strength 5/5 in LUE and B/L LE Strength at least 4/5 RUE limited by pain Limited active range of motion R shoulder- unchanged Pain to palpation throughout RUE  Mild swelling in right upper extremity compared to left upper extremity Spurlings negative     MRI shoulder R IMPRESSION: 1. Mild distal infraspinatus tendinosis. No evidence of rotator cuff tear. No evidence of labral tear. 2. Faint intramuscular edema within teres minor, consistent  with low-grade muscle strain. 3. Mild soft tissue edema within the right axilla compatible with history of prior axillary lymph node dissection.       Assessment & Plan:  Right upper extremity pain. Suspicious for CPRS versus neuropathic pain related to cancer treatment, diagnosis complicated by edema due to lymph node dissection. Suspect she also has component of frozen shoulder although this by itself wouldn't explain the entirely of her diffuse pain throughout her RUE.  Prior MRI shows infraspinatus tendinitis -Duloxetine 30 mg stopped previously due to rash -Gabapentin caused sedation in the past she has stopped using amitriptyline as well -Continue meloxicam -Continue home exercise program, She declines restart formal PT -Triple phase bone scan was negative -Increase Lyrica to 200 mg twice daily -Discussed option to try medication like venlafaxine, patient declines any new medications again today -Discussed option for sympathetic nerve block patient declines again today -Discussed option for  TENS unit, she is not interested at this time -Pt currently uses CBD product - discussed this could have [employer] in the product -Discussed tramadol- pt not interested in medication change at this time   Mood disorder, Sleep disorder -Denies SI or HI -She had a rash with duloxetine but venlafaxine could be an option.  She is not interested in this medication

## 2023-02-06 ENCOUNTER — Encounter: Payer: Self-pay | Admitting: Gynecologic Oncology

## 2023-02-07 NOTE — Patient Instructions (Signed)
DUE TO COVID-19 ONLY TWO VISITORS  (aged 46 and older)  ARE ALLOWED TO COME WITH YOU AND STAY IN THE WAITING ROOM ONLY DURING PRE OP AND PROCEDURE.   **NO VISITORS ARE ALLOWED IN THE SHORT STAY AREA OR RECOVERY ROOM!!**  IF YOU WILL BE ADMITTED INTO THE HOSPITAL YOU ARE ALLOWED ONLY FOUR SUPPORT PEOPLE DURING VISITATION HOURS ONLY (7 AM -8PM)   The support person(s) must pass our screening, gel in and out, and wear a mask at all times, including in the patient's room. Patients must also wear a mask when staff or their support person are in the room. Visitors GUEST BADGE MUST BE WORN VISIBLY  One adult visitor may remain with you overnight and MUST be in the room by 8 P.M.     Your procedure is scheduled on: 02/22/23   Report to [facility] Main Entrance    Report to admitting at: 8:15 AM   Call this number if you have problems the morning of surgery [phone]   Eat a light diet the day before surgery.  Examples including soups, broths, toast, yogurt, mashed potatoes.  Things to avoid include carbonated beverages (fizzy beverages), raw fruits and raw vegetables, or beans.   If your bowels are filled with gas, your surgeon will have difficulty visualizing your pelvic organs which increases your surgical risks. Do not eat food :After Midnight.   After Midnight you may have the following liquids until: 7:30 AM DAY OF SURGERY  Water Black Coffee (sugar ok, NO MILK/CREAM OR CREAMERS)  Tea (sugar ok, NO MILK/CREAM OR CREAMERS) regular and decaf                             Plain Jell-O (NO RED)                                           Fruit ices (not with fruit pulp, NO RED)                                     Popsicles (NO RED)                                                                  Juice: apple, WHITE grape, WHITE cranberry Sports drinks like Gatorade (NO RED)              Oral Hygiene is also important to reduce your risk of infection.                                     Remember - BRUSH YOUR TEETH THE MORNING OF SURGERY WITH YOUR REGULAR TOOTHPASTE  DENTURES WILL BE REMOVED PRIOR TO SURGERY PLEASE DO NOT APPLY "Poly grip" OR ADHESIVES!!!   Do NOT smoke after Midnight   Take these medicines the morning of surgery with A SIP OF WATER: lyrica,escitalopram,anastrozole,omeprazole.Fexofenadine,loratadine,tylenol,hydroxyzine as needed.  How to Manage Your Diabetes Before and After Surgery  Why is  it important to control my blood sugar before and after surgery? Improving blood sugar levels before and after surgery helps healing and can limit problems. A way of improving blood sugar control is eating a healthy diet by:  Eating less sugar and carbohydrates  Increasing activity/exercise  Talking with your doctor about reaching your blood sugar goals High blood sugars (greater than 180 mg/dL) can raise your risk of infections and slow your recovery, so you will need to focus on controlling your diabetes during the weeks before surgery. Make sure that the doctor who takes care of your diabetes knows about your planned surgery including the date and location.  How do I manage my blood sugar before surgery? Check your blood sugar at least 4 times a day, starting 2 days before surgery, to make sure that the level is not too high or low. Check your blood sugar the morning of your surgery when you wake up and every 2 hours until you get to the Short Stay unit. If your blood sugar is less than 70 mg/dL, you will need to treat for low blood sugar: Do not take insulin. Treat a low blood sugar (less than 70 mg/dL) with  cup of clear juice (cranberry or apple), 4 glucose tablets, OR glucose gel. Recheck blood sugar in 15 minutes after treatment (to make sure it is greater than 70 mg/dL). If your blood sugar is not greater than 70 mg/dL on recheck, call [phone] for further instructions. Report your blood sugar to the short stay nurse when you get to Short Stay.  If  you are admitted to the hospital after surgery: Your blood sugar will be checked by the staff and you will probably be given insulin after surgery (instead of oral diabetes medicines) to make sure you have good blood sugar levels. The goal for blood sugar control after surgery is 80-180 mg/dL.   WHAT DO I DO ABOUT MY DIABETES MEDICATION?     THE MORNING OF SURGERY,DO NOT TAKE ANY ORAL DIABETIC MEDICATIONS DAY OF YOUR SURGERY  DO NOT TAKE THE FOLLOWING 7 DAYS PRIOR TO SURGERY: Ozempic, Wegovy, Rybelsus (Semaglutide), Byetta (exenatide), Bydureon (exenatide ER), Victoza, Saxenda (liraglutide), or Trulicity (dulaglutide) Mounjaro (Tirzepatide) Adlyxin (Lixisenatide), Polyethylene Glycol Loxenatide. Last dose 02/14/23                              You may not have any metal on your body including hair pins, jewelry, and body piercing             Do not wear make-up, lotions, powders, perfumes/cologne, or deodorant  Do not wear nail polish including gel and S&S, artificial/acrylic nails, or any other type of covering on natural nails including finger and toenails. If you have artificial nails, gel coating, etc. that needs to be removed by a nail salon please have this removed prior to surgery or surgery may need to be canceled/ delayed if the surgeon/ anesthesia feels like they are unable to be safely monitored.   Do not shave  48 hours prior to surgery.   Do not bring valuables to the hospital. [facility] IS NOT             RESPONSIBLE   FOR VALUABLES.   Contacts, glasses, or bridgework may not be worn into surgery.   Bring small overnight bag day of surgery.   DO NOT BRING YOUR HOME MEDICATIONS TO THE HOSPITAL. PHARMACY WILL DISPENSE MEDICATIONS LISTED  ON YOUR MEDICATION LIST TO YOU DURING YOUR ADMISSION IN THE HOSPITAL!    Patients discharged on the day of surgery will not be allowed to drive home.  Someone NEEDS to stay with you for the first 24 hours after anesthesia.   Special  Instructions: Bring a copy of your healthcare power of attorney and living will documents         the day of surgery if you haven't scanned them before.              Please read over the following fact sheets you were given: IF YOU HAVE QUESTIONS ABOUT YOUR PRE-OP INSTRUCTIONS PLEASE CALL [phone]    [facility] - Preparing for Surgery Before surgery, you can play an important role.  Because skin is not sterile, your skin needs to be as free of germs as possible.  You can reduce the number of germs on your skin by washing with CHG (chlorahexidine gluconate) soap before surgery.  CHG is an antiseptic cleaner which kills germs and bonds with the skin to continue killing germs even after washing. Please DO NOT use if you have an allergy to CHG or antibacterial soaps.  If your skin becomes reddened/irritated stop using the CHG and inform your nurse when you arrive at Short Stay. Do not shave (including legs and underarms) for at least 48 hours prior to the first CHG shower.  You may shave your face/neck. Please follow these instructions carefully:  1.  Shower with CHG Soap the night before surgery and the  morning of Surgery.  2.  If you choose to wash your hair, wash your hair first as usual with your  normal  shampoo.  3.  After you shampoo, rinse your hair and body thoroughly to remove the  shampoo.                           4.  Use CHG as you would any other liquid soap.  You can apply chg directly  to the skin and wash                       Gently with a scrungie or clean washcloth.  5.  Apply the CHG Soap to your body ONLY FROM THE NECK DOWN.   Do not use on face/ open                           Wound or open sores. Avoid contact with eyes, ears mouth and genitals (private parts).                       Wash face,  Genitals (private parts) with your normal soap.             6.  Wash thoroughly, paying special attention to the area where your surgery  will be performed.  7.  Thoroughly rinse your  body with warm water from the neck down.  8.  DO NOT shower/wash with your normal soap after using and rinsing off  the CHG Soap.                9.  Pat yourself dry with a clean towel.            10.  Wear clean pajamas.            11.  Place clean sheets on  your bed the night of your first shower and do not  sleep with pets. Day of Surgery : Do not apply any lotions/deodorants the morning of surgery.  Please wear clean clothes to the hospital/surgery center.  FAILURE TO FOLLOW THESE INSTRUCTIONS MAY RESULT IN THE CANCELLATION OF YOUR SURGERY PATIENT SIGNATURE_________________________________  NURSE SIGNATURE__________________________________  ________________________________________________________________________ WHAT IS A BLOOD TRANSFUSION? Blood Transfusion Information  A transfusion is the replacement of blood or some of its parts. Blood is made up of multiple cells which provide different functions. Red blood cells carry oxygen and are used for blood loss replacement. White blood cells fight against infection. Platelets control bleeding. Plasma helps clot blood. Other blood products are available for specialized needs, such as hemophilia or other clotting disorders. BEFORE THE TRANSFUSION  Who gives blood for transfusions?  Healthy volunteers who are fully evaluated to make sure their blood is safe. This is blood bank blood. Transfusion therapy is the safest it has ever been in the practice of medicine. Before blood is taken from a donor, a complete history is taken to make sure that person has no history of diseases nor engages in risky social behavior (examples are intravenous drug use or sexual activity with multiple partners). The donor's travel history is screened to minimize risk of transmitting infections, such as malaria. The donated blood is tested for signs of infectious diseases, such as HIV and hepatitis. The blood is then tested to be sure it is compatible with you in  order to minimize the chance of a transfusion reaction. If you or a relative donates blood, this is often done in anticipation of surgery and is not appropriate for emergency situations. It takes many days to process the donated blood. RISKS AND COMPLICATIONS Although transfusion therapy is very safe and saves many lives, the main dangers of transfusion include:  Getting an infectious disease. Developing a transfusion reaction. This is an allergic reaction to something in the blood you were given. Every precaution is taken to prevent this. The decision to have a blood transfusion has been considered carefully by your caregiver before blood is given. Blood is not given unless the benefits outweigh the risks. AFTER THE TRANSFUSION Right after receiving a blood transfusion, you will usually feel much better and more energetic. This is especially true if your red blood cells have gotten low (anemic). The transfusion raises the level of the red blood cells which carry oxygen, and this usually causes an energy increase. The nurse administering the transfusion will monitor you carefully for complications. HOME CARE INSTRUCTIONS  No special instructions are needed after a transfusion. You may find your energy is better. Speak with your caregiver about any limitations on activity for underlying diseases you may have. SEEK MEDICAL CARE IF:  Your condition is not improving after your transfusion. You develop redness or irritation at the intravenous (IV) site. SEEK IMMEDIATE MEDICAL CARE IF:  Any of the following symptoms occur over the next 12 hours: Shaking chills. You have a temperature by mouth above 102 F (38.9 C), not controlled by medicine. Chest, back, or muscle pain. People around you feel you are not acting correctly or are confused. Shortness of breath or difficulty breathing. Dizziness and fainting. You get a rash or develop hives. You have a decrease in urine output. Your urine turns a  dark color or changes to pink, red, or brown. Any of the following symptoms occur over the next 10 days: You have a temperature by mouth above 102 F (38.9  C), not controlled by medicine. Shortness of breath. Weakness after normal activity. The white part of the eye turns yellow (jaundice). You have a decrease in the amount of urine or are urinating less often. Your urine turns a dark color or changes to pink, red, or brown. Document Released: 09/01/2000 Document Revised: 11/27/2011 Document Reviewed: 04/20/2008 [facility] Patient Information 2014 [place], [place].  _______________________________________________________________________

## 2023-02-08 ENCOUNTER — Encounter (HOSPITAL_COMMUNITY)
Admission: RE | Admit: 2023-02-08 | Discharge: 2023-02-08 | Disposition: A | Discharge status: Home / Self Care | Payer: Medicaid Other | Source: Ambulatory Visit | Attending: Gynecologic Oncology | Admitting: Gynecologic Oncology

## 2023-02-08 ENCOUNTER — Encounter (HOSPITAL_COMMUNITY): Payer: Self-pay

## 2023-02-08 ENCOUNTER — Inpatient Hospital Stay (HOSPITAL_BASED_OUTPATIENT_CLINIC_OR_DEPARTMENT_OTHER): Payer: Medicaid Other | Admitting: Gynecologic Oncology

## 2023-02-08 ENCOUNTER — Other Ambulatory Visit: Payer: Self-pay

## 2023-02-08 ENCOUNTER — Encounter: Payer: Self-pay | Admitting: Gynecologic Oncology

## 2023-02-08 ENCOUNTER — Other Ambulatory Visit (HOSPITAL_COMMUNITY): Payer: Self-pay

## 2023-02-08 VITALS — BP 125/76 | HR 79 | Temp 98.5°F | Resp 20 | Ht 64.96 in | Wt 178.4 lb

## 2023-02-08 VITALS — BP 132/90 | HR 74 | Temp 98.2°F | Ht 64.0 in | Wt 178.4 lb

## 2023-02-08 DIAGNOSIS — Z170 Estrogen receptor positive status [ER+]: Secondary | ICD-10-CM

## 2023-02-08 DIAGNOSIS — C773 Secondary and unspecified malignant neoplasm of axilla and upper limb lymph nodes: Secondary | ICD-10-CM

## 2023-02-08 DIAGNOSIS — Z1509 Genetic susceptibility to other malignant neoplasm: Secondary | ICD-10-CM

## 2023-02-08 DIAGNOSIS — Z923 Personal history of irradiation: Secondary | ICD-10-CM

## 2023-02-08 DIAGNOSIS — Z1502 Genetic susceptibility to malignant neoplasm of ovary: Secondary | ICD-10-CM

## 2023-02-08 DIAGNOSIS — C50411 Malignant neoplasm of upper-outer quadrant of right female breast: Secondary | ICD-10-CM | POA: Insufficient documentation

## 2023-02-08 DIAGNOSIS — Z5111 Encounter for antineoplastic chemotherapy: Secondary | ICD-10-CM | POA: Diagnosis not present

## 2023-02-08 DIAGNOSIS — E119 Type 2 diabetes mellitus without complications: Secondary | ICD-10-CM | POA: Insufficient documentation

## 2023-02-08 DIAGNOSIS — E2839 Other primary ovarian failure: Secondary | ICD-10-CM

## 2023-02-08 DIAGNOSIS — Z7985 Long-term (current) use of injectable non-insulin antidiabetic drugs: Secondary | ICD-10-CM

## 2023-02-08 DIAGNOSIS — Z1501 Genetic susceptibility to malignant neoplasm of breast: Secondary | ICD-10-CM

## 2023-02-08 DIAGNOSIS — Z79811 Long term (current) use of aromatase inhibitors: Secondary | ICD-10-CM | POA: Diagnosis not present

## 2023-02-08 DIAGNOSIS — Z87891 Personal history of nicotine dependence: Secondary | ICD-10-CM

## 2023-02-08 DIAGNOSIS — Z01812 Encounter for preprocedural laboratory examination: Secondary | ICD-10-CM | POA: Insufficient documentation

## 2023-02-08 DIAGNOSIS — Z9221 Personal history of antineoplastic chemotherapy: Secondary | ICD-10-CM

## 2023-02-08 HISTORY — DX: Unspecified osteoarthritis, unspecified site: M19.90

## 2023-02-08 LAB — COMPREHENSIVE METABOLIC PANEL
ALT: 18 U/L (ref 0–44)
AST: 18 U/L (ref 15–41)
Albumin: 4.3 g/dL (ref 3.5–5.0)
Alkaline Phosphatase: 58 U/L (ref 38–126)
Anion gap: 10 (ref 5–15)
BUN: 13 mg/dL (ref 6–20)
CO2: 23 mmol/L (ref 22–32)
Calcium: 9.1 mg/dL (ref 8.9–10.3)
Chloride: 108 mmol/L (ref 98–111)
Creatinine, Ser: 0.64 mg/dL (ref 0.44–1.00)
GFR, Estimated: >60 mL/min (ref >60)
Glucose, Bld: 96 mg/dL (ref 70–99)
Potassium: 4.3 mmol/L (ref 3.5–5.1)
Sodium: 141 mmol/L (ref 135–145)
Total Bilirubin: 0.7 mg/dL (ref 0.3–1.2)
Total Protein: 7.5 g/dL (ref 6.5–8.1)

## 2023-02-08 LAB — CBC WITH DIFFERENTIAL/PLATELET
Abs Immature Granulocytes: 0.02 10*3/uL (ref 0.00–0.07)
Basophils Absolute: 0 10*3/uL (ref 0.0–0.1)
Basophils Relative: 1 %
Eosinophils Absolute: 0.2 10*3/uL (ref 0.0–0.5)
Eosinophils Relative: 2 %
HCT: 41.5 % (ref 36.0–46.0)
Hemoglobin: 13.8 g/dL (ref 12.0–15.0)
Immature Granulocytes: 0 %
Lymphocytes Relative: 24 %
Lymphs Abs: 2 10*3/uL (ref 0.7–4.0)
MCH: 30.8 pg (ref 26.0–34.0)
MCHC: 33.3 g/dL (ref 30.0–36.0)
MCV: 92.6 fL (ref 80.0–100.0)
Monocytes Absolute: 0.4 10*3/uL (ref 0.1–1.0)
Monocytes Relative: 4 %
Neutro Abs: 5.7 10*3/uL (ref 1.7–7.7)
Neutrophils Relative %: 69 %
Platelets: 231 10*3/uL (ref 150–400)
RBC: 4.48 MIL/uL (ref 3.87–5.11)
RDW: 13.5 % (ref 11.5–15.5)
WBC: 8.2 10*3/uL (ref 4.0–10.5)
nRBC: 0 % (ref 0.0–0.2)

## 2023-02-08 LAB — TYPE AND SCREEN
ABO/RH(D): O POS
Antibody Screen: NEGATIVE

## 2023-02-08 LAB — GLUCOSE, CAPILLARY: Glucose-Capillary: 88 mg/dL (ref 70–99)

## 2023-02-08 MED ORDER — SENNOSIDES-DOCUSATE SODIUM 8.6-50 MG PO TABS
2.0000 | ORAL_TABLET | Freq: Every day | ORAL | 5 refills | Status: DC
Start: 2023-02-08 — End: 2023-06-08
  Filled 2023-02-08: qty 30, 15d supply, fill #0

## 2023-02-08 MED ORDER — OXYCODONE HCL 5 MG PO TABS
5.0000 mg | ORAL_TABLET | ORAL | 0 refills | Status: DC | PRN
Start: 2023-02-08 — End: 2023-06-08
  Filled 2023-02-08 – 2023-02-16 (×2): qty 15, 3d supply, fill #0

## 2023-02-08 NOTE — Progress Notes (Addendum)
For Short Stay: COVID SWAB appointment date:  Bowel Prep reminder:   For Anesthesia: PCP - Warnell Forester: PA-C Cardiologist -   Chest x-ray -  EKG - 04/17/22 Stress Test -  ECHO - 08/27/20 Cardiac Cath -  Pacemaker/ICD device last checked: Pacemaker orders received: Device Rep notified:  Spinal Cord Stimulator: N/A  Sleep Study - N/A CPAP -   Fasting Blood Sugar - 100 - 120's Checks Blood Sugar __2-3___ times a week Date and result of last Hgb A1c-9.5: 04/17/22  Last dose of GLP1 agonist- semaglutide GLP1 instructions: To hold it after 02/09/23 dose.  Last dose of SGLT-2 inhibitors- N/A SGLT-2 instructions:   Blood Thinner Instructions: N/A Aspirin Instructions: Last Dose:  Activity level: Can go up a flight of stairs and activities of daily living without stopping and without chest pain and/or shortness of breath   Able to exercise without chest pain and/or shortness of breath  Anesthesia review: Hx: DIA,HTN.  Patient denies shortness of breath, fever, cough and chest pain at PAT appointment   Patient verbalized understanding of instructions that were given to them at the PAT appointment. Patient was also instructed that they will need to review over the PAT instructions again at home before surgery.

## 2023-02-08 NOTE — Patient Instructions (Signed)
Preparing for your Surgery  Plan for surgery on February 22, 2023 with Dr. Katherine Tucker at [facility]. You will be scheduled for robotic assisted laparoscopic bilateral salpingo-oophorectomy (removal of both ovaries and fallopian tubes).  We will check your hemoglobin A1C at your preop appointment today at the hospital.  Hold taking ozempic one week before surgery. Hold taking arimidex one week before and one week after surgery.  Pre-operative Testing -You will receive a phone call from presurgical testing at [facility] to arrange for a pre-operative appointment and lab work.  -Bring your insurance card, copy of an advanced directive if applicable, medication list  -At that visit, you will be asked to sign a consent for a possible blood transfusion in case a transfusion becomes necessary during surgery.  The need for a blood transfusion is rare but having consent is a necessary part of your care.     -You should not be taking blood thinners or aspirin at least ten days prior to surgery unless instructed by your surgeon.  -Do not take supplements such as fish oil (omega 3), red yeast rice, turmeric before your surgery. You want to avoid medications with aspirin in them including headache powders such as BC or Goody's), Excedrin migraine.  Day Before Surgery at Home -You will be asked to take in a light diet the day before surgery. You will be advised you can have clear liquids up until 3 hours before your surgery.    Eat a light diet the day before surgery.  Examples including soups, broths, toast, yogurt, mashed potatoes.  AVOID GAS PRODUCING FOODS AND BEVERAGES. Things to avoid include carbonated beverages (fizzy beverages, sodas), raw fruits and raw vegetables (uncooked), or beans.   If your bowels are filled with gas, your surgeon will have difficulty visualizing your pelvic organs which increases your surgical risks.  Your role in recovery Your role is to become  active as soon as directed by your doctor, while still giving yourself time to heal.  Rest when you feel tired. You will be asked to do the following in order to speed your recovery:  - Cough and breathe deeply. This helps to clear and expand your lungs and can prevent pneumonia after surgery.  - STAY ACTIVE WHEN YOU GET HOME. Do mild physical activity. Walking or moving your legs help your circulation and body functions return to normal. Do not try to get up or walk alone the first time after surgery.   -If you develop swelling on one leg or the other, pain in the back of your leg, redness/warmth in one of your legs, please call the office or go to the Emergency Room to have a doppler to rule out a blood clot. For shortness of breath, chest pain-seek care in the Emergency Room as soon as possible. - Actively manage your pain. Managing your pain lets you move in comfort. We will ask you to rate your pain on a scale of zero to 10. It is your responsibility to tell your doctor or nurse where and how much you hurt so your pain can be treated.  Special Considerations -If you are diabetic, you may be placed on insulin after surgery to have closer control over your blood sugars to promote healing and recovery.  This does not mean that you will be discharged on insulin.  If applicable, your oral antidiabetics will be resumed when you are tolerating a solid diet.  -Your final pathology results from surgery should be   available around one week after surgery and the results will be relayed to you when available.  -FMLA forms can be faxed to [phone] and please allow 5-7 business days for completion.  Pain Management After Surgery -You have been prescribed your pain medication and bowel regimen medications before surgery so that you can have these available when you are discharged from the hospital. The pain medication is for use ONLY AFTER surgery and a new prescription will not be given.   -Make sure that  you have Tylenol IF YOU ARE ABLE TO TAKE THESE MEDICATION at home to use on a regular basis after surgery for pain control.   -Review the attached handout on narcotic use and their risks and side effects.   Bowel Regimen -You have been prescribed Sennakot-S to take nightly to prevent constipation especially if you are taking the narcotic pain medication intermittently.  It is important to prevent constipation and drink adequate amounts of liquids. You can stop taking this medication when you are not taking pain medication and you are back on your normal bowel routine.  Risks of Surgery Risks of surgery are low but include bleeding, infection, damage to surrounding structures, re-operation, blood clots, and very rarely death.   Blood Transfusion Information (For the consent to be signed before surgery)  We will be checking your blood type before surgery so in case of emergencies, we will know what type of blood you would need.                                            WHAT IS A BLOOD TRANSFUSION?  A transfusion is the replacement of blood or some of its parts. Blood is made up of multiple cells which provide different functions. Red blood cells carry oxygen and are used for blood loss replacement. White blood cells fight against infection. Platelets control bleeding. Plasma helps clot blood. Other blood products are available for specialized needs, such as hemophilia or other clotting disorders. BEFORE THE TRANSFUSION  Who gives blood for transfusions?  You may be able to donate blood to be used at a later date on yourself (autologous donation). Relatives can be asked to donate blood. This is generally not any safer than if you have received blood from a stranger. The same precautions are taken to ensure safety when a relative's blood is donated. Healthy volunteers who are fully evaluated to make sure their blood is safe. This is blood bank blood. Transfusion therapy is the safest it has  ever been in the practice of medicine. Before blood is taken from a donor, a complete history is taken to make sure that person has no history of diseases nor engages in risky social behavior (examples are intravenous drug use or sexual activity with multiple partners). The donor's travel history is screened to minimize risk of transmitting infections, such as malaria. The donated blood is tested for signs of infectious diseases, such as HIV and hepatitis. The blood is then tested to be sure it is compatible with you in order to minimize the chance of a transfusion reaction. If you or a relative donates blood, this is often done in anticipation of surgery and is not appropriate for emergency situations. It takes many days to process the donated blood. RISKS AND COMPLICATIONS Although transfusion therapy is very safe and saves many lives, the main dangers of transfusion include:    Getting an infectious disease. Developing a transfusion reaction. This is an allergic reaction to something in the blood you were given. Every precaution is taken to prevent this. The decision to have a blood transfusion has been considered carefully by your caregiver before blood is given. Blood is not given unless the benefits outweigh the risks.  AFTER SURGERY INSTRUCTIONS  Return to work: 4-6 weeks if applicable  Activity: 1. Be up and out of the bed during the day.  Take a nap if needed.  You may walk up steps but be careful and use the hand rail.  Stair climbing will tire you more than you think, you may need to stop part way and rest.   2. No lifting or straining for 6 weeks over 10 pounds. No pushing, pulling, straining for 6 weeks.  3. No driving for around 1 week(s).  Do not drive if you are taking narcotic pain medicine and make sure that your reaction time has returned.   4. You can shower as soon as the next day after surgery. Shower daily.  Use your regular soap and water (not directly on the incision) and pat  your incision(s) dry afterwards; don't rub.  No tub baths or submerging your body in water until cleared by your surgeon. If you have the soap that was given to you by pre-surgical testing that was used before surgery, you do not need to use it afterwards because this can irritate your incisions.   5. No sexual activity and nothing in the vagina for 4-6 weeks.  6. You may experience a small amount of clear drainage from your incisions, which is normal.  If the drainage persists, increases, or changes color please call the office.  7. Do not use creams, lotions, or ointments such as neosporin on your incisions after surgery until advised by your surgeon because they can cause removal of the dermabond glue on your incisions.    8. You may experience vaginal spotting after surgery.  The spotting is normal but if you experience heavy bleeding, call our office.  9. Take Tylenol first for pain if you are able to take these medications and only use narcotic pain medication for severe pain not relieved by the Tylenol.  Monitor your Tylenol intake to a max of 4,000 mg in a 24 hour period.   Diet: 1. Low sodium Heart Healthy Diet is recommended but you are cleared to resume your normal (before surgery) diet after your procedure.  2. It is safe to use a laxative, such as Miralax or Colace, if you have difficulty moving your bowels. You have been prescribed Sennakot-S to take at bedtime every evening after surgery to keep bowel movements regular and to prevent constipation.    Wound Care: 1. Keep clean and dry.  Shower daily.  Reasons to call the Doctor: Fever - Oral temperature greater than 100.4 degrees Fahrenheit Foul-smelling vaginal discharge Difficulty urinating Nausea and vomiting Increased pain at the site of the incision that is unrelieved with pain medicine. Difficulty breathing with or without chest pain New calf pain especially if only on one side Sudden, continuing increased vaginal  bleeding with or without clots.   Contacts: For questions or concerns you should contact:  Dr. Katherine Tucker at [phone]  Quinnley Colasurdo, NP at [phone]  After Hours: call [phone] and have the GYN Oncologist paged/contacted (after 5 pm or on the weekends). You will speak with an after hours RN and let he or she know you have   had surgery.  Messages sent via mychart are for non-urgent matters and are not responded to after hours so for urgent needs, please call the after hours number.      

## 2023-02-08 NOTE — Patient Instructions (Addendum)
Preparing for your Surgery  Plan for surgery on February 22, 2023 with Dr. Eugene Garnet at [facility]. You will be scheduled for robotic assisted laparoscopic bilateral salpingo-oophorectomy (removal of both ovaries and fallopian tubes).  We will check your hemoglobin A1C at your preop appointment today at the hospital.  Hold taking ozempic one week before surgery. Hold taking arimidex one week before and one week after surgery.  Pre-operative Testing -You will receive a phone call from presurgical testing at [facility] to arrange for a pre-operative appointment and lab work.  -Bring your insurance card, copy of an advanced directive if applicable, medication list  -At that visit, you will be asked to sign a consent for a possible blood transfusion in case a transfusion becomes necessary during surgery.  The need for a blood transfusion is rare but having consent is a necessary part of your care.     -You should not be taking blood thinners or aspirin at least ten days prior to surgery unless instructed by your surgeon.  -Do not take supplements such as fish oil (omega 3), red yeast rice, turmeric before your surgery. You want to avoid medications with aspirin in them including headache powders such as BC or Goody's), Excedrin migraine.  Day Before Surgery at Home -You will be asked to take in a light diet the day before surgery. You will be advised you can have clear liquids up until 3 hours before your surgery.    Eat a light diet the day before surgery.  Examples including soups, broths, toast, yogurt, mashed potatoes.  AVOID GAS PRODUCING FOODS AND BEVERAGES. Things to avoid include carbonated beverages (fizzy beverages, sodas), raw fruits and raw vegetables (uncooked), or beans.   If your bowels are filled with gas, your surgeon will have difficulty visualizing your pelvic organs which increases your surgical risks.  Your role in recovery Your role is to become  active as soon as directed by your doctor, while still giving yourself time to heal.  Rest when you feel tired. You will be asked to do the following in order to speed your recovery:  - Cough and breathe deeply. This helps to clear and expand your lungs and can prevent pneumonia after surgery.  - STAY ACTIVE WHEN YOU GET HOME. Do mild physical activity. Walking or moving your legs help your circulation and body functions return to normal. Do not try to get up or walk alone the first time after surgery.   -If you develop swelling on one leg or the other, pain in the back of your leg, redness/warmth in one of your legs, please call the office or go to the Emergency Room to have a doppler to rule out a blood clot. For shortness of breath, chest pain-seek care in the Emergency Room as soon as possible. - Actively manage your pain. Managing your pain lets you move in comfort. We will ask you to rate your pain on a scale of zero to 10. It is your responsibility to tell your doctor or nurse where and how much you hurt so your pain can be treated.  Special Considerations -If you are diabetic, you may be placed on insulin after surgery to have closer control over your blood sugars to promote healing and recovery.  This does not mean that you will be discharged on insulin.  If applicable, your oral antidiabetics will be resumed when you are tolerating a solid diet.  -Your final pathology results from surgery should be  available around one week after surgery and the results will be relayed to you when available.  -FMLA forms can be faxed to [phone] and please allow 5-7 business days for completion.  Pain Management After Surgery -You have been prescribed your pain medication and bowel regimen medications before surgery so that you can have these available when you are discharged from the hospital. The pain medication is for use ONLY AFTER surgery and a new prescription will not be given.   -Make sure that  you have Tylenol IF YOU ARE ABLE TO TAKE THESE MEDICATION at home to use on a regular basis after surgery for pain control.   -Review the attached handout on narcotic use and their risks and side effects.   Bowel Regimen -You have been prescribed Sennakot-S to take nightly to prevent constipation especially if you are taking the narcotic pain medication intermittently.  It is important to prevent constipation and drink adequate amounts of liquids. You can stop taking this medication when you are not taking pain medication and you are back on your normal bowel routine.  Risks of Surgery Risks of surgery are low but include bleeding, infection, damage to surrounding structures, re-operation, blood clots, and very rarely death.   Blood Transfusion Information (For the consent to be signed before surgery)  We will be checking your blood type before surgery so in case of emergencies, we will know what type of blood you would need.                                            WHAT IS A BLOOD TRANSFUSION?  A transfusion is the replacement of blood or some of its parts. Blood is made up of multiple cells which provide different functions. Red blood cells carry oxygen and are used for blood loss replacement. White blood cells fight against infection. Platelets control bleeding. Plasma helps clot blood. Other blood products are available for specialized needs, such as hemophilia or other clotting disorders. BEFORE THE TRANSFUSION  Who gives blood for transfusions?  You may be able to donate blood to be used at a later date on yourself (autologous donation). Relatives can be asked to donate blood. This is generally not any safer than if you have received blood from a stranger. The same precautions are taken to ensure safety when a relative's blood is donated. Healthy volunteers who are fully evaluated to make sure their blood is safe. This is blood bank blood. Transfusion therapy is the safest it has  ever been in the practice of medicine. Before blood is taken from a donor, a complete history is taken to make sure that person has no history of diseases nor engages in risky social behavior (examples are intravenous drug use or sexual activity with multiple partners). The donor's travel history is screened to minimize risk of transmitting infections, such as malaria. The donated blood is tested for signs of infectious diseases, such as HIV and hepatitis. The blood is then tested to be sure it is compatible with you in order to minimize the chance of a transfusion reaction. If you or a relative donates blood, this is often done in anticipation of surgery and is not appropriate for emergency situations. It takes many days to process the donated blood. RISKS AND COMPLICATIONS Although transfusion therapy is very safe and saves many lives, the main dangers of transfusion include:  Getting an infectious disease. Developing a transfusion reaction. This is an allergic reaction to something in the blood you were given. Every precaution is taken to prevent this. The decision to have a blood transfusion has been considered carefully by your caregiver before blood is given. Blood is not given unless the benefits outweigh the risks.  AFTER SURGERY INSTRUCTIONS  Return to work: 4-6 weeks if applicable  Activity: 1. Be up and out of the bed during the day.  Take a nap if needed.  You may walk up steps but be careful and use the hand rail.  Stair climbing will tire you more than you think, you may need to stop part way and rest.   2. No lifting or straining for 6 weeks over 10 pounds. No pushing, pulling, straining for 6 weeks.  3. No driving for around 1 week(s).  Do not drive if you are taking narcotic pain medicine and make sure that your reaction time has returned.   4. You can shower as soon as the next day after surgery. Shower daily.  Use your regular soap and water (not directly on the incision) and pat  your incision(s) dry afterwards; don't rub.  No tub baths or submerging your body in water until cleared by your surgeon. If you have the soap that was given to you by pre-surgical testing that was used before surgery, you do not need to use it afterwards because this can irritate your incisions.   5. No sexual activity and nothing in the vagina for 4-6 weeks.  6. You may experience a small amount of clear drainage from your incisions, which is normal.  If the drainage persists, increases, or changes color please call the office.  7. Do not use creams, lotions, or ointments such as neosporin on your incisions after surgery until advised by your surgeon because they can cause removal of the dermabond glue on your incisions.    8. You may experience vaginal spotting after surgery.  The spotting is normal but if you experience heavy bleeding, call our office.  9. Take Tylenol first for pain if you are able to take these medications and only use narcotic pain medication for severe pain not relieved by the Tylenol.  Monitor your Tylenol intake to a max of 4,000 mg in a 24 hour period.   Diet: 1. Low sodium Heart Healthy Diet is recommended but you are cleared to resume your normal (before surgery) diet after your procedure.  2. It is safe to use a laxative, such as Miralax or Colace, if you have difficulty moving your bowels. You have been prescribed Sennakot-S to take at bedtime every evening after surgery to keep bowel movements regular and to prevent constipation.    Wound Care: 1. Keep clean and dry.  Shower daily.  Reasons to call the Doctor: Fever - Oral temperature greater than 100.4 degrees Fahrenheit Foul-smelling vaginal discharge Difficulty urinating Nausea and vomiting Increased pain at the site of the incision that is unrelieved with pain medicine. Difficulty breathing with or without chest pain New calf pain especially if only on one side Sudden, continuing increased vaginal  bleeding with or without clots.   Contacts: For questions or concerns you should contact:  Dr. Eugene Garnet at [phone]  Warner Mccreedy, NP at [phone]  After Hours: call [phone] and have the GYN Oncologist paged/contacted (after 5 pm or on the weekends). You will speak with an after hours RN and let he or she know you have  had surgery.  Messages sent via mychart are for non-urgent matters and are not responded to after hours so for urgent needs, please call the after hours number.

## 2023-02-08 NOTE — H&P (View-Only) (Signed)
Gynecologic Oncology Return Clinic Visit  02/08/23  Reason for Visit: treatment planning  Treatment History: Oncology History  Malignant neoplasm of upper-outer quadrant of right breast in female, estrogen receptor positive (HCC)  07/28/2020 Initial Diagnosis   Patient palpated a right breast mass for 1-2 years. Mammogram showed a 2.2cm mass at the 11 o'clock position with surrounding calcifications, 6.4cm in total extent, and up to 5 abnormal right axillary lymph nodes. 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    08/05/2020 Miscellaneous   MammaPrint: High risk luminal type B   08/11/2020 Genetic Testing   Negative genetic testing: no pathogenic variants detected in [facility] Breast Cancer STAT Panel or Common Hereditary Cancers panel. The report dates are August 11, 2020 and August 19, 2020, respectively. Two variants of uncertain signficance were detected - one in the CTNNA1 gene called c.86del and the second in the MLH1 gene called c.808A>G.   UPDATE:  The MLH1 c.808A>G VUS was reclassified to "Likely Benign" on 02/19/2021. The change in variant classification was made as a result of re-review of the evidence in light of new variant interpretation guidelines and/or new information.   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 The amended report date is February 16, 2022.   The STAT Breast cancer panel offered by [facility] includes sequencing and rearrangement analysis for the following 9 genes:  ATM, BRCA1, BRCA2, CDH1, CHEK2, PALB2, PTEN, STK11 and TP53.  The Common Hereditary Cancers Panel offered by [facility] includes sequencing and/or deletion duplication testing of the following 48 genes: APC, ATM, AXIN2, BARD1, BMPR1A, BRCA1, BRCA2, BRIP1, CDH1, CDK4, CDKN2A (p14ARF), CDKN2A (p16INK4a), CHEK2, CTNNA1, DICER1, EPCAM  (Deletion/duplication testing only), GREM1 (promoter region deletion/duplication testing only), KIT, MEN1, MLH1, MSH2, MSH3, MSH6, MUTYH, NBN, NF1, NTHL1, PALB2, PDGFRA, PMS2, POLD1, POLE, PTEN, RAD50, RAD51C, RAD51D, RNF43, SDHB, SDHC, SDHD, SMAD4, SMARCA4. STK11, TP53, TSC1, TSC2, and VHL.  The following genes were evaluated for sequence changes only: SDHA and HOXB13 c.251G>A variant only.    09/01/2020 - 01/11/2021 Neo-Adjuvant Chemotherapy   Adriamycin and Cytoxan x4 09/01/2020-10/12/2020 Weekly Taxol x 12  10/26/2020-01/11/2021(dose reduced d/t AE)   02/07/2021 Surgery   Right lumpectomy (Blackman): invasive and in situ ductal carcinoma, 2.8cm, clear margins, with metastatic carcinoma in 1/1 right axillary lymph nodes.   03/28/2021 Surgery   Axillary lymph node dissection: 3/7 lymph nodes +   05/10/2021 - 07/01/2021 Radiation Therapy   Site Technique Total Dose (Gy) Dose per Fx (Gy) Completed Fx Beam Energies  Breast, Right: Breast_Rt 3D 50.4/50.4 1.8 28/28 10X  Breast, Right: Breast_Rt_Bst specialPort 12/12 2 6/6 15E  Sclav-RT: SCV_Rt 3D 50.4/50.4 1.8 28/28      07/2021 -  Anti-estrogen oral therapy   Zoladex + Letrozole + Verzenio     Interval History: Overall doing well.  Continues to have hot flashes related to medical ovarian suppression.  Has lost little over 50 pounds since starting Ozempic.  Has been able to come off of her some of her diabetes medications.  Notes that all of her sugars have been well under 200.  She continues to have some pain as well as nausea associated with her Zoladex.  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  Plan is for close surveillance with EGD every   3 months.  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/18/2022   Stomach cancer   Depression    Diabetes mellitus without complication (HCC)    glipizide and actps; cbgs 200s fasting   Family history of colon cancer    GERD (gastroesophageal reflux disease)    Headache    History of goiter    History of radiation therapy    right breast/scv  05/10/21-07/01/21  Dr James Kinard   Hyperlipidemia    Hypertension    Monoallelic mutation of CTNNA3 gene    Recurrent major depressive disorder (HCC)     Past Surgical History:  Procedure Laterality Date   BREAST BIOPSY Right 02/18/2021   Procedure: EVACUATION HEMATOMA RIGHT AXILLA;  Surgeon: Blackman, Douglas, MD;  Location: [facility];  Service: General;  Laterality: Right;   BREAST CYST EXCISION Right    Patient does not recall (2014 or 2015)   BREAST LUMPECTOMY WITH RADIOACTIVE SEED AND SENTINEL LYMPH NODE BIOPSY Right 02/07/2021   Procedure: RIGHT BREAST LUMPECTOMY WITH RADIOACTIVE SEED AND SEED TARGETED LYMPH NODE BIOPSY AND SENTINEL LYMPH NODE BIOPSY;  Surgeon: Blackman, Douglas, MD;  Location: [facility];  Service: General;  Laterality: Right;   CESAREAN SECTION     x2   COLONOSCOPY     IR IMAGING GUIDED PORT INSERTION  09/15/2020   IR REMOVAL TUN CV CATH W/O FL  09/15/2020   NODE DISSECTION Right 03/28/2021   Procedure: RIGHT AXILLARY LYMPH NODE DISSECTION;  Surgeon: Blackman, Douglas, MD;  Location: [facility];  Service: General;  Laterality: Right;   PORT-A-CATH REMOVAL Left 02/07/2021   Procedure: REMOVAL PORT-A-CATH;  Surgeon: Blackman, Douglas, MD;  Location:  SURGERY CENTER;  Service: General;  Laterality: Left;   THYROIDECTOMY, PARTIAL     UPPER GASTROINTESTINAL ENDOSCOPY      Family History  Problem Relation Age of Onset   Hypertension Mother    Colon cancer Sister 36   Cancer Maternal Aunt        unknown type dx late 50s   Diabetes Maternal Grandmother    Breast cancer  Neg Hx    Ovarian cancer Neg Hx    Endometrial cancer Neg Hx    Pancreatic cancer Neg Hx    Prostate cancer Neg Hx    Esophageal cancer Neg Hx    Stomach cancer Neg Hx     Social History   Socioeconomic History   Marital status: Single    Spouse name: Not on file   Number of children: 2   Years of education: Not on file   Highest education level: Some college, no degree  Occupational History   Occupation: unemployed  Tobacco Use   Smoking status: Former    Packs/day: 1.00    Years: 22.00    Additional pack years: 0.00    Total pack years: 22.00    Types: Cigars, Cigarettes   Smokeless tobacco: Never   Tobacco comments:    quit 07/2020  Vaping Use   Vaping Use: Never used  Substance and Sexual Activity   Alcohol use: Yes    Comment: socially   Drug use: Yes    Types: Marijuana    Comment: daily   Sexual   activity: Not Currently    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  Other Topics Concern   Not on file  Social History Narrative   Not on file   Social Determinants of Health   Financial Resource Strain: High Risk (08/29/2021)   Overall Financial Resource Strain (CARDIA)    Difficulty of Paying Living Expenses: Hard  Food Insecurity: Food Insecurity Present (08/29/2021)   Hunger Vital Sign    Worried About Running Out of Food in the Last Year: Sometimes true    Ran Out of Food in the Last Year: Sometimes true  Transportation Needs: No Transportation Needs (08/29/2021)   PRAPARE - Transportation    Lack of Transportation (Medical): No    Lack of Transportation (Non-Medical): No  Physical Activity: Inactive (08/29/2021)   Exercise Vital Sign    Days of Exercise per Week: 0 days    Minutes of Exercise per Session: 0 min  Stress: Stress Concern Present (08/29/2021)   [employer] - Occupational Stress Questionnaire    Feeling of Stress : To some extent  Social Connections: Socially Isolated (08/29/2021)   Social Connection  and Isolation Panel [NHANES]    Frequency of Communication with Friends and Family: Three times a week    Frequency of Social Gatherings with Friends and Family: More than three times a week    Attends Religious Services: Never    Active Member of Clubs or Organizations: No    Attends Club or Organization Meetings: Never    Marital Status: Never married    Current Medications:  Current Outpatient Medications:    ACCU-CHEK GUIDE test strip, USE TO MONITOR BLOOD GLUCOSE 3 TIME(S) DAILY, Disp: , Rfl:    Accu-Chek Softclix Lancets lancets, 3 (three) times daily., Disp: , Rfl:    acetaminophen (TYLENOL) 500 MG tablet, Take 2,000 mg by mouth every 8 (eight) hours as needed for moderate pain., Disp: , Rfl:    anastrozole (ARIMIDEX) 1 MG tablet, Take 1 tablet (1 mg total) by mouth daily., Disp: 90 tablet, Rfl: 3   atorvastatin (LIPITOR) 10 MG tablet, Take 1 tablet (10 mg total) by mouth daily., Disp: 30 tablet, Rfl: 5   Blood Glucose Monitoring Suppl (ACCU-CHEK GUIDE ME) w/Device KIT, See admin instructions., Disp: , Rfl:    escitalopram (LEXAPRO) 10 MG tablet, Take 1 tablet (10 mg total) by mouth daily., Disp: 30 tablet, Rfl: 2   famotidine (PEPCID) 40 MG tablet, Take 1 tablet (40 mg total) by mouth at bedtime., Disp: 30 tablet, Rfl: 2   fexofenadine (ALLEGRA) 180 MG tablet, Take 180 mg by mouth daily as needed for allergies., Disp: , Rfl:    fluticasone (FLONASE) 50 MCG/ACT nasal spray, Place 1 spray into both nostrils daily as needed for allergies., Disp: , Rfl:    hydrOXYzine (ATARAX) 10 MG tablet, Take 1 tablet by mouth 3 (three) times a day as needed for anxiety., Disp: 90 tablet, Rfl: 0   lidocaine (XYLOCAINE) 2 % solution, Use 15 mLs in the mouth or throat every 3 (three) hours as needed (Throat pain)., Disp: 100 mL, Rfl: 0   loratadine (CLARITIN) 10 MG tablet, Take 10 mg by mouth daily as needed for allergies., Disp: , Rfl:    Multiple Vitamins-Minerals (MULTIVITAMIN WOMEN) TABS, Take 1  tablet by mouth daily., Disp: , Rfl:    nystatin (MYCOSTATIN/NYSTOP) powder, Apply topically 2 (two) times a day as needed., Disp: 30 g, Rfl: 1   omeprazole (PRILOSEC) 40 MG capsule, Take 1 capsule (  40 mg total) by mouth daily for 8 weeks, Disp: 30 capsule, Rfl: 1   ondansetron (ZOFRAN) 4 MG tablet, Take 1 tablet (4 mg total) by mouth every 8 (eight) hours as needed for Nausea., Disp: 30 tablet, Rfl: 2   pregabalin (LYRICA) 200 MG capsule, Take 1 capsule (200 mg) by mouth 2 times daily., Disp: 60 capsule, Rfl: 3   Semaglutide, 2 MG/DOSE, (OZEMPIC, 2 MG/DOSE,) 8 MG/3ML SOPN, Inject 2 mg into the skin once a week., Disp: 3 mL, Rfl: 3   triamcinolone cream (KENALOG) 0.1 %, Apply topically 2 (two) times a day as needed., Disp: 40 g, Rfl: 1   glipiZIDE (GLUCOTROL) 10 MG tablet, Take 1 tablet (10 mg total) by mouth 2 (two) times daily. (Patient not taking: Reported on 02/06/2023), Disp: 60 tablet, Rfl: 2  Review of Systems: + Chills, fatigue, weight loss, ringing in ears, hot flashes, back pain, itch, rash, dizziness, migraines, numbness, anxiety, depression. Denies appetite changes, fevers. Denies hearing loss, neck lumps or masses, mouth sores or voice changes. Denies cough or wheezing.  Denies shortness of breath. Denies chest pain or palpitations. Denies leg swelling. Denies abdominal distention, pain, blood in stools, constipation, diarrhea, nausea, vomiting, or early satiety. Denies pain with intercourse, dysuria, frequency, hematuria or incontinence. Denies pelvic pain, vaginal bleeding or vaginal discharge.   Denies joint pain. Denies itching, rash, or wounds. Denies dizziness or seizures. Denies swollen lymph nodes or glands, denies easy bruising or bleeding. Denies confusion, or decreased concentration.  Physical Exam: BP 125/76 (BP Location: Left Arm, Patient Position: Sitting)   Pulse 79   Temp 98.5 F (36.9 C) (Oral)   Resp 20   Ht 5' 4.96" (1.65 m)   Wt 178 lb 6.4 oz (80.9 kg)    SpO2 100%   BMI 29.72 kg/m  General: Alert, oriented, no acute distress. HEENT: Normocephalic, atraumatic, sclera anicteric. Chest: Clear to auscultation bilaterally.  No wheezes or rhonchi. Cardiovascular: Regular rate and rhythm, no murmurs.  Laboratory & Radiologic Studies: Last pap 2022 - [facility]  Assessment & Plan: Thomasenia T Canada is a 47 y.o. woman with ER+ breast cancer interested in therapeutic BSO.  The patient is overall doing well.  She continues on medical suppression for her estrogen receptor positive breast cancer and secondary to side effects related to this would like to proceed with therapeutic bilateral salpingo-oophorectomy.  This was previously scheduled and then the plan was changed to have a joint procedure with surgery for gastrectomy.  She is now going to undergo close surveillance rather than gastrectomy and is ready to proceed with therapeutic BSO.  She lost significant weight on Ozempic, notes good glycemic control.  I do not see a hemoglobin A1c since last fall.  Will plan to get this with her preoperative labs.  The patient was congratulated on her weight loss thus far.  We reviewed  the plan for a robotic assisted bilateral salpingo-oophorectomy, possible laparotomy. The risks of surgery were discussed in detail and she understands these to include infection; wound separation; hernia; injury to adjacent organs such as bowel, bladder, blood vessels, ureters and nerves; bleeding which may require blood transfusion; anesthesia risk; thromboembolic events; possible death; unforeseen complications; possible need for re-exploration; medical complications such as heart attack, stroke, pleural effusion and pneumonia. The patient will receive DVT and antibiotic prophylaxis as indicated. She voiced a clear understanding. She had the opportunity to ask questions. Perioperative instructions were reviewed with her. Prescriptions for post-op medications were sent to her pharmacy   of  choice.  28 minutes of total time was spent for this patient encounter, including preparation, face-to-face counseling with the patient and coordination of care, and documentation of the encounter.  Aolani Piggott, MD  Division of Gynecologic Oncology  Department of Obstetrics and Gynecology  [facility]   

## 2023-02-08 NOTE — Progress Notes (Signed)
Patient here for follow up with [name] and for a pre-operative appointment prior to her scheduled surgery on 02/22/2023. She is scheduled for robotic assisted BSO.  She has her pre-admission testing appointment this am at [facility].  The surgery was discussed in detail.  See after visit summary for additional details.    Discussed post-op pain management in detail including the aspects of the enhanced recovery pathway.  Advised her that a new prescription would be sent in for oxycodone and it is only to be used for after her upcoming surgery.  We discussed the use of tylenol post-op and to monitor for a maximum of 4,000 mg in a 24 hour period.  Also prescribed sennakot to be used after surgery and to hold if having loose stools.  Discussed bowel regimen in detail.     Discussed measures to take at home to prevent DVT including frequent mobility.  Reportable signs and symptoms of DVT discussed. Post-operative instructions discussed and expectations for after surgery. Incisional care discussed as well including reportable signs and symptoms including erythema, drainage, wound separation.     10 minutes spent with the patient/preparing information.  Verbalizing understanding of material discussed. No needs or concerns voiced at the end of the visit.   Advised patient to call for any needs.  Advised that her post-operative medications had been prescribed and could be picked up at any time.    This appointment is included in the global surgical bundle as pre-operative teaching and has no charge.

## 2023-02-08 NOTE — Progress Notes (Signed)
Gynecologic Oncology Return Clinic Visit  02/08/23  Reason for Visit: treatment planning  Treatment History: Oncology History  Malignant neoplasm of upper-outer quadrant of right breast in female, estrogen receptor positive (HCC)  07/28/2020 Initial Diagnosis   Patient palpated a right breast mass for 1-2 years. Mammogram showed a 2.2cm mass at the 11 o'clock position with surrounding calcifications, 6.4cm in total extent, and up to 5 abnormal right axillary lymph nodes. 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    08/05/2020 Miscellaneous   MammaPrint: High risk luminal type B   08/11/2020 Genetic Testing   Negative genetic testing: no pathogenic variants detected in [facility] Breast Cancer STAT Panel or Common Hereditary Cancers panel. The report dates are August 11, 2020 and August 19, 2020, respectively. Two variants of uncertain signficance were detected - one in the CTNNA1 gene called c.86del and the second in the MLH1 gene called c.808A>G.   UPDATE:  The MLH1 c.808A>G VUS was reclassified to "Likely Benign" on 02/19/2021. The change in variant classification was made as a result of re-review of the evidence in light of new variant interpretation guidelines and/or new information.   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 The amended report date is February 16, 2022.   The STAT Breast cancer panel offered by [facility] includes sequencing and rearrangement analysis for the following 9 genes:  ATM, BRCA1, BRCA2, CDH1, CHEK2, PALB2, PTEN, STK11 and TP53.  The Common Hereditary Cancers Panel offered by [facility] includes sequencing and/or deletion duplication testing of the following 48 genes: APC, ATM, AXIN2, BARD1, BMPR1A, BRCA1, BRCA2, BRIP1, CDH1, CDK4, CDKN2A (p14ARF), CDKN2A (p16INK4a), CHEK2, CTNNA1, DICER1, EPCAM  (Deletion/duplication testing only), GREM1 (promoter region deletion/duplication testing only), KIT, MEN1, MLH1, MSH2, MSH3, MSH6, MUTYH, NBN, NF1, NTHL1, PALB2, PDGFRA, PMS2, POLD1, POLE, PTEN, RAD50, RAD51C, RAD51D, RNF43, SDHB, SDHC, SDHD, SMAD4, SMARCA4. STK11, TP53, TSC1, TSC2, and VHL.  The following genes were evaluated for sequence changes only: SDHA and HOXB13 c.251G>A variant only.    09/01/2020 - 01/11/2021 Neo-Adjuvant Chemotherapy   Melody Rodriguez and Melody Rodriguez x4 09/01/2020-10/12/2020 Weekly Taxol x 12  10/26/2020-01/11/2021(dose reduced d/t AE)   02/07/2021 Surgery   Right lumpectomy Melody Rodriguez): invasive and in situ ductal carcinoma, 2.8cm, clear margins, with metastatic carcinoma in 1/1 right axillary lymph nodes.   03/28/2021 Surgery   Axillary lymph node dissection: 3/7 lymph nodes +   05/10/2021 - 07/01/2021 Radiation Therapy   Site Technique Total Dose (Gy) Dose per Fx (Gy) Completed Fx Beam Energies  Breast, Right: Breast_Rt 3D 50.4/50.4 1.8 28/28 10X  Breast, Right: Breast_Rt_Bst specialPort 12/12 2 6/6 15E  Sclav-RT: SCV_Rt 3D 50.4/50.4 1.8 28/28      07/2021 -  Anti-estrogen oral therapy   Melody Rodriguez + Melody Rodriguez + Melody Rodriguez     Interval History: Overall doing well.  Continues to have hot flashes related to medical ovarian suppression.  Has lost little over 50 pounds since starting Melody Rodriguez.  Has been able to come off of her some of her diabetes medications.  Notes that all of her sugars have been well under 200.  She continues to have some pain as well as nausea associated with her Melody Rodriguez.  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  Plan is for close surveillance with EGD every  3 months.  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/18/2022   Stomach cancer   Depression    Diabetes mellitus without complication (HCC)    glipizide and actps; cbgs 200s fasting   Family history of colon cancer    GERD (gastroesophageal reflux disease)    Headache    History of goiter    History of radiation therapy    right breast/scv  05/10/21-07/01/21  Dr Antony Blackbird   Hyperlipidemia    Hypertension    Monoallelic mutation of CTNNA3 gene    Recurrent major depressive disorder [facility])     Past Surgical History:  Procedure Laterality Date   BREAST BIOPSY Right 02/18/2021   Procedure: EVACUATION HEMATOMA RIGHT AXILLA;  Surgeon: Abigail Miyamoto, MD;  Location: [facility];  Service: General;  Laterality: Right;   BREAST CYST EXCISION Right    Patient does not recall (2014 or 2015)   BREAST LUMPECTOMY WITH RADIOACTIVE SEED AND SENTINEL LYMPH NODE BIOPSY Right 02/07/2021   Procedure: RIGHT BREAST LUMPECTOMY WITH RADIOACTIVE SEED AND SEED TARGETED LYMPH NODE BIOPSY AND SENTINEL LYMPH NODE BIOPSY;  Surgeon: Abigail Miyamoto, MD;  Location: [facility];  Service: General;  Laterality: Right;   CESAREAN SECTION     x2   COLONOSCOPY     IR IMAGING GUIDED PORT INSERTION  09/15/2020   IR REMOVAL TUN CV CATH W/O FL  09/15/2020   NODE DISSECTION Right 03/28/2021   Procedure: RIGHT AXILLARY LYMPH NODE DISSECTION;  Surgeon: Abigail Miyamoto, MD;  Location: [facility];  Service: General;  Laterality: Right;   PORT-A-CATH REMOVAL Left 02/07/2021   Procedure: REMOVAL PORT-A-CATH;  Surgeon: Abigail Miyamoto, MD;  Location: [facility];  Service: General;  Laterality: Left;   THYROIDECTOMY, PARTIAL     UPPER GASTROINTESTINAL ENDOSCOPY      Family History  Problem Relation Age of Onset   Hypertension Mother    Colon cancer Sister 34   Cancer Maternal Aunt        unknown type dx late 2s   Diabetes Maternal Grandmother    Breast cancer  Neg Hx    Ovarian cancer Neg Hx    Endometrial cancer Neg Hx    Pancreatic cancer Neg Hx    Prostate cancer Neg Hx    Esophageal cancer Neg Hx    Stomach cancer Neg Hx     Social History   Socioeconomic History   Marital status: Single    Spouse name: Not on file   Number of children: 2   Years of education: Not on file   Highest education level: Some college, no degree  Occupational History   Occupation: unemployed  Tobacco Use   Smoking status: Former    Packs/day: 1.00    Years: 22.00    Additional pack years: 0.00    Total pack years: 22.00    Types: Cigars, Cigarettes   Smokeless tobacco: Never   Tobacco comments:    quit 07/2020  Vaping Use   Vaping Use: Never used  Substance and Sexual Activity   Alcohol use: Yes    Comment: socially   Drug use: Yes    Types: Marijuana    Comment: daily   Sexual  activity: Not Currently    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  Other Topics Concern   Not on file  Social History Narrative   Not on file   Social Determinants of Health   Financial Resource Strain: High Risk (08/29/2021)   Overall Financial Resource Strain (CARDIA)    Difficulty of Paying Living Expenses: Hard  Food Insecurity: Food Insecurity Present (08/29/2021)   Hunger Vital Sign    Worried About Running Out of Food in the Last Year: Sometimes true    Ran Out of Food in the Last Year: Sometimes true  Transportation Needs: No Transportation Needs (08/29/2021)   PRAPARE - Administrator, Civil Service (Medical): No    Lack of Transportation (Non-Medical): No  Physical Activity: Inactive (08/29/2021)   Exercise Vital Sign    Days of Exercise per Week: 0 days    Minutes of Exercise per Session: 0 min  Stress: Stress Concern Present (08/29/2021)   [employer] of Occupational Health - Occupational Stress Questionnaire    Feeling of Stress : To some extent  Social Connections: Socially Isolated (08/29/2021)   Social Connection  and Isolation Panel [NHANES]    Frequency of Communication with Friends and Family: Three times a week    Frequency of Social Gatherings with Friends and Family: More than three times a week    Attends Religious Services: Never    Database administrator or Organizations: No    Attends Engineer, structural: Never    Marital Status: Never married    Current Medications:  Current Outpatient Medications:    ACCU-CHEK GUIDE test strip, USE TO MONITOR BLOOD GLUCOSE 3 TIME(S) DAILY, Disp: , Rfl:    Accu-Chek Softclix Lancets lancets, 3 (three) times daily., Disp: , Rfl:    acetaminophen (TYLENOL) 500 MG tablet, Take 2,000 mg by mouth every 8 (eight) hours as needed for moderate pain., Disp: , Rfl:    anastrozole (ARIMIDEX) 1 MG tablet, Take 1 tablet (1 mg total) by mouth daily., Disp: 90 tablet, Rfl: 3   atorvastatin (LIPITOR) 10 MG tablet, Take 1 tablet (10 mg total) by mouth daily., Disp: 30 tablet, Rfl: 5   Blood Glucose Monitoring Suppl (ACCU-CHEK GUIDE ME) w/Device KIT, See admin instructions., Disp: , Rfl:    escitalopram (LEXAPRO) 10 MG tablet, Take 1 tablet (10 mg total) by mouth daily., Disp: 30 tablet, Rfl: 2   famotidine (PEPCID) 40 MG tablet, Take 1 tablet (40 mg total) by mouth at bedtime., Disp: 30 tablet, Rfl: 2   fexofenadine (ALLEGRA) 180 MG tablet, Take 180 mg by mouth daily as needed for allergies., Disp: , Rfl:    fluticasone (FLONASE) 50 MCG/ACT nasal spray, Place 1 spray into both nostrils daily as needed for allergies., Disp: , Rfl:    hydrOXYzine (ATARAX) 10 MG tablet, Take 1 tablet by mouth 3 (three) times a day as needed for anxiety., Disp: 90 tablet, Rfl: 0   lidocaine (XYLOCAINE) 2 % solution, Use 15 mLs in the mouth or throat every 3 (three) hours as needed (Throat pain)., Disp: 100 mL, Rfl: 0   loratadine (CLARITIN) 10 MG tablet, Take 10 mg by mouth daily as needed for allergies., Disp: , Rfl:    Multiple Vitamins-Minerals (MULTIVITAMIN WOMEN) TABS, Take 1  tablet by mouth daily., Disp: , Rfl:    nystatin (MYCOSTATIN/NYSTOP) powder, Apply topically 2 (two) times a day as needed., Disp: 30 g, Rfl: 1   omeprazole (PRILOSEC) 40 MG capsule, Take 1 capsule (  40 mg total) by mouth daily for 8 weeks, Disp: 30 capsule, Rfl: 1   ondansetron (ZOFRAN) 4 MG tablet, Take 1 tablet (4 mg total) by mouth every 8 (eight) hours as needed for Nausea., Disp: 30 tablet, Rfl: 2   pregabalin (LYRICA) 200 MG capsule, Take 1 capsule (200 mg) by mouth 2 times daily., Disp: 60 capsule, Rfl: 3   Semaglutide, 2 MG/DOSE, (Melody Rodriguez, 2 MG/DOSE,) 8 MG/3ML SOPN, Inject 2 mg into the skin once a week., Disp: 3 mL, Rfl: 3   triamcinolone cream (KENALOG) 0.1 %, Apply topically 2 (two) times a day as needed., Disp: 40 g, Rfl: 1   glipiZIDE (GLUCOTROL) 10 MG tablet, Take 1 tablet (10 mg total) by mouth 2 (two) times daily. (Patient not taking: Reported on 02/06/2023), Disp: 60 tablet, Rfl: 2  Review of Systems: + Chills, fatigue, weight loss, ringing in ears, hot flashes, back pain, itch, rash, dizziness, migraines, numbness, anxiety, depression. Denies appetite changes, fevers. Denies hearing loss, neck lumps or masses, mouth sores or voice changes. Denies cough or wheezing.  Denies shortness of breath. Denies chest pain or palpitations. Denies leg swelling. Denies abdominal distention, pain, blood in stools, constipation, diarrhea, nausea, vomiting, or early satiety. Denies pain with intercourse, dysuria, frequency, hematuria or incontinence. Denies pelvic pain, vaginal bleeding or vaginal discharge.   Denies joint pain. Denies itching, rash, or wounds. Denies dizziness or seizures. Denies swollen lymph nodes or glands, denies easy bruising or bleeding. Denies confusion, or decreased concentration.  Physical Exam: BP 125/76 (BP Location: Left Arm, Patient Position: Sitting)   Pulse 79   Temp 98.5 F (36.9 C) (Oral)   Resp 20   Ht 5' 4.96" (1.65 m)   Wt 178 lb 6.4 oz (80.9 kg)    SpO2 100%   BMI 29.72 kg/m  General: Alert, oriented, no acute distress. HEENT: Normocephalic, atraumatic, sclera anicteric. Chest: Clear to auscultation bilaterally.  No wheezes or rhonchi. Cardiovascular: Regular rate and rhythm, no murmurs.  Laboratory & Radiologic Studies: Last pap 2022 - [facility]  Assessment & Plan: Melody Rodriguez is a 47 y.o. woman with ER+ breast cancer interested in therapeutic BSO.  The patient is overall doing well.  She continues on medical suppression for her estrogen receptor positive breast cancer and secondary to side effects related to this would like to proceed with therapeutic bilateral salpingo-oophorectomy.  This was previously scheduled and then the plan was changed to have a joint procedure with surgery for gastrectomy.  She is now going to undergo close surveillance rather than gastrectomy and is ready to proceed with therapeutic BSO.  She lost significant weight on Melody Rodriguez, notes good glycemic control.  I do not see a hemoglobin A1c since last fall.  Will plan to get this with her preoperative labs.  The patient was congratulated on her weight loss thus far.  We reviewed  the plan for a robotic assisted bilateral salpingo-oophorectomy, possible laparotomy. The risks of surgery were discussed in detail and she understands these to include infection; wound separation; hernia; injury to adjacent organs such as bowel, bladder, blood vessels, ureters and nerves; bleeding which may require blood transfusion; anesthesia risk; thromboembolic events; possible death; unforeseen complications; possible need for re-exploration; medical complications such as heart attack, stroke, pleural effusion and pneumonia. The patient will receive DVT and antibiotic prophylaxis as indicated. She voiced a clear understanding. She had the opportunity to ask questions. Perioperative instructions were reviewed with her. Prescriptions for post-op medications were sent to her pharmacy  of  choice.  28 minutes of total time was spent for this patient encounter, including preparation, face-to-face counseling with the patient and coordination of care, and documentation of the encounter.  Eugene Garnet, MD  Division of Gynecologic Oncology  Department of Obstetrics and Gynecology  [facility]

## 2023-02-09 ENCOUNTER — Telehealth: Payer: Self-pay

## 2023-02-09 LAB — HEMOGLOBIN A1C
Hgb A1c MFr Bld: 6.2 % — ABNORMAL HIGH (ref 4.8–5.6)
Mean Plasma Glucose: 131 mg/dL

## 2023-02-09 NOTE — Telephone Encounter (Signed)
-----   Message from Doylene Bode, NP sent at 02/09/2023  8:51 AM EDT ----- Please let patient know her A1C results. Keep up the good work on improving her glucose levels. ----- Message ----- From: Leory Plowman, Lab [facility] [place] Sent: 02/08/2023  11:21 AM EDT To: Doylene Bode, NP

## 2023-02-09 NOTE — Telephone Encounter (Signed)
Told Ms Ellenbecker the results of Hg A1c as noted below by Warner Mccreedy, NP.

## 2023-02-15 ENCOUNTER — Other Ambulatory Visit (HOSPITAL_COMMUNITY): Payer: Self-pay

## 2023-02-16 ENCOUNTER — Other Ambulatory Visit (HOSPITAL_COMMUNITY): Payer: Self-pay

## 2023-02-16 ENCOUNTER — Encounter: Payer: Self-pay | Admitting: Internal Medicine

## 2023-02-16 ENCOUNTER — Ambulatory Visit (AMBULATORY_SURGERY_CENTER): Payer: Medicaid Other | Admitting: Internal Medicine

## 2023-02-16 VITALS — BP 137/89 | HR 83 | Temp 98.0°F | Resp 13 | Ht 63.0 in | Wt 190.0 lb

## 2023-02-16 DIAGNOSIS — Z1509 Genetic susceptibility to other malignant neoplasm: Secondary | ICD-10-CM

## 2023-02-16 DIAGNOSIS — K222 Esophageal obstruction: Secondary | ICD-10-CM | POA: Diagnosis not present

## 2023-02-16 DIAGNOSIS — K2950 Unspecified chronic gastritis without bleeding: Secondary | ICD-10-CM | POA: Diagnosis not present

## 2023-02-16 DIAGNOSIS — C161 Malignant neoplasm of fundus of stomach: Secondary | ICD-10-CM | POA: Diagnosis not present

## 2023-02-16 MED ORDER — SODIUM CHLORIDE 0.9 % IV SOLN
500.0000 mL | INTRAVENOUS | Status: AC
Start: 2023-02-16 — End: ?

## 2023-02-16 MED ORDER — ONDANSETRON HCL 40 MG/20ML IJ SOLN
4.0000 mg | Freq: Once | INTRAMUSCULAR | Status: AC
Start: 2023-02-16 — End: 2023-02-16
  Administered 2023-02-16: 4 mg via INTRAVENOUS

## 2023-02-16 NOTE — Progress Notes (Signed)
Pt's states no medical or surgical changes since previsit or office visit. 

## 2023-02-16 NOTE — Progress Notes (Signed)
Called to room to assist during endoscopic procedure.  Patient ID and intended procedure confirmed with present staff. Received instructions for my participation in the procedure from the performing physician.  

## 2023-02-16 NOTE — Patient Instructions (Signed)
Await results from biopsies taken today  Resume previous diet and continue present medications    YOU HAD AN ENDOSCOPIC PROCEDURE TODAY AT THE [facility]:   Refer to the procedure report that was given to you for any specific questions about what was found during the examination.  If the procedure report does not answer your questions, please call your gastroenterologist to clarify.  If you requested that your care partner not be given the details of your procedure findings, then the procedure report has been included in a sealed envelope for you to review at your convenience later.  YOU SHOULD EXPECT: Some feelings of bloating in the abdomen. Passage of more gas than usual.  Walking can help get rid of the air that was put into your GI tract during the procedure and reduce the bloating. If you had a lower endoscopy (such as a colonoscopy or flexible sigmoidoscopy) you may notice spotting of blood in your stool or on the toilet paper. If you underwent a bowel prep for your procedure, you may not have a normal bowel movement for a few days.  Please Note:  You might notice some irritation and congestion in your nose or some drainage.  This is from the oxygen used during your procedure.  There is no need for concern and it should clear up in a day or so.  SYMPTOMS TO REPORT IMMEDIATELY:  Following upper endoscopy (EGD)  Vomiting of blood or coffee ground material  New chest pain or pain under the shoulder blades  Painful or persistently difficult swallowing  New shortness of breath  Fever of 100F or higher  Black, tarry-looking stools  For urgent or emergent issues, a gastroenterologist can be reached at any hour by calling [phone]. Do not use MyChart messaging for urgent concerns.    DIET:  We do recommend a small meal at first, but then you may proceed to your regular diet.  Drink plenty of fluids but you should avoid alcoholic beverages for 24 hours.  ACTIVITY:  You  should plan to take it easy for the rest of today and you should NOT DRIVE or use heavy machinery until tomorrow (because of the sedation medicines used during the test).    FOLLOW UP: Our staff will call the number listed on your records the next business day following your procedure.  We will call around 7:15- 8:00 am to check on you and address any questions or concerns that you may have regarding the information given to you following your procedure. If we do not reach you, we will leave a message.     If any biopsies were taken you will be contacted by phone or by letter within the next 1-3 weeks.  Please call us at [phone] if you have not heard about the biopsies in 3 weeks.    SIGNATURES/CONFIDENTIALITY: You and/or your care partner have signed paperwork which will be entered into your electronic medical record.  These signatures attest to the fact that that the information above on your After Visit Summary has been reviewed and is understood.  Full responsibility of the confidentiality of this discharge information lies with you and/or your care-partner.

## 2023-02-16 NOTE — Op Note (Signed)
[facility] Patient Name: Jahari Shoenfelt Procedure Date: 02/16/2023 9:34 AM MRN: [id number] Endoscopist: Madelyn Brunner [place] , , [id number] Age: 47 Referring MD:  Date of Birth: January 04, 1976 Gender: Female Account #: [id number] Procedure:                Upper GI endoscopy Indications:              Follow-up of signet cell carcinoma on biopsies,                            CTNNA1 mutation, hereditary diffuse gastric cancer                            syndrome Medicines:                Monitored Anesthesia Care Procedure:                Pre-Anesthesia Assessment:                           - Prior to the procedure, a History and Physical                            was performed, and patient medications and                            allergies were reviewed. The patient's tolerance of                            previous anesthesia was also reviewed. The risks                            and benefits of the procedure and the sedation                            options and risks were discussed with the patient.                            All questions were answered, and informed consent                            was obtained. Prior Anticoagulants: The patient has                            taken no anticoagulant or antiplatelet agents. ASA                            Grade Assessment: II - A patient with mild systemic                            disease. After reviewing the risks and benefits,                            the patient was deemed in satisfactory condition to  undergo the procedure.                           After obtaining informed consent, the endoscope was                            passed under direct vision. Throughout the                            procedure, the patient's blood pressure, pulse, and                            oxygen saturations were monitored continuously. The                            Olympus Scope [id number] was introduced  through the                            mouth, and advanced to the second part of duodenum.                            The upper GI endoscopy was accomplished without                            difficulty. The patient tolerated the procedure                            well. Scope In: Scope Out: Findings:                 Two benign-appearing, intrinsic mild stenoses were                            found in the distal esophagus. The narrowest                            stenosis measured less than one cm (in length). The                            stenoses were traversed.                           A hiatal hernia was present.                           Localized mildly erythematous mucosa without                            bleeding was found in the gastric antrum.                           Biopsies were taken with a cold forceps in the                            cardia, in the gastric fundus, in the gastric body,  at the incisura, at the transitional zone, and in                            the gastric antrum for histology.                           The examined duodenum was normal. Complications:            No immediate complications. Estimated Blood Loss:     Estimated blood loss was minimal. Impression:               - Benign-appearing esophageal stenoses.                           - Hiatal hernia.                           - Erythematous mucosa in the antrum.                           - Normal examined duodenum.                           - Biopsies were taken with a cold forceps for                            histology in the cardia, in the gastric fundus, in                            the gastric body, at the incisura, at the                            transitional zone, and in the gastric antrum. Recommendation:           - Discharge patient to home (with escort).                           - Await pathology results.                           - The findings and  recommendations were discussed                            with the patient. Dr Particia Lather "[place]" [place],  02/16/2023 10:04:02 AM

## 2023-02-16 NOTE — Progress Notes (Signed)
GASTROENTEROLOGY PROCEDURE H&P NOTE   Primary Care Physician: Barnie Mort, PA-C    Reason for Procedure:  CTNNA1 gene mutation, prior gastric biopsies with signet ring carcinoma  Plan:    EGD  Patient is appropriate for endoscopic procedure(s) in the ambulatory ([facility]) setting.  The nature of the procedure, as well as the risks, benefits, and alternatives were carefully and thoroughly reviewed with the patient. Ample time for discussion and questions allowed. The patient understood, was satisfied, and agreed to proceed.     HPI: Melody Rodriguez is a 47 y.o. female who presents for EGD for evaluation of CTNNA1 gene mutation, prior gastric biopsies with signet ring carcinoma.  Patient was most recently seen in the Gastroenterology Clinic on 10/19/22.  No interval change in medical history since that appointment. Please refer to that note for full details regarding GI history and clinical presentation.   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/18/2022   Stomach cancer   Depression    Diabetes mellitus without complication (HCC)    glipizide and actps; cbgs 200s fasting   Family history of colon cancer    GERD (gastroesophageal reflux disease)    Headache    History of goiter    History of radiation therapy    right breast/scv  05/10/21-07/01/21  Dr Antony Blackbird   Hyperlipidemia    Hypertension    Monoallelic mutation of CTNNA3 gene    Recurrent major depressive disorder [facility])     Past Surgical History:  Procedure Laterality Date   BREAST BIOPSY Right 02/18/2021   Procedure: EVACUATION HEMATOMA RIGHT AXILLA;  Surgeon: Abigail Miyamoto, MD;  Location: [facility];  Service: General;  Laterality: Right;   BREAST CYST EXCISION Right    Patient does not recall (2014 or 2015)   BREAST LUMPECTOMY WITH RADIOACTIVE SEED AND  SENTINEL LYMPH NODE BIOPSY Right 02/07/2021   Procedure: RIGHT BREAST LUMPECTOMY WITH RADIOACTIVE SEED AND SEED TARGETED LYMPH NODE BIOPSY AND SENTINEL LYMPH NODE BIOPSY;  Surgeon: Abigail Miyamoto, MD;  Location: [facility];  Service: General;  Laterality: Right;   CESAREAN SECTION     x2   COLONOSCOPY     IR IMAGING GUIDED PORT INSERTION  09/15/2020   IR REMOVAL TUN CV CATH W/O FL  09/15/2020   NODE DISSECTION Right 03/28/2021   Procedure: RIGHT AXILLARY LYMPH NODE DISSECTION;  Surgeon: Abigail Miyamoto, MD;  Location: [facility];  Service: General;  Laterality: Right;   PORT-A-CATH REMOVAL Left 02/07/2021   Procedure: REMOVAL PORT-A-CATH;  Surgeon: Abigail Miyamoto, MD;  Location:  SURGERY CENTER;  Service: General;  Laterality: Left;   THYROIDECTOMY, PARTIAL     UPPER GASTROINTESTINAL ENDOSCOPY      Prior to Admission medications   Medication Sig Start Date End Date Taking? Authorizing Provider  ACCU-CHEK GUIDE test strip USE TO MONITOR BLOOD GLUCOSE 3 TIME(S) DAILY 10/11/21   [provider]  Accu-Chek Softclix Lancets lancets 3 (three) times daily. 10/22/21   [provider]  acetaminophen (TYLENOL) 500 MG tablet Take 2,000 mg by mouth every 8 (eight) hours as needed for moderate pain.    [provider]  anastrozole (ARIMIDEX) 1 MG tablet Take 1 tablet (1 mg total) by mouth daily. 03/27/22   Serena Croissant,  MD  atorvastatin (LIPITOR) 10 MG tablet Take 1 tablet (10 mg total) by mouth daily. 09/13/22     Blood Glucose Monitoring Suppl (ACCU-CHEK GUIDE ME) w/Device KIT See admin instructions. 08/15/21   [provider]  escitalopram (LEXAPRO) 10 MG tablet Take 1 tablet (10 mg total) by mouth daily. 12/29/22     famotidine (PEPCID) 40 MG tablet Take 1 tablet (40 mg total) by mouth at bedtime. 11/20/22   Imogene Burn, MD  fexofenadine (ALLEGRA) 180 MG tablet Take 180 mg by mouth daily as needed for allergies. 01/13/20    [provider]  fluticasone (FLONASE) 50 MCG/ACT nasal spray Place 1 spray into both nostrils daily as needed for allergies. 01/13/20   [provider]  hydrOXYzine (ATARAX) 10 MG tablet Take 1 tablet by mouth 3 (three) times a day as needed for anxiety. 05/15/22     lidocaine (XYLOCAINE) 2 % solution Use 15 mLs in the mouth or throat every 3 (three) hours as needed (Throat pain). 09/01/22   Imogene Burn, MD  loratadine (CLARITIN) 10 MG tablet Take 10 mg by mouth daily as needed for allergies. 09/01/20   [provider]  Multiple Vitamins-Minerals (MULTIVITAMIN WOMEN) TABS Take 1 tablet by mouth daily.    [provider]  nystatin (MYCOSTATIN/NYSTOP) powder Apply topically 2 (two) times a day as needed. 05/16/22     omeprazole (PRILOSEC) 40 MG capsule Take 1 capsule (40 mg total) by mouth daily for 8 weeks 11/19/22   Imogene Burn, MD  ondansetron (ZOFRAN) 4 MG tablet Take 1 tablet (4 mg total) by mouth every 8 (eight) hours as needed for Nausea. 12/05/22     oxyCODONE (OXY IR/ROXICODONE) 5 MG immediate release tablet Take 1 tablet (5 mg) by mouth every 4 hours as needed for severe pain. For AFTER surgery only, do not take and drive 1/61/09   Cross, Efraim Kaufmann D, NP  pregabalin (LYRICA) 200 MG capsule Take 1 capsule (200 mg) by mouth 2 times daily. 02/05/23   Fanny Dance, MD  Semaglutide, 2 MG/DOSE, (OZEMPIC, 2 MG/DOSE,) 8 MG/3ML SOPN Inject 2 mg into the skin once a week. 11/20/22     senna-docusate (SENOKOT-S) 8.6-50 MG tablet Take 2 tablets by mouth at bedtime. For AFTER surgery, do not take if having diarrhea 02/08/23   Warner Mccreedy D, NP  triamcinolone cream (KENALOG) 0.1 % Apply topically 2 (two) times a day as needed. 08/22/21       Current Outpatient Medications  Medication Sig Dispense Refill   ACCU-CHEK GUIDE test strip USE TO MONITOR BLOOD GLUCOSE 3 TIME(S) DAILY     Accu-Chek Softclix Lancets lancets 3 (three) times daily.     acetaminophen (TYLENOL)  500 MG tablet Take 2,000 mg by mouth every 8 (eight) hours as needed for moderate pain.     anastrozole (ARIMIDEX) 1 MG tablet Take 1 tablet (1 mg total) by mouth daily. 90 tablet 3   atorvastatin (LIPITOR) 10 MG tablet Take 1 tablet (10 mg total) by mouth daily. 30 tablet 5   Blood Glucose Monitoring Suppl (ACCU-CHEK GUIDE ME) w/Device KIT See admin instructions.     escitalopram (LEXAPRO) 10 MG tablet Take 1 tablet (10 mg total) by mouth daily. 30 tablet 2   famotidine (PEPCID) 40 MG tablet Take 1 tablet (40 mg total) by mouth at bedtime. 30 tablet 2   fexofenadine (ALLEGRA) 180 MG tablet Take 180 mg by mouth daily as needed for allergies.     fluticasone (  FLONASE) 50 MCG/ACT nasal spray Place 1 spray into both nostrils daily as needed for allergies.     hydrOXYzine (ATARAX) 10 MG tablet Take 1 tablet by mouth 3 (three) times a day as needed for anxiety. 90 tablet 0   lidocaine (XYLOCAINE) 2 % solution Use 15 mLs in the mouth or throat every 3 (three) hours as needed (Throat pain). 100 mL 0   loratadine (CLARITIN) 10 MG tablet Take 10 mg by mouth daily as needed for allergies.     Multiple Vitamins-Minerals (MULTIVITAMIN WOMEN) TABS Take 1 tablet by mouth daily.     nystatin (MYCOSTATIN/NYSTOP) powder Apply topically 2 (two) times a day as needed. 30 g 1   omeprazole (PRILOSEC) 40 MG capsule Take 1 capsule (40 mg total) by mouth daily for 8 weeks 30 capsule 1   ondansetron (ZOFRAN) 4 MG tablet Take 1 tablet (4 mg total) by mouth every 8 (eight) hours as needed for Nausea. 30 tablet 2   oxyCODONE (OXY IR/ROXICODONE) 5 MG immediate release tablet Take 1 tablet (5 mg) by mouth every 4 hours as needed for severe pain. For AFTER surgery only, do not take and drive 15 tablet 0   pregabalin (LYRICA) 200 MG capsule Take 1 capsule (200 mg) by mouth 2 times daily. 60 capsule 3   Semaglutide, 2 MG/DOSE, (OZEMPIC, 2 MG/DOSE,) 8 MG/3ML SOPN Inject 2 mg into the skin once a week. 3 mL 3   senna-docusate  (SENOKOT-S) 8.6-50 MG tablet Take 2 tablets by mouth at bedtime. For AFTER surgery, do not take if having diarrhea 30 tablet 5   triamcinolone cream (KENALOG) 0.1 % Apply topically 2 (two) times a day as needed. 40 g 1   No current facility-administered medications for this visit.    Allergies as of 02/16/2023 - Review Complete 02/16/2023  Allergen Reaction Noted   Duloxetine Rash 04/13/2022   Keflex [cephalexin] Hives and Rash 11/03/2015   Tramadol Other (See Comments) 01/10/2022   Latex Itching and Swelling 07/07/2020   Naproxen Itching 05/02/2021    Family History  Problem Relation Age of Onset   Hypertension Mother    Colon cancer Sister 21   Cancer Maternal Aunt        unknown type dx late 6s   Diabetes Maternal Grandmother    Breast cancer Neg Hx    Ovarian cancer Neg Hx    Endometrial cancer Neg Hx    Pancreatic cancer Neg Hx    Prostate cancer Neg Hx    Esophageal cancer Neg Hx    Stomach cancer Neg Hx     Social History   Socioeconomic History   Marital status: Single    Spouse name: Not on file   Number of children: 2   Years of education: Not on file   Highest education level: Some college, no degree  Occupational History   Occupation: unemployed  Tobacco Use   Smoking status: Former    Packs/day: 1.00    Years: 22.00    Additional pack years: 0.00    Total pack years: 22.00    Types: Cigars, Cigarettes   Smokeless tobacco: Never   Tobacco comments:    quit 07/2020  Vaping Use   Vaping Use: Never used  Substance and Sexual Activity   Alcohol use: Yes    Comment: socially   Drug use: Yes    Types: Marijuana    Comment: daily   Sexual activity: Not Currently    Comment: chemo-induced- no periods any  longer  Other Topics Concern   Not on file  Social History Narrative   Not on file   Social Determinants of Health   Financial Resource Strain: High Risk (08/29/2021)   Overall Financial Resource Strain (CARDIA)    Difficulty of Paying Living  Expenses: Hard  Food Insecurity: Food Insecurity Present (08/29/2021)   Hunger Vital Sign    Worried About Running Out of Food in the Last Year: Sometimes true    Ran Out of Food in the Last Year: Sometimes true  Transportation Needs: No Transportation Needs (08/29/2021)   PRAPARE - Administrator, Civil Service (Medical): No    Lack of Transportation (Non-Medical): No  Physical Activity: Inactive (08/29/2021)   Exercise Vital Sign    Days of Exercise per Week: 0 days    Minutes of Exercise per Session: 0 min  Stress: Stress Concern Present (08/29/2021)   [employer] of Occupational Health - Occupational Stress Questionnaire    Feeling of Stress : To some extent  Social Connections: Socially Isolated (08/29/2021)   Social Connection and Isolation Panel [NHANES]    Frequency of Communication with Friends and Family: Three times a week    Frequency of Social Gatherings with Friends and Family: More than three times a week    Attends Religious Services: Never    Database administrator or Organizations: No    Attends Banker Meetings: Never    Marital Status: Never married  Intimate Partner Violence: Not At Risk (08/29/2021)   Humiliation, Afraid, Rape, and Kick questionnaire    Fear of Current or Ex-Partner: No    Emotionally Abused: No    Physically Abused: No    Sexually Abused: No    Physical Exam: Vital signs in last 24 hours: There were no vitals taken for this visit. GEN: NAD EYE: Sclerae anicteric ENT: MMM CV: Non-tachycardic Pulm: No increased WOB GI: Soft NEURO:  Alert & Oriented   Eulah Pont, MD [facility]   02/16/2023 8:58 AM

## 2023-02-16 NOTE — Progress Notes (Signed)
Vss nad trans to pacu 

## 2023-02-19 ENCOUNTER — Telehealth: Payer: Self-pay

## 2023-02-19 NOTE — Telephone Encounter (Signed)
  Follow up Call-     02/16/2023    9:21 AM 09/01/2022   10:11 AM  Call back number  Post procedure Call Back phone  # [phone]  Permission to leave phone message Yes Yes     Patient questions:  Do you have a fever, pain , or abdominal swelling? No. Pain Score  0 *  Have you tolerated food without any problems? Yes.    Have you been able to return to your normal activities? Yes.    Do you have any questions about your discharge instructions: Diet   No. Medications  No. Follow up visit  No.  Do you have questions or concerns about your Care? No.  Actions: * If pain score is 4 or above: No action needed, pain <4.

## 2023-02-21 ENCOUNTER — Telehealth: Payer: Self-pay | Admitting: Internal Medicine

## 2023-02-21 ENCOUNTER — Encounter: Payer: Self-pay | Admitting: Internal Medicine

## 2023-02-21 ENCOUNTER — Telehealth: Payer: Self-pay | Admitting: Surgery

## 2023-02-21 NOTE — Telephone Encounter (Signed)
Telephone call to check on pre-operative status.  Patient compliant with pre-operative instructions.  Reinforced nothing to eat after midnight. Clear liquids until 7:15am. Patient to arrive at 8:15am. Verified that post-op medications have been sent to patient's preferred pharmacy.  No questions or concerns voiced.  Instructed to call for any needs. 

## 2023-02-21 NOTE — Telephone Encounter (Signed)
Incoming call from patient states she is returning Dr Derek Mound call.

## 2023-02-21 NOTE — Progress Notes (Signed)
Sent the following message to her [facility] physician Dr. Estella Husk Zani:  "Dear Dr. Kathrynn Rodriguez,  I wanted to contact you regarding our mutual patient Ms. Melody Rodriguez. She has the CTNNA1 gene mutation and has been following with you regarding recommendations on her signet ring cell carcinoma. I believe you have discussed her case at [facility] previously and recommended surveillance EGD in 3 months. Originally she was going to be seen by [facility] for another EGD, but due to transportation constraints, she opted to continue her surveillance EGDs here in [place]. I did an EGD on her on 02/16/23 and performed surveillance biopsies in her stomach. 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 I know that she will be seeing you later this month for further guidance on whether to consider gastrectomy versus further surveillance. I tried to call her about the results earlier today and will try again later today or tomorrow. Please let me know if you have any questions or concerns.  Best, Melody Pont, MD"

## 2023-02-21 NOTE — Progress Notes (Signed)
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 This is the same finding that was seen on her last EGD. I told her that I had notified Dr. Kathrynn Ducking of these results. She has a surgery with [name] tomorrow and sees Dr. Kathrynn Ducking later this month to discuss next steps.

## 2023-02-21 NOTE — Telephone Encounter (Signed)
Attempted to reach patient to check in with her pre-operatively. Unable to reach patient. Left message requesting return call.   ?

## 2023-02-22 ENCOUNTER — Other Ambulatory Visit: Payer: Self-pay

## 2023-02-22 ENCOUNTER — Encounter (HOSPITAL_COMMUNITY): Payer: Self-pay | Admitting: Gynecologic Oncology

## 2023-02-22 ENCOUNTER — Ambulatory Visit (HOSPITAL_COMMUNITY): Payer: Medicaid Other | Admitting: Anesthesiology

## 2023-02-22 ENCOUNTER — Ambulatory Visit (HOSPITAL_BASED_OUTPATIENT_CLINIC_OR_DEPARTMENT_OTHER): Payer: Medicaid Other | Admitting: Anesthesiology

## 2023-02-22 ENCOUNTER — Ambulatory Visit (HOSPITAL_COMMUNITY)
Admission: RE | Admit: 2023-02-22 | Discharge: 2023-02-22 | Disposition: A | Discharge status: Home / Self Care | Payer: Medicaid Other | Source: Ambulatory Visit | Attending: Gynecologic Oncology | Admitting: Gynecologic Oncology

## 2023-02-22 ENCOUNTER — Encounter (HOSPITAL_COMMUNITY)
Admission: RE | Disposition: A | Discharge status: Home / Self Care | Payer: Self-pay | Source: Ambulatory Visit | Attending: Gynecologic Oncology

## 2023-02-22 DIAGNOSIS — Z170 Estrogen receptor positive status [ER+]: Secondary | ICD-10-CM | POA: Diagnosis not present

## 2023-02-22 DIAGNOSIS — Z923 Personal history of irradiation: Secondary | ICD-10-CM | POA: Insufficient documentation

## 2023-02-22 DIAGNOSIS — Z79899 Other long term (current) drug therapy: Secondary | ICD-10-CM | POA: Diagnosis not present

## 2023-02-22 DIAGNOSIS — K219 Gastro-esophageal reflux disease without esophagitis: Secondary | ICD-10-CM | POA: Diagnosis not present

## 2023-02-22 DIAGNOSIS — Z9221 Personal history of antineoplastic chemotherapy: Secondary | ICD-10-CM | POA: Insufficient documentation

## 2023-02-22 DIAGNOSIS — I10 Essential (primary) hypertension: Secondary | ICD-10-CM

## 2023-02-22 DIAGNOSIS — C50411 Malignant neoplasm of upper-outer quadrant of right female breast: Secondary | ICD-10-CM | POA: Diagnosis not present

## 2023-02-22 DIAGNOSIS — Z7985 Long-term (current) use of injectable non-insulin antidiabetic drugs: Secondary | ICD-10-CM | POA: Diagnosis not present

## 2023-02-22 DIAGNOSIS — Z5986 Financial insecurity: Secondary | ICD-10-CM | POA: Diagnosis not present

## 2023-02-22 DIAGNOSIS — Z4002 Encounter for prophylactic removal of ovary: Secondary | ICD-10-CM | POA: Diagnosis not present

## 2023-02-22 DIAGNOSIS — C50919 Malignant neoplasm of unspecified site of unspecified female breast: Secondary | ICD-10-CM | POA: Diagnosis not present

## 2023-02-22 DIAGNOSIS — E119 Type 2 diabetes mellitus without complications: Secondary | ICD-10-CM

## 2023-02-22 DIAGNOSIS — F32A Depression, unspecified: Secondary | ICD-10-CM | POA: Insufficient documentation

## 2023-02-22 DIAGNOSIS — Z79811 Long term (current) use of aromatase inhibitors: Secondary | ICD-10-CM | POA: Diagnosis not present

## 2023-02-22 DIAGNOSIS — F419 Anxiety disorder, unspecified: Secondary | ICD-10-CM | POA: Insufficient documentation

## 2023-02-22 DIAGNOSIS — Z7984 Long term (current) use of oral hypoglycemic drugs: Secondary | ICD-10-CM | POA: Diagnosis not present

## 2023-02-22 DIAGNOSIS — Z87891 Personal history of nicotine dependence: Secondary | ICD-10-CM

## 2023-02-22 HISTORY — PX: ROBOTIC ASSISTED BILATERAL SALPINGO OOPHERECTOMY: SHX6078

## 2023-02-22 LAB — GLUCOSE, CAPILLARY
Glucose-Capillary: 95 mg/dL (ref 70–99)
Glucose-Capillary: 98 mg/dL (ref 70–99)

## 2023-02-22 LAB — HCG, SERUM, QUALITATIVE: Preg, Serum: NEGATIVE

## 2023-02-22 SURGERY — SALPINGO-OOPHORECTOMY, BILATERAL, ROBOT-ASSISTED
Anesthesia: General | Site: Abdomen | Laterality: Bilateral

## 2023-02-22 MED ORDER — ONDANSETRON HCL 4 MG PO TABS: 4.0000 mg | ORAL_TABLET | Freq: Four times a day (QID) | ORAL | Status: DC | PRN

## 2023-02-22 MED ORDER — LIDOCAINE HCL (PF) 2 % IJ SOLN
INTRAMUSCULAR | Status: AC
  Filled 2023-02-22: qty 5

## 2023-02-22 MED ORDER — MIDAZOLAM HCL 2 MG/2ML IJ SOLN
INTRAMUSCULAR | Status: AC
  Filled 2023-02-22: qty 2

## 2023-02-22 MED ORDER — ACETAMINOPHEN 10 MG/ML IV SOLN
INTRAVENOUS | Status: AC
  Filled 2023-02-22: qty 100

## 2023-02-22 MED ORDER — HEPARIN SODIUM (PORCINE) 5000 UNIT/ML IJ SOLN
5000.0000 [IU] | INTRAMUSCULAR | Status: AC
  Administered 2023-02-22: 5000 [IU] via SUBCUTANEOUS
  Filled 2023-02-22: qty 1

## 2023-02-22 MED ORDER — KETAMINE HCL 10 MG/ML IJ SOLN
INTRAMUSCULAR | Status: DC | PRN
  Administered 2023-02-22: 30 mg via INTRAVENOUS

## 2023-02-22 MED ORDER — ACETAMINOPHEN 500 MG PO TABS
1000.0000 mg | ORAL_TABLET | ORAL | Status: AC
  Administered 2023-02-22: 1000 mg via ORAL
  Filled 2023-02-22: qty 2

## 2023-02-22 MED ORDER — LACTATED RINGERS IV SOLN: INTRAVENOUS | Status: DC

## 2023-02-22 MED ORDER — FENTANYL CITRATE PF 50 MCG/ML IJ SOSY
PREFILLED_SYRINGE | INTRAMUSCULAR | Status: AC
  Filled 2023-02-22: qty 1

## 2023-02-22 MED ORDER — ACETAMINOPHEN 160 MG/5ML PO SOLN: 1000.0000 mg | Freq: Once | ORAL | Status: DC | PRN

## 2023-02-22 MED ORDER — ROCURONIUM BROMIDE 100 MG/10ML IV SOLN
INTRAVENOUS | Status: DC | PRN
  Administered 2023-02-22: 70 mg via INTRAVENOUS

## 2023-02-22 MED ORDER — FENTANYL CITRATE (PF) 100 MCG/2ML IJ SOLN
INTRAMUSCULAR | Status: DC | PRN
  Administered 2023-02-22 (×3): 50 ug via INTRAVENOUS

## 2023-02-22 MED ORDER — OXYCODONE HCL 5 MG/5ML PO SOLN: 5.0000 mg | Freq: Once | ORAL | Status: AC | PRN

## 2023-02-22 MED ORDER — FENTANYL CITRATE (PF) 100 MCG/2ML IJ SOLN
INTRAMUSCULAR | Status: AC
  Filled 2023-02-22: qty 2

## 2023-02-22 MED ORDER — PROPOFOL 10 MG/ML IV BOLUS
INTRAVENOUS | Status: DC | PRN
  Administered 2023-02-22: 50 mg via INTRAVENOUS
  Administered 2023-02-22: 140 mg via INTRAVENOUS

## 2023-02-22 MED ORDER — SCOPOLAMINE 1 MG/3DAYS TD PT72
1.0000 | MEDICATED_PATCH | TRANSDERMAL | Status: DC
  Administered 2023-02-22: 1.5 mg via TRANSDERMAL
  Filled 2023-02-22: qty 1

## 2023-02-22 MED ORDER — SUGAMMADEX SODIUM 200 MG/2ML IV SOLN
INTRAVENOUS | Status: DC | PRN
  Administered 2023-02-22: 200 mg via INTRAVENOUS

## 2023-02-22 MED ORDER — ACETAMINOPHEN 10 MG/ML IV SOLN
1000.0000 mg | Freq: Once | INTRAVENOUS | Status: DC | PRN
  Administered 2023-02-22: 1000 mg via INTRAVENOUS

## 2023-02-22 MED ORDER — OXYCODONE HCL 5 MG PO TABS: 5.0000 mg | ORAL_TABLET | Freq: Once | ORAL | Status: AC | PRN

## 2023-02-22 MED ORDER — LACTATED RINGERS IR SOLN
Status: DC | PRN
  Administered 2023-02-22: 1000 mL

## 2023-02-22 MED ORDER — KETOROLAC TROMETHAMINE 15 MG/ML IJ SOLN
INTRAMUSCULAR | Status: DC | PRN
  Administered 2023-02-22: 15 mg via INTRAVENOUS

## 2023-02-22 MED ORDER — FENTANYL CITRATE PF 50 MCG/ML IJ SOSY
PREFILLED_SYRINGE | INTRAMUSCULAR | Status: AC
  Administered 2023-02-22: 50 ug via INTRAVENOUS
  Filled 2023-02-22: qty 1

## 2023-02-22 MED ORDER — CHLORHEXIDINE GLUCONATE 0.12 % MT SOLN
15.0000 mL | Freq: Once | OROMUCOSAL | Status: AC
  Administered 2023-02-22: 15 mL via OROMUCOSAL

## 2023-02-22 MED ORDER — ONDANSETRON HCL 4 MG/2ML IJ SOLN
INTRAMUSCULAR | Status: AC
  Filled 2023-02-22: qty 2

## 2023-02-22 MED ORDER — ORAL CARE MOUTH RINSE: 15.0000 mL | Freq: Once | OROMUCOSAL | Status: AC

## 2023-02-22 MED ORDER — FENTANYL CITRATE PF 50 MCG/ML IJ SOSY
PREFILLED_SYRINGE | INTRAMUSCULAR | Status: AC
  Administered 2023-02-22: 50 ug via INTRAVENOUS
  Filled 2023-02-22: qty 2

## 2023-02-22 MED ORDER — FENTANYL CITRATE PF 50 MCG/ML IJ SOSY
25.0000 ug | PREFILLED_SYRINGE | INTRAMUSCULAR | Status: DC | PRN
  Administered 2023-02-22: 50 ug via INTRAVENOUS

## 2023-02-22 MED ORDER — KETAMINE HCL 50 MG/5ML IJ SOSY
PREFILLED_SYRINGE | INTRAMUSCULAR | Status: AC
  Filled 2023-02-22: qty 5

## 2023-02-22 MED ORDER — LIDOCAINE HCL (CARDIAC) PF 100 MG/5ML IV SOSY
PREFILLED_SYRINGE | INTRAVENOUS | Status: DC | PRN
  Administered 2023-02-22: 60 mg via INTRAVENOUS

## 2023-02-22 MED ORDER — ROCURONIUM BROMIDE 10 MG/ML (PF) SYRINGE
PREFILLED_SYRINGE | INTRAVENOUS | Status: AC
  Filled 2023-02-22: qty 10

## 2023-02-22 MED ORDER — ONDANSETRON HCL 4 MG/2ML IJ SOLN
4.0000 mg | Freq: Four times a day (QID) | INTRAMUSCULAR | Status: DC | PRN
  Administered 2023-02-22: 4 mg via INTRAVENOUS

## 2023-02-22 MED ORDER — PROPOFOL 500 MG/50ML IV EMUL
INTRAVENOUS | Status: AC
  Filled 2023-02-22: qty 50

## 2023-02-22 MED ORDER — DEXAMETHASONE SODIUM PHOSPHATE 10 MG/ML IJ SOLN
INTRAMUSCULAR | Status: AC
  Filled 2023-02-22: qty 1

## 2023-02-22 MED ORDER — BUPIVACAINE HCL 0.25 % IJ SOLN
INTRAMUSCULAR | Status: DC | PRN
  Administered 2023-02-22: 30 mL

## 2023-02-22 MED ORDER — DEXAMETHASONE SODIUM PHOSPHATE 4 MG/ML IJ SOLN
4.0000 mg | INTRAMUSCULAR | Status: AC
  Administered 2023-02-22: 4 mg via INTRAVENOUS

## 2023-02-22 MED ORDER — OXYCODONE HCL 5 MG PO TABS
ORAL_TABLET | ORAL | Status: AC
  Administered 2023-02-22: 5 mg via ORAL
  Filled 2023-02-22: qty 1

## 2023-02-22 MED ORDER — BUPIVACAINE HCL 0.25 % IJ SOLN
INTRAMUSCULAR | Status: AC
  Filled 2023-02-22: qty 1

## 2023-02-22 MED ORDER — ONDANSETRON HCL 4 MG/2ML IJ SOLN
INTRAMUSCULAR | Status: DC | PRN
  Administered 2023-02-22: 4 mg via INTRAVENOUS

## 2023-02-22 MED ORDER — KETOROLAC TROMETHAMINE 30 MG/ML IJ SOLN
INTRAMUSCULAR | Status: AC
  Filled 2023-02-22: qty 1

## 2023-02-22 MED ORDER — ACETAMINOPHEN 500 MG PO TABS: 1000.0000 mg | ORAL_TABLET | Freq: Once | ORAL | Status: DC | PRN

## 2023-02-22 MED ORDER — PROPOFOL 10 MG/ML IV BOLUS
INTRAVENOUS | Status: AC
  Filled 2023-02-22: qty 20

## 2023-02-22 MED ORDER — MIDAZOLAM HCL 5 MG/5ML IJ SOLN
INTRAMUSCULAR | Status: DC | PRN
  Administered 2023-02-22: 2 mg via INTRAVENOUS

## 2023-02-22 MED ORDER — STERILE WATER FOR IRRIGATION IR SOLN
Status: DC | PRN
  Administered 2023-02-22: 1000 mL

## 2023-02-22 SURGICAL SUPPLY — 77 items
ADH SKN CLS APL DERMABOND .7 (GAUZE/BANDAGES/DRESSINGS) ×1
AGENT HMST KT MTR STRL THRMB (HEMOSTASIS)
APL ESCP 34 STRL LF DISP (HEMOSTASIS)
APPLICATOR SURGIFLO ENDO (HEMOSTASIS) IMPLANT
BAG COUNTER SPONGE SURGICOUNT (BAG) IMPLANT
BAG LAPAROSCOPIC 12 15 PORT 16 (BASKET) IMPLANT
BAG RETRIEVAL 12/15 (BASKET)
BAG SPNG CNTER NS LX DISP (BAG)
BLADE SURG SZ10 CARB STEEL (BLADE) IMPLANT
COVER BACK TABLE 60X90IN (DRAPES) ×1 IMPLANT
COVER TIP SHEARS 8 DVNC (MISCELLANEOUS) ×1 IMPLANT
DERMABOND ADVANCED .7 DNX12 (GAUZE/BANDAGES/DRESSINGS) ×1 IMPLANT
DRAPE ARM DVNC X/XI (DISPOSABLE) ×4 IMPLANT
DRAPE COLUMN DVNC XI (DISPOSABLE) ×1 IMPLANT
DRAPE SHEET LG 3/4 BI-LAMINATE (DRAPES) ×1 IMPLANT
DRAPE SURG IRRIG POUCH 19X23 (DRAPES) ×1 IMPLANT
DRIVER NDL MEGA SUTCUT DVNCXI (INSTRUMENTS) ×1 IMPLANT
DRIVER NDLE MEGA SUTCUT DVNCXI (INSTRUMENTS) ×1 IMPLANT
DRSG OPSITE POSTOP 4X6 (GAUZE/BANDAGES/DRESSINGS) IMPLANT
DRSG OPSITE POSTOP 4X8 (GAUZE/BANDAGES/DRESSINGS) IMPLANT
ELECT PENCIL ROCKER SW 15FT (MISCELLANEOUS) IMPLANT
ELECT REM PT RETURN 15FT ADLT (MISCELLANEOUS) ×1 IMPLANT
FORCEPS BPLR FENES DVNC XI (FORCEP) ×1 IMPLANT
FORCEPS PROGRASP DVNC XI (FORCEP) ×1 IMPLANT
GAUZE 4X4 16PLY ~~LOC~~+RFID DBL (SPONGE) ×2 IMPLANT
GLOVE BIO SURGEON STRL SZ 6 (GLOVE) ×4 IMPLANT
GLOVE BIO SURGEON STRL SZ 6.5 (GLOVE) ×1 IMPLANT
GOWN STRL REUS W/ TWL LRG LVL3 (GOWN DISPOSABLE) ×4 IMPLANT
GOWN STRL REUS W/TWL LRG LVL3 (GOWN DISPOSABLE) ×4
GRASPER SUT TROCAR 14GX15 (MISCELLANEOUS) IMPLANT
HOLDER FOLEY CATH W/STRAP (MISCELLANEOUS) IMPLANT
IRRIG SUCT STRYKERFLOW 2 WTIP (MISCELLANEOUS) ×1
IRRIGATION SUCT STRKRFLW 2 WTP (MISCELLANEOUS) ×1 IMPLANT
KIT PROCEDURE DVNC SI (MISCELLANEOUS) IMPLANT
KIT TURNOVER KIT A (KITS) IMPLANT
LIGASURE IMPACT 36 18CM CVD LR (INSTRUMENTS) IMPLANT
MANIPULATOR ADVINCU DEL 3.0 PL (MISCELLANEOUS) IMPLANT
MANIPULATOR ADVINCU DEL 3.5 PL (MISCELLANEOUS) IMPLANT
MANIPULATOR UTERINE 4.5 ZUMI (MISCELLANEOUS) IMPLANT
NDL HYPO 21X1.5 SAFETY (NEEDLE) ×1 IMPLANT
NDL SPNL 18GX3.5 QUINCKE PK (NEEDLE) IMPLANT
NEEDLE HYPO 21X1.5 SAFETY (NEEDLE) ×1 IMPLANT
NEEDLE SPNL 18GX3.5 QUINCKE PK (NEEDLE) IMPLANT
OBTURATOR OPTICAL STND 8 DVNC (TROCAR) ×1
OBTURATOR OPTICALSTD 8 DVNC (TROCAR) ×1 IMPLANT
PACK ROBOT GYN CUSTOM WL (TRAY / TRAY PROCEDURE) ×1 IMPLANT
PAD POSITIONING PINK XL (MISCELLANEOUS) ×1 IMPLANT
PORT ACCESS TROCAR AIRSEAL 12 (TROCAR) IMPLANT
SCISSORS MNPLR CVD DVNC XI (INSTRUMENTS) ×1 IMPLANT
SCRUB CHG 4% DYNA-HEX 4OZ (MISCELLANEOUS) IMPLANT
SEAL UNIV 5-12 XI (MISCELLANEOUS) ×4 IMPLANT
SET TRI-LUMEN FLTR TB AIRSEAL (TUBING) ×1 IMPLANT
SPIKE FLUID TRANSFER (MISCELLANEOUS) ×1 IMPLANT
SPONGE T-LAP 18X18 ~~LOC~~+RFID (SPONGE) IMPLANT
SURGIFLO W/THROMBIN 8M KIT (HEMOSTASIS) IMPLANT
SUT MNCRL AB 4-0 PS2 18 (SUTURE) IMPLANT
SUT PDS AB 1 TP1 96 (SUTURE) IMPLANT
SUT V-LOC 180 0-0 GS22 (SUTURE) IMPLANT
SUT VIC AB 0 CT1 27 (SUTURE)
SUT VIC AB 0 CT1 27XBRD ANTBC (SUTURE) IMPLANT
SUT VIC AB 2-0 CT1 27 (SUTURE)
SUT VIC AB 2-0 CT1 TAPERPNT 27 (SUTURE) IMPLANT
SUT VIC AB 4-0 PS2 18 (SUTURE) ×2 IMPLANT
SUT VICRYL 0 27 CT2 27 ABS (SUTURE) ×1 IMPLANT
SUT VLOC 180 0 9IN GS21 (SUTURE) IMPLANT
SYR 10ML LL (SYRINGE) IMPLANT
SYS BAG RETRIEVAL 10MM (BASKET) ×1
SYS WOUND ALEXIS 18CM MED (MISCELLANEOUS)
SYSTEM BAG RETRIEVAL 10MM (BASKET) IMPLANT
SYSTEM WOUND ALEXIS 18CM MED (MISCELLANEOUS) IMPLANT
TOWEL OR NON WOVEN STRL DISP B (DISPOSABLE) IMPLANT
TRAP SPECIMEN MUCUS 40CC (MISCELLANEOUS) IMPLANT
TRAY FOLEY MTR SLVR 16FR STAT (SET/KITS/TRAYS/PACK) ×1 IMPLANT
TROCAR PORT AIRSEAL 5X120 (TROCAR) IMPLANT
UNDERPAD 30X36 HEAVY ABSORB (UNDERPADS AND DIAPERS) ×2 IMPLANT
WATER STERILE IRR 1000ML POUR (IV SOLUTION) ×1 IMPLANT
YANKAUER SUCT BULB TIP 10FT TU (MISCELLANEOUS) IMPLANT

## 2023-02-22 NOTE — Op Note (Signed)
OPERATIVE NOTE  Pre-operative Diagnosis: ER+ breast cancer  Post-operative Diagnosis: same  Operation: Robotic-assisted laparoscopic bilateral salpingo-oophorectomy   Surgeon: Eugene Garnet MD  Assistant Surgeon: Warner Mccreedy, NP (an NP assistant was necessary for tissue manipulation, management of robotic instrumentation, retraction and positioning due to the complexity of the case and hospital policies).   Anesthesia: GET  Urine Output: 25 cc concentrated urine  Operative Findings: On EUA, small mobile uterus. ON intra-abdominal entry, normal upper abdominal survey. Normal omentum with minimal adhesions to the anterior abdominal wall at the umbilicus. No appreciable hernia. Normal small and large bowel. Uterus 8 cm and normal in appearance with normal appearing bilateral tubes and ovaries. No ascites.   Estimated Blood Loss:  25      Total IV Fluids: see I&O flowsheet         Specimens: bilateral tubes and ovaries         Complications:  None apparent; patient tolerated the procedure well.         Disposition: PACU - hemodynamically stable.  Procedure Details  The patient was seen in the Holding Room. The risks, benefits, complications, treatment options, and expected outcomes were discussed with the patient.  The patient concurred with the proposed plan, giving informed consent.  The site of surgery properly noted/marked. The patient was identified as Science writer and the procedure verified as a Robotic-assisted bilateral salpingo-oophorectomy with any other indicated procedures.   After induction of anesthesia, the patient was draped and prepped in the usual sterile manner. Patient was placed in supine position after anesthesia and draped and prepped in the usual sterile manner as follows: Her arms were tucked to her side with all appropriate precautions.  The shoulders were stabilized with padded shoulder blocks applied to the acromium processes.  The patient was placed in  the semi-lithotomy position in [place] stirrups.  The perineum and vagina were prepped with CHG. The patient's abdomen was prepped with ChloraPrep and then she was draped after the prep had been allowed to dry for 3 minutes.  A Time Out was held and the above information confirmed.  The urethra was prepped with Betadine. Foley catheter was placed.  A spongestick was placed within the vagina. OG tube placement was confirmed and to suction.   Next, a 10 mm skin incision was made 1 cm below the subcostal margin in the midclavicular line.  The 5 mm Optiview port and scope was used for direct entry.  Opening pressure was under 10 mm CO2.  The abdomen was insufflated and the findings were noted as above.   At this point and all points during the procedure, the patient's intra-abdominal pressure did not exceed 15 mmHg. Next, an 8 mm skin incision was made superior to the umbilicus and a right and left port were placed about 8 cm lateral to the robot port on the right and left side.  The 5 mm assist trocar was exchanged for a 10-12 mm port. All ports were placed under direct visualization.  The patient was placed in steep Trendelenburg.  Bowel was folded away into the upper abdomen.  The robot was docked in the normal manner.  Omental adhesions were lysed with monopolar electrocautery.  The right and left peritoneum were opened parallel to the IP ligament to open the retroperitoneal spaces bilaterally. The round ligaments were preserved. The ureter was noted to be on the medial leaf of the broad ligament.  The peritoneum above the ureter was incised and stretched and the infundibulopelvic  ligament was skeletonized, cauterized and cut.  The utero-ovarian ligament and fallopian tube were skeletonized, cauterized and transected just lateral to the uterine fundus, freeing the adnexa. Bilateral adnexa were placed in an Endocatch bag.  Irrigation was used and excellent hemostasis was achieved.  At this point in the  procedure was completed.  Robotic instruments were removed under direct visulaization.  The robot was undocked. The endocatch bag was delivered through the LUQ incision. The fascia at the 10-12 mm port was closed with 0 Vicryl using a PMI fascial closure device under direct visualization.  The subcuticular tissue was closed with 4-0 Vicryl and the skin was closed with 4-0 Monocryl in a subcuticular manner.  Dermabond was applied.    The vagina was swabbed with minimal bleeding noted. Foley catheter was removed.  All sponge, lap and needle counts were correct x  3.   The patient was transferred to the recovery room in stable condition.  Eugene Garnet, MD

## 2023-02-22 NOTE — Interval H&P Note (Signed)
History and Physical Interval Note:  02/22/2023 9:46 AM  Melody Rodriguez  has presented today for surgery, with the diagnosis of ESTROGEN RECEPTOR POSITIVE BREAST CANCER.  The various methods of treatment have been discussed with the patient and family. After consideration of risks, benefits and other options for treatment, the patient has consented to  Procedure(s): XI ROBOTIC ASSISTED LAPAROSCOPIC BILATERAL SALPINGO OOPHORECTOMY (Bilateral) as a surgical intervention.  The patient's history has been reviewed, patient examined, no change in status, stable for surgery.  I have reviewed the patient's chart and labs.  Questions were answered to the patient's satisfaction.     Carver Fila

## 2023-02-22 NOTE — Discharge Instructions (Addendum)
AFTER SURGERY INSTRUCTIONS   Return to work: 4-6 weeks if applicable  Plan to resume taking arimidex one week after surgery.   Activity: 1. Be up and out of the bed during the day.  Take a nap if needed.  You may walk up steps but be careful and use the hand rail.  Stair climbing will tire you more than you think, you may need to stop part way and rest.    2. No lifting or straining for 6 weeks over 10 pounds. No pushing, pulling, straining for 6 weeks.   3. No driving for around 1 week(s).  Do not drive if you are taking narcotic pain medicine and make sure that your reaction time has returned.    4. You can shower as soon as the next day after surgery. Shower daily.  Use your regular soap and water (not directly on the incision) and pat your incision(s) dry afterwards; don't rub.  No tub baths or submerging your body in water until cleared by your surgeon. If you have the soap that was given to you by pre-surgical testing that was used before surgery, you do not need to use it afterwards because this can irritate your incisions.    5. No sexual activity and nothing in the vagina for 4-6 weeks.   6. You may experience a small amount of clear drainage from your incisions, which is normal.  If the drainage persists, increases, or changes color please call the office.   7. Do not use creams, lotions, or ointments such as neosporin on your incisions after surgery until advised by your surgeon because they can cause removal of the dermabond glue on your incisions.     8. You may experience vaginal spotting after surgery.  The spotting is normal but if you experience heavy bleeding, call our office.   9. Take Tylenol first for pain if you are able to take these medications and only use narcotic pain medication for severe pain not relieved by the Tylenol.  Monitor your Tylenol intake to a max of 4,000 mg in a 24 hour period.    Diet: 1. Low sodium Heart Healthy Diet is recommended but you are  cleared to resume your normal (before surgery) diet after your procedure.   2. It is safe to use a laxative, such as Miralax or Colace, if you have difficulty moving your bowels. You have been prescribed Sennakot-S to take at bedtime every evening after surgery to keep bowel movements regular and to prevent constipation.     Wound Care: 1. Keep clean and dry.  Shower daily.   Reasons to call the Doctor: Fever - Oral temperature greater than 100.4 degrees Fahrenheit Foul-smelling vaginal discharge Difficulty urinating Nausea and vomiting Increased pain at the site of the incision that is unrelieved with pain medicine. Difficulty breathing with or without chest pain New calf pain especially if only on one side Sudden, continuing increased vaginal bleeding with or without clots.   Contacts: For questions or concerns you should contact:   Dr. Eugene Garnet at [phone]   Warner Mccreedy, NP at [phone]   After Hours: call [phone] and have the GYN Oncologist paged/contacted (after 5 pm or on the weekends). You will speak with an after hours RN and let he or she know you have had surgery.   Messages sent via mychart are for non-urgent matters and are not responded to after hours so for urgent needs, please call the after hours number.

## 2023-02-22 NOTE — Anesthesia Preprocedure Evaluation (Signed)
Anesthesia Evaluation  Patient identified by MRN, date of birth, ID band Patient awake    Reviewed: Allergy & Precautions, NPO status , Patient's Chart, lab work & pertinent test results  History of Anesthesia Complications Negative for: history of anesthetic complications  Airway Mallampati: III  TM Distance: >3 FB Neck ROM: Full    Dental  (+) Teeth Intact, Dental Advisory Given   Pulmonary neg shortness of breath, neg sleep apnea, neg COPD, neg recent URI, former smoker   breath sounds clear to auscultation       Cardiovascular hypertension, (-) angina (-) Past MI and (-) CHF  Rhythm:Regular     Neuro/Psych  Headaches PSYCHIATRIC DISORDERS Anxiety Depression       GI/Hepatic Neg liver ROS,GERD  Medicated and Controlled,,  Endo/Other  diabetes  Lab Results      Component                Value               Date                      HGBA1C                   6.2 (H)             02/08/2023             Renal/GU negative Renal ROS     Musculoskeletal  (+) Arthritis ,    Abdominal   Peds  Hematology negative hematology ROS (+) Lab Results      Component                Value               Date                      WBC                      8.2                 02/08/2023                HGB                      13.8                02/08/2023                HCT                      41.5                02/08/2023                MCV                      92.6                02/08/2023                PLT                      231                 02/08/2023  Anesthesia Other Findings   Reproductive/Obstetrics                             Anesthesia Physical Anesthesia Plan  ASA: 2  Anesthesia Plan: General   Post-op Pain Management: Tylenol PO (pre-op)*   Induction: Intravenous  PONV Risk Score and Plan: 3 and Ondansetron, Dexamethasone and Scopolamine patch - Pre-op  Airway  Management Planned: Oral ETT  Additional Equipment: None  Intra-op Plan:   Post-operative Plan: Extubation in OR  Informed Consent: I have reviewed the patients History and Physical, chart, labs and discussed the procedure including the risks, benefits and alternatives for the proposed anesthesia with the patient or authorized representative who has indicated his/her understanding and acceptance.     Dental advisory given  Plan Discussed with: CRNA  Anesthesia Plan Comments:        Anesthesia Quick Evaluation

## 2023-02-22 NOTE — Transfer of Care (Signed)
Immediate Anesthesia Transfer of Care Note  Patient: ALMETIA NOP  Procedure(s) Performed: XI ROBOTIC ASSISTED LAPAROSCOPIC BILATERAL SALPINGO OOPHORECTOMY (Bilateral: Abdomen)  Patient Location: PACU  Anesthesia Type:General  Level of Consciousness: awake, alert , oriented, and patient cooperative  Airway & Oxygen Therapy: Patient Spontanous Breathing and Patient connected to face mask oxygen  Post-op Assessment: Report given to RN, Post -op Vital signs reviewed and stable, and Patient moving all extremities  Post vital signs: Reviewed and stable  Last Vitals:  Vitals Value Taken Time  BP 149/80 02/22/23 1200  Temp    Pulse 79 02/22/23 1201  Resp 12 02/22/23 1201  SpO2 100 % 02/22/23 1201  Vitals shown include unvalidated device data.  Last Pain:  Vitals:   02/22/23 0846  TempSrc: Oral  PainSc:          Complications: No notable events documented.

## 2023-02-22 NOTE — Anesthesia Postprocedure Evaluation (Signed)
Anesthesia Post Note  Patient: Melody Rodriguez  Procedure(s) Performed: XI ROBOTIC ASSISTED LAPAROSCOPIC BILATERAL SALPINGO OOPHORECTOMY (Bilateral: Abdomen)     Patient location during evaluation: PACU Anesthesia Type: General Level of consciousness: awake and alert Pain management: pain level controlled Vital Signs Assessment: post-procedure vital signs reviewed and stable Respiratory status: spontaneous breathing, nonlabored ventilation, respiratory function stable and patient connected to nasal cannula oxygen Cardiovascular status: blood pressure returned to baseline and stable Postop Assessment: no apparent nausea or vomiting Anesthetic complications: no   No notable events documented.  Last Vitals:  Vitals:   02/22/23 1300 02/22/23 1317  BP: (!) 139/90 (!) 154/89  Pulse: 76 75  Resp: 13   Temp: 36.6 C 36.6 C  SpO2: 99% 99%    Last Pain:  Vitals:   02/22/23 1317  TempSrc:   PainSc: 0-No pain                 Dang Mathison

## 2023-02-22 NOTE — Anesthesia Procedure Notes (Signed)
Procedure Name: Intubation Date/Time: 02/22/2023 10:41 AM  Performed by: Johnette Abraham, CRNAPre-anesthesia Checklist: Patient identified, Emergency Drugs available, Suction available and Patient being monitored Patient Re-evaluated:Patient Re-evaluated prior to induction Oxygen Delivery Method: Circle System Utilized Preoxygenation: Pre-oxygenation with 100% oxygen Induction Type: IV induction Ventilation: Mask ventilation without difficulty Laryngoscope Size: Mac and 3 Grade View: Grade II Tube type: Oral Tube size: 7.0 mm Number of attempts: 1 Airway Equipment and Method: Stylet and Oral airway Placement Confirmation: ETT inserted through vocal cords under direct vision, positive ETCO2 and breath sounds checked- equal and bilateral Secured at: 22 cm Tube secured with: Tape Dental Injury: Teeth and Oropharynx as per pre-operative assessment

## 2023-02-23 ENCOUNTER — Encounter: Payer: Self-pay | Admitting: Surgery

## 2023-02-23 ENCOUNTER — Telehealth: Payer: Self-pay | Admitting: Surgery

## 2023-02-23 ENCOUNTER — Encounter (HOSPITAL_COMMUNITY): Payer: Self-pay | Admitting: Gynecologic Oncology

## 2023-02-23 NOTE — Telephone Encounter (Signed)
Called patient to check how she's doing this afternoon. Patient states she has not peed since this morning and was napping before the call. Asked patient to try to use the bathroom while on the call. She was able to urinate and states, "it seems like a good amount but my bladder doesn't feel empty." She states that usually when she goes to the bathroom she will sit there for awhile and go a little bit more. Reminded patient that she needs to attempt to urinate every 2 hours and that inability to empty her bladder could become an emergency if not addressed quickly so she should seek medical attention over the weekend if she is unable to urinate. Patient rates pain 7/10 at this time. No other concerns at this time.

## 2023-02-23 NOTE — Telephone Encounter (Signed)
Spoke with [name] this morning. She states she is eating, drinking and urinating well. She is experiencing some burning with urination. Advised patient this is most likely due to the catheter she had in place for surgery and should subside over the next 24 hours. Advised patient that if it doesn't begin to improve to call our office. She also states that she feels like when she urinates only a small amount comes out. Advised patient to urinate every 2 hours and monitor her output, and that our office will call her back this afternoon to assess how she's doing. She has had a BM and is passing gas. States BM was a little hard to get out. She is taking senokot as prescribed and encouraged her to drink plenty of water. Advised her to increase senokot to twice a day if this continues and to add miralax if needed. She denies fever or chills. Incisions are intact but the one above her navel is bleeding a small amount. She denies any purulent drainage. Advised patient to continue to monitor this and that it should subside in the next few days but that if it doesn't or it worsens or begins to have purulent drainage to call our office. She rates her pain 8/10. Her pain is controlled with Oxycodone and Tylenol.  Instructed to call office with any fever, chills, purulent drainage, uncontrolled pain or any other questions or concerns. Patient verbalizes understanding.   Pt aware of post op appointments as well as the office number [phone] and after hours number [phone] to call if she has any questions or concerns.

## 2023-02-26 ENCOUNTER — Telehealth: Payer: Self-pay | Admitting: *Deleted

## 2023-02-26 LAB — SURGICAL PATHOLOGY

## 2023-02-26 NOTE — Telephone Encounter (Signed)
Could someone call this patient to see how she is doing?

## 2023-02-26 NOTE — Telephone Encounter (Signed)
Attempted to reach patient in regards to a follow up on her symptoms. Left a voicemail for patient to call the office back at [phone].

## 2023-02-27 ENCOUNTER — Encounter: Payer: Self-pay | Admitting: Hematology and Oncology

## 2023-02-27 NOTE — Telephone Encounter (Signed)
Spoke with Ms. Melody Rodriguez. She states, "I am doing a lot better" in regards to her urinary symptoms. She denies burning, urgency and frequency. Patient also denies fever, chills and is having regular bowel movements and passing some gas. Pt is complaining of pain on her left side that she describes as soreness but also states sharp pain in her left shoulder that comes and goes after taking tylenol. Pt states she is only taking oxycodone once a day at bedtime. Patient states her incisions look good and are without redness or drainage. Patient advised to continue walking and moving but follow activity restrictions, no pulling, pushing or lifting anything heavier than 10 lbs. She is only 5 days post-op. Also advised patient to try herbal tea and mylicon chewables over the counter for the pain in her shoulder that could be gas related and it's ok to take more than just one oxycodone a day since she is still early in her  recovery from surgery.  Patient also advised to call the office with any worsening symptoms, signs & symptoms of infection or increased pain that is not be relieved with oxycodone or tylenol. Pt receptive and has no further questions or concerns at this time.

## 2023-03-01 ENCOUNTER — Ambulatory Visit: Payer: Medicaid Other

## 2023-03-01 ENCOUNTER — Telehealth: Payer: Self-pay | Admitting: Surgery

## 2023-03-01 ENCOUNTER — Telehealth: Payer: Self-pay | Admitting: *Deleted

## 2023-03-01 NOTE — Telephone Encounter (Signed)
Patient returned call regarding mychart message questions. Per Dr Pricilla Holm, Zoladex can be stopped after her ovaries have been removed but she should reach out to her doctor (pt sees Dr Pamelia Hoit) regarding cancelling appointments. Patient is also ok to resume ozempic this week. Patient verbalized understanding.   Patient states she is still having a lot of fatigue and pain, mostly with movement. Patient denies any other symptoms, no urinary or bowel concerns at this time. Denies fevers/chills. Advised patient to continue to follow lifting restrictions and to brace her abdomen with a pillow when she coughs. Advised patient to continue using Tylenol in addition to her Oxycodone as needed. Patient verbalized understanding and had no other concerns at this time. Call transferred to Dr Earmon Phoenix nurse at the end of call.

## 2023-03-01 NOTE — Telephone Encounter (Signed)
Received call from pt stating she underwent bilateral oophorectomy last week and needs to cancel upcoming Zoladex injection appts.  Appts successfully canceled.

## 2023-03-01 NOTE — Telephone Encounter (Signed)
Zoladex can be stopped after ovaries removed (but she should reach out to her med onc about canceling future visits). Fine to restart ozempic this week. Thanks!

## 2023-03-02 ENCOUNTER — Telehealth: Payer: Self-pay | Admitting: *Deleted

## 2023-03-02 NOTE — Telephone Encounter (Signed)
Spoke with Ms. Hanus to relay message from [name] that is ok for her to start Ozempic this week. Patient states she spoke with Dr. Pamelia Hoit in regards to her Zoladex. Pt thanked the office for calling. Patient also requesting refill on her oxycodone for soreness that she is still experiencing since surgery. Patient states the upper left side hurts the most. Patient also states she hasn't had a bowel movement in two days, but is still taking her stool softeners. Pt encouraged to keep herself hydrated.   Message relayed to provider in regards to patients request. Pt is ambulating and moving well and is taking tylenol for the pain that she rates 7/10. Pt is unable to take ibuprofen and states the tylenol works but doesn't take the pain away completely like the oxycodone. Patient was reassured that the area of discomfort will take a few more days to feel better, but in the meantime  Patient encouraged to continue to splint her incisions with a pillow to help support for pain control and she could also alternate using a heating pad and ice pack.  Patient advised to give the office a call back if the above recommendations do not relieve her pain so that we can evaluate in person.  Patient declined the offer for an appointment and hung up on the office.

## 2023-03-05 ENCOUNTER — Inpatient Hospital Stay: Payer: Medicaid Other

## 2023-03-05 ENCOUNTER — Other Ambulatory Visit: Payer: Self-pay | Admitting: Pharmacist

## 2023-03-05 ENCOUNTER — Encounter: Payer: Self-pay | Admitting: Hematology and Oncology

## 2023-03-05 DIAGNOSIS — Z170 Estrogen receptor positive status [ER+]: Secondary | ICD-10-CM

## 2023-03-05 NOTE — Progress Notes (Signed)
[facility]       Telephone: [phone]?Fax: [phone]      Oncology Clinical Pharmacist Practitioner Note  Melody Rodriguez is a 47 y.o. female with a diagnosis of breast cancer currently on anastrozole under the care of Dr. Serena Croissant.   Clinical pharmacy was made aware today that patient had recent BSO on 02/22/23. Goserelin has been removed from the plan per Dr. Pamelia Hoit.  Clinical pharmacy will continue to support Dominga Ferry and Dr. Serena Croissant as needed.  Anselm Lis, RPH-CPP,  03/05/2023  1:49 PM   **Disclaimer: This note was dictated with voice recognition software. Similar sounding words can inadvertently be transcribed and this note may contain transcription errors which may not have been corrected upon publication of note.**

## 2023-03-13 NOTE — Telephone Encounter (Signed)
Pathology report printed and faxed. Confirmed fax.

## 2023-03-13 NOTE — Telephone Encounter (Signed)
Inbound call from [facility] from [facility] requesting patient pathology report for colon procedure be faxed over to [phone] to [facility].  Please advise. Thank you

## 2023-03-15 ENCOUNTER — Encounter: Payer: Self-pay | Admitting: Hematology and Oncology

## 2023-03-23 ENCOUNTER — Encounter: Payer: Medicaid Other | Admitting: Gynecologic Oncology

## 2023-03-29 ENCOUNTER — Ambulatory Visit: Payer: Medicaid Other | Admitting: Hematology and Oncology

## 2023-03-29 ENCOUNTER — Encounter: Payer: Self-pay | Admitting: Gynecologic Oncology

## 2023-03-29 ENCOUNTER — Other Ambulatory Visit: Payer: Self-pay

## 2023-03-29 ENCOUNTER — Other Ambulatory Visit: Payer: Medicaid Other

## 2023-03-29 ENCOUNTER — Inpatient Hospital Stay: Payer: Medicaid Other | Attending: Hematology and Oncology | Admitting: Gynecologic Oncology

## 2023-03-29 ENCOUNTER — Ambulatory Visit: Payer: Medicaid Other

## 2023-03-29 VITALS — BP 138/84 | HR 91 | Temp 99.1°F | Resp 17 | Ht 64.0 in | Wt 176.4 lb

## 2023-03-29 DIAGNOSIS — Z79811 Long term (current) use of aromatase inhibitors: Secondary | ICD-10-CM | POA: Insufficient documentation

## 2023-03-29 DIAGNOSIS — Z90722 Acquired absence of ovaries, bilateral: Secondary | ICD-10-CM | POA: Insufficient documentation

## 2023-03-29 DIAGNOSIS — C169 Malignant neoplasm of stomach, unspecified: Secondary | ICD-10-CM | POA: Insufficient documentation

## 2023-03-29 DIAGNOSIS — C773 Secondary and unspecified malignant neoplasm of axilla and upper limb lymph nodes: Secondary | ICD-10-CM | POA: Insufficient documentation

## 2023-03-29 DIAGNOSIS — C50411 Malignant neoplasm of upper-outer quadrant of right female breast: Secondary | ICD-10-CM | POA: Insufficient documentation

## 2023-03-29 DIAGNOSIS — Z170 Estrogen receptor positive status [ER+]: Secondary | ICD-10-CM

## 2023-03-29 DIAGNOSIS — I890 Lymphedema, not elsewhere classified: Secondary | ICD-10-CM | POA: Insufficient documentation

## 2023-03-29 NOTE — Patient Instructions (Addendum)
It was good to see you today.  You are healing very well from surgery!  Please remember, you will continue to need Pap surveillance until 66.  You will be due for a Pap in 2025.  Please remember, no heavy lifting for at least 6 weeks after surgery.

## 2023-03-29 NOTE — Progress Notes (Addendum)
Gynecologic Oncology Return Clinic Visit  03/29/23  Reason for Visit: follow-up  Treatment History: Oncology History  Malignant neoplasm of upper-outer quadrant of right breast in female, estrogen receptor positive (HCC)  07/28/2020 Initial Diagnosis   Patient palpated a right breast mass for 1-2 years. Mammogram showed a 2.2cm mass at the 11 o'clock position with surrounding calcifications, 6.4cm in total extent, and up to 5 abnormal right axillary lymph nodes. 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    08/05/2020 Miscellaneous   MammaPrint: High risk luminal type B   08/11/2020 Genetic Testing   Negative genetic testing: no pathogenic variants detected in [facility] Breast Cancer STAT Panel or Common Hereditary Cancers panel. The report dates are August 11, 2020 and August 19, 2020, respectively. Two variants of uncertain signficance were detected - one in the CTNNA1 gene called c.86del and the second in the MLH1 gene called c.808A>G.   UPDATE:  The MLH1 c.808A>G VUS was reclassified to "Likely Benign" on 02/19/2021. The change in variant classification was made as a result of re-review of the evidence in light of new variant interpretation guidelines and/or new information.   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 The amended report date is February 16, 2022.   The STAT Breast cancer panel offered by [facility] includes sequencing and rearrangement analysis for the following 9 genes:  ATM, BRCA1, BRCA2, CDH1, CHEK2, PALB2, PTEN, STK11 and TP53.  The Common Hereditary Cancers Panel offered by [facility] includes sequencing and/or deletion duplication testing of the following 48 genes: APC, ATM, AXIN2, BARD1, BMPR1A, BRCA1, BRCA2, BRIP1, CDH1, CDK4, CDKN2A (p14ARF), CDKN2A (p16INK4a), CHEK2, CTNNA1, DICER1, EPCAM (Deletion/duplication  testing only), GREM1 (promoter region deletion/duplication testing only), KIT, MEN1, MLH1, MSH2, MSH3, MSH6, MUTYH, NBN, NF1, NTHL1, PALB2, PDGFRA, PMS2, POLD1, POLE, PTEN, RAD50, RAD51C, RAD51D, RNF43, SDHB, SDHC, SDHD, SMAD4, SMARCA4. STK11, TP53, TSC1, TSC2, and VHL.  The following genes were evaluated for sequence changes only: SDHA and HOXB13 c.251G>A variant only.    09/01/2020 - 01/11/2021 Neo-Adjuvant Chemotherapy   Adriamycin and Cytoxan x4 09/01/2020-10/12/2020 Weekly Taxol x 12  10/26/2020-01/11/2021(dose reduced d/t AE)   02/07/2021 Surgery   Right lumpectomy Magnus Ivan): invasive and in situ ductal carcinoma, 2.8cm, clear margins, with metastatic carcinoma in 1/1 right axillary lymph nodes.   03/28/2021 Surgery   Axillary lymph node dissection: 3/7 lymph nodes +   05/10/2021 - 07/01/2021 Radiation Therapy   Site Technique Total Dose (Gy) Dose per Fx (Gy) Completed Fx Beam Energies  Breast, Right: Breast_Rt 3D 50.4/50.4 1.8 28/28 10X  Breast, Right: Breast_Rt_Bst specialPort 12/12 2 6/6 15E  Sclav-RT: SCV_Rt 3D 50.4/50.4 1.8 28/28      07/2021 -  Anti-estrogen oral therapy   Zoladex + Letrozole + Verzenio     Interval History: Doing well.  Still has some pain related to left upper quadrant incision.  Denies significant intra-abdominal pain.  Reports baseline bowel bladder function.  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   Stomach cancer   Depression    Diabetes mellitus without complication (HCC)    glipizide and actps; cbgs 200s fasting   Family  history of colon cancer    GERD (gastroesophageal reflux disease)    Headache    History of goiter    History of radiation therapy    right breast/scv  05/10/21-07/01/21  Dr Antony Blackbird   Hyperlipidemia    Hypertension    Monoallelic mutation of  CTNNA3 gene    Recurrent major depressive disorder [facility])     Past Surgical History:  Procedure Laterality Date   BREAST BIOPSY Right 02/18/2021   Procedure: EVACUATION HEMATOMA RIGHT AXILLA;  Surgeon: Abigail Miyamoto, MD;  Location: [facility];  Service: General;  Laterality: Right;   BREAST CYST EXCISION Right    Patient does not recall (2014 or 2015)   BREAST LUMPECTOMY WITH RADIOACTIVE SEED AND SENTINEL LYMPH NODE BIOPSY Right 02/07/2021   Procedure: RIGHT BREAST LUMPECTOMY WITH RADIOACTIVE SEED AND SEED TARGETED LYMPH NODE BIOPSY AND SENTINEL LYMPH NODE BIOPSY;  Surgeon: Abigail Miyamoto, MD;  Location: [facility];  Service: General;  Laterality: Right;   CESAREAN SECTION     x2   COLONOSCOPY     IR IMAGING GUIDED PORT INSERTION  09/15/2020   IR REMOVAL TUN CV CATH W/O FL  09/15/2020   NODE DISSECTION Right 03/28/2021   Procedure: RIGHT AXILLARY LYMPH NODE DISSECTION;  Surgeon: Abigail Miyamoto, MD;  Location: [facility];  Service: General;  Laterality: Right;   PORT-A-CATH REMOVAL Left 02/07/2021   Procedure: REMOVAL PORT-A-CATH;  Surgeon: Abigail Miyamoto, MD;  Location:  SURGERY CENTER;  Service: General;  Laterality: Left;   ROBOTIC ASSISTED BILATERAL SALPINGO OOPHERECTOMY Bilateral 02/22/2023   Procedure: XI ROBOTIC ASSISTED LAPAROSCOPIC BILATERAL SALPINGO OOPHORECTOMY;  Surgeon: Carver Fila, MD;  Location: [facility];  Service: Gynecology;  Laterality: Bilateral;   THYROIDECTOMY, PARTIAL     UPPER GASTROINTESTINAL ENDOSCOPY      Family History  Problem Relation Age of Onset   Hypertension Mother    Colon cancer Sister 42   Cancer Maternal Aunt        unknown type dx late 15s   Diabetes Maternal Grandmother    Breast cancer Neg Hx    Ovarian cancer Neg Hx    Endometrial cancer Neg Hx    Pancreatic cancer Neg Hx    Prostate cancer Neg Hx    Esophageal cancer Neg Hx    Stomach cancer Neg Hx     Social  History   Socioeconomic History   Marital status: Single    Spouse name: Not on file   Number of children: 2   Years of education: Not on file   Highest education level: Some college, no degree  Occupational History   Occupation: unemployed  Tobacco Use   Smoking status: Former    Current packs/day: 1.00    Average packs/day: 1 pack/day for 22.0 years (22.0 ttl pk-yrs)    Types: Cigars, Cigarettes   Smokeless tobacco: Never   Tobacco comments:    quit 07/2020  Vaping Use   Vaping status: Never Used  Substance and Sexual Activity   Alcohol use: Yes    Comment: socially   Drug use: Yes    Types: Marijuana    Comment: daily   Sexual activity: Not Currently    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  Other Topics Concern   Not on file  Social History Narrative   Not on file   Social Determinants of Health   Financial Resource Strain: High Risk (10/09/2022)   Received from [facility], [facility]  Overall Financial Resource Strain (CARDIA)    Difficulty of Paying Living Expenses: Very hard  Food Insecurity: Food Insecurity Present (10/09/2022)   Received from [facility], [facility]   Hunger Vital Sign    Worried About Running Out of Food in the Last Year: Sometimes true    Ran Out of Food in the Last Year: Often true  Transportation Needs: No Transportation Needs (10/09/2022)   Received from [facility], [facility]   PRAPARE - Transportation    Lack of Transportation (Medical): No    Lack of Transportation (Non-Medical): No  Physical Activity: Inactive (10/09/2022)   Received from [facility], [facility]   Exercise Vital Sign    Days of Exercise per Week: 5 days    Minutes of Exercise per Session: 0 min  Stress: Stress Concern Present (10/09/2022)   Received from [facility], [facility] - Occupational Stress Questionnaire    Feeling of Stress : Very much  Social Connections: Moderately  Integrated (10/09/2022)   Received from [facility], [facility]   Social Network    How would you rate your social network (family, work, friends)?: Adequate participation with social networks    Current Medications:  Current Outpatient Medications:    ACCU-CHEK GUIDE test strip, USE TO MONITOR BLOOD GLUCOSE 3 TIME(S) DAILY, Disp: , Rfl:    Accu-Chek Softclix Lancets lancets, 3 (three) times daily., Disp: , Rfl:    acetaminophen (TYLENOL) 500 MG tablet, Take 2,000 mg by mouth every 8 (eight) hours as needed for moderate pain., Disp: , Rfl:    anastrozole (ARIMIDEX) 1 MG tablet, Take 1 tablet (1 mg total) by mouth daily., Disp: 90 tablet, Rfl: 3   atorvastatin (LIPITOR) 10 MG tablet, Take 1 tablet (10 mg total) by mouth daily., Disp: 30 tablet, Rfl: 5   Blood Glucose Monitoring Suppl (ACCU-CHEK GUIDE ME) w/Device KIT, See admin instructions., Disp: , Rfl:    escitalopram (LEXAPRO) 10 MG tablet, Take 1 tablet (10 mg total) by mouth daily., Disp: 30 tablet, Rfl: 2   famotidine (PEPCID) 40 MG tablet, Take 1 tablet (40 mg total) by mouth at bedtime., Disp: 30 tablet, Rfl: 2   fexofenadine (ALLEGRA) 180 MG tablet, Take 180 mg by mouth daily as needed for allergies., Disp: , Rfl:    fluticasone (FLONASE) 50 MCG/ACT nasal spray, Place 1 spray into both nostrils daily as needed for allergies., Disp: , Rfl:    hydrOXYzine (ATARAX) 10 MG tablet, Take 1 tablet by mouth 3 (three) times a day as needed for anxiety., Disp: 90 tablet, Rfl: 0   lidocaine (XYLOCAINE) 2 % solution, Use 15 mLs in the mouth or throat every 3 (three) hours as needed (Throat pain)., Disp: 100 mL, Rfl: 0   loratadine (CLARITIN) 10 MG tablet, Take 10 mg by mouth daily as needed for allergies., Disp: , Rfl:    Multiple Vitamins-Minerals (MULTIVITAMIN WOMEN) TABS, Take 1 tablet by mouth daily., Disp: , Rfl:    nystatin (MYCOSTATIN/NYSTOP) powder, Apply topically 2 (two) times a day as needed., Disp: 30 g, Rfl: 1   omeprazole  (PRILOSEC) 40 MG capsule, Take 1 capsule (40 mg total) by mouth daily for 8 weeks, Disp: 30 capsule, Rfl: 1   ondansetron (ZOFRAN) 4 MG tablet, Take 1 tablet (4 mg total) by mouth every 8 (eight) hours as needed for Nausea., Disp: 30 tablet, Rfl: 2   oxyCODONE (OXY IR/ROXICODONE) 5 MG immediate release tablet, Take 1 tablet (5  mg) by mouth every 4 hours as needed for severe pain. For AFTER surgery only, do not take and drive (Patient not taking: Reported on 02/16/2023), Disp: 15 tablet, Rfl: 0   pregabalin (LYRICA) 200 MG capsule, Take 1 capsule (200 mg) by mouth 2 times daily., Disp: 60 capsule, Rfl: 3   Semaglutide, 2 MG/DOSE, (OZEMPIC, 2 MG/DOSE,) 8 MG/3ML SOPN, Inject 2 mg into the skin once a week., Disp: 3 mL, Rfl: 3   senna-docusate (SENOKOT-S) 8.6-50 MG tablet, Take 2 tablets by mouth at bedtime. For AFTER surgery, do not take if having diarrhea (Patient not taking: Reported on 02/16/2023), Disp: 30 tablet, Rfl: 5   triamcinolone cream (KENALOG) 0.1 %, Apply topically 2 (two) times a day as needed., Disp: 40 g, Rfl: 1  Current Facility-Administered Medications:    0.9 %  sodium chloride infusion, 500 mL, Intravenous, Continuous, Imogene Burn, MD  Review of Systems: + Fatigue, weight change, ringing in the ears, intermittent palpitations, hot flashes, joint pain, muscle pain/cramps, dizziness, migraines, depression, anxiety, decreased concentration. Denies appetite changes, fevers, chills. Denies hearing loss, neck lumps or masses, mouth sores or voice changes. Denies cough or wheezing.  Denies shortness of breath. Denies chest pain. Denies leg swelling. Denies abdominal distention, pain, blood in stools, constipation, diarrhea, nausea, vomiting, or early satiety. Denies pain with intercourse, dysuria, frequency, hematuria or incontinence. Denies pelvic pain, vaginal bleeding or vaginal discharge.   Denies back pain. Denies itching, rash, or wounds. Denies numbness or seizures. Denies  swollen lymph nodes or glands, denies easy bruising or bleeding.  Physical Exam: BP 138/84 (BP Location: Left Arm, Patient Position: Sitting)   Pulse 91   Temp 99.1 F (37.3 C) (Oral)   Resp 17   Ht 5\' 4"  (1.626 m)   Wt 176 lb 6.4 oz (80 kg)   SpO2 100%   BMI 30.28 kg/m  General: Alert, oriented, no acute distress. HEENT: Normocephalic, atraumatic, sclera anicteric. Chest: Unlabored breathing on room air. Abdomen: soft, nontender.  Normoactive bowel sounds.  No masses or hepatosplenomegaly appreciated.  Well-healed incisions.  Laboratory & Radiologic Studies: A. OVARIES AND FALLOPIAN TUBES, BILATERAL, SALPINGO OOPHORECTOMY:  - Bilateral benign ovaries and fallopian tubes with no significant  pathologic findings  - Negative for carcinoma   Assessment & Plan: Melody Rodriguez is a 47 y.o. woman s/p therapeutic BSO for ER+ breast cancer.  Doing well, meeting milestones.  Discussed continued expectations and restrictions.  Patient was given a copy of her pathology report.  She is working to get follow-up scheduled at [facility] in the setting of her stomach cancer.  16 minutes of total time was spent for this patient encounter, including preparation, face-to-face counseling with the patient and coordination of care, and documentation of the encounter.  Eugene Garnet, MD  Division of Gynecologic Oncology  Department of Obstetrics and Gynecology  [facility]

## 2023-03-30 ENCOUNTER — Other Ambulatory Visit: Payer: Self-pay

## 2023-03-30 DIAGNOSIS — C50411 Malignant neoplasm of upper-outer quadrant of right female breast: Secondary | ICD-10-CM

## 2023-03-31 NOTE — Progress Notes (Signed)
Patient Care Team: Modesta Messing as PCP - General (Physician Assistant) Abigail Miyamoto, MD as Consulting Physician (General Surgery) Serena Croissant, MD as Consulting Physician (Hematology and Oncology) Antony Blackbird, MD as Consulting Physician (Radiation Oncology)  DIAGNOSIS:  Encounter Diagnosis  Name Primary?   Malignant neoplasm of upper-outer quadrant of right breast in female, estrogen receptor positive (HCC) Yes    SUMMARY OF ONCOLOGIC HISTORY: Oncology History  Malignant neoplasm of upper-outer quadrant of right breast in female, estrogen receptor positive (HCC)  07/28/2020 Initial Diagnosis   Patient palpated a right breast mass for 1-2 years. Mammogram showed a 2.2cm mass at the 11 o'clock position with surrounding calcifications, 6.4cm in total extent, and up to 5 abnormal right axillary lymph nodes. 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    08/05/2020 Miscellaneous   MammaPrint: High risk luminal type B   08/11/2020 Genetic Testing   Negative genetic testing: no pathogenic variants detected in [facility] Breast Cancer STAT Panel or Common Hereditary Cancers panel. The report dates are August 11, 2020 and August 19, 2020, respectively. Two variants of uncertain signficance were detected - one in the CTNNA1 gene called c.86del and the second in the MLH1 gene called c.808A>G.   UPDATE:  The MLH1 c.808A>G VUS was reclassified to "Likely Benign" on 02/19/2021. The change in variant classification was made as a result of re-review of the evidence in light of new variant interpretation guidelines and/or new information.   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 The amended report date is February 16, 2022.   The STAT Breast cancer panel offered by [facility] includes sequencing and rearrangement analysis for  the following 9 genes:  ATM, BRCA1, BRCA2, CDH1, CHEK2, PALB2, PTEN, STK11 and TP53.  The Common Hereditary Cancers Panel offered by [facility] includes sequencing and/or deletion duplication testing of the following 48 genes: APC, ATM, AXIN2, BARD1, BMPR1A, BRCA1, BRCA2, BRIP1, CDH1, CDK4, CDKN2A (p14ARF), CDKN2A (p16INK4a), CHEK2, CTNNA1, DICER1, EPCAM (Deletion/duplication testing only), GREM1 (promoter region deletion/duplication testing only), KIT, MEN1, MLH1, MSH2, MSH3, MSH6, MUTYH, NBN, NF1, NTHL1, PALB2, PDGFRA, PMS2, POLD1, POLE, PTEN, RAD50, RAD51C, RAD51D, RNF43, SDHB, SDHC, SDHD, SMAD4, SMARCA4. STK11, TP53, TSC1, TSC2, and VHL.  The following genes were evaluated for sequence changes only: SDHA and HOXB13 c.251G>A variant only.    09/01/2020 - 01/11/2021 Neo-Adjuvant Chemotherapy   Adriamycin and Cytoxan x4 09/01/2020-10/12/2020 Weekly Taxol x 12  10/26/2020-01/11/2021(dose reduced d/t AE)   02/07/2021 Surgery   Right lumpectomy Magnus Ivan): invasive and in situ ductal carcinoma, 2.8cm, clear margins, with metastatic carcinoma in 1/1 right axillary lymph nodes.   03/28/2021 Surgery   Axillary lymph node dissection: 3/7 lymph nodes +   05/10/2021 - 07/01/2021 Radiation Therapy   Site Technique Total Dose (Gy) Dose per Fx (Gy) Completed Fx Beam Energies  Breast, Right: Breast_Rt 3D 50.4/50.4 1.8 28/28 10X  Breast, Right: Breast_Rt_Bst specialPort 12/12 2 6/6 15E  Sclav-RT: SCV_Rt 3D 50.4/50.4 1.8 28/28      07/2021 -  Anti-estrogen oral therapy   Zoladex + Letrozole + Verzenio     CHIEF COMPLIANT: Follow-up Letrozole    INTERVAL HISTORY: Melody Rodriguez is a 47 y.o female on anti-estrogen therapy. She presents to the clinic today for follow-up. Pt reports Severe right arm lymphedema: With severe pain and limitation of range of motion. She  also just got diagnose with stomach cancer. Pt no longer taking the Zoladex since she underwent bilateral salpingo-oophorectomy.  She is completely  disabled and unable to work.   ALLERGIES:  is allergic to duloxetine, keflex [cephalexin], tramadol, latex, and naproxen.  MEDICATIONS:  Current Outpatient Medications  Medication Sig Dispense Refill   ACCU-CHEK GUIDE test strip USE TO MONITOR BLOOD GLUCOSE 3 TIME(S) DAILY     Accu-Chek Softclix Lancets lancets 3 (three) times daily.     acetaminophen (TYLENOL) 500 MG tablet Take 2,000 mg by mouth every 8 (eight) hours as needed for moderate pain.     anastrozole (ARIMIDEX) 1 MG tablet Take 1 tablet (1 mg total) by mouth daily. 90 tablet 3   atorvastatin (LIPITOR) 10 MG tablet Take 1 tablet (10 mg total) by mouth daily. 30 tablet 5   Blood Glucose Monitoring Suppl (ACCU-CHEK GUIDE ME) w/Device KIT See admin instructions.     escitalopram (LEXAPRO) 10 MG tablet Take 1 tablet (10 mg total) by mouth daily. 30 tablet 2   famotidine (PEPCID) 40 MG tablet Take 1 tablet (40 mg total) by mouth at bedtime. 30 tablet 2   fexofenadine (ALLEGRA) 180 MG tablet Take 180 mg by mouth daily as needed for allergies.     fluticasone (FLONASE) 50 MCG/ACT nasal spray Place 1 spray into both nostrils daily as needed for allergies.     hydrOXYzine (ATARAX) 10 MG tablet Take 1 tablet by mouth 3 (three) times a day as needed for anxiety. 90 tablet 0   lidocaine (XYLOCAINE) 2 % solution Use 15 mLs in the mouth or throat every 3 (three) hours as needed (Throat pain). 100 mL 0   loratadine (CLARITIN) 10 MG tablet Take 10 mg by mouth daily as needed for allergies.     Multiple Vitamins-Minerals (MULTIVITAMIN WOMEN) TABS Take 1 tablet by mouth daily.     nystatin (MYCOSTATIN/NYSTOP) powder Apply topically 2 (two) times a day as needed. 30 g 1   omeprazole (PRILOSEC) 40 MG capsule Take 1 capsule (40 mg total) by mouth daily for 8 weeks 30 capsule 1   ondansetron (ZOFRAN) 4 MG tablet Take 1 tablet (4 mg total) by mouth every 8 (eight) hours as needed for Nausea. 30 tablet 2   oxyCODONE (OXY IR/ROXICODONE) 5 MG immediate  release tablet Take 1 tablet (5 mg) by mouth every 4 hours as needed for severe pain. For AFTER surgery only, do not take and drive 15 tablet 0   pregabalin (LYRICA) 200 MG capsule Take 1 capsule (200 mg) by mouth 2 times daily. 60 capsule 3   Semaglutide, 2 MG/DOSE, (OZEMPIC, 2 MG/DOSE,) 8 MG/3ML SOPN Inject 2 mg into the skin once a week. 3 mL 3   senna-docusate (SENOKOT-S) 8.6-50 MG tablet Take 2 tablets by mouth at bedtime. For AFTER surgery, do not take if having diarrhea 30 tablet 5   triamcinolone cream (KENALOG) 0.1 % Apply topically 2 (two) times a day as needed. 40 g 1   Current Facility-Administered Medications  Medication Dose Route Frequency Provider Last Rate Last Admin   0.9 %  sodium chloride infusion  500 mL Intravenous Continuous Imogene Burn, MD        PHYSICAL EXAMINATION: ECOG PERFORMANCE STATUS: 2 - Symptomatic, <50% confined to bed  Vitals:   04/02/23 0922 04/02/23 0923  BP: (!) 151/90 (!) 155/95  Pulse: 79   Resp: 18   Temp: (!) 97.5 F (36.4 C)   SpO2: 99%    Filed  Weights   04/02/23 0922  Weight: 176 lb 11.2 oz (80.2 kg)      LABORATORY DATA:  I have reviewed the data as listed    Latest Ref Rng & Units 04/02/2023    8:39 AM 02/08/2023   10:18 AM 09/08/2022   10:38 AM  CMP  Glucose 70 - 99 mg/dL 086  96    BUN 6 - 20 mg/dL 11  13    Creatinine 5.78 - 1.00 mg/dL 4.69  6.29  5.28   Sodium 135 - 145 mmol/L 142  141    Potassium 3.5 - 5.1 mmol/L 4.1  4.3    Chloride 98 - 111 mmol/L 108  108    CO2 22 - 32 mmol/L 28  23    Calcium 8.9 - 10.3 mg/dL 9.5  9.1    Total Protein 6.5 - 8.1 g/dL 7.2  7.5    Total Bilirubin 0.3 - 1.2 mg/dL 0.4  0.7    Alkaline Phos 38 - 126 U/L 62  58    AST 15 - 41 U/L 11  18    ALT 0 - 44 U/L 10  18      Lab Results  Component Value Date   WBC 6.6 04/02/2023   HGB 13.3 04/02/2023   HCT 39.2 04/02/2023   MCV 90.5 04/02/2023   PLT 205 04/02/2023   NEUTROABS 4.4 04/02/2023    ASSESSMENT & PLAN:  Malignant  neoplasm of upper-outer quadrant of right breast in female, estrogen receptor positive (HCC) 07/28/2020:Patient palpated a right breast mass for 1-2 years. Mammogram showed a 2.2cm mass at the 11 o'clock position with surrounding calcifications, 6.4cm in total extent, and up to 5 abnormal right axillary lymph nodes. 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   Treatment plan: 1. Neoadjuvant chemotherapy (MammaPrint test High Risk): AC foll by Taxol completed 01/11/21 2. 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 Adjuvant radiation therapy 05/11/2021-07/04/2021 4. Follow-up adjuvant antiestrogen therapy along with abemaciclib (patient has total of 4 lymph nodes positive), but she declined because of concern for toxicities URCC 16070: Treatment of refractory nausea 5. ALND 03/28/21: 3/7 LN positive -------------------------------------------------------------------------------------------------------------------------------  Current treatment: Zoladex plus letrozole (patient did not want to start Verzenio because of concern for toxicities).  Zoladex discontinued status post bilateral salpingo-oophorectomy on 02/22/2023  Patient stopped taking letrozole until the stomach cancer surgery happens. 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  (CTNNA1 gene mutation)   Bone Density: 08/31/21: T Score 1.1 (Normal) Mammograms: 11/13/2022: Benign, density Cat B   Severe right arm lymphedema: With severe pain and limitation of range of motion  Return to clinic in 1 year for follow-up    No orders of the defined types were placed in this  encounter.  The patient has a good understanding of the overall plan. she agrees with it. she will call with any problems that may develop before the next visit here. Total time spent: 30 mins including face to face time and time spent for planning, charting and co-ordination of care   Tamsen Meek, MD 04/02/23    I Janan Ridge am acting as a Neurosurgeon for  Dr.Karel Turpen  I have reviewed the above documentation for accuracy and completeness, and I agree with the above.

## 2023-04-02 ENCOUNTER — Inpatient Hospital Stay: Payer: Medicaid Other | Admitting: Hematology and Oncology

## 2023-04-02 ENCOUNTER — Inpatient Hospital Stay: Payer: Medicaid Other

## 2023-04-02 ENCOUNTER — Ambulatory Visit: Payer: Medicaid Other

## 2023-04-02 ENCOUNTER — Other Ambulatory Visit: Payer: Self-pay

## 2023-04-02 VITALS — BP 155/95 | HR 79 | Temp 97.5°F | Resp 18 | Ht 64.0 in | Wt 176.7 lb

## 2023-04-02 DIAGNOSIS — C50411 Malignant neoplasm of upper-outer quadrant of right female breast: Secondary | ICD-10-CM | POA: Diagnosis present

## 2023-04-02 DIAGNOSIS — Z170 Estrogen receptor positive status [ER+]: Secondary | ICD-10-CM

## 2023-04-02 DIAGNOSIS — C169 Malignant neoplasm of stomach, unspecified: Secondary | ICD-10-CM | POA: Diagnosis not present

## 2023-04-02 DIAGNOSIS — Z79811 Long term (current) use of aromatase inhibitors: Secondary | ICD-10-CM | POA: Diagnosis not present

## 2023-04-02 DIAGNOSIS — I890 Lymphedema, not elsewhere classified: Secondary | ICD-10-CM | POA: Diagnosis not present

## 2023-04-02 DIAGNOSIS — Z90722 Acquired absence of ovaries, bilateral: Secondary | ICD-10-CM | POA: Diagnosis not present

## 2023-04-02 DIAGNOSIS — C773 Secondary and unspecified malignant neoplasm of axilla and upper limb lymph nodes: Secondary | ICD-10-CM | POA: Diagnosis present

## 2023-04-02 LAB — CBC WITH DIFFERENTIAL (CANCER CENTER ONLY)
Abs Immature Granulocytes: 0.01 10*3/uL (ref 0.00–0.07)
Basophils Absolute: 0.1 10*3/uL (ref 0.0–0.1)
Basophils Relative: 1 %
Eosinophils Absolute: 0.2 10*3/uL (ref 0.0–0.5)
Eosinophils Relative: 3 %
HCT: 39.2 % (ref 36.0–46.0)
Hemoglobin: 13.3 g/dL (ref 12.0–15.0)
Immature Granulocytes: 0 %
Lymphocytes Relative: 26 %
Lymphs Abs: 1.7 10*3/uL (ref 0.7–4.0)
MCH: 30.7 pg (ref 26.0–34.0)
MCHC: 33.9 g/dL (ref 30.0–36.0)
MCV: 90.5 fL (ref 80.0–100.0)
Monocytes Absolute: 0.3 10*3/uL (ref 0.1–1.0)
Monocytes Relative: 4 %
Neutro Abs: 4.4 10*3/uL (ref 1.7–7.7)
Neutrophils Relative %: 66 %
Platelet Count: 205 10*3/uL (ref 150–400)
RBC: 4.33 MIL/uL (ref 3.87–5.11)
RDW: 12.4 % (ref 11.5–15.5)
WBC Count: 6.6 10*3/uL (ref 4.0–10.5)
nRBC: 0 % (ref 0.0–0.2)

## 2023-04-02 LAB — CMP (CANCER CENTER ONLY)
ALT: 10 U/L (ref 0–44)
AST: 11 U/L — ABNORMAL LOW (ref 15–41)
Albumin: 4.3 g/dL (ref 3.5–5.0)
Alkaline Phosphatase: 62 U/L (ref 38–126)
Anion gap: 6 (ref 5–15)
BUN: 11 mg/dL (ref 6–20)
CO2: 28 mmol/L (ref 22–32)
Calcium: 9.5 mg/dL (ref 8.9–10.3)
Chloride: 108 mmol/L (ref 98–111)
Creatinine: 0.67 mg/dL (ref 0.44–1.00)
GFR, Estimated: >60 mL/min (ref >60)
Glucose, Bld: 105 mg/dL — ABNORMAL HIGH (ref 70–99)
Potassium: 4.1 mmol/L (ref 3.5–5.1)
Sodium: 142 mmol/L (ref 135–145)
Total Bilirubin: 0.4 mg/dL (ref 0.3–1.2)
Total Protein: 7.2 g/dL (ref 6.5–8.1)

## 2023-04-02 LAB — MAGNESIUM: Magnesium: 1.9 mg/dL (ref 1.7–2.4)

## 2023-04-02 NOTE — Assessment & Plan Note (Signed)
07/28/2020:Patient palpated a right breast mass for 1-2 years. Mammogram showed a 2.2cm mass at the 11 o'clock position with surrounding calcifications, 6.4cm in total extent, and up to 5 abnormal right axillary lymph nodes. 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   Treatment plan: 1. Neoadjuvant chemotherapy (MammaPrint test High Risk): AC foll by Taxol completed 01/11/21 2. 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 Adjuvant radiation therapy 05/11/2021-07/04/2021 4. Follow-up adjuvant antiestrogen therapy along with abemaciclib (patient has total of 4 lymph nodes positive), but she declined because of concern for toxicities URCC 16070: Treatment of refractory nausea 5. ALND 03/28/21: 3/7 LN positive -------------------------------------------------------------------------------------------------------------------------------  Current treatment: Zoladex plus letrozole (patient did not want to start Verzenio because of concern for toxicities).  Zoladex discontinued status post bilateral salpingo-oophorectomy on 02/22/2023  Toxicities: Leg pains: And joint stiffness and achiness Hot flashes mild to moderate   Suspect her pains are from diabetes complications Bone Density: 08/31/21: T Score 1.1 (Normal) Mammograms: 11/13/2022: Benign, density Cat B   Severe right arm lymphedema: With severe pain and limitation of range of motion  Return to clinic in 1 year for follow-up

## 2023-04-03 ENCOUNTER — Telehealth: Payer: Self-pay | Admitting: Hematology and Oncology

## 2023-04-03 NOTE — Telephone Encounter (Signed)
Scheduled appointment per 7/15 los. Patient is aware of the made appointment.

## 2023-04-07 ENCOUNTER — Other Ambulatory Visit: Payer: Self-pay | Admitting: Internal Medicine

## 2023-04-07 ENCOUNTER — Other Ambulatory Visit (HOSPITAL_COMMUNITY): Payer: Self-pay

## 2023-04-07 DIAGNOSIS — R1310 Dysphagia, unspecified: Secondary | ICD-10-CM

## 2023-04-08 ENCOUNTER — Other Ambulatory Visit: Payer: Self-pay

## 2023-04-09 ENCOUNTER — Encounter: Payer: Self-pay | Admitting: Licensed Clinical Social Worker

## 2023-04-09 ENCOUNTER — Other Ambulatory Visit (HOSPITAL_COMMUNITY): Payer: Self-pay

## 2023-04-09 MED ORDER — OMEPRAZOLE 40 MG PO CPDR
40.0000 mg | DELAYED_RELEASE_CAPSULE | Freq: Every day | ORAL | 1 refills | Status: DC
Start: 2023-04-09 — End: 2023-05-18
  Filled 2023-04-09: qty 30, 30d supply, fill #0

## 2023-04-09 NOTE — Progress Notes (Signed)
[facility] CSW Progress Note  Clinical Child psychotherapist  received e-mail from patient  to request information on any financial assistance due to new cancer diagnosis (stomach cancer). CSW shared information on [facility].  Also recommended that, if pt is being treated elsewhere, to connect with CSW there to determine if there are additional internal resources.    Melody Rodriguez E Feliza Diven, LCSW Clinical Social Worker [facility]    Patient is participating in a Managed Medicaid Plan:  Yes

## 2023-04-10 ENCOUNTER — Other Ambulatory Visit (HOSPITAL_COMMUNITY): Payer: Self-pay

## 2023-04-10 MED ORDER — OZEMPIC (2 MG/DOSE) 8 MG/3ML ~~LOC~~ SOPN
2.0000 mg | PEN_INJECTOR | SUBCUTANEOUS | 3 refills | Status: DC
  Filled 2023-04-10: qty 3, 28d supply, fill #0
  Filled 2023-05-17 – 2023-05-18 (×2): qty 3, 28d supply, fill #1

## 2023-04-26 ENCOUNTER — Ambulatory Visit: Payer: Medicaid Other

## 2023-05-08 ENCOUNTER — Encounter: Payer: Medicaid Other | Admitting: Physical Medicine & Rehabilitation

## 2023-05-15 ENCOUNTER — Other Ambulatory Visit (HOSPITAL_COMMUNITY): Payer: Self-pay

## 2023-05-15 MED ORDER — PREGABALIN 20 MG/ML PO SOLN
200.0000 mg | Freq: Two times a day (BID) | ORAL | 0 refills | Status: DC
  Filled 2023-05-15: qty 473, 24d supply, fill #0

## 2023-05-15 MED ORDER — OXYCODONE HCL 5 MG/5ML PO SOLN
5.0000 mg | ORAL | 0 refills | Status: DC | PRN
Start: 2023-05-15 — End: 2023-10-02
  Filled 2023-05-15: qty 473, 16d supply, fill #0

## 2023-05-15 MED ORDER — ONDANSETRON HCL 4 MG/5ML PO SOLN
4.0000 mg | Freq: Three times a day (TID) | ORAL | 0 refills | Status: DC | PRN
  Filled 2023-05-15: qty 12, 1d supply, fill #0
  Filled 2023-05-15: qty 38, 3d supply, fill #0

## 2023-05-15 MED ORDER — ACETAMINOPHEN 160 MG/5ML PO ELIX
640.0000 mg | ORAL_SOLUTION | Freq: Four times a day (QID) | ORAL | 0 refills | Status: DC | PRN
  Filled 2023-05-15: qty 354, 5d supply, fill #0

## 2023-05-16 ENCOUNTER — Other Ambulatory Visit (HOSPITAL_COMMUNITY): Payer: Self-pay

## 2023-05-16 MED ORDER — HYDROXYZINE HCL 10 MG/5ML PO SYRP
ORAL_SOLUTION | ORAL | 2 refills | Status: DC
  Filled 2023-05-16: qty 240, 12d supply, fill #0
  Filled 2023-08-23: qty 240, 12d supply, fill #1

## 2023-05-17 ENCOUNTER — Other Ambulatory Visit (HOSPITAL_COMMUNITY): Payer: Self-pay

## 2023-05-17 MED ORDER — BLOOD GLUCOSE TEST VI STRP
ORAL_STRIP | 5 refills | Status: DC
  Filled 2023-08-11: qty 50, 50d supply, fill #0

## 2023-05-18 ENCOUNTER — Other Ambulatory Visit (HOSPITAL_COMMUNITY): Payer: Self-pay

## 2023-05-18 ENCOUNTER — Encounter: Payer: Medicaid Other | Attending: Physical Medicine & Rehabilitation | Admitting: Physical Medicine & Rehabilitation

## 2023-05-18 ENCOUNTER — Encounter: Payer: Self-pay | Admitting: Physical Medicine & Rehabilitation

## 2023-05-18 VITALS — BP 111/75 | HR 80 | Ht 64.0 in | Wt 171.0 lb

## 2023-05-18 DIAGNOSIS — M7501 Adhesive capsulitis of right shoulder: Secondary | ICD-10-CM | POA: Diagnosis not present

## 2023-05-18 DIAGNOSIS — F0630 Mood disorder due to known physiological condition, unspecified: Secondary | ICD-10-CM

## 2023-05-18 DIAGNOSIS — G90511 Complex regional pain syndrome I of right upper limb: Secondary | ICD-10-CM | POA: Insufficient documentation

## 2023-05-18 NOTE — Progress Notes (Signed)
Subjective:    Patient ID: Melody Rodriguez, female    DOB: 06-15-1976, 47 y.o.   MRN: [id number]  HPI   HPI 44/42 47 year old female with past medical history of diabetes mellitus, breast cancer status post lumpectomy with radioactive seeds 02/07/2021, breast biopsy 02/18/2021 and node dissection 03/28/2021.  Patient reports she began having pain in her right upper extremity after her surgeries for treatment of breast cancer.  She developed pain in her shoulder and the pain spread to her entire arm.  Her arm is very sensitive to touch and has increased swelling.  She reports it has appeared red and swollen at times.  She has difficulty flexing or abducting her shoulder.  She keeps the arm by her side with elbow flexed and wrist flexed.  She had worked with PT although she had to stop it due to pain.  She reports the function in her right arm improved after the physical therapy.  She also has pain when she looks to the right with her head.  She cannot sleep on her right side and uses pillows to avoid sensation to her right arm.  Mobic does not provide significant benefit, 2x shoulder injections did not help the pain.  She reports her mood is decreased due to the pain.   Visit 06/19/22 Melody Rodriguez is here for follow-up of her right upper extremity pain.  She reports pain is roughly similar to her last visit.  She is able to reach her arm a little bit higher to get things on shelves at home.  She still cannot lift above her shoulder level on her right.  She continues to have burning, tingling and stabbing pains in her right upper back, shoulder, upper arm, elbow and sometimes hand and wrist.  She is trying to use her arm for more activities at home however says this is difficult.  She is not interested in physical therapy at this time.  Patient reports she is taking gabapentin 100 mg 3 times a day.  She says this is causing her some mild sedation.     Interval History 10/03/22 Melody Rodriguez is here for follow-up  of her chronic pain in her right upper extremity.  She continues to have pain in her arm, shoulder and around lats on her right side.  She reports Lyrica did improve the pain a little bit.  She is not having any significant side effects with the medication.  She continues to have difficulty using her upper extremity especially for activities where her shoulder is abducted.  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  She has a visit with oncology to discuss treatment options for this cancer.   Interval History 12/04/22 Melody Rodriguez is here for her chronic pain follow-up.  She reports pain is largely unchanged from last visit.  She is not having any side effects with the Lyrica.  She continues to have pain and weakness in her shoulder.  She is not interested in injections at this time.  She would not like to retry physical therapy.  She also does not want to try any new medications, although prior medication fills have been about $4, she says multiple medications would be too costly for her at this time.  She also feels like she has tried enough medications for the time being.  We discussed trying a higher dose of Lyrica, she says she will contact me when she is out of her current medication and consider  an increased dose.  She reports decreased mood however no SI or HI.   Interval History 02/05/23 Pain is largely unchanged from last visit.  She continues to have pain throughout her right upper extremity.  We discussed trying a higher dose of Lyrica as this does provide some benefit, patient is in agreement.  Does not have any significant side effects with the medication currently.  She is currently using CBD products for her pain.  We discussed considering tramadol, she would like to hold off at this time.  She also acknowledges that CBD products may possibly contain THC which would not be distinguishable on UDS from marijuana use.    Interval history 05/18/2023 Melody Rodriguez is here  for follow-up regarding her chronic pain.  It appears that she had robotic assisted laparoscopic bilateral salpingo-oophorectomy on 02/22/2023.  Currently she is still recovering from abdominal surgery that was completed on 05/03/2024 at [facility] that included jejunostomy, gastrectomy, esophagoenterostomy due to gastric singlet ring cell adenocarcinoma.  She is still having some pain in the area of her surgery and is having some issues with nausea at times.  She is getting liquid medications for the time being.  He is currently taking oxycodone which is helping with her pain.  She continues to take Lyrica and she says this also helps with abdominal pain.  She continues to have pain in her right arm and shoulder largely unchanged from last visit.  She continues to have limited range of motion in her right shoulder.    Pain Inventory Average Pain 8 Pain Right Now 7 My pain is constant, sharp, burning, dull, and aching  In the last 24 hours, has pain interfered with the following? General activity 8 Relation with others 7 Enjoyment of life 10 What TIME of day is your pain at its worst? morning , daytime, evening, and night Sleep (in general) Fair  Pain is worse with: walking, bending, sitting, inactivity, standing, and some activites Pain improves with: medication Relief from Meds: 2  Family History  Problem Relation Age of Onset   Hypertension Mother    Colon cancer Sister 21   Cancer Maternal Aunt        unknown type dx late 62s   Diabetes Maternal Grandmother    Breast cancer Neg Hx    Ovarian cancer Neg Hx    Endometrial cancer Neg Hx    Pancreatic cancer Neg Hx    Prostate cancer Neg Hx    Esophageal cancer Neg Hx    Stomach cancer Neg Hx    Social History   Socioeconomic History   Marital status: Single    Spouse name: Not on file   Number of children: 2   Years of education: Not on file   Highest education level: Some college, no degree  Occupational  History   Occupation: unemployed  Tobacco Use   Smoking status: Former    Current packs/day: 1.00    Average packs/day: 1 pack/day for 22.0 years (22.0 ttl pk-yrs)    Types: Cigars, Cigarettes   Smokeless tobacco: Never   Tobacco comments:    quit 07/2020  Vaping Use   Vaping status: Never Used  Substance and Sexual Activity   Alcohol use: Yes    Comment: socially   Drug use: Yes    Types: Marijuana    Comment: daily   Sexual activity: Not Currently    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  Other Topics Concern   Not on file  Social History Narrative   Not on file   Social Determinants of Health   Financial Resource Strain: High Risk (05/05/2023)   Received from [facility]   Overall Financial Resource Strain (CARDIA)    Difficulty of Paying Living Expenses: Very hard  Food Insecurity: Food Insecurity Present (05/05/2023)   Received from [facility]   Hunger Vital Sign    Worried About Running Out of Food in the Last Year: Sometimes true    Ran Out of Food in the Last Year: Sometimes true  Transportation Needs: Unmet Transportation Needs (05/05/2023)   Received from [facility] - Transportation    In the past 12 months, has lack of transportation kept you from medical appointments or from getting medications?: Yes    Lack of Transportation (Non-Medical): No  Physical Activity: Inactive (10/09/2022)   Received from [facility]   Exercise Vital Sign    Days of Exercise per Week: 5 days    Minutes of Exercise per Session: 0 min  Stress: Stress Concern Present (10/09/2022)   Received from [facility] - Occupational Stress Questionnaire    Feeling of Stress : Very much  Social Connections: Moderately Integrated (10/09/2022)   Received from [facility], [facility]   Social Network    How would you rate your social network  (family, work, friends)?: Adequate participation with social networks   Past Surgical History:  Procedure Laterality Date   BREAST BIOPSY Right 02/18/2021   Procedure: EVACUATION HEMATOMA RIGHT AXILLA;  Surgeon: Abigail Miyamoto, MD;  Location: [facility];  Service: General;  Laterality: Right;   BREAST CYST EXCISION Right    Patient does not recall (2014 or 2015)   BREAST LUMPECTOMY WITH RADIOACTIVE SEED AND SENTINEL LYMPH NODE BIOPSY Right 02/07/2021   Procedure: RIGHT BREAST LUMPECTOMY WITH RADIOACTIVE SEED AND SEED TARGETED LYMPH NODE BIOPSY AND SENTINEL LYMPH NODE BIOPSY;  Surgeon: Abigail Miyamoto, MD;  Location: [facility];  Service: General;  Laterality: Right;   CESAREAN SECTION     x2   COLONOSCOPY     IR IMAGING GUIDED PORT INSERTION  09/15/2020   IR REMOVAL TUN CV CATH W/O FL  09/15/2020   NODE DISSECTION Right 03/28/2021   Procedure: RIGHT AXILLARY LYMPH NODE DISSECTION;  Surgeon: Abigail Miyamoto, MD;  Location: [facility];  Service: General;  Laterality: Right;   PORT-A-CATH REMOVAL Left 02/07/2021   Procedure: REMOVAL PORT-A-CATH;  Surgeon: Abigail Miyamoto, MD;  Location: [facility];  Service: General;  Laterality: Left;   ROBOTIC ASSISTED BILATERAL SALPINGO OOPHERECTOMY Bilateral 02/22/2023   Procedure: XI ROBOTIC ASSISTED LAPAROSCOPIC BILATERAL SALPINGO OOPHORECTOMY;  Surgeon: Carver Fila, MD;  Location: [facility];  Service: Gynecology;  Laterality: Bilateral;   THYROIDECTOMY, PARTIAL     UPPER GASTROINTESTINAL ENDOSCOPY     Past Surgical History:  Procedure Laterality Date   BREAST BIOPSY Right 02/18/2021   Procedure: EVACUATION HEMATOMA RIGHT AXILLA;  Surgeon: Abigail Miyamoto, MD;  Location: [facility];  Service: General;  Laterality: Right;   BREAST CYST EXCISION Right    Patient does not recall (2014 or 2015)   BREAST LUMPECTOMY WITH RADIOACTIVE SEED AND SENTINEL LYMPH NODE BIOPSY  Right 02/07/2021   Procedure: RIGHT BREAST LUMPECTOMY WITH RADIOACTIVE SEED AND SEED TARGETED LYMPH NODE BIOPSY AND SENTINEL LYMPH NODE BIOPSY;  Surgeon: Abigail Miyamoto, MD;  Location: [facility];  Service: General;  Laterality: Right;   CESAREAN SECTION     x2   COLONOSCOPY     IR IMAGING GUIDED PORT INSERTION  09/15/2020   IR REMOVAL TUN CV CATH W/O FL  09/15/2020   NODE DISSECTION Right 03/28/2021   Procedure: RIGHT AXILLARY LYMPH NODE DISSECTION;  Surgeon: Abigail Miyamoto, MD;  Location: [facility];  Service: General;  Laterality: Right;   PORT-A-CATH REMOVAL Left 02/07/2021   Procedure: REMOVAL PORT-A-CATH;  Surgeon: Abigail Miyamoto, MD;  Location: [facility];  Service: General;  Laterality: Left;   ROBOTIC ASSISTED BILATERAL SALPINGO OOPHERECTOMY Bilateral 02/22/2023   Procedure: XI ROBOTIC ASSISTED LAPAROSCOPIC BILATERAL SALPINGO OOPHORECTOMY;  Surgeon: Carver Fila, MD;  Location: [facility];  Service: Gynecology;  Laterality: Bilateral;   THYROIDECTOMY, PARTIAL     UPPER GASTROINTESTINAL ENDOSCOPY     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  Opioid Risk Score:   Fall Risk Score:  `1  Depression screen [facility] 2/9     02/05/2023    9:28 AM 10/03/2022    9:35 AM 07/31/2022   10:36 AM 06/19/2022   10:46  AM 05/23/2022    9:38 AM 04/13/2022   10:11 AM 03/14/2022    9:50 AM  Depression screen PHQ 2/9  Decreased Interest 0 3 3 3  0 1 3  Down, Depressed, Hopeless 0 3 3 3  0 1 3  PHQ - 2 Score 0 6 6 6  0 2 6  Altered sleeping       3  Tired, decreased energy       3  Change in appetite       3  Feeling bad or failure about yourself        3  Trouble concentrating       3  Moving slowly or fidgety/restless       3  Suicidal thoughts       1  PHQ-9 Score       25  Difficult doing work/chores       Very difficult    Review of Systems  Gastrointestinal:  Positive for abdominal pain.  Musculoskeletal:  Positive for back pain.       Right arm pain, right shoulder pain down to right hand  All other systems reviewed and are negative.     Objective:   Physical Exam   Gen: no distress, normal appearing HEENT: oral mucosa pink and moist, NCAT Chest: normal effort, normal rate of breathing Abd: Feeding tube in place Ext: no edema Psych: appropriate, normal affect   Skin: intact Neuro: Alert and oriented, sensation intact in all 4 extremities, DTR normal and symmetric Musculoskeletal:  Strength  5/5 in LUE and B/L LE Strength at least 4/5 RUE limited by pain Limited active range of motion R shoulder-able to passively AB duct about to 45 degrees. Pain to palpation throughout RUE  Mild swelling in right upper extremity compared to left upper extremity Spurlings negative She continues to have allodynia throughout her right upper arm Reports decreased sensation around her right shoulder to light touch     MRI shoulder R IMPRESSION: 1. Mild distal infraspinatus tendinosis. No evidence of rotator cuff tear. No evidence of labral tear. 2. Faint intramuscular edema within teres minor, consistent with low-grade muscle strain. 3. Mild soft tissue edema within the right axilla compatible with history of prior axillary lymph node dissection.       Assessment & Plan:     Right upper  extremity pain. Suspicious for CPRS versus neuropathic pain related to cancer treatment, diagnosis complicated by edema due to lymph node dissection. Suspect she also has component of frozen shoulder although this by itself wouldn't explain the entirely of her diffuse pain throughout her RUE.  Prior MRI shows infraspinatus tendinitis -Duloxetine 30 mg stopped previously due to rash -Gabapentin caused sedation in the past she has stopped using amitriptyline as well -Continue meloxicam -Continue home exercise program, she continues to decline formal PT -Triple phase bone scan was negative -Continue with the current dose, she has been ordered this in liquid by oncology at [facility] -Discussed option to try medication like venlafaxine, she continues to decline any medication changes -Discussed option for sympathetic nerve block patient declines again today-unchanged 8/30 -Discussed option for TENS unit, she is not interested at this time -Patient reports she has discontinued use of any CBD products -She is currently on liquid oxycodone ordered by oncology -Discussed possible referral to Ortho surgery for possible intervention for frozen shoulder, she declines at this time -Patient not interested in any interventions at this time, currently her focus is to recover from her abdominal surgery   Mood disorder, Sleep disorder -Denies SI or HI -She had a rash with duloxetine but venlafaxine could be an option.  She is not interested in this medication

## 2023-05-23 ENCOUNTER — Other Ambulatory Visit (HOSPITAL_COMMUNITY): Payer: Self-pay

## 2023-05-24 ENCOUNTER — Ambulatory Visit: Payer: Medicaid Other

## 2023-05-28 ENCOUNTER — Ambulatory Visit: Payer: Medicaid Other

## 2023-05-31 ENCOUNTER — Other Ambulatory Visit (HOSPITAL_COMMUNITY): Payer: Self-pay

## 2023-06-01 ENCOUNTER — Encounter: Payer: Self-pay | Admitting: Internal Medicine

## 2023-06-01 ENCOUNTER — Encounter: Payer: Self-pay | Admitting: Hematology and Oncology

## 2023-06-01 ENCOUNTER — Other Ambulatory Visit (HOSPITAL_COMMUNITY): Payer: Self-pay

## 2023-06-02 ENCOUNTER — Other Ambulatory Visit (HOSPITAL_COMMUNITY): Payer: Self-pay

## 2023-06-02 MED ORDER — ACETAMINOPHEN 160 MG/5ML PO SUSP: 640.0000 mg | Freq: Four times a day (QID) | ORAL | 0 refills | Status: DC | PRN

## 2023-06-02 MED ORDER — ONDANSETRON HCL 4 MG/5ML PO SOLN
4.0000 mg | Freq: Three times a day (TID) | ORAL | 0 refills | Status: DC | PRN
  Filled 2023-06-02: qty 50, 4d supply, fill #0

## 2023-06-02 MED ORDER — PREGABALIN 20 MG/ML PO SOLN
200.0000 mg | Freq: Two times a day (BID) | ORAL | 0 refills | Status: DC
  Filled 2023-06-02: qty 473, 24d supply, fill #0

## 2023-06-04 ENCOUNTER — Other Ambulatory Visit (HOSPITAL_COMMUNITY): Payer: Self-pay

## 2023-06-04 MED ORDER — PANTOPRAZOLE SODIUM 40 MG PO TBEC
DELAYED_RELEASE_TABLET | ORAL | 11 refills | Status: DC
  Filled 2023-06-04: qty 30, 30d supply, fill #0

## 2023-06-06 ENCOUNTER — Other Ambulatory Visit (HOSPITAL_COMMUNITY): Payer: Self-pay

## 2023-06-06 MED ORDER — ONDANSETRON HCL 4 MG/5ML PO SOLN
ORAL | 0 refills | Status: DC
  Filled 2023-06-06: qty 50, 3d supply, fill #0

## 2023-06-06 MED ORDER — PROMETHAZINE HCL 12.5 MG PO TABS
ORAL_TABLET | ORAL | 0 refills | Status: DC
  Filled 2023-06-06: qty 30, 7d supply, fill #0

## 2023-06-06 MED ORDER — SUCRALFATE 1 GM/10ML PO SUSP
ORAL | 11 refills | Status: AC
  Filled 2023-06-06: qty 1200, 30d supply, fill #0
  Filled 2023-07-24: qty 1200, 30d supply, fill #1
  Filled 2023-08-23: qty 1200, 30d supply, fill #2
  Filled 2023-09-25: qty 1200, 30d supply, fill #3
  Filled 2023-11-28 (×3): qty 1200, 30d supply, fill #4
  Filled 2023-12-28: qty 1200, 30d supply, fill #5
  Filled 2024-01-28: qty 1200, 30d supply, fill #6
  Filled 2024-02-26: qty 1200, 30d supply, fill #7
  Filled 2024-03-26: qty 1200, 30d supply, fill #8
  Filled 2024-04-25: qty 1200, 30d supply, fill #9
  Filled 2024-05-26: qty 1200, 30d supply, fill #10

## 2023-06-06 NOTE — Telephone Encounter (Signed)
Called pt- she states she has spoken with the MD and has no further needs at this time.

## 2023-06-06 NOTE — Telephone Encounter (Signed)
Could someone call her back? I don't see any notes that she has been called yet. Lorayne Marek, RN

## 2023-06-08 ENCOUNTER — Emergency Department (HOSPITAL_COMMUNITY)
Admission: EM | Admit: 2023-06-08 | Discharge: 2023-06-09 | Disposition: A | Discharge status: Home / Self Care | Payer: Medicaid Other | Attending: Emergency Medicine | Admitting: Emergency Medicine

## 2023-06-08 ENCOUNTER — Other Ambulatory Visit: Payer: Self-pay

## 2023-06-08 ENCOUNTER — Emergency Department (HOSPITAL_COMMUNITY): Payer: Medicaid Other

## 2023-06-08 ENCOUNTER — Encounter (HOSPITAL_COMMUNITY): Payer: Self-pay

## 2023-06-08 DIAGNOSIS — Z91040 Latex allergy status: Secondary | ICD-10-CM | POA: Diagnosis not present

## 2023-06-08 DIAGNOSIS — R112 Nausea with vomiting, unspecified: Secondary | ICD-10-CM | POA: Diagnosis present

## 2023-06-08 DIAGNOSIS — R109 Unspecified abdominal pain: Secondary | ICD-10-CM | POA: Insufficient documentation

## 2023-06-08 LAB — HCG, SERUM, QUALITATIVE: Preg, Serum: NEGATIVE

## 2023-06-08 LAB — COMPREHENSIVE METABOLIC PANEL
ALT: 77 U/L — ABNORMAL HIGH (ref 0–44)
AST: 31 U/L (ref 15–41)
Albumin: 4.9 g/dL (ref 3.5–5.0)
Alkaline Phosphatase: 84 U/L (ref 38–126)
Anion gap: 11 (ref 5–15)
BUN: 36 mg/dL — ABNORMAL HIGH (ref 6–20)
CO2: 31 mmol/L (ref 22–32)
Calcium: 9.8 mg/dL (ref 8.9–10.3)
Chloride: 100 mmol/L (ref 98–111)
Creatinine, Ser: 0.87 mg/dL (ref 0.44–1.00)
GFR, Estimated: >60 mL/min (ref >60)
Glucose, Bld: 144 mg/dL — ABNORMAL HIGH (ref 70–99)
Potassium: 3.6 mmol/L (ref 3.5–5.1)
Sodium: 142 mmol/L (ref 135–145)
Total Bilirubin: 0.8 mg/dL (ref 0.3–1.2)
Total Protein: 8.8 g/dL — ABNORMAL HIGH (ref 6.5–8.1)

## 2023-06-08 LAB — CBC WITH DIFFERENTIAL/PLATELET
Abs Immature Granulocytes: 0.05 10*3/uL (ref 0.00–0.07)
Basophils Absolute: 0 10*3/uL (ref 0.0–0.1)
Basophils Relative: 0 %
Eosinophils Absolute: 0.1 10*3/uL (ref 0.0–0.5)
Eosinophils Relative: 0 %
HCT: 47.4 % — ABNORMAL HIGH (ref 36.0–46.0)
Hemoglobin: 15.4 g/dL — ABNORMAL HIGH (ref 12.0–15.0)
Immature Granulocytes: 0 %
Lymphocytes Relative: 16 %
Lymphs Abs: 1.8 10*3/uL (ref 0.7–4.0)
MCH: 30.1 pg (ref 26.0–34.0)
MCHC: 32.5 g/dL (ref 30.0–36.0)
MCV: 92.8 fL (ref 80.0–100.0)
Monocytes Absolute: 0.6 10*3/uL (ref 0.1–1.0)
Monocytes Relative: 5 %
Neutro Abs: 8.7 10*3/uL — ABNORMAL HIGH (ref 1.7–7.7)
Neutrophils Relative %: 79 %
Platelets: 271 10*3/uL (ref 150–400)
RBC: 5.11 MIL/uL (ref 3.87–5.11)
RDW: 12.1 % (ref 11.5–15.5)
WBC: 11.2 10*3/uL — ABNORMAL HIGH (ref 4.0–10.5)
nRBC: 0 % (ref 0.0–0.2)

## 2023-06-08 LAB — LIPASE, BLOOD: Lipase: 33 U/L (ref 11–51)

## 2023-06-08 MED ORDER — IOHEXOL 300 MG/ML  SOLN
100.0000 mL | Freq: Once | INTRAMUSCULAR | Status: AC | PRN
  Administered 2023-06-08: 100 mL via INTRAVENOUS

## 2023-06-08 MED ORDER — SODIUM CHLORIDE 0.9 % IV SOLN
12.5000 mg | Freq: Once | INTRAVENOUS | Status: AC
  Administered 2023-06-08: 12.5 mg via INTRAVENOUS
  Filled 2023-06-08: qty 12.5

## 2023-06-08 MED ORDER — HYDROMORPHONE HCL 1 MG/ML IJ SOLN
1.0000 mg | Freq: Once | INTRAMUSCULAR | Status: AC
  Administered 2023-06-08: 1 mg via INTRAVENOUS
  Filled 2023-06-08: qty 1

## 2023-06-08 MED ORDER — LORAZEPAM 2 MG/ML IJ SOLN
1.0000 mg | Freq: Once | INTRAMUSCULAR | Status: AC
  Administered 2023-06-08: 1 mg via INTRAVENOUS
  Filled 2023-06-08: qty 1

## 2023-06-08 MED ORDER — SODIUM CHLORIDE 0.9 % IV BOLUS
1000.0000 mL | Freq: Once | INTRAVENOUS | Status: AC
  Administered 2023-06-08: 1000 mL via INTRAVENOUS

## 2023-06-08 MED ORDER — ONDANSETRON HCL 4 MG/2ML IJ SOLN
4.0000 mg | Freq: Once | INTRAMUSCULAR | Status: AC
  Administered 2023-06-08: 4 mg via INTRAVENOUS
  Filled 2023-06-08: qty 2

## 2023-06-08 MED ORDER — ONDANSETRON HCL 4 MG/2ML IJ SOLN: 4.0000 mg | Freq: Once | INTRAMUSCULAR | Status: DC

## 2023-06-08 NOTE — ED Notes (Signed)
3x attempts to place IV.

## 2023-06-08 NOTE — ED Provider Notes (Signed)
[place] EMERGENCY DEPARTMENT AT [facility] Provider Note   CSN: [id number] Arrival date & time: 06/08/23  1411     History  Chief Complaint  Patient presents with   Emesis    Melody Rodriguez is a 48 y.o. female.  Patient here with nausea vomiting for the last week or so.  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  Follows at [facility].  She was using a feeding tube.  But still having nausea vomiting.  She just had a hospital admission at [facility] recently.  She was not feeling well even at time of discharge then.  She denies any fevers or chills.  Denies any pain urination.  She is having some loose stools.  She is having intermittent abdominal pain.  Denies any weakness numbness tingling.  The history is provided by the patient.       Home Medications Prior to Admission medications   Medication Sig Start Date End Date Taking? Authorizing Provider  anastrozole (ARIMIDEX) 1 MG tablet Take 1 tablet (1 mg total) by mouth daily. 03/27/22  Yes Serena Croissant, MD  enoxaparin (LOVENOX) 40 MG/0.4ML injection Inject into the skin.   Yes [provider]  famotidine (PEPCID) 40 MG tablet Take by mouth. 08/07/22  Yes [provider]  hydrOXYzine (ATARAX) 10 MG/5ML syrup Take 5 mLs (10 mg dose) by mouth 4 (four) times a day as needed 05/16/23  Yes   Multiple Vitamin (MULTI-VITAMIN) tablet Take by mouth. 05/10/23  Yes [provider]  ondansetron (ZOFRAN) 4 MG/5ML solution Take 5 mLs (4 mg total) by mouth every 8 (eight) hours as needed for Nausea for up to 7 days 06/06/23  Yes   Pregabalin 20 MG/ML SOLN Take 10 mLs (200 mg total) by mouth 2 (two) times daily 06/01/23  Yes   promethazine (PHENERGAN) 12.5 MG tablet Take 1 tablet (12.5 mg total) by mouth every 6 (six) hours as needed for Nausea for up to 7 days 06/06/23  Yes   Semaglutide, 2 MG/DOSE, (OZEMPIC, 2 MG/DOSE,) 8 MG/3ML SOPN Inject 2 mg into the skin once a week. 04/10/23  Yes   sucralfate (CARAFATE) 1  GM/10ML suspension Take 10 mLs (1 g total) by mouth 4 (four) times daily before meals and nightly 06/06/23  Yes   ACCU-CHEK GUIDE test strip USE TO MONITOR BLOOD GLUCOSE 3 TIME(S) DAILY 10/11/21   [provider]  Glucose Blood (BLOOD GLUCOSE TEST STRIPS) STRP Use as directed 05/17/23     oxyCODONE (ROXICODONE) 5 MG/5ML solution Take 5 mLs (5 mg total) by mouth every 4 (four) hours as needed for pain for up to 16 days. 05/15/23     pantoprazole (PROTONIX) 40 MG tablet Take 1 tablet (40 mg total) by mouth once daily 06/04/23     atorvastatin (LIPITOR) 10 MG tablet Take 1 tablet (10 mg total) by mouth daily. 09/13/22 05/16/23        Allergies    Duloxetine, Keflex [cephalexin], Tramadol, Latex, and Naproxen    Review of Systems   Review of Systems  Physical Exam Updated Vital Signs BP 128/75 (BP Location: Right Arm)   Pulse 87   Temp 98 F (36.7 C) (Oral)   Resp 18   Ht 5\' 4"  (1.626 m)   Wt 78 kg   SpO2 100%   BMI 29.52 kg/m  Physical Exam Vitals and nursing note reviewed.  Constitutional:      General: She is not in acute distress.  Appearance: She is well-developed. She is ill-appearing.  HENT:     Head: Normocephalic and atraumatic.  Eyes:     Extraocular Movements: Extraocular movements intact.     Conjunctiva/sclera: Conjunctivae normal.     Pupils: Pupils are equal, round, and reactive to light.  Cardiovascular:     Rate and Rhythm: Normal rate and regular rhythm.     Pulses: Normal pulses.     Heart sounds: Normal heart sounds. No murmur heard. Pulmonary:     Effort: Pulmonary effort is normal. No respiratory distress.     Breath sounds: Normal breath sounds.  Abdominal:     Palpations: Abdomen is soft.     Tenderness: There is abdominal tenderness.  Musculoskeletal:        General: No swelling.     Cervical back: Normal range of motion and neck supple.  Skin:    General: Skin is warm and dry.     Capillary Refill: Capillary refill takes less than 2  seconds.  Neurological:     General: No focal deficit present.     Mental Status: She is alert.  Psychiatric:        Mood and Affect: Mood normal.     ED Results / Procedures / Treatments   Labs (all labs ordered are listed, but only abnormal results are displayed) Labs Reviewed  COMPREHENSIVE METABOLIC PANEL - Abnormal; Notable for the following components:      Result Value   Glucose, Bld 144 (*)    BUN 36 (*)    Total Protein 8.8 (*)    ALT 77 (*)    All other components within normal limits  CBC WITH DIFFERENTIAL/PLATELET - Abnormal; Notable for the following components:   WBC 11.2 (*)    Hemoglobin 15.4 (*)    HCT 47.4 (*)    Neutro Abs 8.7 (*)    All other components within normal limits  LIPASE, BLOOD  HCG, SERUM, QUALITATIVE  URINALYSIS, ROUTINE W REFLEX MICROSCOPIC    EKG EKG Interpretation Date/Time:  Friday June 08 2023 20:07:49 EDT Ventricular Rate:  91 PR Interval:  156 QRS Duration:  110 QT Interval:  378 QTC Calculation: 466 R Axis:   -5  Text Interpretation: Sinus rhythm Abnormal R-wave progression, early transition s Confirmed by Virgina Norfolk [phone]) on 06/08/2023 8:09:40 PM  Radiology No results found.  Procedures Procedures    Medications Ordered in ED Medications  ondansetron (ZOFRAN) injection 4 mg (4 mg Intravenous Patient Refused/Not Given 06/08/23 2143)  LORazepam (ATIVAN) injection 1 mg (has no administration in time range)  sodium chloride 0.9 % bolus 1,000 mL (0 mLs Intravenous Stopped 06/08/23 2143)  HYDROmorphone (DILAUDID) injection 1 mg (1 mg Intravenous Given 06/08/23 1922)  promethazine (PHENERGAN) 12.5 mg in sodium chloride 0.9 % 50 mL IVPB (0 mg Intravenous Stopped 06/08/23 2143)  iohexol (OMNIPAQUE) 300 MG/ML solution 100 mL (100 mLs Intravenous Contrast Given 06/08/23 2037)    ED Course/ Medical Decision Making/ A&P                                 Medical Decision Making Amount and/or Complexity of Data  Reviewed Labs: ordered.  Risk Prescription drug management.   Melody Rodriguez is here with nausea and vomiting.  Unremarkable vitals.  No fever.  History of stomach cancer status post stomach surgery a few weeks ago at [facility].  She is undergoing any chemotherapy or radiation.  She has been taking antiemetics at home with no improvement.  She had hospital admission shortly after surgery at [facility] where I can see she had extensive workup and it did not appear to be any postop complication.  She is unable to tolerate any p.o. right now, be able to get a p.o. contrasted study.  However she did get that study only at [facility] and there did not appear to be any anastomosis leak or any other perforation.  She is requesting to be transferred to [facility] if able.  Overall differential diagnosis likely ongoing postop pain/inability to tolerate p.o.  Will get a CT scan and check basic labs and give her IV fluids IV Phenergan and IV Dilaudid and reevaluate.  I will try to reach out to the as well.  Per my review and interpretation of labs no significant anemia or electrolyte abnormality or kidney injury or leukocytosis.  She is still pretty symptomatic.  CT scan has been done.  I have already talked with the about transferring for symptomatic support.  I talked with Dr. Neil Crouch with [facility] hospitalist team who knows the patient fairly well.  Understands a lot of the social situation and difficulty with her symptoms.  They are aware that CT scan is pending and I will call back if there is any major abnormality the CT scan.  I have asked for these images to be pushed over in the PACS system as well.  She is hemodynamically stable.  Will provide Ativan next for some symptomatic support.  She is awaiting transfer to [facility] for further care.  CT scan per radiology report with no acute findings.  Could be a mild colitis versus nondistention.  There is no free fluid or free air.  There does not appear to be any complications with her feeding  tube.  Overall no acute findings otherwise.  She will be transferred to [facility].  Awaiting transfer.  This chart was dictated using voice recognition software.  Despite best efforts to proofread,  errors can occur which can change the documentation meaning.         Final Clinical Impression(s) / ED Diagnoses Final diagnoses:  Nausea and vomiting, unspecified vomiting type  Abdominal pain, unspecified abdominal location    Rx / DC Orders ED Discharge Orders     None         Virgina Norfolk, DO 06/08/23 2253

## 2023-06-08 NOTE — ED Triage Notes (Addendum)
Patient had her stomach removed due to cancer 5 weeks ago. Has been vomiting since Thursday. No chemo/radiation right now. Generalized abdominal pain. Patient wants to be transported to [facility]

## 2023-06-08 NOTE — ED Provider Triage Note (Signed)
Emergency Medicine Provider Triage Evaluation Note  Melody Rodriguez , a 47 y.o. female  was evaluated in triage.  Pt complains of vomiting and abdominal pain and vomiting.  Review of Systems  Positive:  Negative:   Physical Exam  BP (!) 129/95   Pulse (!) 105   Temp 98.8 F (37.1 C) (Oral)   Resp 18   Ht 5\' 4"  (1.626 m)   Wt 78 kg   SpO2 100%   BMI 29.52 kg/m  Gen:   Awake, no distress   Resp:  Normal effort  MSK:   Moves extremities without difficulty  Other:    Medical Decision Making  Medically screening exam initiated at 2:58 PM.  Appropriate orders placed.  Melody Rodriguez was informed that the remainder of the evaluation will be completed by another provider, this initial triage assessment does not replace that evaluation, and the importance of remaining in the ED until their evaluation is complete.  Concerned for bloody vomiting x8 days. Patient stating that they had stomach removed d/t cancer 5 weeks ago. Has not had chemo/radiation recently. Vomiting in triag. Also with generalized abdominal pain. Surgery at [facility] - states she wants to be transported to [place]. Patient released from hospital last week.    Melody Rodriguez, [place] 06/08/23 [phone]

## 2023-06-09 ENCOUNTER — Other Ambulatory Visit (HOSPITAL_COMMUNITY): Payer: Self-pay

## 2023-06-09 DIAGNOSIS — R112 Nausea with vomiting, unspecified: Secondary | ICD-10-CM | POA: Diagnosis present

## 2023-06-09 DIAGNOSIS — R109 Unspecified abdominal pain: Secondary | ICD-10-CM | POA: Diagnosis not present

## 2023-06-09 DIAGNOSIS — Z91040 Latex allergy status: Secondary | ICD-10-CM | POA: Diagnosis not present

## 2023-06-09 LAB — URINALYSIS, ROUTINE W REFLEX MICROSCOPIC
Bilirubin Urine: NEGATIVE
Glucose, UA: NEGATIVE mg/dL
Hgb urine dipstick: NEGATIVE
Ketones, ur: NEGATIVE mg/dL
Nitrite: NEGATIVE
Protein, ur: NEGATIVE mg/dL
Specific Gravity, Urine: 1.02 (ref 1.005–1.030)
pH: 5 (ref 5.0–8.0)

## 2023-06-09 MED ORDER — HYDROMORPHONE HCL 1 MG/ML IJ SOLN
1.0000 mg | INTRAMUSCULAR | Status: DC | PRN
  Administered 2023-06-09 (×2): 1 mg via INTRAVENOUS
  Filled 2023-06-09 (×2): qty 1

## 2023-06-09 MED ORDER — ONDANSETRON HCL 4 MG/2ML IJ SOLN
4.0000 mg | Freq: Three times a day (TID) | INTRAMUSCULAR | Status: DC | PRN
  Administered 2023-06-09: 4 mg via INTRAVENOUS
  Filled 2023-06-09: qty 2

## 2023-06-09 NOTE — ED Notes (Signed)
Pt has a bed at [facility]. Report given to Jae Dire, Charity fundraiser.

## 2023-06-09 NOTE — ED Notes (Signed)
[facility] contacted. Transported request placed.

## 2023-06-09 NOTE — ED Notes (Signed)
Nurse spoke to [facility] [phone], Shanda Bumps stated they would not be able to make the transfer.

## 2023-06-09 NOTE — ED Notes (Signed)
Nurse called [phone] [facility] transport. Was advised by rep that since pt is not on any drips or cardiac monitoring/not critical pt would need to transported by [facility] [phone]  or [facility] [phone].

## 2023-06-09 NOTE — ED Notes (Signed)
Nurse called [facility] at [phone]. Nurse got error message.

## 2023-06-18 ENCOUNTER — Other Ambulatory Visit (HOSPITAL_COMMUNITY): Payer: Self-pay

## 2023-06-29 ENCOUNTER — Other Ambulatory Visit (HOSPITAL_COMMUNITY): Payer: Self-pay

## 2023-06-29 MED ORDER — OZEMPIC (0.25 OR 0.5 MG/DOSE) 2 MG/3ML ~~LOC~~ SOPN
0.2500 mg | PEN_INJECTOR | SUBCUTANEOUS | 3 refills | Status: DC
  Filled 2023-06-29: qty 3, 28d supply, fill #0
  Filled 2023-07-24: qty 3, 28d supply, fill #1
  Filled 2023-08-23: qty 3, 28d supply, fill #2

## 2023-06-30 ENCOUNTER — Other Ambulatory Visit (HOSPITAL_COMMUNITY): Payer: Self-pay

## 2023-07-06 ENCOUNTER — Other Ambulatory Visit (HOSPITAL_COMMUNITY): Payer: Self-pay

## 2023-07-11 ENCOUNTER — Other Ambulatory Visit (HOSPITAL_COMMUNITY): Payer: Self-pay

## 2023-07-11 MED ORDER — PANTOPRAZOLE SODIUM 40 MG PO TBEC
40.0000 mg | DELAYED_RELEASE_TABLET | Freq: Every day | ORAL | 11 refills | Status: DC
  Filled 2023-07-11: qty 30, 30d supply, fill #0
  Filled 2023-07-24 – 2023-08-11 (×2): qty 30, 30d supply, fill #1
  Filled 2023-09-10: qty 30, 30d supply, fill #2
  Filled 2023-10-14: qty 30, 30d supply, fill #3
  Filled 2023-11-14: qty 30, 30d supply, fill #4
  Filled 2023-12-08: qty 30, 30d supply, fill #5
  Filled 2024-01-07: qty 30, 30d supply, fill #6
  Filled 2024-02-06: qty 30, 30d supply, fill #7
  Filled 2024-03-03: qty 30, 30d supply, fill #8
  Filled 2024-03-26 – 2024-04-24 (×5): qty 30, 30d supply, fill #9
  Filled 2024-05-21 – 2024-05-27 (×2): qty 30, 30d supply, fill #10
  Filled 2024-06-18 – 2024-06-24 (×2): qty 30, 30d supply, fill #11

## 2023-07-11 MED ORDER — BLOOD GLUCOSE TEST VI STRP
ORAL_STRIP | 5 refills | Status: AC
  Filled 2023-07-11: qty 50, 30d supply, fill #0
  Filled 2023-08-14 – 2023-09-25 (×4): qty 50, 30d supply, fill #1
  Filled 2023-11-03: qty 100, 90d supply, fill #2
  Filled 2024-01-28: qty 100, 90d supply, fill #3
  Filled 2024-04-28: qty 100, 90d supply, fill #4

## 2023-07-11 MED ORDER — ONDANSETRON HCL 4 MG/5ML PO SOLN
ORAL | 0 refills | Status: DC
  Filled 2023-07-11: qty 400, 26d supply, fill #0

## 2023-07-11 MED ORDER — HYDROMORPHONE HCL 1 MG/ML PO LIQD
ORAL | 0 refills | Status: DC
  Filled 2023-07-11: qty 20, 5d supply, fill #0
  Filled 2023-09-10: qty 9, 1d supply, fill #0
  Filled 2023-09-11: qty 11, 2d supply, fill #0

## 2023-07-12 ENCOUNTER — Other Ambulatory Visit (HOSPITAL_COMMUNITY): Payer: Self-pay

## 2023-07-13 ENCOUNTER — Other Ambulatory Visit (HOSPITAL_COMMUNITY): Payer: Self-pay

## 2023-07-16 ENCOUNTER — Other Ambulatory Visit (HOSPITAL_COMMUNITY): Payer: Self-pay

## 2023-07-16 MED ORDER — HYDROMORPHONE HCL 1 MG/ML PO LIQD
ORAL | 0 refills | Status: DC
  Filled 2023-07-16: qty 20, 3d supply, fill #0

## 2023-07-16 MED ORDER — ONDANSETRON HCL 4 MG/5ML PO SOLN
ORAL | 0 refills | Status: DC
  Filled 2023-07-16 – 2023-08-11 (×2): qty 400, 27d supply, fill #0

## 2023-07-17 ENCOUNTER — Other Ambulatory Visit (HOSPITAL_COMMUNITY): Payer: Self-pay

## 2023-07-18 ENCOUNTER — Other Ambulatory Visit (HOSPITAL_COMMUNITY): Payer: Self-pay

## 2023-07-23 ENCOUNTER — Other Ambulatory Visit (HOSPITAL_COMMUNITY): Payer: Self-pay

## 2023-07-24 ENCOUNTER — Other Ambulatory Visit: Payer: Self-pay

## 2023-07-24 ENCOUNTER — Other Ambulatory Visit (HOSPITAL_COMMUNITY): Payer: Self-pay

## 2023-07-24 MED ORDER — METHOCARBAMOL 500 MG PO TABS
500.0000 mg | ORAL_TABLET | Freq: Three times a day (TID) | ORAL | 0 refills | Status: DC
  Filled 2023-07-24: qty 90, 30d supply, fill #0

## 2023-07-24 MED ORDER — PREGABALIN 200 MG PO CAPS
200.0000 mg | ORAL_CAPSULE | Freq: Two times a day (BID) | ORAL | 11 refills | Status: DC
  Filled 2023-07-24: qty 60, 30d supply, fill #0

## 2023-07-25 ENCOUNTER — Other Ambulatory Visit (HOSPITAL_COMMUNITY): Payer: Self-pay

## 2023-08-07 ENCOUNTER — Other Ambulatory Visit (HOSPITAL_COMMUNITY): Payer: Self-pay

## 2023-08-08 NOTE — Telephone Encounter (Signed)
Telephone call  

## 2023-08-11 ENCOUNTER — Other Ambulatory Visit (HOSPITAL_COMMUNITY): Payer: Self-pay

## 2023-08-12 ENCOUNTER — Other Ambulatory Visit: Payer: Self-pay

## 2023-08-13 ENCOUNTER — Other Ambulatory Visit: Payer: Self-pay

## 2023-08-13 ENCOUNTER — Other Ambulatory Visit (HOSPITAL_COMMUNITY): Payer: Self-pay

## 2023-08-14 ENCOUNTER — Other Ambulatory Visit (HOSPITAL_COMMUNITY): Payer: Self-pay

## 2023-08-15 ENCOUNTER — Other Ambulatory Visit: Payer: Self-pay

## 2023-08-17 ENCOUNTER — Other Ambulatory Visit (HOSPITAL_COMMUNITY): Payer: Self-pay

## 2023-08-20 ENCOUNTER — Other Ambulatory Visit (HOSPITAL_COMMUNITY): Payer: Self-pay

## 2023-08-22 ENCOUNTER — Encounter: Payer: Self-pay | Admitting: Hematology and Oncology

## 2023-08-23 ENCOUNTER — Encounter: Payer: Self-pay | Admitting: Gynecologic Oncology

## 2023-08-23 ENCOUNTER — Other Ambulatory Visit (HOSPITAL_COMMUNITY): Payer: Self-pay

## 2023-08-23 ENCOUNTER — Other Ambulatory Visit: Payer: Self-pay | Admitting: *Deleted

## 2023-08-23 ENCOUNTER — Telehealth: Payer: Self-pay | Admitting: *Deleted

## 2023-08-23 ENCOUNTER — Other Ambulatory Visit: Payer: Self-pay

## 2023-08-23 DIAGNOSIS — Z170 Estrogen receptor positive status [ER+]: Secondary | ICD-10-CM

## 2023-08-23 MED ORDER — VENLAFAXINE HCL ER 75 MG PO CP24
75.0000 mg | ORAL_CAPSULE | Freq: Every day | ORAL | 2 refills | Status: DC
  Filled 2023-08-23: qty 30, 30d supply, fill #0

## 2023-08-23 NOTE — Telephone Encounter (Signed)
Spoke with Melody Rodriguez in regards to her question she left for [name] about scheduling appt. For pap smear. Relayed message from [name] that patient last saw [name] 08/2021 and her pap will be due in 08/2024.  Patient states she did see a provider on [facility], but she would prefer to see [name] again. Pt requested the office send a MyChart Message with [name] office address and phone number. Pt thanked the office for calling and message was sent to patient via MyChart with [facility] for [facility] in [place] on [address] and office # [phone].

## 2023-08-23 NOTE — Telephone Encounter (Signed)
Melody Rodriguez - please call the patient and see if she has seen an OBGYN in town. We need to get her set up for routine gyn care. If she remembers where she had her last pap (or gyn care) in [name], we can work on requesting the records. Thank you

## 2023-08-24 ENCOUNTER — Other Ambulatory Visit (HOSPITAL_COMMUNITY): Payer: Self-pay

## 2023-09-11 ENCOUNTER — Other Ambulatory Visit: Payer: Self-pay

## 2023-09-11 ENCOUNTER — Other Ambulatory Visit (HOSPITAL_COMMUNITY): Payer: Self-pay

## 2023-09-13 ENCOUNTER — Other Ambulatory Visit (HOSPITAL_COMMUNITY): Payer: Self-pay

## 2023-09-13 MED ORDER — GLIPIZIDE 5 MG PO TABS
5.0000 mg | ORAL_TABLET | Freq: Every day | ORAL | 0 refills | Status: DC
  Filled 2023-09-13: qty 30, 30d supply, fill #0

## 2023-09-14 ENCOUNTER — Telehealth: Payer: Self-pay | Admitting: *Deleted

## 2023-09-14 ENCOUNTER — Other Ambulatory Visit (HOSPITAL_COMMUNITY): Payer: Self-pay

## 2023-09-14 NOTE — Telephone Encounter (Signed)
Received call from Angelica Ran PA with [facility] [phone]) stating pt underwent liver bx and has liver mets with IHC and FISH pending requesting pt to be seen by MD for further evaluation.  MD out of office and appt scheduled for when MD returns.

## 2023-09-18 ENCOUNTER — Other Ambulatory Visit (HOSPITAL_COMMUNITY): Payer: Self-pay

## 2023-09-21 ENCOUNTER — Encounter: Payer: Self-pay | Admitting: Licensed Clinical Social Worker

## 2023-09-21 NOTE — Progress Notes (Signed)
 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 Pt will be seeing Dr. Gudena on Monday. CSW informed pt of requirements for funds and options for applying to [employer] once she has met with MD and has a treatment plan.  [employer] requires pts to have been working at time of dx and pt is not currently working and is awaiting disability.  CSW advised to update her disability determination case worker regarding new diagnosis.   CSW unavailable during pt's appt time Monday, but will follow-up by phone on Tuesday regarding next steps for applying to foundations.    Camara Renstrom E Kjirsten Bloodgood, LCSW Clinical Social Worker  Cancer Center    Patient is participating in a Managed Medicaid Plan:  Yes

## 2023-09-24 ENCOUNTER — Telehealth: Payer: Self-pay | Admitting: *Deleted

## 2023-09-24 ENCOUNTER — Inpatient Hospital Stay: Payer: Medicaid Other | Attending: Hematology and Oncology | Admitting: Hematology and Oncology

## 2023-09-24 VITALS — BP 138/82 | HR 84 | Temp 98.2°F | Resp 18 | Ht 64.0 in | Wt 167.6 lb

## 2023-09-24 DIAGNOSIS — Z170 Estrogen receptor positive status [ER+]: Secondary | ICD-10-CM

## 2023-09-24 DIAGNOSIS — C773 Secondary and unspecified malignant neoplasm of axilla and upper limb lymph nodes: Secondary | ICD-10-CM | POA: Insufficient documentation

## 2023-09-24 DIAGNOSIS — Z79811 Long term (current) use of aromatase inhibitors: Secondary | ICD-10-CM | POA: Insufficient documentation

## 2023-09-24 DIAGNOSIS — C50411 Malignant neoplasm of upper-outer quadrant of right female breast: Secondary | ICD-10-CM | POA: Diagnosis not present

## 2023-09-24 DIAGNOSIS — C787 Secondary malignant neoplasm of liver and intrahepatic bile duct: Secondary | ICD-10-CM | POA: Diagnosis not present

## 2023-09-24 DIAGNOSIS — E860 Dehydration: Secondary | ICD-10-CM | POA: Insufficient documentation

## 2023-09-24 NOTE — Assessment & Plan Note (Signed)
 07/28/2020:Patient palpated a right breast mass for 1-2 years. Mammogram showed a 2.2cm mass at the 11 o'clock position with surrounding calcifications, 6.4cm in total extent, and up to 5 abnormal right axillary lymph nodes. 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   Treatment plan: 1. Neoadjuvant chemotherapy (MammaPrint test High Risk): AC foll by Taxol  completed 01/11/21 2. 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 Adjuvant radiation therapy 05/11/2021-07/04/2021 4. Follow-up adjuvant antiestrogen therapy along with abemaciclib  (patient has total of 4 lymph nodes positive), but she declined because of concern for toxicities URCC 16070: Treatment of refractory nausea 5. ALND 03/28/21: 3/7 LN positive -------------------------------------------------------------------------------------------------------------------------------  Current treatment: Zoladex  plus letrozole  (patient did not want to start Verzenio  because of concern for toxicities).  Zoladex  discontinued status post bilateral salpingo-oophorectomy on 02/22/2023   Patient stopped taking letrozole  until the stomach cancer surgery happens. 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  (CTNNA1 gene mutation) Postop complications: Weight loss 131 pounds, nausea and vomiting   Bone Density: 08/31/21: T Score 1.1 (Normal) Mammograms: 11/13/2022: Benign, density Cat B CT CAP 09/07/2023: Increase in size of the liver lesion 3.9 cm (used to be 1.8 cm), possibly new lesion 0.6 cm  Biopsy at  [facility]: Consistent with metastatic breast cancer ER 92%, PR 98%, HER2 0  Discussion: I discussed with the patient that she has multiple ongoing issues which are challenging.  For the metastatic breast cancer, we would generally recommend systemic treatment with Verzinio along with aromatase inhibitor therapy.  However given her profound gastrointestinal complications, I commended starting at least on antiestrogen therapy.

## 2023-09-24 NOTE — Progress Notes (Signed)
 Patient Care Team: Jackson, Kerra J, PA-C as PCP - General (Physician Assistant) Vernetta Berg, MD as Consulting Physician (General Surgery) Odean Potts, MD as Consulting Physician (Hematology and Oncology) Shannon Agent, MD as Consulting Physician (Radiation Oncology)  DIAGNOSIS:  Encounter Diagnosis  Name Primary?   Malignant neoplasm of upper-outer quadrant of right breast in female, estrogen receptor positive (HCC) Yes    SUMMARY OF ONCOLOGIC HISTORY: Oncology History  Malignant neoplasm of upper-outer quadrant of right breast in female, estrogen receptor positive (HCC)  07/28/2020 Initial Diagnosis   Patient palpated a right breast mass for 1-2 years. Mammogram showed a 2.2cm mass at the 11 o'clock position with surrounding calcifications, 6.4cm in total extent, and up to 5 abnormal right axillary lymph nodes. 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    08/05/2020 Miscellaneous   MammaPrint: High risk luminal type B   08/11/2020 Genetic Testing   Negative genetic testing: no pathogenic variants detected in [facility] Breast Cancer STAT Panel or Common Hereditary Cancers panel. The report dates are August 11, 2020 and August 19, 2020, respectively. Two variants of uncertain signficance were detected - one in the CTNNA1 gene called c.86del and the second in the MLH1 gene called c.808A>G.   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 The change in variant classification was made as a result of re-review of the evidence in light of new variant interpretation guidelines and/or new information.   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 The amended report date is February 16, 2022.   The STAT Breast cancer panel offered by [facility] includes sequencing and rearrangement analysis for  the following 9 genes:  ATM, BRCA1, BRCA2, CDH1, CHEK2, PALB2, PTEN, STK11 and TP53.  The Common Hereditary Cancers Panel offered by [facility] includes sequencing and/or deletion duplication testing of the following 48 genes: APC, ATM, AXIN2, BARD1, BMPR1A, BRCA1, BRCA2, BRIP1, CDH1, CDK4, CDKN2A (p14ARF), CDKN2A (p16INK4a), CHEK2, CTNNA1, DICER1, EPCAM (Deletion/duplication testing only), GREM1 (promoter region deletion/duplication testing only), KIT, MEN1, MLH1, MSH2, MSH3, MSH6, MUTYH, NBN, NF1, NTHL1, PALB2, PDGFRA, PMS2, POLD1, POLE, PTEN, RAD50, RAD51C, RAD51D, RNF43, SDHB, SDHC, SDHD, SMAD4, SMARCA4. STK11, TP53, TSC1, TSC2, and VHL.  The following genes were evaluated for sequence changes only: SDHA and HOXB13 c.251G>A variant only.    09/01/2020 - 01/11/2021 Neo-Adjuvant Chemotherapy   Adriamycin  and Cytoxan  x4 09/01/2020-10/12/2020 Weekly Taxol  x 12  10/26/2020-01/11/2021(dose reduced d/t AE)   02/07/2021 Surgery   Right lumpectomy Jan): invasive and in situ ductal carcinoma, 2.8cm, clear margins, with metastatic carcinoma in 1/1 right axillary lymph nodes.   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 She underwent gastrectomy for her stomach cancer and has since lost a significant amount of weight. She also  reports disability in her arm, which has resulted in her inability to walk without assistance like a walker. Melody Rodriguez has been experiencing constant pain, which she rates as a seven on a scale of ten. She reports that her pain management has been inadequate, with her last prescription for pain medication having been discontinued in October. Melody Rodriguez expresses frustration and resignation about her condition, stating that she feels she has run out of treatment options. She is particularly concerned about the impact of her illness on her two children, one of whom is autistic.         ALLERGIES:  is allergic to duloxetine , keflex [cephalexin], tramadol , latex, and naproxen.  MEDICATIONS:  Current Outpatient Medications  Medication Sig Dispense Refill   ACCU-CHEK GUIDE test strip USE TO MONITOR BLOOD GLUCOSE 3 TIME(S) DAILY     anastrozole  (ARIMIDEX ) 1 MG tablet Take 1 tablet (1 mg total) by mouth daily. 90 tablet 3   enoxaparin (LOVENOX) 40 MG/0.4ML injection Inject into the skin.     famotidine  (PEPCID ) 40 MG tablet Take by mouth.     glipiZIDE  (GLUCOTROL ) 5 MG tablet Take 1 tablet (5 mg total) by mouth daily. 30 tablet 0   Glucose Blood (BLOOD GLUCOSE TEST STRIPS) STRP Use as directed 100 each 5   Glucose Blood (BLOOD GLUCOSE TEST STRIPS) STRP Use to check blood sugar as directed 100 each 5   HYDROmorphone  HCl (DILAUDID ) 1 MG/ML LIQD Take 2 mLs (2 mg total) by mouth every 6 (six) hours as needed 20 mL 0   HYDROmorphone  HCl (DILAUDID ) 1 MG/ML LIQD Take 2 mLs (2 mg total) by mouth every 6 (six) hours as needed for up to 5 days 20 mL 0   hydrOXYzine  (ATARAX ) 10 MG/5ML syrup Take 5 mLs (10 mg dose) by mouth 4 (four) times a day as needed 240 mL 2   methocarbamol  (ROBAXIN ) 500 MG tablet Take 1 tablet (500 mg total) by mouth 3 (three) times daily. 90 tablet 0   Multiple Vitamin (MULTI-VITAMIN) tablet Take by mouth.     ondansetron  (ZOFRAN ) 4 MG/5ML solution Take 5 mLs (4 mg total) by mouth every 8 (eight)  hours as needed for nausea 400 mL 0   ondansetron  (ZOFRAN ) 4 MG/5ML solution Take 5 mLs (4 mg total) by mouth every 8 (eight) hours as needed for Nausea for up to 30 days 400 mL 0   oxyCODONE  (ROXICODONE ) 5 MG/5ML solution Take 5 mLs (5 mg total) by mouth every 4 (four) hours as needed for pain for up to 16 days. 473 mL 0   pantoprazole  (PROTONIX ) 40 MG tablet Take 1 tablet (40 mg total) by mouth daily. 30 tablet 11   pregabalin  (LYRICA ) 200 MG capsule Take 1 capsule (200 mg total) by mouth 2 (two) times daily. 60 capsule 11   Semaglutide ,0.25 or 0.5MG /DOS, (OZEMPIC , 0.25 OR 0.5 MG/DOSE,) 2 MG/3ML SOPN Inject 0.25 mg into the skin once a week. 3 mL 3   sucralfate  (CARAFATE ) 1 GM/10ML suspension Take 10 mLs (1 g total) by mouth 4 (four) times daily before meals and nightly 1200 mL 11   venlafaxine  XR (EFFEXOR -XR) 75 MG 24 hr capsule Take 1 capsule (75 mg total) by mouth daily. 30 capsule 2   Current Facility-Administered Medications  Medication Dose Route Frequency Provider Last Rate Last Admin   0.9 %  sodium  chloride infusion  500 mL Intravenous Continuous Federico Rosario BROCKS, MD        PHYSICAL EXAMINATION: ECOG PERFORMANCE STATUS: 1 - Symptomatic but completely ambulatory  Vitals:   09/24/23 1209  BP: 138/82  Pulse: 84  Resp: 18  Temp: 98.2 F (36.8 C)  SpO2: 100%   Filed Weights   09/24/23 1209  Weight: 167 lb 9.3 oz (76 kg)      LABORATORY DATA:  I have reviewed the data as listed    Latest Ref Rng & Units 06/08/2023    7:49 PM 04/02/2023    8:39 AM 02/08/2023   10:18 AM  CMP  Glucose 70 - 99 mg/dL 855  894  96   BUN 6 - 20 mg/dL 36  11  13   Creatinine 0.44 - 1.00 mg/dL 9.12  9.32  9.35   Sodium 135 - 145 mmol/L 142  142  141   Potassium 3.5 - 5.1 mmol/L 3.6  4.1  4.3   Chloride 98 - 111 mmol/L 100  108  108   CO2 22 - 32 mmol/L 31  28  23    Calcium  8.9 - 10.3 mg/dL 9.8  9.5  9.1   Total Protein 6.5 - 8.1 g/dL 8.8  7.2  7.5   Total Bilirubin 0.3 - 1.2 mg/dL 0.8  0.4   0.7   Alkaline Phos 38 - 126 U/L 84  62  58   AST 15 - 41 U/L 31  11  18    ALT 0 - 44 U/L 77  10  18     Lab Results  Component Value Date   WBC 11.2 (H) 06/08/2023   HGB 15.4 (H) 06/08/2023   HCT 47.4 (H) 06/08/2023   MCV 92.8 06/08/2023   PLT 271 06/08/2023   NEUTROABS 8.7 (H) 06/08/2023    ASSESSMENT & PLAN:  Malignant neoplasm of upper-outer quadrant of right breast in female, estrogen receptor positive (HCC) 07/28/2020:Patient palpated a right breast mass for 1-2 years. Mammogram showed a 2.2cm mass at the 11 o'clock position with surrounding calcifications, 6.4cm in total extent, and up to 5 abnormal right axillary lymph nodes. 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   Treatment plan: 1. Neoadjuvant chemotherapy (MammaPrint test High Risk): AC foll by Taxol  completed 01/11/21 2. 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 Adjuvant radiation therapy 05/11/2021-07/04/2021 4. Follow-up adjuvant antiestrogen therapy along with abemaciclib  (patient has total of 4 lymph nodes positive), but she declined because of concern for toxicities [facility] 16070: Treatment of refractory nausea 5. ALND 03/28/21: 3/7 LN positive -------------------------------------------------------------------------------------------------------------------------------  Current treatment: Zoladex  plus letrozole  (patient did not want to start Verzenio  because of concern for toxicities).  Zoladex  discontinued status post bilateral salpingo-oophorectomy on 02/22/2023   Patient stopped taking letrozole  when the stomach cancer surgery happens. 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  (CTNNA1 gene mutation) Postop complications: Weight loss 131 pounds, nausea and vomiting   Bone Density: 08/31/21: T Score 1.1 (Normal) Mammograms: 11/13/2022: Benign, density Cat B CT CAP 09/07/2023: Increase in size of  the liver lesion 3.9 cm (used to be 1.8 cm), possibly new lesion 0.6 cm  Biopsy at [facility]: Consistent with metastatic breast cancer ER 92%, PR 98%, HER2 0  Assessment and Plan    Metastatic Breast Cancer Breast cancer metastasized to the liver. Discussed potential treatment options including Verzenio  (abemaciclib ) and anastrozole , but patient declined due to concerns about side effects and efficacy given her lack of a stomach. Also discussed potential surgical intervention and radioembolization, but patient declined. -Initiate hospice care. -Provide letter detailing patient's diagnosis and prognosis for disability benefits.  Chronic Pain Patient reports chronic pain at a level of 7. Current pain management regimen appears to be inadequate. -Refer to hospice care for improved pain management.  Gastrectomy Patient underwent gastrectomy for signet ring carcinoma of the stomach. She has lost significant weight and has difficulty with medication absorption due to lack of stomach. -Continue to monitor nutritional status and adjust medications as needed.  Arm Disability Patient has significant disability in her arm following lymph node removal and subsequent hematoma. She reports ongoing pain and inability to lift the arm. -Refer to hospice care for potential physical therapy and pain management.  Caregiver Support Patient lives with her 79 year old autistic son and has limited support. She currently has an aide for 3 hours a day. -Connect with hospice team to assess for additional support needs.          No orders of the defined types were  placed in this encounter.  The patient has a good understanding of the overall plan. she agrees with it. she will call with any problems that may develop before the next visit here. Total time spent: 30 mins including face to face time and time spent for planning, charting and co-ordination of care   Melody MARLA Chad, MD 09/24/23

## 2023-09-24 NOTE — Telephone Encounter (Signed)
 Per Dr. Pamelia Hoit, wants referral for hospice for this pt. Pt is declining all treatment options. [facility] called and left message with pt information for referral. Office information left.

## 2023-09-25 ENCOUNTER — Other Ambulatory Visit (HOSPITAL_COMMUNITY): Payer: Self-pay

## 2023-09-25 ENCOUNTER — Encounter: Payer: Self-pay | Admitting: Hematology and Oncology

## 2023-09-25 ENCOUNTER — Telehealth: Payer: Self-pay | Admitting: Pharmacy Technician

## 2023-09-25 ENCOUNTER — Inpatient Hospital Stay (HOSPITAL_BASED_OUTPATIENT_CLINIC_OR_DEPARTMENT_OTHER): Payer: Medicaid Other | Admitting: Hematology and Oncology

## 2023-09-25 ENCOUNTER — Other Ambulatory Visit: Payer: Self-pay | Admitting: *Deleted

## 2023-09-25 DIAGNOSIS — Z170 Estrogen receptor positive status [ER+]: Secondary | ICD-10-CM

## 2023-09-25 DIAGNOSIS — C50411 Malignant neoplasm of upper-outer quadrant of right female breast: Secondary | ICD-10-CM

## 2023-09-25 MED ORDER — METAXALONE 800 MG PO TABS: 800.0000 mg | ORAL_TABLET | Freq: Three times a day (TID) | ORAL | 0 refills | Status: DC

## 2023-09-25 MED ORDER — LETROZOLE 2.5 MG PO TABS
2.5000 mg | ORAL_TABLET | Freq: Every day | ORAL | 3 refills | Status: DC
  Filled 2023-09-25: qty 90, 90d supply, fill #0
  Filled 2023-12-24: qty 90, 90d supply, fill #1
  Filled 2024-03-22: qty 90, 90d supply, fill #2
  Filled 2024-06-23: qty 90, 90d supply, fill #3

## 2023-09-25 MED ORDER — ABEMACICLIB 50 MG PO TABS
50.0000 mg | ORAL_TABLET | Freq: Two times a day (BID) | ORAL | 1 refills | Status: DC
  Filled 2023-09-28: qty 56, 28d supply, fill #0
  Filled 2023-10-22: qty 56, 28d supply, fill #1

## 2023-09-25 NOTE — Progress Notes (Signed)
 Per MD pt requesting to proceed with treatment at this time and cancel hospice referral.  MD would also like for pt to be placed under palliative care at this time as well, orders placed.

## 2023-09-25 NOTE — Assessment & Plan Note (Signed)
 07/28/2020:Patient palpated a right breast mass for 1-2 years. Mammogram showed a 2.2cm mass at the 11 o'clock position with surrounding calcifications, 6.4cm in total extent, and up to 5 abnormal right axillary lymph nodes. 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   Treatment plan: 1. Neoadjuvant chemotherapy (MammaPrint test High Risk): AC foll by Taxol  completed 01/11/21 2. 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 Adjuvant radiation therapy 05/11/2021-07/04/2021 4. Follow-up adjuvant antiestrogen therapy along with abemaciclib  (patient has total of 4 lymph nodes positive), but she declined because of concern for toxicities [facility] 16070: Treatment of refractory nausea 5. ALND 03/28/21: 3/7 LN positive -------------------------------------------------------------------------------------------------------------------------------  Current treatment: Zoladex  plus letrozole  (patient did not want to start Verzenio  because of concern for toxicities).  Zoladex  discontinued status post bilateral salpingo-oophorectomy on 02/22/2023   Patient stopped taking letrozole  until the stomach cancer surgery happens. 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  (CTNNA1 gene mutation) Postop complications: Weight loss 131 pounds, nausea and vomiting   Bone Density: 08/31/21: T Score 1.1 (Normal) Mammograms: 11/13/2022: Benign, density Cat B CT CAP 09/07/2023: Increase in size of the liver lesion 3.9 cm (used to be 1.8 cm), possibly new lesion 0.6 cm  Biopsy at  [facility]: Consistent with metastatic breast cancer ER 92%, PR 98%, HER2 0  Discussion: I discussed with the patient that she has multiple ongoing issues which are challenging.  For the metastatic breast cancer, we would generally recommend systemic treatment with Verzinio along with aromatase inhibitor therapy.     Muscle spasms: Sent prescription for Skelaxin . Appointment with [facility] for education and labs.

## 2023-09-25 NOTE — Progress Notes (Signed)
 HEMATOLOGY-ONCOLOGY TELEPHONE VISIT PROGRESS NOTE  I connected with our patient on 09/25/23 at  2:30 PM EST by telephone and verified that I am speaking with the correct person using two identifiers.  I discussed the limitations, risks, security and privacy concerns of performing an evaluation and management service by telephone and the availability of in person appointments.  I also discussed with the patient that there may be a patient responsible charge related to this service. The patient expressed understanding and agreed to proceed.   History of Present Illness:    History of Present Illness   The patient, with a history of met breast cancer, presents after a recent clinic visit. She reports feeling overwhelmed by her condition but is determined to fight it. She expresses some reluctance towards new treatments but ultimately agrees to try them. She was informed that the new medication, Verzenio , can work even without a stomach, which was a concern due to her lack of a stomach.  These two medications are intended to starve the cancer of nutrition and electricity, respectively.  The patient also reports experiencing back spasms, for which she has been taking Robaxin . However, she finds that this medication is no longer effective and will be switching to Skelaxin .  The patient also mentions struggling with dehydration due to her lack of a stomach, making it difficult for her to drink enough water .        Oncology History  Malignant neoplasm of upper-outer quadrant of right breast in female, estrogen receptor positive (HCC)  07/28/2020 Initial Diagnosis   Patient palpated a right breast mass for 1-2 years. Mammogram showed a 2.2cm mass at the 11 o'clock position with surrounding calcifications, 6.4cm in total extent, and up to 5 abnormal right axillary lymph nodes. Biopsy showed invasive and in situ ductal carcinoma in the breast and axilla, grade 2, HER-2 equivocal by IHC (2+), negative by  FISH (ratio 1.6), ER+ 50% weak, PR+ 20%, Ki67 20%.    08/05/2020 Miscellaneous   MammaPrint: High risk luminal type B   08/11/2020 Genetic Testing   Negative genetic testing: no pathogenic variants detected in [facility] Breast Cancer STAT Panel or Common Hereditary Cancers panel. The report dates are August 11, 2020 and August 19, 2020, respectively. Two variants of uncertain signficance were detected - one in the CTNNA1 gene called c.86del and the second in the MLH1 gene called c.808A>G.   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 The change in variant classification was made as a result of re-review of the evidence in light of new variant interpretation guidelines and/or new information.   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 The amended report date is February 16, 2022.   The STAT Breast cancer panel offered by [facility] includes sequencing and rearrangement analysis for the following 9 genes:  ATM, BRCA1, BRCA2, CDH1, CHEK2, PALB2, PTEN, STK11 and TP53.  The Common Hereditary Cancers Panel offered by [facility] includes sequencing and/or deletion duplication testing of the following 48 genes: APC, ATM, AXIN2, BARD1, BMPR1A, BRCA1, BRCA2, BRIP1, CDH1, CDK4, CDKN2A (p14ARF), CDKN2A (p16INK4a), CHEK2, CTNNA1, DICER1, EPCAM (Deletion/duplication testing only), GREM1 (promoter region deletion/duplication testing only), KIT, MEN1, MLH1, MSH2, MSH3, MSH6, MUTYH, NBN, NF1, NTHL1, PALB2, PDGFRA, PMS2, POLD1, POLE, PTEN, RAD50, RAD51C, RAD51D, RNF43, SDHB, SDHC, SDHD, SMAD4, SMARCA4. STK11, TP53, TSC1, TSC2, and VHL.  The following genes were evaluated for sequence changes only: SDHA and HOXB13 c.251G>A variant only.    09/01/2020 -  01/11/2021 Neo-Adjuvant Chemotherapy   Adriamycin  and Cytoxan  x4 09/01/2020-10/12/2020 Weekly Taxol  x 12  10/26/2020-01/11/2021(dose reduced d/t AE)   02/07/2021 Surgery   Right lumpectomy Jan): invasive and in  situ ductal carcinoma, 2.8cm, clear margins, with metastatic carcinoma in 1/1 right axillary lymph nodes.   03/28/2021 Surgery   Axillary lymph node dissection: 3/7 lymph nodes +   05/10/2021 - 07/01/2021 Radiation Therapy   Site Technique Total Dose (Gy) Dose per Fx (Gy) Completed Fx Beam Energies  Breast, Right: Breast_Rt 3D 50.4/50.4 1.8 28/28 10X  Breast, Right: Breast_Rt_Bst specialPort 12/12 2 6/6 15E  Sclav-RT: SCV_Rt 3D 50.4/50.4 1.8 28/28      07/2021 -  Anti-estrogen oral therapy   Zoladex  + Letrozole  + Verzenio      REVIEW OF SYSTEMS:   Constitutional: Denies fevers, chills or abnormal weight loss All other systems were reviewed with the patient and are negative. Observations/Objective:     Assessment Plan:  Malignant neoplasm of upper-outer quadrant of right breast in female, estrogen receptor positive (HCC) 07/28/2020:Patient palpated a right breast mass for 1-2 years. Mammogram showed a 2.2cm mass at the 11 o'clock position with surrounding calcifications, 6.4cm in total extent, and up to 5 abnormal right axillary lymph nodes. 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   Treatment plan: 1. Neoadjuvant chemotherapy (MammaPrint test High Risk): AC foll by Taxol  completed 01/11/21 2. 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 Adjuvant radiation therapy 05/11/2021-07/04/2021 4. Follow-up adjuvant antiestrogen therapy along with abemaciclib  (patient has total of 4 lymph nodes positive), but she declined because of concern for toxicities [facility] 16070: Treatment of refractory nausea 5. ALND 03/28/21: 3/7 LN  positive -------------------------------------------------------------------------------------------------------------------------------  Current treatment: Zoladex  plus letrozole  (patient did not want to start Verzenio  because of concern for toxicities).  Zoladex  discontinued status post bilateral salpingo-oophorectomy on 02/22/2023   Patient stopped taking letrozole  until the stomach cancer surgery happens. 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  (CTNNA1 gene mutation) Postop complications: Weight loss 131 pounds, nausea and vomiting   Bone Density: 08/31/21: T Score 1.1 (Normal) Mammograms: 11/13/2022: Benign, density Cat B CT CAP 09/07/2023: Increase in size of the liver lesion 3.9 cm (used to be 1.8 cm), possibly new lesion 0.6 cm  Biopsy at [facility]: Consistent with metastatic breast cancer ER 92%, PR 98%, HER2 0  Discussion: I discussed with the patient that she has multiple ongoing issues which are challenging.  For the metastatic breast cancer, we would generally recommend systemic treatment with Verzinio along with aromatase inhibitor therapy.     Muscle spasms: Sent prescription for Skelaxin . Appointment with [facility] for education and labs.    --------------------------------- Assessment and Plan    Breast Cancer Discussed starting Verzenio  and Letrozole . Verzenio  will be started at a lower dose and increased as tolerated. Main side effect is diarrhea. Letrozole  will also be started. Both medications aim to starve and kill the cancer cells. -Start Verzenio  at a lower dose and increase as tolerated. -Start Letrozole . -Pharmacist, John, to provide education on this treatment. -Work on getting Verzenio  with no out-of-pocket cost  for the patient.  Muscle Spasms Current muscle relaxant, Robaxin , not providing relief. -Switch from Robaxin   to Skelaxin  for muscle spasms.  Dehydration Difficulty with fluid intake due to lack of stomach. -Encourage sipping water  throughout the day using a tumbler and straw. -Continue receiving fluids during visits to [facility].  Surgical Consultation Discussed potential surgical intervention and radioembolization procedure. -Connect with surgeons and radiology department to discuss potential treatment options.  Financial Assistance Patient expressed need for financial assistance. -Notify social worker, Rosaline, about the patient's treatment status to facilitate financial assistance.  Follow-up Plan to address issues one by one as treatment progresses. -Schedule follow-up appointment.          I discussed the assessment and treatment plan with the patient. The patient was provided an opportunity to ask questions and all were answered. The patient agreed with the plan and demonstrated an understanding of the instructions. The patient was advised to call back or seek an in-person evaluation if the symptoms worsen or if the condition fails to improve as anticipated.   I provided 20 minutes of non-face-to-face time during this encounter.  This includes time for charting and coordination of care   Naomi MARLA Chad, MD

## 2023-09-25 NOTE — Telephone Encounter (Signed)
 Oral Oncology Patient Advocate Encounter  After completing a benefits investigation, prior authorization for verzenio  is not required at this time through [facility].  Patient's copay is $4.     Estefana Moellers, CPhT-Adv Oncology Pharmacy Patient Advocate [facility] Direct Number: [phone]  Fax: [phone]

## 2023-09-26 ENCOUNTER — Other Ambulatory Visit (HOSPITAL_COMMUNITY): Payer: Self-pay

## 2023-09-26 ENCOUNTER — Inpatient Hospital Stay: Payer: Medicaid Other | Admitting: Licensed Clinical Social Worker

## 2023-09-26 ENCOUNTER — Other Ambulatory Visit: Payer: Self-pay

## 2023-09-26 NOTE — Progress Notes (Signed)
 [facility] CSW Progress Note  Clinical Child Psychotherapist submitted applications to [employer] and to [employer] on transmontaigne with supporting documents provided by pt. Notified pt of submission.  CSW will continue to assist in applications as grants become available.    Melody Walthall E Destinee Taber, Melody Rodriguez Clinical Social Worker [facility]    Patient is participating in a Managed Medicaid Plan:  Yes

## 2023-09-27 ENCOUNTER — Other Ambulatory Visit (HOSPITAL_COMMUNITY): Payer: Self-pay

## 2023-09-28 ENCOUNTER — Other Ambulatory Visit (HOSPITAL_COMMUNITY): Payer: Self-pay

## 2023-09-28 ENCOUNTER — Other Ambulatory Visit: Payer: Self-pay | Admitting: Pharmacy Technician

## 2023-09-28 ENCOUNTER — Other Ambulatory Visit: Payer: Self-pay

## 2023-09-28 NOTE — Progress Notes (Signed)
 Specialty Pharmacy Initial Fill Coordination Note  Melody Rodriguez is a 48 y.o. female contacted today regarding refills of specialty medication(s) Abemaciclib  (VERZENIO ) .  Patient requested [name] at [facility] at [place]  on 10/02/23   Medication will be filled on 10/01/23.   Patient is aware of $4 copayment.

## 2023-10-01 ENCOUNTER — Other Ambulatory Visit (HOSPITAL_COMMUNITY): Payer: Self-pay

## 2023-10-01 ENCOUNTER — Encounter: Payer: Self-pay | Admitting: Licensed Clinical Social Worker

## 2023-10-01 NOTE — Progress Notes (Signed)
 [facility] CSW Progress Note  Clinical Child Psychotherapist received notice from [name] that patient was approved for assistance. CSW shared notice with patient.     Adhya Cocco E Monserrate Blaschke, LCSW Clinical Social Worker [facility]    Patient is participating in a Managed Medicaid Plan:  Yes

## 2023-10-02 ENCOUNTER — Inpatient Hospital Stay: Payer: Medicaid Other | Admitting: Pharmacist

## 2023-10-02 ENCOUNTER — Inpatient Hospital Stay: Payer: Medicaid Other

## 2023-10-02 ENCOUNTER — Inpatient Hospital Stay: Payer: Medicaid Other | Admitting: Licensed Clinical Social Worker

## 2023-10-02 ENCOUNTER — Other Ambulatory Visit (HOSPITAL_COMMUNITY): Payer: Self-pay

## 2023-10-02 VITALS — BP 153/92 | HR 69 | Temp 97.8°F | Resp 18 | Ht 64.0 in | Wt 168.0 lb

## 2023-10-02 DIAGNOSIS — Z170 Estrogen receptor positive status [ER+]: Secondary | ICD-10-CM

## 2023-10-02 DIAGNOSIS — C50411 Malignant neoplasm of upper-outer quadrant of right female breast: Secondary | ICD-10-CM | POA: Diagnosis not present

## 2023-10-02 LAB — CBC WITH DIFFERENTIAL (CANCER CENTER ONLY)
Abs Immature Granulocytes: 0.01 10*3/uL (ref 0.00–0.07)
Basophils Absolute: 0 10*3/uL (ref 0.0–0.1)
Basophils Relative: 1 %
Eosinophils Absolute: 0.2 10*3/uL (ref 0.0–0.5)
Eosinophils Relative: 2 %
HCT: 38.9 % (ref 36.0–46.0)
Hemoglobin: 13.1 g/dL (ref 12.0–15.0)
Immature Granulocytes: 0 %
Lymphocytes Relative: 34 %
Lymphs Abs: 2.3 10*3/uL (ref 0.7–4.0)
MCH: 29.1 pg (ref 26.0–34.0)
MCHC: 33.7 g/dL (ref 30.0–36.0)
MCV: 86.4 fL (ref 80.0–100.0)
Monocytes Absolute: 0.4 10*3/uL (ref 0.1–1.0)
Monocytes Relative: 6 %
Neutro Abs: 3.7 10*3/uL (ref 1.7–7.7)
Neutrophils Relative %: 57 %
Platelet Count: 207 10*3/uL (ref 150–400)
RBC: 4.5 MIL/uL (ref 3.87–5.11)
RDW: 13.2 % (ref 11.5–15.5)
WBC Count: 6.6 10*3/uL (ref 4.0–10.5)
nRBC: 0 % (ref 0.0–0.2)

## 2023-10-02 LAB — CMP (CANCER CENTER ONLY)
ALT: 22 U/L (ref 0–44)
AST: 20 U/L (ref 15–41)
Albumin: 4.5 g/dL (ref 3.5–5.0)
Alkaline Phosphatase: 75 U/L (ref 38–126)
Anion gap: 5 (ref 5–15)
BUN: 12 mg/dL (ref 6–20)
CO2: 30 mmol/L (ref 22–32)
Calcium: 9.7 mg/dL (ref 8.9–10.3)
Chloride: 109 mmol/L (ref 98–111)
Creatinine: 0.66 mg/dL (ref 0.44–1.00)
GFR, Estimated: >60 mL/min (ref >60)
Glucose, Bld: 59 mg/dL — ABNORMAL LOW (ref 70–99)
Potassium: 3.5 mmol/L (ref 3.5–5.1)
Sodium: 144 mmol/L (ref 135–145)
Total Bilirubin: 0.5 mg/dL (ref 0.0–1.2)
Total Protein: 7.3 g/dL (ref 6.5–8.1)

## 2023-10-02 NOTE — Progress Notes (Signed)
 Patient counseled in clinic visit note on 10/02/23

## 2023-10-02 NOTE — Progress Notes (Signed)
 [facility]       Telephone: [phone]?Fax: [phone]   Oncology Clinical Pharmacist Practitioner Initial Assessment  Melody Rodriguez is a 48 y.o. female with a diagnosis of breast cancer. They were contacted today via in-person visit.  Indication/Regimen Abemaciclib  (Verzenio ) is being used appropriately for treatment of metastatic breast cancer by Dr. Vinay Gudena.      Wt Readings from Last 1 Encounters:  10/02/23 168 lb (76.2 kg)    Estimated body surface area is 1.85 meters squared as calculated from the following:   Height as of this encounter: 5' 4 (1.626 m).   Weight as of this encounter: 168 lb (76.2 kg).  The dosing regimen is 50 mg by mouth every 12 hours on days 1 to 28 of a 28-day cycle. This is being given  in combination with anastrozole  . It is planned to continue until disease progression or unacceptable toxicity. Prescription dose and frequency assessed for appropriateness.  Patient has agreed to treatment which is documented in physician note on 09/25/23. Counseled patient on administration, dosing, side effects, monitoring, drug-food interactions, safe handling, storage, and disposal.  Patient had gastrectomy on 05/04/23 at [facility]. Discussed with manufacturer [employer] about absorption concerns. They felt that with intact jejunum and ability to swallow, abemaciclib  absorption should not be an issue. Will need to closely monitor. She also follows with Dr. Murray Collier form pain management. She has a follow up on 11/15/23. She is not interested in seeing nutrition at this time. She confirms that she is able to swallow food and liquids. She does not have a J-tube but did at some point in the past per her report. She was on enoxaparin at one point for a blood clot but she stated that she did not want to continue and stopped this medication on her own.   Dose Modifications Dr. Gudena is starting [name] at a reduced dose of abemaciclib  of 50 mg by mouth  every 12 hours  Access Assessment [name] will be receiving abemaciclib  through [facility]el[facility] Concerns: none Start date if known: 10/03/23  Adherence Assessment Reviewed importance on keeping a med schedule and plan for any missed doses Barriers to adherence identified? No, discussed ability to swallow despite gastrectomy above.  Allergies Allergies  Allergen Reactions   Duloxetine  Rash   Keflex [Cephalexin] Hives and Rash   Tramadol  Other (See Comments)    Makes patient feel weird    Latex Itching and Swelling   Naproxen Itching    Vitals    10/02/2023   11:07 AM 09/24/2023   12:09 PM 06/09/2023    9:24 AM  Oncology Vitals  Height 163 cm 163 cm   Weight 76.204 kg 76.014 kg   Weight (lbs) 168 lbs 167 lbs 9 oz   BMI 28.84 kg/m2 28.77 kg/m2   Temp 97.8 F (36.6 C) 98.2 F (36.8 C) 98 F (36.7 C)  Pulse Rate 69 84 84  BP 153/92 138/82 134/78  Resp 18 18 16   SpO2 100 % 100 % 98 %  BSA (m2) 1.85 m2 1.85 m2      Laboratory Data    Latest Ref Rng & Units 10/02/2023    9:48 AM 06/08/2023    7:04 PM 04/02/2023    8:39 AM  CBC EXTENDED  WBC 4.0 - 10.5 K/uL 6.6  11.2  6.6   RBC 3.87 - 5.11 MIL/uL 4.50  5.11  4.33   Hemoglobin 12.0 - 15.0  g/dL 86.8  84.5  86.6   HCT 36.0 - 46.0 % 38.9  47.4  39.2   Platelets 150 - 400 K/uL 207  271  205   NEUT# 1.7 - 7.7 K/uL 3.7  8.7  4.4   Lymph# 0.7 - 4.0 K/uL 2.3  1.8  1.7        Latest Ref Rng & Units 10/02/2023    9:48 AM 06/08/2023    7:49 PM 04/02/2023    8:39 AM  CMP  Glucose 70 - 99 mg/dL 59  855  894   BUN 6 - 20 mg/dL 12  36  11   Creatinine 0.44 - 1.00 mg/dL 9.33  9.12  9.32   Sodium 135 - 145 mmol/L 144  142  142   Potassium 3.5 - 5.1 mmol/L 3.5  3.6  4.1   Chloride 98 - 111 mmol/L 109  100  108   CO2 22 - 32 mmol/L 30  31  28    Calcium  8.9 - 10.3 mg/dL 9.7  9.8  9.5   Total Protein 6.5 - 8.1 g/dL 7.3  8.8  7.2   Total Bilirubin 0.0 - 1.2 mg/dL 0.5  0.8  0.4   Alkaline Phos 38 -  126 U/L 75  84  62   AST 15 - 41 U/L 20  31  11    ALT 0 - 44 U/L 22  77  10    Contraindications Contraindications were reviewed? Yes Contraindications to therapy were identified? No   Safety Precautions The following safety precautions for the use of abemaciclib  were reviewed:  Changes in kidney function: importance of drinking plenty of fluids and monitoring urine output Diarrhea: we reviewed that diarrhea is common with abemaciclib  and confirmed that she does have loperamide (Imodium) at home.  We reviewed how to take this medication PRN and gave her information on abemaciclib  Decreased white blood cells (WBCs) and increased risk for infection: we discussed the importance of having a thermometer and what the Centers for Disease Control and Prevention (CDC) considers a fever which is 100.85F (38C) or higher.  Gave patient 24/7 triage line to call if any fevers or symptoms Decreased hemoglobin, part of red blood cells that carry iron  and oxygen Fatigue Nausea and Vomiting Hepatotoxicity: reviewed to contact clinic for RUQ pain that will not subside, yellowing of eyes/skin Decreased appetite or weight loss Abdominal pain Decreased platelet count and increased risk for bleeding Venous thromboembolism (VTE): reviewed signs of deep vein thrombosis (DVT) such as leg swelling, redness, pain, or tenderness and signs of pulmonary embolism (PE) such as shortness of breath, rapid or irregular heartbeat, cough, chest pain, or lightheadedness ILD/Pneumonitis: we reviewed potential symptoms including cough, shortness, and fatigue. Handling body fluids and waste Pregnancy, sexual activity, and contraception Avoid grapefruit products Reviewed to take the medication every 12 hours (with food sometimes can be easier on the stomach) and to take it at the same time every day. Discussed proper storage and handling of abemaciclib   Medication Reconciliation Current Outpatient Medications  Medication Sig  Dispense Refill   ACCU-CHEK GUIDE test strip USE TO MONITOR BLOOD GLUCOSE 3 TIME(S) DAILY     CREON 24000-76000 units CPEP Take by mouth. Take 2 capsules by mouth 3 times daily with meals. Take 2 capsules with meals and 1 with snacks     Glucose Blood (BLOOD GLUCOSE TEST STRIPS) STRP Use to check blood sugar as directed 100 each 5   HYDROmorphone  HCl (DILAUDID ) 1 MG/ML LIQD Take  2 mLs (2 mg total) by mouth every 6 (six) hours as needed 20 mL 0   letrozole  (FEMARA ) 2.5 MG tablet Take 1 tablet (2.5 mg total) by mouth daily. 90 tablet 3   ondansetron  (ZOFRAN ) 4 MG/5ML solution Take 5 mLs (4 mg total) by mouth every 8 (eight) hours as needed for Nausea for up to 30 days 400 mL 0   pantoprazole  (PROTONIX ) 40 MG tablet Take 1 tablet (40 mg total) by mouth daily. 30 tablet 11   pregabalin  (LYRICA ) 200 MG capsule Take 1 capsule (200 mg total) by mouth 2 (two) times daily. 60 capsule 11   sucralfate  (CARAFATE ) 1 GM/10ML suspension Take 10 mLs (1 g total) by mouth 4 (four) times daily before meals and nightly 1200 mL 11   venlafaxine  XR (EFFEXOR -XR) 75 MG 24 hr capsule Take 1 capsule (75 mg total) by mouth daily. 30 capsule 2   abemaciclib  (VERZENIO ) 50 MG tablet Take 1 tablet (50 mg total) by mouth 2 (two) times daily. (Patient not taking: Reported on 10/02/2023) 60 tablet 1   metaxalone  (SKELAXIN ) 800 MG tablet Take 1 tablet (800 mg total) by mouth 3 (three) times daily. (Patient not taking: Reported on 10/02/2023) 90 tablet 0   Multiple Vitamin (MULTI-VITAMIN) tablet Take by mouth. (Patient not taking: Reported on 10/02/2023)     Current Facility-Administered Medications  Medication Dose Route Frequency Provider Last Rate Last Admin   0.9 %  sodium chloride  infusion  500 mL Intravenous Continuous Dorsey, Ying C, MD        Medication reconciliation is based on the patient's most recent medication list in the electronic medical record (EMR) including herbal products and OTC medications.   The patient's  medication list was reviewed today with the patient? Yes   Drug-drug interactions (DDIs) DDIs were evaluated? Yes Significant DDIs identified?  Reviewed duplicate meds and updated medication list  Drug-Food Interactions Drug-food interactions were evaluated? Yes Drug-food interactions identified? Grapefruit products  Follow-up Plan  Patient education handout given to patient Start abemaciclib  50 mg by mouth every 12 hours. Will start tomorrow Start letrozole  2.5 mg by mouth daily. Will start tomorrow. Had been on anastrozole  but had not taken since gastrectomy per her report. Monitor for letrozole  side effects. Believes caused arthralgias prior. Monitor for absorption of abemaciclib  Dr. Odean has ordered CA 27.29 tumor marker which will result next day and go directly to him. Will add labs, and Dr. Odean visit on 10/16/23 Ms. Lynde can follow up with clinical pharmacy as deemed necessary by Dr. Gudena.  Jerilyn ONEIDA Joesph participated in the discussion, expressed understanding, and voiced agreement with the above plan. All questions were answered to her satisfaction. The patient was advised to contact the clinic at [phone] with any questions or concerns prior to her return visit.   I spent 60 minutes assessing the patient.  Miosotis Wetsel A. Lucila, PharmD, BCOP, CPP  Norleen DELENA Lucila, RPH-CPP, 10/02/2023 12:13 PM  **Disclaimer: This note was dictated with voice recognition software. Similar sounding words can inadvertently be transcribed and this note may contain transcription errors which may not have been corrected upon publication of note.**

## 2023-10-02 NOTE — Progress Notes (Signed)
 [facility] CSW Counseling Note  Patient was referred by self. Treatment type: Individual  Presenting Concerns: Patient and/or family reports the following symptoms/concerns:  adjustment to stage IV cancer diagnosis Duration of problem: 1 months; Severity of problem: moderate   Orientation:oriented to person, place, time/date, and situation.   Affect: Appropriate, Congruent, and Tearful Risk of harm to self or others: No plan to harm self or others  Patient and/or Family's Strengths/Protective Factors: Ability for insight  Capable of independent living  Communication skills      Goals Addressed: Patient will:  Increase knowledge and/or ability of: coping skills  Increase healthy adjustment to current life circumstances and process new diagnosis   Progress towards Goals: Initial   Interventions: Interventions utilized:  Strength-based and Supportive      Assessment: Patient currently experiencing adjustment to stage IV cancer diagnosis. Pt has been dealing with years of cancer diagnoses and resulting physical concerns (breast cancer, stomach cancer, now metastatic breast cancer). Pt shared some of her story related to her recent stomach cancer and experience at another hospital system.  She also relayed her family dynamics and limited support from family. Pt has more support from friends/chosen family.  She also has two sons (13 & 23).  Pt is accepting of her diagnosis and is trying to do her best to stay independent and here, especially for her younger son. She is balancing doing the right things for her health with what will minimize physical issues (ex: drinking watered down Eveline Pan).  Pt is doing well recognizing what activities drain her energy and where she needs to find alternatives (ex: using transportation instead of driving when needed; utilizing aide 3 hrs/week for household chores).  Her goal is to try to be here for her younger son to graduate high school if possible and  to make the most of her time here by doing what she enjoys and spending time with those close to her when she can.   Briefly discussed getting a written will with her wishes for her son's care if she passes before he turns 18.    Plan: Follow up with CSW: 1 week Behavioral recommendations: Continue balancing your activities to prioritize energy going to the things you enjoy and bring you good quality of life. Find the will you started and follow-up with lawyer to finalize.  Referral(s): Advance Directives clinic       Vanassa Penniman E Yomara Toothman, LCSW   Patient is participating in a Managed Medicaid Plan:  Yes

## 2023-10-03 LAB — CANCER ANTIGEN 27.29: CA 27.29: 138.4 U/mL — ABNORMAL HIGH (ref 0.0–38.6)

## 2023-10-04 ENCOUNTER — Other Ambulatory Visit: Payer: Self-pay

## 2023-10-04 ENCOUNTER — Other Ambulatory Visit (HOSPITAL_COMMUNITY): Payer: Self-pay

## 2023-10-04 ENCOUNTER — Encounter: Payer: Self-pay | Admitting: *Deleted

## 2023-10-04 MED ORDER — GLIPIZIDE ER 2.5 MG PO TB24
2.5000 mg | ORAL_TABLET | Freq: Every day | ORAL | 0 refills | Status: DC
  Filled 2023-10-04 (×2): qty 30, 30d supply, fill #0

## 2023-10-08 NOTE — Progress Notes (Unsigned)
Palliative Medicine [facility]  Telephone:([phone] Fax:([phone]   Name: Melody Rodriguez Date: 10/08/2023 MRN: [id number]  DOB: 01/04/1976  Patient Care Team: Modesta Messing as PCP - General (Physician Assistant) Abigail Miyamoto, MD as Consulting Physician (General Surgery) Serena Croissant, MD as Consulting Physician (Hematology and Oncology) Antony Blackbird, MD as Consulting Physician (Radiation Oncology)    REASON FOR CONSULTATION: Melody Rodriguez is a 48 y.o. 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  Palliative ask to see for symptom management and goals of care.    SOCIAL HISTORY:     reports that she has quit smoking. Her smoking use included cigars and cigarettes. She has a 22 pack-year smoking history. She has never used smokeless tobacco. She reports that she does not currently use alcohol. She reports that she does not currently use drugs after having used the following drugs: Marijuana.  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/2022   Stomach cancer   Depression    Diabetes mellitus without complication (HCC)    glipizide and actps; cbgs 200s fasting   Family history of colon cancer    GERD (gastroesophageal reflux disease)    Headache    History of goiter    History of radiation therapy    right breast/scv  05/10/21-07/01/21  Dr Antony Blackbird   Hyperlipidemia    Hypertension    Monoallelic mutation of CTNNA3 gene    Recurrent major depressive disorder (HCC)     PAST SURGICAL HISTORY:  Past Surgical History:  Procedure Laterality Date   BREAST BIOPSY Right 02/18/2021   Procedure: EVACUATION HEMATOMA RIGHT  AXILLA;  Surgeon: Abigail Miyamoto, MD;  Location: [facility];  Service: General;  Laterality: Right;   BREAST CYST EXCISION Right    Patient does not recall (2014 or 2015)   BREAST LUMPECTOMY WITH RADIOACTIVE SEED AND SENTINEL LYMPH NODE BIOPSY Right 02/07/2021   Procedure: RIGHT BREAST LUMPECTOMY WITH RADIOACTIVE SEED AND SEED TARGETED LYMPH NODE BIOPSY AND SENTINEL LYMPH NODE BIOPSY;  Surgeon: Abigail Miyamoto, MD;  Location: [facility];  Service: General;  Laterality: Right;   CESAREAN SECTION     x2   COLONOSCOPY     IR IMAGING GUIDED PORT INSERTION  09/15/2020   IR REMOVAL TUN CV CATH W/O FL  09/15/2020   NODE DISSECTION Right 03/28/2021   Procedure: RIGHT AXILLARY LYMPH NODE DISSECTION;  Surgeon: Abigail Miyamoto, MD;  Location: [facility];  Service: General;  Laterality: Right;   PORT-A-CATH REMOVAL Left 02/07/2021   Procedure: REMOVAL PORT-A-CATH;  Surgeon: Abigail Miyamoto, MD;  Location:  SURGERY CENTER;  Service: General;  Laterality: Left;   ROBOTIC ASSISTED BILATERAL SALPINGO OOPHERECTOMY Bilateral 02/22/2023   Procedure: XI ROBOTIC ASSISTED LAPAROSCOPIC BILATERAL SALPINGO OOPHORECTOMY;  Surgeon: Carver Fila, MD;  Location: [facility];  Service: Gynecology;  Laterality: Bilateral;   THYROIDECTOMY, PARTIAL     UPPER GASTROINTESTINAL ENDOSCOPY      HEMATOLOGY/ONCOLOGY HISTORY:  Oncology History  Malignant neoplasm of upper-outer quadrant of right breast in female, estrogen receptor positive (HCC)  07/28/2020 Initial Diagnosis   Patient  palpated a right breast mass for 1-2 years. Mammogram showed a 2.2cm mass at the 11 o'clock position with surrounding calcifications, 6.4cm in total extent, and up to 5 abnormal right axillary lymph nodes. 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     08/05/2020 Miscellaneous   MammaPrint: High risk luminal type B   08/11/2020 Genetic Testing   Negative genetic testing: no pathogenic variants detected in [facility] Breast Cancer STAT Panel or Common Hereditary Cancers panel. The report dates are August 11, 2020 and August 19, 2020, respectively. Two variants of uncertain signficance were detected - one in the CTNNA1 gene called c.86del and the second in the MLH1 gene called c.808A>G.   UPDATE:  The MLH1 c.808A>G VUS was reclassified to "Likely Benign" on 02/19/2021. The change in variant classification was made as a result of re-review of the evidence in light of new variant interpretation guidelines and/or new information.   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 The amended report date is February 16, 2022.   The STAT Breast cancer panel offered by [facility] includes sequencing and rearrangement analysis for the following 9 genes:  ATM, BRCA1, BRCA2, CDH1, CHEK2, PALB2, PTEN, STK11 and TP53.  The Common Hereditary Cancers Panel offered by [facility] includes sequencing and/or deletion duplication testing of the following 48 genes: APC, ATM, AXIN2, BARD1, BMPR1A, BRCA1, BRCA2, BRIP1, CDH1, CDK4, CDKN2A (p14ARF), CDKN2A (p16INK4a), CHEK2, CTNNA1, DICER1, EPCAM (Deletion/duplication testing only), GREM1 (promoter region deletion/duplication testing only), KIT, MEN1, MLH1, MSH2, MSH3, MSH6, MUTYH, NBN, NF1, NTHL1, PALB2, PDGFRA, PMS2, POLD1, POLE, PTEN, RAD50, RAD51C, RAD51D, RNF43, SDHB, SDHC, SDHD, SMAD4, SMARCA4. STK11, TP53, TSC1, TSC2, and VHL.  The following genes were evaluated for sequence changes only: SDHA and HOXB13 c.251G>A variant only.    09/01/2020 - 01/11/2021 Neo-Adjuvant Chemotherapy   Adriamycin and Cytoxan x4 09/01/2020-10/12/2020 Weekly Taxol x 12  10/26/2020-01/11/2021(dose reduced d/t AE)   02/07/2021 Surgery   Right lumpectomy Magnus Ivan): invasive and in situ ductal carcinoma, 2.8cm, clear margins, with  metastatic carcinoma in 1/1 right axillary lymph nodes.   03/28/2021 Surgery   Axillary lymph node dissection: 3/7 lymph nodes +   05/10/2021 - 07/01/2021 Radiation Therapy   Site Technique Total Dose (Gy) Dose per Fx (Gy) Completed Fx Beam Energies  Breast, Right: Breast_Rt 3D 50.4/50.4 1.8 28/28 10X  Breast, Right: Breast_Rt_Bst specialPort 12/12 2 6/6 15E  Sclav-RT: SCV_Rt 3D 50.4/50.4 1.8 28/28      07/2021 -  Anti-estrogen oral therapy   Zoladex + Letrozole + Verzenio     ALLERGIES:  is allergic to duloxetine, keflex [cephalexin], tramadol, latex, and naproxen.  MEDICATIONS:  Current Outpatient Medications  Medication Sig Dispense Refill   abemaciclib (VERZENIO) 50 MG tablet Take 1 tablet (50 mg total) by mouth 2 (two) times daily. (Patient not taking: Reported on 10/02/2023) 60 tablet 1   ACCU-CHEK GUIDE test strip USE TO MONITOR BLOOD GLUCOSE 3 TIME(S) DAILY     CREON 24000-76000 units CPEP Take by mouth. Take 2 capsules by mouth 3 times daily with meals. Take 2 capsules with meals and 1 with snacks     glipiZIDE (GLUCOTROL XL) 2.5 MG 24 hr tablet Take 1 tablet (2.5 mg total) by mouth daily.This replaces the 5 mg. 30 tablet 0   Glucose Blood (BLOOD GLUCOSE TEST STRIPS) STRP Use to check blood sugar as  directed 100 each 5   HYDROmorphone HCl (DILAUDID) 1 MG/ML LIQD Take 2 mLs (2 mg total) by mouth every 6 (six) hours as needed 20 mL 0   letrozole (FEMARA) 2.5 MG tablet Take 1 tablet (2.5 mg total) by mouth daily. 90 tablet 3   metaxalone (SKELAXIN) 800 MG tablet Take 1 tablet (800 mg total) by mouth 3 (three) times daily. (Patient not taking: Reported on 10/02/2023) 90 tablet 0   Multiple Vitamin (MULTI-VITAMIN) tablet Take by mouth. (Patient not taking: Reported on 10/02/2023)     ondansetron (ZOFRAN) 4 MG/5ML solution Take 5 mLs (4 mg total) by mouth every 8 (eight) hours as needed for Nausea for up to 30 days 400 mL 0   pantoprazole (PROTONIX) 40 MG tablet Take 1 tablet (40 mg  total) by mouth daily. 30 tablet 11   pregabalin (LYRICA) 200 MG capsule Take 1 capsule (200 mg total) by mouth 2 (two) times daily. 60 capsule 11   sucralfate (CARAFATE) 1 GM/10ML suspension Take 10 mLs (1 g total) by mouth 4 (four) times daily before meals and nightly 1200 mL 11   venlafaxine XR (EFFEXOR-XR) 75 MG 24 hr capsule Take 1 capsule (75 mg total) by mouth daily. 30 capsule 2   Current Facility-Administered Medications  Medication Dose Route Frequency Provider Last Rate Last Admin   0.9 %  sodium chloride infusion  500 mL Intravenous Continuous Imogene Burn, MD        VITAL SIGNS: There were no vitals taken for this visit. There were no vitals filed for this visit.  Estimated body mass index is 28.84 kg/m as calculated from the following:   Height as of 10/02/23: 5\' 4"  (1.626 m).   Weight as of 10/02/23: 168 lb (76.2 kg).  LABS: CBC:    Component Value Date/Time   WBC 6.6 10/02/2023 0948   WBC 11.2 (H) 06/08/2023 1904   HGB 13.1 10/02/2023 0948   HCT 38.9 10/02/2023 0948   PLT 207 10/02/2023 0948   MCV 86.4 10/02/2023 0948   NEUTROABS 3.7 10/02/2023 0948   LYMPHSABS 2.3 10/02/2023 0948   MONOABS 0.4 10/02/2023 0948   EOSABS 0.2 10/02/2023 0948   BASOSABS 0.0 10/02/2023 0948   Comprehensive Metabolic Panel:    Component Value Date/Time   NA 144 10/02/2023 0948   K 3.5 10/02/2023 0948   CL 109 10/02/2023 0948   CO2 30 10/02/2023 0948   BUN 12 10/02/2023 0948   CREATININE 0.66 10/02/2023 0948   GLUCOSE 59 (L) 10/02/2023 0948   CALCIUM 9.7 10/02/2023 0948   AST 20 10/02/2023 0948   ALT 22 10/02/2023 0948   ALKPHOS 75 10/02/2023 0948   BILITOT 0.5 10/02/2023 0948   PROT 7.3 10/02/2023 0948   ALBUMIN 4.5 10/02/2023 0948    RADIOGRAPHIC STUDIES: No results found.  PERFORMANCE STATUS (ECOG) : {CHL ONC ECOG ZO:[id number]}  Review of Systems Unless otherwise noted, a complete review of systems is negative.  Physical Exam General: NAD Cardiovascular:  regular rate and rhythm Pulmonary: clear ant fields Abdomen: soft, nontender, + bowel sounds Extremities: no edema, no joint deformities Skin: no rashes Neurological: Alert and oriented x3  IMPRESSION: *** I introduced myself, Maygan RN, and Palliative's role in collaboration with the oncology team. Concept of Palliative Care was introduced as specialized medical care for people and their families living with serious illness.  It focuses on providing relief from the symptoms and stress of a serious illness.  The goal is to improve quality  of life for both the patient and the family. Values and goals of care important to patient and family were attempted to be elicited.    We discussed *** current illness and what it means in the larger context of *** on-going co-morbidities. Natural disease trajectory and expectations were discussed.  I discussed the importance of continued conversation with family and their medical providers regarding overall plan of care and treatment options, ensuring decisions are within the context of the patients values and GOCs.  PLAN: Established therapeutic relationship. Education provided on palliative's role in collaboration with their Oncology/Radiation team. I will plan to see patient back in 2-4 weeks in collaboration to other oncology appointments.    Patient expressed understanding and was in agreement with this plan. She also understands that She can call the clinic at any time with any questions, concerns, or complaints.   Thank you for your referral and allowing Palliative to assist in Mrs. Melody Rodriguez's care.   Number and complexity of problems addressed: ***HIGH - 1 or more chronic illnesses with SEVERE exacerbation, progression, or side effects of treatment - advanced cancer, pain. Any controlled substances utilized were prescribed in the context of palliative care.   Visit consisted of counseling and education dealing with the complex and  emotionally intense issues of symptom management and palliative care in the setting of serious and potentially life-threatening illness.  Signed by: Willette Alma, AGPCNP-BC Palliative Medicine Team/[facility]

## 2023-10-09 ENCOUNTER — Inpatient Hospital Stay: Payer: Medicaid Other | Admitting: Licensed Clinical Social Worker

## 2023-10-09 ENCOUNTER — Encounter: Payer: Self-pay | Admitting: Nurse Practitioner

## 2023-10-09 ENCOUNTER — Other Ambulatory Visit (HOSPITAL_COMMUNITY): Payer: Self-pay

## 2023-10-09 ENCOUNTER — Inpatient Hospital Stay (HOSPITAL_BASED_OUTPATIENT_CLINIC_OR_DEPARTMENT_OTHER): Payer: Medicaid Other | Admitting: Nurse Practitioner

## 2023-10-09 VITALS — BP 143/91 | HR 81 | Temp 98.0°F | Resp 16 | Wt 168.3 lb

## 2023-10-09 DIAGNOSIS — M792 Neuralgia and neuritis, unspecified: Secondary | ICD-10-CM

## 2023-10-09 DIAGNOSIS — R530 Neoplastic (malignant) related fatigue: Secondary | ICD-10-CM

## 2023-10-09 DIAGNOSIS — R110 Nausea: Secondary | ICD-10-CM | POA: Diagnosis not present

## 2023-10-09 DIAGNOSIS — Z515 Encounter for palliative care: Secondary | ICD-10-CM

## 2023-10-09 DIAGNOSIS — C50411 Malignant neoplasm of upper-outer quadrant of right female breast: Secondary | ICD-10-CM | POA: Diagnosis not present

## 2023-10-09 DIAGNOSIS — G893 Neoplasm related pain (acute) (chronic): Secondary | ICD-10-CM | POA: Diagnosis not present

## 2023-10-09 DIAGNOSIS — Z170 Estrogen receptor positive status [ER+]: Secondary | ICD-10-CM

## 2023-10-09 MED ORDER — BACLOFEN 10 MG PO TABS
10.0000 mg | ORAL_TABLET | Freq: Three times a day (TID) | ORAL | 2 refills | Status: DC
  Filled 2023-10-09: qty 60, 20d supply, fill #0
  Filled 2023-11-03: qty 60, 20d supply, fill #1
  Filled 2023-11-28 – 2023-12-06 (×2): qty 60, 20d supply, fill #2

## 2023-10-09 MED ORDER — HYDROMORPHONE HCL 2 MG PO TABS
2.0000 mg | ORAL_TABLET | Freq: Four times a day (QID) | ORAL | 0 refills | Status: DC | PRN
  Filled 2023-10-09: qty 60, 15d supply, fill #0

## 2023-10-09 MED ORDER — TRIAMCINOLONE ACETONIDE 0.1 % EX CREA
1.0000 | TOPICAL_CREAM | Freq: Two times a day (BID) | CUTANEOUS | 1 refills | Status: AC | PRN
  Filled 2023-10-09: qty 60, 30d supply, fill #0

## 2023-10-09 MED ORDER — ONDANSETRON 8 MG PO TBDP
8.0000 mg | ORAL_TABLET | Freq: Three times a day (TID) | ORAL | 4 refills | Status: DC | PRN
  Filled 2023-10-09: qty 60, 20d supply, fill #0
  Filled 2023-11-03: qty 60, 20d supply, fill #1
  Filled 2023-12-05: qty 60, 20d supply, fill #2
  Filled 2024-02-01: qty 60, 20d supply, fill #3
  Filled 2024-03-10: qty 60, 20d supply, fill #4

## 2023-10-09 MED ORDER — PREGABALIN 100 MG PO CAPS
100.0000 mg | ORAL_CAPSULE | Freq: Two times a day (BID) | ORAL | 1 refills | Status: DC
  Filled 2023-10-09: qty 60, 30d supply, fill #0
  Filled 2023-11-03 – 2023-11-06 (×2): qty 60, 30d supply, fill #1

## 2023-10-09 NOTE — Progress Notes (Signed)
[facility] CSW Counseling Note  Patient was referred by self. Treatment type: Individual  Presenting Concerns: Patient and/or family reports the following symptoms/concerns:  adjustment to stage IV cancer diagnosis Duration of problem: 1 months; Severity of problem: moderate   Orientation:oriented to person, place, time/date, and situation.   Affect: Appropriate, Congruent, and Tearful Risk of harm to self or others: No plan to harm self or others  Patient and/or Family's Strengths/Protective Factors: Ability for insight  Capable of independent living  Communication skills      Goals Addressed: Patient will:  Increase knowledge and/or ability of: coping skills  Increase healthy adjustment to current life circumstances and process new diagnosis   Progress towards Goals: Progressing   Interventions: Interventions utilized:  Strength-based and Supportive      Assessment: Patient is doing well overall today. She has started [employer] and met with palliative care since last visit. Pt continues to have a strong mindset and looking to move towards light at the end of the tunnel while focusing on living her best life. Discussed today how to add windows/ more light to her tunnel by finding things that give joy and focusing on those. Even with eating which is a source of frustration trying to find food  and drinks that will react well with her body, trying to find joy in the things she can eat, like guacamole.   Pt has decreased stress today as she was approved for disability and for [employer] as well.  Pt is planning to tell her kids about diagnosis when her older son visits for her birthday at the end of February.    Plan: Follow up with CSW: 2 weeks Behavioral recommendations: Continue balancing your activities to prioritize energy going to the things you enjoy and bring you good quality of life. Find the will you started and follow-up with lawyer to finalize.  Referral(s): Advance  Directives clinic; [facility]       Tayari Yankee E Asbury Hair, LCSW   Patient is participating in a Managed Medicaid Plan:  Yes

## 2023-10-11 ENCOUNTER — Other Ambulatory Visit (HOSPITAL_COMMUNITY): Payer: Self-pay

## 2023-10-12 ENCOUNTER — Other Ambulatory Visit (HOSPITAL_COMMUNITY): Payer: Self-pay

## 2023-10-12 ENCOUNTER — Inpatient Hospital Stay: Payer: Medicaid Other | Admitting: Licensed Clinical Social Worker

## 2023-10-12 DIAGNOSIS — Z170 Estrogen receptor positive status [ER+]: Secondary | ICD-10-CM

## 2023-10-12 NOTE — Progress Notes (Signed)
[facility] Healthcare Advance Directives Clinical Social Work  Patient presented to Advance Directives Clinic  to review and complete healthcare advance directives.  Clinical Social Worker met with patient.  The patient designated Melody Rodriguez (friend [phone]) as their primary healthcare agent and [name] (son [phone]) as their secondary agent.  Patient also completed healthcare living will.    Documents were notarized and copies made for patient/family. Clinical Social Worker will send documents to medical records to be scanned into patient's chart. Clinical Social Worker encouraged patient/family to contact with any additional questions or concerns.   Rachel Moulds, LCSW Clinical Social Worker [facility]        Patient is participating in a Managed Medicaid Plan:  Yes

## 2023-10-15 ENCOUNTER — Other Ambulatory Visit: Payer: Self-pay

## 2023-10-15 ENCOUNTER — Other Ambulatory Visit (HOSPITAL_COMMUNITY): Payer: Self-pay

## 2023-10-16 ENCOUNTER — Other Ambulatory Visit (HOSPITAL_COMMUNITY): Payer: Self-pay

## 2023-10-17 ENCOUNTER — Other Ambulatory Visit (HOSPITAL_COMMUNITY): Payer: Self-pay

## 2023-10-18 ENCOUNTER — Other Ambulatory Visit (HOSPITAL_COMMUNITY): Payer: Self-pay

## 2023-10-22 ENCOUNTER — Other Ambulatory Visit (HOSPITAL_COMMUNITY): Payer: Self-pay

## 2023-10-22 ENCOUNTER — Other Ambulatory Visit: Payer: Self-pay

## 2023-10-22 ENCOUNTER — Other Ambulatory Visit (HOSPITAL_COMMUNITY): Payer: Self-pay | Admitting: Pharmacy Technician

## 2023-10-22 NOTE — Progress Notes (Signed)
Specialty Pharmacy Ongoing Clinical Assessment Note  Melody Rodriguez is a 48 y.o. female who is being followed by the specialty pharmacy service for RxSp Oncology   Patient's specialty medication(s) reviewed today: Abemaciclib (VERZENIO)   Missed doses in the last 4 weeks: 0   Patient/Caregiver did not have any additional questions or concerns.   Therapeutic benefit summary: Unable to assess   Adverse events/side effects summary: No adverse events/side effects   Patient's therapy is appropriate to: Continue    Goals Addressed             This Visit's Progress    Stabilization of disease       Patient is on track. Patient will be evaluated at upcoming provider appointment to assess progress         Follow up:  3 months  Bobette Mo Specialty Pharmacist

## 2023-10-22 NOTE — Progress Notes (Signed)
Specialty Pharmacy Refill Coordination Note  Melody Rodriguez is a 48 y.o. female contacted today regarding refills of specialty medication(s) Abemaciclib Melody Rodriguez)   Patient requested Melody Rodriguez at [facility] date: 10/26/23   Medication will be filled on 10/25/23.

## 2023-10-22 NOTE — Assessment & Plan Note (Signed)
07/28/2020:Patient palpated a right breast mass for 1-2 years. Mammogram showed a 2.2cm mass at the 11 o'clock position with surrounding calcifications, 6.4cm in total extent, and up to 5 abnormal right axillary lymph nodes. 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   Treatment plan: 1. Neoadjuvant chemotherapy (MammaPrint test High Risk): AC foll by Taxol completed 01/11/21 2. 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 Adjuvant radiation therapy 05/11/2021-07/04/2021 4. Follow-up adjuvant antiestrogen therapy along with abemaciclib (patient has total of 4 lymph nodes positive), but she declined because of concern for toxicities URCC 16070: Treatment of refractory nausea 5. ALND 03/28/21: 3/7 LN positive -------------------------------------------------------------------------------------------------------------------------------  Current treatment: Zoladex plus letrozole (patient did not want to start Verzenio because of concern for toxicities).  Zoladex discontinued status post bilateral salpingo-oophorectomy on 02/22/2023   Patient stopped taking letrozole until the stomach cancer surgery happens. 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  (CTNNA1 gene mutation) Postop complications: Weight loss 131 pounds, nausea and vomiting   Bone Density: 08/31/21: T Score 1.1 (Normal) Mammograms: 11/13/2022: Benign, density Cat B CT CAP 09/07/2023: Increase in size of the liver lesion 3.9 cm (used to be 1.8 cm), possibly new lesion 0.6 cm  Biopsy at  [facility]: Consistent with metastatic breast cancer ER 92%, PR 98%, HER2 0   Treatment Plan: Verzenio with Letrozole    Toxicities:

## 2023-10-22 NOTE — Progress Notes (Signed)
 Palliative Medicine [facility]  [phone] [phone]   Name: Melody Rodriguez Date: 10/22/2023 MRN: [id number]  DOB: December 17, 1975  Patient Care Team: Melody Rodriguez as PCP - General (Physician Assistant) Melody Berg, MD as Consulting Physician (General Surgery) Melody Potts, MD as Consulting Physician (Hematology and Oncology) Melody Agent, MD as Consulting Physician (Radiation Oncology) Pickenpack-Cousar, Fannie SAILOR, NP as Nurse Practitioner (Hospice and Palliative Medicine)    INTERVAL HISTORY: Melody Rodriguez is a 48 y.o. female with oncologic medical history including estrogen receptor positive metastatic breast cancer (07/2020) currently on Abemaciclib .  Palliative ask to see for symptom management and goals of care.  SOCIAL HISTORY:     reports that she has quit smoking. Her smoking use included cigars and cigarettes. She has a 22 pack-year smoking history. She has never used smokeless tobacco. She reports that she does not currently use alcohol. She reports that she does not currently use drugs after having used the following drugs: Marijuana.  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  MEDICATIONS:  Current Outpatient Medications  Medication Sig Dispense Refill   abemaciclib  (VERZENIO ) 50 MG tablet Take 1 tablet (50 mg total) by mouth 2 (two) times daily. (Patient not taking: Reported on 10/02/2023) 60 tablet 1   ACCU-CHEK GUIDE test strip USE TO MONITOR BLOOD GLUCOSE 3 TIME(S) DAILY     baclofen  (LIORESAL ) 10 MG tablet Take 1 tablet (10 mg total) by mouth 3 (three) times daily. 60 each 2   CREON 24000-76000 units CPEP Take by mouth. Take 2 capsules by mouth 3 times daily with meals. Take 2 capsules with meals and 1 with snacks     glipiZIDE  (GLUCOTROL  XL) 2.5 MG 24 hr tablet Take 1 tablet (2.5 mg total) by mouth daily.This replaces the 5 mg. 30 tablet 0   Glucose Blood (BLOOD GLUCOSE TEST STRIPS) STRP Use to check blood sugar as directed 100 each 5   HYDROmorphone  (DILAUDID ) 2 MG tablet Take 1 tablet (2 mg total) by mouth every 6 (six) hours as needed for severe pain (pain score 7-10). 60 tablet 0   letrozole  (FEMARA ) 2.5 MG tablet Take 1 tablet (2.5 mg total) by mouth daily. 90 tablet 3   ondansetron  (ZOFRAN -ODT) 8 MG disintegrating tablet Take 1 tablet (8 mg total) by mouth every 8 (eight) hours as needed for nausea or vomiting. 60 tablet 4   pantoprazole  (PROTONIX ) 40 MG tablet Take 1 tablet (40 mg total) by mouth daily. 30 tablet 11   pregabalin  (LYRICA ) 100 MG capsule Take 1 capsule (100  mg total) by mouth 2 (two) times daily. 60 capsule 1   sucralfate  (CARAFATE ) 1 GM/10ML suspension Take 10 mLs (1 g total) by mouth 4 (four) times daily before meals and nightly 1200 mL 11   triamcinolone  cream (KENALOG ) 0.1 % Apply 1 Application topically 2 (two) times daily as needed. 40 g 1   venlafaxine  XR (EFFEXOR -XR) 75 MG 24 hr capsule Take 1 capsule (75 mg total) by mouth daily. 30 capsule 2   Current Facility-Administered Medications  Medication Dose Route Frequency Provider Last Rate Last Admin   0.9 %  sodium chloride  infusion  500 mL Intravenous Continuous  Melody Rosario BROCKS, MD        VITAL SIGNS: There were no vitals taken for this visit. There were no vitals filed for this visit.  Estimated body mass index is 28.89 kg/m as calculated from the following:   Height as of 10/02/23: 5' 4 (1.626 m).   Weight as of 10/09/23: 168 lb 4.8 oz (76.3 kg).   PERFORMANCE STATUS (ECOG) : 1 - Symptomatic but completely ambulatory   Physical Exam General: NAD Cardiovascular: regular rate and rhythm Pulmonary: clear ant fields Abdomen: soft, nontender, + bowel sounds Extremities: no edema, no joint deformities Skin: no rashes Neurological:   Discussed the use of AI scribe software for clinical note transcription with the patient, who gave verbal consent to proceed.  IMPRESSION:  Melody Rodriguez presented to clinic today for symptom management follow-up. No acute distress. Denies nausea, vomiting, constipation, or diarrhea. Taking things one day at time. Occasional fatigue however able to remain active. Some days are better than others. Appetite is good. Her blood sugar felt to be dropping. Snacks were provided. Reports she is feeling better.  She was seen by Melody Rodriguez today for follow-up prior to our visit. States her medication dosage for Verzenio  was increased from 50 mg to 100 mg.   She developed a rash on her back, initially appearing as a bump, which she pressed, resulting in a discharge. She was seen by Melody Rodriguez who suspected it to be shingles. He has started her on Valtrex .  Her son took a video of the rash, and she noted that it looks worse over time.  Melody Rodriguez reports her pain, and other symptoms are well controlled on current regimen. We discussed her regimen which includes Lyrica , hydromorphone , and baclofen . Tolerating without difficulty. She does endorse back spasms somewhat improved however some days tend to be worse causing limitations in her movement or activity. Advised patient she may increase baclofen  to 1-2 tablets as needed. Education  provided on use and safety. She verbalized understanding.   We will continue to closely monitor and support as needed.  I discussed the importance of continued conversation with family and their medical providers regarding overall plan of care and treatment options, ensuring decisions are within the context of the patients values and GOCs.  Assessment and Plan  Shingles Patient reported a rash on her back, which was diagnosed as shingles by Melody Rodriguez. She was prescribed an antiviral medication. -Encouraged her to pick up her Valtrex  medication as prescribed.  Breast Cancer Patient is on Verzenio  for breast cancer, which was recently increased to 100mg  by her oncologist. -Continue Verzenio  100mg  as prescribed by oncologist.  Muscle Spasms Patient reported benefit from baclofen  for muscle spasms, however some days spasms are worst limiting activity.  -Increase baclofen  to 1-2 tablets as needed for muscle spasms. Cautioned patient about potential drowsiness and advised against driving after taking  medication.  General Health Maintenance -Continue current medications and follow up with oncologist as scheduled. -Address financial and social issues related to disability benefits. -She is scheduled to establish support from Social Work team today.  -I will plan to see patient back in 4-6 weeks. Sooner if needed.  Patient expressed understanding and was in agreement with this plan. She also understands that She can call the clinic at any time with any questions, concerns, or complaints.   Any controlled substances utilized were prescribed in the context of palliative care. PDMP has been reviewed.   Visit consisted of counseling and education dealing with the complex and emotionally intense issues of symptom management and palliative care in the setting of serious and potentially life-threatening illness.  Levon Borer, AGPCNP-BC  Palliative Medicine Team/[facility]

## 2023-10-23 ENCOUNTER — Other Ambulatory Visit: Payer: Self-pay | Admitting: Hematology and Oncology

## 2023-10-23 NOTE — Telephone Encounter (Signed)
Creola Corn, it looks like you had discontinued this. Just wanted to send it your way incase it needs filled.

## 2023-10-24 ENCOUNTER — Encounter: Payer: Self-pay | Admitting: Nurse Practitioner

## 2023-10-24 ENCOUNTER — Other Ambulatory Visit (HOSPITAL_COMMUNITY): Payer: Self-pay

## 2023-10-24 ENCOUNTER — Inpatient Hospital Stay: Payer: Medicaid Other | Attending: Hematology and Oncology

## 2023-10-24 ENCOUNTER — Inpatient Hospital Stay: Payer: Medicaid Other | Admitting: Licensed Clinical Social Worker

## 2023-10-24 ENCOUNTER — Inpatient Hospital Stay (HOSPITAL_BASED_OUTPATIENT_CLINIC_OR_DEPARTMENT_OTHER): Payer: Medicaid Other | Admitting: Nurse Practitioner

## 2023-10-24 ENCOUNTER — Inpatient Hospital Stay (HOSPITAL_BASED_OUTPATIENT_CLINIC_OR_DEPARTMENT_OTHER): Payer: Medicaid Other | Admitting: Hematology and Oncology

## 2023-10-24 ENCOUNTER — Other Ambulatory Visit: Payer: Self-pay

## 2023-10-24 VITALS — BP 134/72 | HR 63 | Temp 98.4°F | Resp 19 | Ht 64.0 in | Wt 167.6 lb

## 2023-10-24 DIAGNOSIS — E876 Hypokalemia: Secondary | ICD-10-CM | POA: Insufficient documentation

## 2023-10-24 DIAGNOSIS — Z79624 Long term (current) use of inhibitors of nucleotide synthesis: Secondary | ICD-10-CM | POA: Insufficient documentation

## 2023-10-24 DIAGNOSIS — C50411 Malignant neoplasm of upper-outer quadrant of right female breast: Secondary | ICD-10-CM | POA: Diagnosis not present

## 2023-10-24 DIAGNOSIS — Z170 Estrogen receptor positive status [ER+]: Secondary | ICD-10-CM

## 2023-10-24 DIAGNOSIS — Z515 Encounter for palliative care: Secondary | ICD-10-CM

## 2023-10-24 DIAGNOSIS — Z79811 Long term (current) use of aromatase inhibitors: Secondary | ICD-10-CM | POA: Diagnosis not present

## 2023-10-24 DIAGNOSIS — Z9221 Personal history of antineoplastic chemotherapy: Secondary | ICD-10-CM | POA: Insufficient documentation

## 2023-10-24 DIAGNOSIS — Z79899 Other long term (current) drug therapy: Secondary | ICD-10-CM | POA: Insufficient documentation

## 2023-10-24 DIAGNOSIS — G893 Neoplasm related pain (acute) (chronic): Secondary | ICD-10-CM

## 2023-10-24 DIAGNOSIS — Z1721 Progesterone receptor positive status: Secondary | ICD-10-CM | POA: Insufficient documentation

## 2023-10-24 DIAGNOSIS — Z1732 Human epidermal growth factor receptor 2 negative status: Secondary | ICD-10-CM | POA: Diagnosis not present

## 2023-10-24 DIAGNOSIS — C773 Secondary and unspecified malignant neoplasm of axilla and upper limb lymph nodes: Secondary | ICD-10-CM | POA: Diagnosis present

## 2023-10-24 DIAGNOSIS — Z923 Personal history of irradiation: Secondary | ICD-10-CM | POA: Diagnosis not present

## 2023-10-24 DIAGNOSIS — R530 Neoplastic (malignant) related fatigue: Secondary | ICD-10-CM | POA: Diagnosis not present

## 2023-10-24 LAB — CBC WITH DIFFERENTIAL (CANCER CENTER ONLY)
Abs Immature Granulocytes: 0.01 10*3/uL (ref 0.00–0.07)
Basophils Absolute: 0 10*3/uL (ref 0.0–0.1)
Basophils Relative: 1 %
Eosinophils Absolute: 0.1 10*3/uL (ref 0.0–0.5)
Eosinophils Relative: 2 %
HCT: 37 % (ref 36.0–46.0)
Hemoglobin: 12.1 g/dL (ref 12.0–15.0)
Immature Granulocytes: 0 %
Lymphocytes Relative: 25 %
Lymphs Abs: 1.1 10*3/uL (ref 0.7–4.0)
MCH: 29.7 pg (ref 26.0–34.0)
MCHC: 32.7 g/dL (ref 30.0–36.0)
MCV: 90.9 fL (ref 80.0–100.0)
Monocytes Absolute: 0.2 10*3/uL (ref 0.1–1.0)
Monocytes Relative: 4 %
Neutro Abs: 3 10*3/uL (ref 1.7–7.7)
Neutrophils Relative %: 68 %
Platelet Count: 153 10*3/uL (ref 150–400)
RBC: 4.07 MIL/uL (ref 3.87–5.11)
RDW: 14.5 % (ref 11.5–15.5)
WBC Count: 4.5 10*3/uL (ref 4.0–10.5)
nRBC: 0 % (ref 0.0–0.2)

## 2023-10-24 LAB — CMP (CANCER CENTER ONLY)
ALT: 11 U/L (ref 0–44)
AST: 11 U/L — ABNORMAL LOW (ref 15–41)
Albumin: 4.4 g/dL (ref 3.5–5.0)
Alkaline Phosphatase: 74 U/L (ref 38–126)
Anion gap: 5 (ref 5–15)
BUN: 12 mg/dL (ref 6–20)
CO2: 31 mmol/L (ref 22–32)
Calcium: 9.2 mg/dL (ref 8.9–10.3)
Chloride: 108 mmol/L (ref 98–111)
Creatinine: 0.9 mg/dL (ref 0.44–1.00)
GFR, Estimated: >60 mL/min (ref >60)
Glucose, Bld: 85 mg/dL (ref 70–99)
Potassium: 3.3 mmol/L — ABNORMAL LOW (ref 3.5–5.1)
Sodium: 144 mmol/L (ref 135–145)
Total Bilirubin: 0.5 mg/dL (ref 0.0–1.2)
Total Protein: 7 g/dL (ref 6.5–8.1)

## 2023-10-24 MED ORDER — VALACYCLOVIR HCL 1 G PO TABS: 1000.0000 mg | ORAL_TABLET | Freq: Two times a day (BID) | ORAL | 0 refills | Status: DC

## 2023-10-24 MED ORDER — ABEMACICLIB 100 MG PO TABS
100.0000 mg | ORAL_TABLET | Freq: Two times a day (BID) | ORAL | 1 refills | Status: DC
Start: 2023-10-24 — End: 2023-11-14
  Filled 2023-10-24: qty 56, 28d supply, fill #0

## 2023-10-24 MED ORDER — HYDROMORPHONE HCL 2 MG PO TABS
2.0000 mg | ORAL_TABLET | Freq: Four times a day (QID) | ORAL | 0 refills | Status: DC | PRN
  Filled 2023-10-24: qty 60, 15d supply, fill #0

## 2023-10-24 NOTE — Progress Notes (Signed)
 Patient Care Team: Jackson, Kerra J, PA-C as PCP - General (Physician Assistant) Vernetta Berg, MD as Consulting Physician (General Surgery) Odean Potts, MD as Consulting Physician (Hematology and Oncology) Shannon Agent, MD as Consulting Physician (Radiation Oncology) Pickenpack-Cousar, Fannie SAILOR, NP as Nurse Practitioner [facility] and Palliative Medicine)  DIAGNOSIS:  Encounter Diagnosis  Name Primary?   Malignant neoplasm of upper-outer quadrant of right breast in female, estrogen receptor positive (HCC) Yes    SUMMARY OF ONCOLOGIC HISTORY: Oncology History  Malignant neoplasm of upper-outer quadrant of right breast in female, estrogen receptor positive (HCC)  07/28/2020 Initial Diagnosis   Patient palpated a right breast mass for 1-2 years. Mammogram showed a 2.2cm mass at the 11 o'clock position with surrounding calcifications, 6.4cm in total extent, and up to 5 abnormal right axillary lymph nodes. 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    08/05/2020 Miscellaneous   MammaPrint: High risk luminal type B   08/11/2020 Genetic Testing   Negative genetic testing: no pathogenic variants detected in [facility] Breast Cancer STAT Panel or Common Hereditary Cancers panel. The report dates are August 11, 2020 and August 19, 2020, respectively. Two variants of uncertain signficance were detected - one in the CTNNA1 gene called c.86del and the second in the MLH1 gene called c.808A>G.   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 The change in variant classification was made as a result of re-review of the evidence in light of new variant interpretation guidelines and/or new information.   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 The amended report date is February 16, 2022.   The STAT  Breast cancer panel offered by [facility] includes sequencing and rearrangement analysis for the following 9 genes:  ATM, BRCA1, BRCA2, CDH1, CHEK2, PALB2, PTEN, STK11 and TP53.  The Common Hereditary Cancers Panel offered by [facility] includes sequencing and/or deletion duplication testing of the following 48 genes: APC, ATM, AXIN2, BARD1, BMPR1A, BRCA1, BRCA2, BRIP1, CDH1, CDK4, CDKN2A (p14ARF), CDKN2A (p16INK4a), CHEK2, CTNNA1, DICER1, EPCAM (Deletion/duplication testing only), GREM1 (promoter region deletion/duplication testing only), KIT, MEN1, MLH1, MSH2, MSH3, MSH6, MUTYH, NBN, NF1, NTHL1, PALB2, PDGFRA, PMS2, POLD1, POLE, PTEN, RAD50, RAD51C, RAD51D, RNF43, SDHB, SDHC, SDHD, SMAD4, SMARCA4. STK11, TP53, TSC1, TSC2, and VHL.  The following genes were evaluated for sequence changes only: SDHA and HOXB13 c.251G>A variant only.    09/01/2020 - 01/11/2021 Neo-Adjuvant Chemotherapy   Adriamycin  and Cytoxan  x4 09/01/2020-10/12/2020 Weekly Taxol  x 12  10/26/2020-01/11/2021(dose reduced d/t AE)   02/07/2021 Surgery   Right lumpectomy Jan): invasive and in situ ductal carcinoma, 2.8cm, clear margins, with metastatic carcinoma in 1/1 right axillary lymph nodes.   03/28/2021 Surgery   Axillary lymph node dissection: 3/7 lymph nodes +   05/10/2021 - 07/01/2021 Radiation Therapy   Site Technique Total Dose (Gy) Dose per Fx (Gy) Completed Fx Beam Energies  Breast, Right: Breast_Rt 3D 50.4/50.4 1.8 28/28 10X  Breast, Right: Breast_Rt_Bst specialPort 12/12 2 6/6 15E  Sclav-RT: SCV_Rt 3D 50.4/50.4 1.8 28/28      07/2021 -  Anti-estrogen oral therapy   Zoladex  + Letrozole  + Verzenio      CHIEF COMPLIANT: Skin rash on the back, follow-up on Verzenio   HISTORY OF PRESENT ILLNESS: History of Present Illness   Melody Rodriguez is a 48 year old female who presents with  a new skin rash and leg pain.  She developed a new skin rash less than a month after starting a new medication. The rash initially appeared like a  blackhead, which she attempted to squeeze, resulting in a scratch-like appearance. She is concerned it may be shingles. She experiences pain in her leg and groin area, primarily on the right side, which she associates with the rash. Her friend noticed swelling in her leg, which she manages by using a pregnancy pillow and a wedge pillow to reduce swelling.  She reports chronic diarrhea, occurring once or twice a day, and mentions a recent episode of constipation followed by a day of increased bowel movements.  She describes an episode of extreme fatigue on Monday, where she slept all day and experienced difficulty breathing and congestion, followed by waking up with wet hands and feeling better the next day.  She is currently taking Valtrex  twice a day for the rash and has been instructed to increase the dose of another medication to 100 mg per day.         ALLERGIES:  is allergic to duloxetine , keflex [cephalexin], tramadol , latex, and naproxen.  MEDICATIONS:  Current Outpatient Medications  Medication Sig Dispense Refill   valACYclovir  (VALTREX ) 1000 MG tablet Take 1 tablet (1,000 mg total) by mouth 2 (two) times daily. 14 tablet 0   abemaciclib  (VERZENIO ) 100 MG tablet Take 1 tablet (100 mg total) by mouth 2 (two) times daily. 60 tablet 1   ACCU-CHEK GUIDE test strip USE TO MONITOR BLOOD GLUCOSE 3 TIME(S) DAILY     baclofen  (LIORESAL ) 10 MG tablet Take 1 tablet (10 mg total) by mouth 3 (three) times daily. 60 each 2   CREON 24000-76000 units CPEP Take by mouth. Take 2 capsules by mouth 3 times daily with meals. Take 2 capsules with meals and 1 with snacks     glipiZIDE  (GLUCOTROL  XL) 2.5 MG 24 hr tablet Take 1 tablet (2.5 mg total) by mouth daily.This replaces the 5 mg. 30 tablet 0   Glucose Blood (BLOOD GLUCOSE TEST STRIPS) STRP Use to check blood sugar as directed 100 each 5   HYDROmorphone  (DILAUDID ) 2 MG tablet Take 1 tablet (2 mg total) by mouth every 6 (six) hours as needed for severe  pain (pain score 7-10). 60 tablet 0   letrozole  (FEMARA ) 2.5 MG tablet Take 1 tablet (2.5 mg total) by mouth daily. 90 tablet 3   ondansetron  (ZOFRAN -ODT) 8 MG disintegrating tablet Take 1 tablet (8 mg total) by mouth every 8 (eight) hours as needed for nausea or vomiting. 60 tablet 4   pantoprazole  (PROTONIX ) 40 MG tablet Take 1 tablet (40 mg total) by mouth daily. 30 tablet 11   pregabalin  (LYRICA ) 100 MG capsule Take 1 capsule (100 mg total) by mouth 2 (two) times daily. 60 capsule 1   sucralfate  (CARAFATE ) 1 GM/10ML suspension Take 10 mLs (1 g total) by mouth 4 (four) times daily before meals and nightly 1200 mL 11   triamcinolone  cream (KENALOG ) 0.1 % Apply 1 Application topically 2 (two) times daily as needed. 40 g 1   venlafaxine  XR (EFFEXOR -XR) 75 MG 24 hr capsule Take 1 capsule (75 mg total) by mouth daily. 30 capsule 2   Current Facility-Administered Medications  Medication Dose Route Frequency Provider Last Rate Last Admin   0.9 %  sodium chloride  infusion  500 mL Intravenous Continuous Federico Rosario BROCKS, MD        PHYSICAL EXAMINATION: ECOG PERFORMANCE STATUS: 1 -  Symptomatic but completely ambulatory  Vitals:   10/24/23 1019  BP: 134/72  Pulse: 63  Resp: 19  Temp: 98.4 F (36.9 C)   Filed Weights   10/24/23 1019  Weight: 167 lb 9.6 oz (76 kg)     LABORATORY DATA:  I have reviewed the data as listed    Latest Ref Rng & Units 10/24/2023    9:41 AM 10/02/2023    9:48 AM 06/08/2023    7:49 PM  CMP  Glucose 70 - 99 mg/dL 85  59  855   BUN 6 - 20 mg/dL 12  12  36   Creatinine 0.44 - 1.00 mg/dL 9.09  9.33  9.12   Sodium 135 - 145 mmol/L 144  144  142   Potassium 3.5 - 5.1 mmol/L 3.3  3.5  3.6   Chloride 98 - 111 mmol/L 108  109  100   CO2 22 - 32 mmol/L 31  30  31    Calcium  8.9 - 10.3 mg/dL 9.2  9.7  9.8   Total Protein 6.5 - 8.1 g/dL 7.0  7.3  8.8   Total Bilirubin 0.0 - 1.2 mg/dL 0.5  0.5  0.8   Alkaline Phos 38 - 126 U/L 74  75  84   AST 15 - 41 U/L 11  20  31     ALT 0 - 44 U/L 11  22  77     Lab Results  Component Value Date   WBC 4.5 10/24/2023   HGB 12.1 10/24/2023   HCT 37.0 10/24/2023   MCV 90.9 10/24/2023   PLT 153 10/24/2023   NEUTROABS 3.0 10/24/2023    ASSESSMENT & PLAN:  Malignant neoplasm of upper-outer quadrant of right breast in female, estrogen receptor positive (HCC) 07/28/2020:Patient palpated a right breast mass for 1-2 years. Mammogram showed a 2.2cm mass at the 11 o'clock position with surrounding calcifications, 6.4cm in total extent, and up to 5 abnormal right axillary lymph nodes. 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   Treatment plan: 1. Neoadjuvant chemotherapy (MammaPrint test High Risk): AC foll by Taxol  completed 01/11/21 2. 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 Adjuvant radiation therapy 05/11/2021-07/04/2021 4. Follow-up adjuvant antiestrogen therapy along with abemaciclib  (patient has total of 4 lymph nodes positive), but she declined because of concern for toxicities URCC 16070: Treatment of refractory nausea 5. ALND 03/28/21: 3/7 LN positive -------------------------------------------------------------------------------------------------------------------------------  Current treatment: Zoladex  plus letrozole  (patient did not want to start Verzenio  because of concern for toxicities).  Zoladex  discontinued status post bilateral salpingo-oophorectomy on 02/22/2023   Patient stopped taking letrozole  until the stomach cancer surgery happens. 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  (CTNNA1 gene mutation) Postop complications: Weight loss 131 pounds, nausea and vomiting   Bone Density: 08/31/21: T Score 1.1 (Normal) Mammograms: 11/13/2022: Benign, density Cat B CT CAP 09/07/2023: Increase in size of the liver lesion 3.9 cm (used to be 1.8 cm), possibly new lesion 0.6 cm  Biopsy at [facility]: Consistent with metastatic breast cancer ER 92%, PR 98%, HER2 0  Treatment Plan: Verzenio  with Letrozole  (started 10/03/2023)    Toxicities: Tolerating the treatment extremely well. Patient has chronic diarrhea which is unchanged. Hypokalemia: Will increase potassium containing foods.  I will increase the dosage of Verzinio to 100 mg p.o. twice daily and follow-up with Norleen in 3 weeks. Patient received her disability and she is super happy about that.  Shingles: I sent her prescription for Valtrex . Return to clinic in 3 weeks to follow-up with Norleen  Orders Placed This Encounter  Procedures   CBC with Differential (Cancer Center Only)    Standing Status:   Future    Expiration Date:   10/23/2024   CMP (Cancer Center only)    Standing Status:   Future    Expiration Date:   10/23/2024   The patient has a good understanding of the overall plan. she agrees with it. she will call with any problems that may develop before the next visit here. Total time spent: 30 mins including face to face time and time spent for planning, charting and co-ordination of care   Viinay K Demar Shad, MD 10/24/23

## 2023-10-24 NOTE — Progress Notes (Signed)
 [facility] CSW Counseling Note  Patient was referred by self. Treatment type: Individual  Presenting Concerns: Patient and/or family reports the following symptoms/concerns:  adjustment to stage IV cancer diagnosis Duration of problem: 1 months; Severity of problem: moderate   Orientation:oriented to person, place, time/date, and situation.   Affect: Appropriate, Congruent, and Tearful Risk of harm to self or others: No plan to harm self or others  Patient and/or Family's Strengths/Protective Factors: Ability for insight  Capable of independent living  Communication skills      Goals Addressed: Patient will:  Increase knowledge and/or ability of: coping skills  Increase healthy adjustment to current life circumstances and process new diagnosis   Progress towards Goals: Progressing   Interventions: Interventions utilized:  Strength-based and Supportive      Assessment: Patient reports ups and downs this week. She has had a few days of feeling poorly and has many items on her to-do list which has been stressful.  Overall, she is remaining calm and at peace with the goal of living for another 4-5 years.  CSW and pt discussed how to separate to-do list into need to do today, in a week, in a month to help with prioritizing. In addition, discussed need to do versus want to do and ways to modify and pace activities. Discussed how learning to approach activities differently will take time and recognizing the changes is beneficial like how pt has recognized progress she has made in learning what she can eat without causing digestive issues.  Pt is planning to tell her kids about diagnosis when her older son visits for her birthday at the end of February.    Plan: Follow up with CSW: 3-4 weeks (pt will reach out once other appts are scheduled to try to coordinate visits) Behavioral recommendations: Continue balancing your activities to prioritize energy going to the things you enjoy and  bring you good quality of life.   Referral(s): [facility]; [facility]; [place]       Rozetta Stumpp E Meyer Dockery, LCSW   Patient is participating in a Managed Medicaid Plan:  Yes

## 2023-10-25 ENCOUNTER — Other Ambulatory Visit: Payer: Self-pay

## 2023-10-25 ENCOUNTER — Other Ambulatory Visit (HOSPITAL_COMMUNITY): Payer: Self-pay

## 2023-10-25 LAB — CANCER ANTIGEN 27.29: CA 27.29: 131.1 U/mL — ABNORMAL HIGH (ref 0.0–38.6)

## 2023-11-01 ENCOUNTER — Encounter: Payer: Self-pay | Admitting: Hematology and Oncology

## 2023-11-01 ENCOUNTER — Other Ambulatory Visit (HOSPITAL_COMMUNITY): Payer: Self-pay

## 2023-11-01 MED ORDER — GLIPIZIDE ER 2.5 MG PO TB24
2.5000 mg | ORAL_TABLET | Freq: Every day | ORAL | 1 refills | Status: AC
  Filled 2023-11-01: qty 30, 30d supply, fill #0
  Filled 2023-12-31: qty 30, 30d supply, fill #1

## 2023-11-03 ENCOUNTER — Other Ambulatory Visit (HOSPITAL_COMMUNITY): Payer: Self-pay

## 2023-11-05 ENCOUNTER — Other Ambulatory Visit (HOSPITAL_COMMUNITY): Payer: Self-pay

## 2023-11-05 ENCOUNTER — Other Ambulatory Visit: Payer: Self-pay

## 2023-11-06 ENCOUNTER — Other Ambulatory Visit: Payer: Self-pay

## 2023-11-12 ENCOUNTER — Encounter: Payer: Self-pay | Admitting: Hematology and Oncology

## 2023-11-14 ENCOUNTER — Inpatient Hospital Stay: Payer: Medicaid Other | Admitting: Pharmacist

## 2023-11-14 ENCOUNTER — Encounter: Payer: Self-pay | Admitting: General Practice

## 2023-11-14 ENCOUNTER — Inpatient Hospital Stay: Payer: Medicaid Other

## 2023-11-14 ENCOUNTER — Other Ambulatory Visit (HOSPITAL_COMMUNITY): Payer: Self-pay

## 2023-11-14 ENCOUNTER — Other Ambulatory Visit: Payer: Self-pay

## 2023-11-14 VITALS — BP 142/72 | HR 64 | Temp 98.3°F | Ht 64.0 in | Wt 160.3 lb

## 2023-11-14 DIAGNOSIS — Z170 Estrogen receptor positive status [ER+]: Secondary | ICD-10-CM

## 2023-11-14 DIAGNOSIS — E876 Hypokalemia: Secondary | ICD-10-CM

## 2023-11-14 DIAGNOSIS — C50411 Malignant neoplasm of upper-outer quadrant of right female breast: Secondary | ICD-10-CM | POA: Diagnosis not present

## 2023-11-14 LAB — CMP (CANCER CENTER ONLY)
ALT: 13 U/L (ref 0–44)
AST: 16 U/L (ref 15–41)
Albumin: 4.4 g/dL (ref 3.5–5.0)
Alkaline Phosphatase: 69 U/L (ref 38–126)
Anion gap: 7 (ref 5–15)
BUN: 14 mg/dL (ref 6–20)
CO2: 29 mmol/L (ref 22–32)
Calcium: 9 mg/dL (ref 8.9–10.3)
Chloride: 108 mmol/L (ref 98–111)
Creatinine: 0.9 mg/dL (ref 0.44–1.00)
GFR, Estimated: >60 mL/min (ref >60)
Glucose, Bld: 108 mg/dL — ABNORMAL HIGH (ref 70–99)
Potassium: 2.9 mmol/L — ABNORMAL LOW (ref 3.5–5.1)
Sodium: 144 mmol/L (ref 135–145)
Total Bilirubin: 0.5 mg/dL (ref 0.0–1.2)
Total Protein: 7.4 g/dL (ref 6.5–8.1)

## 2023-11-14 LAB — CBC WITH DIFFERENTIAL (CANCER CENTER ONLY)
Abs Immature Granulocytes: 0 10*3/uL (ref 0.00–0.07)
Basophils Absolute: 0 10*3/uL (ref 0.0–0.1)
Basophils Relative: 1 %
Eosinophils Absolute: 0 10*3/uL (ref 0.0–0.5)
Eosinophils Relative: 2 %
HCT: 36.3 % (ref 36.0–46.0)
Hemoglobin: 11.9 g/dL — ABNORMAL LOW (ref 12.0–15.0)
Immature Granulocytes: 0 %
Lymphocytes Relative: 59 %
Lymphs Abs: 1.3 10*3/uL (ref 0.7–4.0)
MCH: 30.1 pg (ref 26.0–34.0)
MCHC: 32.8 g/dL (ref 30.0–36.0)
MCV: 91.7 fL (ref 80.0–100.0)
Monocytes Absolute: 0.2 10*3/uL (ref 0.1–1.0)
Monocytes Relative: 8 %
Neutro Abs: 0.6 10*3/uL — ABNORMAL LOW (ref 1.7–7.7)
Neutrophils Relative %: 30 %
Platelet Count: 123 10*3/uL — ABNORMAL LOW (ref 150–400)
RBC: 3.96 MIL/uL (ref 3.87–5.11)
RDW: 14.6 % (ref 11.5–15.5)
WBC Count: 2.2 10*3/uL — ABNORMAL LOW (ref 4.0–10.5)
nRBC: 0 % (ref 0.0–0.2)

## 2023-11-14 MED ORDER — POTASSIUM CHLORIDE 10 MEQ/100ML IV SOLN: 10.0000 meq | INTRAVENOUS | Status: DC

## 2023-11-14 MED ORDER — POTASSIUM CHLORIDE 20 MEQ/15ML (10%) PO SOLN
20.0000 meq | Freq: Two times a day (BID) | ORAL | 0 refills | Status: DC
  Filled 2023-11-14: qty 210, 7d supply, fill #0

## 2023-11-14 MED ORDER — SODIUM CHLORIDE 0.9 % IV SOLN: 40.0000 meq | Freq: Once | INTRAVENOUS | Status: DC

## 2023-11-14 MED ORDER — ABEMACICLIB 50 MG PO TABS
50.0000 mg | ORAL_TABLET | Freq: Two times a day (BID) | ORAL | 0 refills | Status: DC
  Filled 2023-11-14 – 2023-11-21 (×2): qty 56, 28d supply, fill #0

## 2023-11-14 MED ORDER — POTASSIUM CHLORIDE 10 MEQ/100ML IV SOLN
10.0000 meq | INTRAVENOUS | Status: AC
  Administered 2023-11-14 (×2): 10 meq via INTRAVENOUS
  Filled 2023-11-14 (×2): qty 100

## 2023-11-14 MED ORDER — SODIUM CHLORIDE 0.9 % IV SOLN: Freq: Once | INTRAVENOUS | Status: AC

## 2023-11-14 MED ORDER — POTASSIUM CHLORIDE IN NACL 20-0.9 MEQ/L-% IV SOLN
INTRAVENOUS | Status: DC
  Filled 2023-11-14: qty 1000

## 2023-11-14 NOTE — Progress Notes (Signed)
 [facility] Spiritual Care Note  Referred by nursing for additional layer of spiritual and emotional support. Visited in infusion, bringing Melody Rodriguez a handmade blessing blanket and care package of self-care goodies as gestures of encouragement, for which she was very appreciative. She utilized the visit to share and process her fatigue--both emotional and physical--with her long treatment history. She also reports limited support, which, she notes, is particularly difficult in the setting of parenting a 13yo autistic son at home by herself. Her older son lives in [place].  Provided compassionate presence, reflective listening, normalization of feelings, and emotional support. Melody Rodriguez welcomes follow-up visits in infusion, which she prefers over phone calls.     [address] [place], [place], [facility] Pager [phone] Voicemail [phone]

## 2023-11-14 NOTE — Patient Instructions (Signed)
 Potassium Chloride Injection What is this medication? POTASSIUM CHLORIDE (poe TASS i um KLOOR ide) prevents and treats low levels of potassium in your body. Potassium plays an important role in maintaining the health of your kidneys, heart, muscles, and nervous system. This medicine may be used for other purposes; ask your health care provider or pharmacist if you have questions. COMMON BRAND NAME(S): PROAMP What should I tell my care team before I take this medication? They need to know if you have any of these conditions: Addison disease Dehydration Diabetes (high blood sugar) Heart disease High levels of potassium in the blood Irregular heartbeat or rhythm Kidney disease Large areas of burned skin An unusual or allergic reaction to potassium, other medications, foods, dyes, or preservatives Pregnant or trying to get pregnant Breast-feeding How should I use this medication? This medication is injected into a vein. It is given in a hospital or clinic setting. Talk to your care team about the use of this medication in children. Special care may be needed. Overdosage: If you think you have taken too much of this medicine contact a poison control center or emergency room at once. NOTE: This medicine is only for you. Do not share this medicine with others. What if I miss a dose? This does not apply. This medication is not for regular use. What may interact with this medication? Do not take this medication with any of the following: Certain diuretics, such as spironolactone, triamterene Eplerenone Sodium polystyrene sulfonate This medication may also interact with the following: Certain medications for blood pressure or heart disease, such as lisinopril, losartan, quinapril, valsartan Medications that lower your chance of fighting infection, such as cyclosporine, tacrolimus NSAIDs, medications for pain and inflammation, such as ibuprofen or naproxen Other potassium supplements Salt  substitutes This list may not describe all possible interactions. Give your health care provider a list of all the medicines, herbs, non-prescription drugs, or dietary supplements you use. Also tell them if you smoke, drink alcohol, or use illegal drugs. Some items may interact with your medicine. What should I watch for while using this medication? Visit your care team for regular checks on your progress. Tell your care team if your symptoms do not start to get better or if they get worse. You may need blood work while you are taking this medication. Avoid salt substitutes unless you are told otherwise by your care team. What side effects may I notice from receiving this medication? Side effects that you should report to your care team as soon as possible: Allergic reactions--skin rash, itching, hives, swelling of the face, lips, tongue, or throat High potassium level--muscle weakness, fast or irregular heartbeat Side effects that usually do not require medical attention (report to your care team if they continue or are bothersome): Diarrhea Nausea Stomach pain Vomiting This list may not describe all possible side effects. Call your doctor for medical advice about side effects. You may report side effects to FDA at 1-800-FDA-1088. Where should I keep my medication? This medication is given in a hospital or clinic. It will not be stored at home. NOTE: This sheet is a summary. It may not cover all possible information. If you have questions about this medicine, talk to your doctor, pharmacist, or health care provider.  2024 Elsevier/Gold Standard (2022-03-17 00:00:00)

## 2023-11-14 NOTE — Progress Notes (Signed)
  [facility]       Telephone: [phone]?Fax: [phone]   Oncology Clinical Pharmacist Practitioner Progress Note  Melody Rodriguez was contacted via in-person to discuss her chemotherapy regimen for abemaciclib which they receive under the care of Dr. Serena Rodriguez.  Current treatment regimen and start date Abemaciclib (10/03/23) 50 mg BID (start once ANC recovers) -- prescription sent 100 mg BID (10/24/23 - 11/14/23) -- stopped due to ANC 600 50 mg BID (10/03/23) -- starting dose Letrozole (09/25/23)  Interval History She continues on abemaciclib 50 mg by mouth every 12 hours on days 1 to 28 of a 28-day cycle. This is being given in combination with Letrozole . Therapy is planned to continue until disease progression or unacceptable toxicity. She last saw Dr. Pamelia Rodriguez on 10/24/23. At that time he increased the dose of abemaciclib to 100 mg BID. She last saw clinical pharmacy on 10/02/23  Response to Therapy ANC is estimated at 600 cells/uL today. Will hold abemaciclib and repeat labs in one week. Will lower dose back to 50 mg BID. Was increased at last visit with Dr. Pamelia Rodriguez. Potassium is also low today. Patient states she has not been eating as she started feeling sick last week but symptoms improving now. Will give 40 mEq IV today and send oral K liquid (pt preference) to [facility] for 7 days supply.    She will hold abemaciclib and see clinical pharmacy back in one week with repeat labs. Labs, vitals, treatment parameters, and manufacturer guidelines assessing toxicity were reviewed with Melody Rodriguez today. Based on these values, patient is in agreement to HOLD abemaciclib therapy at this time.  Allergies Allergies  Allergen Reactions   Duloxetine Rash   Keflex [Cephalexin] Hives and Rash   Tramadol Other (See Comments)    Makes patient feel "weird"    Latex Itching and Swelling   Naproxen Itching    Vitals    11/14/2023   10:06 AM 10/24/2023   10:19 AM 10/09/2023    8:52  AM  Oncology Vitals  Height 163 cm 163 cm   Weight 72.712 kg 76.023 kg 76.34 kg  Weight (lbs) 160 lbs 5 oz 167 lbs 10 oz 168 lbs 5 oz  BMI 27.52 kg/m2 28.77 kg/m2 28.89 kg/m2  Temp 98.3 F (36.8 C) 98.4 F (36.9 C) 98 F (36.7 C)  Pulse Rate 64 63 81  BP 142/72 134/72 143/91  Resp  19 16  SpO2 100 %  97 %  BSA (m2) 1.81 m2 1.85 m2 1.86 m2    Laboratory Data    Latest Ref Rng & Units 11/14/2023    9:15 AM 10/24/2023    9:41 AM 10/02/2023    9:48 AM  CBC EXTENDED  WBC 4.0 - 10.5 K/uL 2.2  4.5  6.6   RBC 3.87 - 5.11 MIL/uL 3.96  4.07  4.50   Hemoglobin 12.0 - 15.0 g/dL 29.5  62.1  30.8   HCT 36.0 - 46.0 % 36.3  37.0  38.9   Platelets 150 - 400 K/uL 123  153  207   NEUT# 1.7 - 7.7 K/uL 0.6  3.0  3.7   Lymph# 0.7 - 4.0 K/uL 1.3  1.1  2.3        Latest Ref Rng & Units 11/14/2023    9:15 AM 10/24/2023    9:41 AM 10/02/2023    9:48 AM  CMP  Glucose 70 - 99 mg/dL 657  85  59   BUN  6 - 20 mg/dL 14  12  12    Creatinine 0.44 - 1.00 mg/dL 7.82  9.56  2.13   Sodium 135 - 145 mmol/L 144  144  144   Potassium 3.5 - 5.1 mmol/L 2.9  3.3  3.5   Chloride 98 - 111 mmol/L 108  108  109   CO2 22 - 32 mmol/L 29  31  30    Calcium 8.9 - 10.3 mg/dL 9.0  9.2  9.7   Total Protein 6.5 - 8.1 g/dL 7.4  7.0  7.3   Total Bilirubin 0.0 - 1.2 mg/dL 0.5  0.5  0.5   Alkaline Phos 38 - 126 U/L 69  74  75   AST 15 - 41 U/L 16  11  20    ALT 0 - 44 U/L 13  11  22      Adverse Effects Assessment ANC: as above, hold and recheck labs in one week. Lower dose back to 50 mg BID K: as above, 40 mEq IV and will send 20 mEq BID daily PO liquid x 7 days to [facility]. See back on one week with repeat labs  Adherence Assessment Melody Rodriguez reports missing 0 doses over the past 2 weeks due to adherence. Stopped abemaciclib last Thursday when was not feeling well per Dr. Pamelia Rodriguez. She was not interested in going on abx or seeing urgent care [facility] at that time. Afebrile today.   Reason for missed dose: not feeling well Patient  was re-educated on importance of adherence.   Access Assessment Melody Rodriguez is currently receiving her abemaciclib through [facility] concerns:  none  Medication Reconciliation The patient's medication list was reviewed today with the patient? Yes New medications or herbal supplements have recently been started? No  Any medications have been discontinued? No  The medication list was updated and reconciled based on the patient's most recent medication list in the electronic medical record (EMR) including herbal products and OTC medications.   Medications Current Outpatient Medications  Medication Sig Dispense Refill   [START ON 11/21/2023] abemaciclib (VERZENIO) 50 MG tablet Take 1 tablet (50 mg total) by mouth 2 (two) times daily. Swallow tablets whole. Do not chew, crush, or split tablets before swallowing. 56 tablet 0   potassium chloride 20 MEQ/15ML (10%) SOLN Take 15 mLs (20 mEq total) by mouth 2 (two) times daily for 7 days. 210 mL 0   ACCU-CHEK GUIDE test strip USE TO MONITOR BLOOD GLUCOSE 3 TIME(S) DAILY     baclofen (LIORESAL) 10 MG tablet Take 1 tablet (10 mg total) by mouth 3 (three) times daily. 60 each 2   CREON 24000-76000 units CPEP Take by mouth. Take 2 capsules by mouth 3 times daily with meals. Take 2 capsules with meals and 1 with snacks     glipiZIDE (GLUCOTROL XL) 2.5 MG 24 hr tablet Take 1 tablet (2.5 mg total) by mouth daily. 30 tablet 1   Glucose Blood (BLOOD GLUCOSE TEST STRIPS) STRP Use to check blood sugar as directed 100 each 5   HYDROmorphone (DILAUDID) 2 MG tablet Take 1 tablet (2 mg total) by mouth every 6 (six) hours as needed for severe pain (pain score 7-10). 60 tablet 0   letrozole (FEMARA) 2.5 MG tablet Take 1 tablet (2.5 mg total) by mouth daily. 90 tablet 3   ondansetron (ZOFRAN-ODT) 8 MG disintegrating tablet Take 1 tablet (8 mg total) by mouth every 8 (eight) hours as needed for nausea or vomiting.  60 tablet 4    pantoprazole (PROTONIX) 40 MG tablet Take 1 tablet (40 mg total) by mouth daily. 30 tablet 11   pregabalin (LYRICA) 100 MG capsule Take 1 capsule (100 mg total) by mouth 2 (two) times daily. 60 capsule 1   sucralfate (CARAFATE) 1 GM/10ML suspension Take 10 mLs (1 g total) by mouth 4 (four) times daily before meals and nightly 1200 mL 11   triamcinolone cream (KENALOG) 0.1 % Apply 1 Application topically 2 (two) times daily as needed. 40 g 1   valACYclovir (VALTREX) 1000 MG tablet Take 1 tablet (1,000 mg total) by mouth 2 (two) times daily. 14 tablet 0   venlafaxine XR (EFFEXOR-XR) 75 MG 24 hr capsule Take 1 capsule (75 mg total) by mouth daily. 30 capsule 2   Current Facility-Administered Medications  Medication Dose Route Frequency Provider Last Rate Last Admin   0.9 %  sodium chloride infusion  500 mL Intravenous Continuous Imogene Burn, MD        Drug-Drug Interactions (DDIs) DDIs were evaluated? Yes Significant DDIs?  No The patient was instructed to speak with their health care provider and/or the oral chemotherapy pharmacist before starting any new drug, including prescription or over the counter, natural / herbal products, or vitamins.  Supportive Care Diarrhea: we reviewed that diarrhea is common with abemaciclib and confirmed that she does have loperamide (Imodium) at home.  We reviewed how to take this medication PRN. Neutropenia: we discussed the importance of having a thermometer and what the Centers for Disease Control and Prevention (CDC) considers a fever which is 100.65F (38C) or higher.  Gave patient 24/7 triage line to call if any fevers or symptoms. ILD/Pneumonitis: we reviewed potential symptoms including cough, shortness, and fatigue.  VTE: reviewed signs of DVT such as leg swelling, redness, pain, or tenderness and signs of PE such as shortness of breath, rapid or irregular heartbeat, cough, chest pain, or lightheadedness. Reviewed to take the medication every 12 hours  (with food sometimes can be easier on the stomach) and to take it at the same time every day. Hepatotoxicity: WNL Drug interactions with grapefruit products  Dosing Assessment Hepatic adjustments needed? No  Renal adjustments needed? No  Toxicity adjustments needed? Yes  The current dosing regimen is not appropriate to continue at this time.  Follow-Up Plan STOP abemaciclib 100 mg by mouth every 12 hours due to low ANC. Sending 50 mg BID to [facility]. Will start once ANC recovers. Labs in one week Continue letrozole 2.5 mg by mouth daily. Will start tomorrow. 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 Monitor for letrozole side effects.  Monitor for absorption of abemaciclib Potassium low: Take 20 mEq (15 mL) of oral potassium liquid BID for 7 days. Prescription sent to [facility]. Receiving 40 mEq IV today in infusion Dr. Pamelia Rodriguez has ordered CA 27.29 tumor marker which will result next day and go directly to him. Will add labs, pharmacy clinic in one week to recheck potassium, ANC Likely see Dr. Pamelia Rodriguez with labs in 3 weeks once restart abemaciclib at 50 mg BID  Melody Rodriguez in the discussion, expressed understanding, and voiced agreement with the above plan. All questions were answered to her satisfaction. The patient was advised to contact the clinic at [phone] with any questions or concerns prior to her return visit.   I spent 30 minutes assessing and educating the patient.  Emiline Mancebo A. Odetta Pink, PharmD, BCOP, CPP  Mayra Neer  Sakari Alkhatib, RPH-CPP, 11/14/2023  10:24 AM   **Disclaimer: This note was dictated with voice recognition software. Similar sounding words can inadvertently be transcribed and this note may contain transcription errors which may not have been corrected upon publication of note.**

## 2023-11-15 ENCOUNTER — Encounter: Payer: Self-pay | Admitting: Pharmacist

## 2023-11-15 ENCOUNTER — Other Ambulatory Visit (HOSPITAL_COMMUNITY): Payer: Self-pay

## 2023-11-15 ENCOUNTER — Encounter: Payer: Medicare Other | Attending: Physical Medicine & Rehabilitation | Admitting: Physical Medicine & Rehabilitation

## 2023-11-15 ENCOUNTER — Ambulatory Visit
Admission: RE | Admit: 2023-11-15 | Discharge: 2023-11-15 | Disposition: A | Discharge status: Home / Self Care | Payer: Medicare Other | Source: Ambulatory Visit | Attending: Hematology and Oncology | Admitting: Hematology and Oncology

## 2023-11-15 ENCOUNTER — Encounter: Payer: Self-pay | Admitting: Physical Medicine & Rehabilitation

## 2023-11-15 ENCOUNTER — Other Ambulatory Visit: Payer: Self-pay

## 2023-11-15 ENCOUNTER — Other Ambulatory Visit: Payer: Self-pay | Admitting: Pharmacist

## 2023-11-15 VITALS — BP 145/95 | HR 75 | Ht 64.0 in | Wt 166.0 lb

## 2023-11-15 DIAGNOSIS — M79601 Pain in right arm: Secondary | ICD-10-CM | POA: Insufficient documentation

## 2023-11-15 DIAGNOSIS — G8929 Other chronic pain: Secondary | ICD-10-CM | POA: Diagnosis present

## 2023-11-15 DIAGNOSIS — Z170 Estrogen receptor positive status [ER+]: Secondary | ICD-10-CM

## 2023-11-15 DIAGNOSIS — F0630 Mood disorder due to known physiological condition, unspecified: Secondary | ICD-10-CM | POA: Diagnosis not present

## 2023-11-15 DIAGNOSIS — M7501 Adhesive capsulitis of right shoulder: Secondary | ICD-10-CM | POA: Insufficient documentation

## 2023-11-15 LAB — CANCER ANTIGEN 27.29: CA 27.29: 114.8 U/mL — ABNORMAL HIGH (ref 0.0–38.6)

## 2023-11-15 MED ORDER — POTASSIUM CHLORIDE CRYS ER 20 MEQ PO TBCR
20.0000 meq | EXTENDED_RELEASE_TABLET | Freq: Two times a day (BID) | ORAL | 0 refills | Status: DC
  Filled 2023-11-15: qty 14, 7d supply, fill #0

## 2023-11-15 NOTE — Progress Notes (Signed)
 Subjective:    Patient ID: Melody Rodriguez, female    DOB: 1976-01-22, 48 y.o.   MRN: [id number]  HPI   HPI 33/24 48 year old female with past medical history of diabetes mellitus, breast cancer status post lumpectomy with radioactive seeds 02/07/2021, breast biopsy 02/18/2021 and node dissection 03/28/2021.  Patient reports she began having pain in her right upper extremity after her surgeries for treatment of breast cancer.  She developed pain in her shoulder and the pain spread to her entire arm.  Her arm is very sensitive to touch and has increased swelling.  She reports it has appeared red and swollen at times.  She has difficulty flexing or abducting her shoulder.  She keeps the arm by her side with elbow flexed and wrist flexed.  She had worked with PT although she had to stop it due to pain.  She reports the function in her right arm improved after the physical therapy.  She also has pain when she looks to the right with her head.  She cannot sleep on her right side and uses pillows to avoid sensation to her right arm.  Mobic does not provide significant benefit, 2x shoulder injections did not help the pain.  She reports her mood is decreased due to the pain.   Visit 06/19/22 Melody Rodriguez is here for follow-up of her right upper extremity pain.  She reports pain is roughly similar to her last visit.  She is able to reach her arm a little bit higher to get things on shelves at home.  She still cannot lift above her shoulder level on her right.  She continues to have burning, tingling and stabbing pains in her right upper back, shoulder, upper arm, elbow and sometimes hand and wrist.  She is trying to use her arm for more activities at home however says this is difficult.  She is not interested in physical therapy at this time.  Patient reports she is taking gabapentin 100 mg 3 times a day.  She says this is causing her some mild sedation.     Interval History 10/03/22 Melody Rodriguez is here for follow-up  of her chronic pain in her right upper extremity.  She continues to have pain in her arm, shoulder and around lats on her right side.  She reports Lyrica did improve the pain a little bit.  She is not having any significant side effects with the medication.  She continues to have difficulty using her upper extremity especially for activities where her shoulder is abducted.  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  She has a visit with oncology to discuss treatment options for this cancer.   Interval History 12/04/22 Melody Rodriguez is here for her chronic pain follow-up.  She reports pain is largely unchanged from last visit.  She is not having any side effects with the Lyrica.  She continues to have pain and weakness in her shoulder.  She is not interested in injections at this time.  She would not like to retry physical therapy.  She also does not want to try any new medications, although prior medication fills have been about $4, she says multiple medications would be too costly for her at this time.  She also feels like she has tried enough medications for the time being.  We discussed trying a higher dose of Lyrica, she says she will contact me when she is out of her current medication and consider  an increased dose.  She reports decreased mood however no SI or HI.   Interval History 02/05/23 Pain is largely unchanged from last visit.  She continues to have pain throughout her right upper extremity.  We discussed trying a higher dose of Lyrica as this does provide some benefit, patient is in agreement.  Does not have any significant side effects with the medication currently.  She is currently using CBD products for her pain.  We discussed considering tramadol, she would like to hold off at this time.  She also acknowledges that CBD products may possibly contain THC which would not be distinguishable on UDS from marijuana use.    Interval history 05/18/2023 Melody Rodriguez is here  for follow-up regarding her chronic pain.  It appears that she had robotic assisted laparoscopic bilateral salpingo-oophorectomy on 02/22/2023.  Currently she is still recovering from abdominal surgery that was completed on 05/03/2024 at [facility] that included jejunostomy, gastrectomy, esophagoenterostomy due to gastric singlet ring cell adenocarcinoma.  She is still having some pain in the area of her surgery and is having some issues with nausea at times.  She is getting liquid medications for the time being.  He is currently taking oxycodone which is helping with her pain.  She continues to take Lyrica and she says this also helps with abdominal pain.  She continues to have pain in her right arm and shoulder largely unchanged from last visit.  She continues to have limited range of motion in her right shoulder.  Interval history 11/15/2023 Patient reports continued pain unchanged from prior visits.  She is on treatment for cancer in her liver, currently on Dilaudid by oncology.  She is not interested in any other procedures or treatments at this time.    Pain Inventory Average Pain 8 Pain Right Now 7 My pain is constant, sharp, burning, dull, and aching  In the last 24 hours, has pain interfered with the following? General activity 8 Relation with others 7 Enjoyment of life 10 What TIME of day is your pain at its worst? morning , daytime, evening, and night Sleep (in general) Fair  Pain is worse with: walking, bending, sitting, inactivity, standing, and some activites Pain improves with: medication Relief from Meds: 2  Family History  Problem Relation Age of Onset   Hypertension Mother    Colon cancer Sister 19   Cancer Maternal Aunt        unknown type dx late 71s   Diabetes Maternal Grandmother    Breast cancer Neg Hx    Ovarian cancer Neg Hx    Endometrial cancer Neg Hx    Pancreatic cancer Neg Hx    Prostate cancer Neg Hx    Esophageal cancer Neg Hx    Stomach  cancer Neg Hx    Social History   Socioeconomic History   Marital status: Single    Spouse name: Not on file   Number of children: 2   Years of education: Not on file   Highest education level: Some college, no degree  Occupational History   Occupation: unemployed  Tobacco Use   Smoking status: Former    Current packs/day: 1.00    Average packs/day: 1 pack/day for 22.0 years (22.0 ttl pk-yrs)    Types: Cigars, Cigarettes   Smokeless tobacco: Never   Tobacco comments:    quit 07/2020  Vaping Use   Vaping status: Never Used  Substance and Sexual Activity   Alcohol use: Not Currently  Comment: socially   Drug use: Not Currently    Types: Marijuana    Comment: daily   Sexual activity: Not Currently    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  Other Topics Concern   Not on file  Social History Narrative   Not on file   Social Drivers of Health   Financial Resource Strain: High Risk (06/27/2023)   Received from [facility]   Overall Financial Resource Strain (CARDIA)    Difficulty of Paying Living Expenses: Hard  Food Insecurity: Food Insecurity Present (10/04/2023)   Hunger Vital Sign    Worried About Running Out of Food in the Last Year: Often true    Ran Out of Food in the Last Year: Sometimes true  Transportation Needs: No Transportation Needs (06/27/2023)   Received from [facility] - Transportation    In the past 12 months, has lack of transportation kept you from medical appointments or from getting medications?: No    Lack of Transportation (Non-Medical): No  Recent Concern: Transportation Needs - Unmet Transportation Needs (06/11/2023)   Received from [facility] - Transportation    In the past 12 months, has lack of transportation kept you from medical appointments or from getting medications?: Yes    Lack of Transportation (Non-Medical): Yes  Physical Activity: Inactive (06/08/2023)    Received from [facility]   Exercise Vital Sign    Days of Exercise per Week: 0 days    Minutes of Exercise per Session: 0 min  Stress: Stress Concern Present (06/08/2023)   Received from [facility] - Occupational Stress Questionnaire    Feeling of Stress : Very much  Social Connections: Socially Isolated (06/08/2023)   Received from [facility]   Social Connection and Isolation Panel [NHANES]    Frequency of Communication with Friends and Family: Never    Frequency of Social Gatherings with Friends and Family: Never    Attends Religious Services: Never    Database administrator or Organizations: No    Attends Engineer, structural: Never    Marital Status: Never married   Past Surgical History:  Procedure Laterality Date   BREAST BIOPSY Right 02/18/2021   Procedure: EVACUATION HEMATOMA RIGHT AXILLA;  Surgeon: Abigail Miyamoto, MD;  Location: [facility];  Service: General;  Laterality: Right;   BREAST CYST EXCISION Right    Patient does not recall (2014 or 2015)   BREAST LUMPECTOMY WITH RADIOACTIVE SEED AND SENTINEL LYMPH NODE BIOPSY Right 02/07/2021   Procedure: RIGHT BREAST LUMPECTOMY WITH RADIOACTIVE SEED AND SEED TARGETED LYMPH NODE BIOPSY AND SENTINEL LYMPH NODE BIOPSY;  Surgeon: Abigail Miyamoto, MD;  Location: [facility];  Service: General;  Laterality: Right;   CESAREAN SECTION     x2   COLONOSCOPY     IR IMAGING GUIDED PORT INSERTION  09/15/2020   IR REMOVAL TUN CV CATH W/O FL  09/15/2020   NODE DISSECTION Right 03/28/2021   Procedure: RIGHT AXILLARY LYMPH NODE DISSECTION;  Surgeon: Abigail Miyamoto, MD;  Location: [facility];  Service: General;  Laterality: Right;   PORT-A-CATH REMOVAL Left 02/07/2021   Procedure: REMOVAL PORT-A-CATH;  Surgeon: Abigail Miyamoto, MD;  Location: [facility];  Service:  General;  Laterality: Left;   ROBOTIC ASSISTED BILATERAL SALPINGO OOPHERECTOMY Bilateral 02/22/2023   Procedure: XI ROBOTIC ASSISTED LAPAROSCOPIC BILATERAL  SALPINGO OOPHORECTOMY;  Surgeon: Carver Fila, MD;  Location: [facility];  Service: Gynecology;  Laterality: Bilateral;   THYROIDECTOMY, PARTIAL     UPPER GASTROINTESTINAL ENDOSCOPY     Past Surgical History:  Procedure Laterality Date   BREAST BIOPSY Right 02/18/2021   Procedure: EVACUATION HEMATOMA RIGHT AXILLA;  Surgeon: Abigail Miyamoto, MD;  Location: [facility];  Service: General;  Laterality: Right;   BREAST CYST EXCISION Right    Patient does not recall (2014 or 2015)   BREAST LUMPECTOMY WITH RADIOACTIVE SEED AND SENTINEL LYMPH NODE BIOPSY Right 02/07/2021   Procedure: RIGHT BREAST LUMPECTOMY WITH RADIOACTIVE SEED AND SEED TARGETED LYMPH NODE BIOPSY AND SENTINEL LYMPH NODE BIOPSY;  Surgeon: Abigail Miyamoto, MD;  Location: [facility];  Service: General;  Laterality: Right;   CESAREAN SECTION     x2   COLONOSCOPY     IR IMAGING GUIDED PORT INSERTION  09/15/2020   IR REMOVAL TUN CV CATH W/O FL  09/15/2020   NODE DISSECTION Right 03/28/2021   Procedure: RIGHT AXILLARY LYMPH NODE DISSECTION;  Surgeon: Abigail Miyamoto, MD;  Location: [facility];  Service: General;  Laterality: Right;   PORT-A-CATH REMOVAL Left 02/07/2021   Procedure: REMOVAL PORT-A-CATH;  Surgeon: Abigail Miyamoto, MD;  Location: [facility];  Service: General;  Laterality: Left;   ROBOTIC ASSISTED BILATERAL SALPINGO OOPHERECTOMY Bilateral 02/22/2023   Procedure: XI ROBOTIC ASSISTED LAPAROSCOPIC BILATERAL SALPINGO OOPHORECTOMY;  Surgeon: Carver Fila, MD;  Location: [facility];  Service: Gynecology;  Laterality: Bilateral;   THYROIDECTOMY, PARTIAL     UPPER GASTROINTESTINAL ENDOSCOPY     Past Medical History:  Diagnosis Date   Allergy    Anemia    Anxiety    Arthritis    Breast cancer (HCC)     right breast cancer   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/18/2022   Stomach cancer   Depression    Diabetes mellitus without complication (HCC)    glipizide and actps; cbgs 200s fasting   Family history of colon cancer    GERD (gastroesophageal reflux disease)    Headache    History of goiter    History of radiation therapy    right breast/scv  05/10/21-07/01/21  Dr Antony Blackbird   Hyperlipidemia    Hypertension    Monoallelic mutation of CTNNA3 gene    Recurrent major depressive disorder (HCC)    BP (!) 145/95   Pulse 75   Ht 5\' 4"  (1.626 m)   Wt 166 lb (75.3 kg)   SpO2 96%   BMI 28.49 kg/m   Opioid Risk Score:   Fall Risk Score:  `1  Depression screen PHQ 2/9     05/18/2023    1:54 PM 02/05/2023    9:28 AM 10/03/2022    9:35 AM 07/31/2022   10:36 AM 06/19/2022   10:46 AM 05/23/2022    9:38 AM 04/13/2022   10:11 AM  Depression screen PHQ 2/9  Decreased Interest 1 0 3 3 3  0 1  Down, Depressed, Hopeless 1 0 3 3 3  0 1  PHQ - 2 Score 2 0 6 6 6  0 2    Review of Systems  Gastrointestinal:  Positive for abdominal pain.  Musculoskeletal:  Positive for back pain.       Right arm pain, right shoulder pain down to right hand  All other systems reviewed and are negative.      Objective:   Physical Exam  Gen: no distress, normal appearing HEENT: oral mucosa pink and moist, NCAT Chest: normal effort, normal rate of breathing Ext: no edema Psych: Affect flat today   Skin: intact Neuro: Alert and oriented, sensation intact in all 4 extremities, DTR normal and symmetric Musculoskeletal:  Strength 5/5 in LUE and B/L LE Strength at least 4/5 RUE limited by pain Limited active range of motion R shoulder-unchanged Pain to palpation throughout RUE  Mild swelling in right upper extremity compared to left upper extremity Spurlings negative She continues to have allodynia throughout her right upper arm Reports decreased sensation around her right shoulder to light  touch     MRI shoulder R IMPRESSION: 1. Mild distal infraspinatus tendinosis. No evidence of rotator cuff tear. No evidence of labral tear. 2. Faint intramuscular edema within teres minor, consistent with low-grade muscle strain. 3. Mild soft tissue edema within the right axilla compatible with history of prior axillary lymph node dissection.       Assessment & Plan:     Right upper extremity pain. Suspicious for CPRS versus neuropathic pain related to cancer treatment, diagnosis complicated by edema due to lymph node dissection. Suspect she also has component of frozen shoulder although this by itself wouldn't explain the entirely of her diffuse pain throughout her RUE.  Prior MRI shows infraspinatus tendinitis -Duloxetine 30 mg stopped previously due to rash -It appears she is started on Lyrica by oncology-she denies benefit with this medication-she says she used it a couple weeks -Prior use of meloxicam -She is not interested in physical therapy -Triple phase bone scan was negative -She is currently on Dilaudid by oncology -Discussed option to try medication like venlafaxine, appears this was ordered for her by different provider-not sure that she tried -Discussed option for sympathetic nerve block patient declines again today -Previously discussed TENS unit-hold off today due to oncology issues with -Patient reports she has discontinued use of any CBD products -Discussed possible referral to Ortho surgery for possible intervention for frozen shoulder, she declines at this time -Patient not interested in any interventions at this time, she would like to follow-up with oncology for her pain at this time.  Will follow-up as needed if anything changes   Mood disorder, Sleep disorder -Denies SI or HI -She had a rash with duloxetine.  Appears she was ordered venlafaxine-she is not sure if she tried it

## 2023-11-16 ENCOUNTER — Ambulatory Visit: Payer: Medicaid Other | Admitting: Physical Medicine & Rehabilitation

## 2023-11-21 ENCOUNTER — Inpatient Hospital Stay: Payer: Medicare Other | Attending: Hematology and Oncology

## 2023-11-21 ENCOUNTER — Other Ambulatory Visit: Payer: Self-pay

## 2023-11-21 ENCOUNTER — Other Ambulatory Visit (HOSPITAL_COMMUNITY): Payer: Self-pay

## 2023-11-21 ENCOUNTER — Inpatient Hospital Stay: Payer: Medicare Other | Admitting: Pharmacist

## 2023-11-21 ENCOUNTER — Other Ambulatory Visit: Payer: Self-pay | Admitting: Nurse Practitioner

## 2023-11-21 VITALS — BP 139/83 | HR 68 | Temp 97.6°F | Resp 18 | Ht 64.0 in | Wt 164.0 lb

## 2023-11-21 DIAGNOSIS — Z79811 Long term (current) use of aromatase inhibitors: Secondary | ICD-10-CM | POA: Diagnosis not present

## 2023-11-21 DIAGNOSIS — Z515 Encounter for palliative care: Secondary | ICD-10-CM

## 2023-11-21 DIAGNOSIS — Z923 Personal history of irradiation: Secondary | ICD-10-CM | POA: Insufficient documentation

## 2023-11-21 DIAGNOSIS — C787 Secondary malignant neoplasm of liver and intrahepatic bile duct: Secondary | ICD-10-CM | POA: Insufficient documentation

## 2023-11-21 DIAGNOSIS — Z170 Estrogen receptor positive status [ER+]: Secondary | ICD-10-CM | POA: Diagnosis not present

## 2023-11-21 DIAGNOSIS — Z1721 Progesterone receptor positive status: Secondary | ICD-10-CM | POA: Diagnosis not present

## 2023-11-21 DIAGNOSIS — C50411 Malignant neoplasm of upper-outer quadrant of right female breast: Secondary | ICD-10-CM | POA: Diagnosis present

## 2023-11-21 DIAGNOSIS — Z79899 Other long term (current) drug therapy: Secondary | ICD-10-CM | POA: Diagnosis not present

## 2023-11-21 DIAGNOSIS — E876 Hypokalemia: Secondary | ICD-10-CM | POA: Diagnosis not present

## 2023-11-21 DIAGNOSIS — C773 Secondary and unspecified malignant neoplasm of axilla and upper limb lymph nodes: Secondary | ICD-10-CM | POA: Diagnosis present

## 2023-11-21 DIAGNOSIS — G893 Neoplasm related pain (acute) (chronic): Secondary | ICD-10-CM

## 2023-11-21 DIAGNOSIS — Z1732 Human epidermal growth factor receptor 2 negative status: Secondary | ICD-10-CM | POA: Diagnosis not present

## 2023-11-21 DIAGNOSIS — Z79624 Long term (current) use of inhibitors of nucleotide synthesis: Secondary | ICD-10-CM | POA: Insufficient documentation

## 2023-11-21 LAB — CBC WITH DIFFERENTIAL (CANCER CENTER ONLY)
Abs Immature Granulocytes: 0.01 10*3/uL (ref 0.00–0.07)
Basophils Absolute: 0.1 10*3/uL (ref 0.0–0.1)
Basophils Relative: 1 %
Eosinophils Absolute: 0.1 10*3/uL (ref 0.0–0.5)
Eosinophils Relative: 2 %
HCT: 35.5 % — ABNORMAL LOW (ref 36.0–46.0)
Hemoglobin: 12 g/dL (ref 12.0–15.0)
Immature Granulocytes: 0 %
Lymphocytes Relative: 43 %
Lymphs Abs: 1.6 10*3/uL (ref 0.7–4.0)
MCH: 30.4 pg (ref 26.0–34.0)
MCHC: 33.8 g/dL (ref 30.0–36.0)
MCV: 89.9 fL (ref 80.0–100.0)
Monocytes Absolute: 0.2 10*3/uL (ref 0.1–1.0)
Monocytes Relative: 7 %
Neutro Abs: 1.8 10*3/uL (ref 1.7–7.7)
Neutrophils Relative %: 47 %
Platelet Count: 202 10*3/uL (ref 150–400)
RBC: 3.95 MIL/uL (ref 3.87–5.11)
RDW: 14 % (ref 11.5–15.5)
WBC Count: 3.7 10*3/uL — ABNORMAL LOW (ref 4.0–10.5)
nRBC: 0 % (ref 0.0–0.2)

## 2023-11-21 LAB — CMP (CANCER CENTER ONLY)
ALT: 10 U/L (ref 0–44)
AST: 12 U/L — ABNORMAL LOW (ref 15–41)
Albumin: 4.1 g/dL (ref 3.5–5.0)
Alkaline Phosphatase: 66 U/L (ref 38–126)
Anion gap: 5 (ref 5–15)
BUN: 8 mg/dL (ref 6–20)
CO2: 28 mmol/L (ref 22–32)
Calcium: 8.8 mg/dL — ABNORMAL LOW (ref 8.9–10.3)
Chloride: 110 mmol/L (ref 98–111)
Creatinine: 0.62 mg/dL (ref 0.44–1.00)
GFR, Estimated: >60 mL/min (ref >60)
Glucose, Bld: 92 mg/dL (ref 70–99)
Potassium: 3.8 mmol/L (ref 3.5–5.1)
Sodium: 143 mmol/L (ref 135–145)
Total Bilirubin: 0.4 mg/dL (ref 0.0–1.2)
Total Protein: 6.9 g/dL (ref 6.5–8.1)

## 2023-11-21 NOTE — Progress Notes (Signed)
 [facility]       Telephone: [phone]?Fax: [phone]   Oncology Clinical Pharmacist Practitioner Progress Note  Melody Rodriguez was contacted via in-person to discuss her chemotherapy regimen for abemaciclib which they receive under the care of Dr. Serena Croissant.  Current treatment regimen and start date Abemaciclib (10/03/23) 50 mg BID (start once ANC recovers) -- prescription sent -- restart 11/22/23 100 mg BID (10/24/23 - 11/14/23) -- stopped due to ANC 600 50 mg BID (10/03/23) -- starting dose Letrozole (09/25/23)  Interval History She continues to HOLD abemaciclib 100 mg by mouth every 12 hours on days 1 to 28 of a 28-day cycle. This is being given in combination with Letrozole . Therapy is planned to continue until disease progression or unacceptable toxicity. She was seen last week by clinical pharmacy and she has been holding 100 mg BID dose due to neutropenia. We sent a new prescription for 50 mg BID and she will not continue the 100 mg BID dose. She last saw Dr. Pamelia Hoit on 10/24/23. At that time he increased the dose of abemaciclib to 100 mg BID.   Response to Therapy ANC is estimated at 600 cells/uL last week. Held abemaciclib and repeated labs today, ANC now WNL. Lowered dose back to 50 mg BID. Potassium was also low last week. Now WNL. Labs, vitals, treatment parameters, and manufacturer guidelines assessing toxicity were reviewed with Melody Rodriguez today. Based on these values, patient is in agreement to continue abemaciclib therapy at this time at 50 mg BID.  Allergies Allergies  Allergen Reactions   Duloxetine Rash   Keflex [Cephalexin] Hives and Rash   Tramadol Other (See Comments)    Makes patient feel "weird"    Latex Itching and Swelling   Naproxen Itching    Vitals    11/21/2023    9:06 AM 11/15/2023   11:34 AM 11/14/2023    4:20 PM  Oncology Vitals  Height 163 cm 163 cm   Weight 74.39 kg 75.297 kg   Weight (lbs) 164 lbs 166 lbs   BMI 28.15 kg/m2  28.49 kg/m2   Temp 97.6 F (36.4 C)  98.5 F (36.9 C)  Pulse Rate 68 75 69  BP 139/83 145/95 132/88  Resp 18  16  SpO2 99 % 96 %   BSA (m2) 1.83 m2 1.84 m2     Laboratory Data    Latest Ref Rng & Units 11/21/2023    8:27 AM 11/14/2023    9:15 AM 10/24/2023    9:41 AM  CBC EXTENDED  WBC 4.0 - 10.5 K/uL 3.7  2.2  4.5   RBC 3.87 - 5.11 MIL/uL 3.95  3.96  4.07   Hemoglobin 12.0 - 15.0 g/dL 29.5  62.1  30.8   HCT 36.0 - 46.0 % 35.5  36.3  37.0   Platelets 150 - 400 K/uL 202  123  153   NEUT# 1.7 - 7.7 K/uL 1.8  0.6  3.0   Lymph# 0.7 - 4.0 K/uL 1.6  1.3  1.1        Latest Ref Rng & Units 11/21/2023    8:27 AM 11/14/2023    9:15 AM 10/24/2023    9:41 AM  CMP  Glucose 70 - 99 mg/dL 92  657  85   BUN 6 - 20 mg/dL 8  14  12    Creatinine 0.44 - 1.00 mg/dL 8.46  9.62  9.52   Sodium 135 - 145 mmol/L 143  144  144   Potassium 3.5 - 5.1 mmol/L 3.8  2.9  3.3   Chloride 98 - 111 mmol/L 110  108  108   CO2 22 - 32 mmol/L 28  29  31    Calcium 8.9 - 10.3 mg/dL 8.8  9.0  9.2   Total Protein 6.5 - 8.1 g/dL 6.9  7.4  7.0   Total Bilirubin 0.0 - 1.2 mg/dL 0.4  0.5  0.5   Alkaline Phos 38 - 126 U/L 66  69  74   AST 15 - 41 U/L 12  16  11    ALT 0 - 44 U/L 10  13  11      Adverse Effects Assessment ANC: recovered back up to 1800 cells/uL today. Will restart abemaciclib at 50 mg PO BID K: now WNL Ca: monitor  Adherence Assessment Melody Rodriguez reports missing 0 doses over the past 1 weeks due to adherence. Stopped abemaciclib last Thursday when was not feeling well per Dr. Pamelia Hoit. Has held since then since [facility] was low last week Patient was re-educated on importance of adherence.   Access Assessment Melody Rodriguez is currently receiving her abemaciclib through [facility] concerns:  none  Medication Reconciliation The patient's medication list was reviewed today with the patient? Yes New medications or herbal supplements have recently been started? No  Any  medications have been discontinued? No  The medication list was updated and reconciled based on the patient's most recent medication list in the electronic medical record (EMR) including herbal products and OTC medications.   Medications Current Outpatient Medications  Medication Sig Dispense Refill   abemaciclib (VERZENIO) 50 MG tablet Take 1 tablet (50 mg total) by mouth 2 (two) times daily. Swallow tablets whole. Do not chew, crush, or split tablets before swallowing. (Patient not taking: Reported on 11/15/2023) 56 tablet 0   ACCU-CHEK GUIDE test strip USE TO MONITOR BLOOD GLUCOSE 3 TIME(S) DAILY     baclofen (LIORESAL) 10 MG tablet Take 1 tablet (10 mg total) by mouth 3 (three) times daily. 60 each 2   CREON 24000-76000 units CPEP Take by mouth. Take 2 capsules by mouth 3 times daily with meals. Take 2 capsules with meals and 1 with snacks     glipiZIDE (GLUCOTROL XL) 2.5 MG 24 hr tablet Take 1 tablet (2.5 mg total) by mouth daily. 30 tablet 1   Glucose Blood (BLOOD GLUCOSE TEST STRIPS) STRP Use to check blood sugar as directed 100 each 5   HYDROmorphone (DILAUDID) 2 MG tablet Take 1 tablet (2 mg total) by mouth every 6 (six) hours as needed for severe pain (pain score 7-10). 60 tablet 0   letrozole (FEMARA) 2.5 MG tablet Take 1 tablet (2.5 mg total) by mouth daily. 90 tablet 3   ondansetron (ZOFRAN-ODT) 8 MG disintegrating tablet Take 1 tablet (8 mg total) by mouth every 8 (eight) hours as needed for nausea or vomiting. 60 tablet 4   pantoprazole (PROTONIX) 40 MG tablet Take 1 tablet (40 mg total) by mouth daily. 30 tablet 11   potassium chloride SA (KLOR-CON M) 20 MEQ tablet Take 1 tablet (20 mEq total) by mouth 2 (two) times daily for 7 days. 14 tablet 0   pregabalin (LYRICA) 100 MG capsule Take 1 capsule (100 mg total) by mouth 2 (two) times daily. 60 capsule 1   sucralfate (CARAFATE) 1 GM/10ML suspension Take 10 mLs (1 g total) by mouth 4 (four) times daily before meals and nightly 1200  mL  11   triamcinolone cream (KENALOG) 0.1 % Apply 1 Application topically 2 (two) times daily as needed. 40 g 1   valACYclovir (VALTREX) 1000 MG tablet Take 1 tablet (1,000 mg total) by mouth 2 (two) times daily. 14 tablet 0   Current Facility-Administered Medications  Medication Dose Route Frequency Provider Last Rate Last Admin   0.9 %  sodium chloride infusion  500 mL Intravenous Continuous Imogene Burn, MD        Drug-Drug Interactions (DDIs) DDIs were evaluated? Yes Significant DDIs?  No The patient was instructed to speak with their health care provider and/or the oral chemotherapy pharmacist before starting any new drug, including prescription or over the counter, natural / herbal products, or vitamins.  Supportive Care Diarrhea: we reviewed that diarrhea is common with abemaciclib and confirmed that she does have loperamide (Imodium) at home.  We reviewed how to take this medication PRN. Neutropenia: we discussed the importance of having a thermometer and what the Centers for Disease Control and Prevention (CDC) considers a fever which is 100.7F (38C) or higher.  Gave patient 24/7 triage line to call if any fevers or symptoms. ILD/Pneumonitis: we reviewed potential symptoms including cough, shortness, and fatigue.  VTE: reviewed signs of DVT such as leg swelling, redness, pain, or tenderness and signs of PE such as shortness of breath, rapid or irregular heartbeat, cough, chest pain, or lightheadedness. Reviewed to take the medication every 12 hours (with food sometimes can be easier on the stomach) and to take it at the same time every day. Hepatotoxicity: WNL Drug interactions with grapefruit products  Dosing Assessment Hepatic adjustments needed? No  Renal adjustments needed? No  Toxicity adjustments needed? Yes  -- will restart abemaciclib at 50 mg BID and not increase dose in future The current dosing regimen is not appropriate to continue at this time.  Follow-Up  Plan RESTART lower dose of abemaciclib 50 mg by mouth every 12 hours. ANC now WNL Continue letrozole 2.5 mg by mouth daily.  Monitor for absorption of abemaciclib Potassium low last week, now WNL. If low in future, may consider potassium supplement ongoing. Prefers tablets. Liquid not tolerated. Monitor calcium Dr. Pamelia Hoit has ordered CA 27.29 tumor marker which will result next day and go directly to him. Labs, Dr. Pamelia Hoit visit on 12/03/23 Melody Rodriguez can follow up with clinical pharmacy as deemed necessary by Dr. Serena Croissant going forward  Melody Rodriguez participated in the discussion, expressed understanding, and voiced agreement with the above plan. All questions were answered to her satisfaction. The patient was advised to contact the clinic at [phone] with any questions or concerns prior to her return visit.   I spent 30 minutes assessing and educating the patient.  Rikki Trosper A. Odetta Pink, PharmD, BCOP, CPP  Anselm Lis, RPH-CPP, 11/21/2023  9:19 AM   **Disclaimer: This note was dictated with voice recognition software. Similar sounding words can inadvertently be transcribed and this note may contain transcription errors which may not have been corrected upon publication of note.**

## 2023-11-21 NOTE — Progress Notes (Signed)
 Specialty Pharmacy Refill Coordination Note  Melody Rodriguez is a 48 y.o. female contacted today regarding refills of specialty medication(s) Abemaciclib Kathlen Mody)   Patient requested Daryll Drown at [facility] at [place] date: 11/21/23   Medication will be filled on 11/21/23.

## 2023-11-22 ENCOUNTER — Other Ambulatory Visit: Payer: Self-pay

## 2023-11-22 ENCOUNTER — Other Ambulatory Visit (HOSPITAL_COMMUNITY): Payer: Self-pay

## 2023-11-22 LAB — CANCER ANTIGEN 27.29: CA 27.29: 107.8 U/mL — ABNORMAL HIGH (ref 0.0–38.6)

## 2023-11-22 MED ORDER — HYDROMORPHONE HCL 2 MG PO TABS
2.0000 mg | ORAL_TABLET | Freq: Four times a day (QID) | ORAL | 0 refills | Status: DC | PRN
  Filled 2023-11-22: qty 60, 15d supply, fill #0

## 2023-11-28 ENCOUNTER — Inpatient Hospital Stay: Payer: Medicare Other | Admitting: Hematology and Oncology

## 2023-11-28 ENCOUNTER — Inpatient Hospital Stay: Payer: Medicare Other

## 2023-11-28 ENCOUNTER — Other Ambulatory Visit (HOSPITAL_COMMUNITY): Payer: Self-pay

## 2023-11-28 ENCOUNTER — Other Ambulatory Visit: Payer: Self-pay

## 2023-11-28 ENCOUNTER — Inpatient Hospital Stay: Payer: Medicare Other | Admitting: Licensed Clinical Social Worker

## 2023-11-28 MED ORDER — HYDROCORTISONE (PERIANAL) 2.5 % EX CREA
TOPICAL_CREAM | CUTANEOUS | 1 refills | Status: AC
  Filled 2023-11-28: qty 30, 15d supply, fill #0
  Filled 2023-12-06 – 2023-12-10 (×2): qty 30, 15d supply, fill #1

## 2023-11-28 NOTE — Progress Notes (Signed)
 [facility] CSW Counseling Note  Patient was referred by self. Treatment type: Individual  Presenting Concerns: Patient and/or family reports the following symptoms/concerns:  adjustment to stage IV cancer diagnosis Duration of problem: 2 months; Severity of problem: moderate   Orientation:oriented to person, place, time/date, and situation.   Affect: Appropriate and Congruent Risk of harm to self or others: No plan to harm self or others  Patient and/or Family's Strengths/Protective Factors: Ability for insight  Capable of independent living  Communication skills      Goals Addressed: Patient will:  Increase knowledge and/or ability of: coping skills  Increase healthy adjustment to current life circumstances and process new diagnosis   Progress towards Goals: Progressing   Interventions: Interventions utilized:  Strength-based and Supportive      Assessment: Patient processed stressors today regarding finding a new PCP and uncertainty regarding what may happen from a government level in the next few months-years that might impact the programs that allow her to have income and housing.  Patient has also been navigating getting her in-home aide again since insurance change.  Patient was able to speak with her sons about her diagnosis and her wishes that her older son care for her younger son if she dies prior to [place] turning 12.  Patient describes feeling a weight being lifted since that conversation.     Plan: Follow up with CSW: 2 weeks  Behavioral recommendations: Continue balancing your activities to prioritize energy going to the things you enjoy and bring you good quality of life.  Determine which activities are wants vs needs to decide what to do each day Referral(s): [facility]; [place] [address] [facility]; [place]       Aryeh Butterfield E [place], LCSW

## 2023-12-03 ENCOUNTER — Encounter: Payer: Self-pay | Admitting: General Practice

## 2023-12-03 ENCOUNTER — Inpatient Hospital Stay (HOSPITAL_BASED_OUTPATIENT_CLINIC_OR_DEPARTMENT_OTHER): Admitting: Hematology and Oncology

## 2023-12-03 ENCOUNTER — Inpatient Hospital Stay

## 2023-12-03 ENCOUNTER — Telehealth: Payer: Self-pay | Admitting: Hematology and Oncology

## 2023-12-03 ENCOUNTER — Other Ambulatory Visit (HOSPITAL_COMMUNITY): Payer: Self-pay

## 2023-12-03 VITALS — BP 149/76 | HR 66 | Temp 98.3°F | Resp 17 | Ht 64.0 in | Wt 164.3 lb

## 2023-12-03 DIAGNOSIS — C50411 Malignant neoplasm of upper-outer quadrant of right female breast: Secondary | ICD-10-CM

## 2023-12-03 DIAGNOSIS — Z170 Estrogen receptor positive status [ER+]: Secondary | ICD-10-CM | POA: Diagnosis not present

## 2023-12-03 LAB — CMP (CANCER CENTER ONLY)
ALT: 13 U/L (ref 0–44)
AST: 14 U/L — ABNORMAL LOW (ref 15–41)
Albumin: 4.9 g/dL (ref 3.5–5.0)
Alkaline Phosphatase: 88 U/L (ref 38–126)
Anion gap: 8 (ref 5–15)
BUN: 9 mg/dL (ref 6–20)
CO2: 26 mmol/L (ref 22–32)
Calcium: 9.3 mg/dL (ref 8.9–10.3)
Chloride: 110 mmol/L (ref 98–111)
Creatinine: 0.8 mg/dL (ref 0.44–1.00)
GFR, Estimated: >60 mL/min (ref >60)
Glucose, Bld: 88 mg/dL (ref 70–99)
Potassium: 3.8 mmol/L (ref 3.5–5.1)
Sodium: 144 mmol/L (ref 135–145)
Total Bilirubin: 0.4 mg/dL (ref 0.0–1.2)
Total Protein: 8.1 g/dL (ref 6.5–8.1)

## 2023-12-03 LAB — CBC WITH DIFFERENTIAL (CANCER CENTER ONLY)
Abs Immature Granulocytes: 0.01 10*3/uL (ref 0.00–0.07)
Basophils Absolute: 0 10*3/uL (ref 0.0–0.1)
Basophils Relative: 1 %
Eosinophils Absolute: 0.1 10*3/uL (ref 0.0–0.5)
Eosinophils Relative: 3 %
HCT: 41.4 % (ref 36.0–46.0)
Hemoglobin: 13.5 g/dL (ref 12.0–15.0)
Immature Granulocytes: 0 %
Lymphocytes Relative: 37 %
Lymphs Abs: 1.5 10*3/uL (ref 0.7–4.0)
MCH: 30.7 pg (ref 26.0–34.0)
MCHC: 32.6 g/dL (ref 30.0–36.0)
MCV: 94.1 fL (ref 80.0–100.0)
Monocytes Absolute: 0.2 10*3/uL (ref 0.1–1.0)
Monocytes Relative: 4 %
Neutro Abs: 2.3 10*3/uL (ref 1.7–7.7)
Neutrophils Relative %: 55 %
Platelet Count: 196 10*3/uL (ref 150–400)
RBC: 4.4 MIL/uL (ref 3.87–5.11)
RDW: 14.5 % (ref 11.5–15.5)
WBC Count: 4.1 10*3/uL (ref 4.0–10.5)
nRBC: 0 % (ref 0.0–0.2)

## 2023-12-03 MED ORDER — GLIPIZIDE ER 2.5 MG PO TB24
2.5000 mg | ORAL_TABLET | Freq: Every day | ORAL | 2 refills | Status: AC
  Filled 2023-12-03: qty 30, 30d supply, fill #0
  Filled 2024-01-28: qty 30, 30d supply, fill #1
  Filled 2024-02-26: qty 30, 30d supply, fill #2

## 2023-12-03 NOTE — Progress Notes (Signed)
 Patient Care Team: Modesta Messing as PCP - General (Physician Assistant) Abigail Miyamoto, MD as Consulting Physician (General Surgery) Serena Croissant, MD as Consulting Physician (Hematology and Oncology) Antony Blackbird, MD as Consulting Physician (Radiation Oncology) Pickenpack-Cousar, Arty Baumgartner, NP as Nurse Practitioner [facility] and Palliative Medicine)  DIAGNOSIS:  Encounter Diagnosis  Name Primary?   Malignant neoplasm of upper-outer quadrant of right breast in female, estrogen receptor positive (HCC) Yes    SUMMARY OF ONCOLOGIC HISTORY: Oncology History  Malignant neoplasm of upper-outer quadrant of right breast in female, estrogen receptor positive (HCC)  07/28/2020 Initial Diagnosis   Patient palpated a right breast mass for 1-2 years. Mammogram showed a 2.2cm mass at the 11 o'clock position with surrounding calcifications, 6.4cm in total extent, and up to 5 abnormal right axillary lymph nodes. 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    08/05/2020 Miscellaneous   MammaPrint: High risk luminal type B   08/11/2020 Genetic Testing   Negative genetic testing: no pathogenic variants detected in [facility] Breast Cancer STAT Panel or Common Hereditary Cancers panel. The report dates are August 11, 2020 and August 19, 2020, respectively. Two variants of uncertain signficance were detected - one in the CTNNA1 gene called c.86del and the second in the MLH1 gene called c.808A>G.   UPDATE:  The MLH1 c.808A>G VUS was reclassified to "Likely Benign" on 02/19/2021. The change in variant classification was made as a result of re-review of the evidence in light of new variant interpretation guidelines and/or new information.   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 The amended report date is February 16, 2022.   The STAT  Breast cancer panel offered by [facility] includes sequencing and rearrangement analysis for the following 9 genes:  ATM, BRCA1, BRCA2, CDH1, CHEK2, PALB2, PTEN, STK11 and TP53.  The Common Hereditary Cancers Panel offered by [facility] includes sequencing and/or deletion duplication testing of the following 48 genes: APC, ATM, AXIN2, BARD1, BMPR1A, BRCA1, BRCA2, BRIP1, CDH1, CDK4, CDKN2A (p14ARF), CDKN2A (p16INK4a), CHEK2, CTNNA1, DICER1, EPCAM (Deletion/duplication testing only), GREM1 (promoter region deletion/duplication testing only), KIT, MEN1, MLH1, MSH2, MSH3, MSH6, MUTYH, NBN, NF1, NTHL1, PALB2, PDGFRA, PMS2, POLD1, POLE, PTEN, RAD50, RAD51C, RAD51D, RNF43, SDHB, SDHC, SDHD, SMAD4, SMARCA4. STK11, TP53, TSC1, TSC2, and VHL.  The following genes were evaluated for sequence changes only: SDHA and HOXB13 c.251G>A variant only.    09/01/2020 - 01/11/2021 Neo-Adjuvant Chemotherapy   Adriamycin and Cytoxan x4 09/01/2020-10/12/2020 Weekly Taxol x 12  10/26/2020-01/11/2021(dose reduced d/t AE)   02/07/2021 Surgery   Right lumpectomy Magnus Ivan): invasive and in situ ductal carcinoma, 2.8cm, clear margins, with metastatic carcinoma in 1/1 right axillary lymph nodes.   03/28/2021 Surgery   Axillary lymph node dissection: 3/7 lymph nodes +   05/10/2021 - 07/01/2021 Radiation Therapy   Site Technique Total Dose (Gy) Dose per Fx (Gy) Completed Fx Beam Energies  Breast, Right: Breast_Rt 3D 50.4/50.4 1.8 28/28 10X  Breast, Right: Breast_Rt_Bst specialPort 12/12 2 6/6 15E  Sclav-RT: SCV_Rt 3D 50.4/50.4 1.8 28/28      07/2021 -  Anti-estrogen oral therapy   Zoladex + Letrozole + Verzenio     CHIEF COMPLIANT: Follow-up on Verzinio  HISTORY OF PRESENT ILLNESS:  History of Present Illness The patient, on a reduced dose of an unspecified medication, presents with a recent cold and low blood  counts. She reports that her son had a cold, which she subsequently caught. The cold lasted for several days, during which  she experienced nasal congestion. She managed her symptoms with Robitussin and a humidifier, which she found helpful. She also reports forgetting a dose of her medication, but has since resumed her regular schedule.  In addition to her cold, the patient has been dealing with a hard-to-access vein for blood draws and infusions. She expresses a desire to have a port placed to facilitate these procedures. She also mentions a stable weight, despite a recent illness that caused her to drop to 155 pounds. She expresses frustration with the difficulty of maintaining a healthy diet and hydration, but notes that she has been trying to consume more fluids and has purchased a blender and juicer to assist with this.  The patient also discusses her recent approval for disability benefits, expressing relief that she can now focus on her health rather than financial concerns. She mentions ongoing issues with her son's health insurance, expressing frustration with the system.     ALLERGIES:  is allergic to duloxetine, keflex [cephalexin], tramadol, latex, and naproxen.  MEDICATIONS:  Current Outpatient Medications  Medication Sig Dispense Refill   abemaciclib (VERZENIO) 50 MG tablet Take 1 tablet (50 mg total) by mouth 2 (two) times daily. Swallow tablets whole. Do not chew, crush, or split tablets before swallowing. (Patient not taking: Reported on 11/15/2023) 56 tablet 0   ACCU-CHEK GUIDE test strip USE TO MONITOR BLOOD GLUCOSE 3 TIME(S) DAILY     baclofen (LIORESAL) 10 MG tablet Take 1 tablet (10 mg total) by mouth 3 (three) times daily. 60 each 2   CREON 24000-76000 units CPEP Take by mouth. Take 2 capsules by mouth 3 times daily with meals. Take 2 capsules with meals and 1 with snacks     glipiZIDE (GLUCOTROL XL) 2.5 MG 24 hr tablet Take 1 tablet (2.5 mg total) by mouth daily. 30 tablet 1   Glucose Blood (BLOOD GLUCOSE TEST STRIPS) STRP Use to check blood sugar as directed 100 each 5   hydrocortisone  (ANUSOL-HC) 2.5 % rectal cream Apply rectally 2 times daily 30 g 1   HYDROmorphone (DILAUDID) 2 MG tablet Take 1 tablet (2 mg total) by mouth every 6 (six) hours as needed for severe pain (pain score 7-10). 60 tablet 0   letrozole (FEMARA) 2.5 MG tablet Take 1 tablet (2.5 mg total) by mouth daily. 90 tablet 3   ondansetron (ZOFRAN-ODT) 8 MG disintegrating tablet Take 1 tablet (8 mg total) by mouth every 8 (eight) hours as needed for nausea or vomiting. 60 tablet 4   pantoprazole (PROTONIX) 40 MG tablet Take 1 tablet (40 mg total) by mouth daily. 30 tablet 11   potassium chloride SA (KLOR-CON M) 20 MEQ tablet Take 1 tablet (20 mEq total) by mouth 2 (two) times daily for 7 days. 14 tablet 0   pregabalin (LYRICA) 100 MG capsule Take 1 capsule (100 mg total) by mouth 2 (two) times daily. 60 capsule 1   sucralfate (CARAFATE) 1 GM/10ML suspension Take 10 mLs (1 g total) by mouth 4 (four) times daily before meals and nightly 1200 mL 11   triamcinolone cream (KENALOG) 0.1 % Apply 1 Application topically 2 (two) times daily as needed. 40 g 1   valACYclovir (VALTREX) 1000 MG tablet Take 1 tablet (1,000 mg total) by mouth 2 (two) times daily. 14 tablet 0   Current Facility-Administered Medications  Medication Dose Route Frequency Provider Last  Rate Last Admin   0.9 %  sodium chloride infusion  500 mL Intravenous Continuous Imogene Burn, MD        PHYSICAL EXAMINATION: ECOG PERFORMANCE STATUS: 1 - Symptomatic but completely ambulatory  Vitals:   12/03/23 0814  BP: (!) 149/76  Pulse: 66  Resp: 17  Temp: 98.3 F (36.8 C)  SpO2: 100%   Filed Weights   12/03/23 0814  Weight: 164 lb 4.8 oz (74.5 kg)    Physical Exam MEASUREMENTS: Weight- 155.  (exam performed in the presence of a chaperone)  LABORATORY DATA:  I have reviewed the data as listed    Latest Ref Rng & Units 11/21/2023    8:27 AM 11/14/2023    9:15 AM 10/24/2023    9:41 AM  CMP  Glucose 70 - 99 mg/dL 92  119  85   BUN 6 - 20  mg/dL 8  14  12    Creatinine 0.44 - 1.00 mg/dL 1.47  8.29  5.62   Sodium 135 - 145 mmol/L 143  144  144   Potassium 3.5 - 5.1 mmol/L 3.8  2.9  3.3   Chloride 98 - 111 mmol/L 110  108  108   CO2 22 - 32 mmol/L 28  29  31    Calcium 8.9 - 10.3 mg/dL 8.8  9.0  9.2   Total Protein 6.5 - 8.1 g/dL 6.9  7.4  7.0   Total Bilirubin 0.0 - 1.2 mg/dL 0.4  0.5  0.5   Alkaline Phos 38 - 126 U/L 66  69  74   AST 15 - 41 U/L 12  16  11    ALT 0 - 44 U/L 10  13  11      Lab Results  Component Value Date   WBC 4.1 12/03/2023   HGB 13.5 12/03/2023   HCT 41.4 12/03/2023   MCV 94.1 12/03/2023   PLT 196 12/03/2023   NEUTROABS 2.3 12/03/2023    ASSESSMENT & PLAN:  Malignant neoplasm of upper-outer quadrant of right breast in female, estrogen receptor positive (HCC) 07/28/2020:Patient palpated a right breast mass for 1-2 years. Mammogram showed a 2.2cm mass at the 11 o'clock position with surrounding calcifications, 6.4cm in total extent, and up to 5 abnormal right axillary lymph nodes. 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   Treatment plan: 1. Neoadjuvant chemotherapy (MammaPrint test High Risk): AC foll by Taxol completed 01/11/21 2. 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 Adjuvant radiation therapy 05/11/2021-07/04/2021 4. Follow-up adjuvant antiestrogen therapy along with abemaciclib (patient has total of 4 lymph nodes positive), but she declined because of concern for toxicities URCC 16070: Treatment of refractory nausea 5. ALND 03/28/21: 3/7 LN positive -------------------------------------------------------------------------------------------------------------------------------   Current treatment: Zoladex plus letrozole (patient did not want to start Verzenio because of concern for toxicities).  Zoladex discontinued status post bilateral salpingo-oophorectomy on 02/22/2023   Patient stopped taking letrozole until the stomach cancer surgery happens. 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  (CTNNA1 gene mutation) Postop complications: Weight loss 131 pounds, nausea and vomiting  Bone Density: 08/31/21: T Score 1.1 (Normal) Mammograms: 11/13/2022: Benign, density Cat B CT CAP 09/07/2023: Increase in size of the liver lesion 3.9 cm (used to be 1.8 cm), possibly new lesion 0.6 cm  Biopsy at [facility]: Consistent with metastatic breast cancer ER 92%, PR 98%, HER2 0   Treatment Plan: Verzenio with Letrozole (started 10/03/2023), dose reduced to 50 p.o. twice daily 11/22/2023    Toxicities: Neutropenia: With dose reduction neutrophil count has normalized. Patient has chronic diarrhea which is unchanged Hypokalemia: Will increase potassium containing foods.   Will request port placement because she is a very poor IV stick  ------------------------------------- Assessment and Plan Assessment & Plan Malignant neoplasm of upper-outer quadrant of right breast, estrogen receptor positive Neutrophil counts improved to 2.3. Tumor markers decreased from 138 to 107, indicating positive treatment response. Last scan showed tumor shrinkage. Current dose maintained due to improved counts and marker trends. - Continue current medication regimen with adjusted dose. - Monitor blood markers monthly. - Schedule follow-up scan in May. - Plan monthly follow-up visits.  Port placement She requested port placement for easier venous access. Provider agreed to arrange this. - Arrange for port placement in the coming month.  Signet ring cell  carcinoma of the gastric fundus No discussion this encounter, remains relevant in medical history.  Shingles No discussion this encounter, managed with Valacyclovir.  Follow-up Monthly monitoring and management planned. - Schedule monthly follow-up appointments. - Order labs for next month's visit.      Orders Placed This Encounter  Procedures   IR IMAGING GUIDED PORT INSERTION    Standing Status:   Future    Expected Date:   12/17/2023    Expiration Date:   12/02/2024    Reason for Exam (SYMPTOM  OR DIAGNOSIS REQUIRED):   poor IV access    Is the patient pregnant?:   No    Preferred Imaging Location?:   [facility]    Release to patient:   Immediate   CBC with Differential (Cancer Center Only)    Standing Status:   Future    Expiration Date:   12/02/2024   CMP (Cancer Center only)    Standing Status:   Future    Expiration Date:   12/02/2024   CA 27.29    Standing Status:   Future    Expiration Date:   12/02/2024   The patient has a good understanding of the overall plan. she agrees with it. she will call with any problems that may develop before the next visit here. Total time spent: 30 mins including face to face time and time spent for planning, charting and co-ordination of care   Tamsen Meek, MD 12/03/23

## 2023-12-03 NOTE — Assessment & Plan Note (Signed)
 07/28/2020:Patient palpated a right breast mass for 1-2 years. Mammogram showed a 2.2cm mass at the 11 o'clock position with surrounding calcifications, 6.4cm in total extent, and up to 5 abnormal right axillary lymph nodes. 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   Treatment plan: 1. Neoadjuvant chemotherapy (MammaPrint test High Risk): AC foll by Taxol completed 01/11/21 2. 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 Adjuvant radiation therapy 05/11/2021-07/04/2021 4. Follow-up adjuvant antiestrogen therapy along with abemaciclib (patient has total of 4 lymph nodes positive), but she declined because of concern for toxicities URCC 16070: Treatment of refractory nausea 5. ALND 03/28/21: 3/7 LN positive -------------------------------------------------------------------------------------------------------------------------------  Current treatment: Zoladex plus letrozole (patient did not want to start Verzenio because of concern for toxicities).  Zoladex discontinued status post bilateral salpingo-oophorectomy on 02/22/2023   Patient stopped taking letrozole until the stomach cancer surgery happens. 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  (CTNNA1 gene mutation) Postop complications: Weight loss 131 pounds, nausea and vomiting   Bone Density: 08/31/21: T Score 1.1 (Normal) Mammograms: 11/13/2022: Benign, density Cat B CT CAP 09/07/2023: Increase in size of the liver lesion 3.9 cm (used to be 1.8 cm), possibly new lesion 0.6 cm  Biopsy at  [facility]: Consistent with metastatic breast cancer ER 92%, PR 98%, HER2 0   Treatment Plan: Verzenio with Letrozole (started 10/03/2023), dose reduced to 50 p.o. twice daily 11/22/2023    Toxicities: Neutropenia: Patient has chronic diarrhea which is unchanged Hypokalemia: Will increase potassium containing foods.   Marland Kitchen

## 2023-12-03 NOTE — Progress Notes (Signed)
 Palliative Medicine [facility]  [phone] [phone]   Name: Melody Rodriguez Date: 12/03/2023 MRN: [id number]  DOB: 1976-01-05  Patient Care Team: Modesta Messing as PCP - General (Physician Assistant) Abigail Miyamoto, MD as Consulting Physician (General Surgery) Serena Croissant, MD as Consulting Physician (Hematology and Oncology) Antony Blackbird, MD as Consulting Physician (Radiation Oncology) Pickenpack-Cousar, Arty Baumgartner, NP as Nurse Practitioner [facility] and Palliative Medicine)   I connected with Dominga Ferry on 12/03/23 at  1:30 PM EDT by phone and verified that I am speaking with the correct person using two identifiers.   I discussed the limitations, risks, security and privacy concerns of performing an evaluation and management service by telemedicine and the availability of in-person appointments. I also discussed with the patient that there may be a patient responsible charge related to this service. The patient expressed understanding and agreed to proceed.   Other persons participating in the visit and their role in the encounter: n/a   Patient's location: home  Provider's location: [facility]   Chief Complaint: f/u of symptom management   INTERVAL HISTORY: [name] is a 48 y.o. 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  Palliative ask to see for symptom management and goals of care.  SOCIAL HISTORY:     reports that she has quit smoking. Her smoking use included cigars and cigarettes. She has a 22 pack-year smoking history. She has never used smokeless tobacco. She reports that she does not currently use alcohol. She reports that she does not currently use drugs after having used the following drugs: Marijuana.  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  MEDICATIONS:  Current Outpatient Medications  Medication Sig Dispense Refill   abemaciclib (VERZENIO) 50 MG tablet Take 1 tablet (50 mg total) by mouth 2 (two) times daily. Swallow tablets whole. Do not chew, crush, or split tablets before swallowing. (Patient not taking: Reported on 11/15/2023) 56 tablet 0   ACCU-CHEK GUIDE test strip USE TO MONITOR BLOOD GLUCOSE 3 TIME(S) DAILY     baclofen (LIORESAL) 10 MG tablet Take 1 tablet (10 mg total) by mouth 3 (three) times daily. 60 each 2   CREON 24000-76000 units CPEP Take by mouth. Take 2 capsules by mouth 3 times daily with meals. Take 2 capsules with meals and 1 with snacks     glipiZIDE (GLUCOTROL XL) 2.5 MG 24 hr tablet Take 1 tablet (2.5 mg total) by mouth daily. 30 tablet 1   Glucose Blood (BLOOD GLUCOSE  TEST STRIPS) STRP Use to check blood sugar as directed 100 each 5   hydrocortisone (ANUSOL-HC) 2.5 % rectal cream Apply rectally 2 times daily 30 g 1   HYDROmorphone (DILAUDID) 2 MG tablet Take 1 tablet (2 mg total) by mouth every 6 (six) hours as needed for severe pain (pain score 7-10). 60 tablet 0   letrozole (FEMARA) 2.5 MG tablet Take 1 tablet (2.5 mg total) by mouth daily. 90 tablet 3   ondansetron (ZOFRAN-ODT) 8 MG disintegrating tablet Take 1 tablet (8  mg total) by mouth every 8 (eight) hours as needed for nausea or vomiting. 60 tablet 4   pantoprazole (PROTONIX) 40 MG tablet Take 1 tablet (40 mg total) by mouth daily. 30 tablet 11   potassium chloride SA (KLOR-CON M) 20 MEQ tablet Take 1 tablet (20 mEq total) by mouth 2 (two) times daily for 7 days. 14 tablet 0   pregabalin (LYRICA) 100 MG capsule Take 1 capsule (100 mg total) by mouth 2 (two) times daily. 60 capsule 1   sucralfate (CARAFATE) 1 GM/10ML suspension Take 10 mLs (1 g total) by mouth 4 (four) times daily before meals and nightly 1200 mL 11   triamcinolone cream (KENALOG) 0.1 % Apply 1 Application topically 2 (two) times daily as needed. 40 g 1   valACYclovir (VALTREX) 1000 MG tablet Take 1 tablet (1,000 mg total) by mouth 2 (two) times daily. 14 tablet 0   Current Facility-Administered Medications  Medication Dose Route Frequency Provider Last Rate Last Admin   0.9 %  sodium chloride infusion  500 mL Intravenous Continuous Imogene Burn, MD        VITAL SIGNS: There were no vitals taken for this visit. There were no vitals filed for this visit.  Estimated body mass index is 28.2 kg/m as calculated from the following:   Height as of 12/03/23: 5\' 4"  (1.626 m).   Weight as of 12/03/23: 164 lb 4.8 oz (74.5 kg).   PERFORMANCE STATUS (ECOG) : 1 - Symptomatic but completely ambulatory   Physical Exam General: NAD Cardiovascular: regular rate and rhythm Pulmonary: clear ant fields Abdomen: soft, nontender, + bowel sounds Extremities: no edema, no joint deformities Skin: no rashes Neurological:   Discussed the use of AI scribe software for clinical note transcription with the patient, who gave verbal consent to proceed.  IMPRESSION:  [name] presented to clinic today for symptom management follow-up. No acute distress. Denies nausea, vomiting, constipation, or diarrhea. Taking things one day at time. Occasional fatigue however able to remain active. Some days are better  than others. Appetite is good. Her blood sugar felt to be dropping. Snacks were provided. Reports she is feeling better.  She was seen by Dr. Pamelia Hoit today for follow-up prior to our visit. States her medication dosage for Verzenio was increased from 50 mg to 100 mg.   She developed a rash on her back, initially appearing as a bump, which she pressed, resulting in a discharge. She was seen by Dr. Pamelia Hoit who suspected it to be shingles. He has started her on Valtrex.  Her son took a video of the rash, and she noted that it looks worse over time.  Kalayla reports her pain, and other symptoms are well controlled on current regimen. We discussed her regimen which includes Lyrica, hydromorphone, and baclofen. Tolerating without difficulty. She does endorse back spasms somewhat improved however some days tend to be worse causing limitations in her movement or activity. Advised patient she may increase  baclofen to 1-2 tablets as needed. Education provided on use and safety. She verbalized understanding.   We will continue to closely monitor and support as needed.  I discussed the importance of continued conversation with family and their medical providers regarding overall plan of care and treatment options, ensuring decisions are within the context of the patients values and GOCs.  Assessment and Plan  Shingles Patient reported a rash on her back, which was diagnosed as shingles by Dr. Pamelia Hoit. She was prescribed an antiviral medication. -Encouraged her to pick up her Valtrex medication as prescribed.  Breast Cancer Patient is on Verzenio for breast cancer, which was recently increased to 100mg  by her oncologist. -Continue Verzenio 100mg  as prescribed by oncologist.  Muscle Spasms Patient reported benefit from baclofen for muscle spasms, however some days spasms are worst limiting activity.  -Increase baclofen to 1-2 tablets as needed for muscle spasms. Cautioned patient about potential drowsiness and  advised against driving after taking medication.  General Health Maintenance -Continue current medications and follow up with oncologist as scheduled. -Address financial and social issues related to disability benefits. -She is scheduled to establish support from Social Work team today.  -I will plan to see patient back in 4-6 weeks. Sooner if needed.  Patient expressed understanding and was in agreement with this plan. She also understands that She can call the clinic at any time with any questions, concerns, or complaints.   Any controlled substances utilized were prescribed in the context of palliative care. PDMP has been reviewed.   Visit consisted of counseling and education dealing with the complex and emotionally intense issues of symptom management and palliative care in the setting of serious and potentially life-threatening illness.  Willette Alma, AGPCNP-BC  Palliative Medicine Team/[facility]

## 2023-12-03 NOTE — Progress Notes (Signed)
 [facility] Spiritual Care Note  Learned from [place] in initial contact that she prefers follow-up in infusion, but she doesn't have other treatments scheduled, so chaplain reached out by phone to explore other possibilities. Left follow-up voicemail with direct number and encouragement to return call.    [address] [place], [place], [facility] Pager [phone] Voicemail [phone]

## 2023-12-03 NOTE — Telephone Encounter (Signed)
 Scheduled appointments per 3/17 los. Called and left VM with appointment details.

## 2023-12-04 ENCOUNTER — Inpatient Hospital Stay: Payer: Medicare Other | Admitting: Nurse Practitioner

## 2023-12-04 LAB — CANCER ANTIGEN 27.29: CA 27.29: 126.5 U/mL — ABNORMAL HIGH (ref 0.0–38.6)

## 2023-12-05 ENCOUNTER — Other Ambulatory Visit (HOSPITAL_COMMUNITY): Payer: Self-pay

## 2023-12-06 ENCOUNTER — Other Ambulatory Visit (HOSPITAL_COMMUNITY): Payer: Self-pay

## 2023-12-06 ENCOUNTER — Other Ambulatory Visit: Payer: Self-pay

## 2023-12-06 ENCOUNTER — Other Ambulatory Visit: Payer: Self-pay | Admitting: Hematology and Oncology

## 2023-12-06 MED ORDER — POTASSIUM CHLORIDE CRYS ER 20 MEQ PO TBCR: 20.0000 meq | EXTENDED_RELEASE_TABLET | Freq: Two times a day (BID) | ORAL | 0 refills | Status: AC

## 2023-12-08 ENCOUNTER — Other Ambulatory Visit (HOSPITAL_COMMUNITY): Payer: Self-pay

## 2023-12-10 ENCOUNTER — Other Ambulatory Visit (HOSPITAL_COMMUNITY): Payer: Self-pay

## 2023-12-10 ENCOUNTER — Other Ambulatory Visit: Payer: Self-pay | Admitting: Nurse Practitioner

## 2023-12-10 DIAGNOSIS — C50411 Malignant neoplasm of upper-outer quadrant of right female breast: Secondary | ICD-10-CM

## 2023-12-10 DIAGNOSIS — Z515 Encounter for palliative care: Secondary | ICD-10-CM

## 2023-12-10 DIAGNOSIS — M792 Neuralgia and neuritis, unspecified: Secondary | ICD-10-CM

## 2023-12-10 MED ORDER — PREGABALIN 100 MG PO CAPS
100.0000 mg | ORAL_CAPSULE | Freq: Two times a day (BID) | ORAL | 1 refills | Status: DC
  Filled 2023-12-10: qty 60, 30d supply, fill #0
  Filled 2024-01-14: qty 60, 30d supply, fill #1

## 2023-12-11 ENCOUNTER — Other Ambulatory Visit: Payer: Self-pay | Admitting: Hematology and Oncology

## 2023-12-11 ENCOUNTER — Other Ambulatory Visit: Payer: Self-pay

## 2023-12-11 ENCOUNTER — Other Ambulatory Visit: Payer: Self-pay | Admitting: Pharmacy Technician

## 2023-12-11 MED ORDER — ABEMACICLIB 50 MG PO TABS
50.0000 mg | ORAL_TABLET | Freq: Two times a day (BID) | ORAL | 5 refills | Status: DC
  Filled 2023-12-11: qty 56, 28d supply, fill #0
  Filled 2024-01-04: qty 56, 28d supply, fill #1
  Filled 2024-02-04: qty 56, 28d supply, fill #2
  Filled 2024-03-07: qty 56, 28d supply, fill #3
  Filled 2024-04-07: qty 56, 28d supply, fill #4
  Filled 2024-05-01 (×2): qty 56, 28d supply, fill #5

## 2023-12-11 NOTE — Progress Notes (Signed)
 Specialty Pharmacy Refill Coordination Note  Melody Rodriguez is a 48 y.o. female contacted today regarding refills of specialty medication(s) Abemaciclib Kathlen Mody)   Patient requested (Patient-Rptd) Pickup at [facility] at [facility] date: (Patient-Rptd) 12/14/23   Medication will be filled on 12/13/23.   RR sent to MD

## 2023-12-12 ENCOUNTER — Other Ambulatory Visit: Payer: Self-pay | Admitting: Radiology

## 2023-12-12 ENCOUNTER — Other Ambulatory Visit: Payer: Self-pay

## 2023-12-13 ENCOUNTER — Other Ambulatory Visit: Payer: Self-pay

## 2023-12-13 NOTE — H&P (Shared)
 Chief Complaint: Metastatic right breast cancer - ; referred for port-a-catheter placement to assist with treatment  Referring Provider(s): Dr. Serena Croissant, oncology  Supervising Physician: Malachy Moan  Patient Status: [facility] - Out-pt  History of Present Illness: Melody Rodriguez is a 48 y.o. female with hx of malignant neoplasm of upper-outer quadrant of right breast, estrogen receptor positive. Initial diagnosis in 2021. Currently being followed by Dr. Pamelia Hoit with oncology, pt being treated with Verzenio with Letrozole (started 10/03/2023) . Pt referred to IR for port-a-catheter placement as patient has had difficult peripheral access. Previously had left chest port,removed in 2022.   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   Stomach cancer   Depression    Diabetes mellitus without complication (HCC)    glipizide and actps; cbgs 200s fasting   Family history of colon cancer    GERD (gastroesophageal reflux disease)    Headache    History of goiter    History of radiation therapy    right breast/scv  05/10/21-07/01/21  Dr Antony Blackbird   Hyperlipidemia    Hypertension    Monoallelic mutation of CTNNA3 gene    Recurrent major depressive disorder [facility])     Past Surgical History:  Procedure Laterality Date   BREAST BIOPSY Right 02/18/2021   Procedure: EVACUATION HEMATOMA RIGHT AXILLA;  Surgeon: Abigail Miyamoto, MD;  Location: [facility];  Service: General;  Laterality: Right;   BREAST CYST EXCISION Right    Patient does not recall (2014 or 2015)   BREAST LUMPECTOMY WITH RADIOACTIVE SEED AND SENTINEL LYMPH NODE BIOPSY Right 02/07/2021   Procedure: RIGHT BREAST LUMPECTOMY WITH RADIOACTIVE SEED AND SEED TARGETED LYMPH NODE BIOPSY AND SENTINEL LYMPH NODE BIOPSY;  Surgeon: Abigail Miyamoto,  MD;  Location: [facility];  Service: General;  Laterality: Right;   CESAREAN SECTION     x2   COLONOSCOPY     IR IMAGING GUIDED PORT INSERTION  09/15/2020   IR REMOVAL TUN CV CATH W/O FL  09/15/2020   NODE DISSECTION Right 03/28/2021   Procedure: RIGHT AXILLARY LYMPH NODE DISSECTION;  Surgeon: Abigail Miyamoto, MD;  Location: [facility];  Service: General;  Laterality: Right;   PORT-A-CATH REMOVAL Left 02/07/2021   Procedure: REMOVAL PORT-A-CATH;  Surgeon: Abigail Miyamoto, MD;  Location: [facility];  Service: General;  Laterality: Left;   ROBOTIC ASSISTED BILATERAL SALPINGO OOPHERECTOMY Bilateral 02/22/2023   Procedure: XI ROBOTIC ASSISTED LAPAROSCOPIC BILATERAL SALPINGO OOPHORECTOMY;  Surgeon: Carver Fila, MD;  Location: [facility];  Service: Gynecology;  Laterality: Bilateral;   THYROIDECTOMY, PARTIAL     UPPER GASTROINTESTINAL ENDOSCOPY      Allergies: Duloxetine, Keflex [cephalexin], Tramadol, Latex, and Naproxen  Medications: Prior to Admission medications   Medication Sig Start Date End Date Taking? Authorizing Provider  abemaciclib (VERZENIO) 50 MG tablet Take 1 tablet (50 mg total) by mouth 2 (two) times daily. Swallow tablets whole. Do not chew, crush, or split tablets before swallowing. 12/11/23   Serena Croissant, MD  ACCU-CHEK GUIDE test strip USE TO MONITOR BLOOD GLUCOSE 3 TIME(S) DAILY 10/11/21   [provider]  baclofen (LIORESAL) 10 MG tablet Take 1 tablet (10 mg total) by mouth 3 (three) times daily. 10/09/23   Pickenpack-Cousar,  Arty Baumgartner, NP  CREON 24000-76000 units CPEP Take by mouth. Take 2 capsules by mouth 3 times daily with meals. Take 2 capsules with meals and 1 with snacks      glipiZIDE (GLUCOTROL XL) 2.5 MG 24 hr tablet Take 1 tablet (2.5 mg total) by mouth daily. 10/31/23     glipiZIDE (GLUCOTROL XL) 2.5 MG 24 hr tablet Take 1 tablet (2.5 mg total) by mouth daily. 12/03/23     Glucose Blood (BLOOD GLUCOSE TEST  STRIPS) STRP Use to check blood sugar as directed 07/11/23     hydrocortisone (ANUSOL-HC) 2.5 % rectal cream Apply rectally 2 times daily 11/28/23     HYDROmorphone (DILAUDID) 2 MG tablet Take 1 tablet (2 mg total) by mouth every 6 (six) hours as needed for severe pain (pain score 7-10). 11/22/23   Pickenpack-Cousar, Arty Baumgartner, NP  letrozole (FEMARA) 2.5 MG tablet Take 1 tablet (2.5 mg total) by mouth daily. 09/25/23   Serena Croissant, MD  ondansetron (ZOFRAN-ODT) 8 MG disintegrating tablet Take 1 tablet (8 mg total) by mouth every 8 (eight) hours as needed for nausea or vomiting. 10/09/23   Pickenpack-Cousar, Arty Baumgartner, NP  pantoprazole (PROTONIX) 40 MG tablet Take 1 tablet (40 mg total) by mouth daily. 07/11/23     potassium chloride SA (KLOR-CON M) 20 MEQ tablet Take 1 tablet (20 mEq total) by mouth 2 (two) times daily for 7 days. 12/06/23 12/13/23  Serena Croissant, MD  pregabalin (LYRICA) 100 MG capsule Take 1 capsule (100 mg total) by mouth 2 (two) times daily. 12/10/23   Pickenpack-Cousar, Arty Baumgartner, NP  sucralfate (CARAFATE) 1 GM/10ML suspension Take 10 mLs (1 g total) by mouth 4 (four) times daily before meals and nightly 06/06/23     triamcinolone cream (KENALOG) 0.1 % Apply 1 Application topically 2 (two) times daily as needed. 10/09/23     valACYclovir (VALTREX) 1000 MG tablet Take 1 tablet (1,000 mg total) by mouth 2 (two) times daily. 10/24/23   Serena Croissant, MD  atorvastatin (LIPITOR) 10 MG tablet Take 1 tablet (10 mg total) by mouth daily. 09/13/22 05/16/23       Family History  Problem Relation Age of Onset   Hypertension Mother    Colon cancer Sister 33   Cancer Maternal Aunt        unknown type dx late 68s   Diabetes Maternal Grandmother    Breast cancer Neg Hx    Ovarian cancer Neg Hx    Endometrial cancer Neg Hx    Pancreatic cancer Neg Hx    Prostate cancer Neg Hx    Esophageal cancer Neg Hx    Stomach cancer Neg Hx     Social History   Socioeconomic History   Marital status: Single     Spouse name: Not on file   Number of children: 2   Years of education: Not on file   Highest education level: Some college, no degree  Occupational History   Occupation: unemployed  Tobacco Use   Smoking status: Former    Current packs/day: 1.00    Average packs/day: 1 pack/day for 22.0 years (22.0 ttl pk-yrs)    Types: Cigars, Cigarettes   Smokeless tobacco: Never   Tobacco comments:    quit 07/2020  Vaping Use   Vaping status: Never Used  Substance and Sexual Activity   Alcohol use: Not Currently    Comment: socially   Drug use: Not Currently    Types: Marijuana    Comment: daily  Sexual activity: Not Currently    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  Other Topics Concern   Not on file  Social History Narrative   Not on file   Social Drivers of Health   Financial Resource Strain: Low Risk  (11/26/2023)   Received from [facility]   Overall Financial Resource Strain (CARDIA)    Difficulty of Paying Living Expenses: Not very hard  Food Insecurity: Food Insecurity Present (11/26/2023)   Received from [facility]   Hunger Vital Sign    Worried About Running Out of Food in the Last Year: Sometimes true    Ran Out of Food in the Last Year: Sometimes true  Transportation Needs: No Transportation Needs (11/26/2023)   Received from [facility] - Transportation    Lack of Transportation (Medical): No    Lack of Transportation (Non-Medical): No  Physical Activity: Insufficiently Active (11/26/2023)   Received from [facility]   Exercise Vital Sign    Days of Exercise per Week: 5 days    Minutes of Exercise per Session: 20 min  Stress: Stress Concern Present (11/26/2023)   Received from [facility] - Occupational Stress Questionnaire    Feeling of Stress : Very much  Social Connections: Socially Isolated (11/26/2023)   Received from [facility]   Social Network    How would you rate your social  network (family, work, friends)?: Little participation, lonely and socially isolated       Review of Systems denies fever,HA, dyspnea, bleeding; has had some left lateral chest discomfort, occ cough, abd/back pain,occ N/V  Vital Signs: Vitals:   12/14/23 1218  BP: (!) 144/84  Pulse: 62  Resp: 18  Temp: 98.4 F (36.9 C)  SpO2: 100%     Advance Care Plan: No documents on file.  Physical Exam; awake/alert; chest- CTA bilat; heart- RRR; abd-soft,+BS, mild gen tenderness to palpation; no LE edema  Imaging: MM DIAG BREAST TOMO BILATERAL Result Date: 11/15/2023 CLINICAL DATA:  48 year old female presents for annual follow-up. History of RIGHT breast cancer and lumpectomy in 2022. EXAM: DIGITAL DIAGNOSTIC BILATERAL MAMMOGRAM WITH TOMOSYNTHESIS AND CAD TECHNIQUE: Bilateral digital diagnostic mammography and breast tomosynthesis was performed. The images were evaluated with computer-aided detection. COMPARISON:  Previous exam(s). ACR Breast Density Category b: There are scattered areas of fibroglandular density. FINDINGS: Full field views of both breasts and a magnification view of the lumpectomy site demonstrate no suspicious mass, nonsurgical distortion or worrisome calcifications. RIGHT lumpectomy changes are again noted. IMPRESSION: No evidence of breast malignancy. RECOMMENDATION: Per protocol, as the patient is now 2 or more years status post lumpectomy, she may return to annual screening mammography in 1 year. However, given the history of breast cancer, the patient remains eligible for annual diagnostic mammography if preferred. (Code:SM-B-01Y) I have discussed the findings and recommendations with the patient. If applicable, a reminder letter will be sent to the patient regarding the next appointment. BI-RADS CATEGORY  2: Benign. Electronically Signed   By: Harmon Pier M.D.   On: 11/15/2023 10:54    Labs:  CBC: Recent Labs    10/24/23 0941 11/14/23 0915 11/21/23 0827 12/03/23 0756   WBC 4.5 2.2* 3.7* 4.1  HGB 12.1 11.9* 12.0 13.5  HCT 37.0 36.3 35.5* 41.4  PLT 153 123* 202 196    COAGS: No results for input(s): "INR", "APTT" in the last 8760 hours.  BMP: Recent Labs    10/24/23 0941 11/14/23 0915 11/21/23  0827 12/03/23 0756  NA 144 144 143 144  K 3.3* 2.9* 3.8 3.8  CL 108 108 110 110  CO2 31 29 28 26   GLUCOSE 85 108* 92 88  BUN 12 14 8 9   CALCIUM 9.2 9.0 8.8* 9.3  CREATININE 0.90 0.90 0.62 0.80  GFRNONAA >60 >60 >60 >60    LIVER FUNCTION TESTS: Recent Labs    10/24/23 0941 11/14/23 0915 11/21/23 0827 12/03/23 0756  BILITOT 0.5 0.5 0.4 0.4  AST 11* 16 12* 14*  ALT 11 13 10 13   ALKPHOS 74 69 66 88  PROT 7.0 7.4 6.9 8.1  ALBUMIN 4.4 4.4 4.1 4.9    TUMOR MARKERS: No results for input(s): "AFPTM", "CEA", "CA199", "CHROMGRNA" in the last 8760 hours.  Assessment and Plan:  Pt with hx of metastatic right breast, estrogen receptor positive. Followed and being treated by Dr. Pamelia Hoit with oncology. Initial diagnosis 2021. Pt known to IR team from PICC and port a cath placement in 2021. Port removed in 2022. Referred to IR for new port-a-cath placement due to poor peripheral access.  Risks and benefits of image-guided Port-a-catheter placement were discussed with the patient including, but not limited to bleeding, infection, pneumothorax, or fibrin sheath development and need for additional procedures. All of the patient's questions were answered, patient is agreeable to proceed.  Consent signed and in chart.  Thank you for allowing our service to participate in Melody Rodriguez 's care.  Electronically Signed: Katheren Puller, PA-C  Caryn Bee Yuliya Nova,PA-C 12/13/2023, 3:39 PM      I spent a total of  15 minutes   in face to face in clinical consultation, greater than 50% of which was counseling/coordinating care for port-a-cath placement

## 2023-12-14 ENCOUNTER — Ambulatory Visit (HOSPITAL_COMMUNITY)
Admission: RE | Admit: 2023-12-14 | Discharge: 2023-12-14 | Disposition: A | Discharge status: Home / Self Care | Source: Ambulatory Visit | Attending: Hematology and Oncology | Admitting: Hematology and Oncology

## 2023-12-14 ENCOUNTER — Inpatient Hospital Stay: Admitting: Licensed Clinical Social Worker

## 2023-12-14 ENCOUNTER — Encounter: Payer: Self-pay | Admitting: Nurse Practitioner

## 2023-12-14 ENCOUNTER — Other Ambulatory Visit: Payer: Self-pay

## 2023-12-14 ENCOUNTER — Encounter (HOSPITAL_COMMUNITY): Payer: Self-pay

## 2023-12-14 ENCOUNTER — Inpatient Hospital Stay (HOSPITAL_BASED_OUTPATIENT_CLINIC_OR_DEPARTMENT_OTHER): Admitting: Nurse Practitioner

## 2023-12-14 ENCOUNTER — Other Ambulatory Visit (HOSPITAL_COMMUNITY): Payer: Self-pay

## 2023-12-14 VITALS — BP 138/71 | Temp 98.0°F | Resp 17 | Ht 64.0 in | Wt 164.2 lb

## 2023-12-14 DIAGNOSIS — Z79811 Long term (current) use of aromatase inhibitors: Secondary | ICD-10-CM | POA: Diagnosis not present

## 2023-12-14 DIAGNOSIS — Z170 Estrogen receptor positive status [ER+]: Secondary | ICD-10-CM

## 2023-12-14 DIAGNOSIS — F17290 Nicotine dependence, other tobacco product, uncomplicated: Secondary | ICD-10-CM | POA: Insufficient documentation

## 2023-12-14 DIAGNOSIS — C50411 Malignant neoplasm of upper-outer quadrant of right female breast: Secondary | ICD-10-CM | POA: Diagnosis not present

## 2023-12-14 DIAGNOSIS — Z515 Encounter for palliative care: Secondary | ICD-10-CM

## 2023-12-14 DIAGNOSIS — R530 Neoplastic (malignant) related fatigue: Secondary | ICD-10-CM | POA: Diagnosis not present

## 2023-12-14 DIAGNOSIS — G893 Neoplasm related pain (acute) (chronic): Secondary | ICD-10-CM | POA: Diagnosis not present

## 2023-12-14 DIAGNOSIS — Z800 Family history of malignant neoplasm of digestive organs: Secondary | ICD-10-CM | POA: Diagnosis not present

## 2023-12-14 HISTORY — PX: IR IMAGING GUIDED PORT INSERTION: IMG5740

## 2023-12-14 MED ORDER — BACLOFEN 5 MG PO TABS
15.0000 mg | ORAL_TABLET | Freq: Three times a day (TID) | ORAL | 2 refills | Status: DC
  Filled 2023-12-14: qty 180, 20d supply, fill #0
  Filled 2024-02-01: qty 180, 20d supply, fill #1
  Filled 2024-02-22: qty 180, 20d supply, fill #2

## 2023-12-14 MED ORDER — FENTANYL CITRATE (PF) 100 MCG/2ML IJ SOLN
INTRAMUSCULAR | Status: AC
  Filled 2023-12-14: qty 2

## 2023-12-14 MED ORDER — LIDOCAINE-EPINEPHRINE 1 %-1:100000 IJ SOLN
INTRAMUSCULAR | Status: AC
  Filled 2023-12-14: qty 1

## 2023-12-14 MED ORDER — HEPARIN SOD (PORK) LOCK FLUSH 100 UNIT/ML IV SOLN
INTRAVENOUS | Status: AC
Start: 2023-12-14 — End: ?
  Filled 2023-12-14: qty 5

## 2023-12-14 MED ORDER — LIDOCAINE-EPINEPHRINE 1 %-1:100000 IJ SOLN
20.0000 mL | Freq: Once | INTRAMUSCULAR | Status: AC
  Administered 2023-12-14: 20 mL via INTRADERMAL

## 2023-12-14 MED ORDER — SODIUM CHLORIDE 0.9 % IV SOLN: INTRAVENOUS | Status: DC

## 2023-12-14 MED ORDER — MIDAZOLAM HCL 2 MG/2ML IJ SOLN
INTRAMUSCULAR | Status: AC | PRN
Start: 2023-12-14 — End: 2023-12-14
  Administered 2023-12-14 (×4): 1 mg via INTRAVENOUS

## 2023-12-14 MED ORDER — FENTANYL CITRATE (PF) 100 MCG/2ML IJ SOLN
INTRAMUSCULAR | Status: AC | PRN
  Administered 2023-12-14 (×2): 50 ug via INTRAVENOUS

## 2023-12-14 MED ORDER — HYDROMORPHONE HCL 2 MG PO TABS
2.0000 mg | ORAL_TABLET | Freq: Four times a day (QID) | ORAL | 0 refills | Status: DC | PRN
Start: 2023-12-14 — End: 2024-01-03
  Filled 2023-12-14: qty 60, 15d supply, fill #0

## 2023-12-14 MED ORDER — HEPARIN SOD (PORK) LOCK FLUSH 100 UNIT/ML IV SOLN
500.0000 [IU] | Freq: Once | INTRAVENOUS | Status: AC
  Administered 2023-12-14: 500 [IU] via INTRAVENOUS

## 2023-12-14 MED ORDER — MIDAZOLAM HCL 2 MG/2ML IJ SOLN
INTRAMUSCULAR | Status: AC
  Filled 2023-12-14: qty 2

## 2023-12-14 MED ORDER — MIDAZOLAM HCL 2 MG/2ML IJ SOLN
INTRAMUSCULAR | Status: AC
Start: 2023-12-14 — End: ?
  Filled 2023-12-14: qty 2

## 2023-12-14 NOTE — Progress Notes (Signed)
 [facility] CSW Counseling Note  Patient was referred by self. Treatment type: Individual  Presenting Concerns: Patient and/or family reports the following symptoms/concerns:  adjustment to stage IV cancer diagnosis Duration of problem: 3 months; Severity of problem: moderate   Orientation:oriented to person, place, time/date, and situation.   Affect: Appropriate and Congruent Risk of harm to self or others: No plan to harm self or others  Patient and/or Family's Strengths/Protective Factors: Ability for insight  Capable of independent living  Communication skills      Goals Addressed: Patient will:  Increase knowledge and/or ability of: coping skills  Increase healthy adjustment to current life circumstances and process new diagnosis   Progress towards Goals: Progressing   Interventions: Interventions utilized:  CBT and Strength-based      Assessment: Short visit today due to other appointments.  Pt is dealing with ongoing stressors with her insurance not being processed correctly through billing and trying to get a case manager through her Medicaid.  She is also having more pain (saw palliative today for assistance).  CSW briefly discussed pain gate theory and things that can open or close the gate.  Discussed how emotional and mental stress, such as that of dealing with her relatives, can impact pain as well.  Will continue to explore family trauma and coping with chronic pain in future sessions.      Plan: Follow up with CSW: 01/03/2024 9am Behavioral recommendations: Follow-up with insurance case Production designer, theatre/television/film. Notice what activities are helpful in relaxing and de-stressing.  Referral(s): Advance Directives clinic; [name]; [place]       Mekhi Lascola [place], LCSW

## 2023-12-14 NOTE — Progress Notes (Signed)
 Palliative Medicine [facility]  [phone] [phone]   Name: Melody Rodriguez Date: 12/14/2023 MRN: [id number]  DOB: Aug 23, 1976  Patient Care Team: Modesta Messing as PCP - General (Physician Assistant) Abigail Miyamoto, MD as Consulting Physician (General Surgery) Serena Croissant, MD as Consulting Physician (Hematology and Oncology) Antony Blackbird, MD as Consulting Physician (Radiation Oncology) Pickenpack-Cousar, Arty Baumgartner, NP as Nurse Practitioner (Hospice and Palliative Medicine)    INTERVAL HISTORY: Melody Rodriguez is a 48 y.o. 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  Palliative ask to see for symptom management and goals of care.  SOCIAL HISTORY:     reports that she has quit smoking. Her smoking use included cigars and cigarettes. She has a 22 pack-year smoking history. She has never used smokeless tobacco. She reports that she does not currently use alcohol. She reports that she does not currently use drugs after having used the following drugs: Marijuana.  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  MEDICATIONS:  Current Outpatient Medications  Medication Sig Dispense Refill   abemaciclib (VERZENIO) 50 MG tablet Take 1 tablet (50 mg total) by mouth 2 (two) times daily. Swallow tablets whole. Do not chew, crush, or split tablets before swallowing. 56 tablet 5   ACCU-CHEK GUIDE test strip USE TO MONITOR BLOOD GLUCOSE 3 TIME(S) DAILY     baclofen 15 MG TABS Take 15 mg by mouth 3 (three) times daily. 60 each 2   CREON 24000-76000 units CPEP Take by mouth. Take 2 capsules by mouth 3 times daily with meals. Take 2 capsules with meals and 1 with snacks     glipiZIDE (GLUCOTROL XL) 2.5 MG 24 hr tablet Take 1 tablet (2.5 mg total) by mouth daily. 30 tablet 1   glipiZIDE (GLUCOTROL XL) 2.5 MG 24 hr tablet Take 1 tablet (2.5 mg total) by mouth daily. 30 tablet 2   Glucose Blood (BLOOD GLUCOSE TEST STRIPS) STRP Use to check blood sugar as directed 100 each 5   hydrocortisone (ANUSOL-HC) 2.5 % rectal cream Apply rectally 2 times daily 30 g 1   HYDROmorphone (DILAUDID) 2 MG tablet Take 1 tablet (2 mg total) by mouth every 6 (six) hours as needed for severe pain (pain score 7-10). 60 tablet 0   letrozole (FEMARA) 2.5 MG tablet Take 1 tablet (2.5 mg total) by mouth daily. 90 tablet 3   ondansetron (ZOFRAN-ODT) 8 MG disintegrating tablet Take 1 tablet (8 mg total) by mouth every 8 (eight) hours as needed for  nausea or vomiting. 60 tablet 4   pantoprazole (PROTONIX) 40 MG tablet Take 1 tablet (40 mg total) by mouth daily. 30 tablet 11   potassium chloride SA (KLOR-CON M) 20 MEQ tablet Take 1 tablet (20 mEq total) by mouth 2 (two) times daily for 7 days. 14 tablet 0   pregabalin (LYRICA) 100 MG capsule Take 1 capsule (100 mg total) by mouth 2 (two) times daily. 60 capsule 1   sucralfate (CARAFATE) 1 GM/10ML suspension Take 10 mLs (1 g total) by mouth 4 (four) times daily before meals and nightly 1200 mL 11   triamcinolone cream (KENALOG) 0.1 % Apply 1 Application topically 2 (two) times  daily as needed. 40 g 1   valACYclovir (VALTREX) 1000 MG tablet Take 1 tablet (1,000 mg total) by mouth 2 (two) times daily. 14 tablet 0   Current Facility-Administered Medications  Medication Dose Route Frequency Provider Last Rate Last Admin   0.9 %  sodium chloride infusion  500 mL Intravenous Continuous Imogene Burn, MD        VITAL SIGNS: BP 138/71 (BP Location: Left Arm, Patient Position: Sitting)   Temp 98 F (36.7 C) (Temporal)   Resp 17   Ht 5\' 4"  (1.626 m)   Wt 164 lb 3.2 oz (74.5 kg)   BMI 28.18 kg/m  Filed Weights   12/14/23 1044  Weight: 164 lb 3.2 oz (74.5 kg)    Estimated body mass index is 28.18 kg/m as calculated from the following:   Height as of this encounter: 5\' 4"  (1.626 m).   Weight as of this encounter: 164 lb 3.2 oz (74.5 kg).   PERFORMANCE STATUS (ECOG) : 1 - Symptomatic but completely ambulatory   Physical Exam General: NAD Cardiovascular: regular rate and rhythm Pulmonary: normal breathing pattern Abdomen: soft, nontender, + bowel sounds Extremities: no edema, no joint deformities Skin: no rashes Neurological:   Discussed the use of AI scribe software for clinical note transcription with the patient, who gave verbal consent to proceed. History of Present Illness Melody Rodriguez is a 48 year old female who presents  to clinic for follow-up. No acute distress. She is scheduled to have Port placed today. Denies concerns with nausea, vomiting, constipation, or diarrhea. Ambulates with a rollator.  Tries to remain as active as possible.   She experiences difficulty sleeping, particularly the night before her procedure, despite not feeling nervous about it. There is a pattern of being unable to sleep at times without a clear reason, which she finds distressing.  We discussed ways to relax her mind and focus on resting.  Dawnisha reports her pain is well-controlled with current regimen.  Currently taking hydromorphone 2 mg every 6 hours as needed,  Lyrica 100 mg twice daily, baclofen 10 mg 3 times daily as needed.  Refills are appropriate.  No changes to current regimen at this time.  We will continue to closely monitor.  All questions answered and support provided.     I discussed the importance of continued conversation with family and their medical providers regarding overall plan of care and treatment options, ensuring decisions are within the context of the patients values and GOCs. Assessment & Plan Cancer Related Pain Pain well-controlled on current regimen. -Continue hydromorphone 2 mg every 6 hours as needed. -Continue Lyrica 100 mg twice daily. Continue baclofen 15 mg 3 times daily.  Insomnia Difficulty sleeping, especially pre-procedures. Denies anxiety as primary cause. Considering muscle relaxer adjustment. -Education provided on ways to focus on  relaxation. - Consider alternative or additional sleep aids if insomnia persists.  Financial and Social Stress -Patient is actively engaged with Marcelino Duster, [place]  I will plan to see her back in 4-6 weeks.  Sooner if needed.   Patient expressed understanding and was in agreement with this plan. She also understands that She can call the clinic at any time with any questions, concerns, or complaints.   Any controlled substances utilized were prescribed in the context of palliative care. PDMP has been reviewed.   Visit consisted of counseling and education dealing with the complex and emotionally intense issues of symptom management and palliative care in the setting of serious and potentially life-threatening illness.  Willette Alma, AGPCNP-BC  Palliative Medicine Team/[facility]

## 2023-12-14 NOTE — Discharge Instructions (Signed)

## 2023-12-14 NOTE — Procedures (Signed)
 Interventional Radiology Procedure Note  Procedure: Placement of a LEFT IJ approach single lumen Bard ClearVue port.  Tip is positioned at the superior cavoatrial junction and catheter is ready for immediate use.   Complications: No immediate  Recommendations:  - Ok to shower tomorrow - Do not submerge for 7 days - Routine line care   Signed,  Sterling Big, MD

## 2023-12-24 ENCOUNTER — Other Ambulatory Visit (HOSPITAL_COMMUNITY): Payer: Self-pay

## 2024-01-01 ENCOUNTER — Inpatient Hospital Stay: Attending: Hematology and Oncology

## 2024-01-01 ENCOUNTER — Inpatient Hospital Stay (HOSPITAL_BASED_OUTPATIENT_CLINIC_OR_DEPARTMENT_OTHER): Admitting: Hematology and Oncology

## 2024-01-01 VITALS — BP 125/69 | HR 67 | Temp 98.4°F | Resp 16 | Ht 64.0 in | Wt 166.2 lb

## 2024-01-01 DIAGNOSIS — C50411 Malignant neoplasm of upper-outer quadrant of right female breast: Secondary | ICD-10-CM

## 2024-01-01 DIAGNOSIS — Z170 Estrogen receptor positive status [ER+]: Secondary | ICD-10-CM | POA: Diagnosis not present

## 2024-01-01 DIAGNOSIS — Z923 Personal history of irradiation: Secondary | ICD-10-CM | POA: Insufficient documentation

## 2024-01-01 DIAGNOSIS — C773 Secondary and unspecified malignant neoplasm of axilla and upper limb lymph nodes: Secondary | ICD-10-CM | POA: Insufficient documentation

## 2024-01-01 DIAGNOSIS — Z1721 Progesterone receptor positive status: Secondary | ICD-10-CM | POA: Insufficient documentation

## 2024-01-01 DIAGNOSIS — Z85028 Personal history of other malignant neoplasm of stomach: Secondary | ICD-10-CM | POA: Insufficient documentation

## 2024-01-01 DIAGNOSIS — Z79811 Long term (current) use of aromatase inhibitors: Secondary | ICD-10-CM | POA: Diagnosis not present

## 2024-01-01 LAB — CBC WITH DIFFERENTIAL (CANCER CENTER ONLY)
Abs Immature Granulocytes: 0.01 10*3/uL (ref 0.00–0.07)
Basophils Absolute: 0.1 10*3/uL (ref 0.0–0.1)
Basophils Relative: 2 %
Eosinophils Absolute: 0.1 10*3/uL (ref 0.0–0.5)
Eosinophils Relative: 2 %
HCT: 35.2 % — ABNORMAL LOW (ref 36.0–46.0)
Hemoglobin: 11.8 g/dL — ABNORMAL LOW (ref 12.0–15.0)
Immature Granulocytes: 0 %
Lymphocytes Relative: 33 %
Lymphs Abs: 1.3 10*3/uL (ref 0.7–4.0)
MCH: 31.4 pg (ref 26.0–34.0)
MCHC: 33.5 g/dL (ref 30.0–36.0)
MCV: 93.6 fL (ref 80.0–100.0)
Monocytes Absolute: 0.2 10*3/uL (ref 0.1–1.0)
Monocytes Relative: 5 %
Neutro Abs: 2.4 10*3/uL (ref 1.7–7.7)
Neutrophils Relative %: 58 %
Platelet Count: 167 10*3/uL (ref 150–400)
RBC: 3.76 MIL/uL — ABNORMAL LOW (ref 3.87–5.11)
RDW: 13.8 % (ref 11.5–15.5)
WBC Count: 4.1 10*3/uL (ref 4.0–10.5)
nRBC: 0 % (ref 0.0–0.2)

## 2024-01-01 LAB — CMP (CANCER CENTER ONLY)
ALT: 13 U/L (ref 0–44)
AST: 11 U/L — ABNORMAL LOW (ref 15–41)
Albumin: 4.3 g/dL (ref 3.5–5.0)
Alkaline Phosphatase: 69 U/L (ref 38–126)
Anion gap: 4 — ABNORMAL LOW (ref 5–15)
BUN: 18 mg/dL (ref 6–20)
CO2: 28 mmol/L (ref 22–32)
Calcium: 8.9 mg/dL (ref 8.9–10.3)
Chloride: 111 mmol/L (ref 98–111)
Creatinine: 0.76 mg/dL (ref 0.44–1.00)
GFR, Estimated: >60 mL/min (ref >60)
Glucose, Bld: 118 mg/dL — ABNORMAL HIGH (ref 70–99)
Potassium: 3.6 mmol/L (ref 3.5–5.1)
Sodium: 143 mmol/L (ref 135–145)
Total Bilirubin: 0.4 mg/dL (ref 0.0–1.2)
Total Protein: 7 g/dL (ref 6.5–8.1)

## 2024-01-01 MED ORDER — HEPARIN SOD (PORK) LOCK FLUSH 100 UNIT/ML IV SOLN
250.0000 [IU] | Freq: Once | INTRAVENOUS | Status: AC
  Administered 2024-01-01: 250 [IU]

## 2024-01-01 MED ORDER — SODIUM CHLORIDE 0.9% FLUSH
10.0000 mL | Freq: Once | INTRAVENOUS | Status: AC
  Administered 2024-01-01: 10 mL

## 2024-01-01 NOTE — Progress Notes (Signed)
 Patient Care Team: Modesta Messing as PCP - General (Physician Assistant) Abigail Miyamoto, MD as Consulting Physician (General Surgery) Serena Croissant, MD as Consulting Physician (Hematology and Oncology) Antony Blackbird, MD as Consulting Physician (Radiation Oncology) Pickenpack-Cousar, Arty Baumgartner, NP as Nurse Practitioner [place] and Palliative Medicine)  DIAGNOSIS:  Encounter Diagnosis  Name Primary?   Malignant neoplasm of upper-outer quadrant of right breast in female, estrogen receptor positive (HCC) Yes    SUMMARY OF ONCOLOGIC HISTORY: Oncology History  Malignant neoplasm of upper-outer quadrant of right breast in female, estrogen receptor positive (HCC)  07/28/2020 Initial Diagnosis   Patient palpated a right breast mass for 1-2 years. Mammogram showed a 2.2cm mass at the 11 o'clock position with surrounding calcifications, 6.4cm in total extent, and up to 5 abnormal right axillary lymph nodes. 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    08/05/2020 Miscellaneous   MammaPrint: High risk luminal type B   08/11/2020 Genetic Testing   Negative genetic testing: no pathogenic variants detected in [facility] Breast Cancer STAT Panel or Common Hereditary Cancers panel. The report dates are August 11, 2020 and August 19, 2020, respectively. Two variants of uncertain signficance were detected - one in the CTNNA1 gene called c.86del and the second in the MLH1 gene called c.808A>G.   UPDATE:  The MLH1 c.808A>G VUS was reclassified to "Likely Benign" on 02/19/2021. The change in variant classification was made as a result of re-review of the evidence in light of new variant interpretation guidelines and/or new information.   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 The amended report date is February 16, 2022.   The STAT  Breast cancer panel offered by [facility] includes sequencing and rearrangement analysis for the following 9 genes:  ATM, BRCA1, BRCA2, CDH1, CHEK2, PALB2, PTEN, STK11 and TP53.  The Common Hereditary Cancers Panel offered by [facility] includes sequencing and/or deletion duplication testing of the following 48 genes: APC, ATM, AXIN2, BARD1, BMPR1A, BRCA1, BRCA2, BRIP1, CDH1, CDK4, CDKN2A (p14ARF), CDKN2A (p16INK4a), CHEK2, CTNNA1, DICER1, EPCAM (Deletion/duplication testing only), GREM1 (promoter region deletion/duplication testing only), KIT, MEN1, MLH1, MSH2, MSH3, MSH6, MUTYH, NBN, NF1, NTHL1, PALB2, PDGFRA, PMS2, POLD1, POLE, PTEN, RAD50, RAD51C, RAD51D, RNF43, SDHB, SDHC, SDHD, SMAD4, SMARCA4. STK11, TP53, TSC1, TSC2, and VHL.  The following genes were evaluated for sequence changes only: SDHA and HOXB13 c.251G>A variant only.    09/01/2020 - 01/11/2021 Neo-Adjuvant Chemotherapy   Adriamycin and Cytoxan x4 09/01/2020-10/12/2020 Weekly Taxol x 12  10/26/2020-01/11/2021(dose reduced d/t AE)   02/07/2021 Surgery   Right lumpectomy Magnus Ivan): invasive and in situ ductal carcinoma, 2.8cm, clear margins, with metastatic carcinoma in 1/1 right axillary lymph nodes.   03/28/2021 Surgery   Axillary lymph node dissection: 3/7 lymph nodes +   05/10/2021 - 07/01/2021 Radiation Therapy   Site Technique Total Dose (Gy) Dose per Fx (Gy) Completed Fx Beam Energies  Breast, Right: Breast_Rt 3D 50.4/50.4 1.8 28/28 10X  Breast, Right: Breast_Rt_Bst specialPort 12/12 2 6/6 15E  Sclav-RT: SCV_Rt 3D 50.4/50.4 1.8 28/28      07/2021 -  Anti-estrogen oral therapy   Zoladex + Letrozole + Verzenio     CHIEF COMPLIANT: Follow-up on Verzenio  HISTORY OF PRESENT ILLNESS: History of Present Illness The patient, on Verzenio for an unspecified condition, presents with intermittent diarrhea and swelling. She reports mild diarrhea, occurring  once a week, which she manages with Imodium. She expresses concern about taking Imodium  daily due to fear of constipation.  She also reports swelling in her legs and hands, which she manages by elevating her feet. She describes her abdomen as feeling bloated frequently, but is unsure if this is a symptom or a psychological perception.  The patient has made dietary adjustments to manage her symptoms, focusing on protein intake and avoiding heavy meats like beef. She finds chicken and lamb easier to digest. She also consumes certain smoothies that do not upset her intestines.  She reports fatigue and expresses hope that the Kathlen Mody is working, as she was told her unspecified condition was shrinking. She also mentions a muscle relaxant that was recently increased in dosage.     ALLERGIES:  is allergic to duloxetine, keflex [cephalexin], tramadol, latex, and naproxen.  MEDICATIONS:  Current Outpatient Medications  Medication Sig Dispense Refill   abemaciclib (VERZENIO) 50 MG tablet Take 1 tablet (50 mg total) by mouth 2 (two) times daily. Swallow tablets whole. Do not chew, crush, or split tablets before swallowing. 56 tablet 5   ACCU-CHEK GUIDE test strip USE TO MONITOR BLOOD GLUCOSE 3 TIME(S) DAILY     Baclofen 5 MG TABS Take 3 tablets (15 mg total) by mouth 3 (three) times daily. 180 tablet 2   CREON 24000-76000 units CPEP Take by mouth. Take 2 capsules by mouth 3 times daily with meals. Take 2 capsules with meals and 1 with snacks     glipiZIDE (GLUCOTROL XL) 2.5 MG 24 hr tablet Take 1 tablet (2.5 mg total) by mouth daily. 30 tablet 1   glipiZIDE (GLUCOTROL XL) 2.5 MG 24 hr tablet Take 1 tablet (2.5 mg total) by mouth daily. 30 tablet 2   Glucose Blood (BLOOD GLUCOSE TEST STRIPS) STRP Use to check blood sugar as directed 100 each 5   hydrocortisone (ANUSOL-HC) 2.5 % rectal cream Apply rectally 2 times daily 30 g 1   HYDROmorphone (DILAUDID) 2 MG tablet Take 1 tablet (2 mg total) by mouth every 6 (six) hours as needed for severe pain (pain score 7-10). 60 tablet 0   letrozole  (FEMARA) 2.5 MG tablet Take 1 tablet (2.5 mg total) by mouth daily. 90 tablet 3   ondansetron (ZOFRAN-ODT) 8 MG disintegrating tablet Take 1 tablet (8 mg total) by mouth every 8 (eight) hours as needed for nausea or vomiting. 60 tablet 4   pantoprazole (PROTONIX) 40 MG tablet Take 1 tablet (40 mg total) by mouth daily. 30 tablet 11   potassium chloride SA (KLOR-CON M) 20 MEQ tablet Take 1 tablet (20 mEq total) by mouth 2 (two) times daily for 7 days. 14 tablet 0   pregabalin (LYRICA) 100 MG capsule Take 1 capsule (100 mg total) by mouth 2 (two) times daily. 60 capsule 1   sucralfate (CARAFATE) 1 GM/10ML suspension Take 10 mLs (1 g total) by mouth 4 (four) times daily before meals and nightly 1200 mL 11   triamcinolone cream (KENALOG) 0.1 % Apply 1 Application topically 2 (two) times daily as needed. 40 g 1   Current Facility-Administered Medications  Medication Dose Route Frequency Provider Last Rate Last Admin   0.9 %  sodium chloride infusion  500 mL Intravenous Continuous Imogene Burn, MD        PHYSICAL EXAMINATION: ECOG PERFORMANCE STATUS: 1 - Symptomatic but completely ambulatory  Vitals:   01/01/24 1445  BP: 125/69  Pulse: 67  Resp: 16  Temp: 98.4  F (36.9 C)  SpO2: 100%   Filed Weights   01/01/24 1445  Weight: 166 lb 3.2 oz (75.4 kg)    Physical Exam   (exam performed in the presence of a chaperone)  LABORATORY DATA:  I have reviewed the data as listed    Latest Ref Rng & Units 01/01/2024    2:05 PM 12/03/2023    7:56 AM 11/21/2023    8:27 AM  CMP  Glucose 70 - 99 mg/dL 161  88  92   BUN 6 - 20 mg/dL 18  9  8    Creatinine 0.44 - 1.00 mg/dL 0.96  0.45  4.09   Sodium 135 - 145 mmol/L 143  144  143   Potassium 3.5 - 5.1 mmol/L 3.6  3.8  3.8   Chloride 98 - 111 mmol/L 111  110  110   CO2 22 - 32 mmol/L 28  26  28    Calcium 8.9 - 10.3 mg/dL 8.9  9.3  8.8   Total Protein 6.5 - 8.1 g/dL 7.0  8.1  6.9   Total Bilirubin 0.0 - 1.2 mg/dL 0.4  0.4  0.4   Alkaline Phos 38  - 126 U/L 69  88  66   AST 15 - 41 U/L 11  14  12    ALT 0 - 44 U/L 13  13  10      Lab Results  Component Value Date   WBC 4.1 01/01/2024   HGB 11.8 (L) 01/01/2024   HCT 35.2 (L) 01/01/2024   MCV 93.6 01/01/2024   PLT 167 01/01/2024   NEUTROABS 2.4 01/01/2024    ASSESSMENT & PLAN:  Malignant neoplasm of upper-outer quadrant of right breast in female, estrogen receptor positive (HCC) 07/28/2020:Patient palpated a right breast mass for 1-2 years. Mammogram showed a 2.2cm mass at the 11 o'clock position with surrounding calcifications, 6.4cm in total extent, and up to 5 abnormal right axillary lymph nodes. 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   Treatment plan: 1. Neoadjuvant chemotherapy (MammaPrint test High Risk): AC foll by Taxol completed 01/11/21 2. 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 Adjuvant radiation therapy 05/11/2021-07/04/2021 4. Follow-up adjuvant antiestrogen therapy along with abemaciclib (patient has total of 4 lymph nodes positive), but she declined because of concern for toxicities URCC 16070: Treatment of refractory nausea 5. ALND 03/28/21: 3/7 LN positive -------------------------------------------------------------------------------------------------------------------------------  Current treatment: Zoladex plus letrozole (patient did not want to start Verzenio because of concern for toxicities).  Zoladex discontinued status post bilateral salpingo-oophorectomy on 02/22/2023   Patient stopped taking letrozole until the stomach cancer surgery happens. 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  (CTNNA1 gene mutation) Postop complications: Weight loss 131 pounds, nausea and vomiting   Bone Density: 08/31/21: T Score 1.1 (Normal) Mammograms: 11/13/2022: Benign, density Cat B CT CAP 09/07/2023: Increase in size of the liver lesion 3.9 cm (used to be 1.8 cm), possibly new lesion 0.6 cm  Biopsy at [facility]: Consistent with metastatic breast cancer ER 92%, PR 98%, HER2 0   Treatment Plan: Verzenio with Letrozole (  started 10/03/2023), dose reduced to 50 p.o. twice daily 11/22/2023    Toxicities: Neutropenia: With dose reduction neutrophil count has normalized. Patient has chronic diarrhea which is unchanged Hypokalemia: Will increase potassium containing foods.  Scans will be ordered for May 2025. ------------------------------------- Assessment and Plan Assessment & Plan Malignant neoplasm of upper-outer quadrant of right breast, estrogen receptor positive On Verzenio with mild weekly diarrhea and swelling in legs, hands, and abdomen. Labs show stable white blood cell count, normal hemoglobin, and good renal and hepatic function. Prefers no dose adjustments. - Continue Verzenio as prescribed. - Advise using Imodium on days with diarrhea. - Monitor for swelling; contact [facility] if persistent. - Order CT scan to assess cancer status, schedule early morning.  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 Smoothies tolerated well. - Continue current dietary adjustments. - Monitor fatigue and adjust activities as needed.      No orders of the defined types were placed in this encounter.  The patient has a good understanding of the overall plan. she agrees with it. she will call with any problems that may develop before the next visit here. Total time spent: 30 mins including face to face time and time  spent for planning, charting and co-ordination of care   Viinay K Jhaden Pizzuto, MD 01/01/24

## 2024-01-01 NOTE — Assessment & Plan Note (Signed)
 07/28/2020:Patient palpated a right breast mass for 1-2 years. Mammogram showed a 2.2cm mass at the 11 o'clock position with surrounding calcifications, 6.4cm in total extent, and up to 5 abnormal right axillary lymph nodes. 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   Treatment plan: 1. Neoadjuvant chemotherapy (MammaPrint test High Risk): AC foll by Taxol completed 01/11/21 2. 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 Adjuvant radiation therapy 05/11/2021-07/04/2021 4. Follow-up adjuvant antiestrogen therapy along with abemaciclib (patient has total of 4 lymph nodes positive), but she declined because of concern for toxicities URCC 16070: Treatment of refractory nausea 5. ALND 03/28/21: 3/7 LN positive -------------------------------------------------------------------------------------------------------------------------------  Current treatment: Zoladex plus letrozole (patient did not want to start Verzenio because of concern for toxicities).  Zoladex discontinued status post bilateral salpingo-oophorectomy on 02/22/2023   Patient stopped taking letrozole until the stomach cancer surgery happens. 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  (CTNNA1 gene mutation) Postop complications: Weight loss 131 pounds, nausea and vomiting   Bone Density: 08/31/21: T Score 1.1 (Normal) Mammograms: 11/13/2022: Benign, density Cat B CT CAP 09/07/2023: Increase in size of the liver lesion 3.9 cm (used to be 1.8 cm), possibly new lesion 0.6 cm  Biopsy at  [facility]: Consistent with metastatic breast cancer ER 92%, PR 98%, HER2 0   Treatment Plan: Verzenio with Letrozole (started 10/03/2023), dose reduced to 50 p.o. twice daily 11/22/2023    Toxicities: Neutropenia: With dose reduction neutrophil count has normalized. Patient has chronic diarrhea which is unchanged Hypokalemia: Will increase potassium containing foods.  Scans will be ordered for May 2025.

## 2024-01-02 LAB — CANCER ANTIGEN 27.29: CA 27.29: 85.1 U/mL — ABNORMAL HIGH (ref 0.0–38.6)

## 2024-01-03 ENCOUNTER — Inpatient Hospital Stay (HOSPITAL_BASED_OUTPATIENT_CLINIC_OR_DEPARTMENT_OTHER): Admitting: Nurse Practitioner

## 2024-01-03 ENCOUNTER — Encounter: Payer: Self-pay | Admitting: Nurse Practitioner

## 2024-01-03 ENCOUNTER — Other Ambulatory Visit (HOSPITAL_COMMUNITY): Payer: Self-pay

## 2024-01-03 ENCOUNTER — Inpatient Hospital Stay: Admitting: Licensed Clinical Social Worker

## 2024-01-03 VITALS — BP 153/82 | HR 59 | Temp 97.9°F | Resp 18 | Ht 64.0 in | Wt 166.8 lb

## 2024-01-03 DIAGNOSIS — Z170 Estrogen receptor positive status [ER+]: Secondary | ICD-10-CM

## 2024-01-03 DIAGNOSIS — G893 Neoplasm related pain (acute) (chronic): Secondary | ICD-10-CM | POA: Diagnosis not present

## 2024-01-03 DIAGNOSIS — R530 Neoplastic (malignant) related fatigue: Secondary | ICD-10-CM

## 2024-01-03 DIAGNOSIS — F419 Anxiety disorder, unspecified: Secondary | ICD-10-CM | POA: Diagnosis not present

## 2024-01-03 DIAGNOSIS — C50411 Malignant neoplasm of upper-outer quadrant of right female breast: Secondary | ICD-10-CM

## 2024-01-03 DIAGNOSIS — Z515 Encounter for palliative care: Secondary | ICD-10-CM

## 2024-01-03 MED ORDER — HYDROMORPHONE HCL 2 MG PO TABS
2.0000 mg | ORAL_TABLET | Freq: Four times a day (QID) | ORAL | 0 refills | Status: DC | PRN
  Filled 2024-01-03: qty 60, 15d supply, fill #0

## 2024-01-03 NOTE — Progress Notes (Signed)
 Palliative Medicine [facility]  Telephone:(336) [phone] [phone]   Name: Melody Rodriguez Date: 01/03/2024 MRN: [id number]  DOB: 20-Apr-1976  Patient Care Team: Modesta Messing as PCP - General (Physician Assistant) Abigail Miyamoto, MD as Consulting Physician (General Surgery) Serena Croissant, MD as Consulting Physician (Hematology and Oncology) Antony Blackbird, MD as Consulting Physician (Radiation Oncology) Pickenpack-Cousar, Arty Baumgartner, NP as Nurse Practitioner (Hospice and Palliative Medicine)    INTERVAL HISTORY: Melody Rodriguez is a 48 y.o. 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  Palliative ask to see for symptom management and goals of care.  SOCIAL HISTORY:     reports that she has quit smoking. Her smoking use included cigars and cigarettes. She has a 22 pack-year smoking history. She has never used smokeless tobacco. She reports that she does not currently use alcohol. She reports that she does not currently use drugs after having used the following drugs: Marijuana.  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  MEDICATIONS:  Current Outpatient Medications  Medication Sig Dispense Refill   abemaciclib (VERZENIO) 50 MG tablet Take 1 tablet (50 mg total) by mouth 2 (two) times daily. Swallow tablets whole. Do not chew, crush, or split tablets before swallowing. 56 tablet 5   ACCU-CHEK GUIDE test strip USE TO MONITOR BLOOD GLUCOSE 3 TIME(S) DAILY     Baclofen 5 MG TABS Take 3 tablets (15 mg total) by mouth 3 (three) times daily. 180 tablet 2   CREON 24000-76000 units CPEP Take by mouth. Take 2 capsules by mouth 3 times daily with meals. Take 2 capsules with meals and 1 with snacks     glipiZIDE (GLUCOTROL XL) 2.5 MG 24 hr tablet Take 1 tablet (2.5 mg total) by mouth daily. 30 tablet 1   glipiZIDE (GLUCOTROL XL) 2.5 MG 24 hr tablet Take 1 tablet (2.5 mg total) by mouth daily. 30 tablet 2   Glucose Blood (BLOOD GLUCOSE TEST STRIPS) STRP Use to check blood sugar as directed 100 each 5   hydrocortisone (ANUSOL-HC) 2.5 % rectal cream Apply rectally 2 times daily 30 g 1   HYDROmorphone (DILAUDID) 2 MG tablet Take 1 tablet (2 mg total) by mouth every 6 (six) hours as needed for severe pain (pain score 7-10). 60 tablet 0   letrozole (FEMARA) 2.5 MG tablet Take 1 tablet (2.5 mg total) by mouth daily. 90 tablet 3   ondansetron (ZOFRAN-ODT) 8 MG disintegrating tablet Take 1 tablet (8 mg total) by mouth every 8 (eight) hours  as needed for nausea or vomiting. 60 tablet 4   pantoprazole (PROTONIX) 40 MG tablet Take 1 tablet (40 mg total) by mouth daily. 30 tablet 11   potassium chloride SA (KLOR-CON M) 20 MEQ tablet Take 1 tablet (20 mEq total) by mouth 2 (two) times daily for 7 days. 14 tablet 0   pregabalin (LYRICA) 100 MG capsule Take 1 capsule (100 mg total) by mouth 2 (two) times daily. 60 capsule 1   sucralfate (CARAFATE) 1 GM/10ML suspension Take 10 mLs (1 g total) by mouth 4 (four) times daily before meals and nightly 1200 mL 11   triamcinolone cream (KENALOG) 0.1 % Apply 1 Application  topically 2 (two) times daily as needed. 40 g 1   Current Facility-Administered Medications  Medication Dose Route Frequency Provider Last Rate Last Admin   0.9 %  sodium chloride infusion  500 mL Intravenous Continuous Imogene Burn, MD        VITAL SIGNS: BP (!) 153/82 (BP Location: Left Arm, Patient Position: Sitting)   Pulse (!) 59   Temp 97.9 F (36.6 C) (Tympanic)   Resp 18   Ht 5\' 4"  (1.626 m)   Wt 166 lb 12.8 oz (75.7 kg)   SpO2 100%   BMI 28.63 kg/m  Filed Weights   01/03/24 1041  Weight: 166 lb 12.8 oz (75.7 kg)    Estimated body mass index is 28.63 kg/m as calculated from the following:   Height as of this encounter: 5\' 4"  (1.626 m).   Weight as of this encounter: 166 lb 12.8 oz (75.7 kg).   PERFORMANCE STATUS (ECOG) : 1 - Symptomatic but completely ambulatory   Physical Exam General: NAD Cardiovascular: regular rate and rhythm Pulmonary: normal breathing pattern Abdomen: soft, nontender, + bowel sounds Extremities: no edema, no joint deformities Skin: no rashes Neurological:   Discussed the use of AI scribe software for clinical note transcription with the patient, who gave verbal consent to proceed. History of Present Illness Melody Rodriguez is a 48 year old female who presents  to clinic for follow-up. No acute distress.  Denies concerns with nausea, vomiting, constipation, or diarrhea. Ambulates with a rollator.  Tries to remain as active as possible. She is emotional during visit today discussing life challenges and world problems. Emotional support provided. Her son is currently visit family this week allowing her some self time.   Melody Rodriguez reports her pain is well-controlled with current regimen.  Currently taking hydromorphone 2 mg every 6 hours as needed, Lyrica 100 mg twice daily, baclofen 10 mg 3 times daily as needed. Does not require use of hydromorphone around the clock.  Refills are appropriate. Last prescription 3/28. No changes to current regimen  at this time.  We will continue to closely monitor.  All questions answered and support provided.     I discussed the importance of continued conversation with family and their medical providers regarding overall plan of care and treatment options, ensuring decisions are within the context of the patients values and GOCs. Assessment & Plan Cancer Related Pain Pain well-controlled on current regimen. -Continue hydromorphone 2 mg every 6 hours as needed. -Continue Lyrica 100 mg twice daily. Continue baclofen 15 mg 3 times daily.  Insomnia Difficulty sleeping, especially pre-procedures. Denies anxiety as primary cause. Considering muscle relaxer adjustment. -Education provided on ways to focus on relaxation. - Consider alternative or additional sleep aids if insomnia persists.  Financial and Social Stress -Patient is actively engaged with Marcelino Duster, [place]  I  will plan to see her back in 6-8 weeks.  Sooner if needed.   Patient expressed understanding and was in agreement with this plan. She also understands that She can call the clinic at any time with any questions, concerns, or complaints.   Any controlled substances utilized were prescribed in the context of palliative care. PDMP has been reviewed.   Visit consisted of counseling and education dealing with the complex and emotionally intense issues of symptom management and palliative care in the setting of serious and potentially life-threatening illness.  Dellia Ferguson, AGPCNP-BC  Palliative Medicine Team/[facility]

## 2024-01-03 NOTE — Progress Notes (Signed)
 [facility] CSW Counseling Note  Patient was referred by self. Treatment type: Individual  Presenting Concerns: Patient and/or family reports the following symptoms/concerns:  adjustment to stage IV cancer diagnosis Duration of problem: 3 months; Severity of problem: moderate   Orientation:oriented to person, place, time/date, and situation.   Affect: Appropriate and Congruent Risk of harm to self or others: No plan to harm self or others  Patient and/or Family's Strengths/Protective Factors: Ability for insight  Capable of independent living  Communication skills      Goals Addressed: Patient will:  Increase knowledge and/or ability of: coping skills  Increase healthy adjustment to current life circumstances and process new diagnosis   Progress towards Goals: Progressing   Interventions: Interventions utilized:  CBT and Strength-based      Assessment: Pt reports increased fatigue today- both mental and physical.  Sleep has been difficult lately.  Pt has had improvements in eating and drinking. She has had her aide reinstated but for fewer hours (59/month).  Continued processing frustration of family not stepping up to support in the way pt desired, especially during her stomach cancer.    CSW utilized CBTstrategies to work with pt on framing and perspective. Utilized "and" statements to recognize that it is frustrating to not be where she was prior to cancer and to have gratitude for improvements since September.  Continued discussion on pacing activities, even when having energy bursts, to avoid long recovery periods. Emphasized use of active relaxation with deep breathing to help release tension.     Plan: Follow up with CSW: 01/22/2024 9:30am Behavioral recommendations: Continue pacing activities, even when having energy bursts. Work on reframing, "and" statements, and looking for positives. Use deep breathing to help release tension  Referral(s): [facility]; [facility]; [place]       Kobi Mario E [place], LCSW

## 2024-01-03 NOTE — Addendum Note (Signed)
 Addended by: PICKENPACK-COUSAR, Rhyland Hinderliter N on: 01/03/2024 03:31 PM   Modules accepted: Orders

## 2024-01-04 ENCOUNTER — Other Ambulatory Visit: Payer: Self-pay

## 2024-01-04 NOTE — Progress Notes (Signed)
 Specialty Pharmacy Ongoing Clinical Assessment Note  Melody Rodriguez is a 48 y.o. female who is being followed by the specialty pharmacy service for RxSp Oncology   Patient's specialty medication(s) reviewed today: Abemaciclib  (VERZENIO )   Missed doses in the last 4 weeks: 0   Patient/Caregiver did not have any additional questions or concerns.   Therapeutic benefit summary: Unable to assess   Adverse events/side effects summary: Experienced adverse events/side effects (diarrhea per provider notes and extremity swelling - provider aware)   Patient's therapy is appropriate to: Continue    Goals Addressed             This Visit's Progress    Maintain optimal adherence to therapy   On track    Patient is initiating therapy. Patient will maintain adherence        Follow up:  3 months  Feliciana Narayan M Martyn Timme Specialty Pharmacist

## 2024-01-04 NOTE — Progress Notes (Signed)
 Specialty Pharmacy Refill Coordination Note  Melody Rodriguez is a 48 y.o. female contacted today regarding refills of specialty medication(s) Abemaciclib  (VERZENIO )   Patient requested Delivery   Delivery date: 01/14/24   Verified address: [address]  [place] [address]   Medication will be filled on 01/11/24.

## 2024-01-07 ENCOUNTER — Encounter: Payer: Self-pay | Admitting: Hematology and Oncology

## 2024-01-09 NOTE — Addendum Note (Signed)
 Encounter addended by: Karolynn Pack on: 01/09/2024 3:35 PM  Actions taken: Imaging Exam ended

## 2024-01-11 ENCOUNTER — Other Ambulatory Visit: Payer: Self-pay

## 2024-01-14 ENCOUNTER — Other Ambulatory Visit (HOSPITAL_COMMUNITY): Payer: Self-pay

## 2024-01-22 ENCOUNTER — Inpatient Hospital Stay

## 2024-01-22 ENCOUNTER — Inpatient Hospital Stay: Attending: Hematology and Oncology | Admitting: Licensed Clinical Social Worker

## 2024-01-22 ENCOUNTER — Ambulatory Visit (HOSPITAL_COMMUNITY)
Admission: RE | Admit: 2024-01-22 | Discharge: 2024-01-22 | Disposition: A | Discharge status: Home / Self Care | Source: Ambulatory Visit | Attending: Hematology and Oncology | Admitting: Hematology and Oncology

## 2024-01-22 DIAGNOSIS — C50411 Malignant neoplasm of upper-outer quadrant of right female breast: Secondary | ICD-10-CM | POA: Insufficient documentation

## 2024-01-22 DIAGNOSIS — F17210 Nicotine dependence, cigarettes, uncomplicated: Secondary | ICD-10-CM | POA: Diagnosis not present

## 2024-01-22 DIAGNOSIS — Z79899 Other long term (current) drug therapy: Secondary | ICD-10-CM | POA: Diagnosis not present

## 2024-01-22 DIAGNOSIS — Z170 Estrogen receptor positive status [ER+]: Secondary | ICD-10-CM | POA: Insufficient documentation

## 2024-01-22 DIAGNOSIS — C773 Secondary and unspecified malignant neoplasm of axilla and upper limb lymph nodes: Secondary | ICD-10-CM | POA: Diagnosis present

## 2024-01-22 DIAGNOSIS — Z1721 Progesterone receptor positive status: Secondary | ICD-10-CM | POA: Insufficient documentation

## 2024-01-22 DIAGNOSIS — Z79811 Long term (current) use of aromatase inhibitors: Secondary | ICD-10-CM | POA: Diagnosis not present

## 2024-01-22 LAB — CMP (CANCER CENTER ONLY)
ALT: 13 U/L (ref 0–44)
AST: 15 U/L (ref 15–41)
Albumin: 4.3 g/dL (ref 3.5–5.0)
Alkaline Phosphatase: 74 U/L (ref 38–126)
Anion gap: 4 — ABNORMAL LOW (ref 5–15)
BUN: 11 mg/dL (ref 6–20)
CO2: 29 mmol/L (ref 22–32)
Calcium: 9.2 mg/dL (ref 8.9–10.3)
Chloride: 109 mmol/L (ref 98–111)
Creatinine: 0.72 mg/dL (ref 0.44–1.00)
GFR, Estimated: >60 mL/min (ref >60)
Glucose, Bld: 84 mg/dL (ref 70–99)
Potassium: 3.9 mmol/L (ref 3.5–5.1)
Sodium: 142 mmol/L (ref 135–145)
Total Bilirubin: 0.5 mg/dL (ref 0.0–1.2)
Total Protein: 7.2 g/dL (ref 6.5–8.1)

## 2024-01-22 LAB — CBC WITH DIFFERENTIAL (CANCER CENTER ONLY)
Abs Immature Granulocytes: 0 10*3/uL (ref 0.00–0.07)
Basophils Absolute: 0.1 10*3/uL (ref 0.0–0.1)
Basophils Relative: 1 %
Eosinophils Absolute: 0.1 10*3/uL (ref 0.0–0.5)
Eosinophils Relative: 3 %
HCT: 34.5 % — ABNORMAL LOW (ref 36.0–46.0)
Hemoglobin: 12 g/dL (ref 12.0–15.0)
Immature Granulocytes: 0 %
Lymphocytes Relative: 35 %
Lymphs Abs: 1.5 10*3/uL (ref 0.7–4.0)
MCH: 32 pg (ref 26.0–34.0)
MCHC: 34.8 g/dL (ref 30.0–36.0)
MCV: 92 fL (ref 80.0–100.0)
Monocytes Absolute: 0.2 10*3/uL (ref 0.1–1.0)
Monocytes Relative: 5 %
Neutro Abs: 2.3 10*3/uL (ref 1.7–7.7)
Neutrophils Relative %: 56 %
Platelet Count: 169 10*3/uL (ref 150–400)
RBC: 3.75 MIL/uL — ABNORMAL LOW (ref 3.87–5.11)
RDW: 13.1 % (ref 11.5–15.5)
WBC Count: 4.1 10*3/uL (ref 4.0–10.5)
nRBC: 0 % (ref 0.0–0.2)

## 2024-01-22 MED ORDER — HEPARIN SOD (PORK) LOCK FLUSH 100 UNIT/ML IV SOLN
500.0000 [IU] | Freq: Once | INTRAVENOUS | Status: AC
  Administered 2024-01-22: 500 [IU] via INTRAVENOUS

## 2024-01-22 MED ORDER — SODIUM CHLORIDE 0.9% FLUSH
10.0000 mL | Freq: Once | INTRAVENOUS | Status: AC
  Administered 2024-01-22: 10 mL

## 2024-01-22 MED ORDER — HEPARIN SOD (PORK) LOCK FLUSH 100 UNIT/ML IV SOLN
INTRAVENOUS | Status: AC
  Filled 2024-01-22: qty 5

## 2024-01-22 MED ORDER — IOHEXOL 300 MG/ML  SOLN
100.0000 mL | Freq: Once | INTRAMUSCULAR | Status: AC | PRN
  Administered 2024-01-22: 100 mL via INTRAVENOUS

## 2024-01-22 NOTE — Progress Notes (Signed)
 [facility] CSW Counseling Note  Patient was referred by self. Treatment type: Individual  Presenting Concerns: Patient and/or family reports the following symptoms/concerns:  adjustment to stage IV cancer diagnosis ; family dynamics Duration of problem: 3 months; Severity of problem: moderate   Orientation:oriented to person, place, time/date, and situation.   Affect: Appropriate, Congruent, and Tearful Risk of harm to self or others: No plan to harm self or others  Patient and/or Family's Strengths/Protective Factors: Ability for insight  Capable of independent living  Communication skills      Goals Addressed: Patient will:  Increase knowledge and/or ability of: coping skills  Increase healthy adjustment to current life circumstances and process new diagnosis   Progress towards Goals: Progressing   Interventions: Interventions utilized:  CBT and Strength-based      Assessment: Pt continues to balance energy maintenance vs expenditure.  Majority of session was devoted to processing family dynamics and past trauma, especially related to the situation with her older son's upbringing. CSW provided space for processing and recognizing grief and loss while acknowledging that the pt did the best she could with the information and situation she was in at the time. Utilized strength-based lens to identify how pt is working to change patterns now.    Plan: Follow up with CSW: 3 weeks Behavioral recommendations: Continue pacing activities, even when having energy bursts. Work on reframing, "and" statements, and looking for positives. Use deep breathing to help release tension. Remember that hindsight is clearer and that you did the best you could with the information you had at the time Referral(s): Advance Directives clinic; [facility] [address] [facility]; [place]       Madden Garron E [place], LCSW

## 2024-01-23 LAB — CANCER ANTIGEN 27.29: CA 27.29: 73.3 U/mL — ABNORMAL HIGH (ref 0.0–38.6)

## 2024-01-29 ENCOUNTER — Inpatient Hospital Stay (HOSPITAL_BASED_OUTPATIENT_CLINIC_OR_DEPARTMENT_OTHER): Admitting: Nurse Practitioner

## 2024-01-29 ENCOUNTER — Other Ambulatory Visit (HOSPITAL_COMMUNITY): Payer: Self-pay

## 2024-01-29 ENCOUNTER — Inpatient Hospital Stay (HOSPITAL_BASED_OUTPATIENT_CLINIC_OR_DEPARTMENT_OTHER): Admitting: Hematology and Oncology

## 2024-01-29 ENCOUNTER — Encounter: Payer: Self-pay | Admitting: Nurse Practitioner

## 2024-01-29 VITALS — BP 102/64 | HR 94 | Temp 98.3°F | Resp 18 | Ht 64.0 in | Wt 164.7 lb

## 2024-01-29 DIAGNOSIS — M792 Neuralgia and neuritis, unspecified: Secondary | ICD-10-CM

## 2024-01-29 DIAGNOSIS — G893 Neoplasm related pain (acute) (chronic): Secondary | ICD-10-CM

## 2024-01-29 DIAGNOSIS — Z170 Estrogen receptor positive status [ER+]: Secondary | ICD-10-CM

## 2024-01-29 DIAGNOSIS — C50411 Malignant neoplasm of upper-outer quadrant of right female breast: Secondary | ICD-10-CM

## 2024-01-29 DIAGNOSIS — Z515 Encounter for palliative care: Secondary | ICD-10-CM | POA: Diagnosis not present

## 2024-01-29 DIAGNOSIS — R232 Flushing: Secondary | ICD-10-CM

## 2024-01-29 DIAGNOSIS — T451X5A Adverse effect of antineoplastic and immunosuppressive drugs, initial encounter: Secondary | ICD-10-CM

## 2024-01-29 DIAGNOSIS — R530 Neoplastic (malignant) related fatigue: Secondary | ICD-10-CM

## 2024-01-29 MED ORDER — VENLAFAXINE HCL ER 37.5 MG PO CP24
37.5000 mg | ORAL_CAPSULE | Freq: Every day | ORAL | 2 refills | Status: DC
  Filled 2024-01-29: qty 30, 30d supply, fill #0
  Filled 2024-02-22: qty 30, 30d supply, fill #1

## 2024-01-29 MED ORDER — PREGABALIN 150 MG PO CAPS
150.0000 mg | ORAL_CAPSULE | Freq: Two times a day (BID) | ORAL | 3 refills | Status: DC
  Filled 2024-01-29: qty 60, 30d supply, fill #0
  Filled 2024-03-07: qty 60, 30d supply, fill #1
  Filled 2024-04-10: qty 60, 30d supply, fill #2
  Filled 2024-05-14: qty 60, 30d supply, fill #3

## 2024-01-29 MED ORDER — HYDROMORPHONE HCL 2 MG PO TABS
2.0000 mg | ORAL_TABLET | Freq: Four times a day (QID) | ORAL | 0 refills | Status: DC | PRN
  Filled 2024-01-29: qty 60, 15d supply, fill #0

## 2024-01-29 NOTE — Progress Notes (Signed)
 Patient Care Team: Jackson, Kerra J, PA-C as PCP - General (Physician Assistant) Oza Blumenthal, MD as Consulting Physician (General Surgery) Cameron Cea, MD as Consulting Physician (Hematology and Oncology) Retta Caster, MD as Consulting Physician (Radiation Oncology) Pickenpack-Cousar, Giles Labrum, NP as Nurse Practitioner [facility])  DIAGNOSIS:  Encounter Diagnosis  Name Primary?   Malignant neoplasm of upper-outer quadrant of right breast in female, estrogen receptor positive (HCC) Yes    SUMMARY OF ONCOLOGIC HISTORY: Oncology History  Malignant neoplasm of upper-outer quadrant of right breast in female, estrogen receptor positive (HCC)  07/28/2020 Initial Diagnosis   Patient palpated a right breast mass for 1-2 years. Mammogram showed a 2.2cm mass at the 11 o'clock position with surrounding calcifications, 6.4cm in total extent, and up to 5 abnormal right axillary lymph nodes. 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    08/05/2020 Miscellaneous   MammaPrint: High risk luminal type B   08/11/2020 Genetic Testing   Negative genetic testing: no pathogenic variants detected in [facility] Breast Cancer STAT Panel or Common Hereditary Cancers panel. The report dates are August 11, 2020 and August 19, 2020, respectively. Two variants of uncertain signficance were detected - one in the CTNNA1 gene called c.86del and the second in the MLH1 gene called c.808A>G.   UPDATE:  The MLH1 c.808A>G VUS was reclassified to "Likely Benign" on 02/19/2021. The change in variant classification was made as a result of re-review of the evidence in light of new variant interpretation guidelines and/or new information.   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 The amended report date is February 16, 2022.   The STAT  Breast cancer panel offered by [facility] includes sequencing and rearrangement analysis for the following 9 genes:  ATM, BRCA1, BRCA2, CDH1, CHEK2, PALB2, PTEN, STK11 and TP53.  The Common Hereditary Cancers Panel offered by [facility] includes sequencing and/or deletion duplication testing of the following 48 genes: APC, ATM, AXIN2, BARD1, BMPR1A, BRCA1, BRCA2, BRIP1, CDH1, CDK4, CDKN2A (p14ARF), CDKN2A (p16INK4a), CHEK2, CTNNA1, DICER1, EPCAM (Deletion/duplication testing only), GREM1 (promoter region deletion/duplication testing only), KIT, MEN1, MLH1, MSH2, MSH3, MSH6, MUTYH, NBN, NF1, NTHL1, PALB2, PDGFRA, PMS2, POLD1, POLE, PTEN, RAD50, RAD51C, RAD51D, RNF43, SDHB, SDHC, SDHD, SMAD4, SMARCA4. STK11, TP53, TSC1, TSC2, and VHL.  The following genes were evaluated for sequence changes only: SDHA and HOXB13 c.251G>A variant only.    09/01/2020 - 01/11/2021 Neo-Adjuvant Chemotherapy   Adriamycin  and Cytoxan  x4 09/01/2020-10/12/2020 Weekly Taxol  x 12  10/26/2020-01/11/2021(dose reduced d/t AE)   02/07/2021 Surgery   Right lumpectomy Lucienne Ryder): invasive and in situ ductal carcinoma, 2.8cm, clear margins, with metastatic carcinoma in 1/1 right axillary lymph nodes.   03/28/2021 Surgery   Axillary lymph node dissection: 3/7 lymph nodes +   05/10/2021 - 07/01/2021 Radiation Therapy   Site Technique Total Dose (Gy) Dose per Fx (Gy) Completed Fx Beam Energies  Breast, Right: Breast_Rt 3D 50.4/50.4 1.8 28/28 10X  Breast, Right: Breast_Rt_Bst specialPort 12/12 2 6/6 15E  Sclav-RT: SCV_Rt 3D 50.4/50.4 1.8 28/28      07/2021 -  Anti-estrogen oral therapy   Zoladex  + Letrozole  + Verzenio      CHIEF COMPLIANT: Follow-up on recent CT scans  HISTORY OF PRESENT ILLNESS:   History of Present Illness Melody Rodriguez is a 48 year old female with metastatic liver cancer who presents  for follow-up on tumor response to treatment.  Recent imaging shows a reduction in liver tumor size from 3.9 cm to 1.7 cm, with  decreasing tumor markers. She is on Verzenio  and letrozole . She experiences joint pain, swelling in her hands and feet, and balance issues, which she attributes to her medication. She wakes up with swollen hands and aching joints.  She has a cyst in her mouth associated with her wisdom tooth, causing difficulty swallowing and chewing on one side. She has contacted her dentist but does not have an appointment until June 2nd.  She experiences sleep disturbances, waking up at 10 or 11 PM and staying awake until morning, which she attributes to joint pain and general discomfort.     ALLERGIES:  is allergic to duloxetine , keflex [cephalexin], tramadol , latex, and naproxen.  MEDICATIONS:  Current Outpatient Medications  Medication Sig Dispense Refill   abemaciclib  (VERZENIO ) 50 MG tablet Take 1 tablet (50 mg total) by mouth 2 (two) times daily. Swallow tablets whole. Do not chew, crush, or split tablets before swallowing. 56 tablet 5   ACCU-CHEK GUIDE test strip USE TO MONITOR BLOOD GLUCOSE 3 TIME(S) DAILY     Baclofen  5 MG TABS Take 3 tablets (15 mg total) by mouth 3 (three) times daily. 180 tablet 2   CREON 24000-76000 units CPEP Take by mouth. Take 2 capsules by mouth 3 times daily with meals. Take 2 capsules with meals and 1 with snacks     glipiZIDE  (GLUCOTROL  XL) 2.5 MG 24 hr tablet Take 1 tablet (2.5 mg total) by mouth daily. 30 tablet 1   glipiZIDE  (GLUCOTROL  XL) 2.5 MG 24 hr tablet Take 1 tablet (2.5 mg total) by mouth daily. 30 tablet 2   Glucose Blood (BLOOD GLUCOSE TEST STRIPS) STRP Use to check blood sugar as directed 100 each 5   hydrocortisone  (ANUSOL -HC) 2.5 % rectal cream Apply rectally 2 times daily 30 g 1   HYDROmorphone  (DILAUDID ) 2 MG tablet Take 1 tablet (2 mg total) by mouth every 6 (six) hours as needed for severe pain (pain score 7-10). 60 tablet 0   letrozole  (FEMARA ) 2.5 MG tablet Take 1 tablet (2.5 mg total) by mouth daily. 90 tablet 3   ondansetron  (ZOFRAN -ODT) 8 MG  disintegrating tablet Take 1 tablet (8 mg total) by mouth every 8 (eight) hours as needed for nausea or vomiting. 60 tablet 4   pantoprazole  (PROTONIX ) 40 MG tablet Take 1 tablet (40 mg total) by mouth daily. 30 tablet 11   pregabalin  (LYRICA ) 100 MG capsule Take 1 capsule (100 mg total) by mouth 2 (two) times daily. 60 capsule 1   sucralfate  (CARAFATE ) 1 GM/10ML suspension Take 10 mLs (1 g total) by mouth 4 (four) times daily before meals and nightly 1200 mL 11   triamcinolone  cream (KENALOG ) 0.1 % Apply 1 Application topically 2 (two) times daily as needed. 40 g 1   potassium chloride  SA (KLOR-CON  M) 20 MEQ tablet Take 1 tablet (20 mEq total) by mouth 2 (two) times daily for 7 days. 14 tablet 0   Current Facility-Administered Medications  Medication Dose Route Frequency Provider Last Rate Last Admin   0.9 %  sodium chloride  infusion  500 mL Intravenous Continuous Daina Drum, MD        PHYSICAL EXAMINATION: ECOG PERFORMANCE STATUS: 1 - Symptomatic but completely ambulatory  Vitals:   01/29/24 1033  BP: 102/64  Pulse: 94  Resp: 18  Temp: 98.3 F (36.8 C)  SpO2: 99%  Filed Weights   01/29/24 1033  Weight: 164 lb 11.2 oz (74.7 kg)      LABORATORY DATA:  I have reviewed the data as listed    Latest Ref Rng & Units 01/22/2024    9:28 AM 01/01/2024    2:05 PM 12/03/2023    7:56 AM  CMP  Glucose 70 - 99 mg/dL 84  161  88   BUN 6 - 20 mg/dL 11  18  9    Creatinine 0.44 - 1.00 mg/dL 0.96  0.45  4.09   Sodium 135 - 145 mmol/L 142  143  144   Potassium 3.5 - 5.1 mmol/L 3.9  3.6  3.8   Chloride 98 - 111 mmol/L 109  111  110   CO2 22 - 32 mmol/L 29  28  26    Calcium  8.9 - 10.3 mg/dL 9.2  8.9  9.3   Total Protein 6.5 - 8.1 g/dL 7.2  7.0  8.1   Total Bilirubin 0.0 - 1.2 mg/dL 0.5  0.4  0.4   Alkaline Phos 38 - 126 U/L 74  69  88   AST 15 - 41 U/L 15  11  14    ALT 0 - 44 U/L 13  13  13      Lab Results  Component Value Date   WBC 4.1 01/22/2024   HGB 12.0 01/22/2024   HCT  34.5 (L) 01/22/2024   MCV 92.0 01/22/2024   PLT 169 01/22/2024   NEUTROABS 2.3 01/22/2024    ASSESSMENT & PLAN:  Malignant neoplasm of upper-outer quadrant of right breast in female, estrogen receptor positive (HCC) 07/28/2020:Patient palpated a right breast mass for 1-2 years. Mammogram showed a 2.2cm mass at the 11 o'clock position with surrounding calcifications, 6.4cm in total extent, and up to 5 abnormal right axillary lymph nodes. 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   Treatment plan: 1. Neoadjuvant chemotherapy (MammaPrint test High Risk): AC foll by Taxol  completed 01/11/21 2. 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 Adjuvant radiation therapy 05/11/2021-07/04/2021 4. Follow-up adjuvant antiestrogen therapy along with abemaciclib  (patient has total of 4 lymph nodes positive), but she declined because of concern for toxicities URCC 16070: Treatment of refractory nausea 5. ALND 03/28/21: 3/7 LN positive -------------------------------------------------------------------------------------------------------------------------------  Current treatment: Zoladex  plus letrozole  (patient did not want to start Verzenio  because of concern for toxicities).  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  (CTNNA1 gene mutation) Postop complications: Weight loss 131 pounds, nausea and vomiting   Bone Density: 08/31/21: T Score 1.1 (Normal) Mammograms:  11/13/2022: Benign, density Cat B CT CAP 09/07/2023: Increase in size of the liver lesion 3.9 cm (used to be 1.8 cm), possibly new lesion 0.6 cm  Biopsy at [facility]: Consistent with metastatic breast cancer ER 92%, PR 98%, HER2 0   Treatment Plan: Verzenio  with Letrozole  (started 10/03/2023), dose reduced to 50 p.o. twice daily 11/22/2023    Toxicities: Neutropenia: With dose reduction neutrophil count has normalized. Patient has chronic diarrhea which is  unchanged Hypokalemia: Will increase potassium containing foods.  She is drinking coconut water  for potassium   CT CAP 01/22/2024: Liver metastases 2.5 x 2.1 cm (overall decreased in size) Continue with the current treatment rescan in 3 months Return to clinic monthly for lab and follow-ups ------------------------------------- Assessment and Plan Assessment & Plan Liver tumor Liver tumor reduced from 3.9 cm to 1.7 cm, indicating positive treatment response. Other lesion likely benign, possibly due to fatty liver. - Continue current treatment regimen. - Schedule follow-up imaging to monitor tumor size.  Joint pain due to medication Joint pain and swelling likely due to Verzenio  and Letrozole , which are effective as tumor markers decrease. - Continue current medication regimen. - Monitor joint symptoms and consider medication adjustment if symptoms worsen.  Sleep disturbance Difficulty sleeping, possibly related to joint pain or other factors. - Consider interventions if sleep disturbance persists.      No orders of the defined types were placed in this encounter.  The patient has a good understanding of the overall plan. she agrees with it. she will call with any problems that may develop before the next visit here. Total time spent: 30 mins including face to face time and time spent for planning, charting and co-ordination of care   Viinay K Tameshia Bonneville, MD 01/29/24

## 2024-01-29 NOTE — Progress Notes (Signed)
 Palliative Medicine [facility]  [phone] [phone]   Name: Melody Rodriguez Date: 01/29/2024 MRN: [id number]  DOB: 15-Apr-1976  Patient Care Team: Sharalyn Dasen as PCP - General (Physician Assistant) Oza Blumenthal, MD as Consulting Physician (General Surgery) Cameron Cea, MD as Consulting Physician (Hematology and Oncology) Retta Caster, MD as Consulting Physician (Radiation Oncology) Pickenpack-Cousar, Giles Labrum, NP as Nurse Practitioner (Hospice and Palliative Medicine)    INTERVAL HISTORY: Melody Rodriguez is a 48 y.o. female with oncologic medical history including estrogen receptor positive metastatic breast cancer (07/2020) currently on Abemaciclib .  Palliative ask to see for symptom management and goals of care.  SOCIAL HISTORY:     reports that she has quit smoking. Her smoking use included cigars and cigarettes. She has a 22 pack-year smoking history. She has never used smokeless tobacco. She reports that she does not currently use alcohol. She reports that she does not currently use drugs after having used the following drugs: Marijuana.  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  MEDICATIONS:  Current Outpatient Medications  Medication Sig Dispense Refill   abemaciclib  (VERZENIO ) 50 MG tablet Take 1 tablet (50 mg total) by mouth 2 (two) times daily. Swallow tablets whole. Do not chew, crush, or split tablets before swallowing. 56 tablet 5   ACCU-CHEK GUIDE test strip USE TO MONITOR BLOOD GLUCOSE 3 TIME(S) DAILY     Baclofen  5 MG TABS Take 3 tablets (15 mg total) by mouth 3 (three) times daily. 180 tablet 2   CREON 24000-76000 units CPEP Take by mouth. Take 2 capsules by mouth 3 times daily with meals. Take 2 capsules with meals and 1 with snacks     glipiZIDE  (GLUCOTROL  XL) 2.5 MG 24 hr tablet Take 1 tablet (2.5 mg total) by mouth daily. 30 tablet 1   glipiZIDE  (GLUCOTROL  XL) 2.5 MG 24 hr tablet Take 1 tablet (2.5 mg total) by mouth daily. 30 tablet 2   Glucose Blood (BLOOD GLUCOSE TEST STRIPS) STRP Use to check blood sugar as directed 100 each 5   hydrocortisone  (ANUSOL -HC) 2.5 % rectal cream Apply rectally 2 times daily 30 g 1   HYDROmorphone  (DILAUDID ) 2 MG tablet Take 1 tablet (2 mg total) by mouth every 6 (six) hours as needed for severe pain (pain score 7-10). 60 tablet 0   letrozole  (FEMARA ) 2.5 MG tablet Take 1 tablet (2.5 mg total) by mouth daily. 90 tablet 3   ondansetron  (ZOFRAN -ODT) 8 MG disintegrating tablet Take 1 tablet (8 mg total) by mouth every 8 (eight) hours  as needed for nausea or vomiting. 60 tablet 4   pantoprazole  (PROTONIX ) 40 MG tablet Take 1 tablet (40 mg total) by mouth daily. 30 tablet 11   potassium chloride  SA (KLOR-CON  M) 20 MEQ tablet Take 1 tablet (20 mEq total) by mouth 2 (two) times daily for 7 days. 14 tablet 0   pregabalin  (LYRICA ) 150 MG capsule Take 1 capsule (150 mg total) by mouth 2 (two) times daily. 60 capsule 3   sucralfate  (CARAFATE ) 1 GM/10ML suspension Take 10 mLs (1 g total) by mouth 4 (four) times daily before meals and nightly 1200 mL 11   triamcinolone  cream (KENALOG ) 0.1 % Apply 1 Application  topically 2 (two) times daily as needed. 40 g 1   Current Facility-Administered Medications  Medication Dose Route Frequency Provider Last Rate Last Admin   0.9 %  sodium chloride  infusion  500 mL Intravenous Continuous Daina Drum, MD        VITAL SIGNS: There were no vitals taken for this visit. There were no vitals filed for this visit.   Estimated body mass index is 28.27 kg/m as calculated from the following:   Height as of an earlier encounter on 01/29/24: 5\' 4"  (1.626 m).   Weight as of an earlier encounter on 01/29/24: 164 lb 11.2 oz (74.7 kg).   PERFORMANCE STATUS (ECOG) : 1 - Symptomatic but completely ambulatory   Physical Exam General: NAD Cardiovascular: regular rate and rhythm Pulmonary: normal breathing pattern Abdomen: soft, nontender, + bowel sounds Extremities: no edema, no joint deformities Skin: no rashes Neurological:   Discussed the use of AI scribe software for clinical note transcription with the patient, who gave verbal consent to proceed. History of Present Illness Melody Rodriguez is a 48 year old female who presents to clinic for medication management and symptom control. No family present. Ambulatory with [facility]. She feels fatigued and 'dragging', which impacts her daily activities, including caring for her son. Appetite fluctuates. Some days are better than others. Weight is stable. Denies concerns of nausea, vomiting, constipation, or diarrhea.   She is currently taking Verzenio  and Femara . States over the past several weeks to months she has noticed and increase in hot flashes. They are occurring more frequently and all times of the day or night. Education provided on use of Effexor  to hopefully decrease hot flashes. I discussed at length efficacy, administration, and potential side effects. She verbalized understanding. Also discussed with Dr. Gudena prior to prescribing.   Melody Rodriguez states her pain is controlled most days. Feels neuropathic pain  has increased over the past 2-3 weeks. Her current regimen includes  use of a muscle relaxer daily, Dilaudid  every six hours as needed, reporting she will generally take one-two times a day depending on her activities and discomfort level. She is also on Lyrica  (pregabalin ) at a dose of 100 mg twice a day, which she takes in the morning and at night. Will increase to 150mg  twice daily.   All questions answered and support provided.    I discussed the importance of continued conversation with family and their medical providers regarding overall plan of care and treatment options, ensuring decisions are within the context of the patients values and GOCs. Assessment & Plan Cancer Related Pain Pain well-controlled on current regimen. Reports some increase in neuropathic pain.  -Continue hydromorphone  2 mg every 6 hours as needed. -Increase Lyrica  to 150 mg twice daily. -Continue baclofen  15 mg 3 times daily as needed.   Hormonal induced  hot flashes Patient reports increase in frequency of hot flashes occurring throughout the day and now waking her during the night at times.  -Education provided on use of Effexor  for symptom management.  -Discussed with Dr. Gudena for approval to start.  -Effexor  37.5 mg daily. -Will closely evaluate effectiveness.   I will plan to see her back in 6-8 weeks.  Sooner if needed.   Patient expressed understanding and was in agreement with this plan. She also understands that She can call the clinic at any time with any questions, concerns, or complaints.   Any controlled substances utilized were prescribed in the context of palliative care. PDMP has been reviewed.   Visit consisted of counseling and education dealing with the complex and emotionally intense issues of symptom management and palliative care in the setting of serious and potentially life-threatening illness.  Dellia Ferguson, AGPCNP-BC  Palliative Medicine Team/[facility]

## 2024-01-29 NOTE — Assessment & Plan Note (Signed)
 07/28/2020:Patient palpated a right breast mass for 1-2 years. Mammogram showed a 2.2cm mass at the 11 o'clock position with surrounding calcifications, 6.4cm in total extent, and up to 5 abnormal right axillary lymph nodes. 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   Treatment plan: 1. Neoadjuvant chemotherapy (MammaPrint test High Risk): AC foll by Taxol  completed 01/11/21 2. 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 Adjuvant radiation therapy 05/11/2021-07/04/2021 4. Follow-up adjuvant antiestrogen therapy along with abemaciclib  (patient has total of 4 lymph nodes positive), but she declined because of concern for toxicities [facility] 16070: Treatment of refractory nausea 5. ALND 03/28/21: 3/7 LN positive -------------------------------------------------------------------------------------------------------------------------------  Current treatment: Zoladex  plus letrozole  (patient did not want to start Verzenio  because of concern for toxicities).  Zoladex  discontinued status post bilateral salpingo-oophorectomy on 02/22/2023   Patient stopped taking letrozole  until the stomach cancer surgery happens. 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  (CTNNA1 gene mutation) Postop complications: Weight loss 131 pounds, nausea and vomiting   Bone Density: 08/31/21: T Score 1.1 (Normal) Mammograms: 11/13/2022: Benign, density Cat B CT CAP 09/07/2023: Increase in size of the liver lesion 3.9 cm (used to be 1.8 cm), possibly new lesion 0.6 cm  Biopsy at  [facility]: Consistent with metastatic breast cancer ER 92%, PR 98%, HER2 0   Treatment Plan: Verzenio  with Letrozole  (started 10/03/2023), dose reduced to 50 p.o. twice daily 11/22/2023    Toxicities: Neutropenia: With dose reduction neutrophil count has normalized. Patient has chronic diarrhea which is unchanged Hypokalemia: Will increase potassium containing foods.   CT CAP 01/22/2024: Liver metastases 2.5 x 2.1 cm (overall decreased in size) Continue with the current treatment rescan in 3 months

## 2024-02-01 ENCOUNTER — Other Ambulatory Visit: Payer: Self-pay

## 2024-02-02 ENCOUNTER — Other Ambulatory Visit (HOSPITAL_COMMUNITY): Payer: Self-pay

## 2024-02-04 ENCOUNTER — Other Ambulatory Visit: Payer: Self-pay

## 2024-02-04 ENCOUNTER — Other Ambulatory Visit: Payer: Self-pay | Admitting: Pharmacy Technician

## 2024-02-04 NOTE — Progress Notes (Signed)
 Specialty Pharmacy Refill Coordination Note  Melody Rodriguez is a 48 y.o. female contacted today regarding refills of specialty medication(s) Abemaciclib  (VERZENIO )   Patient requested Delivery Delivery date: 02/15/2024 Verified address: [address] [place] [address]   Medication will be filled on 02/14/24.  *Patient answered questionnaire*

## 2024-02-07 ENCOUNTER — Encounter: Payer: Self-pay | Admitting: Hematology and Oncology

## 2024-02-12 ENCOUNTER — Encounter: Payer: Self-pay | Admitting: Hematology and Oncology

## 2024-02-14 ENCOUNTER — Other Ambulatory Visit: Payer: Self-pay

## 2024-02-14 ENCOUNTER — Inpatient Hospital Stay: Admitting: Licensed Clinical Social Worker

## 2024-02-14 NOTE — Progress Notes (Signed)
 [facility] CSW Counseling Note  Patient was referred by self. Treatment type: Individual  Presenting Concerns: Patient and/or family reports the following symptoms/concerns: adjustment to stage IV cancer diagnosis; family dynamics Duration of problem: 3 months; Severity of problem: moderate   Orientation:oriented to person, place, time/date, and situation.   Affect: Appropriate and Congruent Risk of harm to self or others: No plan to harm self or others  Patient and/or Family's Strengths/Protective Factors: Ability for insight  Capable of independent living  Communication skills      Goals Addressed: Patient will:  Increase knowledge and/or ability of: coping skills  Increase healthy adjustment to current life circumstances and process new diagnosis   Progress towards Goals: Progressing   Interventions: Interventions utilized:  CBT and Strength-based      Assessment: Pt expressed increased issues with sleep due to pain and consequent tiredness and fatigue. Processed how it feels to have a perceived set back despite feeling overall improvement and discussed setting more strict boundaries about where she spends her energy while dealing with this sleep concern.  Discussed paced breathing for sleep as well as hot flashes. Pt continued to process family dynamics as well as frustrations with in home aide.     Plan: Follow up with CSW: joint with medical appointments in June Behavioral recommendations: Continue pacing activities, even when having energy bursts. Work on reframing, "and" statements, and looking for positives. Use paced breathing for hot flashes and sleep Referral(s): [facility]; [name]; [place]       Melody Scherger E [place], LCSW

## 2024-02-15 ENCOUNTER — Encounter: Payer: Self-pay | Admitting: Hematology and Oncology

## 2024-02-16 ENCOUNTER — Other Ambulatory Visit (HOSPITAL_COMMUNITY): Payer: Self-pay

## 2024-02-27 ENCOUNTER — Other Ambulatory Visit: Payer: Self-pay

## 2024-03-03 ENCOUNTER — Inpatient Hospital Stay (HOSPITAL_BASED_OUTPATIENT_CLINIC_OR_DEPARTMENT_OTHER): Admitting: Nurse Practitioner

## 2024-03-03 ENCOUNTER — Inpatient Hospital Stay: Attending: Hematology and Oncology

## 2024-03-03 ENCOUNTER — Inpatient Hospital Stay: Admitting: Licensed Clinical Social Worker

## 2024-03-03 ENCOUNTER — Inpatient Hospital Stay (HOSPITAL_BASED_OUTPATIENT_CLINIC_OR_DEPARTMENT_OTHER): Admitting: Hematology and Oncology

## 2024-03-03 ENCOUNTER — Encounter: Payer: Self-pay | Admitting: Nurse Practitioner

## 2024-03-03 ENCOUNTER — Inpatient Hospital Stay

## 2024-03-03 ENCOUNTER — Other Ambulatory Visit (HOSPITAL_COMMUNITY): Payer: Self-pay

## 2024-03-03 VITALS — BP 120/85 | HR 57 | Temp 98.4°F | Resp 18 | Wt 162.0 lb

## 2024-03-03 DIAGNOSIS — T451X5A Adverse effect of antineoplastic and immunosuppressive drugs, initial encounter: Secondary | ICD-10-CM | POA: Diagnosis not present

## 2024-03-03 DIAGNOSIS — Z79899 Other long term (current) drug therapy: Secondary | ICD-10-CM | POA: Insufficient documentation

## 2024-03-03 DIAGNOSIS — Z170 Estrogen receptor positive status [ER+]: Secondary | ICD-10-CM

## 2024-03-03 DIAGNOSIS — F32A Depression, unspecified: Secondary | ICD-10-CM | POA: Diagnosis not present

## 2024-03-03 DIAGNOSIS — Z515 Encounter for palliative care: Secondary | ICD-10-CM | POA: Diagnosis not present

## 2024-03-03 DIAGNOSIS — C50411 Malignant neoplasm of upper-outer quadrant of right female breast: Secondary | ICD-10-CM | POA: Diagnosis not present

## 2024-03-03 DIAGNOSIS — F17290 Nicotine dependence, other tobacco product, uncomplicated: Secondary | ICD-10-CM | POA: Insufficient documentation

## 2024-03-03 DIAGNOSIS — Z923 Personal history of irradiation: Secondary | ICD-10-CM | POA: Insufficient documentation

## 2024-03-03 DIAGNOSIS — G893 Neoplasm related pain (acute) (chronic): Secondary | ICD-10-CM | POA: Diagnosis not present

## 2024-03-03 DIAGNOSIS — Z79811 Long term (current) use of aromatase inhibitors: Secondary | ICD-10-CM | POA: Diagnosis not present

## 2024-03-03 DIAGNOSIS — C773 Secondary and unspecified malignant neoplasm of axilla and upper limb lymph nodes: Secondary | ICD-10-CM | POA: Insufficient documentation

## 2024-03-03 DIAGNOSIS — R232 Flushing: Secondary | ICD-10-CM

## 2024-03-03 LAB — CMP (CANCER CENTER ONLY)
ALT: 14 U/L (ref 0–44)
AST: 15 U/L (ref 15–41)
Albumin: 4.3 g/dL (ref 3.5–5.0)
Alkaline Phosphatase: 77 U/L (ref 38–126)
Anion gap: 7 (ref 5–15)
BUN: 10 mg/dL (ref 6–20)
CO2: 27 mmol/L (ref 22–32)
Calcium: 9 mg/dL (ref 8.9–10.3)
Chloride: 110 mmol/L (ref 98–111)
Creatinine: 0.76 mg/dL (ref 0.44–1.00)
GFR, Estimated: >60 mL/min (ref >60)
Glucose, Bld: 115 mg/dL — ABNORMAL HIGH (ref 70–99)
Potassium: 3.5 mmol/L (ref 3.5–5.1)
Sodium: 144 mmol/L (ref 135–145)
Total Bilirubin: 0.4 mg/dL (ref 0.0–1.2)
Total Protein: 7.2 g/dL (ref 6.5–8.1)

## 2024-03-03 LAB — CBC WITH DIFFERENTIAL (CANCER CENTER ONLY)
Abs Immature Granulocytes: 0.01 10*3/uL (ref 0.00–0.07)
Basophils Absolute: 0 10*3/uL (ref 0.0–0.1)
Basophils Relative: 1 %
Eosinophils Absolute: 0.1 10*3/uL (ref 0.0–0.5)
Eosinophils Relative: 3 %
HCT: 37.2 % (ref 36.0–46.0)
Hemoglobin: 13 g/dL (ref 12.0–15.0)
Immature Granulocytes: 0 %
Lymphocytes Relative: 34 %
Lymphs Abs: 1.4 10*3/uL (ref 0.7–4.0)
MCH: 32.1 pg (ref 26.0–34.0)
MCHC: 34.9 g/dL (ref 30.0–36.0)
MCV: 91.9 fL (ref 80.0–100.0)
Monocytes Absolute: 0.2 10*3/uL (ref 0.1–1.0)
Monocytes Relative: 5 %
Neutro Abs: 2.4 10*3/uL (ref 1.7–7.7)
Neutrophils Relative %: 57 %
Platelet Count: 148 10*3/uL — ABNORMAL LOW (ref 150–400)
RBC: 4.05 MIL/uL (ref 3.87–5.11)
RDW: 12.4 % (ref 11.5–15.5)
WBC Count: 4.2 10*3/uL (ref 4.0–10.5)
nRBC: 0 % (ref 0.0–0.2)

## 2024-03-03 MED ORDER — GLIPIZIDE ER 2.5 MG PO TB24
2.5000 mg | ORAL_TABLET | Freq: Every day | ORAL | 1 refills | Status: DC
  Filled 2024-03-03: qty 90, 90d supply, fill #0
  Filled 2024-03-26 (×3): qty 30, 30d supply, fill #0
  Filled 2024-04-15 – 2024-04-24 (×3): qty 30, 30d supply, fill #1
  Filled 2024-05-21 – 2024-05-27 (×2): qty 30, 30d supply, fill #2
  Filled 2024-06-18 – 2024-06-24 (×2): qty 30, 30d supply, fill #3
  Filled 2024-07-28: qty 30, 30d supply, fill #4
  Filled 2024-08-21: qty 30, 30d supply, fill #5

## 2024-03-03 MED ORDER — VENLAFAXINE HCL ER 75 MG PO CP24
75.0000 mg | ORAL_CAPSULE | Freq: Every day | ORAL | 3 refills | Status: DC
  Filled 2024-03-03: qty 90, 90d supply, fill #0
  Filled 2024-03-26 – 2024-04-15 (×2): qty 90, 90d supply, fill #1
  Filled 2024-04-23 – 2024-05-27 (×3): qty 30, 30d supply, fill #1
  Filled 2024-06-24 – 2024-09-23 (×2): qty 30, 30d supply, fill #2

## 2024-03-03 MED ORDER — HYDROMORPHONE HCL 2 MG PO TABS
2.0000 mg | ORAL_TABLET | Freq: Four times a day (QID) | ORAL | 0 refills | Status: DC | PRN
  Filled 2024-03-03: qty 60, 15d supply, fill #0

## 2024-03-03 MED ORDER — HEPARIN SOD (PORK) LOCK FLUSH 100 UNIT/ML IV SOLN
500.0000 [IU] | Freq: Once | INTRAVENOUS | Status: AC
  Administered 2024-03-03: 500 [IU]

## 2024-03-03 MED ORDER — SODIUM CHLORIDE 0.9% FLUSH
10.0000 mL | Freq: Once | INTRAVENOUS | Status: AC
  Administered 2024-03-03: 10 mL

## 2024-03-03 NOTE — Assessment & Plan Note (Signed)
 09/28/2019:Patient palpated a right breast mass for 1-2 years. Mammogram showed a 2.2cm mass at the 11 o'clock position with surrounding calcifications, 6.4cm in total extent, and up to 5 abnormal right axillary lymph nodes. 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   Treatment plan: 1. Neoadjuvant chemotherapy (MammaPrint test High Risk): AC foll by Taxol  completed 01/11/21 2. 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 Adjuvant radiation therapy 05/11/2021-07/04/2021 4. Follow-up adjuvant antiestrogen therapy along with abemaciclib  (patient has total of 4 lymph nodes positive), but she declined because of concern for toxicities [facility] 16070: Treatment of refractory nausea 5. ALND 03/28/21: 3/7 LN positive -------------------------------------------------------------------------------------------------------------------------------  Current treatment: Zoladex  plus letrozole  (patient did not want to start Verzenio  because of concern for toxicities).  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  (CTNNA1 gene mutation) Postop complications: Weight loss 131 pounds, nausea and vomiting   Bone Density: 08/31/21: T Score 1.1 (Normal) Mammograms: 11/13/2022: Benign, density Cat B CT CAP 09/07/2023: Increase in size of the liver lesion 3.9 cm (used to be 1.8 cm), possibly new lesion 0.6 cm  Biopsy at [facility]: Consistent with metastatic breast cancer ER 92%, PR 98%, HER2 0    Treatment Plan: Verzenio  with Letrozole  (started 10/03/2023), dose reduced to 50 p.o. twice daily 11/22/2023    Toxicities: Neutropenia: With dose reduction neutrophil count has normalized. Patient has chronic diarrhea which is unchanged Hypokalemia: Will increase potassium containing foods.  She is drinking coconut water  for potassium   CT CAP 01/22/2024: Liver metastases 2.5 x 2.1 cm (overall decreased in size) Continue with the current treatment rescan in 3 months Return to clinic monthly for lab and follow-ups

## 2024-03-03 NOTE — Progress Notes (Addendum)
 [facility] CSW Counseling Note  Patient was referred by self. Treatment type: Individual  Presenting Concerns: Patient and/or family reports the following symptoms/concerns: adjustment to stage IV cancer diagnosis; family dynamics Duration of problem: 6 months; Severity of problem: moderate   Orientation:oriented to person, place, time/date, and situation.   Affect: Appropriate and Congruent Risk of harm to self or others: No plan to harm self or others  Patient and/or Family's Strengths/Protective Factors: Ability for insight  Capable of independent living  Communication skills      Goals Addressed: Patient will:  Increase knowledge and/or ability of: coping skills  Increase healthy adjustment to current life circumstances and process new diagnosis   Progress towards Goals: Progressing   Interventions: Interventions utilized:  CBT and Strength-based      Assessment: Pt voiced increased fatigue both mentally and physically since last visit. She is thinking more about her quality of life, particularly around eating and resulting physical discomfort. Discussion today on what quality looks like to patient. Pt has continued frustration with her aide and is debating whether it relieves or increases stress levels.  Pt is interested in beginning to find alternative coping strategies. CSW briefly showed mindfulness cards and will delve into more detail in next visit.   Pt provided document that needs MD completion for her car loan. CSW gave document to RN today.   Plan: Follow up with CSW: 04/01/24 joint with medical appointments Behavioral recommendations: Continue pacing activities, even when having energy bursts. Notice when stress is outweighing benefit and whether the trade-off is worth continuing the service or activity for yourself and your quality of life Referral(s): [facility]; [address] [facility]; [place]       Connie Lasater E [place], LCSW

## 2024-03-03 NOTE — Progress Notes (Signed)
 Patient Care Team: Jackson, Kerra J, PA-C as PCP - General (Physician Assistant) Oza Blumenthal, MD as Consulting Physician (General Surgery) Cameron Cea, MD as Consulting Physician (Hematology and Oncology) Retta Caster, MD as Consulting Physician (Radiation Oncology) Pickenpack-Cousar, Giles Labrum, NP as Nurse Practitioner [facility] and Palliative Medicine)  DIAGNOSIS:  Encounter Diagnosis  Name Primary?   Malignant neoplasm of upper-outer quadrant of right breast in female, estrogen receptor positive (HCC) Yes    SUMMARY OF ONCOLOGIC HISTORY: Oncology History  Malignant neoplasm of upper-outer quadrant of right breast in female, estrogen receptor positive (HCC)  07/28/2020 Initial Diagnosis   Patient palpated a right breast mass for 1-2 years. Mammogram showed a 2.2cm mass at the 11 o'clock position with surrounding calcifications, 6.4cm in total extent, and up to 5 abnormal right axillary lymph nodes. 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    08/05/2020 Miscellaneous   MammaPrint: High risk luminal type B   08/11/2020 Genetic Testing   Negative genetic testing: no pathogenic variants detected in [facility] Breast Cancer STAT Panel or Common Hereditary Cancers panel. The report dates are August 11, 2020 and August 19, 2020, respectively. Two variants of uncertain signficance were detected - one in the CTNNA1 gene called c.86del and the second in the MLH1 gene called c.808A>G.   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 The change in variant classification was made as a result of re-review of the evidence in light of new variant interpretation guidelines and/or new information.   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 The amended report date is February 16, 2022.   The STAT  Breast cancer panel offered by [facility] includes sequencing and rearrangement analysis for the following 9 genes:  ATM, BRCA1, BRCA2, CDH1, CHEK2, PALB2, PTEN, STK11 and TP53.  The Common Hereditary Cancers Panel offered by [facility] includes sequencing and/or deletion duplication testing of the following 48 genes: APC, ATM, AXIN2, BARD1, BMPR1A, BRCA1, BRCA2, BRIP1, CDH1, CDK4, CDKN2A (p14ARF), CDKN2A (p16INK4a), CHEK2, CTNNA1, DICER1, EPCAM (Deletion/duplication testing only), GREM1 (promoter region deletion/duplication testing only), KIT, MEN1, MLH1, MSH2, MSH3, MSH6, MUTYH, NBN, NF1, NTHL1, PALB2, PDGFRA, PMS2, POLD1, POLE, PTEN, RAD50, RAD51C, RAD51D, RNF43, SDHB, SDHC, SDHD, SMAD4, SMARCA4. STK11, TP53, TSC1, TSC2, and VHL.  The following genes were evaluated for sequence changes only: SDHA and HOXB13 c.251G>A variant only.    09/01/2020 - 01/11/2021 Neo-Adjuvant Chemotherapy   Adriamycin  and Cytoxan  x4 09/01/2020-10/12/2020 Weekly Taxol  x 12  10/26/2020-01/11/2021(dose reduced d/t AE)   02/07/2021 Surgery   Right lumpectomy Lucienne Ryder): invasive and in situ ductal carcinoma, 2.8cm, clear margins, with metastatic carcinoma in 1/1 right axillary lymph nodes.   03/28/2021 Surgery   Axillary lymph node dissection: 3/7 lymph nodes +   05/10/2021 - 07/01/2021 Radiation Therapy   Site Technique Total Dose (Gy) Dose per Fx (Gy) Completed Fx Beam Energies  Breast, Right: Breast_Rt 3D 50.4/50.4 1.8 28/28 10X  Breast, Right: Breast_Rt_Bst specialPort 12/12 2 6/6 15E  Sclav-RT: SCV_Rt 3D 50.4/50.4 1.8 28/28      07/2021 -  Anti-estrogen oral therapy   Zoladex  + Letrozole  + Verzenio      CHIEF COMPLIANT: Follow-up of metastatic breast cancer  HISTORY OF PRESENT ILLNESS:   History of Present Illness Melody Rodriguez is a 48 year old female who presents with persistent diarrhea and  coordination problems.  She experiences persistent diarrhea once or twice daily, often triggered by eating, and lasting all day.  She attempts to manage her potassium levels with potassium-rich foods like bananas. A previous surgical operation has impacted her gastrointestinal function, contributing to ongoing diarrhea and potassium management issues. She is awaiting results for electrolytes, kidney, and liver function tests.  She has difficulty with coordination, leading to multiple falls, including one where she passed out due to a hot flash. Coordination is particularly affected when exiting her high-sided bathtub, resulting in falls and bruises. She attributes some coordination issues to her medication, which she believes affects her bone density.  She experiences hot flashes that can lead to episodes of passing out, feeling extremely hot, and finding relief on a cold floor. She takes Effexor  for depression at a dose of 37 mg at night .     ALLERGIES:  is allergic to duloxetine , keflex [cephalexin], tramadol , latex, and naproxen.  MEDICATIONS:  Current Outpatient Medications  Medication Sig Dispense Refill   abemaciclib  (VERZENIO ) 50 MG tablet Take 1 tablet (50 mg total) by mouth 2 (two) times daily. Swallow tablets whole. Do not chew, crush, or split tablets before swallowing. 56 tablet 5   ACCU-CHEK GUIDE test strip USE TO MONITOR BLOOD GLUCOSE 3 TIME(S) DAILY     amoxicillin (AMOXIL) 500 MG capsule Take 500 mg by mouth every 6 (six) hours.     Baclofen  5 MG TABS Take 3 tablets (15 mg total) by mouth 3 (three) times daily. 180 tablet 2   CREON 24000-76000 units CPEP Take by mouth. Take 2 capsules by mouth 3 times daily with meals. Take 2 capsules with meals and 1 with snacks     glipiZIDE  (GLUCOTROL  XL) 2.5 MG 24 hr tablet Take 1 tablet (2.5 mg total) by mouth daily. 30 tablet 1   glipiZIDE  (GLUCOTROL  XL) 2.5 MG 24 hr tablet Take 1 tablet (2.5 mg total) by mouth daily. 30 tablet 2   Glucose Blood (BLOOD GLUCOSE TEST STRIPS) STRP Use to check blood sugar as directed 100 each 5   hydrocortisone  (ANUSOL -HC) 2.5 % rectal  cream Apply rectally 2 times daily 30 g 1   HYDROmorphone  (DILAUDID ) 2 MG tablet Take 1 tablet (2 mg total) by mouth every 6 (six) hours as needed for severe pain (pain score 7-10). 60 tablet 0   letrozole  (FEMARA ) 2.5 MG tablet Take 1 tablet (2.5 mg total) by mouth daily. 90 tablet 3   ondansetron  (ZOFRAN -ODT) 8 MG disintegrating tablet Take 1 tablet (8 mg total) by mouth every 8 (eight) hours as needed for nausea or vomiting. 60 tablet 4   pantoprazole  (PROTONIX ) 40 MG tablet Take 1 tablet (40 mg total) by mouth daily. 30 tablet 11   potassium chloride  SA (KLOR-CON  M) 20 MEQ tablet Take 1 tablet (20 mEq total) by mouth 2 (two) times daily for 7 days. 14 tablet 0   pregabalin  (LYRICA ) 150 MG capsule Take 1 capsule (150 mg total) by mouth 2 (two) times daily. 60 capsule 3   sucralfate  (CARAFATE ) 1 GM/10ML suspension Take 10 mLs (1 g total) by mouth 4 (four) times daily before meals and nightly 1200 mL 11   triamcinolone  cream (KENALOG ) 0.1 % Apply 1 Application topically 2 (two) times daily as needed. 40 g 1   venlafaxine  XR (EFFEXOR  XR) 37.5 MG 24 hr capsule Take 1 capsule (37.5 mg total) by mouth daily with breakfast. 30 capsule 2   Current Facility-Administered Medications  Medication Dose Route  Frequency Provider Last Rate Last Admin   0.9 %  sodium chloride  infusion  500 mL Intravenous Continuous Daina Drum, MD        PHYSICAL EXAMINATION: ECOG PERFORMANCE STATUS: 1 - Symptomatic but completely ambulatory  Vitals:   03/03/24 0901  BP: 120/85  Pulse: (!) 57  Resp: 18  Temp: 98.4 F (36.9 C)  SpO2: 100%   Filed Weights   03/03/24 0901  Weight: 162 lb (73.5 kg)    Physical Exam Diffuse pains, recent falls     (exam performed in the presence of a chaperone)  LABORATORY DATA:  I have reviewed the data as listed    Latest Ref Rng & Units 01/22/2024    9:28 AM 01/01/2024    2:05 PM 12/03/2023    7:56 AM  CMP  Glucose 70 - 99 mg/dL 84  161  88   BUN 6 - 20 mg/dL 11  18   9    Creatinine 0.44 - 1.00 mg/dL 0.96  0.45  4.09   Sodium 135 - 145 mmol/L 142  143  144   Potassium 3.5 - 5.1 mmol/L 3.9  3.6  3.8   Chloride 98 - 111 mmol/L 109  111  110   CO2 22 - 32 mmol/L 29  28  26    Calcium  8.9 - 10.3 mg/dL 9.2  8.9  9.3   Total Protein 6.5 - 8.1 g/dL 7.2  7.0  8.1   Total Bilirubin 0.0 - 1.2 mg/dL 0.5  0.4  0.4   Alkaline Phos 38 - 126 U/L 74  69  88   AST 15 - 41 U/L 15  11  14    ALT 0 - 44 U/L 13  13  13      Lab Results  Component Value Date   WBC 4.2 03/03/2024   HGB 13.0 03/03/2024   HCT 37.2 03/03/2024   MCV 91.9 03/03/2024   PLT 148 (L) 03/03/2024   NEUTROABS 2.4 03/03/2024    ASSESSMENT & PLAN:  Malignant neoplasm of upper-outer quadrant of right breast in female, estrogen receptor positive (HCC) 09/28/2019:Patient palpated a right breast mass for 1-2 years. Mammogram showed a 2.2cm mass at the 11 o'clock position with surrounding calcifications, 6.4cm in total extent, and up to 5 abnormal right axillary lymph nodes. 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   Treatment plan: 1. Neoadjuvant chemotherapy (MammaPrint test High Risk): AC foll by Taxol  completed 01/11/21 2. 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 Adjuvant radiation therapy 05/11/2021-07/04/2021 4. Follow-up adjuvant antiestrogen therapy along with abemaciclib  (patient has total of 4 lymph nodes positive), but she declined because of concern for toxicities [facility] 16070: Treatment of refractory nausea 5. ALND 03/28/21: 3/7 LN positive -------------------------------------------------------------------------------------------------------------------------------  Current  treatment: Zoladex  plus letrozole  (patient did not want to start Verzenio  because of concern for toxicities).  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  (CTNNA1 gene mutation) Postop complications: Weight loss 131 pounds, nausea and vomiting   Bone Density:  08/31/21: T Score 1.1 (Normal) Mammograms: 11/13/2022: Benign, density Cat B CT CAP 09/07/2023: Increase in size of the liver lesion 3.9 cm (used to be 1.8 cm), possibly new lesion 0.6 cm  Biopsy at [facility]: Consistent with metastatic breast cancer ER 92%, PR 98%, HER2 0   Treatment Plan: Verzenio  with Letrozole  (started 10/03/2023), dose reduced to 50 p.o. twice daily 11/22/2023    Toxicities: Neutropenia: With dose reduction neutrophil count has normalized. Patient has chronic diarrhea which is unchanged Hypokalemia: Will increase potassium containing foods.  She is drinking coconut water  for potassium   CT CAP 01/22/2024: Liver metastases 2.5 x 2.1 cm (overall decreased in size) Continue with the current treatment rescan in aug 2025 Return to clinic in 2 months with scans and follow-up with labs Assessment & Plan Joint pain due to medication Joint pain likely medication-induced, possibly related to bone density issues.  Diarrhea Chronic diarrhea possibly related to surgery and medication, affecting potassium levels. - Review electrolyte, kidney, and liver function test results.  Hot flashes Severe hot flashes causing syncope and falls, associated with hypotension.  Falls Falls likely related to hot flashes and coordination issues.  Coordination issues Coordination issues possibly medication-related, contributing to falls.  Depression Depression present, less severe than before, affecting quality of  life. - Increase Effexor  to 75 mg. - Monitor mood and quality of life.      No orders of the defined types were placed in this encounter.  The patient has a good understanding of the overall plan. she agrees with it. she will call with any problems that may develop before the next visit here. Total time spent: 30 mins including face to face time and time spent for planning, charting and co-ordination of care   Margert Sheerer, MD 03/03/24

## 2024-03-03 NOTE — Progress Notes (Signed)
 Palliative Medicine [facility]  [phone] [phone]   Name: Melody Rodriguez Date: 03/03/2024 MRN: [id number]  DOB: [birth year]  Patient Care Team: Sharalyn Dasen as PCP - General (Physician Assistant) Oza Blumenthal, MD as Consulting Physician (General Surgery) Cameron Cea, MD as Consulting Physician (Hematology and Oncology) Retta Caster, MD as Consulting Physician (Radiation Oncology) Pickenpack-Cousar, Giles Labrum, NP as Nurse Practitioner (Hospice and Palliative Medicine)    INTERVAL HISTORY: Melody Rodriguez is a 48 y.o. female with oncologic medical history including estrogen receptor positive metastatic breast cancer (07/2020) currently on Abemaciclib .  Palliative ask to see for symptom management and goals of care.  SOCIAL HISTORY:     reports that she has quit smoking. Her smoking use included cigars and cigarettes. She has a 22 pack-year smoking history. She has never used smokeless tobacco. She reports that she does not currently use alcohol. She reports that she does not currently use drugs after having used the following drugs: Marijuana.  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  MEDICATIONS:  Current Outpatient Medications  Medication Sig Dispense Refill   abemaciclib  (VERZENIO ) 50 MG tablet Take 1 tablet (50 mg total) by mouth 2 (two) times daily. Swallow tablets whole. Do not chew, crush, or split tablets before swallowing. 56 tablet 5   ACCU-CHEK GUIDE test strip USE TO MONITOR BLOOD GLUCOSE 3 TIME(S) DAILY     Baclofen  5 MG TABS Take 3 tablets (15 mg total) by mouth 3 (three) times daily. 180 tablet 2   CREON 24000-76000 units CPEP Take by mouth. Take 2 capsules by mouth 3 times daily with meals. Take 2 capsules with meals and 1 with snacks     glipiZIDE  (GLUCOTROL  XL) 2.5 MG 24 hr tablet Take 1 tablet (2.5 mg total) by mouth daily. 30 tablet 1   glipiZIDE  (GLUCOTROL  XL) 2.5 MG 24 hr tablet Take 1 tablet (2.5 mg total) by mouth daily. 30 tablet 2   glipiZIDE  (GLUCOTROL  XL) 2.5 MG 24 hr tablet Take 1 tablet (2.5 mg total) by mouth daily. 90 tablet 1   Glucose Blood (BLOOD GLUCOSE TEST STRIPS) STRP Use to check blood sugar as directed 100 each 5   hydrocortisone  (ANUSOL -HC) 2.5 % rectal cream Apply rectally 2 times daily 30 g 1   HYDROmorphone  (DILAUDID ) 2 MG tablet Take 1 tablet (2 mg total) by mouth every 6 (six) hours as needed for severe pain (pain score 7-10). 60 tablet 0   letrozole  (FEMARA ) 2.5 MG tablet Take 1 tablet (2.5 mg total) by mouth daily. 90  tablet 3   ondansetron  (ZOFRAN -ODT) 8 MG disintegrating tablet Take 1 tablet (8 mg total) by mouth every 8 (eight) hours as needed for nausea or vomiting. 60 tablet 4   pantoprazole  (PROTONIX ) 40 MG tablet Take 1 tablet (40 mg total) by mouth daily. 30 tablet 11   potassium chloride  SA (KLOR-CON  M) 20 MEQ tablet Take 1 tablet (20 mEq total) by mouth 2 (two) times daily for 7 days. 14 tablet 0   pregabalin  (LYRICA ) 150 MG capsule Take 1 capsule (150 mg total) by mouth 2 (two) times daily. 60 capsule 3   sucralfate  (CARAFATE ) 1 GM/10ML suspension Take 10 mLs (1 g total) by mouth 4 (four) times  daily before meals and nightly 1200 mL 11   triamcinolone  cream (KENALOG ) 0.1 % Apply 1 Application topically 2 (two) times daily as needed. 40 g 1   venlafaxine  XR (EFFEXOR  XR) 75 MG 24 hr capsule Take 1 capsule (75 mg total) by mouth daily with breakfast. 90 capsule 3   Current Facility-Administered Medications  Medication Dose Route Frequency Provider Last Rate Last Admin   0.9 %  sodium chloride  infusion  500 mL Intravenous Continuous Daina Drum, MD        VITAL SIGNS: There were no vitals taken for this visit. There were no vitals filed for this visit.   Estimated body mass index is 27.81 kg/m as calculated from the following:   Height as of 01/29/24: 5' 4 (1.626 m).   Weight as of an earlier encounter on 03/03/24: 162 lb (73.5 kg).   PERFORMANCE STATUS (ECOG) : 1 - Symptomatic but completely ambulatory   Physical Exam General: NAD Cardiovascular: regular rate and rhythm Pulmonary: normal breathing pattern Abdomen: soft, nontender, + bowel sounds Extremities: no edema, no joint deformities Skin: no rashes Neurological:   Discussed the use of AI scribe software for clinical note transcription with the patient, who gave verbal consent to proceed. History of Present Illness Melody Rodriguez is a 48 year old female who presents to clinic for medication management and symptom control. No family present. Ambulatory with Rolator. No acute distress. Denies concerns of nausea, vomiting, or constipation.   She has experienced three recent falls. The first occurred when she overheated and lost consciousness, the second while getting out of the shower, and the third while playing with her son, where she tripped over a steep place on the floor. These incidents resulted in a knot on the back of her neck and jaw pain. Reports getting overheated from hot flashes causes most of her distress. This has been discussed with her oncologist.   Melody Rodriguez is emotional expressing ongoing signs of  depression which includes lack of motivation, tearfulness, feeling down at times. She is currently taking Effexor , which was recently increased to 75 mg during today's visit with Dr. Gudena.   Patient reports her pain is controlled on current regimen which incluses Dilaudid , for which she has requested a refill. She also uses baclofen  as a muscle relaxer which she does not require daily however due to recent falls she has taken on several occasions over the past few weeks. Neuropathic symptoms controlled with pregablin.   She experiences abdominal discomfort and diarrhea, which she notes affects her potassium levels. She tries to manage this by consuming foods high in potassium, such as bananas, and drinks yogurt smoothies that contain potassium in addition to high grams in protein.   She is on an antibiotic prescribed by her dentist for a cyst and  mentions forgetting to use a prescribed rinse. She has a follow-up dental appointment scheduled for next week.   I discussed the importance of continued conversation with family and their medical providers regarding overall plan of care and treatment options, ensuring decisions are within the context of the patients values and GOCs. Assessment & Plan Cancer Related Pain Pain well-controlled on current regimen. Reports some increase in neuropathic pain.  -Continue hydromorphone  2 mg every 6 hours as needed. -Continue Lyrica  150 mg twice daily. -Continue baclofen  15 mg 3 times daily as needed.   Hormonal induced hot flashes Patient reports increase in frequency of hot flashes occurring throughout the day and now waking her during the night at times.  -Effexor  75 mg daily. -Will closely evaluate effectiveness.   Syncope and collapse Recurrent syncope and falls possibly due to overheating, sudden movements, medications, or positional changes.   Diarrhea Persistent diarrhea affecting potassium levels. Managed with dietary adjustments including  potassium-rich foods. - Continue dietary management with potassium-rich foods. -Increase fluid intake. Imodium as needed.   Depression Patient reporting worsening feelings of depression. No SI thoughts.  -Effexor  increased to 75mg  per Dr. Lee Public -Continue close follow-up wit LCSW for ongoing support.   I will plan to see her back in 6-8 weeks.  Sooner if needed.   Patient expressed understanding and was in agreement with this plan. She also understands that She can call the clinic at any time with any questions, concerns, or complaints.   Any controlled substances utilized were prescribed in the context of palliative care. PDMP has been reviewed.   Visit consisted of counseling and education dealing with the complex and emotionally intense issues of symptom management and palliative care in the setting of serious and potentially life-threatening illness.  Dellia Ferguson, AGPCNP-BC  Palliative Medicine Team/ Cancer Center

## 2024-03-04 LAB — CANCER ANTIGEN 27.29: CA 27.29: 60.4 U/mL — ABNORMAL HIGH (ref 0.0–38.6)

## 2024-03-05 ENCOUNTER — Other Ambulatory Visit (HOSPITAL_COMMUNITY): Payer: Self-pay

## 2024-03-05 ENCOUNTER — Encounter: Payer: Self-pay | Admitting: Hematology and Oncology

## 2024-03-05 MED ORDER — TRIAMCINOLONE ACETONIDE 0.1 % EX CREA
1.0000 | TOPICAL_CREAM | Freq: Two times a day (BID) | CUTANEOUS | 0 refills | Status: AC
  Filled 2024-03-05: qty 30, 15d supply, fill #0

## 2024-03-05 MED ORDER — CETIRIZINE HCL 10 MG PO TABS
10.0000 mg | ORAL_TABLET | Freq: Every day | ORAL | 0 refills | Status: DC
  Filled 2024-03-05: qty 10, 10d supply, fill #0

## 2024-03-07 ENCOUNTER — Encounter (INDEPENDENT_AMBULATORY_CARE_PROVIDER_SITE_OTHER): Payer: Self-pay

## 2024-03-07 ENCOUNTER — Other Ambulatory Visit (HOSPITAL_COMMUNITY): Payer: Self-pay

## 2024-03-07 ENCOUNTER — Other Ambulatory Visit: Payer: Self-pay

## 2024-03-07 NOTE — Progress Notes (Signed)
 Specialty Pharmacy Refill Coordination Note  MyChart Questionnaire Submission  Melody Rodriguez is a 48 y.o. female contacted today regarding refills of specialty medication(s) Verzenio .  Patient requested: (Patient-Rptd) Delivery   Delivery date: 03/11/24   Verified address: (Patient-Rptd) [address] [place] [address]  Medication will be filled on 03/10/24.

## 2024-03-10 ENCOUNTER — Other Ambulatory Visit (HOSPITAL_COMMUNITY): Payer: Self-pay

## 2024-03-10 ENCOUNTER — Other Ambulatory Visit: Payer: Self-pay

## 2024-03-18 ENCOUNTER — Other Ambulatory Visit: Payer: Self-pay

## 2024-03-20 ENCOUNTER — Other Ambulatory Visit: Payer: Self-pay

## 2024-03-20 NOTE — Progress Notes (Signed)
 Specialty Pharmacy Ongoing Clinical Assessment Note  Melody Rodriguez is a 48 y.o. female who is being followed by the specialty pharmacy service for RxSp Oncology   Patient's specialty medication(s) reviewed today: Abemaciclib  (VERZENIO )   Missed doses in the last 4 weeks: 0   Patient/Caregiver asked additional questions regarding keeping up with medication refills for non-specialty medications. See Clinical Intervention notes.  Therapeutic benefit summary: Patient is achieving benefit   Adverse events/side effects summary: Experienced adverse events/side effects (diarrhea and nausea, has meds on hand to help but has to use regularly)   Patient's therapy is appropriate to: Continue    Goals Addressed             This Visit's Progress    Maintain optimal adherence to therapy   On track    Patient is on track. Patient will maintain adherence         Follow up: 3 months  [facility] Specialty Pharmacist

## 2024-03-23 ENCOUNTER — Encounter: Payer: Self-pay | Admitting: Hematology and Oncology

## 2024-03-25 ENCOUNTER — Other Ambulatory Visit: Payer: Self-pay

## 2024-03-26 ENCOUNTER — Other Ambulatory Visit (HOSPITAL_COMMUNITY): Payer: Self-pay

## 2024-03-26 ENCOUNTER — Other Ambulatory Visit: Payer: Self-pay

## 2024-03-26 ENCOUNTER — Other Ambulatory Visit: Payer: Self-pay | Admitting: Nurse Practitioner

## 2024-03-26 ENCOUNTER — Encounter: Payer: Self-pay | Admitting: Hematology and Oncology

## 2024-03-26 DIAGNOSIS — G893 Neoplasm related pain (acute) (chronic): Secondary | ICD-10-CM

## 2024-03-26 DIAGNOSIS — Z170 Estrogen receptor positive status [ER+]: Secondary | ICD-10-CM

## 2024-03-26 DIAGNOSIS — Z515 Encounter for palliative care: Secondary | ICD-10-CM

## 2024-03-26 MED ORDER — CETIRIZINE HCL 10 MG PO TABS
10.0000 mg | ORAL_TABLET | Freq: Every day | ORAL | 0 refills | Status: AC
  Filled 2024-03-26: qty 10, 10d supply, fill #0

## 2024-03-26 MED ORDER — BACLOFEN 5 MG PO TABS
15.0000 mg | ORAL_TABLET | Freq: Three times a day (TID) | ORAL | 2 refills | Status: DC
  Filled 2024-04-08: qty 180, 20d supply, fill #0
  Filled 2024-04-15 – 2024-04-23 (×2): qty 180, 20d supply, fill #1
  Filled 2024-04-24: qty 270, 30d supply, fill #1
  Filled ????-??-??: fill #0

## 2024-03-27 ENCOUNTER — Other Ambulatory Visit: Payer: Self-pay

## 2024-04-01 ENCOUNTER — Inpatient Hospital Stay (HOSPITAL_BASED_OUTPATIENT_CLINIC_OR_DEPARTMENT_OTHER): Admitting: Nurse Practitioner

## 2024-04-01 ENCOUNTER — Inpatient Hospital Stay: Admitting: Licensed Clinical Social Worker

## 2024-04-01 ENCOUNTER — Other Ambulatory Visit

## 2024-04-01 ENCOUNTER — Inpatient Hospital Stay

## 2024-04-01 ENCOUNTER — Encounter: Payer: Self-pay | Admitting: Nurse Practitioner

## 2024-04-01 ENCOUNTER — Inpatient Hospital Stay: Payer: Medicaid Other | Attending: Hematology and Oncology | Admitting: Hematology and Oncology

## 2024-04-01 ENCOUNTER — Other Ambulatory Visit (HOSPITAL_COMMUNITY): Payer: Self-pay

## 2024-04-01 VITALS — BP 124/82 | HR 72 | Temp 98.3°F | Resp 19 | Ht 64.0 in | Wt 166.8 lb

## 2024-04-01 DIAGNOSIS — G893 Neoplasm related pain (acute) (chronic): Secondary | ICD-10-CM | POA: Diagnosis not present

## 2024-04-01 DIAGNOSIS — C50411 Malignant neoplasm of upper-outer quadrant of right female breast: Secondary | ICD-10-CM | POA: Insufficient documentation

## 2024-04-01 DIAGNOSIS — Z515 Encounter for palliative care: Secondary | ICD-10-CM | POA: Diagnosis not present

## 2024-04-01 DIAGNOSIS — C773 Secondary and unspecified malignant neoplasm of axilla and upper limb lymph nodes: Secondary | ICD-10-CM | POA: Diagnosis present

## 2024-04-01 DIAGNOSIS — Z170 Estrogen receptor positive status [ER+]: Secondary | ICD-10-CM | POA: Diagnosis not present

## 2024-04-01 DIAGNOSIS — Z79811 Long term (current) use of aromatase inhibitors: Secondary | ICD-10-CM | POA: Diagnosis not present

## 2024-04-01 DIAGNOSIS — R110 Nausea: Secondary | ICD-10-CM | POA: Diagnosis not present

## 2024-04-01 DIAGNOSIS — Z87891 Personal history of nicotine dependence: Secondary | ICD-10-CM | POA: Diagnosis not present

## 2024-04-01 LAB — CMP (CANCER CENTER ONLY)
ALT: 11 U/L (ref 0–44)
AST: 14 U/L — ABNORMAL LOW (ref 15–41)
Albumin: 3.8 g/dL (ref 3.5–5.0)
Alkaline Phosphatase: 75 U/L (ref 38–126)
Anion gap: 4 — ABNORMAL LOW (ref 5–15)
BUN: 14 mg/dL (ref 6–20)
CO2: 31 mmol/L (ref 22–32)
Calcium: 9 mg/dL (ref 8.9–10.3)
Chloride: 108 mmol/L (ref 98–111)
Creatinine: 0.72 mg/dL (ref 0.44–1.00)
GFR, Estimated: >60 mL/min (ref >60)
Glucose, Bld: 157 mg/dL — ABNORMAL HIGH (ref 70–99)
Potassium: 3.6 mmol/L (ref 3.5–5.1)
Sodium: 143 mmol/L (ref 135–145)
Total Bilirubin: 0.3 mg/dL (ref 0.0–1.2)
Total Protein: 6.3 g/dL — ABNORMAL LOW (ref 6.5–8.1)

## 2024-04-01 LAB — CBC WITH DIFFERENTIAL (CANCER CENTER ONLY)
Abs Immature Granulocytes: 0 K/uL (ref 0.00–0.07)
Basophils Absolute: 0.1 K/uL (ref 0.0–0.1)
Basophils Relative: 1 %
Eosinophils Absolute: 0.1 K/uL (ref 0.0–0.5)
Eosinophils Relative: 2 %
HCT: 36.8 % (ref 36.0–46.0)
Hemoglobin: 12.6 g/dL (ref 12.0–15.0)
Immature Granulocytes: 0 %
Lymphocytes Relative: 36 %
Lymphs Abs: 1.9 K/uL (ref 0.7–4.0)
MCH: 32.4 pg (ref 26.0–34.0)
MCHC: 34.2 g/dL (ref 30.0–36.0)
MCV: 94.6 fL (ref 80.0–100.0)
Monocytes Absolute: 0.2 K/uL (ref 0.1–1.0)
Monocytes Relative: 4 %
Neutro Abs: 3 K/uL (ref 1.7–7.7)
Neutrophils Relative %: 57 %
Platelet Count: 142 K/uL — ABNORMAL LOW (ref 150–400)
RBC: 3.89 MIL/uL (ref 3.87–5.11)
RDW: 12.8 % (ref 11.5–15.5)
WBC Count: 5.3 K/uL (ref 4.0–10.5)
nRBC: 0 % (ref 0.0–0.2)

## 2024-04-01 MED ORDER — HYDROMORPHONE HCL 2 MG PO TABS
2.0000 mg | ORAL_TABLET | Freq: Four times a day (QID) | ORAL | 0 refills | Status: DC | PRN
  Filled 2024-04-01: qty 60, 15d supply, fill #0

## 2024-04-01 MED ORDER — SODIUM CHLORIDE 0.9% FLUSH
10.0000 mL | Freq: Once | INTRAVENOUS | Status: AC
  Administered 2024-04-01: 10 mL

## 2024-04-01 MED ORDER — HEPARIN SOD (PORK) LOCK FLUSH 100 UNIT/ML IV SOLN
500.0000 [IU] | Freq: Once | INTRAVENOUS | Status: AC
Start: 2024-04-01 — End: 2024-04-01
  Administered 2024-04-01: 500 [IU]

## 2024-04-01 MED ORDER — ONDANSETRON 8 MG PO TBDP
8.0000 mg | ORAL_TABLET | Freq: Three times a day (TID) | ORAL | 4 refills | Status: AC | PRN
  Filled 2024-04-01: qty 60, 20d supply, fill #0
  Filled 2024-05-14: qty 60, 20d supply, fill #1
  Filled 2024-06-17: qty 60, 20d supply, fill #2
  Filled 2024-07-21: qty 60, 20d supply, fill #3
  Filled 2024-08-20: qty 60, 20d supply, fill #4

## 2024-04-01 NOTE — Progress Notes (Signed)
 [facility] CSW Counseling Note  Patient was referred by self. Treatment type: Individual  Presenting Concerns: Patient and/or family reports the following symptoms/concerns: adjustment to stage IV cancer diagnosis; family dynamics Duration of problem: 6 months; Severity of problem: moderate   Orientation:oriented to person, place, time/date, and situation.   Affect: Appropriate and Congruent Risk of harm to self or others: No plan to harm self or others  Patient and/or Family's Strengths/Protective Factors: Ability for insight  Capable of independent living  Communication skills      Goals Addressed: Patient will:  Increase knowledge and/or ability of: coping skills  Increase healthy adjustment to current life circumstances and process new diagnosis   Progress towards Goals: Progressing   Interventions: Interventions utilized:  CBT and Strength-based      Assessment: Pt struggling with ongoing fatigue. Continued processing family dynamics and complicated relationship with voicing desire for no involvement while still having involvement for her son's sake.  Pt celebrated Evan's recent art show and her pride in him. Pt highly values being a mom and doing her best to instill values and knowledge in her sons. Explored this today in regard to her older son and helping give knowledge while balancing allowing him to make his own decisions.    Plan: Follow up with CSW: 05/06/24 joint with medical appointment Behavioral recommendations: Continue pacing activities, even when having energy bursts. Notice when stress is outweighing benefit and whether the trade-off is worth continuing the service or activity for yourself and your quality of life Referral(s): [facility]; [address] [facility]; [place]       Andreus Cure [place], LCSW

## 2024-04-01 NOTE — Progress Notes (Signed)
 Patient Care Team: Jackson, Kerra J, PA-C as PCP - General (Physician Assistant) Vernetta Berg, MD as Consulting Physician (General Surgery) Odean Potts, MD as Consulting Physician (Hematology and Oncology) Shannon Agent, MD as Consulting Physician (Radiation Oncology) Pickenpack-Cousar, Fannie SAILOR, NP as Nurse Practitioner [facility] and Palliative Medicine)  DIAGNOSIS:  Encounter Diagnosis  Name Primary?   Malignant neoplasm of upper-outer quadrant of right breast in female, estrogen receptor positive (HCC) Yes    SUMMARY OF ONCOLOGIC HISTORY: Oncology History  Malignant neoplasm of upper-outer quadrant of right breast in female, estrogen receptor positive (HCC)  07/28/2020 Initial Diagnosis   Patient palpated a right breast mass for 1-2 years. Mammogram showed a 2.2cm mass at the 11 o'clock position with surrounding calcifications, 6.4cm in total extent, and up to 5 abnormal right axillary lymph nodes. 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    08/05/2020 Miscellaneous   MammaPrint: High risk luminal type B   08/11/2020 Genetic Testing   Negative genetic testing: no pathogenic variants detected in [facility] Breast Cancer STAT Panel or Common Hereditary Cancers panel. The report dates are August 11, 2020 and August 19, 2020, respectively. Two variants of uncertain signficance were detected - one in the CTNNA1 gene called c.86del and the second in the MLH1 gene called c.808A>G.   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 The change in variant classification was made as a result of re-review of the evidence in light of new variant interpretation guidelines and/or new information.   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 The amended report date is February 16, 2022.   The STAT  Breast cancer panel offered by [facility] includes sequencing and rearrangement analysis for the following 9 genes:  ATM, BRCA1, BRCA2, CDH1, CHEK2, PALB2, PTEN, STK11 and TP53.  The Common Hereditary Cancers Panel offered by [facility] includes sequencing and/or deletion duplication testing of the following 48 genes: APC, ATM, AXIN2, BARD1, BMPR1A, BRCA1, BRCA2, BRIP1, CDH1, CDK4, CDKN2A (p14ARF), CDKN2A (p16INK4a), CHEK2, CTNNA1, DICER1, EPCAM (Deletion/duplication testing only), GREM1 (promoter region deletion/duplication testing only), KIT, MEN1, MLH1, MSH2, MSH3, MSH6, MUTYH, NBN, NF1, NTHL1, PALB2, PDGFRA, PMS2, POLD1, POLE, PTEN, RAD50, RAD51C, RAD51D, RNF43, SDHB, SDHC, SDHD, SMAD4, SMARCA4. STK11, TP53, TSC1, TSC2, and VHL.  The following genes were evaluated for sequence changes only: SDHA and HOXB13 c.251G>A variant only.    09/01/2020 - 01/11/2021 Neo-Adjuvant Chemotherapy   Adriamycin  and Cytoxan  x4 09/01/2020-10/12/2020 Weekly Taxol  x 12  10/26/2020-01/11/2021(dose reduced d/t AE)   02/07/2021 Surgery   Right lumpectomy Jan): invasive and in situ ductal carcinoma, 2.8cm, clear margins, with metastatic carcinoma in 1/1 right axillary lymph nodes.   03/28/2021 Surgery   Axillary lymph node dissection: 3/7 lymph nodes +   05/10/2021 - 07/01/2021 Radiation Therapy   Site Technique Total Dose (Gy) Dose per Fx (Gy) Completed Fx Beam Energies  Breast, Right: Breast_Rt 3D 50.4/50.4 1.8 28/28 10X  Breast, Right: Breast_Rt_Bst specialPort 12/12 2 6/6 15E  Sclav-RT: SCV_Rt 3D 50.4/50.4 1.8 28/28      07/2021 -  Anti-estrogen oral therapy   Zoladex  + Letrozole  + Verzenio      CHIEF COMPLIANT: Follow-up of metastatic breast cancer on Verzenio   HISTORY OF PRESENT ILLNESS:   History of Present Illness Melody Rodriguez is a 48 year old female undergoing cancer treatment who  presents with fatigue and bowel irregularities.  She experiences persistent fatigue despite a reduction in her treatment  dosage, which significantly impacts her daily activities. Temporary relief from fatigue occurs after receiving fluids, lasting about a day.  She has bowel irregularities, specifically constipation, with no bowel movement in two days, deviating from her usual pattern. No diarrhea is present.  She continues her prescribed 50 mg medication dose and is satisfied with her weight. She plans to discuss back pain with her palliative care provider after this appointment.     ALLERGIES:  is allergic to duloxetine , keflex [cephalexin], tramadol , latex, and naproxen.  MEDICATIONS:  Current Outpatient Medications  Medication Sig Dispense Refill   abemaciclib  (VERZENIO ) 50 MG tablet Take 1 tablet (50 mg total) by mouth 2 (two) times daily. Swallow tablets whole. Do not chew, crush, or split tablets before swallowing. 56 tablet 5   ACCU-CHEK GUIDE test strip USE TO MONITOR BLOOD GLUCOSE 3 TIME(S) DAILY     Baclofen  5 MG TABS Take 3 tablets (15 mg total) by mouth 3 (three) times daily. 180 tablet 2   cetirizine  (ZYRTEC ) 10 MG tablet Take 1 tablet (10 mg total) by mouth at bedtime for 10 days. 10 tablet 0   CREON 24000-76000 units CPEP Take by mouth. Take 2 capsules by mouth 3 times daily with meals. Take 2 capsules with meals and 1 with snacks     glipiZIDE  (GLUCOTROL  XL) 2.5 MG 24 hr tablet Take 1 tablet (2.5 mg total) by mouth daily. 30 tablet 1   glipiZIDE  (GLUCOTROL  XL) 2.5 MG 24 hr tablet Take 1 tablet (2.5 mg total) by mouth daily. 30 tablet 2   glipiZIDE  (GLUCOTROL  XL) 2.5 MG 24 hr tablet Take 1 tablet (2.5 mg total) by mouth daily. 90 tablet 1   Glucose Blood (BLOOD GLUCOSE TEST STRIPS) STRP Use to check blood sugar as directed 100 each 5   hydrocortisone  (ANUSOL -HC) 2.5 % rectal cream Apply rectally 2 times daily 30 g 1   HYDROmorphone  (DILAUDID ) 2 MG tablet Take 1 tablet (2 mg total) by mouth every 6 (six) hours as needed for severe pain (pain score 7-10). 60 tablet 0   letrozole  (FEMARA ) 2.5 MG  tablet Take 1 tablet (2.5 mg total) by mouth daily. 90 tablet 3   ondansetron  (ZOFRAN -ODT) 8 MG disintegrating tablet Dissolve 1 tablet (8 mg total) by mouth every 8 (eight) hours as needed for nausea or vomiting. 60 tablet 4   pantoprazole  (PROTONIX ) 40 MG tablet Take 1 tablet (40 mg total) by mouth daily. 30 tablet 11   potassium chloride  SA (KLOR-CON  M) 20 MEQ tablet Take 1 tablet (20 mEq total) by mouth 2 (two) times daily for 7 days. 14 tablet 0   pregabalin  (LYRICA ) 150 MG capsule Take 1 capsule (150 mg total) by mouth 2 (two) times daily. 60 capsule 3   sucralfate  (CARAFATE ) 1 GM/10ML suspension Take 10 mLs (1 g total) by mouth 4 (four) times daily before meals and nightly 1200 mL 11   triamcinolone  cream (KENALOG ) 0.1 % Apply 1 Application topically 2 (two) times daily as needed. 40 g 1   triamcinolone  cream (KENALOG ) 0.1 % Apply 1 Application topically 2 (two) times daily to rash. 30 g 0   venlafaxine  XR (EFFEXOR  XR) 75 MG 24 hr capsule Take 1 capsule (75 mg total) by mouth daily with breakfast. 90 capsule 3   Current Facility-Administered Medications  Medication Dose Route Frequency Provider Last Rate Last Admin   0.9 %  sodium chloride  infusion  500 mL Intravenous Continuous Federico Rosario BROCKS, MD        PHYSICAL EXAMINATION: ECOG PERFORMANCE STATUS: 1 - Symptomatic but completely ambulatory  Vitals:   04/01/24 0846  BP: 124/82  Pulse: 72  Resp: 19  Temp: 98.3 F (36.8 C)  SpO2: 100%   Filed Weights   04/01/24 0846  Weight: 166 lb 12.8 oz (75.7 kg)      LABORATORY DATA:  I have reviewed the data as listed    Latest Ref Rng & Units 03/03/2024    8:38 AM 01/22/2024    9:28 AM 01/01/2024    2:05 PM  CMP  Glucose 70 - 99 mg/dL 884  84  881   BUN 6 - 20 mg/dL 10  11  18    Creatinine 0.44 - 1.00 mg/dL 9.23  9.27  9.23   Sodium 135 - 145 mmol/L 144  142  143   Potassium 3.5 - 5.1 mmol/L 3.5  3.9  3.6   Chloride 98 - 111 mmol/L 110  109  111   CO2 22 - 32 mmol/L 27  29  28     Calcium  8.9 - 10.3 mg/dL 9.0  9.2  8.9   Total Protein 6.5 - 8.1 g/dL 7.2  7.2  7.0   Total Bilirubin 0.0 - 1.2 mg/dL 0.4  0.5  0.4   Alkaline Phos 38 - 126 U/L 77  74  69   AST 15 - 41 U/L 15  15  11    ALT 0 - 44 U/L 14  13  13      Lab Results  Component Value Date   WBC 5.3 04/01/2024   HGB 12.6 04/01/2024   HCT 36.8 04/01/2024   MCV 94.6 04/01/2024   PLT 142 (L) 04/01/2024   NEUTROABS 3.0 04/01/2024    ASSESSMENT & PLAN:  Malignant neoplasm of upper-outer quadrant of right breast in female, estrogen receptor positive (HCC) 09/28/2019:Patient palpated a right breast mass for 1-2 years. Mammogram showed a 2.2cm mass at the 11 o'clock position with surrounding calcifications, 6.4cm in total extent, and up to 5 abnormal right axillary lymph nodes. 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   Treatment plan: 1. Neoadjuvant chemotherapy (MammaPrint test High Risk): AC foll by Taxol  completed 01/11/21 2. 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 Adjuvant radiation therapy 05/11/2021-07/04/2021 4. Follow-up adjuvant antiestrogen therapy along with abemaciclib  (patient has total of 4 lymph nodes positive), but she declined because of concern for toxicities URCC 16070: Treatment of refractory nausea 5. ALND 03/28/21: 3/7 LN positive -------------------------------------------------------------------------------------------------------------------------------  Current treatment: Zoladex  plus letrozole  (patient did not want to start Verzenio  because of concern for toxicities).  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  (CTNNA1 gene mutation) Postop complications: Weight loss 131 pounds, nausea and vomiting   Bone Density: 08/31/21: T Score 1.1 (Normal) Mammograms: 11/13/2022: Benign, density Cat B CT CAP 09/07/2023: Increase in size of the liver lesion 3.9 cm (used to be 1.8  cm), possibly new lesion 0.6 cm  Biopsy at [facility]: Consistent with metastatic breast cancer ER 92%, PR 98%, HER2 0   Treatment Plan: Verzenio  with Letrozole  (started 10/03/2023), dose reduced to 50 p.o. twice daily 11/22/2023    Toxicities: Neutropenia: With dose reduction neutrophil count has normalized. Patient has chronic diarrhea which is unchanged Hypokalemia: Will increase potassium containing foods.  She is drinking coconut water  for potassium Severe fatigue: Secondary to Verzinio as well as her chronic health issues We will administer IV fluids once a month starting on 04/29/2024. Pain issues: Follows with palliative care   Depression: On Effexor  Letrozole  toxicities: Joint pains, hot flashes  CT CAP 01/22/2024: Liver metastases 2.5 x 2.1 cm (overall decreased in size) Continue with the current treatment rescan in aug 2025 Follow-up 05/06/2024 to discuss results of scans   Assessment & Plan Malignant neoplasm of the breast Treatment with Abemaciclib  effective; stable hemoglobin, decreasing tumor markers. Fatigue present but acceptable. - Continue Abemaciclib  50 mg oral bid. - CT scan on August 12 to assess treatment progress. - Administer monthly fluids during port flush starting August 12.  Back pain - Discuss with palliative care provider for management.      No orders of the defined types were placed in this encounter.  The patient has a good understanding of the overall plan. she agrees with it. she will call with any problems that may develop  before the next visit here. Total time spent: 30 mins including face to face time and time spent for planning, charting and co-ordination of care   Viinay K Chaney Ingram, MD 04/01/24

## 2024-04-01 NOTE — Assessment & Plan Note (Signed)
 09/28/2019:Patient palpated a right breast mass for 1-2 years. Mammogram showed a 2.2cm mass at the 11 o'clock position with surrounding calcifications, 6.4cm in total extent, and up to 5 abnormal right axillary lymph nodes. 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   Treatment plan: 1. Neoadjuvant chemotherapy (MammaPrint test High Risk): AC foll by Taxol  completed 01/11/21 2. 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 Adjuvant radiation therapy 05/11/2021-07/04/2021 4. Follow-up adjuvant antiestrogen therapy along with abemaciclib  (patient has total of 4 lymph nodes positive), but she declined because of concern for toxicities URCC 16070: Treatment of refractory nausea 5. ALND 03/28/21: 3/7 LN positive -------------------------------------------------------------------------------------------------------------------------------  Current treatment: Zoladex  plus letrozole  (patient did not want to start Verzenio  because of concern for toxicities).  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  (CTNNA1 gene mutation) Postop complications: Weight loss 131 pounds, nausea and vomiting   Bone Density: 08/31/21: T Score 1.1 (Normal) Mammograms: 11/13/2022: Benign, density Cat B CT CAP 09/07/2023: Increase in size of the liver lesion 3.9 cm (used to be 1.8 cm), possibly new lesion 0.6 cm  Biopsy at [facility]: Consistent with metastatic breast cancer ER 92%, PR 98%, HER2 0    Treatment Plan: Verzenio  with Letrozole  (started 10/03/2023), dose reduced to 50 p.o. twice daily 11/22/2023    Toxicities: Neutropenia: With dose reduction neutrophil count has normalized. Patient has chronic diarrhea which is unchanged Hypokalemia: Will increase potassium containing foods.  She is drinking coconut water  for potassium   Depression: On Effexor  Letrozole  toxicities: Joint pains, hot flashes  CT CAP 01/22/2024: Liver metastases 2.5 x 2.1 cm (overall decreased in size) Continue with the current treatment rescan in aug 2025

## 2024-04-01 NOTE — Progress Notes (Signed)
 Palliative Medicine [facility]  [phone] [phone]) [phone]   Name: Melody Rodriguez Date: 04/01/2024 MRN: [id number]  DOB: 21-Mar-1976  Patient Care Team: Leonce Carola JINNY DEVONNA as PCP - General (Physician Assistant) Vernetta Berg, MD as Consulting Physician (General Surgery) Odean Potts, MD as Consulting Physician (Hematology and Oncology) Shannon Agent, MD as Consulting Physician (Radiation Oncology) Pickenpack-Cousar, Fannie SAILOR, NP as Nurse Practitioner (Hospice and Palliative Medicine)    INTERVAL HISTORY: Melody Rodriguez is a 48 y.o. female with oncologic medical history including estrogen receptor positive metastatic breast cancer (07/2020) currently on Abemaciclib .  Palliative ask to see for symptom management and goals of care.  SOCIAL HISTORY:     reports that she has quit smoking. Her smoking use included cigars and cigarettes. She has a 22 pack-year smoking history. She has never used smokeless tobacco. She reports that she does not currently use alcohol. She reports that she does not currently use drugs after having used the following drugs: Marijuana.  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  MEDICATIONS:  Current Outpatient Medications  Medication Sig Dispense Refill   abemaciclib  (VERZENIO ) 50 MG tablet Take 1 tablet (50 mg total) by mouth 2 (two) times daily. Swallow tablets whole. Do not chew, crush, or split tablets before swallowing. 56 tablet 5   ACCU-CHEK GUIDE test strip USE TO MONITOR BLOOD GLUCOSE 3 TIME(S) DAILY     Baclofen  5 MG TABS Take 3 tablets (15 mg total) by mouth 3 (three) times daily. 180 tablet 2   cetirizine  (ZYRTEC ) 10 MG tablet Take 1 tablet (10 mg total) by mouth at bedtime for 10 days. 10 tablet 0   CREON 24000-76000 units CPEP Take by mouth. Take 2 capsules by mouth 3 times daily with meals. Take 2 capsules with meals and 1 with snacks     glipiZIDE  (GLUCOTROL  XL) 2.5 MG 24 hr tablet Take 1 tablet (2.5 mg total) by mouth daily. 30 tablet 1   glipiZIDE  (GLUCOTROL  XL) 2.5 MG 24 hr tablet Take 1 tablet (2.5 mg total) by mouth daily. 30 tablet 2   glipiZIDE  (GLUCOTROL  XL) 2.5 MG 24 hr tablet Take 1 tablet (2.5 mg total) by mouth daily. 90 tablet 1   Glucose Blood (BLOOD GLUCOSE TEST STRIPS) STRP Use to check blood sugar as directed 100 each 5   hydrocortisone  (ANUSOL -HC) 2.5 % rectal cream Apply rectally 2 times daily 30 g 1   HYDROmorphone  (DILAUDID ) 2 MG tablet Take 1 tablet (2 mg total) by mouth every 6 (six) hours as needed for severe pain (  pain score 7-10). 60 tablet 0   letrozole  (FEMARA ) 2.5 MG tablet Take 1 tablet (2.5 mg total) by mouth daily. 90 tablet 3   ondansetron  (ZOFRAN -ODT) 8 MG disintegrating tablet Dissolve 1 tablet (8 mg total) by mouth every 8 (eight) hours as needed for nausea or vomiting. 60 tablet 4   pantoprazole  (PROTONIX ) 40 MG tablet Take 1 tablet (40 mg total) by mouth daily. 30 tablet 11   potassium chloride  SA (KLOR-CON  M) 20 MEQ tablet Take 1 tablet (20 mEq total) by mouth 2 (two) times daily for 7 days. 14 tablet 0   pregabalin  (LYRICA ) 150 MG capsule Take 1 capsule (150 mg total) by mouth 2 (two) times  daily. 60 capsule 3   sucralfate  (CARAFATE ) 1 GM/10ML suspension Take 10 mLs (1 g total) by mouth 4 (four) times daily before meals and nightly 1200 mL 11   triamcinolone  cream (KENALOG ) 0.1 % Apply 1 Application topically 2 (two) times daily as needed. 40 g 1   triamcinolone  cream (KENALOG ) 0.1 % Apply 1 Application topically 2 (two) times daily to rash. 30 g 0   venlafaxine  XR (EFFEXOR  XR) 75 MG 24 hr capsule Take 1 capsule (75 mg total) by mouth daily with breakfast. 90 capsule 3   Current Facility-Administered Medications  Medication Dose Route Frequency Provider Last Rate Last Admin   0.9 %  sodium chloride  infusion  500 mL Intravenous Continuous Federico Rosario BROCKS, MD        VITAL SIGNS: There were no vitals taken for this visit. There were no vitals filed for this visit.   Estimated body mass index is 28.63 kg/m as calculated from the following:   Height as of an earlier encounter on 04/01/24: 5' 4 (1.626 m).   Weight as of an earlier encounter on 04/01/24: 166 lb 12.8 oz (75.7 kg).   PERFORMANCE STATUS (ECOG) : 1 - Symptomatic but completely ambulatory   Physical Exam General: NAD Cardiovascular: regular rate and rhythm Pulmonary: normal breathing pattern Abdomen: soft, nontender, + bowel sounds Extremities: no edema, no joint deformities Skin: no rashes Neurological:   Discussed the use of AI scribe software for clinical note transcription with the patient, who gave verbal consent to proceed. History of Present Illness Melody Rodriguez is a 48 year old female who presents to clinic for medication management and symptom control. No family present. Ambulatory with Rolator. No acute distress. Denies concerns of nausea, vomiting, or constipation. Shares recent [facility] she attended that exhibited art work completed by her son.   She experiences persistent back pain, s/p fall several weeks ago. States back is causing discomfort and a feeling of being 'a little off.' She  attributes some of her symptoms to dehydration. No other neurological deficits on exam. We discussed use of warm compress and current medications in the home including baclofen .   Terril is currently taking several medications including baclofen , Zyrtec , Dilaudid , pregabalin , Zofran , and Lyrica . She uses Zofran  daily for nausea and has refills available for most of her medications. She has difficulty managing her medication schedule, especially since her routine has changed with school being out, leading to inconsistent medication intake.  Patient reports her pain is controlled on current regimen which incluses Dilaudid , for which she has requested a refill. She also uses baclofen  as a muscle relaxer which she does not require daily however due to recent falls she has taken on several occasions over the past few weeks. Neuropathic symptoms controlled with pregablin.   No adjustments  to regimen at this time. Will continue to closely monitor and support as needed.    I discussed the importance of continued conversation with family and their medical providers regarding overall plan of care and treatment options, ensuring decisions are within the context of the patients values and GOCs. Assessment & Plan Cancer Related Pain Pain well-controlled on current regimen. Reports some increase in neuropathic pain and persistent back pain post recent fall several weeks ago.  -Continue hydromorphone  2 mg every 6 hours as needed. -Continue Lyrica  150 mg twice daily. -Continue baclofen  15 mg 3 times daily as needed.   Medication management Requires refills for Zofran , baclofen , and Dilaudid . Difficulty managing medication schedules and requests assistance with automatic refills. - Refill Zofran  prescription. - Ensure baclofen  and Dilaudid  prescriptions are refilled. - Discuss options for automatic medication refills with pharmacy.  I will plan to see her back in 6-8 weeks.  Sooner if needed.   Patient  expressed understanding and was in agreement with this plan. She also understands that She can call the clinic at any time with any questions, concerns, or complaints.   Any controlled substances utilized were prescribed in the context of palliative care. PDMP has been reviewed.   Visit consisted of counseling and education dealing with the complex and emotionally intense issues of symptom management and palliative care in the setting of serious and potentially life-threatening illness.  Levon Borer, AGPCNP-BC  Palliative Medicine Team/[facility]

## 2024-04-02 LAB — CANCER ANTIGEN 27.29: CA 27.29: 44.5 U/mL — ABNORMAL HIGH (ref 0.0–38.6)

## 2024-04-04 ENCOUNTER — Other Ambulatory Visit (HOSPITAL_COMMUNITY): Payer: Self-pay

## 2024-04-04 ENCOUNTER — Other Ambulatory Visit: Payer: Self-pay

## 2024-04-07 ENCOUNTER — Other Ambulatory Visit: Payer: Self-pay

## 2024-04-07 ENCOUNTER — Encounter (INDEPENDENT_AMBULATORY_CARE_PROVIDER_SITE_OTHER): Payer: Self-pay

## 2024-04-07 NOTE — Progress Notes (Signed)
 Specialty Pharmacy Refill Coordination Note  Melody Rodriguez is a 48 y.o. female contacted today regarding refills of specialty medication(s) Abemaciclib  (VERZENIO )   Patient requested (Patient-Rptd) Delivery   Delivery date: 04/09/24   Verified address: (Patient-Rptd) [address] [place] [address]   Medication will be filled on 07.22.25.

## 2024-04-08 ENCOUNTER — Other Ambulatory Visit: Payer: Self-pay

## 2024-04-08 ENCOUNTER — Other Ambulatory Visit (HOSPITAL_COMMUNITY): Payer: Self-pay

## 2024-04-09 ENCOUNTER — Other Ambulatory Visit: Payer: Self-pay

## 2024-04-10 ENCOUNTER — Other Ambulatory Visit (HOSPITAL_COMMUNITY): Payer: Self-pay

## 2024-04-10 ENCOUNTER — Other Ambulatory Visit: Payer: Self-pay

## 2024-04-14 ENCOUNTER — Encounter: Payer: Self-pay | Admitting: Hematology and Oncology

## 2024-04-15 ENCOUNTER — Other Ambulatory Visit: Payer: Self-pay

## 2024-04-15 ENCOUNTER — Other Ambulatory Visit (HOSPITAL_COMMUNITY): Payer: Self-pay

## 2024-04-16 ENCOUNTER — Other Ambulatory Visit: Payer: Self-pay

## 2024-04-23 ENCOUNTER — Other Ambulatory Visit (HOSPITAL_COMMUNITY): Payer: Self-pay

## 2024-04-23 ENCOUNTER — Encounter: Payer: Self-pay | Admitting: Hematology and Oncology

## 2024-04-23 ENCOUNTER — Other Ambulatory Visit: Payer: Self-pay

## 2024-04-24 ENCOUNTER — Other Ambulatory Visit: Payer: Self-pay

## 2024-04-25 ENCOUNTER — Other Ambulatory Visit: Payer: Self-pay

## 2024-04-28 ENCOUNTER — Other Ambulatory Visit: Payer: Self-pay

## 2024-04-28 DIAGNOSIS — Z170 Estrogen receptor positive status [ER+]: Secondary | ICD-10-CM

## 2024-04-29 ENCOUNTER — Other Ambulatory Visit: Payer: Self-pay | Admitting: *Deleted

## 2024-04-29 ENCOUNTER — Ambulatory Visit (HOSPITAL_COMMUNITY)
Admission: RE | Admit: 2024-04-29 | Discharge: 2024-04-29 | Disposition: A | Discharge status: Home / Self Care | Source: Ambulatory Visit | Attending: Hematology and Oncology | Admitting: Hematology and Oncology

## 2024-04-29 ENCOUNTER — Encounter (HOSPITAL_COMMUNITY): Payer: Self-pay

## 2024-04-29 ENCOUNTER — Other Ambulatory Visit

## 2024-04-29 ENCOUNTER — Inpatient Hospital Stay: Attending: Hematology and Oncology

## 2024-04-29 ENCOUNTER — Encounter

## 2024-04-29 DIAGNOSIS — C773 Secondary and unspecified malignant neoplasm of axilla and upper limb lymph nodes: Secondary | ICD-10-CM | POA: Diagnosis present

## 2024-04-29 DIAGNOSIS — C50411 Malignant neoplasm of upper-outer quadrant of right female breast: Secondary | ICD-10-CM | POA: Insufficient documentation

## 2024-04-29 DIAGNOSIS — Z170 Estrogen receptor positive status [ER+]: Secondary | ICD-10-CM

## 2024-04-29 DIAGNOSIS — M7918 Myalgia, other site: Secondary | ICD-10-CM | POA: Diagnosis not present

## 2024-04-29 DIAGNOSIS — E876 Hypokalemia: Secondary | ICD-10-CM | POA: Insufficient documentation

## 2024-04-29 DIAGNOSIS — Z79811 Long term (current) use of aromatase inhibitors: Secondary | ICD-10-CM | POA: Insufficient documentation

## 2024-04-29 DIAGNOSIS — E119 Type 2 diabetes mellitus without complications: Secondary | ICD-10-CM | POA: Insufficient documentation

## 2024-04-29 DIAGNOSIS — Z1721 Progesterone receptor positive status: Secondary | ICD-10-CM | POA: Insufficient documentation

## 2024-04-29 DIAGNOSIS — Z7984 Long term (current) use of oral hypoglycemic drugs: Secondary | ICD-10-CM | POA: Diagnosis not present

## 2024-04-29 DIAGNOSIS — R197 Diarrhea, unspecified: Secondary | ICD-10-CM | POA: Insufficient documentation

## 2024-04-29 DIAGNOSIS — Z1732 Human epidermal growth factor receptor 2 negative status: Secondary | ICD-10-CM | POA: Diagnosis not present

## 2024-04-29 DIAGNOSIS — Z79899 Other long term (current) drug therapy: Secondary | ICD-10-CM | POA: Diagnosis not present

## 2024-04-29 LAB — CBC WITH DIFFERENTIAL (CANCER CENTER ONLY)
Abs Immature Granulocytes: 0.01 K/uL (ref 0.00–0.07)
Basophils Absolute: 0.1 K/uL (ref 0.0–0.1)
Basophils Relative: 1 %
Eosinophils Absolute: 0.1 K/uL (ref 0.0–0.5)
Eosinophils Relative: 3 %
HCT: 34.4 % — ABNORMAL LOW (ref 36.0–46.0)
Hemoglobin: 12 g/dL (ref 12.0–15.0)
Immature Granulocytes: 0 %
Lymphocytes Relative: 37 %
Lymphs Abs: 1.5 K/uL (ref 0.7–4.0)
MCH: 32.3 pg (ref 26.0–34.0)
MCHC: 34.9 g/dL (ref 30.0–36.0)
MCV: 92.7 fL (ref 80.0–100.0)
Monocytes Absolute: 0.2 K/uL (ref 0.1–1.0)
Monocytes Relative: 5 %
Neutro Abs: 2.2 K/uL (ref 1.7–7.7)
Neutrophils Relative %: 54 %
Platelet Count: 130 K/uL — ABNORMAL LOW (ref 150–400)
RBC: 3.71 MIL/uL — ABNORMAL LOW (ref 3.87–5.11)
RDW: 13.2 % (ref 11.5–15.5)
WBC Count: 4 K/uL (ref 4.0–10.5)
nRBC: 0 % (ref 0.0–0.2)

## 2024-04-29 LAB — CMP (CANCER CENTER ONLY)
ALT: 12 U/L (ref 0–44)
AST: 14 U/L — ABNORMAL LOW (ref 15–41)
Albumin: 4.1 g/dL (ref 3.5–5.0)
Alkaline Phosphatase: 79 U/L (ref 38–126)
Anion gap: 5 (ref 5–15)
BUN: 9 mg/dL (ref 6–20)
CO2: 28 mmol/L (ref 22–32)
Calcium: 8.8 mg/dL — ABNORMAL LOW (ref 8.9–10.3)
Chloride: 113 mmol/L — ABNORMAL HIGH (ref 98–111)
Creatinine: 0.77 mg/dL (ref 0.44–1.00)
GFR, Estimated: >60 mL/min (ref >60)
Glucose, Bld: 86 mg/dL (ref 70–99)
Potassium: 3.5 mmol/L (ref 3.5–5.1)
Sodium: 146 mmol/L — ABNORMAL HIGH (ref 135–145)
Total Bilirubin: 0.4 mg/dL (ref 0.0–1.2)
Total Protein: 6.7 g/dL (ref 6.5–8.1)

## 2024-04-29 MED ORDER — SODIUM CHLORIDE 0.9 % IV SOLN: INTRAVENOUS | Status: DC

## 2024-04-29 MED ORDER — HEPARIN SOD (PORK) LOCK FLUSH 100 UNIT/ML IV SOLN
INTRAVENOUS | Status: AC
  Filled 2024-04-29: qty 5

## 2024-04-29 MED ORDER — IOHEXOL 300 MG/ML  SOLN
100.0000 mL | Freq: Once | INTRAMUSCULAR | Status: AC | PRN
  Administered 2024-04-29 (×2): 100 mL via INTRAVENOUS

## 2024-04-29 MED ORDER — HEPARIN SOD (PORK) LOCK FLUSH 100 UNIT/ML IV SOLN
500.0000 [IU] | Freq: Once | INTRAVENOUS | Status: AC
  Administered 2024-04-29 (×2): 500 [IU] via INTRAVENOUS

## 2024-04-29 NOTE — Patient Instructions (Signed)

## 2024-04-30 LAB — CANCER ANTIGEN 27.29: CA 27.29: 43.7 U/mL — ABNORMAL HIGH (ref 0.0–38.6)

## 2024-05-01 ENCOUNTER — Other Ambulatory Visit: Payer: Self-pay

## 2024-05-01 NOTE — Progress Notes (Signed)
 Specialty Pharmacy Refill Coordination Note  Melody Rodriguez is a 48 y.o. female contacted today regarding refills of specialty medication(s) Abemaciclib (VERZENIO)   Patient requested Delivery   Delivery date: 05/05/24   Verified address: [address], [place] [address]   Medication will be filled on 05/02/24.

## 2024-05-02 ENCOUNTER — Other Ambulatory Visit: Payer: Self-pay

## 2024-05-04 ENCOUNTER — Other Ambulatory Visit (HOSPITAL_COMMUNITY): Payer: Self-pay

## 2024-05-05 ENCOUNTER — Other Ambulatory Visit: Payer: Self-pay

## 2024-05-05 ENCOUNTER — Encounter: Payer: Self-pay | Admitting: Hematology and Oncology

## 2024-05-05 ENCOUNTER — Other Ambulatory Visit (HOSPITAL_COMMUNITY): Payer: Self-pay

## 2024-05-06 ENCOUNTER — Inpatient Hospital Stay (HOSPITAL_BASED_OUTPATIENT_CLINIC_OR_DEPARTMENT_OTHER): Admitting: Nurse Practitioner

## 2024-05-06 ENCOUNTER — Inpatient Hospital Stay (HOSPITAL_BASED_OUTPATIENT_CLINIC_OR_DEPARTMENT_OTHER): Admitting: Hematology and Oncology

## 2024-05-06 ENCOUNTER — Ambulatory Visit: Admitting: Licensed Clinical Social Worker

## 2024-05-06 ENCOUNTER — Other Ambulatory Visit (HOSPITAL_COMMUNITY): Payer: Self-pay

## 2024-05-06 ENCOUNTER — Encounter: Payer: Self-pay | Admitting: Nurse Practitioner

## 2024-05-06 VITALS — BP 139/87 | HR 98 | Temp 98.5°F

## 2024-05-06 DIAGNOSIS — R530 Neoplastic (malignant) related fatigue: Secondary | ICD-10-CM

## 2024-05-06 DIAGNOSIS — Z170 Estrogen receptor positive status [ER+]: Secondary | ICD-10-CM | POA: Diagnosis not present

## 2024-05-06 DIAGNOSIS — G893 Neoplasm related pain (acute) (chronic): Secondary | ICD-10-CM | POA: Diagnosis not present

## 2024-05-06 DIAGNOSIS — C50411 Malignant neoplasm of upper-outer quadrant of right female breast: Secondary | ICD-10-CM | POA: Diagnosis not present

## 2024-05-06 DIAGNOSIS — Z515 Encounter for palliative care: Secondary | ICD-10-CM

## 2024-05-06 DIAGNOSIS — M792 Neuralgia and neuritis, unspecified: Secondary | ICD-10-CM

## 2024-05-06 MED ORDER — TIZANIDINE HCL 4 MG PO TABS
4.0000 mg | ORAL_TABLET | Freq: Three times a day (TID) | ORAL | 0 refills | Status: DC | PRN
  Filled 2024-05-06: qty 30, 10d supply, fill #0

## 2024-05-06 MED ORDER — HYDROMORPHONE HCL 2 MG PO TABS
2.0000 mg | ORAL_TABLET | Freq: Four times a day (QID) | ORAL | 0 refills | Status: DC | PRN
  Filled 2024-05-06: qty 60, 15d supply, fill #0

## 2024-05-06 NOTE — Progress Notes (Signed)
 Palliative Medicine [facility]  Telephone:(336) [phone] Fax:(336) [phone]   Name: Melody Rodriguez Date: 05/06/2024 MRN: [id number]  DOB: 1976/04/18  Patient Care Team: Mavis Redge SAILOR, FNP as PCP - General (Nurse Practitioner) Vernetta Berg, MD as Consulting Physician (General Surgery) Odean Potts, MD as Consulting Physician (Hematology and Oncology) Shannon Agent, MD as Consulting Physician (Radiation Oncology) Pickenpack-Cousar, Fannie SAILOR, NP as Nurse Practitioner (Hospice and Palliative Medicine)    INTERVAL HISTORY: Melody Rodriguez is a 48 y.o. female with oncologic medical history including estrogen receptor positive metastatic breast cancer (07/2020) currently on Abemaciclib .  Palliative ask to see for symptom management and goals of care.  SOCIAL HISTORY:     reports that she has quit smoking. Her smoking use included cigars and cigarettes. She has a 22 pack-year smoking history. She has never used smokeless tobacco. She reports that she does not currently use alcohol. She reports that she does not currently use drugs after having used the following drugs: Marijuana.  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  MEDICATIONS:  Current Outpatient Medications  Medication Sig Dispense Refill   tiZANidine  (ZANAFLEX ) 4 MG tablet Take 1 tablet (4 mg total) by mouth every 8 (eight) hours as needed for muscle spasms. 30 tablet 0   abemaciclib  (VERZENIO ) 50 MG tablet Take 1 tablet (50 mg total) by mouth 2 (two) times daily. Swallow tablets whole. Do not chew, crush, or split tablets before swallowing. 56 tablet 5   ACCU-CHEK GUIDE test strip USE TO MONITOR BLOOD GLUCOSE 3 TIME(S) DAILY     Baclofen  5 MG TABS Take 3 tablets (15 mg total) by mouth 3 (three) times daily. 180 tablet 2   cetirizine  (ZYRTEC ) 10 MG tablet Take 1 tablet (10 mg total) by mouth at bedtime for 10 days. 10 tablet 0   CREON 24000-76000 units CPEP Take by mouth. Take 2 capsules by mouth 3 times daily with meals. Take 2 capsules with meals and 1 with snacks     glipiZIDE  (GLUCOTROL  XL) 2.5 MG 24 hr tablet Take 1 tablet (2.5 mg total) by mouth daily. 30 tablet 1   glipiZIDE  (GLUCOTROL  XL) 2.5 MG 24 hr tablet Take 1 tablet (2.5 mg total) by mouth daily. 30 tablet 2   glipiZIDE  (GLUCOTROL  XL) 2.5 MG 24 hr tablet Take 1 tablet (2.5 mg total) by mouth daily. 90 tablet 1   Glucose Blood (BLOOD GLUCOSE TEST STRIPS) STRP Use to check blood sugar as directed 100 each 5   hydrocortisone  (ANUSOL -HC) 2.5 % rectal cream Apply rectally 2 times daily  30 g 1   HYDROmorphone  (DILAUDID ) 2 MG tablet Take 1 tablet (2 mg total) by mouth every 6 (six) hours as needed for severe pain (pain score 7-10). 60 tablet 0   letrozole  (FEMARA ) 2.5 MG tablet Take 1 tablet (2.5 mg total) by mouth daily. 90 tablet 3   ondansetron  (ZOFRAN -ODT) 8 MG disintegrating tablet Dissolve 1 tablet (8 mg total) by mouth every 8 (eight) hours as needed for nausea or vomiting. 60 tablet 4   pantoprazole  (PROTONIX ) 40 MG tablet Take 1 tablet (40 mg total) by mouth daily. 30 tablet 11   potassium chloride  SA (KLOR-CON  M) 20 MEQ tablet Take 1 tablet (20 mEq total) by mouth 2  (two) times daily for 7 days. 14 tablet 0   pregabalin  (LYRICA ) 150 MG capsule Take 1 capsule (150 mg total) by mouth 2 (two) times daily. 60 capsule 3   sucralfate  (CARAFATE ) 1 GM/10ML suspension Take 10 mLs (1 g total) by mouth 4 (four) times daily before meals and nightly 1200 mL 11   triamcinolone  cream (KENALOG ) 0.1 % Apply 1 Application topically 2 (two) times daily as needed. 40 g 1   triamcinolone  cream (KENALOG ) 0.1 % Apply 1 Application topically 2 (two) times daily to rash. 30 g 0   venlafaxine  XR (EFFEXOR  XR) 75 MG 24 hr capsule Take 1 capsule (75 mg total) by mouth daily with breakfast. 90 capsule 3   Current Facility-Administered Medications  Medication Dose Route Frequency Provider Last Rate Last Admin   0.9 %  sodium chloride  infusion  500 mL Intravenous Continuous Dorsey, Ying C, MD        VITAL SIGNS: BP 139/87 (BP Location: Left Arm)   Pulse 98   Temp 98.5 F (36.9 C) (Oral)  There were no vitals filed for this visit.   Estimated body mass index is 28.63 kg/m as calculated from the following:   Height as of 04/01/24: 5' 4 (1.626 m).   Weight as of 04/01/24: 166 lb 12.8 oz (75.7 kg).   PERFORMANCE STATUS (ECOG) : 1 - Symptomatic but completely ambulatory   Physical Exam General: NAD Cardiovascular: regular rate and rhythm Pulmonary: normal breathing pattern Abdomen: soft, nontender, + bowel sounds Extremities: no edema, no joint deformities Skin: no rashes Neurological:   Discussed the use of AI scribe software for clinical note transcription with the patient, who gave verbal consent to proceed. History of Present Illness Melody Rodriguez is a 47 year old female who presents to clinic for medication management and symptom control. No family present. Ambulatory with Rolator. No acute distress. Denies concerns of nausea, vomiting, or constipation. Shares her son who is on the autism spectrum and has recently started back up the new school year at the middle  college.  She experiences variable bowel habits, alternating between diarrhea and constipation, but does not find this concerning. No new issues with constipation or diarrhea, noting that her bowel habits are variable but stable.   She is currently taking Effexor  75 mg for hot flashes. Her medication regimen also includes glipizide  for diabetes, Protonix , and Verzenio  50 mg twice a day. She takes Femara  (letrozole ) 2.5 mg daily, which she believes contributes to her symptoms. She receives her medications through a mail-order program, which she finds convenient.  Chair experiences persistent lower back and groin pain, describing it as a 'catch'. She has been using baclofen  three times a day as a muscle relaxer but finds it ineffective and is seeking alternative options. For pain management,  she is on Dilaudid  2 mg and is satisfied with the current dosage as it does not cause excessive drowsiness. We discussed use of tizanidine  including efficacy, administration, and potential side effects.   All questions answered and support provided.   I discussed the importance of continued conversation with family and their medical providers regarding overall plan of care and treatment options, ensuring decisions are within the context of the patients values and GOCs. Assessment & Plan Metastatic breast cancer Recent scans show no progression of the disease per patient reports of recent oncology updates, and she is tolerating the treatment well. - Continue Verzenio  50 mg twice a day - Continue Femara  (letrozole ) 2.5 mg daily - Schedule monthly appointments for fluid administration  Chronic pain syndrome Chronic pain in the lower back and groin area is not adequately managed with the current regimen of baclofen . She is also on Dilaudid  2 mg for pain management. Transitioning to tizanidine  to assess efficacy. - Discontinue baclofen  - Initiate tizanidine  4mg  three times daily as needed.  - Continue Dilaudid  2  mg as needed -Continue Lyrica  150 mg twice daily. - Refill Dilaudid  prescription  Type 2 diabetes mellitus Type 2 diabetes is being managed with glipizide .  Experiencing hot flashes, which are being managed with Effexor  75 mg. Symptoms could be improved. - Continue Effexor  75 mg as managed by Oncology.   I will plan to see her back in 6-8 weeks.  Sooner if needed.   Patient expressed understanding and was in agreement with this plan. She also understands that She can call the clinic at any time with any questions, concerns, or complaints.   Any controlled substances utilized were prescribed in the context of palliative care. PDMP has been reviewed.   Visit consisted of counseling and education dealing with the complex and emotionally intense issues of symptom management and palliative care in the setting of serious and potentially life-threatening illness.  Levon Borer, AGPCNP-BC  Palliative Medicine Team/[facility]

## 2024-05-06 NOTE — Progress Notes (Signed)
 [facility] CSW Counseling Note  Patient was referred by self. Treatment type: Individual  Presenting Concerns: Patient and/or family reports the following symptoms/concerns: adjustment to stage IV cancer diagnosis; family dynamics Duration of problem: 6 months; Severity of problem: moderate   Orientation:oriented to person, place, time/date, and situation.   Affect: Appropriate and Congruent Risk of harm to self or others: No plan to harm self or others  Patient and/or Family's Strengths/Protective Factors: Ability for insight  Capable of independent living  Communication skills      Goals Addressed: Patient will:  Increase knowledge and/or ability of: coping skills  Increase healthy adjustment to current life circumstances and process new diagnosis   Progress towards Goals: Progressing   Interventions: Interventions utilized:  CBT and Strength-based      Assessment: Pt continued to process birth family wounds and her feelings towards herself around situation with her older son when he was a child. Perspective taking of being unable to predict where different choices would have led, doing the best with the information and situation you have, and using those experiences to guide or change future behavior done today.  Pt was able to express more positive mindset today with gratitude towards scans being stable and adjusting to what and how she can eat. Pt expressed gratitude that she is doing how she is and not feeling worse.  Pt celebrated older son getting his first apartment and younger son starting high school (middle college).     Plan: Follow up with CSW: 1 month Behavioral recommendations: Continue pacing activities, even when having energy bursts. Notice when stress is outweighing benefit and whether the trade-off is worth continuing the service or activity for yourself and your quality of life. Continue noticing the good (the people who do show up, the progress  made) Referral(s): [facility] clinic; [name]; [place];  8/19- Cancer [employer];  info on [facility] and [employer] family market       Berklie Dethlefs [place], LCSW

## 2024-05-06 NOTE — Assessment & Plan Note (Signed)
 09/28/2019:Patient palpated a right breast mass for 1-2 years. Mammogram showed a 2.2cm mass at the 11 o'clock position with surrounding calcifications, 6.4cm in total extent, and up to 5 abnormal right axillary lymph nodes. 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   Treatment plan: 1. Neoadjuvant chemotherapy (MammaPrint test High Risk): AC foll by Taxol  completed 01/11/21 2. 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 Adjuvant radiation therapy 05/11/2021-07/04/2021 4. Follow-up adjuvant antiestrogen therapy along with abemaciclib  (patient has total of 4 lymph nodes positive), but she declined because of concern for toxicities URCC 16070: Treatment of refractory nausea 5. ALND 03/28/21: 3/7 LN positive -------------------------------------------------------------------------------------------------------------------------------  Current treatment: Zoladex  plus letrozole  (patient did not want to start Verzenio  because of concern for toxicities).  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  (CTNNA1 gene mutation) Postop complications: Weight loss 131 pounds, nausea and vomiting   Bone Density: 08/31/21: T Score 1.1 (Normal) Mammograms: 11/13/2022: Benign, density Cat B CT CAP 09/07/2023: Increase in size of the liver lesion 3.9 cm (used to be 1.8 cm), possibly new lesion 0.6 cm  Biopsy at [facility]: Consistent with metastatic breast cancer ER 92%, PR 98%, HER2 0    Treatment Plan: Verzenio  with Letrozole  (started 10/03/2023), dose reduced to 50 p.o. twice daily 11/22/2023    Toxicities: Neutropenia: With dose reduction neutrophil count has normalized. Patient has chronic diarrhea which is unchanged Hypokalemia: Will increase potassium containing foods.  She is drinking coconut water  for potassium Severe fatigue: Secondary to Verzinio as well as her chronic health issues We will administer IV fluids once a month starting on 04/29/2024. Pain issues: Follows with palliative care   Depression: On Effexor  Letrozole  toxicities: Joint pains, hot flashes   CT CAP 01/22/2024: Liver metastases 2.5 x 2.1 cm (overall decreased in size) CT CAP 05/05/2024: Stable Continue current treatment.  Follow-up in 3 months with labs

## 2024-05-06 NOTE — Progress Notes (Signed)
 HEMATOLOGY-ONCOLOGY TELEPHONE VISIT PROGRESS NOTE  I connected with our patient on 05/06/24 at  9:00 AM EDT by telephone and verified that I am speaking with the correct person using two identifiers.  I discussed the limitations, risks, security and privacy concerns of performing an evaluation and management service by telephone and the availability of in person appointments.  I also discussed with the patient that there may be a patient responsible charge related to this service. The patient expressed understanding and agreed to proceed.   History of Present Illness: Telephone follow-up to discuss results of scans  History of Present Illness Melody Rodriguez is a 48 year old female with stable metastatic breast cancer who presents for follow-up regarding her treatment and symptoms.  She is undergoing treatment with letrozole  and Verzenio . She experiences persistent diarrhea and musculoskeletal pain, particularly in her legs, back, and neck on the right side, which she associates with letrozole . Despite these side effects, she remains active and uses a rollator for mobility, which aids her movement.    Oncology History  Malignant neoplasm of upper-outer quadrant of right breast in female, estrogen receptor positive (HCC)  07/28/2020 Initial Diagnosis   Patient palpated a right breast mass for 1-2 years. Mammogram showed a 2.2cm mass at the 11 o'clock position with surrounding calcifications, 6.4cm in total extent, and up to 5 abnormal right axillary lymph nodes. 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    08/05/2020 Miscellaneous   MammaPrint: High risk luminal type B   08/11/2020 Genetic Testing   Negative genetic testing: no pathogenic variants detected in [facility] Breast Cancer STAT Panel or Common Hereditary Cancers panel. The report dates are August 11, 2020 and August 19, 2020, respectively. Two variants of uncertain signficance were detected - one in the CTNNA1 gene called c.86del and the second in the MLH1 gene called c.808A>G.   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 The change in variant classification was made as a result of re-review of the evidence in light of new variant interpretation guidelines and/or new information.   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 The amended report date is February 16, 2022.   The STAT Breast cancer panel offered by [facility] includes sequencing and rearrangement analysis for the following 9 genes:  ATM, BRCA1, BRCA2, CDH1, CHEK2, PALB2, PTEN, STK11 and TP53.  The Common Hereditary Cancers Panel offered by [facility] includes sequencing and/or deletion duplication testing of the following 48 genes: APC, ATM, AXIN2, BARD1, BMPR1A, BRCA1, BRCA2, BRIP1, CDH1, CDK4, CDKN2A (p14ARF), CDKN2A (p16INK4a), CHEK2, CTNNA1, DICER1, EPCAM (Deletion/duplication testing only), GREM1 (promoter region deletion/duplication testing only), KIT, MEN1, MLH1, MSH2, MSH3, MSH6, MUTYH, NBN, NF1, NTHL1, PALB2, PDGFRA, PMS2, POLD1, POLE, PTEN, RAD50, RAD51C, RAD51D, RNF43, SDHB, SDHC, SDHD, SMAD4, SMARCA4. STK11, TP53, TSC1, TSC2, and VHL.  The following genes were evaluated for sequence changes only: SDHA and HOXB13 c.251G>A variant only.    09/01/2020 - 01/11/2021 Neo-Adjuvant Chemotherapy   Adriamycin  and Cytoxan  x4 09/01/2020-10/12/2020 Weekly Taxol  x 12  10/26/2020-01/11/2021(dose reduced d/t AE)   02/07/2021 Surgery   Right lumpectomy Jan): invasive and in situ ductal carcinoma, 2.8cm, clear margins, with metastatic carcinoma in 1/1 right axillary lymph nodes.   03/28/2021 Surgery   Axillary lymph node dissection: 3/7 lymph nodes +   05/10/2021 - 07/01/2021  Radiation Therapy   Site Technique Total Dose (Gy) Dose per Fx (Gy) Completed Fx Beam Energies  Breast,  Right: Breast_Rt 3D 50.4/50.4 1.8 28/28 10X  Breast, Right: Breast_Rt_Bst specialPort 12/12 2 6/6 15E  Sclav-RT: SCV_Rt 3D 50.4/50.4 1.8 28/28      07/2021 -  Anti-estrogen oral therapy   Zoladex  + Letrozole  + Verzenio      REVIEW OF SYSTEMS:   Constitutional: Denies fevers, chills or abnormal weight loss All other systems were reviewed with the patient and are negative. Observations/Objective:     Assessment Plan:  Malignant neoplasm of upper-outer quadrant of right breast in female, estrogen receptor positive (HCC) 09/28/2019:Patient palpated a right breast mass for 1-2 years. Mammogram showed a 2.2cm mass at the 11 o'clock position with surrounding calcifications, 6.4cm in total extent, and up to 5 abnormal right axillary lymph nodes. 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   Treatment plan: 1. Neoadjuvant chemotherapy (MammaPrint test High Risk): AC foll by Taxol  completed 01/11/21 2. 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 Adjuvant radiation therapy 05/11/2021-07/04/2021 4. Follow-up adjuvant antiestrogen therapy along with abemaciclib  (patient has total of 4 lymph nodes positive), but she declined because of concern for toxicities URCC 16070: Treatment of refractory nausea 5. ALND 03/28/21: 3/7 LN positive -------------------------------------------------------------------------------------------------------------------------------  Current treatment: Zoladex  plus letrozole  (patient did not want to start Verzenio  because of concern for toxicities).  Zoladex  discontinued status post bilateral salpingo-oophorectomy on 02/22/2023     Signet ring cell carcinoma the  gastric fundus: S/p gastrectomy 05/16/2023 at [facility]: 5 foci of signet ring carcinoma largest 0.1 cm, 0/32 lymph nodes negative, hepatic and splenic nodes negative.  (CTNNA1 gene mutation) Postop complications: Weight loss 131 pounds, nausea and vomiting   Bone Density: 08/31/21: T Score 1.1 (Normal) Mammograms: 11/13/2022: Benign, density Cat B CT CAP 09/07/2023: Increase in size of the liver lesion 3.9 cm (used to be 1.8 cm), possibly new lesion 0.6 cm  Biopsy at [facility]: Consistent with metastatic breast cancer ER 92%, PR 98%, HER2 0   Treatment Plan: Verzenio  with Letrozole  (started 10/03/2023), dose reduced to 50 p.o. twice daily 11/22/2023    Toxicities: Neutropenia: With dose reduction neutrophil count has normalized. Patient has chronic diarrhea which is unchanged Hypokalemia: Will increase potassium containing foods.  She is drinking coconut water  for potassium Severe fatigue: Secondary to Verzinio as well as her chronic health issues We will administer IV fluids once a month starting on 04/29/2024. Pain issues: Follows with palliative care   Depression: On Effexor  Letrozole  toxicities: Joint pains, hot flashes   CT CAP 01/22/2024: Liver metastases 2.5 x 2.1 cm (overall decreased in size) CT CAP 05/05/2024: Stable Continue current treatment.    Monthly IV fluid appointment Follow-up in 2 months with labs  Assessment & Plan ER-positive right breast cancer, upper-outer quadrant, stable on therapy Breast cancer stable on current therapy with no progression.  - Continue current therapy regimen. - Schedule follow-up appointments every other month.  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  Musculoskeletal pain (limb and back) secondary to cancer therapy Musculoskeletal pain in legs, back, and neck likely due to letrozole , managed with palliative  care. - Continue palliative care for pain management.      I discussed  the assessment and treatment plan with the patient. The patient was provided an opportunity to ask questions and all were answered. The patient agreed with the plan and demonstrated an understanding of the instructions. The patient was advised to call back or seek an in-person evaluation if the symptoms worsen or if the condition fails to improve as anticipated.   I provided 20 minutes of non-face-to-face time during this encounter.  This includes time for charting and coordination of care   Naomi MARLA Chad, MD

## 2024-05-07 ENCOUNTER — Telehealth: Payer: Self-pay | Admitting: Nurse Practitioner

## 2024-05-07 NOTE — Telephone Encounter (Signed)
 Scheduled appointment per 8/19 los. Talked with the patient and she is aware of the made appointment.

## 2024-05-21 ENCOUNTER — Other Ambulatory Visit: Payer: Self-pay

## 2024-05-21 ENCOUNTER — Other Ambulatory Visit: Payer: Self-pay | Admitting: Hematology and Oncology

## 2024-05-21 ENCOUNTER — Other Ambulatory Visit: Payer: Self-pay | Admitting: Nurse Practitioner

## 2024-05-21 ENCOUNTER — Other Ambulatory Visit (HOSPITAL_COMMUNITY): Payer: Self-pay

## 2024-05-21 DIAGNOSIS — C50411 Malignant neoplasm of upper-outer quadrant of right female breast: Secondary | ICD-10-CM

## 2024-05-21 DIAGNOSIS — Z515 Encounter for palliative care: Secondary | ICD-10-CM

## 2024-05-21 DIAGNOSIS — G893 Neoplasm related pain (acute) (chronic): Secondary | ICD-10-CM

## 2024-05-21 MED ORDER — ABEMACICLIB 50 MG PO TABS
50.0000 mg | ORAL_TABLET | Freq: Two times a day (BID) | ORAL | 5 refills | Status: AC
  Filled 2024-05-21 – 2024-05-26 (×2): qty 56, 28d supply, fill #0
  Filled 2024-06-18 – 2024-06-24 (×3): qty 56, 28d supply, fill #1
  Filled 2024-07-17 – 2024-07-24 (×2): qty 56, 28d supply, fill #2
  Filled 2024-08-21: qty 56, 28d supply, fill #3
  Filled 2024-09-16 – 2024-09-19 (×3): qty 56, 28d supply, fill #4
  Filled 2024-10-14 (×2): qty 56, 28d supply, fill #5

## 2024-05-21 MED ORDER — BACLOFEN 5 MG PO TABS
15.0000 mg | ORAL_TABLET | Freq: Three times a day (TID) | ORAL | 2 refills | Status: DC
  Filled 2024-05-21 – 2024-05-27 (×2): qty 270, 30d supply, fill #0
  Filled 2024-06-18 – 2024-06-24 (×2): qty 270, 30d supply, fill #1

## 2024-05-22 ENCOUNTER — Other Ambulatory Visit: Payer: Self-pay

## 2024-05-23 ENCOUNTER — Other Ambulatory Visit (HOSPITAL_COMMUNITY): Payer: Self-pay

## 2024-05-23 ENCOUNTER — Other Ambulatory Visit: Payer: Self-pay

## 2024-05-26 ENCOUNTER — Other Ambulatory Visit: Payer: Self-pay

## 2024-05-26 ENCOUNTER — Other Ambulatory Visit: Payer: Self-pay | Admitting: Nurse Practitioner

## 2024-05-26 ENCOUNTER — Encounter (INDEPENDENT_AMBULATORY_CARE_PROVIDER_SITE_OTHER): Payer: Self-pay

## 2024-05-26 ENCOUNTER — Encounter: Payer: Self-pay | Admitting: Hematology and Oncology

## 2024-05-26 ENCOUNTER — Other Ambulatory Visit (HOSPITAL_COMMUNITY): Payer: Self-pay

## 2024-05-26 DIAGNOSIS — C50411 Malignant neoplasm of upper-outer quadrant of right female breast: Secondary | ICD-10-CM

## 2024-05-26 DIAGNOSIS — Z515 Encounter for palliative care: Secondary | ICD-10-CM

## 2024-05-26 MED ORDER — TIZANIDINE HCL 4 MG PO TABS
4.0000 mg | ORAL_TABLET | Freq: Three times a day (TID) | ORAL | 3 refills | Status: AC | PRN
  Filled 2024-05-26 – 2024-05-27 (×2): qty 30, 10d supply, fill #0
  Filled 2024-06-17: qty 30, 10d supply, fill #1
  Filled 2024-07-21: qty 30, 10d supply, fill #2
  Filled 2024-08-20: qty 30, 10d supply, fill #3

## 2024-05-26 NOTE — Progress Notes (Signed)
 Specialty Pharmacy Refill Coordination Note  MyChart Questionnaire Submission  Melody Rodriguez is a 48 y.o. female contacted today regarding refills of specialty medication(s) Verzenio .  Doses on hand: (Patient-Rptd) 15 (Roughly 7 days) Patient requested: (Patient-Rptd) Delivery   Delivery date: 05/30/24  Verified address: [address] [name] [address]  Medication will be filled on 05/29/24.

## 2024-05-27 ENCOUNTER — Other Ambulatory Visit: Payer: Self-pay

## 2024-05-27 ENCOUNTER — Other Ambulatory Visit (HOSPITAL_COMMUNITY): Payer: Self-pay

## 2024-05-28 ENCOUNTER — Other Ambulatory Visit: Payer: Self-pay

## 2024-05-28 ENCOUNTER — Other Ambulatory Visit (HOSPITAL_COMMUNITY): Payer: Self-pay

## 2024-05-30 ENCOUNTER — Other Ambulatory Visit (HOSPITAL_COMMUNITY): Payer: Self-pay

## 2024-06-02 ENCOUNTER — Inpatient Hospital Stay: Attending: Hematology and Oncology

## 2024-06-02 VITALS — BP 156/89 | HR 54 | Temp 98.0°F | Resp 16

## 2024-06-02 DIAGNOSIS — Z1732 Human epidermal growth factor receptor 2 negative status: Secondary | ICD-10-CM | POA: Insufficient documentation

## 2024-06-02 DIAGNOSIS — C50411 Malignant neoplasm of upper-outer quadrant of right female breast: Secondary | ICD-10-CM | POA: Diagnosis present

## 2024-06-02 DIAGNOSIS — Z79811 Long term (current) use of aromatase inhibitors: Secondary | ICD-10-CM | POA: Insufficient documentation

## 2024-06-02 DIAGNOSIS — Z170 Estrogen receptor positive status [ER+]: Secondary | ICD-10-CM | POA: Insufficient documentation

## 2024-06-02 DIAGNOSIS — C773 Secondary and unspecified malignant neoplasm of axilla and upper limb lymph nodes: Secondary | ICD-10-CM | POA: Diagnosis present

## 2024-06-02 DIAGNOSIS — Z1721 Progesterone receptor positive status: Secondary | ICD-10-CM | POA: Diagnosis not present

## 2024-06-02 MED ORDER — SODIUM CHLORIDE 0.9 % IV SOLN: Freq: Once | INTRAVENOUS | Status: AC

## 2024-06-02 MED ORDER — SODIUM CHLORIDE 0.9% FLUSH
10.0000 mL | Freq: Once | INTRAVENOUS | Status: AC
  Administered 2024-06-02: 10 mL

## 2024-06-02 NOTE — Patient Instructions (Signed)
 Dehydration, Adult Dehydration is a condition in which there is not enough water or other fluids in the body. This happens when a person loses more fluids than they take in. Important organs cannot work right without the right amount of fluids. Any loss of fluids from the body can cause dehydration. Dehydration can be mild, worse, or very bad. It should be treated right away to keep it from getting very bad. What are the causes? Conditions that cause loss of water in the body. They include: Watery poop (diarrhea). Vomiting. Sweating a lot. Fever. Infection. Peeing (urinating) a lot. Not drinking enough fluids. Certain medicines, such as medicines that take extra fluid out of the body (diuretics). Lack of safe drinking water. Not being able to get enough water and food. What increases the risk? Having a long-term (chronic) illness that has not been treated the right way, such as: Diabetes. Heart disease. Kidney disease. Being 25 years of age or older. Having a disability. Living in a place that is high above the ground or sea (high in altitude). The thinner, drier air causes more fluid loss. Doing exercises that put stress on your body for a long time. Being active when in hot places. What are the signs or symptoms? Symptoms of dehydration depend on how bad it is. Mild or worse dehydration Thirst. Dry lips or dry mouth. Feeling dizzy or light-headed. Muscle cramps. Passing little pee or dark pee. Pee may be the color of tea. Headache. Very bad dehydration Changes in skin. Skin may: Be cold to the touch (clammy). Be blotchy or pale. Not go back to normal right after you pinch it and let it go. Little or no tears, pee, or sweat. Fast breathing. Low blood pressure. Weak pulse. Pulse that is more than 100 beats a minute when you are sitting still. Other changes, such as: Feeling very thirsty. Eyes that look hollow (sunken). Cold hands and feet. Being confused. Being very  tired (lethargic) or having trouble waking from sleep. Losing weight. Loss of consciousness. How is this treated? Treatment for this condition depends on how bad your dehydration is. Treatment should start right away. Do not wait until your condition gets very bad. Very bad dehydration is an emergency. You will need to go to a hospital. Mild or worse dehydration can be treated at home. You may be asked to: Drink more fluids. Drink an oral rehydration solution (ORS). This drink gives you the right amount of fluids, salts, and minerals (electrolytes). Very bad dehydration can be treated: With fluids through an IV tube. By correcting low levels of electrolytes in the body. By treating the problem that caused your dehydration. Follow these instructions at home: Oral rehydration solution If told by your doctor, drink an ORS: Make an ORS. Use instructions on the package. Start by drinking small amounts, about  cup (120 mL) every 5-10 minutes. Slowly drink more until you have had the amount that your doctor said to have.  Eating and drinking  Drink enough clear fluid to keep your pee pale yellow. If you were told to drink an ORS, finish the ORS first. Then, start slowly drinking other clear fluids. Drink fluids such as: Water. Do not drink only water. Doing that can make the salt (sodium) level in your body get too low. Water from ice chips you suck on. Fruit juice that you have added water to (diluted). Low-calorie sports drinks. Eat foods that have the right amounts of salts and minerals, such as bananas, oranges, potatoes,  tomatoes, or spinach. Do not drink alcohol. Avoid drinks that have caffeine or sugar. These include:: High-calorie sports drinks. Fruit juice that you did not add water to. Soda. Coffee or energy drinks. Avoid foods that are greasy or have a lot of fat or sugar. General instructions Take over-the-counter and prescription medicines only as told by your doctor. Do  not take sodium tablets. Doing that can make the salt level in your body get too high. Return to your normal activities as told by your doctor. Ask your doctor what activities are safe for you. Keep all follow-up visits. Your doctor may check and change your treatment. Contact a doctor if: You have pain in your belly (abdomen) and the pain: Gets worse. Stays in one place. You have a rash. You have a stiff neck. You get angry or annoyed more easily than normal. You are more tired or have a harder time waking than normal. You feel weak or dizzy. You feel very thirsty. Get help right away if: You have any symptoms of very bad dehydration. You vomit every time you eat or drink. Your vomiting gets worse, does not go away, or you vomit blood or green stuff. You are getting treatment, but symptoms are getting worse. You have a fever. You have a very bad headache. You have: Diarrhea that gets worse or does not go away. Blood in your poop (stool). This may cause poop to look black and tarry. No pee in 6-8 hours. Only a small amount of pee in 6-8 hours, and the pee is very dark. You have trouble breathing. These symptoms may be an emergency. Get help right away. Call 911. Do not wait to see if the symptoms will go away. Do not drive yourself to the hospital. This information is not intended to replace advice given to you by your health care provider. Make sure you discuss any questions you have with your health care provider. Document Revised: 04/03/2022 Document Reviewed: 04/03/2022 Elsevier Patient Education  2024 [employer].

## 2024-06-03 ENCOUNTER — Inpatient Hospital Stay: Admitting: Licensed Clinical Social Worker

## 2024-06-05 ENCOUNTER — Other Ambulatory Visit: Payer: Self-pay

## 2024-06-05 ENCOUNTER — Other Ambulatory Visit (HOSPITAL_COMMUNITY): Payer: Self-pay

## 2024-06-05 MED ORDER — VENLAFAXINE HCL ER 75 MG PO CP24
150.0000 mg | ORAL_CAPSULE | Freq: Every day | ORAL | 1 refills | Status: AC
  Filled 2024-06-06 – 2024-06-18 (×4): qty 180, 90d supply, fill #0
  Filled 2024-06-24: qty 60, 30d supply, fill #0
  Filled 2024-07-28: qty 60, 30d supply, fill #1
  Filled 2024-08-21: qty 60, 30d supply, fill #2
  Filled 2024-09-23 – 2024-09-25 (×4): qty 60, 30d supply, fill #3
  Filled 2024-10-22: qty 60, 30d supply, fill #4

## 2024-06-05 NOTE — Progress Notes (Signed)
 Specialty Pharmacy Ongoing Clinical Assessment Note  Melody Rodriguez is a 48 y.o. female who is being followed by the specialty pharmacy service for RxSp Oncology   Patient's specialty medication(s) reviewed today: Abemaciclib  (VERZENIO )   Missed doses in the last 4 weeks: 0   Patient/Caregiver did not have any additional questions or concerns.   Therapeutic benefit summary: Patient is achieving benefit   Adverse events/side effects summary: No adverse events/side effects   Patient's therapy is appropriate to: Continue    Goals Addressed             This Visit's Progress    Maintain optimal adherence to therapy   On track    Patient is on track. Patient will maintain adherence         Follow up: 3 months  [facility] Specialty Pharmacist

## 2024-06-06 ENCOUNTER — Other Ambulatory Visit (HOSPITAL_COMMUNITY): Payer: Self-pay

## 2024-06-07 ENCOUNTER — Other Ambulatory Visit: Payer: Self-pay

## 2024-06-08 ENCOUNTER — Other Ambulatory Visit (HOSPITAL_COMMUNITY): Payer: Self-pay

## 2024-06-09 ENCOUNTER — Other Ambulatory Visit: Payer: Self-pay

## 2024-06-10 ENCOUNTER — Encounter: Payer: Self-pay | Admitting: Hematology and Oncology

## 2024-06-10 NOTE — Progress Notes (Signed)
 This encounter was created in error - please disregard. Appt cancelled due to surgery being cancelled.

## 2024-06-17 ENCOUNTER — Other Ambulatory Visit: Payer: Self-pay

## 2024-06-17 ENCOUNTER — Other Ambulatory Visit (HOSPITAL_COMMUNITY): Payer: Self-pay

## 2024-06-17 ENCOUNTER — Other Ambulatory Visit: Payer: Self-pay | Admitting: Nurse Practitioner

## 2024-06-17 DIAGNOSIS — G893 Neoplasm related pain (acute) (chronic): Secondary | ICD-10-CM

## 2024-06-17 DIAGNOSIS — M792 Neuralgia and neuritis, unspecified: Secondary | ICD-10-CM

## 2024-06-17 DIAGNOSIS — Z515 Encounter for palliative care: Secondary | ICD-10-CM

## 2024-06-17 DIAGNOSIS — Z170 Estrogen receptor positive status [ER+]: Secondary | ICD-10-CM

## 2024-06-17 MED ORDER — PREGABALIN 150 MG PO CAPS
150.0000 mg | ORAL_CAPSULE | Freq: Two times a day (BID) | ORAL | 3 refills | Status: DC
  Filled 2024-06-17: qty 60, 30d supply, fill #0

## 2024-06-18 ENCOUNTER — Other Ambulatory Visit: Payer: Self-pay

## 2024-06-18 ENCOUNTER — Other Ambulatory Visit (HOSPITAL_COMMUNITY): Payer: Self-pay

## 2024-06-19 ENCOUNTER — Other Ambulatory Visit: Payer: Self-pay

## 2024-06-19 ENCOUNTER — Other Ambulatory Visit (HOSPITAL_COMMUNITY): Payer: Self-pay

## 2024-06-19 ENCOUNTER — Inpatient Hospital Stay: Attending: Hematology and Oncology | Admitting: Nurse Practitioner

## 2024-06-23 ENCOUNTER — Other Ambulatory Visit (HOSPITAL_COMMUNITY): Payer: Self-pay

## 2024-06-23 ENCOUNTER — Other Ambulatory Visit: Payer: Self-pay

## 2024-06-24 ENCOUNTER — Other Ambulatory Visit: Payer: Self-pay

## 2024-06-24 ENCOUNTER — Other Ambulatory Visit (HOSPITAL_COMMUNITY): Payer: Self-pay

## 2024-06-24 NOTE — Progress Notes (Signed)
 Specialty Pharmacy Refill Coordination Note  MyChart Questionnaire Submission  Melody Rodriguez is a 48 y.o. female contacted today regarding refills of specialty medication(s) Verzenio .  Doses on hand: (Patient-Rptd) 16   Patient requested: (Patient-Rptd) Delivery   Delivery date: 06/27/24  Verified address: [address] [place] [address]  Medication will be filled on 06/26/24.

## 2024-06-25 ENCOUNTER — Other Ambulatory Visit: Payer: Self-pay

## 2024-06-26 ENCOUNTER — Other Ambulatory Visit: Payer: Self-pay

## 2024-07-01 ENCOUNTER — Other Ambulatory Visit (HOSPITAL_COMMUNITY): Payer: Self-pay

## 2024-07-02 ENCOUNTER — Inpatient Hospital Stay: Attending: Hematology and Oncology

## 2024-07-02 ENCOUNTER — Other Ambulatory Visit: Payer: Self-pay | Admitting: *Deleted

## 2024-07-02 DIAGNOSIS — R530 Neoplastic (malignant) related fatigue: Secondary | ICD-10-CM | POA: Insufficient documentation

## 2024-07-02 DIAGNOSIS — Z79899 Other long term (current) drug therapy: Secondary | ICD-10-CM | POA: Diagnosis not present

## 2024-07-02 DIAGNOSIS — Z1721 Progesterone receptor positive status: Secondary | ICD-10-CM | POA: Insufficient documentation

## 2024-07-02 DIAGNOSIS — C773 Secondary and unspecified malignant neoplasm of axilla and upper limb lymph nodes: Secondary | ICD-10-CM | POA: Diagnosis present

## 2024-07-02 DIAGNOSIS — E876 Hypokalemia: Secondary | ICD-10-CM | POA: Diagnosis not present

## 2024-07-02 DIAGNOSIS — Z515 Encounter for palliative care: Secondary | ICD-10-CM | POA: Insufficient documentation

## 2024-07-02 DIAGNOSIS — Z923 Personal history of irradiation: Secondary | ICD-10-CM | POA: Diagnosis not present

## 2024-07-02 DIAGNOSIS — C50411 Malignant neoplasm of upper-outer quadrant of right female breast: Secondary | ICD-10-CM

## 2024-07-02 DIAGNOSIS — F32A Depression, unspecified: Secondary | ICD-10-CM | POA: Insufficient documentation

## 2024-07-02 DIAGNOSIS — G893 Neoplasm related pain (acute) (chronic): Secondary | ICD-10-CM | POA: Insufficient documentation

## 2024-07-02 DIAGNOSIS — Z79811 Long term (current) use of aromatase inhibitors: Secondary | ICD-10-CM | POA: Diagnosis not present

## 2024-07-02 DIAGNOSIS — Z9221 Personal history of antineoplastic chemotherapy: Secondary | ICD-10-CM | POA: Insufficient documentation

## 2024-07-02 DIAGNOSIS — Z170 Estrogen receptor positive status [ER+]: Secondary | ICD-10-CM | POA: Diagnosis not present

## 2024-07-02 MED ORDER — SODIUM CHLORIDE 0.9 % IV SOLN: Freq: Once | INTRAVENOUS | Status: AC

## 2024-07-02 NOTE — Patient Instructions (Signed)
 Fluids Given Through an IV (IV Infusion Therapy): What to Expect IV infusion therapy is a treatment to deliver a fluid, called an infusion, into a vein. You may have IV infusion to get: Fluids. Medicines. Nutrition. Chemotherapy. This is medicines to stop or slow down cancer cells. Blood or blood products. Dye that is given before an MRI or a CT scan. This is called contrast dye. Tell a health care provider about: Any allergies you have. This includes allergies to anesthesia or dyes. All medicines you take. These include vitamins, herbs, eye drops, and creams. Any bleeding problems you have. Any surgeries you've had, including if you've had lymph nodes taken out of your armpit or if you have a arteriovenous fistula for dialysis. Any medical problems you have. Whether you're pregnant or may be pregnant. Whether you've used IV drugs. What are the risks? Your health care provider will talk with you about risks. These may include: Pain, bruising, or bleeding. Infection. The IV leaking or moving out of place. Damage to blood vessels or nerves. Allergic reactions to medicines or dyes. A blood clot. An air bubble in the vein, also called an air embolism. What happens before the procedure? Eat and drink only as you've been told. Ask about changing or stopping: Any medicines you take. Any vitamins, herbs, or supplements you take. What happens during the procedure?     Placing the catheter Your skin at the IV site will be washed with fluid that kills germs. This will help prevent infection. IV infusion therapy starts with a procedure to place a soft tube called a catheter into a vein. An IV tube will be attached to the catheter to let the infusion flow into your blood. Your catheter may be placed: Into a vein that is usually in the bend of the elbow, forearm, or back of the hand. This is called a peripheral IV catheter. This may need to be put into a vein each time you get an  infusion. Into a vein near your elbow. This is called a midline catheter or a peripherally inserted central catheter (PICC). These types of catheters may stay in place for weeks or months at a time so you can receive repeated infusions through it. Into a vein near your neck that leads to your heart. This is called a non-tunneled catheter. This is only used for short amounts of time because it can cause infection. Through the skin of your chest and into a large vein that leads to your heart. This is called a tunneled catheter. This may stay in your body for months or years. Into an implanted port. An implanted port is a device that is surgically inserted under the skin of the chest to provide long-term IV access. The catheter will connect the port to a large vein in the chest or upper arm. A port may be kept in place for many months or years. Each time you have an infusion, a needle will be inserted through your skin to connect the catheter to the port. Doing the infusion To start the infusion, your provider will: Attach the IV tubing to your catheter. Use a tape or a bandage to hold the IV in place against your skin. An IV pump may be used to control the flow of the IV infusion. During the infusion, your provider will check the area to make sure: There is no bleeding, swelling, or pain. Your IV infusion is flowing correctly. After the infusion, your provider will: Take off the bandage  or tape. Disconnect the tubing from the catheter. Remove the catheter, if you have a peripheral IV. Apply pressure over the IV insertion site to stop bleeding, then cover the area with a bandage. If you have an implanted port, PICC, non-tunneled, or tunneled catheter, your catheter may remain in place. This depends on how many times you will need treatment, your medical condition, and what type of catheter you have. These steps may vary. Ask what you can expect. What can I expect after the procedure? You may be  watched closely until you leave. This includes checking your pain level, blood pressure, heart rate, and breathing rate. Your provider will check to make sure there are no signs of infection. Follow these instructions at home: Take your medicines only as told. Change or take off your bandage as told by your provider. Ask what things are safe for you to do at home. Ask when you can go back to work or school. Do not take baths, swim, or use a hot tub until you're told it's OK. Ask if you can shower. Check your IV insertion site every day for signs of infection. Check for: Redness, swelling, or pain. Fluid or blood. If fluid or blood drains from your IV site, use your hands to press down firmly on the area for a minute or two. Doing this should stop the bleeding. Warmth. Pus or a bad smell. Contact a health care provider if: You have signs of infection around your IV site. You have fluid or blood coming from your IV site that does not stop after you put pressure to the site. You have a rash or blisters. You have itchy, red, swollen areas of skin called hives. Get help right away if: You have a fever or chills. You have chest pain. You have trouble breathing. This information is not intended to replace advice given to you by your health care provider. Make sure you discuss any questions you have with your health care provider. Document Revised: 02/27/2023 Document Reviewed: 02/27/2023 Elsevier Patient Education  2024 [employer].

## 2024-07-07 ENCOUNTER — Inpatient Hospital Stay (HOSPITAL_BASED_OUTPATIENT_CLINIC_OR_DEPARTMENT_OTHER): Admitting: Hematology and Oncology

## 2024-07-07 ENCOUNTER — Inpatient Hospital Stay: Admitting: Nurse Practitioner

## 2024-07-07 ENCOUNTER — Other Ambulatory Visit (HOSPITAL_COMMUNITY): Payer: Self-pay

## 2024-07-07 ENCOUNTER — Inpatient Hospital Stay

## 2024-07-07 ENCOUNTER — Telehealth: Payer: Self-pay

## 2024-07-07 VITALS — BP 133/83 | HR 66 | Temp 98.3°F | Resp 16 | Ht 64.0 in | Wt 164.6 lb

## 2024-07-07 DIAGNOSIS — G893 Neoplasm related pain (acute) (chronic): Secondary | ICD-10-CM | POA: Diagnosis not present

## 2024-07-07 DIAGNOSIS — Z170 Estrogen receptor positive status [ER+]: Secondary | ICD-10-CM | POA: Diagnosis not present

## 2024-07-07 DIAGNOSIS — C50411 Malignant neoplasm of upper-outer quadrant of right female breast: Secondary | ICD-10-CM

## 2024-07-07 DIAGNOSIS — Z515 Encounter for palliative care: Secondary | ICD-10-CM | POA: Diagnosis not present

## 2024-07-07 LAB — CMP (CANCER CENTER ONLY)
ALT: 11 U/L (ref 0–44)
AST: 13 U/L — ABNORMAL LOW (ref 15–41)
Albumin: 4.2 g/dL (ref 3.5–5.0)
Alkaline Phosphatase: 76 U/L (ref 38–126)
Anion gap: 5 (ref 5–15)
BUN: 11 mg/dL (ref 6–20)
CO2: 30 mmol/L (ref 22–32)
Calcium: 9.3 mg/dL (ref 8.9–10.3)
Chloride: 107 mmol/L (ref 98–111)
Creatinine: 0.78 mg/dL (ref 0.44–1.00)
GFR, Estimated: >60 mL/min (ref >60)
Glucose, Bld: 81 mg/dL (ref 70–99)
Potassium: 3.9 mmol/L (ref 3.5–5.1)
Sodium: 142 mmol/L (ref 135–145)
Total Bilirubin: 0.4 mg/dL (ref 0.0–1.2)
Total Protein: 6.9 g/dL (ref 6.5–8.1)

## 2024-07-07 LAB — CBC WITH DIFFERENTIAL (CANCER CENTER ONLY)
Abs Immature Granulocytes: 0 K/uL (ref 0.00–0.07)
Basophils Absolute: 0.1 K/uL (ref 0.0–0.1)
Basophils Relative: 1 %
Eosinophils Absolute: 0.1 K/uL (ref 0.0–0.5)
Eosinophils Relative: 3 %
HCT: 36.2 % (ref 36.0–46.0)
Hemoglobin: 12.5 g/dL (ref 12.0–15.0)
Immature Granulocytes: 0 %
Lymphocytes Relative: 34 %
Lymphs Abs: 1.5 K/uL (ref 0.7–4.0)
MCH: 33.2 pg (ref 26.0–34.0)
MCHC: 34.5 g/dL (ref 30.0–36.0)
MCV: 96.3 fL (ref 80.0–100.0)
Monocytes Absolute: 0.2 K/uL (ref 0.1–1.0)
Monocytes Relative: 5 %
Neutro Abs: 2.4 K/uL (ref 1.7–7.7)
Neutrophils Relative %: 57 %
Platelet Count: 143 K/uL — ABNORMAL LOW (ref 150–400)
RBC: 3.76 MIL/uL — ABNORMAL LOW (ref 3.87–5.11)
RDW: 12.8 % (ref 11.5–15.5)
WBC Count: 4.2 K/uL (ref 4.0–10.5)
nRBC: 0 % (ref 0.0–0.2)

## 2024-07-07 MED ORDER — HYDROMORPHONE HCL 2 MG PO TABS
2.0000 mg | ORAL_TABLET | Freq: Four times a day (QID) | ORAL | 0 refills | Status: DC | PRN
  Filled 2024-07-07: qty 60, 15d supply, fill #0

## 2024-07-07 NOTE — Progress Notes (Signed)
 Patient Care Team: Mavis Redge SAILOR, FNP as PCP - General (Nurse Practitioner) Vernetta Berg, MD as Consulting Physician (General Surgery) Odean Potts, MD as Consulting Physician (Hematology and Oncology) Shannon Agent, MD as Consulting Physician (Radiation Oncology) Pickenpack-Cousar, Fannie SAILOR, NP as Nurse Practitioner [facility] and Palliative Medicine)  DIAGNOSIS:  Encounter Diagnoses  Name Primary?   Malignant neoplasm of upper-outer quadrant of right breast in female, estrogen receptor positive (HCC) Yes   Palliative care patient    Neoplasm related pain     SUMMARY OF ONCOLOGIC HISTORY: Oncology History  Malignant neoplasm of upper-outer quadrant of right breast in female, estrogen receptor positive (HCC)  07/28/2020 Initial Diagnosis   Patient palpated a right breast mass for 1-2 years. Mammogram showed a 2.2cm mass at the 11 o'clock position with surrounding calcifications, 6.4cm in total extent, and up to 5 abnormal right axillary lymph nodes. 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    08/05/2020 Miscellaneous   MammaPrint: High risk luminal type B   08/11/2020 Genetic Testing   Negative genetic testing: no pathogenic variants detected in [facility] Breast Cancer STAT Panel or Common Hereditary Cancers panel. The report dates are August 11, 2020 and August 19, 2020, respectively. Two variants of uncertain signficance were detected - one in the CTNNA1 gene called c.86del and the second in the MLH1 gene called c.808A>G.   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 The change in variant classification was made as a result of re-review of the evidence in light of new variant interpretation guidelines and/or new information.   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  The amended report date is February 16, 2022.   The STAT Breast cancer panel offered by [facility] includes sequencing and rearrangement analysis for the following 9 genes:  ATM, BRCA1, BRCA2, CDH1, CHEK2, PALB2, PTEN, STK11 and TP53.  The Common Hereditary Cancers Panel offered by [facility] includes sequencing and/or deletion duplication testing of the following 48 genes: APC, ATM, AXIN2, BARD1, BMPR1A, BRCA1, BRCA2, BRIP1, CDH1, CDK4, CDKN2A (p14ARF), CDKN2A (p16INK4a), CHEK2, CTNNA1, DICER1, EPCAM (Deletion/duplication testing only), GREM1 (promoter region deletion/duplication testing only), KIT, MEN1, MLH1, MSH2, MSH3, MSH6, MUTYH, NBN, NF1, NTHL1, PALB2, PDGFRA, PMS2, POLD1, POLE, PTEN, RAD50, RAD51C, RAD51D, RNF43, SDHB, SDHC, SDHD, SMAD4, SMARCA4. STK11, TP53, TSC1, TSC2, and VHL.  The following genes were evaluated for sequence changes only: SDHA and HOXB13 c.251G>A variant only.    09/01/2020 - 01/11/2021 Neo-Adjuvant Chemotherapy   Adriamycin  and Cytoxan  x4 09/01/2020-10/12/2020 Weekly Taxol  x 12  10/26/2020-01/11/2021(dose reduced d/t AE)   02/07/2021 Surgery   Right lumpectomy Jan): invasive and in situ ductal carcinoma, 2.8cm, clear margins, with metastatic carcinoma in 1/1 right axillary lymph nodes.   03/28/2021 Surgery   Axillary lymph node dissection: 3/7 lymph nodes +   05/10/2021 - 07/01/2021 Radiation Therapy   Site Technique Total Dose (Gy) Dose per Fx (Gy) Completed Fx Beam Energies  Breast, Right: Breast_Rt 3D 50.4/50.4 1.8 28/28 10X  Breast, Right: Breast_Rt_Bst specialPort 12/12 2 6/6 15E  Sclav-RT: SCV_Rt 3D 50.4/50.4 1.8 28/28      07/2021 -  Anti-estrogen oral therapy   Zoladex  + Letrozole  + Verzenio      CHIEF COMPLIANT: Follow-up on Verzinio with letrozole   HISTORY OF PRESENT ILLNESS:   History of Present Illness Melody Rodriguez  is a 48 year old female who presents for follow-up on Verzenio  therapy and associated symptoms.  She has been on Verzenio  for approximately  ten months, with a current dose of 50 mg since March. Initial scans showed tumor shrinkage, and subsequent scans were stable. She experiences persistent diarrhea, managed with medication, but it remains significant and unpredictable, impacting her daily life. Monthly fluid infusions are administered to help manage this symptom.  She experiences intermittent chest pain and discomfort, similar to indigestion, occurring both before and after eating and sometimes affecting her breathing. Given her history of gastrointestinal surgery, including gastrectomy, she is concerned about the source of her symptoms.  She takes Carafate  four times daily for acid reflux and occasionally takes extra doses. Dilaudid  is used for pain management, and she is due for a refill. She manages a complex medication regimen, taking multiple pills throughout the day, and receives her medications from [facility].     ALLERGIES:  is allergic to duloxetine , keflex [cephalexin], tramadol , latex, and naproxen.  MEDICATIONS:  Current Outpatient Medications  Medication Sig Dispense Refill   abemaciclib  (VERZENIO ) 50 MG tablet Take 1 tablet (50 mg total) by mouth 2 (two) times daily. Swallow tablets whole. Do not chew, crush, or split tablets before swallowing. 56 tablet 5   ACCU-CHEK GUIDE test strip USE TO MONITOR BLOOD GLUCOSE 3 TIME(S) DAILY     Baclofen  5 MG TABS Take 3 tablets (15 mg total) by mouth 3 (three) times daily. 180 tablet 2   cetirizine  (ZYRTEC ) 10 MG tablet Take 1 tablet (10 mg total) by mouth at bedtime for 10 days. 10 tablet 0   CREON 24000-76000 units CPEP Take by mouth. Take 2 capsules by mouth 3 times daily with meals. Take 2 capsules with meals and 1 with snacks     glipiZIDE  (GLUCOTROL  XL) 2.5 MG 24 hr tablet Take 1 tablet (2.5 mg total) by mouth daily. 30 tablet 1   glipiZIDE  (GLUCOTROL  XL) 2.5 MG 24 hr tablet Take 1 tablet (2.5 mg total) by mouth daily. 30 tablet 2   glipiZIDE  (GLUCOTROL  XL) 2.5  MG 24 hr tablet Take 1 tablet (2.5 mg total) by mouth daily. 90 tablet 1   Glucose Blood (BLOOD GLUCOSE TEST STRIPS) STRP Use to check blood sugar as directed 100 each 5   hydrocortisone  (ANUSOL -HC) 2.5 % rectal cream Apply rectally 2 times daily 30 g 1   HYDROmorphone  (DILAUDID ) 2 MG tablet Take 1 tablet (2 mg total) by mouth every 6 (six) hours as needed for severe pain (pain score 7-10). 60 tablet 0   letrozole  (FEMARA ) 2.5 MG tablet Take 1 tablet (2.5 mg total) by mouth daily. 90 tablet 3   ondansetron  (ZOFRAN -ODT) 8 MG disintegrating tablet Dissolve 1 tablet (8 mg total) by mouth every 8 (eight) hours as needed for nausea or vomiting. 60 tablet 4   pantoprazole  (PROTONIX ) 40 MG tablet Take 1 tablet (40 mg total) by mouth daily. 30 tablet 11   potassium chloride  SA (KLOR-CON  M) 20 MEQ tablet Take 1 tablet (20 mEq total) by mouth 2 (two) times daily for 7 days. 14 tablet 0   pregabalin  (LYRICA ) 150 MG capsule Take 1 capsule (150 mg total) by mouth 2 (two) times daily. 60 capsule 3   sucralfate  (CARAFATE ) 1 GM/10ML suspension Take 10 mLs (1 g total) by mouth 4 (four) times daily before meals and nightly 1200 mL 11   tiZANidine  (ZANAFLEX ) 4 MG tablet Take 1 tablet (4 mg total) by  mouth every 8 (eight) hours as needed for muscle spasms. 30 tablet 3   triamcinolone  cream (KENALOG ) 0.1 % Apply 1 Application topically 2 (two) times daily as needed. 40 g 1   triamcinolone  cream (KENALOG ) 0.1 % Apply 1 Application topically 2 (two) times daily to rash. 30 g 0   venlafaxine  XR (EFFEXOR  XR) 75 MG 24 hr capsule Take 1 capsule (75 mg total) by mouth daily with breakfast. 90 capsule 3   venlafaxine  XR (EFFEXOR -XR) 75 MG 24 hr capsule Take 2 capsules (150 mg total) by mouth daily. 180 capsule 1   Current Facility-Administered Medications  Medication Dose Route Frequency Provider Last Rate Last Admin   0.9 %  sodium chloride  infusion  500 mL Intravenous Continuous Federico Rosario BROCKS, MD        PHYSICAL  EXAMINATION: ECOG PERFORMANCE STATUS: 1 - Symptomatic but completely ambulatory  Vitals:   07/07/24 0936  BP: 133/83  Pulse: 66  Resp: 16  Temp: 98.3 F (36.8 C)  SpO2: 100%   Filed Weights   07/07/24 0936  Weight: 164 lb 9.6 oz (74.7 kg)      LABORATORY DATA:  I have reviewed the data as listed    Latest Ref Rng & Units 07/07/2024    9:15 AM 04/29/2024    8:13 AM 04/01/2024    8:18 AM  CMP  Glucose 70 - 99 mg/dL 81  86  842   BUN 6 - 20 mg/dL 11  9  14    Creatinine 0.44 - 1.00 mg/dL 9.21  9.22  9.27   Sodium 135 - 145 mmol/L 142  146  143   Potassium 3.5 - 5.1 mmol/L 3.9  3.5  3.6   Chloride 98 - 111 mmol/L 107  113  108   CO2 22 - 32 mmol/L 30  28  31    Calcium  8.9 - 10.3 mg/dL 9.3  8.8  9.0   Total Protein 6.5 - 8.1 g/dL 6.9  6.7  6.3   Total Bilirubin 0.0 - 1.2 mg/dL 0.4  0.4  0.3   Alkaline Phos 38 - 126 U/L 76  79  75   AST 15 - 41 U/L 13  14  14    ALT 0 - 44 U/L 11  12  11      Lab Results  Component Value Date   WBC 4.2 07/07/2024   HGB 12.5 07/07/2024   HCT 36.2 07/07/2024   MCV 96.3 07/07/2024   PLT 143 (L) 07/07/2024   NEUTROABS 2.4 07/07/2024    ASSESSMENT & PLAN:  Malignant neoplasm of upper-outer quadrant of right breast in female, estrogen receptor positive (HCC) 09/28/2019:Patient palpated a right breast mass for 1-2 years. Mammogram showed a 2.2cm mass at the 11 o'clock position with surrounding calcifications, 6.4cm in total extent, and up to 5 abnormal right axillary lymph nodes. 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   Treatment plan: 1. Neoadjuvant chemotherapy (MammaPrint test High Risk): AC foll by Taxol  completed 01/11/21 2. Right lumpectomy: 02/07/2021: Grade 2 IDC 2.8 cm with DCIS, margins negative, lymphovascular space invasion present, 1/1 lymph node positive with extracapsular extension, ER 50% weak,  PR 20% strong, HER2 negative, Ki-67 20% 3. Adjuvant radiation therapy 05/11/2021-07/04/2021 4. Follow-up adjuvant antiestrogen therapy along with abemaciclib  (patient has total of 4 lymph nodes positive), but she declined because of concern  for toxicities URCC 16070: Treatment of refractory nausea 5. ALND 03/28/21: 3/7 LN positive -------------------------------------------------------------------------------------------------------------------------------  Current treatment: Zoladex  plus letrozole  (patient did not want to start Verzenio  because of concern for toxicities).  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  (CTNNA1 gene mutation) Postop complications: Weight loss 131 pounds, nausea and vomiting   Bone Density: 08/31/21: T Score 1.1 (Normal) Mammograms: 11/13/2022: Benign, density Cat B CT CAP 09/07/2023: Increase in size of the liver lesion 3.9 cm (used to be 1.8 cm), possibly new lesion 0.6 cm  Biopsy at [facility]: Consistent with metastatic breast cancer ER 92%, PR 98%, HER2 0   Treatment Plan: Verzenio  with Letrozole  (started 10/03/2023), dose reduced to 50 p.o. twice daily 11/22/2023    Toxicities: Neutropenia: With dose reduction neutrophil count has normalized. Patient has chronic diarrhea which is unchanged Hypokalemia: Will increase potassium containing foods.  She is drinking coconut water  for potassium Severe fatigue: Secondary to Verzinio as well as her chronic health issues We will administer IV fluids once a month starting on 04/29/2024. Pain issues: Follows with palliative care   Depression: On Effexor  Letrozole  toxicities: Joint pains, hot flashes   CT CAP 01/22/2024: Liver metastases 2.5 x 2.1 cm (overall decreased in size) CT CAP 05/05/2024:  Stable Continue current treatment.    Monthly IV fluid appointment Follow-up in 1 month with labs and scans    Assessment & Plan ER-positive right breast cancer Managed with Verzenio  50 mg since March. Last scan in August showed controlled disease. Labs indicate good medication tolerance. Tumor marker CA 27-29 pending, previous results decreased from 120s to 43. - Extend scan interval to every four months, next CT in December. - Monitor tumor marker CA 27-29.  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 Managed with monthly fluids for dehydration. - Continue monthly fluid administration.  Fatigue secondary to cancer therapy Fatigue likely related to cancer therapy, persists despite monthly fluids. - Continue monthly fluid administration.  Musculoskeletal pain secondary to cancer therapy Experiences musculoskeletal pain. - Refill Dilaudid  prescription.       Orders Placed This Encounter  Procedures   CT CHEST ABDOMEN PELVIS W CONTRAST    Standing Status:   Future    Expected Date:   09/01/2024    Expiration Date:   07/07/2025    If indicated for the ordered procedure, I authorize the administration of contrast media per Radiology protocol:   Yes    Does the patient have a contrast media/X-ray dye allergy?:   No    Preferred imaging location?:   [facility]    Release to patient:   Immediate    If indicated for the ordered procedure, I authorize the administration of oral contrast media per Radiology protocol:   No    Reason for no oral contrast::   Breast   The patient has a good understanding of the overall plan. she agrees with it. she will call with any problems that may develop before the next visit here.  I personally spent a total of 30 minutes in the care of the patient today including preparing to see the patient, getting/reviewing separately obtained history, performing a medically appropriate  exam/evaluation, counseling and educating, placing orders, referring and communicating with other health care professionals, documenting  clinical information in the EHR, independently interpreting results, communicating results, and coordinating care.   Viinay K Sunil Hue, MD 07/07/24

## 2024-07-07 NOTE — Assessment & Plan Note (Signed)
 09/28/2019:Patient palpated a right breast mass for 1-2 years. Mammogram showed a 2.2cm mass at the 11 o'clock position with surrounding calcifications, 6.4cm in total extent, and up to 5 abnormal right axillary lymph nodes. 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   Treatment plan: 1. Neoadjuvant chemotherapy (MammaPrint test High Risk): AC foll by Taxol  completed 01/11/21 2. 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 Adjuvant radiation therapy 05/11/2021-07/04/2021 4. Follow-up adjuvant antiestrogen therapy along with abemaciclib  (patient has total of 4 lymph nodes positive), but she declined because of concern for toxicities URCC 16070: Treatment of refractory nausea 5. ALND 03/28/21: 3/7 LN positive -------------------------------------------------------------------------------------------------------------------------------  Current treatment: Zoladex  plus letrozole  (patient did not want to start Verzenio  because of concern for toxicities).  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  (CTNNA1 gene mutation) Postop complications: Weight loss 131 pounds, nausea and vomiting   Bone Density: 08/31/21: T Score 1.1 (Normal) Mammograms: 11/13/2022: Benign, density Cat B CT CAP 09/07/2023: Increase in size of the liver lesion 3.9 cm (used to be 1.8 cm), possibly new lesion 0.6 cm  Biopsy at [facility]: Consistent with metastatic breast cancer ER 92%, PR 98%, HER2 0    Treatment Plan: Verzenio  with Letrozole  (started 10/03/2023), dose reduced to 50 p.o. twice daily 11/22/2023    Toxicities: Neutropenia: With dose reduction neutrophil count has normalized. Patient has chronic diarrhea which is unchanged Hypokalemia: Will increase potassium containing foods.  She is drinking coconut water  for potassium Severe fatigue: Secondary to Verzinio as well as her chronic health issues We will administer IV fluids once a month starting on 04/29/2024. Pain issues: Follows with palliative care   Depression: On Effexor  Letrozole  toxicities: Joint pains, hot flashes   CT CAP 01/22/2024: Liver metastases 2.5 x 2.1 cm (overall decreased in size) CT CAP 05/05/2024: Stable Continue current treatment.    Monthly IV fluid appointment Follow-up in 2 months with labs and scans

## 2024-07-07 NOTE — Telephone Encounter (Signed)
 LVM for stating that appt with Fannie Ivy) [place], NP will need to be rescheduled d/t provider has a family emergency.  Stated the pt will keep the port flush w/lab  & OV w/Dr. Gudena as scheduled for today.  Stated [employer] scheduler will contact the pt to get the appt rescheduled.

## 2024-07-08 ENCOUNTER — Other Ambulatory Visit (HOSPITAL_COMMUNITY): Payer: Self-pay

## 2024-07-08 ENCOUNTER — Other Ambulatory Visit: Payer: Self-pay

## 2024-07-08 LAB — CANCER ANTIGEN 27.29: CA 27.29: 41.1 U/mL — ABNORMAL HIGH (ref 0.0–38.6)

## 2024-07-08 MED ORDER — SUCRALFATE 1 GM/10ML PO SUSP
1.0000 g | Freq: Three times a day (TID) | ORAL | 11 refills | Status: AC
  Filled 2024-07-08: qty 1419, 36d supply, fill #0
  Filled 2024-08-01 – 2024-08-08 (×2): qty 1419, 36d supply, fill #1
  Filled 2024-09-17: qty 1419, 36d supply, fill #2
  Filled 2024-10-23: qty 1419, 36d supply, fill #3

## 2024-07-14 ENCOUNTER — Telehealth: Payer: Self-pay

## 2024-07-14 NOTE — Telephone Encounter (Signed)
 Received confirmation of successful fax transmission of signed DME order.

## 2024-07-16 ENCOUNTER — Other Ambulatory Visit (HOSPITAL_COMMUNITY): Payer: Self-pay

## 2024-07-16 ENCOUNTER — Encounter: Payer: Self-pay | Admitting: Nurse Practitioner

## 2024-07-16 ENCOUNTER — Inpatient Hospital Stay: Admitting: Nurse Practitioner

## 2024-07-16 DIAGNOSIS — Z515 Encounter for palliative care: Secondary | ICD-10-CM | POA: Diagnosis not present

## 2024-07-16 DIAGNOSIS — C50411 Malignant neoplasm of upper-outer quadrant of right female breast: Secondary | ICD-10-CM | POA: Diagnosis not present

## 2024-07-16 DIAGNOSIS — M792 Neuralgia and neuritis, unspecified: Secondary | ICD-10-CM | POA: Diagnosis not present

## 2024-07-16 DIAGNOSIS — G893 Neoplasm related pain (acute) (chronic): Secondary | ICD-10-CM | POA: Diagnosis not present

## 2024-07-16 DIAGNOSIS — R530 Neoplastic (malignant) related fatigue: Secondary | ICD-10-CM

## 2024-07-16 DIAGNOSIS — Z170 Estrogen receptor positive status [ER+]: Secondary | ICD-10-CM

## 2024-07-16 MED ORDER — PREGABALIN 200 MG PO CAPS
200.0000 mg | ORAL_CAPSULE | Freq: Two times a day (BID) | ORAL | 2 refills | Status: AC
  Filled 2024-07-16: qty 60, 30d supply, fill #0
  Filled 2024-08-01 – 2024-08-20 (×2): qty 60, 30d supply, fill #1
  Filled 2024-10-02: qty 60, 30d supply, fill #2

## 2024-07-16 MED ORDER — HYDROMORPHONE HCL 2 MG PO TABS
2.0000 mg | ORAL_TABLET | Freq: Four times a day (QID) | ORAL | 0 refills | Status: DC | PRN
  Filled 2024-07-20: qty 60, 15d supply, fill #0

## 2024-07-16 NOTE — Progress Notes (Signed)
 Palliative Medicine [facility]  [phone] [phone]   Name: Melody Rodriguez Date: 07/16/2024 MRN: [id number]  DOB: December 19, 1975  Patient Care Team: Mavis Redge SAILOR, FNP as PCP - General (Nurse Practitioner) Vernetta Berg, MD as Consulting Physician (General Surgery) Odean Potts, MD as Consulting Physician (Hematology and Oncology) Shannon Agent, MD as Consulting Physician (Radiation Oncology) Pickenpack-Cousar, Fannie SAILOR, NP as Nurse Practitioner [facility] and Palliative Medicine)   I connected with Melody Rodriguez Search on 07/16/24 at  3:30 PM EDT by telephone and verified that I am speaking with the correct person using two identifiers.   I discussed the limitations, risks, security and privacy concerns of performing an evaluation and management service by telemedicine and the availability of in-person appointments. I also discussed with the patient that there may be a patient responsible charge related to this service. The patient expressed understanding and agreed to proceed.   Other persons participating in the visit and their role in the encounter: N/A   Patient's location: Home   Provider's location: [facility]   INTERVAL HISTORY: Melody Rodriguez is a 48 y.o. female with oncologic medical history including estrogen receptor positive metastatic breast cancer (07/2020) currently on Abemaciclib .  Palliative ask to see for symptom management and goals of care.  SOCIAL HISTORY:     reports that she has quit smoking. Her smoking use included cigars and cigarettes. She has a 22 pack-year smoking history. She has never used smokeless tobacco. She reports that she does not currently use alcohol. She reports that she does not currently use drugs after having used the following drugs: Marijuana.  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  MEDICATIONS:  Current Outpatient Medications  Medication Sig Dispense Refill   abemaciclib  (VERZENIO ) 50 MG tablet Take 1 tablet (50 mg total) by mouth 2 (two) times daily. Swallow tablets whole. Do not chew, crush, or split tablets before swallowing. 56 tablet 5   ACCU-CHEK GUIDE test strip USE TO MONITOR BLOOD GLUCOSE 3 TIME(S) DAILY     Baclofen  5 MG TABS Take 3 tablets (15 mg total) by mouth 3 (three) times daily. 180 tablet 2   cetirizine  (ZYRTEC ) 10 MG tablet Take 1 tablet (10 mg total) by mouth at bedtime for 10 days. 10 tablet 0   CREON 24000-76000 units CPEP Take by mouth. Take 2 capsules by mouth 3 times daily with meals. Take 2 capsules with meals and 1 with snacks     glipiZIDE  (GLUCOTROL  XL) 2.5 MG 24 hr tablet Take 1 tablet (2.5 mg total) by mouth daily. 30  tablet 1   glipiZIDE  (GLUCOTROL  XL) 2.5 MG 24 hr tablet Take 1 tablet (2.5 mg total) by mouth daily. 30 tablet 2   glipiZIDE  (GLUCOTROL  XL) 2.5 MG 24 hr tablet Take 1 tablet (2.5 mg total) by mouth daily. 90 tablet 1   Glucose Blood (BLOOD GLUCOSE TEST STRIPS) STRP Use to check blood sugar as directed 100 each 5   hydrocortisone  (ANUSOL -HC) 2.5 % rectal cream Apply rectally 2 times daily 30 g 1   [START ON 07/20/2024] HYDROmorphone  (DILAUDID ) 2 MG tablet Take 1 tablet (2 mg total) by mouth  every 6 (six) hours as needed for severe pain (pain score 7-10). 60 tablet 0   letrozole  (FEMARA ) 2.5 MG tablet Take 1 tablet (2.5 mg total) by mouth daily. 90 tablet 3   ondansetron  (ZOFRAN -ODT) 8 MG disintegrating tablet Dissolve 1 tablet (8 mg total) by mouth every 8 (eight) hours as needed for nausea or vomiting. 60 tablet 4   pantoprazole  (PROTONIX ) 40 MG tablet Take 1 tablet (40 mg total) by mouth daily. 30 tablet 11   potassium chloride  SA (KLOR-CON  M) 20 MEQ tablet Take 1 tablet (20 mEq total) by mouth 2 (two) times daily for 7 days. 14 tablet 0   pregabalin  (LYRICA ) 200 MG capsule Take 1 capsule (200 mg total) by mouth 2 (two) times daily. 60 capsule 2   sucralfate  (CARAFATE ) 1 GM/10ML suspension Take 10 mLs (1 g total) by mouth 4 (four) times daily before meals and nightly 1200 mL 11   sucralfate  (CARAFATE ) 1 GM/10ML suspension Take 10 mLs (1 g total) by mouth 4 (four) times daily -  before meals and at bedtime. 1419 mL 11   tiZANidine  (ZANAFLEX ) 4 MG tablet Take 1 tablet (4 mg total) by mouth every 8 (eight) hours as needed for muscle spasms. 30 tablet 3   triamcinolone  cream (KENALOG ) 0.1 % Apply 1 Application topically 2 (two) times daily as needed. 40 g 1   triamcinolone  cream (KENALOG ) 0.1 % Apply 1 Application topically 2 (two) times daily to rash. 30 g 0   venlafaxine  XR (EFFEXOR  XR) 75 MG 24 hr capsule Take 1 capsule (75 mg total) by mouth daily with breakfast. 90 capsule 3   venlafaxine  XR (EFFEXOR -XR) 75 MG 24 hr capsule Take 2 capsules (150 mg total) by mouth daily. 180 capsule 1   Current Facility-Administered Medications  Medication Dose Route Frequency Provider Last Rate Last Admin   0.9 %  sodium chloride  infusion  500 mL Intravenous Continuous Federico Rosario BROCKS, MD        VITAL SIGNS: There were no vitals taken for this visit. There were no vitals filed for this visit.   Estimated body mass index is 28.25 kg/m as calculated from the following:   Height as of 07/07/24:  5' 4 (1.626 m).   Weight as of 07/07/24: 164 lb 9.6 oz (74.7 kg).   PERFORMANCE STATUS (ECOG) : 1 - Symptomatic but completely ambulatory   Discussed the use of AI scribe software for clinical note transcription with the patient, who gave verbal consent to proceed. History of Present Illness Melody Rodriguez is a 48 year old female who I connected with by phone for symptom management follow-up. No acute distress. Denies concerns of nausea, vomiting. No new issues with constipation or diarrhea, noting that her bowel habits are variable but stable.   We discussed her pain at length.  experiences persistent lower back and groin pain, describing it as a 'catch'. She has been  using baclofen  three times a day as a muscle relaxer but finds it ineffective and is seeking alternative options. For pain management, she is on Dilaudid  2 mg and is satisfied with the current dosage as it does not cause excessive drowsiness. We discussed use of tizanidine  including efficacy, administration, and potential side effects.   All questions answered and support provided.   She experiences ongoing body aches, describing them as 'achy', and notes swelling in her hands. She is uncertain if the swelling is related to her medication regimen.  Her current medications include tizanidine  and Dilaudid  2 mg as needed. She has been on Lyrica  150 mg twice daily but feels it is not as effective as it used to be, possibly due to long-term use for her neuropathic pain. We will increase to 200mg  twice daily.   We will continue to closely monitor and support. All questions answered and support provided.    I discussed the importance of continued conversation with family and their medical providers regarding overall plan of care and treatment options, ensuring decisions are within the context of the patients values and GOCs. Assessment & Plan Metastatic breast cancer Recent scans show no progression of the disease per patient  reports of recent oncology updates, and she is tolerating the treatment well. - Continue Verzenio  50 mg twice a day - Continue Femara  (letrozole ) 2.5 mg daily - Schedule monthly appointments for fluid administration  Chronic pain syndrome Chronic pain in the lower back and groin area is not adequately managed with the current regimen of baclofen . She is also on Dilaudid  2 mg for pain management.  -Continue tizanidine  4mg  three times daily as needed.  - Continue Dilaudid  2 mg as needed -Increase Lyrica  to 200mg  twice daily.  Experiencing hot flashes, which are being managed with Effexor  75 mg. Symptoms could be improved. - Continue Effexor  75 mg as managed by Oncology.   Neuropathic pain Currently taking Lyrica , with the highest dose being 200 mg, but reports it is not effective, possibly due to long-term use. - Adjust Lyrica  dosage at the time of the next refill. - Send a message to the provider before picking up the new prescription to ensure the dosage adjustment is made.  I will plan to see her back in 6-8 weeks.  Sooner if needed.   Patient expressed understanding and was in agreement with this plan. She also understands that She can call the clinic at any time with any questions, concerns, or complaints.   Any controlled substances utilized were prescribed in the context of palliative care. PDMP has been reviewed.   I personally spent a total of 25 minutes in the care of the patient today including preparing to see the patient, getting/reviewing separately obtained history, counseling and educating, placing orders, documenting clinical information in the EHR, and coordinating care. Visit consisted of counseling and education dealing with the complex and emotionally intense issues of symptom management and palliative care in the setting of serious and potentially life-threatening illness.  Levon Borer, AGPCNP-BC  Palliative Medicine Team/[facility]

## 2024-07-17 ENCOUNTER — Other Ambulatory Visit: Payer: Self-pay

## 2024-07-17 ENCOUNTER — Other Ambulatory Visit (HOSPITAL_COMMUNITY): Payer: Self-pay

## 2024-07-18 ENCOUNTER — Other Ambulatory Visit: Payer: Self-pay | Admitting: Nurse Practitioner

## 2024-07-18 ENCOUNTER — Other Ambulatory Visit: Payer: Self-pay

## 2024-07-18 ENCOUNTER — Other Ambulatory Visit (HOSPITAL_COMMUNITY): Payer: Self-pay

## 2024-07-18 DIAGNOSIS — Z170 Estrogen receptor positive status [ER+]: Secondary | ICD-10-CM

## 2024-07-18 DIAGNOSIS — Z515 Encounter for palliative care: Secondary | ICD-10-CM

## 2024-07-18 DIAGNOSIS — G893 Neoplasm related pain (acute) (chronic): Secondary | ICD-10-CM

## 2024-07-18 MED ORDER — BACLOFEN 5 MG PO TABS
15.0000 mg | ORAL_TABLET | Freq: Three times a day (TID) | ORAL | 2 refills | Status: DC
  Filled 2024-07-28: qty 270, 30d supply, fill #0
  Filled 2024-08-21: qty 270, 30d supply, fill #1

## 2024-07-20 ENCOUNTER — Other Ambulatory Visit (HOSPITAL_COMMUNITY): Payer: Self-pay

## 2024-07-23 ENCOUNTER — Other Ambulatory Visit: Payer: Self-pay

## 2024-07-23 ENCOUNTER — Other Ambulatory Visit (HOSPITAL_COMMUNITY): Payer: Self-pay

## 2024-07-23 ENCOUNTER — Other Ambulatory Visit: Payer: Self-pay | Admitting: Nurse Practitioner

## 2024-07-23 MED ORDER — PANTOPRAZOLE SODIUM 40 MG PO TBEC
40.0000 mg | DELAYED_RELEASE_TABLET | Freq: Every day | ORAL | 11 refills | Status: AC
  Filled 2024-07-23 – 2024-07-28 (×3): qty 30, 30d supply, fill #0
  Filled 2024-08-21: qty 30, 30d supply, fill #1
  Filled 2024-09-23: qty 30, 30d supply, fill #2
  Filled 2024-10-22: qty 30, 30d supply, fill #3

## 2024-07-24 ENCOUNTER — Other Ambulatory Visit (HOSPITAL_COMMUNITY): Payer: Self-pay

## 2024-07-24 ENCOUNTER — Other Ambulatory Visit: Payer: Self-pay

## 2024-07-25 ENCOUNTER — Other Ambulatory Visit (HOSPITAL_COMMUNITY): Payer: Self-pay

## 2024-07-28 ENCOUNTER — Other Ambulatory Visit: Payer: Self-pay

## 2024-07-28 NOTE — Progress Notes (Signed)
 Specialty Pharmacy Refill Coordination Note  Melody Rodriguez is a 48 y.o. female contacted today regarding refills of specialty medication(s) Abemaciclib  (VERZENIO )   Patient requested Delivery   Delivery date: 08/01/24   Verified address: [address] [place] [address]   Medication will be filled on: 07/31/24

## 2024-07-29 ENCOUNTER — Other Ambulatory Visit (HOSPITAL_COMMUNITY): Payer: Self-pay

## 2024-07-29 ENCOUNTER — Other Ambulatory Visit: Payer: Self-pay

## 2024-07-29 MED ORDER — PANTOPRAZOLE SODIUM 40 MG PO TBEC
40.0000 mg | DELAYED_RELEASE_TABLET | Freq: Every day | ORAL | 11 refills | Status: AC
  Filled 2024-07-29 – 2024-09-23 (×2): qty 30, 30d supply, fill #0

## 2024-07-30 ENCOUNTER — Other Ambulatory Visit: Payer: Self-pay

## 2024-07-31 ENCOUNTER — Other Ambulatory Visit (HOSPITAL_COMMUNITY): Payer: Self-pay | Admitting: *Deleted

## 2024-07-31 ENCOUNTER — Other Ambulatory Visit (HOSPITAL_COMMUNITY): Payer: Self-pay | Admitting: Hematology and Oncology

## 2024-07-31 ENCOUNTER — Other Ambulatory Visit: Payer: Self-pay

## 2024-07-31 DIAGNOSIS — E860 Dehydration: Secondary | ICD-10-CM | POA: Insufficient documentation

## 2024-08-01 ENCOUNTER — Inpatient Hospital Stay

## 2024-08-01 ENCOUNTER — Inpatient Hospital Stay (HOSPITAL_COMMUNITY): Admission: RE | Admit: 2024-08-01 | Source: Ambulatory Visit

## 2024-08-01 ENCOUNTER — Other Ambulatory Visit (HOSPITAL_COMMUNITY): Payer: Self-pay

## 2024-08-05 ENCOUNTER — Other Ambulatory Visit: Payer: Self-pay

## 2024-08-06 ENCOUNTER — Inpatient Hospital Stay (HOSPITAL_COMMUNITY): Admission: RE | Admit: 2024-08-06 | Source: Ambulatory Visit

## 2024-08-12 ENCOUNTER — Other Ambulatory Visit: Payer: Self-pay

## 2024-08-13 ENCOUNTER — Other Ambulatory Visit: Payer: Self-pay

## 2024-08-19 ENCOUNTER — Other Ambulatory Visit (HOSPITAL_COMMUNITY): Payer: Self-pay

## 2024-08-20 ENCOUNTER — Other Ambulatory Visit: Payer: Self-pay | Admitting: Nurse Practitioner

## 2024-08-20 DIAGNOSIS — Z515 Encounter for palliative care: Secondary | ICD-10-CM

## 2024-08-20 DIAGNOSIS — G893 Neoplasm related pain (acute) (chronic): Secondary | ICD-10-CM

## 2024-08-20 DIAGNOSIS — C50411 Malignant neoplasm of upper-outer quadrant of right female breast: Secondary | ICD-10-CM

## 2024-08-20 MED ORDER — HYDROMORPHONE HCL 2 MG PO TABS
2.0000 mg | ORAL_TABLET | Freq: Four times a day (QID) | ORAL | 0 refills | Status: DC | PRN
  Filled 2024-08-20: qty 60, 15d supply, fill #0

## 2024-08-21 ENCOUNTER — Other Ambulatory Visit: Payer: Self-pay

## 2024-08-21 ENCOUNTER — Other Ambulatory Visit (HOSPITAL_COMMUNITY): Payer: Self-pay

## 2024-08-22 ENCOUNTER — Other Ambulatory Visit: Payer: Self-pay

## 2024-08-22 ENCOUNTER — Other Ambulatory Visit (HOSPITAL_COMMUNITY): Payer: Self-pay

## 2024-08-22 NOTE — Progress Notes (Signed)
 Specialty Pharmacy Ongoing Clinical Assessment Note  Melody Rodriguez is a 48 y.o. female who is being followed by the specialty pharmacy service for RxSp Oncology   Patient's specialty medication(s) reviewed today: Abemaciclib  (VERZENIO )   Missed doses in the last 4 weeks: 0   Patient/Caregiver did not have any additional questions or concerns.   Therapeutic benefit summary: Patient is achieving benefit   Adverse events/side effects summary: No adverse events/side effects   Patient's therapy is appropriate to: Continue    Goals Addressed             This Visit's Progress    Maintain optimal adherence to therapy   On track    Patient is on track. Patient will maintain adherence         Follow up: 3 months  [facility] Specialty Pharmacist

## 2024-08-22 NOTE — Progress Notes (Signed)
 Specialty Pharmacy Refill Coordination Note  Melody Rodriguez is a 48 y.o. female contacted today regarding refills of specialty medication(s) Abemaciclib  (VERZENIO )   Patient requested Delivery   Delivery date: 08/27/24   Verified address: [address] [place] [address]   Medication will be filled on: 08/26/24

## 2024-08-26 ENCOUNTER — Other Ambulatory Visit: Payer: Self-pay

## 2024-08-26 ENCOUNTER — Other Ambulatory Visit (HOSPITAL_COMMUNITY): Payer: Self-pay

## 2024-08-27 ENCOUNTER — Ambulatory Visit (HOSPITAL_COMMUNITY): Admission: RE | Admit: 2024-08-27 | Discharge: 2024-08-27 | Attending: Hematology and Oncology

## 2024-08-27 DIAGNOSIS — Z170 Estrogen receptor positive status [ER+]: Secondary | ICD-10-CM | POA: Diagnosis present

## 2024-08-27 DIAGNOSIS — C50411 Malignant neoplasm of upper-outer quadrant of right female breast: Secondary | ICD-10-CM | POA: Diagnosis present

## 2024-08-27 MED ORDER — HEPARIN SOD (PORK) LOCK FLUSH 100 UNIT/ML IV SOLN
INTRAVENOUS | Status: AC
  Filled 2024-08-27: qty 5

## 2024-08-27 MED ORDER — HEPARIN SOD (PORK) LOCK FLUSH 100 UNIT/ML IV SOLN
500.0000 [IU] | Freq: Once | INTRAVENOUS | Status: AC
  Administered 2024-08-27: 500 [IU] via INTRAVENOUS

## 2024-08-27 MED ORDER — IOHEXOL 300 MG/ML  SOLN
100.0000 mL | Freq: Once | INTRAMUSCULAR | Status: AC | PRN
  Administered 2024-08-27: 100 mL via INTRAVENOUS

## 2024-08-27 MED ORDER — SODIUM CHLORIDE (PF) 0.9 % IJ SOLN
INTRAMUSCULAR | Status: AC
  Filled 2024-08-27: qty 50

## 2024-08-29 ENCOUNTER — Other Ambulatory Visit: Payer: Self-pay | Admitting: *Deleted

## 2024-08-29 DIAGNOSIS — C50411 Malignant neoplasm of upper-outer quadrant of right female breast: Secondary | ICD-10-CM

## 2024-09-01 ENCOUNTER — Inpatient Hospital Stay

## 2024-09-01 ENCOUNTER — Inpatient Hospital Stay: Attending: Hematology and Oncology

## 2024-09-01 ENCOUNTER — Inpatient Hospital Stay (HOSPITAL_BASED_OUTPATIENT_CLINIC_OR_DEPARTMENT_OTHER): Admitting: Nurse Practitioner

## 2024-09-01 DIAGNOSIS — Z515 Encounter for palliative care: Secondary | ICD-10-CM

## 2024-09-01 DIAGNOSIS — C50911 Malignant neoplasm of unspecified site of right female breast: Secondary | ICD-10-CM | POA: Diagnosis not present

## 2024-09-01 DIAGNOSIS — R530 Neoplastic (malignant) related fatigue: Secondary | ICD-10-CM

## 2024-09-01 DIAGNOSIS — Z79899 Other long term (current) drug therapy: Secondary | ICD-10-CM | POA: Insufficient documentation

## 2024-09-01 DIAGNOSIS — G893 Neoplasm related pain (acute) (chronic): Secondary | ICD-10-CM

## 2024-09-01 DIAGNOSIS — Z170 Estrogen receptor positive status [ER+]: Secondary | ICD-10-CM | POA: Insufficient documentation

## 2024-09-01 DIAGNOSIS — F418 Other specified anxiety disorders: Secondary | ICD-10-CM | POA: Insufficient documentation

## 2024-09-01 DIAGNOSIS — Z923 Personal history of irradiation: Secondary | ICD-10-CM | POA: Diagnosis not present

## 2024-09-01 DIAGNOSIS — E119 Type 2 diabetes mellitus without complications: Secondary | ICD-10-CM | POA: Insufficient documentation

## 2024-09-01 DIAGNOSIS — C50411 Malignant neoplasm of upper-outer quadrant of right female breast: Secondary | ICD-10-CM | POA: Insufficient documentation

## 2024-09-01 DIAGNOSIS — Z85028 Personal history of other malignant neoplasm of stomach: Secondary | ICD-10-CM | POA: Diagnosis not present

## 2024-09-01 DIAGNOSIS — Z800 Family history of malignant neoplasm of digestive organs: Secondary | ICD-10-CM | POA: Diagnosis not present

## 2024-09-01 DIAGNOSIS — M792 Neuralgia and neuritis, unspecified: Secondary | ICD-10-CM

## 2024-09-01 DIAGNOSIS — E785 Hyperlipidemia, unspecified: Secondary | ICD-10-CM | POA: Insufficient documentation

## 2024-09-01 DIAGNOSIS — Z87891 Personal history of nicotine dependence: Secondary | ICD-10-CM | POA: Diagnosis not present

## 2024-09-01 DIAGNOSIS — Z7984 Long term (current) use of oral hypoglycemic drugs: Secondary | ICD-10-CM | POA: Insufficient documentation

## 2024-09-01 DIAGNOSIS — Z79811 Long term (current) use of aromatase inhibitors: Secondary | ICD-10-CM | POA: Insufficient documentation

## 2024-09-01 DIAGNOSIS — I10 Essential (primary) hypertension: Secondary | ICD-10-CM | POA: Diagnosis not present

## 2024-09-01 DIAGNOSIS — C773 Secondary and unspecified malignant neoplasm of axilla and upper limb lymph nodes: Secondary | ICD-10-CM | POA: Diagnosis present

## 2024-09-01 LAB — CMP (CANCER CENTER ONLY)
ALT: 22 U/L (ref 0–44)
AST: 20 U/L (ref 15–41)
Albumin: 4.2 g/dL (ref 3.5–5.0)
Alkaline Phosphatase: 80 U/L (ref 38–126)
Anion gap: 7 (ref 5–15)
BUN: 12 mg/dL (ref 6–20)
CO2: 28 mmol/L (ref 22–32)
Calcium: 8.8 mg/dL — ABNORMAL LOW (ref 8.9–10.3)
Chloride: 108 mmol/L (ref 98–111)
Creatinine: 0.74 mg/dL (ref 0.44–1.00)
GFR, Estimated: >60 mL/min (ref >60)
Glucose, Bld: 90 mg/dL (ref 70–99)
Potassium: 4 mmol/L (ref 3.5–5.1)
Sodium: 143 mmol/L (ref 135–145)
Total Bilirubin: 0.3 mg/dL (ref 0.0–1.2)
Total Protein: 6.8 g/dL (ref 6.5–8.1)

## 2024-09-01 LAB — CBC WITH DIFFERENTIAL (CANCER CENTER ONLY)
Abs Immature Granulocytes: 0.01 K/uL (ref 0.00–0.07)
Basophils Absolute: 0.1 K/uL (ref 0.0–0.1)
Basophils Relative: 1 %
Eosinophils Absolute: 0.1 K/uL (ref 0.0–0.5)
Eosinophils Relative: 3 %
HCT: 34.3 % — ABNORMAL LOW (ref 36.0–46.0)
Hemoglobin: 11.9 g/dL — ABNORMAL LOW (ref 12.0–15.0)
Immature Granulocytes: 0 %
Lymphocytes Relative: 36 %
Lymphs Abs: 1.5 K/uL (ref 0.7–4.0)
MCH: 33.6 pg (ref 26.0–34.0)
MCHC: 34.7 g/dL (ref 30.0–36.0)
MCV: 96.9 fL (ref 80.0–100.0)
Monocytes Absolute: 0.2 K/uL (ref 0.1–1.0)
Monocytes Relative: 5 %
Neutro Abs: 2.2 K/uL (ref 1.7–7.7)
Neutrophils Relative %: 55 %
Platelet Count: 130 K/uL — ABNORMAL LOW (ref 150–400)
RBC: 3.54 MIL/uL — ABNORMAL LOW (ref 3.87–5.11)
RDW: 12.7 % (ref 11.5–15.5)
WBC Count: 4 K/uL (ref 4.0–10.5)
nRBC: 0 % (ref 0.0–0.2)

## 2024-09-01 MED ORDER — SODIUM CHLORIDE 0.9 % IV SOLN: Freq: Once | INTRAVENOUS | Status: AC

## 2024-09-01 NOTE — Patient Instructions (Signed)

## 2024-09-02 ENCOUNTER — Encounter: Payer: Self-pay | Admitting: Hematology and Oncology

## 2024-09-02 ENCOUNTER — Encounter: Payer: Self-pay | Admitting: Nurse Practitioner

## 2024-09-02 ENCOUNTER — Encounter (HOSPITAL_COMMUNITY): Payer: Self-pay | Admitting: Hematology and Oncology

## 2024-09-02 LAB — CANCER ANTIGEN 27.29: CA 27.29: 38.8 U/mL — ABNORMAL HIGH (ref 0.0–38.6)

## 2024-09-02 NOTE — Progress Notes (Signed)
 Palliative Medicine [facility]  Telephone:([phone]) [phone] [phone]   Name: Melody Rodriguez Date: 09/02/2024 MRN: [id number]  DOB: June 17, 1976  Patient Care Team: Mavis Redge SAILOR, FNP as PCP - General (Nurse Practitioner) Vernetta Berg, MD as Consulting Physician (General Surgery) Odean Potts, MD as Consulting Physician (Hematology and Oncology) Shannon Agent, MD as Consulting Physician (Radiation Oncology) Pickenpack-Cousar, Fannie SAILOR, NP as Nurse Practitioner (Hospice and Palliative Medicine)   INTERVAL HISTORY: Melody Rodriguez is a 48 y.o. female with oncologic medical history including estrogen receptor positive metastatic breast cancer (07/2020) currently on Abemaciclib .  Palliative ask to see for symptom management and goals of care.  SOCIAL HISTORY:     reports that she has quit smoking. Her smoking use included cigars and cigarettes. She has a 22 pack-year smoking history. She has never used smokeless tobacco. She reports that she does not currently use alcohol. She reports that she does not currently use drugs after having used the following drugs: Marijuana.  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  MEDICATIONS:  Current Outpatient Medications  Medication Sig Dispense Refill   abemaciclib  (VERZENIO ) 50 MG tablet Take 1 tablet (50 mg total) by mouth 2 (two) times daily. Swallow tablets whole. Do not chew, crush, or split tablets before swallowing. 56 tablet 5   ACCU-CHEK GUIDE test strip USE TO MONITOR BLOOD GLUCOSE 3 TIME(S) DAILY     Baclofen  5 MG TABS Take 3 tablets (15 mg total) by mouth 3 (three) times daily. 180 tablet 2   cetirizine  (ZYRTEC ) 10 MG tablet Take 1 tablet (10 mg total) by mouth at bedtime for 10 days. 10 tablet 0   CREON 24000-76000 units CPEP Take by mouth. Take 2 capsules by mouth 3 times daily with meals. Take 2 capsules with meals and 1 with snacks     glipiZIDE  (GLUCOTROL  XL) 2.5 MG 24 hr tablet Take 1 tablet (2.5 mg total) by mouth daily. 30 tablet 1   glipiZIDE  (GLUCOTROL  XL) 2.5 MG 24 hr tablet Take 1 tablet (2.5 mg total) by mouth daily. 30 tablet 2   glipiZIDE  (GLUCOTROL  XL) 2.5 MG 24 hr tablet Take 1 tablet (2.5 mg total) by mouth daily. 90 tablet 1   Glucose Blood (BLOOD GLUCOSE TEST STRIPS) STRP Use to check blood sugar as directed 100 each 5   hydrocortisone  (ANUSOL -HC) 2.5 % rectal cream Apply rectally 2 times daily 30 g 1   HYDROmorphone  (DILAUDID ) 2 MG tablet Take 1 tablet (2 mg total) by mouth every 6 (six) hours as needed for severe pain (pain score  7-10). 60 tablet 0   letrozole  (FEMARA ) 2.5 MG tablet Take 1 tablet (2.5 mg total) by mouth daily. 90 tablet 3   ondansetron  (ZOFRAN -ODT) 8 MG disintegrating tablet Dissolve 1 tablet (8 mg total) by mouth every 8 (eight) hours as needed for nausea or vomiting. 60 tablet 4   pantoprazole  (PROTONIX ) 40 MG tablet Take 1 tablet (40 mg total) by mouth daily. 30 tablet 11   pantoprazole  (PROTONIX ) 40 MG tablet Take 1 tablet (40 mg total) by mouth daily. 30 tablet 11   potassium chloride  SA (KLOR-CON  M) 20 MEQ tablet Take 1 tablet (20 mEq total) by mouth 2 (two) times daily for 7 days. 14 tablet 0    pregabalin  (LYRICA ) 200 MG capsule Take 1 capsule (200 mg total) by mouth 2 (two) times daily. 60 capsule 2   sucralfate  (CARAFATE ) 1 GM/10ML suspension Take 10 mLs (1 g total) by mouth 4 (four) times daily before meals and nightly 1200 mL 11   sucralfate  (CARAFATE ) 1 GM/10ML suspension Take 10 mLs (1 g total) by mouth 4 (four) times daily -  before meals and at bedtime. 1419 mL 11   tiZANidine  (ZANAFLEX ) 4 MG tablet Take 1 tablet (4 mg total) by mouth every 8 (eight) hours as needed for muscle spasms. 30 tablet 3   triamcinolone  cream (KENALOG ) 0.1 % Apply 1 Application topically 2 (two) times daily as needed. 40 g 1   triamcinolone  cream (KENALOG ) 0.1 % Apply 1 Application topically 2 (two) times daily to rash. 30 g 0   venlafaxine  XR (EFFEXOR  XR) 75 MG 24 hr capsule Take 1 capsule (75 mg total) by mouth daily with breakfast. 90 capsule 3   venlafaxine  XR (EFFEXOR -XR) 75 MG 24 hr capsule Take 2 capsules (150 mg total) by mouth daily. 180 capsule 1   Current Facility-Administered Medications  Medication Dose Route Frequency Provider Last Rate Last Admin   0.9 %  sodium chloride  infusion  500 mL Intravenous Continuous Federico Rosario BROCKS, MD        VITAL SIGNS: There were no vitals taken for this visit. There were no vitals filed for this visit.   Estimated body mass index is 29.15 kg/m as calculated from the following:   Height as of 07/07/24: 5' 4 (1.626 m).   Weight as of an earlier encounter on 09/01/24: 169 lb 12.8 oz (77 kg).   PERFORMANCE STATUS (ECOG) : 1 - Symptomatic but completely ambulatory   Discussed the use of AI scribe software for clinical note transcription with the patient, who gave verbal consent to proceed. History of Present Illness Melody Rodriguez is a 48 year old female with breast cancer who was seen during infusion for symptom management follow-up. No acute distress. Denies concerns of nausea, vomiting. No new issues with constipation or diarrhea, noting that her  bowel habits are variable but stable.   She is looking forward to spending the holidays with her children. Shares stressors related to extended family however trying to maintain her peace and focus on her immediate family and health.   We discussed her pain at length.  Current regimen is controlling pain overall. Her current medications include tizanidine  and Dilaudid  2 mg as needed. Lyrica  200 mg twice daily. No adjustments to current regimen at this time. We will continue to closely monitor and support. All questions answered and support provided.   All questions answered and support provided.   Assessment & Plan Metastatic breast cancer Recent scans show no progression of the  disease per patient reports of recent oncology updates, and she is tolerating the treatment well. - Continue Verzenio  50 mg twice a day as managed by Oncology.  - Continue Femara  (letrozole ) 2.5 mg daily - Schedule monthly appointments for fluid administration  Chronic pain/Cancer Related syndrome Chronic pain in the lower back and groin area is not adequately managed with the current regimen of baclofen . She is also on Dilaudid  2 mg for pain management.  -Continue tizanidine  4mg  three times daily as needed.  - Continue Dilaudid  2 mg as needed -Continue Lyrica  to 200mg  twice daily.  I will plan to see her back in 6-8 weeks.  Sooner if needed.   Patient expressed understanding and was in agreement with this plan. She also understands that She can call the clinic at any time with any questions, concerns, or complaints.   Any controlled substances utilized were prescribed in the context of palliative care. PDMP has been reviewed.   Visit consisted of counseling and education dealing with the complex and emotionally intense issues of symptom management and palliative care in the setting of serious and potentially life-threatening illness.  Levon Borer, AGPCNP-BC  Palliative Medicine Team/[facility]

## 2024-09-05 ENCOUNTER — Other Ambulatory Visit (HOSPITAL_COMMUNITY): Payer: Self-pay

## 2024-09-05 MED ORDER — HYDROCHLOROTHIAZIDE 25 MG PO TABS
25.0000 mg | ORAL_TABLET | ORAL | 5 refills | Status: AC
  Filled 2024-09-05: qty 30, 30d supply, fill #0
  Filled 2024-10-02 – 2024-10-23 (×2): qty 30, 30d supply, fill #1

## 2024-09-10 ENCOUNTER — Other Ambulatory Visit (HOSPITAL_COMMUNITY): Payer: Self-pay

## 2024-09-12 ENCOUNTER — Other Ambulatory Visit: Payer: Self-pay

## 2024-09-16 ENCOUNTER — Other Ambulatory Visit: Payer: Self-pay

## 2024-09-17 ENCOUNTER — Other Ambulatory Visit: Payer: Self-pay

## 2024-09-19 ENCOUNTER — Other Ambulatory Visit: Payer: Self-pay

## 2024-09-19 ENCOUNTER — Other Ambulatory Visit (HOSPITAL_COMMUNITY): Payer: Self-pay

## 2024-09-19 ENCOUNTER — Encounter: Payer: Self-pay | Admitting: Hematology and Oncology

## 2024-09-19 ENCOUNTER — Encounter (HOSPITAL_COMMUNITY): Payer: Self-pay | Admitting: Hematology and Oncology

## 2024-09-19 MED ORDER — OMRON 3 SERIES BP MONITOR DEVI
0 refills | Status: AC
  Filled 2024-09-19: qty 1, 30d supply, fill #0

## 2024-09-19 NOTE — Progress Notes (Signed)
 Specialty Pharmacy Refill Coordination Note  Melody Rodriguez is a 49 y.o. female contacted today regarding refills of specialty medication(s) Abemaciclib  (VERZENIO )   Patient requested Delivery   Delivery date: 09/24/24   Verified address: [address] [place] [address]   Medication will be filled on: 09/23/24

## 2024-09-20 ENCOUNTER — Other Ambulatory Visit (HOSPITAL_COMMUNITY): Payer: Self-pay

## 2024-09-22 ENCOUNTER — Other Ambulatory Visit: Payer: Self-pay

## 2024-09-23 ENCOUNTER — Other Ambulatory Visit: Payer: Self-pay

## 2024-09-23 ENCOUNTER — Other Ambulatory Visit (HOSPITAL_COMMUNITY): Payer: Self-pay

## 2024-09-23 DIAGNOSIS — Z515 Encounter for palliative care: Secondary | ICD-10-CM

## 2024-09-23 DIAGNOSIS — C50411 Malignant neoplasm of upper-outer quadrant of right female breast: Secondary | ICD-10-CM

## 2024-09-23 DIAGNOSIS — G893 Neoplasm related pain (acute) (chronic): Secondary | ICD-10-CM

## 2024-09-23 MED ORDER — BACLOFEN 5 MG PO TABS
15.0000 mg | ORAL_TABLET | Freq: Three times a day (TID) | ORAL | 2 refills | Status: DC
  Filled 2024-09-23: qty 180, 20d supply, fill #0

## 2024-09-24 ENCOUNTER — Other Ambulatory Visit: Payer: Self-pay

## 2024-09-24 ENCOUNTER — Encounter: Payer: Self-pay | Admitting: Licensed Clinical Social Worker

## 2024-09-24 ENCOUNTER — Other Ambulatory Visit: Payer: Self-pay | Admitting: Nurse Practitioner

## 2024-09-24 ENCOUNTER — Other Ambulatory Visit (HOSPITAL_COMMUNITY): Payer: Self-pay

## 2024-09-24 DIAGNOSIS — Z170 Estrogen receptor positive status [ER+]: Secondary | ICD-10-CM

## 2024-09-24 DIAGNOSIS — M62838 Other muscle spasm: Secondary | ICD-10-CM

## 2024-09-24 DIAGNOSIS — G893 Neoplasm related pain (acute) (chronic): Secondary | ICD-10-CM

## 2024-09-24 DIAGNOSIS — Z515 Encounter for palliative care: Secondary | ICD-10-CM

## 2024-09-24 MED ORDER — BACLOFEN 15 MG PO TABS
15.0000 mg | ORAL_TABLET | Freq: Three times a day (TID) | ORAL | 2 refills | Status: AC
  Filled 2024-09-24: qty 90, 30d supply, fill #0
  Filled 2024-10-22: qty 90, 30d supply, fill #1

## 2024-09-24 MED ORDER — GLIPIZIDE ER 2.5 MG PO TB24
2.5000 mg | ORAL_TABLET | Freq: Every day | ORAL | 1 refills | Status: AC
  Filled 2024-09-24: qty 30, 30d supply, fill #0
  Filled 2024-10-22: qty 30, 30d supply, fill #1

## 2024-09-24 NOTE — Progress Notes (Signed)
 [facility] CSW Progress Note  Clinical Child Psychotherapist received e-mail from patient asking about prescription assistance programs to help with some medication expenses.    Interventions: Provided information on [employer] (through [facility]), PAF, PAN, [employer], and [employer]. Depending on which prescriptions, shared that some manufacturers may offer assistance Updated on when pt will be re-eligible for [employer] (end of January)      Follow Up Plan:  Patient will contact CSW with any support or resource needs    Melody Rodriguez E Eara Burruel, LCSW Clinical Social Worker [facility]

## 2024-09-25 ENCOUNTER — Other Ambulatory Visit: Payer: Self-pay

## 2024-09-25 ENCOUNTER — Other Ambulatory Visit (HOSPITAL_COMMUNITY): Payer: Self-pay

## 2024-10-02 ENCOUNTER — Inpatient Hospital Stay: Attending: Hematology and Oncology

## 2024-10-02 ENCOUNTER — Other Ambulatory Visit: Payer: Self-pay

## 2024-10-02 ENCOUNTER — Other Ambulatory Visit (HOSPITAL_COMMUNITY): Payer: Self-pay

## 2024-10-02 ENCOUNTER — Encounter (HOSPITAL_COMMUNITY): Payer: Self-pay | Admitting: Hematology and Oncology

## 2024-10-02 ENCOUNTER — Encounter: Payer: Self-pay | Admitting: Hematology and Oncology

## 2024-10-02 ENCOUNTER — Other Ambulatory Visit: Payer: Self-pay | Admitting: Nurse Practitioner

## 2024-10-02 ENCOUNTER — Other Ambulatory Visit: Payer: Self-pay | Admitting: Hematology and Oncology

## 2024-10-02 DIAGNOSIS — Z170 Estrogen receptor positive status [ER+]: Secondary | ICD-10-CM | POA: Diagnosis not present

## 2024-10-02 DIAGNOSIS — Z87891 Personal history of nicotine dependence: Secondary | ICD-10-CM | POA: Insufficient documentation

## 2024-10-02 DIAGNOSIS — C50411 Malignant neoplasm of upper-outer quadrant of right female breast: Secondary | ICD-10-CM

## 2024-10-02 DIAGNOSIS — Z79811 Long term (current) use of aromatase inhibitors: Secondary | ICD-10-CM | POA: Diagnosis not present

## 2024-10-02 DIAGNOSIS — C773 Secondary and unspecified malignant neoplasm of axilla and upper limb lymph nodes: Secondary | ICD-10-CM | POA: Insufficient documentation

## 2024-10-02 DIAGNOSIS — Z515 Encounter for palliative care: Secondary | ICD-10-CM

## 2024-10-02 DIAGNOSIS — G893 Neoplasm related pain (acute) (chronic): Secondary | ICD-10-CM

## 2024-10-02 MED ORDER — SODIUM CHLORIDE 0.9 % IV SOLN: INTRAVENOUS | Status: DC

## 2024-10-02 MED ORDER — HYDROMORPHONE HCL 2 MG PO TABS
2.0000 mg | ORAL_TABLET | Freq: Four times a day (QID) | ORAL | 0 refills | Status: AC | PRN
  Filled 2024-10-02: qty 60, 15d supply, fill #0

## 2024-10-02 MED ORDER — LETROZOLE 2.5 MG PO TABS
2.5000 mg | ORAL_TABLET | Freq: Every day | ORAL | 3 refills | Status: AC
  Filled 2024-10-02: qty 90, 90d supply, fill #0

## 2024-10-02 MED ORDER — SODIUM CHLORIDE 0.9 % IV SOLN
12.5000 mg | Freq: Once | INTRAVENOUS | Status: AC
  Administered 2024-10-02: 12.5 mg via INTRAVENOUS
  Filled 2024-10-02: qty 0.5

## 2024-10-07 ENCOUNTER — Other Ambulatory Visit (HOSPITAL_COMMUNITY): Payer: Self-pay

## 2024-10-08 ENCOUNTER — Encounter: Payer: Self-pay | Admitting: Hematology and Oncology

## 2024-10-08 ENCOUNTER — Encounter (HOSPITAL_COMMUNITY): Payer: Self-pay | Admitting: Hematology and Oncology

## 2024-10-10 ENCOUNTER — Other Ambulatory Visit (HOSPITAL_COMMUNITY): Payer: Self-pay

## 2024-10-10 MED ORDER — SPIRONOLACTONE 25 MG PO TABS
25.0000 mg | ORAL_TABLET | Freq: Every day | ORAL | 0 refills | Status: AC
  Filled 2024-10-10 (×2): qty 30, 30d supply, fill #0

## 2024-10-13 ENCOUNTER — Other Ambulatory Visit: Payer: Self-pay

## 2024-10-14 ENCOUNTER — Encounter (INDEPENDENT_AMBULATORY_CARE_PROVIDER_SITE_OTHER): Payer: Self-pay

## 2024-10-14 ENCOUNTER — Other Ambulatory Visit: Payer: Self-pay

## 2024-10-14 ENCOUNTER — Encounter: Payer: Self-pay | Admitting: Licensed Clinical Social Worker

## 2024-10-14 NOTE — Progress Notes (Signed)
 [facility] CSW Progress Note  Clinical Social Worker submitted Devere KANDICE Grout application on [employer].      Elchanan Bob E Jesiah Grismer, LCSW Clinical Social Worker [facility]

## 2024-10-15 ENCOUNTER — Other Ambulatory Visit (HOSPITAL_COMMUNITY): Payer: Self-pay

## 2024-10-15 ENCOUNTER — Other Ambulatory Visit: Payer: Self-pay | Admitting: Pharmacist

## 2024-10-15 ENCOUNTER — Other Ambulatory Visit: Payer: Self-pay

## 2024-10-15 NOTE — Progress Notes (Signed)
 Clinical Intervention Note  Clinical Intervention Notes: Patient reported starting spironolactone  25mg . No DDI's with Verzenio    Clinical Intervention Outcomes: Prevention of an adverse drug event   Lyle LELON Chalk Specialty Pharmacist

## 2024-10-15 NOTE — Progress Notes (Signed)
 Specialty Pharmacy Refill Coordination Note  Melody Rodriguez is a 49 y.o. female contacted today regarding refills of specialty medication(s) Abemaciclib  (VERZENIO )   Patient requested Delivery   Delivery date: 10/28/24   Verified address: [address] [place] [address]   Medication will be filled on: 10/27/24

## 2024-10-22 ENCOUNTER — Other Ambulatory Visit: Payer: Self-pay

## 2024-10-22 ENCOUNTER — Other Ambulatory Visit (HOSPITAL_COMMUNITY): Payer: Self-pay

## 2024-10-23 ENCOUNTER — Other Ambulatory Visit: Payer: Self-pay

## 2024-10-23 ENCOUNTER — Other Ambulatory Visit (HOSPITAL_COMMUNITY): Payer: Self-pay

## 2024-10-23 MED ORDER — BACLOFEN 5 MG PO TABS
15.0000 mg | ORAL_TABLET | Freq: Three times a day (TID) | ORAL | 1 refills | Status: AC
  Filled 2024-10-23: qty 270, 30d supply, fill #0

## 2024-10-24 ENCOUNTER — Other Ambulatory Visit: Payer: Self-pay

## 2024-11-03 ENCOUNTER — Inpatient Hospital Stay: Attending: Hematology and Oncology

## 2024-11-03 ENCOUNTER — Inpatient Hospital Stay
